# Patient Record
Sex: Female | Born: 1950 | Race: White | Hispanic: No | Smoking: Current every day smoker
Health system: Southern US, Community
[De-identification: ages and names within clinical notes are randomized; demographics above are authoritative.]

## PROBLEM LIST (undated history)

## (undated) DIAGNOSIS — Z923 Personal history of irradiation: Secondary | ICD-10-CM

## (undated) DIAGNOSIS — E063 Autoimmune thyroiditis: Secondary | ICD-10-CM

## (undated) DIAGNOSIS — E119 Type 2 diabetes mellitus without complications: Secondary | ICD-10-CM

## (undated) DIAGNOSIS — E785 Hyperlipidemia, unspecified: Secondary | ICD-10-CM

## (undated) DIAGNOSIS — Z9221 Personal history of antineoplastic chemotherapy: Secondary | ICD-10-CM

## (undated) DIAGNOSIS — Z806 Family history of leukemia: Secondary | ICD-10-CM

## (undated) DIAGNOSIS — I4891 Unspecified atrial fibrillation: Secondary | ICD-10-CM

## (undated) DIAGNOSIS — E038 Other specified hypothyroidism: Secondary | ICD-10-CM

## (undated) DIAGNOSIS — I499 Cardiac arrhythmia, unspecified: Secondary | ICD-10-CM

## (undated) DIAGNOSIS — Z803 Family history of malignant neoplasm of breast: Secondary | ICD-10-CM

## (undated) DIAGNOSIS — Z8042 Family history of malignant neoplasm of prostate: Secondary | ICD-10-CM

## (undated) DIAGNOSIS — I10 Essential (primary) hypertension: Secondary | ICD-10-CM

## (undated) HISTORY — PX: EYE SURGERY: SHX253

## (undated) HISTORY — DX: Unspecified atrial fibrillation: I48.91

## (undated) HISTORY — DX: Other specified hypothyroidism: E03.8

## (undated) HISTORY — DX: Family history of malignant neoplasm of breast: Z80.3

## (undated) HISTORY — PX: APPENDECTOMY: SHX54

## (undated) HISTORY — PX: KNEE SURGERY: SHX244

## (undated) HISTORY — DX: Family history of malignant neoplasm of prostate: Z80.42

## (undated) HISTORY — DX: Hyperlipidemia, unspecified: E78.5

## (undated) HISTORY — DX: Family history of leukemia: Z80.6

## (undated) HISTORY — DX: Essential (primary) hypertension: I10

## (undated) HISTORY — PX: BREAST SURGERY: SHX581

## (undated) HISTORY — DX: Autoimmune thyroiditis: E06.3

## (undated) HISTORY — PX: ABDOMINAL HYSTERECTOMY: SHX81

## (undated) HISTORY — PX: CHOLECYSTECTOMY: SHX55

## (undated) HISTORY — DX: Type 2 diabetes mellitus without complications: E11.9

---

## 1997-08-29 ENCOUNTER — Other Ambulatory Visit: Admission: RE | Admit: 1997-08-29 | Discharge: 1997-08-29 | Payer: Self-pay | Admitting: Obstetrics & Gynecology

## 1998-09-29 ENCOUNTER — Other Ambulatory Visit: Admission: RE | Admit: 1998-09-29 | Discharge: 1998-09-29 | Payer: Self-pay | Admitting: Obstetrics & Gynecology

## 2003-11-11 ENCOUNTER — Ambulatory Visit (HOSPITAL_BASED_OUTPATIENT_CLINIC_OR_DEPARTMENT_OTHER): Admission: RE | Admit: 2003-11-11 | Discharge: 2003-11-11 | Payer: Self-pay | Admitting: Orthopedic Surgery

## 2004-06-09 ENCOUNTER — Other Ambulatory Visit: Admission: RE | Admit: 2004-06-09 | Discharge: 2004-06-09 | Payer: Self-pay | Admitting: Obstetrics & Gynecology

## 2015-02-13 DIAGNOSIS — I480 Paroxysmal atrial fibrillation: Secondary | ICD-10-CM | POA: Insufficient documentation

## 2015-02-13 DIAGNOSIS — E785 Hyperlipidemia, unspecified: Secondary | ICD-10-CM | POA: Insufficient documentation

## 2015-02-13 HISTORY — DX: Hyperlipidemia, unspecified: E78.5

## 2015-02-13 HISTORY — DX: Paroxysmal atrial fibrillation: I48.0

## 2015-05-23 DIAGNOSIS — E119 Type 2 diabetes mellitus without complications: Secondary | ICD-10-CM

## 2015-05-23 DIAGNOSIS — E038 Other specified hypothyroidism: Secondary | ICD-10-CM

## 2015-05-23 DIAGNOSIS — E063 Autoimmune thyroiditis: Secondary | ICD-10-CM

## 2015-05-23 HISTORY — DX: Other specified hypothyroidism: E03.8

## 2015-05-23 HISTORY — DX: Autoimmune thyroiditis: E06.3

## 2015-05-23 HISTORY — DX: Type 2 diabetes mellitus without complications: E11.9

## 2015-08-25 DIAGNOSIS — I10 Essential (primary) hypertension: Secondary | ICD-10-CM

## 2015-08-25 HISTORY — DX: Essential (primary) hypertension: I10

## 2015-12-01 DIAGNOSIS — F331 Major depressive disorder, recurrent, moderate: Secondary | ICD-10-CM | POA: Insufficient documentation

## 2015-12-01 HISTORY — DX: Major depressive disorder, recurrent, moderate: F33.1

## 2016-10-14 ENCOUNTER — Telehealth: Payer: Self-pay | Admitting: Cardiology

## 2016-10-14 DIAGNOSIS — I480 Paroxysmal atrial fibrillation: Secondary | ICD-10-CM

## 2016-10-14 NOTE — Telephone Encounter (Signed)
Order placed. Result will be routed to coumadin clinic once received.

## 2016-10-14 NOTE — Telephone Encounter (Signed)
Patient needs her PT drawn and in [place] and would like it sent to her home to have her husband bring it with him when he goes. She will be going to a [facility] there in [place] .

## 2016-11-02 ENCOUNTER — Telehealth: Payer: Self-pay

## 2016-11-02 LAB — POCT INR: INR: 2.7

## 2016-11-02 NOTE — Telephone Encounter (Signed)
P/c with patient needs standing order for pt/inr to be faxed to [facility] # [phone]. thanks

## 2016-11-02 NOTE — Telephone Encounter (Signed)
Message will be routed to coumadin clinic for management of warfarin.

## 2016-11-02 NOTE — Telephone Encounter (Signed)
Order has been faxed

## 2016-11-08 ENCOUNTER — Ambulatory Visit (INDEPENDENT_AMBULATORY_CARE_PROVIDER_SITE_OTHER): Payer: Self-pay | Admitting: Pharmacist

## 2016-11-08 DIAGNOSIS — Z5181 Encounter for therapeutic drug level monitoring: Secondary | ICD-10-CM

## 2016-11-08 DIAGNOSIS — I4891 Unspecified atrial fibrillation: Secondary | ICD-10-CM | POA: Insufficient documentation

## 2016-11-08 DIAGNOSIS — Z7901 Long term (current) use of anticoagulants: Secondary | ICD-10-CM | POA: Insufficient documentation

## 2016-11-08 HISTORY — DX: Long term (current) use of anticoagulants: Z79.01

## 2016-11-08 HISTORY — DX: Unspecified atrial fibrillation: I48.91

## 2016-11-08 HISTORY — DX: Encounter for therapeutic drug level monitoring: Z51.81

## 2016-11-10 NOTE — Progress Notes (Signed)
Sent to [place], Therapist, sports to address. [place] patient.  Thanks!

## 2016-12-08 DIAGNOSIS — Z789 Other specified health status: Secondary | ICD-10-CM | POA: Insufficient documentation

## 2016-12-08 HISTORY — DX: Other specified health status: Z78.9

## 2016-12-10 ENCOUNTER — Other Ambulatory Visit: Payer: Self-pay | Admitting: Cardiology

## 2016-12-11 LAB — PROTIME-INR
INR: 2.4 — AB (ref 0.8–1.2)
PROTHROMBIN TIME: 24 s — AB (ref 9.1–12.0)

## 2016-12-14 ENCOUNTER — Ambulatory Visit (INDEPENDENT_AMBULATORY_CARE_PROVIDER_SITE_OTHER): Payer: BLUE CROSS/BLUE SHIELD | Admitting: Cardiology

## 2016-12-14 DIAGNOSIS — Z7901 Long term (current) use of anticoagulants: Secondary | ICD-10-CM | POA: Diagnosis not present

## 2016-12-14 DIAGNOSIS — Z5181 Encounter for therapeutic drug level monitoring: Secondary | ICD-10-CM | POA: Diagnosis not present

## 2016-12-14 DIAGNOSIS — I4891 Unspecified atrial fibrillation: Secondary | ICD-10-CM

## 2017-01-25 ENCOUNTER — Other Ambulatory Visit: Payer: Self-pay | Admitting: Cardiology

## 2017-01-25 LAB — PROTIME-INR: INR: 4.1 — AB (ref <=1.1)

## 2017-01-26 ENCOUNTER — Ambulatory Visit (INDEPENDENT_AMBULATORY_CARE_PROVIDER_SITE_OTHER): Payer: BLUE CROSS/BLUE SHIELD

## 2017-01-26 ENCOUNTER — Telehealth: Payer: Self-pay | Admitting: Cardiology

## 2017-01-26 DIAGNOSIS — I4891 Unspecified atrial fibrillation: Secondary | ICD-10-CM | POA: Diagnosis not present

## 2017-01-26 DIAGNOSIS — Z5181 Encounter for therapeutic drug level monitoring: Secondary | ICD-10-CM

## 2017-01-26 DIAGNOSIS — Z7901 Long term (current) use of anticoagulants: Secondary | ICD-10-CM

## 2017-01-26 LAB — PROTIME-INR
INR: 4.1 — ABNORMAL HIGH (ref 0.8–1.2)
Prothrombin Time: 39.4 s — ABNORMAL HIGH (ref 9.1–12.0)

## 2017-01-26 NOTE — Telephone Encounter (Signed)
Patient is wanting to know if she can have a standing order for her PT to be drawn at [facility] in [place] so she doesn't ave to go to [facility].Marland Kitchen

## 2017-01-27 NOTE — Telephone Encounter (Signed)
Message has been sent to the coumadin clinic for further advise.

## 2017-02-10 ENCOUNTER — Other Ambulatory Visit: Payer: Self-pay

## 2017-02-10 DIAGNOSIS — I480 Paroxysmal atrial fibrillation: Secondary | ICD-10-CM

## 2017-02-18 LAB — PROTIME-INR
INR: 2.1 — ABNORMAL HIGH (ref 0.8–1.2)
Prothrombin Time: 20.8 s — ABNORMAL HIGH (ref 9.1–12.0)

## 2017-02-21 ENCOUNTER — Ambulatory Visit (INDEPENDENT_AMBULATORY_CARE_PROVIDER_SITE_OTHER): Payer: BLUE CROSS/BLUE SHIELD | Admitting: Pharmacist

## 2017-02-21 DIAGNOSIS — I4891 Unspecified atrial fibrillation: Secondary | ICD-10-CM

## 2017-02-21 DIAGNOSIS — Z7901 Long term (current) use of anticoagulants: Secondary | ICD-10-CM | POA: Diagnosis not present

## 2017-02-21 DIAGNOSIS — Z5181 Encounter for therapeutic drug level monitoring: Secondary | ICD-10-CM

## 2017-03-09 ENCOUNTER — Other Ambulatory Visit: Payer: Self-pay | Admitting: Cardiology

## 2017-03-09 MED ORDER — WARFARIN SODIUM 10 MG PO TABS: ORAL_TABLET | ORAL | 0 refills | Status: DC

## 2017-03-09 MED ORDER — WARFARIN SODIUM 10 MG PO TABS: ORAL_TABLET | ORAL | 1 refills | Status: DC

## 2017-03-09 NOTE — Telephone Encounter (Signed)
Sent in rx to mail order pharmacy and local pharmacy as requested.

## 2017-03-09 NOTE — Telephone Encounter (Signed)
Routed to coumadin clinic

## 2017-03-09 NOTE — Telephone Encounter (Signed)
°*  STAT* If patient is at the pharmacy, call can be transferred to refill team.   1. Which medications need to be refilled? (please list name of each medication and dose if known) Warfarin 10mg   2. Which pharmacy/location (including street and city if local pharmacy) is medication to be sent to? [facility]   3. Do they need a 30 day or 90 day supply? 90  Patient not due to see RJK til the Spring of 2019. [facility] was supposed to send refill but must have sent to the old office.    PLEASE CALL in SOME Warfarin to last 15 days to [facility]!! She is completely out!

## 2017-03-28 ENCOUNTER — Other Ambulatory Visit: Payer: Self-pay | Admitting: Cardiology

## 2017-03-29 ENCOUNTER — Other Ambulatory Visit: Payer: Self-pay

## 2017-03-29 ENCOUNTER — Telehealth: Payer: Self-pay | Admitting: *Deleted

## 2017-03-29 DIAGNOSIS — I4891 Unspecified atrial fibrillation: Secondary | ICD-10-CM

## 2017-03-29 DIAGNOSIS — Z7901 Long term (current) use of anticoagulants: Secondary | ICD-10-CM

## 2017-03-29 NOTE — Telephone Encounter (Signed)
Spoke with pt and asked if she had been able to have INR done that it was scheduled to be done on 03/14/2017  She states that she went to lab and they said did not have an order.This nurse called [place] office and spoke with [employer] and she states she will get Dr Agustin Cree to send [facility] so that she can get INR done tomorrow. This nurse called pt and she states she will go tomorrow to get INR checked

## 2017-03-31 LAB — PROTIME-INR
INR: 3.5 — AB (ref <=1.1)
INR: 3.5 — ABNORMAL HIGH (ref 0.8–1.2)
PROTHROMBIN TIME: 35.1 s — AB (ref 9.1–12.0)

## 2017-04-06 ENCOUNTER — Ambulatory Visit (INDEPENDENT_AMBULATORY_CARE_PROVIDER_SITE_OTHER): Payer: BLUE CROSS/BLUE SHIELD

## 2017-04-06 ENCOUNTER — Telehealth: Payer: Self-pay | Admitting: Pharmacist

## 2017-04-06 DIAGNOSIS — I4891 Unspecified atrial fibrillation: Secondary | ICD-10-CM

## 2017-04-06 DIAGNOSIS — Z7901 Long term (current) use of anticoagulants: Secondary | ICD-10-CM

## 2017-04-06 DIAGNOSIS — Z5181 Encounter for therapeutic drug level monitoring: Secondary | ICD-10-CM | POA: Diagnosis not present

## 2017-04-06 NOTE — Telephone Encounter (Signed)
Form signed and faxed back. Sent to medical records for scanning into system.

## 2017-04-06 NOTE — Patient Instructions (Signed)
Called spoke with pt, advised to skip today's dosage of Coumadin, then resume warfarin 10mg  daily. Recheck INR in 2 weeks.  ([facility])

## 2017-04-06 NOTE — Telephone Encounter (Signed)
Spoke with Sabrina [name], [facility] representative and verified active standing order on file until June 2019. She states that once order is active they will need signature on their form faxed back. She will send form to my attention to be signed and faxed back.

## 2017-04-20 ENCOUNTER — Other Ambulatory Visit: Payer: Self-pay | Admitting: Cardiology

## 2017-04-20 ENCOUNTER — Ambulatory Visit (INDEPENDENT_AMBULATORY_CARE_PROVIDER_SITE_OTHER): Payer: BLUE CROSS/BLUE SHIELD | Admitting: Internal Medicine

## 2017-04-20 DIAGNOSIS — I4891 Unspecified atrial fibrillation: Secondary | ICD-10-CM | POA: Diagnosis not present

## 2017-04-20 DIAGNOSIS — Z5181 Encounter for therapeutic drug level monitoring: Secondary | ICD-10-CM

## 2017-04-20 DIAGNOSIS — Z7901 Long term (current) use of anticoagulants: Secondary | ICD-10-CM

## 2017-04-20 LAB — PROTIME-INR
INR: 3 — ABNORMAL HIGH (ref 0.8–1.2)
PROTHROMBIN TIME: 31 s — AB (ref 9.1–12.0)

## 2017-05-13 ENCOUNTER — Ambulatory Visit (INDEPENDENT_AMBULATORY_CARE_PROVIDER_SITE_OTHER): Payer: BLUE CROSS/BLUE SHIELD | Admitting: Pharmacist

## 2017-05-13 ENCOUNTER — Other Ambulatory Visit: Payer: Self-pay | Admitting: Cardiology

## 2017-05-13 DIAGNOSIS — Z7901 Long term (current) use of anticoagulants: Secondary | ICD-10-CM | POA: Diagnosis not present

## 2017-05-13 DIAGNOSIS — I4891 Unspecified atrial fibrillation: Secondary | ICD-10-CM | POA: Diagnosis not present

## 2017-05-13 DIAGNOSIS — Z5181 Encounter for therapeutic drug level monitoring: Secondary | ICD-10-CM

## 2017-05-13 LAB — PROTIME-INR
INR: 2.7 — ABNORMAL HIGH (ref 0.8–1.2)
Prothrombin Time: 28 s — ABNORMAL HIGH (ref 9.1–12.0)

## 2017-06-21 ENCOUNTER — Other Ambulatory Visit: Payer: Self-pay | Admitting: Cardiology

## 2017-06-21 ENCOUNTER — Ambulatory Visit (INDEPENDENT_AMBULATORY_CARE_PROVIDER_SITE_OTHER): Payer: BLUE CROSS/BLUE SHIELD | Admitting: Interventional Cardiology

## 2017-06-21 DIAGNOSIS — Z5181 Encounter for therapeutic drug level monitoring: Secondary | ICD-10-CM | POA: Diagnosis not present

## 2017-06-21 DIAGNOSIS — I4891 Unspecified atrial fibrillation: Secondary | ICD-10-CM

## 2017-06-21 DIAGNOSIS — Z7901 Long term (current) use of anticoagulants: Secondary | ICD-10-CM

## 2017-06-21 LAB — PROTIME-INR
INR: 3.2 — ABNORMAL HIGH (ref 0.8–1.2)
PROTHROMBIN TIME: 33.2 s — AB (ref 9.1–12.0)

## 2017-06-21 NOTE — Patient Instructions (Signed)
Description   Spoke with pt do not take coumadin today March 12th then  continue same dose of  warfarin 10mg  daily. Recheck INR in 2 weeks.  ([facility])

## 2017-07-04 ENCOUNTER — Other Ambulatory Visit: Payer: Self-pay | Admitting: Cardiology

## 2017-07-04 LAB — PROTIME-INR: INR: 3.8 — AB (ref 0.9–1.1)

## 2017-07-05 ENCOUNTER — Ambulatory Visit (INDEPENDENT_AMBULATORY_CARE_PROVIDER_SITE_OTHER): Payer: BLUE CROSS/BLUE SHIELD | Admitting: Interventional Cardiology

## 2017-07-05 DIAGNOSIS — I4891 Unspecified atrial fibrillation: Secondary | ICD-10-CM | POA: Diagnosis not present

## 2017-07-05 DIAGNOSIS — Z7901 Long term (current) use of anticoagulants: Secondary | ICD-10-CM

## 2017-07-05 DIAGNOSIS — Z5181 Encounter for therapeutic drug level monitoring: Secondary | ICD-10-CM | POA: Diagnosis not present

## 2017-07-05 LAB — PROTIME-INR
INR: 3.8 — ABNORMAL HIGH (ref 0.8–1.2)
Prothrombin Time: 36.8 s — ABNORMAL HIGH (ref 9.1–12.0)

## 2017-07-12 ENCOUNTER — Encounter: Payer: Self-pay | Admitting: Cardiology

## 2017-07-12 ENCOUNTER — Ambulatory Visit: Payer: BLUE CROSS/BLUE SHIELD | Admitting: Cardiology

## 2017-07-12 ENCOUNTER — Encounter: Payer: Self-pay | Admitting: *Deleted

## 2017-07-12 VITALS — BP 134/70 | HR 68 | Ht 70.0 in | Wt 182.0 lb

## 2017-07-12 DIAGNOSIS — Z789 Other specified health status: Secondary | ICD-10-CM

## 2017-07-12 DIAGNOSIS — IMO0001 Reserved for inherently not codable concepts without codable children: Secondary | ICD-10-CM | POA: Insufficient documentation

## 2017-07-12 DIAGNOSIS — Z7901 Long term (current) use of anticoagulants: Secondary | ICD-10-CM | POA: Diagnosis not present

## 2017-07-12 DIAGNOSIS — I10 Essential (primary) hypertension: Secondary | ICD-10-CM | POA: Diagnosis not present

## 2017-07-12 DIAGNOSIS — I480 Paroxysmal atrial fibrillation: Secondary | ICD-10-CM | POA: Diagnosis not present

## 2017-07-12 DIAGNOSIS — E119 Type 2 diabetes mellitus without complications: Secondary | ICD-10-CM

## 2017-07-12 DIAGNOSIS — E785 Hyperlipidemia, unspecified: Secondary | ICD-10-CM

## 2017-07-12 DIAGNOSIS — F17200 Nicotine dependence, unspecified, uncomplicated: Secondary | ICD-10-CM | POA: Insufficient documentation

## 2017-07-12 HISTORY — DX: Reserved for inherently not codable concepts without codable children: IMO0001

## 2017-07-12 HISTORY — DX: Nicotine dependence, unspecified, uncomplicated: F17.200

## 2017-07-12 NOTE — Patient Instructions (Signed)
Medication Instructions:  Your physician recommends that you continue on your current medications as directed. Please refer to the Current Medication list given to you today.   Labwork: None  Testing/Procedures: You had an EKG today.   Your physician has requested that you have an echocardiogram. Echocardiography is a painless test that uses sound waves to create images of your heart. It provides your doctor with information about the size and shape of your heart and how well your heart's chambers and valves are working. This procedure takes approximately one hour. There are no restrictions for this procedure.  Your physician has requested that you have a carotid ultrasound. This test is an ultrasound of the carotid arteries in your neck. It looks at blood flow through these arteries that supply the brain with blood. Allow one hour for this exam. There are no restrictions or special instructions.    Follow-Up: Your physician wants you to follow-up in: 1 year. You will receive a reminder letter in the mail two months in advance. If you don't receive a letter, please call our office to schedule the follow-up appointment.   Any Other Special Instructions Will Be Listed Below (If Applicable).     If you need a refill on your cardiac medications before your next appointment, please call your pharmacy.

## 2017-07-12 NOTE — Progress Notes (Signed)
Cardiology Office Note:    Date:  07/12/2017   ID:  Sabrina [name], DOB Apr 09, 1951, MRN [id number]  PCP:  Patient, No Pcp Per  Cardiologist:  Jenne Campus, MD    Referring MD: No ref. provider found   Chief Complaint  Patient presents with  . Follow-up  Doing well  History of Present Illness:    Sabrina [name] is a 67 y.o. female who is a chronic smoker with proximal atrial fibrillation.  She is anticoagulated with her chads 2 Vascor equals 3.  She takes Coumadin.  We talked again about switching her to a newer agent however she wants to continue with Coumadin.  Sadly she still continues to smoke about 2 packs/day I will have multiple discussion about this she actually was able to quit one time for 3 months but then went back to it and she told me straight that she will not be able to quit.  I told her how important would be to quit but again I do not think she will be able to do it.  Described to have some shortness of breath and fatigue while walking.  It is a springtime and weather is getting better she became a little more active.  Does have any chest pain tightness squeezing pressure burning chest.  Switch primary care physicians she see Dr. Mitzi [name]  now apparently recently he check her cholesterol which was good.  Will call his office to get a copy of it  Past Medical History:  Diagnosis Date  . Atrial fibrillation ([place]) [I48.91] 11/08/2016  . Benign essential hypertension 08/25/2015  . Hyperlipidemia   . Hypothyroidism due to Hashimoto's thyroiditis 05/23/2015      Current Medications: Current Meds  Medication Sig  . digoxin (DIGOX) 0.125 MG tablet Take 125 mcg by mouth daily.  Marland Kitchen escitalopram (LEXAPRO) 10 MG tablet Take 1 tablet by mouth daily.  . flecainide (TAMBOCOR) 100 MG tablet Take 100 mg by mouth 2 (two) times daily.  Marland Kitchen levothyroxine (SYNTHROID, LEVOTHROID) 100 MCG tablet Take 1 tablet by mouth daily.  Marland Kitchen lisinopril (PRINIVIL,ZESTRIL) 10 MG tablet Take 1 tablet by  mouth daily.  . metFORMIN (GLUCOPHAGE) 500 MG tablet Take 1 tablet by mouth daily.  . metoprolol succinate (TOPROL-XL) 50 MG 24 hr tablet Take 1 tablet by mouth daily.  Marland Kitchen warfarin (COUMADIN) 10 MG tablet Take as directed by Coumadin Clinic     Allergies:   Atorvastatin and Meperidine   Social History   Socioeconomic History  . Marital status: Married    Spouse name: Not on file  . Number of children: Not on file  . Years of education: Not on file  . Highest education level: Not on file  Occupational History  . Not on file  Social Needs  . Financial resource strain: Not on file  . Food insecurity:    Worry: Not on file    Inability: Not on file  . Transportation needs:    Medical: Not on file    Non-medical: Not on file  Tobacco Use  . Smoking status: Current Every Day Smoker  . Smokeless tobacco: Never Used  Substance and Sexual Activity  . Alcohol use: Never    Frequency: Never  . Drug use: Never  . Sexual activity: Not on file  Lifestyle  . Physical activity:    Days per week: Not on file    Minutes per session: Not on file  . Stress: Not on file  Relationships  . Social connections:  Talks on phone: Not on file    Gets together: Not on file    Attends religious service: Not on file    Active member of club or organization: Not on file    Attends meetings of clubs or organizations: Not on file    Relationship status: Not on file  Other Topics Concern  . Not on file  Social History Narrative  . Not on file     Family History: The patient's family history includes Alcohol abuse in her maternal aunt, maternal uncle, and paternal uncle; Cancer in her brother, father, maternal uncle, and mother; Diabetes in her maternal uncle, paternal uncle, and son; Hypertension in her maternal aunt and maternal grandmother. ROS:   Please see the history of present illness.    All 14 point review of systems negative except as described per history of present  illness  EKGs/Labs/Other Studies Reviewed:      Recent Labs: No results found for requested labs within last 8760 hours.  Recent Lipid Panel No results found for: CHOL, TRIG, HDL, CHOLHDL, VLDL, LDLCALC, LDLDIRECT  Physical Exam:    VS:  BP 134/70   Pulse 68   Ht 5\' 10"  (1.778 m)   Wt 182 lb (82.6 kg)   SpO2 95%   BMI 26.11 kg/m     Wt Readings from Last 3 Encounters:  07/12/17 182 lb (82.6 kg)     GEN:  Well nourished, well developed in no acute distress HEENT: Normal NECK: No JVD; No carotid bruits LYMPHATICS: No lymphadenopathy CARDIAC: RRR, no murmurs, no rubs, no gallops RESPIRATORY:  Clear to auscultation without rales, wheezing or rhonchi  ABDOMEN: Soft, non-tender, non-distended MUSCULOSKELETAL:  No edema; No deformity  SKIN: Warm and dry LOWER EXTREMITIES: no swelling NEUROLOGIC:  Alert and oriented x 3 PSYCHIATRIC:  Normal affect   ASSESSMENT:    1. Paroxysmal atrial fibrillation (HCC)   2. Benign essential hypertension   3. Type 2 diabetes mellitus without complication, without long-term current use of insulin ([place])   4. Dyslipidemia   5. Long term (current) use of anticoagulants [Z79.01]   6. Statin intolerance   7. Smoking    PLAN:    In order of problems listed above:  1. Paroxysmal atrial fibrillation appears to be maintaining sinus rhythm.  I will ask her to have echocardiogram however she wants to wait until our office in [place] will be open therefore we will schedule her to have it done in about 4 months. 2. Benign essential hypertension doing well from that point of view we will continue present management. 3. Diabetes type 2.  Stable from that point review 4. Dyslipidemia: Intolerant to statin.  Will call primary care physician to get her fasting lipid profile. 5. Long-term anticoagulation: We will continue. 6. Smoking: We spent a great of time talking to her about the importance of taking steps to quit smoking however she demonstrated  she will not be able to do that because of multiple risk factors for coronary artery disease and atherosclerosis I will schedule her to have carotid ultrasounds but again she wants to wait until you back in [place].   Medication Adjustments/Labs and Tests Ordered: Current medicines are reviewed at length with the patient today.  Concerns regarding medicines are outlined above.  No orders of the defined types were placed in this encounter.  Medication changes: No orders of the defined types were placed in this encounter.   Signed, Park Liter, MD, [facility] 07/12/2017 10:14 AM  [facility]

## 2017-07-19 ENCOUNTER — Other Ambulatory Visit: Payer: Self-pay | Admitting: Cardiology

## 2017-07-19 ENCOUNTER — Ambulatory Visit (INDEPENDENT_AMBULATORY_CARE_PROVIDER_SITE_OTHER): Payer: BLUE CROSS/BLUE SHIELD | Admitting: Cardiology

## 2017-07-19 DIAGNOSIS — I480 Paroxysmal atrial fibrillation: Secondary | ICD-10-CM

## 2017-07-19 DIAGNOSIS — Z7901 Long term (current) use of anticoagulants: Secondary | ICD-10-CM

## 2017-07-19 DIAGNOSIS — Z5181 Encounter for therapeutic drug level monitoring: Secondary | ICD-10-CM

## 2017-07-19 LAB — PROTIME-INR
INR: 3 — ABNORMAL HIGH (ref 0.8–1.2)
Prothrombin Time: 30.9 s — ABNORMAL HIGH (ref 9.1–12.0)

## 2017-07-19 NOTE — Patient Instructions (Signed)
Description   Spoke with pt. Instructed to continue  taking 10mg  daily except 5mg  on Wednesdays. Continue   eating 2 servings of leafy green vegetables each week.  Recheck INR in 2 weeks.  ([facility]) Pt is out of town until April 29th

## 2017-08-08 ENCOUNTER — Other Ambulatory Visit: Payer: Self-pay | Admitting: Cardiology

## 2017-08-08 ENCOUNTER — Ambulatory Visit (INDEPENDENT_AMBULATORY_CARE_PROVIDER_SITE_OTHER): Payer: BLUE CROSS/BLUE SHIELD | Admitting: Pharmacist

## 2017-08-08 DIAGNOSIS — Z5181 Encounter for therapeutic drug level monitoring: Secondary | ICD-10-CM | POA: Diagnosis not present

## 2017-08-08 DIAGNOSIS — I480 Paroxysmal atrial fibrillation: Secondary | ICD-10-CM

## 2017-08-08 DIAGNOSIS — Z7901 Long term (current) use of anticoagulants: Secondary | ICD-10-CM | POA: Diagnosis not present

## 2017-08-08 LAB — PROTIME-INR
INR: 3.9 — AB (ref 0.8–1.2)
Prothrombin Time: 40.2 s — ABNORMAL HIGH (ref 9.1–12.0)

## 2017-08-26 ENCOUNTER — Telehealth: Payer: Self-pay | Admitting: Cardiology

## 2017-08-26 ENCOUNTER — Other Ambulatory Visit: Payer: Self-pay

## 2017-08-26 MED ORDER — FLECAINIDE ACETATE 100 MG PO TABS: 100.0000 mg | ORAL_TABLET | Freq: Two times a day (BID) | ORAL | 3 refills | Status: DC

## 2017-08-26 MED ORDER — DIGOXIN 125 MCG PO TABS: 125.0000 ug | ORAL_TABLET | Freq: Every day | ORAL | 3 refills | Status: DC

## 2017-08-26 MED ORDER — LISINOPRIL 10 MG PO TABS: 10.0000 mg | ORAL_TABLET | Freq: Every day | ORAL | 3 refills | Status: DC

## 2017-08-26 MED ORDER — METOPROLOL SUCCINATE ER 50 MG PO TB24: 50.0000 mg | ORAL_TABLET | Freq: Every day | ORAL | 3 refills | Status: DC

## 2017-08-26 NOTE — Telephone Encounter (Signed)
Please call about switching insurance and changing all meds to this

## 2017-08-26 NOTE — Telephone Encounter (Signed)
[facility] request of patient.

## 2017-08-26 NOTE — Telephone Encounter (Signed)
Patient will call her insurance to find out which mail service needs to be added to her chart.

## 2017-08-29 ENCOUNTER — Ambulatory Visit (INDEPENDENT_AMBULATORY_CARE_PROVIDER_SITE_OTHER): Payer: Medicare Other | Admitting: Cardiology

## 2017-08-29 ENCOUNTER — Other Ambulatory Visit: Payer: Self-pay | Admitting: Cardiology

## 2017-08-29 DIAGNOSIS — I480 Paroxysmal atrial fibrillation: Secondary | ICD-10-CM | POA: Diagnosis not present

## 2017-08-29 DIAGNOSIS — Z5181 Encounter for therapeutic drug level monitoring: Secondary | ICD-10-CM | POA: Diagnosis not present

## 2017-08-29 DIAGNOSIS — Z7901 Long term (current) use of anticoagulants: Secondary | ICD-10-CM

## 2017-08-29 LAB — PROTIME-INR
INR: 3.6 — ABNORMAL HIGH (ref 0.8–1.2)
PROTHROMBIN TIME: 37.1 s — AB (ref 9.1–12.0)

## 2017-08-29 NOTE — Patient Instructions (Signed)
Description   Spoke with pt. Instructed to skip dose today May 20th  then change dose to 10mg  daily except 5mg  on Mondays, Wednesdays and Saturdays. Continue   eating 2 servings of leafy green vegetables each week.  Recheck INR in 2 weeks.  ([facility])

## 2017-08-31 ENCOUNTER — Telehealth: Payer: Self-pay | Admitting: Cardiology

## 2017-08-31 NOTE — Telephone Encounter (Signed)
Please call about changing prescription plans

## 2017-09-01 ENCOUNTER — Other Ambulatory Visit: Payer: Self-pay

## 2017-09-01 MED ORDER — WARFARIN SODIUM 10 MG PO TABS: ORAL_TABLET | ORAL | 1 refills | Status: DC

## 2017-09-01 NOTE — Telephone Encounter (Signed)
Patient called back stating since Dr. Agustin Cree is her primary cardiologist and is not at the [facility] office the Coumadin clinic would not refill her warfarin. Caryl Pina, RN in charge of our coumadin patients here at the [facility] office refilled this medication and informed the patient of this. No further questions.

## 2017-09-01 NOTE — Telephone Encounter (Signed)
Patient states that she did not receive her [facility]farin [facility]. Patient has PT/INR checked at [facility] in [place] and it is managed by the Coumadin Clinic at the [facility] office. Patient advised to call the coumadin clinic for further instructions regarding refills. Phone number was provided to patient. Patient verbalized understanding. No further questions.

## 2017-09-06 ENCOUNTER — Other Ambulatory Visit: Payer: Self-pay

## 2017-09-06 MED ORDER — WARFARIN SODIUM 10 MG PO TABS: ORAL_TABLET | ORAL | 1 refills | Status: DC

## 2017-09-13 ENCOUNTER — Other Ambulatory Visit: Payer: Self-pay | Admitting: Cardiology

## 2017-09-14 ENCOUNTER — Ambulatory Visit (INDEPENDENT_AMBULATORY_CARE_PROVIDER_SITE_OTHER): Payer: Medicare Other | Admitting: Interventional Cardiology

## 2017-09-14 DIAGNOSIS — Z5181 Encounter for therapeutic drug level monitoring: Secondary | ICD-10-CM

## 2017-09-14 DIAGNOSIS — I480 Paroxysmal atrial fibrillation: Secondary | ICD-10-CM | POA: Diagnosis not present

## 2017-09-14 DIAGNOSIS — Z7901 Long term (current) use of anticoagulants: Secondary | ICD-10-CM

## 2017-09-14 LAB — PROTIME-INR
INR: 2.1 — AB (ref 0.8–1.2)
Prothrombin Time: 20.7 s — ABNORMAL HIGH (ref 9.1–12.0)

## 2017-09-14 NOTE — Patient Instructions (Signed)
Description   Spoke with pt. Instructed to continue same of coumadin   10mg  daily except 5mg  on Mondays, Wednesdays and Saturdays. Continue   eating 2 servings of leafy green vegetables each week.  Recheck INR in 3 weeks.  ([facility])

## 2017-09-29 ENCOUNTER — Encounter (HOSPITAL_BASED_OUTPATIENT_CLINIC_OR_DEPARTMENT_OTHER): Payer: BLUE CROSS/BLUE SHIELD

## 2017-10-06 ENCOUNTER — Telehealth: Payer: Self-pay | Admitting: *Deleted

## 2017-10-06 NOTE — Telephone Encounter (Signed)
Telephoned pt to ask her if she was able to get her INR done on 10/04/17. She states she forgot [place] into [place] and want be able to go until Monday. Explained to her that will be a week past due and the risk but she states she want be able to go until Monday.

## 2017-10-10 ENCOUNTER — Other Ambulatory Visit: Payer: Self-pay | Admitting: Cardiology

## 2017-10-10 ENCOUNTER — Other Ambulatory Visit: Payer: Self-pay

## 2017-10-10 ENCOUNTER — Telehealth: Payer: Self-pay | Admitting: Cardiology

## 2017-10-10 DIAGNOSIS — I480 Paroxysmal atrial fibrillation: Secondary | ICD-10-CM

## 2017-10-10 NOTE — Telephone Encounter (Signed)
Order as been extended.

## 2017-10-10 NOTE — Telephone Encounter (Signed)
Patient needs a lab order in for her PT for the [facility]. Please fax to [phone].

## 2017-10-11 LAB — PROTIME-INR
INR: 2 — ABNORMAL HIGH (ref 0.8–1.2)
Prothrombin Time: 19.6 s — ABNORMAL HIGH (ref 9.1–12.0)

## 2017-10-18 ENCOUNTER — Ambulatory Visit (INDEPENDENT_AMBULATORY_CARE_PROVIDER_SITE_OTHER): Payer: Medicare Other | Admitting: Internal Medicine

## 2017-10-18 DIAGNOSIS — I480 Paroxysmal atrial fibrillation: Secondary | ICD-10-CM

## 2017-10-18 DIAGNOSIS — Z5181 Encounter for therapeutic drug level monitoring: Secondary | ICD-10-CM

## 2017-10-18 DIAGNOSIS — Z7901 Long term (current) use of anticoagulants: Secondary | ICD-10-CM

## 2017-11-04 ENCOUNTER — Other Ambulatory Visit: Payer: Medicare Other

## 2017-11-10 ENCOUNTER — Other Ambulatory Visit: Payer: Self-pay

## 2017-11-10 DIAGNOSIS — I480 Paroxysmal atrial fibrillation: Secondary | ICD-10-CM

## 2017-11-10 LAB — PROTIME-INR
INR: 2.1 — AB (ref 0.8–1.2)
Prothrombin Time: 21 s — ABNORMAL HIGH (ref 9.1–12.0)

## 2017-11-14 ENCOUNTER — Telehealth: Payer: Self-pay

## 2017-11-14 ENCOUNTER — Ambulatory Visit (INDEPENDENT_AMBULATORY_CARE_PROVIDER_SITE_OTHER): Payer: Medicare Other

## 2017-11-14 DIAGNOSIS — I480 Paroxysmal atrial fibrillation: Secondary | ICD-10-CM

## 2017-11-14 DIAGNOSIS — Z7901 Long term (current) use of anticoagulants: Secondary | ICD-10-CM

## 2017-11-14 DIAGNOSIS — Z5181 Encounter for therapeutic drug level monitoring: Secondary | ICD-10-CM | POA: Diagnosis not present

## 2017-11-14 NOTE — Progress Notes (Signed)
[place] so she will have labs done at [place].

## 2017-11-14 NOTE — Telephone Encounter (Signed)
Left voicemail for the patient to call the office for anti-coag.

## 2017-11-14 NOTE — Patient Instructions (Signed)
Description   Spoke with pt. Instructed to continue same of coumadin  10mg  daily except 5mg  on Mondays, Wednesdays and Saturdays. Continue eating 2 servings of leafy green vegetables each week.  Recheck INR in 4 weeks pt states [place] she will go to Dr. Wendy Poet office or have drawn at the lab.

## 2017-11-17 ENCOUNTER — Other Ambulatory Visit: Payer: Self-pay

## 2017-11-17 ENCOUNTER — Telehealth: Payer: Self-pay | Admitting: *Deleted

## 2017-11-17 MED ORDER — WARFARIN SODIUM 10 MG PO TABS: ORAL_TABLET | ORAL | 0 refills | Status: DC

## 2017-11-17 NOTE — Telephone Encounter (Signed)
Need enough coumadin to hold pt over until mail order gets there. #14 would be good, [facility] [place].

## 2017-11-17 NOTE — Telephone Encounter (Signed)
Med refill has been sent in. 

## 2017-12-27 ENCOUNTER — Ambulatory Visit (INDEPENDENT_AMBULATORY_CARE_PROVIDER_SITE_OTHER): Payer: Medicare Other

## 2017-12-27 DIAGNOSIS — I480 Paroxysmal atrial fibrillation: Secondary | ICD-10-CM | POA: Diagnosis not present

## 2017-12-27 DIAGNOSIS — Z7901 Long term (current) use of anticoagulants: Secondary | ICD-10-CM | POA: Diagnosis not present

## 2017-12-27 DIAGNOSIS — Z5181 Encounter for therapeutic drug level monitoring: Secondary | ICD-10-CM

## 2017-12-27 LAB — POCT INR: INR: 2.2 (ref 2.0–3.0)

## 2017-12-27 NOTE — Patient Instructions (Signed)
Description   Informed patient to continue 10mg  daily except 5mg  on Mondays, Wednesdays and Saturdays. Continue eating 2 servings of leafy green vegetables each week. Recheck INR in 4 weeks.

## 2018-01-03 ENCOUNTER — Telehealth: Payer: Self-pay | Admitting: Cardiology

## 2018-01-03 ENCOUNTER — Other Ambulatory Visit: Payer: Self-pay | Admitting: Emergency Medicine

## 2018-01-03 MED ORDER — WARFARIN SODIUM 10 MG PO TABS: ORAL_TABLET | ORAL | 3 refills | Status: DC

## 2018-01-03 NOTE — Telephone Encounter (Signed)
Refilled warfarin 10 mg tablet

## 2018-01-03 NOTE — Telephone Encounter (Signed)
° ° ° °  1. Which medications need to be refilled? (please list name of each medication and dose if known) Warfarin 10mg   2. Which pharmacy/location (including street and city if local pharmacy) is [facility]  3. Do they need a 30 day or 90 day supply? 90 day supply

## 2018-01-03 NOTE — Telephone Encounter (Signed)
Medication refilled

## 2018-01-13 ENCOUNTER — Telehealth: Payer: Self-pay | Admitting: Cardiology

## 2018-01-13 ENCOUNTER — Other Ambulatory Visit: Payer: Self-pay | Admitting: Emergency Medicine

## 2018-01-13 MED ORDER — WARFARIN SODIUM 10 MG PO TABS: ORAL_TABLET | ORAL | 3 refills | Status: DC

## 2018-01-13 NOTE — Telephone Encounter (Signed)
°*  STAT* If patient is at the pharmacy, call can be transferred to refill team.   1. Which medications need to be refilled? (please list name of each medication and dose if known) WARFARIN 10mg   2. Which pharmacy/location (including street and city if local pharmacy) is medication to be sent to? [facility]  3. Do they need a 30 day or 90 day supply? 90   Please make sure you do not send any meds to [facility] she doe snot [place] anymore.

## 2018-01-13 NOTE — Telephone Encounter (Signed)
Coumadin 10 mg refilled.

## 2018-01-17 ENCOUNTER — Telehealth: Payer: Self-pay | Admitting: Cardiology

## 2018-01-17 ENCOUNTER — Telehealth: Payer: Self-pay | Admitting: *Deleted

## 2018-01-17 MED ORDER — WARFARIN SODIUM 10 MG PO TABS: ORAL_TABLET | ORAL | 0 refills | Status: DC

## 2018-01-17 NOTE — Telephone Encounter (Signed)
Pt stated has always gotten 90 day supply of her coumadin and would like this called in to [facility] please.

## 2018-01-17 NOTE — Telephone Encounter (Signed)
Rx refilled as requested.  

## 2018-01-17 NOTE — Telephone Encounter (Signed)
[facility].

## 2018-01-17 NOTE — Telephone Encounter (Signed)
[facility] needs [facility] phone call regarding her Warfarin. [phone] and reference number is [id number]

## 2018-01-24 ENCOUNTER — Ambulatory Visit (INDEPENDENT_AMBULATORY_CARE_PROVIDER_SITE_OTHER): Payer: Medicare Other | Admitting: Pharmacist

## 2018-01-24 DIAGNOSIS — Z5181 Encounter for therapeutic drug level monitoring: Secondary | ICD-10-CM | POA: Diagnosis not present

## 2018-01-24 DIAGNOSIS — I480 Paroxysmal atrial fibrillation: Secondary | ICD-10-CM

## 2018-01-24 DIAGNOSIS — Z7901 Long term (current) use of anticoagulants: Secondary | ICD-10-CM | POA: Diagnosis not present

## 2018-01-24 LAB — POCT INR: INR: 1.5 — AB (ref 2.0–3.0)

## 2018-01-24 NOTE — Patient Instructions (Signed)
Description   Take 1.5 tablets today then continue 10mg  daily except 5mg  on Mondays, Wednesdays and Saturdays. Continue eating 2 servings of leafy green vegetables each week. Recheck INR in 3 weeks.

## 2018-02-13 ENCOUNTER — Ambulatory Visit (INDEPENDENT_AMBULATORY_CARE_PROVIDER_SITE_OTHER): Payer: Medicare Other

## 2018-02-13 ENCOUNTER — Other Ambulatory Visit: Payer: Self-pay

## 2018-02-13 DIAGNOSIS — E785 Hyperlipidemia, unspecified: Secondary | ICD-10-CM | POA: Diagnosis not present

## 2018-02-13 DIAGNOSIS — Z7901 Long term (current) use of anticoagulants: Secondary | ICD-10-CM

## 2018-02-13 DIAGNOSIS — Z789 Other specified health status: Secondary | ICD-10-CM

## 2018-02-13 DIAGNOSIS — I480 Paroxysmal atrial fibrillation: Secondary | ICD-10-CM

## 2018-02-13 DIAGNOSIS — I10 Essential (primary) hypertension: Secondary | ICD-10-CM

## 2018-02-13 DIAGNOSIS — F17200 Nicotine dependence, unspecified, uncomplicated: Secondary | ICD-10-CM

## 2018-02-13 DIAGNOSIS — E119 Type 2 diabetes mellitus without complications: Secondary | ICD-10-CM

## 2018-02-13 DIAGNOSIS — Z5181 Encounter for therapeutic drug level monitoring: Secondary | ICD-10-CM

## 2018-02-13 LAB — POCT INR: INR: 2.6 (ref 2.0–3.0)

## 2018-02-13 NOTE — Patient Instructions (Signed)
Description   Continue 10mg  daily except 5mg  on Mondays, Wednesdays and Saturdays. Continue eating 2 servings of leafy green vegetables each week. Recheck INR in 3 weeks.

## 2018-02-13 NOTE — Progress Notes (Signed)
Carotid duplex exam has been performed. 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 No ICA stenosis was identified.  Jimmy Roye Gustafson RDCS, RVT

## 2018-02-13 NOTE — Progress Notes (Signed)
Complete echocardiogram has been performed.  Jimmy Ludwig Tugwell RDCS, RVT 

## 2018-03-07 ENCOUNTER — Ambulatory Visit (INDEPENDENT_AMBULATORY_CARE_PROVIDER_SITE_OTHER): Payer: Medicare Other | Admitting: Pharmacist

## 2018-03-07 DIAGNOSIS — Z5181 Encounter for therapeutic drug level monitoring: Secondary | ICD-10-CM

## 2018-03-07 DIAGNOSIS — Z7901 Long term (current) use of anticoagulants: Secondary | ICD-10-CM

## 2018-03-07 DIAGNOSIS — I480 Paroxysmal atrial fibrillation: Secondary | ICD-10-CM

## 2018-03-07 LAB — POCT INR: INR: 2.2 (ref 2.0–3.0)

## 2018-03-07 NOTE — Patient Instructions (Signed)
Description   Continue 10mg  daily except 5mg  on Mondays, Wednesdays and Saturdays. Continue eating 2 servings of leafy green vegetables each week. Recheck INR in 4 weeks.

## 2018-04-18 ENCOUNTER — Ambulatory Visit (INDEPENDENT_AMBULATORY_CARE_PROVIDER_SITE_OTHER): Payer: Medicare Other | Admitting: Pharmacist

## 2018-04-18 DIAGNOSIS — Z5181 Encounter for therapeutic drug level monitoring: Secondary | ICD-10-CM | POA: Diagnosis not present

## 2018-04-18 DIAGNOSIS — I480 Paroxysmal atrial fibrillation: Secondary | ICD-10-CM | POA: Diagnosis not present

## 2018-04-18 DIAGNOSIS — Z7901 Long term (current) use of anticoagulants: Secondary | ICD-10-CM | POA: Diagnosis not present

## 2018-04-18 LAB — POCT INR: INR: 2.2 (ref 2.0–3.0)

## 2018-04-18 NOTE — Patient Instructions (Signed)
Description   Continue 10mg  daily except 5mg  on Mondays, Wednesdays and Saturdays. Continue eating 2 servings of leafy green vegetables each week. Recheck INR in 4 weeks.

## 2018-05-30 ENCOUNTER — Ambulatory Visit (INDEPENDENT_AMBULATORY_CARE_PROVIDER_SITE_OTHER): Payer: Medicare Other | Admitting: *Deleted

## 2018-05-30 DIAGNOSIS — I480 Paroxysmal atrial fibrillation: Secondary | ICD-10-CM | POA: Diagnosis not present

## 2018-05-30 DIAGNOSIS — Z5181 Encounter for therapeutic drug level monitoring: Secondary | ICD-10-CM | POA: Diagnosis not present

## 2018-05-30 DIAGNOSIS — Z7901 Long term (current) use of anticoagulants: Secondary | ICD-10-CM | POA: Diagnosis not present

## 2018-05-30 LAB — POCT INR: INR: 2.3 (ref 2.0–3.0)

## 2018-05-30 NOTE — Patient Instructions (Signed)
Description   Continue 10mg  daily except 5mg  on Mondays, Wednesdays and Saturdays. Continue eating 2 servings of leafy green vegetables each week. Recheck INR in 5 weeks.

## 2018-06-12 DIAGNOSIS — E119 Type 2 diabetes mellitus without complications: Secondary | ICD-10-CM | POA: Insufficient documentation

## 2018-06-12 HISTORY — DX: Type 2 diabetes mellitus without complications: E11.9

## 2018-06-13 ENCOUNTER — Other Ambulatory Visit: Payer: Self-pay | Admitting: Obstetrics & Gynecology

## 2018-06-13 DIAGNOSIS — R928 Other abnormal and inconclusive findings on diagnostic imaging of breast: Secondary | ICD-10-CM

## 2018-06-14 ENCOUNTER — Other Ambulatory Visit: Payer: Self-pay | Admitting: Obstetrics & Gynecology

## 2018-06-14 ENCOUNTER — Ambulatory Visit
Admission: RE | Admit: 2018-06-14 | Discharge: 2018-06-14 | Disposition: A | Discharge status: Home / Self Care | Payer: Medicare Other | Source: Ambulatory Visit | Attending: Obstetrics & Gynecology | Admitting: Obstetrics & Gynecology

## 2018-06-14 DIAGNOSIS — R928 Other abnormal and inconclusive findings on diagnostic imaging of breast: Secondary | ICD-10-CM

## 2018-06-14 DIAGNOSIS — N6310 Unspecified lump in the right breast, unspecified quadrant: Secondary | ICD-10-CM

## 2018-06-19 ENCOUNTER — Ambulatory Visit
Admission: RE | Admit: 2018-06-19 | Discharge: 2018-06-19 | Disposition: A | Discharge status: Home / Self Care | Payer: Medicare Other | Source: Ambulatory Visit | Attending: Obstetrics & Gynecology | Admitting: Obstetrics & Gynecology

## 2018-06-19 DIAGNOSIS — N6310 Unspecified lump in the right breast, unspecified quadrant: Secondary | ICD-10-CM

## 2018-06-20 ENCOUNTER — Telehealth: Payer: Self-pay | Admitting: Hematology and Oncology

## 2018-06-20 NOTE — Telephone Encounter (Signed)
Spoke with patient to confirm afternoon [facility] appointment for 3/18, packet mailed to patient

## 2018-06-26 ENCOUNTER — Encounter: Payer: Self-pay | Admitting: *Deleted

## 2018-06-26 DIAGNOSIS — Z170 Estrogen receptor positive status [ER+]: Principal | ICD-10-CM

## 2018-06-26 DIAGNOSIS — C50411 Malignant neoplasm of upper-outer quadrant of right female breast: Secondary | ICD-10-CM | POA: Insufficient documentation

## 2018-06-26 HISTORY — DX: Estrogen receptor positive status (ER+): C50.411

## 2018-06-26 HISTORY — DX: Estrogen receptor positive status (ER+): Z17.0

## 2018-06-27 NOTE — Progress Notes (Signed)
Radiation Oncology         [id number]) [phone] ________________________________  Initial outpatient Consultation  Name: Sabrina [name] MRN: [id number]  Date: 06/28/2018  DOB: 31-Jan-1951  XB:[id number], Marshall Cork., MD  Erroll Luna, MD   REFERRING PHYSICIAN: Erroll Luna, MD  DIAGNOSIS:    ICD-10-CM   1. 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 female who presented with breast abnormality on the following imaging: screening mammogram on the date of 06/13/2018.  Symptoms, if any, at that time, were: none.   Ultrasound of breast on 06/14/2018 revealed suspicious right breast 49m mass at 9:30, no evidence of right axillary lymphadenopathy.   Biopsy on date of 06/19/2018 showed invasive ductal carcinoma.  ER status: 100%; PR status 10%, Her2 status positive; Grade 1.  On review of systems she reports muscle aches, glasses, dentures, irregular heartbeat, lump in breast, bruising easily, diabetes, thyroid problem  PREVIOUS RADIATION THERAPY: No  PAST MEDICAL HISTORY:  has a past medical history of Atrial fibrillation ([place] [I48.91] (11/08/2016), Benign essential hypertension (08/25/2015), Diabetes ([place], Hyperlipidemia, and Hypothyroidism due to Hashimoto's thyroiditis (05/23/2015).    PAST SURGICAL HISTORY: Past Surgical History:  Procedure Laterality Date   ABDOMINAL HYSTERECTOMY     APPENDECTOMY     BREAST SURGERY      CHOLECYSTECTOMY     EYE SURGERY     KNEE SURGERY      FAMILY HISTORY: family history includes Acute myelogenous leukemia in her brother; Alcohol abuse in her maternal aunt, maternal uncle, and paternal uncle; Breast cancer in her cousin, cousin, and maternal aunt; Breast cancer (age of onset: 441 in her mother; Cancer in her brother and maternal uncle; Diabetes in her maternal uncle, paternal uncle, and son; Hypertension in her maternal aunt and maternal grandmother; Prostate cancer in her father.  SOCIAL HISTORY:  reports that she has been smoking. She has been smoking about 2.00 packs per day. She has never used smokeless tobacco. She reports that she does not drink alcohol or use drugs.  ALLERGIES: Atorvastatin and Meperidine  MEDICATIONS:  Current Outpatient Medications  Medication Sig Dispense Refill   digoxin (DIGOX) 0.125 MG tablet Take 1 tablet (125 mcg total) by mouth daily. 90 tablet 3   escitalopram (LEXAPRO) 10 MG tablet Take 1 tablet by mouth daily.     flecainide (TAMBOCOR) 100 MG tablet Take 1 tablet (100 mg total) by mouth 2 (two) times daily. 180 tablet 3   levothyroxine (SYNTHROID, LEVOTHROID) 100 MCG tablet Take 1 tablet by mouth daily.     lisinopril (PRINIVIL,ZESTRIL) 10 MG tablet Take 1 tablet (10 mg total) by mouth daily. 90 tablet 3   metFORMIN (GLUCOPHAGE) 500 MG tablet Take 1 tablet by mouth daily.     metoprolol succinate (TOPROL-XL) 50 MG 24 hr tablet Take 1 tablet (50 mg total) by mouth daily. 90 tablet 3   warfarin (COUMADIN) 10 MG tablet Take as directed by [facility] 85 tablet 0  No current facility-administered medications for this encounter.     REVIEW OF SYSTEMS: A 10+ POINT REVIEW OF SYSTEMS WAS OBTAINED including neurology, dermatology, psychiatry, cardiac, respiratory, lymph, extremities, GI, GU, Musculoskeletal, constitutional, breasts, reproductive, HEENT.  All pertinent positives are noted in the HPI.  All others are negative.     PHYSICAL EXAM:  Vitals - 1 value per visit 10/18/6281  SYSTOLIC 662  DIASTOLIC 69  Pulse 59  Temperature 98.7  Respirations 17  Weight (lb) 183.5  Height 5' 9.5"  BMI 26.71  VISIT REPORT   General: Alert and oriented, in no acute distress HEENT: Head is normocephalic. Extraocular movements are intact.  Dentures present but not removed  Neck: Neck is supple, no palpable cervical or supraclavicular lymphadenopathy. Heart: Regular in rate and rhythm with no murmurs, rubs, or gallops. Chest: Clear to auscultation bilaterally, with no rhonchi, wheezes, or rales. Abdomen: Soft, nontender, nondistended, with no rigidity or guarding. Extremities: No cyanosis or edema. Lymphatics: see Neck Exam Skin: No concerning lesions. Musculoskeletal: symmetric strength and muscle tone throughout. Neurologic: Cranial nerves II through XII are grossly intact. No obvious focalities. Speech is fluent. Coordination is intact. Psychiatric: Judgment and insight are intact. Affect is appropriate. Breasts: She has bruising in the site of her biopsy but no obvious mass in the right breast. No other palpable masses appreciated in the breasts or axillae   ECOG = 1  0 - Asymptomatic (Fully active, able to carry on all predisease activities without restriction)  1 - Symptomatic but completely ambulatory (Restricted in physically strenuous activity but ambulatory and able to carry out work of a light or sedentary nature. For example, light housework, office work)  2 - Symptomatic, <50% in bed during the day (Ambulatory and capable of all self care but unable to carry out any work activities. Up and about more than 50% of waking hours)  3 - Symptomatic, >50% in bed, but not bedbound (Capable of only limited self-care, confined to bed or chair 50% or more of waking hours)  4 - Bedbound (Completely disabled. Cannot carry on any self-care. Totally confined to bed or chair)  5 - Death   Eustace Pen MM, Creech RH, Tormey  DC, et al. [phone]). "Toxicity and response criteria of the [facility]". [place] Oncol. 5 (6): 649-55   LABORATORY DATA:  Lab Results  Component Value Date   WBC 6.7 06/28/2018   HGB 12.6 06/28/2018   HCT 40.7 06/28/2018   MCV 93.8 06/28/2018   PLT 159 06/28/2018   CMP     Component Value Date/Time   NA 142 06/28/2018 1227   K 4.3 06/28/2018 1227   CL 105 06/28/2018 1227   CO2 29 06/28/2018 1227   GLUCOSE 105 (H) 06/28/2018 1227   BUN 11 06/28/2018 1227   CREATININE 0.65 06/28/2018 1227   CALCIUM 8.7 (L) 06/28/2018 1227   PROT 6.6 06/28/2018 1227   ALBUMIN 3.7 06/28/2018 1227   AST 13 (L) 06/28/2018 1227   ALT 11 06/28/2018 1227   ALKPHOS 76 06/28/2018 1227   BILITOT 0.4 06/28/2018 1227   GFRNONAA >60 06/28/2018 1227   GFRAA >60 06/28/2018 1227         RADIOGRAPHY: US Breast Ltd Uni Right Inc Axilla  Result Date: 06/14/2018 CLINICAL DATA:  Screening recall for a possible right breast mass. EXAM: DIGITAL DIAGNOSTIC RIGHT MAMMOGRAM WITH CAD AND TOMO ULTRASOUND RIGHT BREAST COMPARISON:  Previous exam(s). ACR Breast Density Category b: There are scattered areas of  fibroglandular density. FINDINGS: Spot compression tomosynthesis images of the upper outer quadrant of the right breast demonstrates a persistent irregular mass with indistinct margins. The mass measures approximately 7 mm. Mammographic images were processed with CAD. Ultrasound of right breast at 9 o'clock, 3 cm nipple demonstrates a hypoechoic mass, which is taller measuring 7 x 5 x 5 mm. Ultrasound of the right axilla demonstrates multiple normal-appearing lymph nodes. IMPRESSION: 1.  There is a suspicious right breast mass at 9:30. 2.  No evidence of right axillary lymphadenopathy. RECOMMENDATION: Ultrasound guided biopsy is recommended for the right breast mass at 9:30. The procedure has been scheduled for 06/19/2018 at 3:45 p.m. I have discussed the findings and recommendations with the  patient. Results were also provided in writing at the conclusion of the visit. If applicable, a reminder letter will be sent to the patient regarding the next appointment. BI-RADS CATEGORY  4: Suspicious. Electronically Signed   By: Ammie Ferrier M.D.   On: 06/14/2018 15:30   Mm Diag Breast Tomo Uni Right  Result Date: 06/14/2018 CLINICAL DATA:  Screening recall for a possible right breast mass. EXAM: DIGITAL DIAGNOSTIC RIGHT MAMMOGRAM WITH CAD AND TOMO ULTRASOUND RIGHT BREAST COMPARISON:  Previous exam(s). ACR Breast Density Category b: There are scattered areas of fibroglandular density. FINDINGS: Spot compression tomosynthesis images of the upper outer quadrant of the right breast demonstrates a persistent irregular mass with indistinct margins. The mass measures approximately 7 mm. Mammographic images were processed with CAD. Ultrasound of right breast at 9 o'clock, 3 cm nipple demonstrates a hypoechoic mass, which is taller measuring 7 x 5 x 5 mm. Ultrasound of the right axilla demonstrates multiple normal-appearing lymph nodes. IMPRESSION: 1.  There is a suspicious right breast mass at 9:30. 2.  No evidence of right axillary lymphadenopathy. RECOMMENDATION: Ultrasound guided biopsy is recommended for the right breast mass at 9:30. The procedure has been scheduled for 06/19/2018 at 3:45 p.m. I have discussed the findings and recommendations with the patient. Results were also provided in writing at the conclusion of the visit. If applicable, a reminder letter will be sent to the patient regarding the next appointment. BI-RADS CATEGORY  4: Suspicious. Electronically Signed   By: Ammie Ferrier M.D.   On: 06/14/2018 15:30   Mm Clip Placement Right  Result Date: 06/19/2018 CLINICAL DATA:  Evaluate RIBBON clip position following ultrasound-guided RIGHT breast biopsy. EXAM: DIAGNOSTIC RIGHT MAMMOGRAM POST ULTRASOUND BIOPSY COMPARISON:  Previous exam(s). FINDINGS: Mammographic images were obtained  following ultrasound guided biopsy of 0.7 cm mass at the 9:30 position of the RIGHT breast. The RIBBON clip is in satisfactory position. IMPRESSION: Satisfactory RIBBON clip position following ultrasound-guided RIGHT breast biopsy. Final Assessment: Post Procedure Mammograms for Marker Placement Electronically Signed   By: Margarette Canada M.D.   On: 06/19/2018 16:18   [place] Rt Breast Bx W Loc Dev 1st Lesion Img Bx Spec US Guide  Addendum Date: 06/20/2018   ADDENDUM REPORT: 06/20/2018 14:39 ADDENDUM: Pathology revealed GRADE I INVASIVE DUCTAL CARCINOMA of the Right breast, upper outer, 9:30 o'clock. This was found to be concordant by Dr. Hassan Rowan. Pathology results were discussed with the patient by telephone. The patient reported doing well after the biopsy with tenderness at the site. Post biopsy instructions and care were reviewed and questions were answered. The patient was encouraged to call [facility] for any additional concerns. The patient was referred to [facility]  on June 28, 2018. Pathology results reported by Terie Purser, RN on 06/20/2018. Electronically Signed   By: Margarette Canada M.D.   On: 06/20/2018 14:39   Result Date: 06/20/2018 CLINICAL DATA:  68 year old female for tissue sampling of 0.7 cm UPPER-OUTER RIGHT breast mass. EXAM: ULTRASOUND GUIDED RIGHT BREAST CORE NEEDLE BIOPSY COMPARISON:  Previous exam(s). FINDINGS: I met with the patient and we discussed the procedure of ultrasound-guided biopsy, including benefits and alternatives. We discussed the high likelihood of a successful procedure. We discussed the risks of the procedure, including infection, bleeding, tissue injury, clip migration, and inadequate sampling. Informed written consent was given. The usual time-out protocol was performed immediately prior to the procedure. Lesion quadrant: UPPER-OUTER RIGHT breast Using sterile technique  and 1% Lidocaine as local anesthetic, under direct ultrasound visualization, a 14 gauge spring-loaded device was used to perform biopsy of the 0.7 cm mass at the 9:30 position of the RIGHT breast 3 cm from the nipple using a LATERAL approach. At the conclusion of the procedure a RIBBON tissue marker clip was deployed into the biopsy cavity. Follow up 2 view mammogram was performed and dictated separately. IMPRESSION: Ultrasound guided biopsy of 0.7 cm UPPER-OUTER RIGHT breast mass. No apparent complications. Electronically Signed: By: Margarette Canada M.D. 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  The consensus is that she would be a good candidate for breast conservation. I talked to her about the option of a mastectomy and informed her that her expected overall survival would be equivalent between mastectomy and breast conservation, based upon randomized controlled data. She is enthusiastic about breast conservation.  It was a pleasure meeting the patient today. We discussed the risks, benefits, and side effects of radiotherapy. I recommend radiotherapy to the right breast to reduce her risk of locoregional recurrence by 2/3.  We discussed that radiation would take approximately 4 weeks to complete and that I would give the patient a few weeks to heal following surgery before starting treatment planning.  If chemotherapy were to be given, this would precede radiotherapy. We spoke about acute effects including skin irritation and fatigue as well as much less common late effects including internal organ injury or irritation. We spoke about the latest technology that is used to minimize the risk of late effects for patients undergoing radiotherapy to the breast or chest wall. No guarantees of treatment were given. The patient is enthusiastic about proceeding with treatment. I look forward to  participating in the patient's care.  I will await her referral back to me for postoperative follow-up and eventual CT simulation/treatment planning  Of note the patient smokes 2 packs/day.  I explained that smoking could complicate her ability to heal from treatments and tolerate treatments.  I asked about her motivation to quit smoking.  Right now she is not demonstrating any motivation to quit   __________________________________________   Eppie Gibson, MD   This document serves as a record of services personally performed by Eppie Gibson, MD. It was created on her behalf by Wilburn Mylar, a trained medical scribe. The creation of this record is based on the scribe's personal observations and the provider's statements to them. This document has been checked and approved by the attending provider.

## 2018-06-27 NOTE — Progress Notes (Signed)
[facility] CONSULT NOTE  Patient Care Team: Enid Skeens., MD as PCP - General (Family Medicine) Mauro Kaufmann, RN as Oncology Nurse Navigator Rockwell Germany, RN as Oncology Nurse Navigator Erroll Luna, MD as Consulting Physician (General Surgery) Nicholas Lose, MD as Consulting Physician (Hematology and Oncology) Eppie Gibson, MD as Attending Physician (Radiation Oncology)  CHIEF COMPLAINTS/PURPOSE OF CONSULTATION: Newly diagnosed breast cancer  HISTORY OF PRESENTING ILLNESS:  Sabrina [name] 68 y.o. female is here because of recent diagnosis of invasive ductal carcinoma of the right breast. The cancer was detected on a screening mammogram on 06/13/18. A diagnostic mammogram from 06/14/18 showed a 46m mass in the right breast and no right axillary lymphadenopathy. A biopsy from 06/19/18 showed the cancer to be grade 1 IDC, HER2 positive, ER 100%, PR 10%, Ki67 10%.   She presents to the clinic today accompanied by her daughter to discuss her treatment plan.  She is a long-term smoker 2 packs/day.  Her husband passed away 9 months ago and that she has only increased her smoking.  She has no intention to quit.  She reports a family history of breast cancer in her mother at age 68   I reviewed her records extensively and collaborated the history with the patient.  SUMMARY OF ONCOLOGIC HISTORY:   Malignant neoplasm of upper-outer quadrant of right breast in female, estrogen receptor positive ([place]   06/19/2018 Initial Diagnosis    Screening mammogram detected right breast mass at 9 o'clock position 3 cm from the nipple, ultrasound revealed 7 x 5 x 5 mm mass, axillary ultrasound normal-appearing lymph nodes, ultrasound biopsy: Grade 1 IDC ER 100%, PR 10%, Ki-67 10%, HER-2 3+ positive, T1b N0 stage Ia    06/28/2018 Cancer Staging    Staging form: Breast, AJCC 8th Edition - Clinical stage from 06/28/2018: Stage IA (cT1b, cN0, cM0, G1, ER+, PR+, HER2+) - Signed by GNicholas Lose MD on 06/28/2018     MEDICAL HISTORY:  Past Medical History:  Diagnosis Date  . Atrial fibrillation ([place] [I48.91] 11/08/2016  . Benign essential hypertension 08/25/2015  . Diabetes ([place]   . Hyperlipidemia   . Hypothyroidism due to Hashimoto's thyroiditis 05/23/2015    SURGICAL HISTORY: Past Surgical History:  Procedure Laterality Date  . ABDOMINAL HYSTERECTOMY    . APPENDECTOMY    . BREAST SURGERY    . CHOLECYSTECTOMY    . EYE SURGERY    . KNEE SURGERY      SOCIAL HISTORY: Social History   Socioeconomic History  . Marital status: Widowed    Spouse name: Not on file  . Number of children: Not on file  . Years of education: Not on file  . Highest education level: Not on file  Occupational History  . Not on file  Social Needs  . Financial resource strain: Not on file  . Food insecurity:    Worry: Not on file    Inability: Not on file  . Transportation needs:    Medical: Not on file    Non-medical: Not on file  Tobacco Use  . Smoking status: Current Every Day Smoker    Packs/day: 2.00  . Smokeless tobacco: Never Used  Substance and Sexual Activity  . Alcohol use: Never    Frequency: Never  . Drug use: Never  . Sexual activity: Not on file  Lifestyle  . Physical activity:    Days per week: Not on file    Minutes per session: Not on file  .  Stress: Not on file  Relationships  . Social connections:    Talks on phone: Not on file    Gets together: Not on file    Attends religious service: Not on file    Active member of club or organization: Not on file    Attends meetings of clubs or organizations: Not on file    Relationship status: Not on file  . Intimate partner violence:    Fear of current or ex partner: Not on file    Emotionally abused: Not on file    Physically abused: Not on file    Forced sexual activity: Not on file  Other Topics Concern  . Not on file  Social History Narrative  . Not on file    FAMILY HISTORY: Family History   Problem Relation Age of Onset  . Prostate cancer Father   . Cancer Brother   . Acute myelogenous leukemia Brother   . Alcohol abuse Maternal Aunt   . Hypertension Maternal Aunt   . Breast cancer Maternal Aunt   . Alcohol abuse Paternal Uncle   . Diabetes Paternal Uncle   . Hypertension Maternal Grandmother   . Breast cancer Mother 23       Deceased at 68  . Cancer Maternal Uncle   . Diabetes Maternal Uncle   . Alcohol abuse Maternal Uncle   . Diabetes Son   . Breast cancer Cousin   . Breast cancer Cousin     ALLERGIES:  is allergic to atorvastatin and meperidine.  MEDICATIONS:  Current Outpatient Medications  Medication Sig Dispense Refill  . digoxin (DIGOX) 0.125 MG tablet Take 1 tablet (125 mcg total) by mouth daily. 90 tablet 3  . escitalopram (LEXAPRO) 10 MG tablet Take 1 tablet by mouth daily.    . flecainide (TAMBOCOR) 100 MG tablet Take 1 tablet (100 mg total) by mouth 2 (two) times daily. 180 tablet 3  . levothyroxine (SYNTHROID, LEVOTHROID) 100 MCG tablet Take 1 tablet by mouth daily.    Marland Kitchen lisinopril (PRINIVIL,ZESTRIL) 10 MG tablet Take 1 tablet (10 mg total) by mouth daily. 90 tablet 3  . metFORMIN (GLUCOPHAGE) 500 MG tablet Take 1 tablet by mouth daily.    . metoprolol succinate (TOPROL-XL) 50 MG 24 hr tablet Take 1 tablet (50 mg total) by mouth daily. 90 tablet 3  . warfarin (COUMADIN) 10 MG tablet Take as directed by [facility] 85 tablet 0   No current facility-administered medications for this visit.     REVIEW OF SYSTEMS:   Constitutional: Denies fevers, chills or abnormal night sweats Eyes: Denies blurriness of vision, double vision or watery eyes Ears, nose, mouth, throat, and face: Denies mucositis or sore throat Respiratory: Denies cough, dyspnea or wheezes Cardiovascular: Denies palpitation, chest discomfort or lower extremity swelling Gastrointestinal:  Denies nausea, heartburn or change in bowel habits Skin: Denies abnormal skin rashes  Lymphatics: Denies new lymphadenopathy or easy bruising Neurological:Denies numbness, tingling or new weaknesses Behavioral/Psych: Mood is stable, no new changes  Breast: Denies any palpable lumps or discharge All other systems were reviewed with the patient and are negative.  PHYSICAL EXAMINATION: ECOG PERFORMANCE STATUS: 1 - Symptomatic but completely ambulatory  Vitals:   06/28/18 1257  BP: (!) 150/69  Pulse: (!) 59  Resp: 17  Temp: 98.7 F (37.1 C)  SpO2: 95%   Filed Weights   06/28/18 1257  Weight: 183 lb 8 oz (83.2 kg)    GENERAL:alert, no distress and comfortable SKIN: skin color, texture, turgor  are normal, no rashes or significant lesions EYES: normal, conjunctiva are pink and non-injected, sclera clear OROPHARYNX:no exudate, no erythema and lips, buccal mucosa, and tongue normal  NECK: supple, thyroid normal size, non-tender, without nodularity LYMPH:  no palpable lymphadenopathy in the cervical, axillary or inguinal LUNGS: clear to auscultation and percussion with normal breathing effort HEART: regular rate & rhythm and no murmurs and no lower extremity edema ABDOMEN:abdomen soft, non-tender and normal bowel sounds Musculoskeletal:no cyanosis of digits and no clubbing  PSYCH: alert & oriented x 3 with fluent speech NEURO: no focal motor/sensory deficits BREAST: No palpable nodules in breast. No palpable axillary or supraclavicular lymphadenopathy (exam performed in the presence of a chaperone)   LABORATORY DATA:  I have reviewed the data as listed Lab Results  Component Value Date   WBC 6.7 06/28/2018   HGB 12.6 06/28/2018   HCT 40.7 06/28/2018   MCV 93.8 06/28/2018   PLT 159 06/28/2018   Lab Results  Component Value Date   NA 142 06/28/2018   K 4.3 06/28/2018   CL 105 06/28/2018   CO2 29 06/28/2018    RADIOGRAPHIC STUDIES: I have personally reviewed the radiological reports and agreed with the findings in the report.  ASSESSMENT AND PLAN:   Malignant neoplasm of upper-outer quadrant of right breast in female, estrogen receptor positive ([place]) 06/19/2018:Screening mammogram detected right breast mass at 9 o'clock position 3 cm from the nipple, ultrasound revealed 7 x 5 x 5 mm mass, axillary ultrasound normal-appearing lymph nodes, ultrasound biopsy: Grade 1 IDC ER 100%, PR 10%, Ki-67 10%, HER-2 3+ positive, T1b N0 stage Ia  Pathology and radiology counseling: Discussed with the patient, the details of pathology including the type of breast cancer,the clinical staging, the significance of ER, PR and HER-2/neu receptors and the implications for treatment. After reviewing the pathology in detail, we proceeded to discuss the different treatment options between surgery, radiation, chemotherapy, antiestrogen therapies.  Treatment plan: 1.  Breast conserving surgery with sentinel lymph node biopsy 2.  Based on tumor size patient may need adjuvant chemotherapy with Taxol and Herceptin followed by Herceptin maintenance for 1 year if the final tumor size is greater than 5 mm 3.  Adjuvant radiation therapy 4.  Followed by adjuvant antiestrogen therapy  Return to clinic after surgery to discuss final pathology report.    All questions were answered. The patient knows to call the clinic with any problems, questions or concerns.   Nicholas Lose, MD 06/28/2018   I, Cloyde Reams Dorshimer, am acting as scribe for Nicholas Lose, MD.  I have reviewed the above documentation for accuracy and completeness, and I agree with the above.

## 2018-06-28 ENCOUNTER — Inpatient Hospital Stay: Payer: Medicare Other | Attending: Hematology and Oncology | Admitting: Hematology and Oncology

## 2018-06-28 ENCOUNTER — Inpatient Hospital Stay: Payer: Medicare Other

## 2018-06-28 ENCOUNTER — Ambulatory Visit: Payer: Self-pay | Admitting: Surgery

## 2018-06-28 ENCOUNTER — Ambulatory Visit
Admission: RE | Admit: 2018-06-28 | Discharge: 2018-06-28 | Disposition: A | Discharge status: Home / Self Care | Payer: Medicare Other | Source: Ambulatory Visit | Attending: Radiation Oncology | Admitting: Radiation Oncology

## 2018-06-28 ENCOUNTER — Other Ambulatory Visit: Payer: Self-pay

## 2018-06-28 ENCOUNTER — Encounter: Payer: Self-pay | Admitting: Radiation Oncology

## 2018-06-28 ENCOUNTER — Ambulatory Visit: Payer: Medicare Other | Attending: Surgery | Admitting: Physical Therapy

## 2018-06-28 ENCOUNTER — Encounter: Payer: Self-pay | Admitting: Physical Therapy

## 2018-06-28 ENCOUNTER — Encounter: Payer: Self-pay | Admitting: Hematology and Oncology

## 2018-06-28 DIAGNOSIS — Z8042 Family history of malignant neoplasm of prostate: Secondary | ICD-10-CM | POA: Insufficient documentation

## 2018-06-28 DIAGNOSIS — Z809 Family history of malignant neoplasm, unspecified: Secondary | ICD-10-CM | POA: Diagnosis not present

## 2018-06-28 DIAGNOSIS — C50411 Malignant neoplasm of upper-outer quadrant of right female breast: Secondary | ICD-10-CM

## 2018-06-28 DIAGNOSIS — Z806 Family history of leukemia: Secondary | ICD-10-CM | POA: Diagnosis not present

## 2018-06-28 DIAGNOSIS — Z170 Estrogen receptor positive status [ER+]: Principal | ICD-10-CM

## 2018-06-28 DIAGNOSIS — R293 Abnormal posture: Secondary | ICD-10-CM | POA: Diagnosis present

## 2018-06-28 DIAGNOSIS — Z720 Tobacco use: Secondary | ICD-10-CM | POA: Diagnosis not present

## 2018-06-28 DIAGNOSIS — Z803 Family history of malignant neoplasm of breast: Secondary | ICD-10-CM | POA: Insufficient documentation

## 2018-06-28 DIAGNOSIS — C50911 Malignant neoplasm of unspecified site of right female breast: Secondary | ICD-10-CM

## 2018-06-28 LAB — CBC WITH DIFFERENTIAL (CANCER CENTER ONLY)
Abs Immature Granulocytes: 0.02 10*3/uL (ref 0.00–0.07)
Basophils Absolute: 0.1 10*3/uL (ref 0.0–0.1)
Basophils Relative: 1 %
Eosinophils Absolute: 0.1 10*3/uL (ref 0.0–0.5)
Eosinophils Relative: 2 %
HCT: 40.7 % (ref 36.0–46.0)
Hemoglobin: 12.6 g/dL (ref 12.0–15.0)
Immature Granulocytes: 0 %
Lymphocytes Relative: 28 %
Lymphs Abs: 1.9 10*3/uL (ref 0.7–4.0)
MCH: 29 pg (ref 26.0–34.0)
MCHC: 31 g/dL (ref 30.0–36.0)
MCV: 93.8 fL (ref 80.0–100.0)
Monocytes Absolute: 0.6 10*3/uL (ref 0.1–1.0)
Monocytes Relative: 9 %
Neutro Abs: 4 10*3/uL (ref 1.7–7.7)
Neutrophils Relative %: 60 %
Platelet Count: 159 10*3/uL (ref 150–400)
RBC: 4.34 MIL/uL (ref 3.87–5.11)
RDW: 13.2 % (ref 11.5–15.5)
WBC Count: 6.7 10*3/uL (ref 4.0–10.5)
nRBC: 0 % (ref 0.0–0.2)

## 2018-06-28 LAB — CMP (CANCER CENTER ONLY)
ALK PHOS: 76 U/L (ref 38–126)
ALT: 11 U/L (ref 0–44)
AST: 13 U/L — AB (ref 15–41)
Albumin: 3.7 g/dL (ref 3.5–5.0)
Anion gap: 8 (ref 5–15)
BUN: 11 mg/dL (ref 8–23)
CALCIUM: 8.7 mg/dL — AB (ref 8.9–10.3)
CO2: 29 mmol/L (ref 22–32)
Chloride: 105 mmol/L (ref 98–111)
Creatinine: 0.65 mg/dL (ref 0.44–1.00)
GFR, Est AFR Am: >60 mL/min (ref >60)
Glucose, Bld: 105 mg/dL — ABNORMAL HIGH (ref 70–99)
Potassium: 4.3 mmol/L (ref 3.5–5.1)
Sodium: 142 mmol/L (ref 135–145)
Total Bilirubin: 0.4 mg/dL (ref 0.3–1.2)
Total Protein: 6.6 g/dL (ref 6.5–8.1)

## 2018-06-28 NOTE — Therapy (Signed)
[place], [place], [address] Phone: [phone]   Fax:  [phone]  Physical Therapy Evaluation  Patient Details  Name: Sabrina [name] MRN: [id number] Date of Birth: 09-06-1950 Referring Provider (PT): Dr. Erroll Luna   Encounter Date: 06/28/2018  PT End of Session - 06/28/18 1615    Visit Number  1    Number of Visits  2    Date for PT Re-Evaluation  08/23/18    PT Start Time  1412    PT Stop Time  1435    PT Time Calculation (min)  23 min    Activity Tolerance  Patient tolerated treatment well    Behavior During Therapy  [facility] for tasks assessed/performed       Past Medical History:  Diagnosis Date  . Atrial fibrillation ([place]) [I48.91] 11/08/2016  . Benign essential hypertension 08/25/2015  . Diabetes ([place])   . Hyperlipidemia   . Hypothyroidism due to Hashimoto's thyroiditis 05/23/2015    Past Surgical History:  Procedure Laterality Date  . ABDOMINAL HYSTERECTOMY    . APPENDECTOMY    . BREAST SURGERY    . CHOLECYSTECTOMY    . EYE SURGERY    . KNEE SURGERY      There were no vitals filed for this visit.   Subjective Assessment - 06/28/18 1544    Subjective  Patient reports she is here today to be seen by her medical team for her newly diagnosed right breast cancer.    Pertinent History  Patient was diagnosed on 06/20/2018 with right grade I invasive ductal carcinoma breast cancer. It measures 7 mm and is located in the upper outer quadrant. It is triple positive with a Ki67 of 10%. She also smokes 2 packs per day.    Patient Stated Goals  Reduce lymphedema risk and learn post op shoulder ROM HEP    Currently in Pain?  No/denies         [facility] PT Assessment - 06/28/18 0001      Assessment   Medical Diagnosis  Right breast cancer    Referring Provider (PT)  Dr. Marcello Moores Cornett    Onset Date/Surgical Date  06/20/18    Hand Dominance  Right    Prior Therapy  none      Precautions    Precautions  Other (comment)    Precaution Comments  active cancer      Restrictions   Weight Bearing Restrictions  No      Balance Screen   Has the patient fallen in the past 6 months  No    Has the patient had a decrease in activity level because of a fear of falling?   No    Is the patient reluctant to leave their home because of a fear of falling?   No      Home Environment   Living Environment  Private residence    Living Arrangements  Alone    Available Help at Discharge  Family      Prior Function   Level of [place]  Retired    Leisure  She walks some in good weather      Cognition   Overall Cognitive Status  Within Functional Limits for tasks assessed      Posture/Postural Control   Posture/Postural Control  Postural limitations    Postural Limitations  Rounded Shoulders;Forward head;Increased thoracic kyphosis      ROM / Strength   AROM /  PROM / Strength  AROM;Strength      AROM   AROM Assessment Site  Shoulder;Cervical    Right/Left Shoulder  Right;Left    Right Shoulder Extension  56 Degrees    Right Shoulder Flexion  158 Degrees    Right Shoulder ABduction  156 Degrees    Right Shoulder Internal Rotation  52 Degrees    Right Shoulder External Rotation  90 Degrees    Left Shoulder Extension  50 Degrees    Left Shoulder Flexion  142 Degrees    Left Shoulder ABduction  152 Degrees    Left Shoulder Internal Rotation  72 Degrees    Left Shoulder External Rotation  88 Degrees    Cervical Flexion  WNL    Cervical Extension  WNL    Cervical - Right Side Bend  WNL    Cervical - Left Side Bend  WNL    Cervical - Right Rotation  WNL    Cervical - Left Rotation  WNL      Strength   Overall Strength  Within functional limits for tasks performed        LYMPHEDEMA/ONCOLOGY QUESTIONNAIRE - 06/28/18 1611      Type   Cancer Type  Right breast cancer      Lymphedema Assessments   Lymphedema Assessments  Upper extremities       Right Upper Extremity Lymphedema   10 cm Proximal to Olecranon Process  28.6 cm    Olecranon Process  25.4 cm    10 cm Proximal to Ulnar Styloid Process  22.7 cm    Just Proximal to Ulnar Styloid Process  17.6 cm    Across Hand at [employer]  19.4 cm    At [place] of 2nd Digit  6.8 cm      Left Upper Extremity Lymphedema   10 cm Proximal to Olecranon Process  29.8 cm    Olecranon Process  27.2 cm    10 cm Proximal to Ulnar Styloid Process  22.5 cm    Just Proximal to Ulnar Styloid Process  16.9 cm    Across Hand at [employer]  19.5 cm    At [place] of 2nd Digit  6.8 cm          Quick Dash - 06/28/18 0001    Open a tight or new jar  No difficulty    Do heavy household chores (wash walls, wash floors)  No difficulty    Carry a shopping bag or briefcase  No difficulty    Wash your back  No difficulty    Use a knife to cut food  No difficulty    Recreational activities in which you take some force or impact through your arm, shoulder, or hand (golf, hammering, tennis)  No difficulty    During the past week, to what extent has your arm, shoulder or hand problem interfered with your normal social activities with family, friends, neighbors, or groups?  Not at all    During the past week, to what extent has your arm, shoulder or hand problem limited your work or other regular daily activities  Not at all    Arm, shoulder, or hand pain.  Mild    Tingling (pins and needles) in your arm, shoulder, or hand  None    Difficulty Sleeping  No difficulty    DASH Score  2.27 %        Objective measurements completed on examination: See above findings.  Patient was instructed today in a home exercise program today for post op shoulder range of motion. These included active assist shoulder flexion in sitting, scapular retraction, wall walking with shoulder abduction, and hands behind head external rotation.  She was encouraged to do these twice a day, holding 3 seconds and repeating 5  times when permitted by her physician.      PT Education - 06/28/18 1613    Education Details  Lymphedema risk reduction and post op shoulder ROM HEP    Person(s) Educated  Patient;Child(ren)    Methods  Explanation;Demonstration;Handout    Comprehension  Returned demonstration;Verbalized understanding          PT Long Term Goals - 06/28/18 1616      PT LONG TERM GOAL #1   Title  Patient will demonstrate she has regained full shoulder ROM and function post operatively compared to baseline assessments.    Time  8    Period  Weeks    Status  New      Breast Clinic Goals - 06/28/18 1616      Patient will be able to verbalize understanding of pertinent lymphedema risk reduction practices relevant to her diagnosis specifically related to skin care.   Time  1    Period  Days    Status  Achieved      Patient will be able to return demonstrate and/or verbalize understanding of the post-op home exercise program related to regaining shoulder range of motion.   Time  1    Period  Days    Status  Achieved      Patient will be able to verbalize understanding of the importance of attending the postoperative After Breast Cancer Class for further lymphedema risk reduction education and therapeutic exercise.   Time  1    Period  Days    Status  Achieved            Plan - 06/28/18 1543    Clinical Impression Statement  Patient was diagnosed on 06/20/2018 with right grade I invasive ductal carcinoma breast cancer. It measures 7 mm and is located in the upper outer quadrant. It is triple positive with a Ki67 of 10%. She also smokes 2 packs per day. Her multidisciplinary medical team met prior to her assessments to determine a recommended treatment plan She is planning to have a right lumpectomy and sentinel node biopsy followed by possible chemotherapy, radiation and anti-estrogen therapy. She will benefit from a post op PT reassessment to determine needs.    Stability/Clinical  Decision Making  Stable/Uncomplicated    Clinical Decision Making  Low    Rehab Potential  Excellent    PT Frequency  --   Eval and 1 f/u visit   PT Treatment/Interventions  ADLs/Self Care Home Management;Patient/family education;Therapeutic exercise    PT Next Visit Plan  Will reassess 3-4 weeks post op to determine needs    PT Home Exercise Plan  Post op shoulder ROM HEP    Consulted and Agree with Plan of Care  Patient;Family member/caregiver    Family Member Consulted  Daughter       Patient will benefit from skilled therapeutic intervention in order to improve the following deficits and impairments:  Impaired UE functional use, Decreased range of motion, Decreased knowledge of precautions, Postural dysfunction, Pain  Visit Diagnosis: Malignant neoplasm of upper-outer quadrant of right breast in female, estrogen receptor positive ([place]) - Plan: PT plan of care cert/re-cert  Abnormal posture - Plan: PT  plan of care cert/re-cert   Patient will follow up at outpatient cancer rehab 3-4 weeks following surgery.  If the patient requires physical therapy at that time, a specific plan will be dictated and sent to the referring physician for approval. The patient was educated today on appropriate basic range of motion exercises to begin post operatively and the importance of attending the After Breast Cancer class following surgery.  Patient was educated today on lymphedema risk reduction practices as it pertains to recommendations that will benefit the patient immediately following surgery.  She verbalized good understanding.      Problem List Patient Active Problem List   Diagnosis Date Noted  . Malignant neoplasm of upper-outer quadrant of right breast in female, estrogen receptor positive ([place]) 06/26/2018  . Smoking 07/12/2017  . Statin intolerance 12/08/2016  . Atrial fibrillation ([place]) [I48.91] 11/08/2016  . Long term (current) use of anticoagulants [Z79.01] 11/08/2016  . Encounter  for therapeutic drug monitoring 11/08/2016  . Moderate episode of recurrent major depressive disorder ([place]) 12/01/2015  . Benign essential hypertension 08/25/2015  . Hypothyroidism due to Hashimoto's thyroiditis 05/23/2015  . Type 2 diabetes mellitus without complication, without long-term current use of insulin ([place]) 05/23/2015  . Dyslipidemia 02/13/2015   Annia Friendly, PT 06/28/18 4:30 PM  [place], [place], [address] Phone: [phone]   Fax:  [phone]  Name: Nadja Lina MRN: [id number] Date of Birth: Jun 20, 1950

## 2018-06-28 NOTE — Progress Notes (Unsigned)
Nutrition Assessment  Reason for Assessment:  Pt seen in Breast Clinic  ASSESSMENT:  68 year old female with new diagnosis of breast cancer.  Past medical history reviewed.    Patient reports normal appetite  Medications:  reviewed  Labs: reviewed  Anthropometrics:   Height: 69.5 inches Weight: 183 lb 8 oz BMI: 26   NUTRITION DIAGNOSIS: Food and nutrition related knowledge deficit related to new diagnosis of breast cancer as evidenced by no prior need for nutrition related information.  INTERVENTION:   Discussed and provided packet of information regarding nutritional tips for breast cancer patients.  Questions answered.  Teachback method used.  Contact information provided and patient knows to contact me with questions/concerns.    MONITORING, EVALUATION, and GOAL: Pt will consume a healthy plant based diet to maintain lean body mass throughout treatment.   Sabrina [name], [place], [place] Registered Dietitian [phone] (pager)

## 2018-06-28 NOTE — Assessment & Plan Note (Signed)
06/19/2018:Screening mammogram detected right breast mass at 9 o'clock position 3 cm from the nipple, ultrasound revealed 7 x 5 x 5 mm mass, axillary ultrasound normal-appearing lymph nodes, ultrasound biopsy: Grade 1 IDC ER 100%, PR 10%, Ki-67 10%, HER-2 3+ positive, T1b N0 stage Ia  Pathology and radiology counseling: Discussed with the patient, the details of pathology including the type of breast cancer,the clinical staging, the significance of ER, PR and HER-2/neu receptors and the implications for treatment. After reviewing the pathology in detail, we proceeded to discuss the different treatment options between surgery, radiation, chemotherapy, antiestrogen therapies.  Treatment plan: 1.  Breast conserving surgery with sentinel lymph node biopsy 2.  Based on tumor size patient may need adjuvant chemotherapy with Taxol and Herceptin followed by Herceptin maintenance for 1 year if the final tumor size is greater than 5 mm 3.  Adjuvant radiation therapy 4.  Followed by adjuvant antiestrogen therapy  Return to clinic after surgery to discuss final pathology report.

## 2018-06-28 NOTE — H&P (View-Only) (Signed)
[name] Documented: 06/28/2018 11:25 AM Location: [facility] Patient #: [id number] DOB: [birth year] Undefined / Language: Sabrina [name] / Race: White Female  History of Present Illness Sabrina [name] A. Sabrina Byrer MD; 06/28/2018 3:18 PM) Patient words: pt sent at the request of Dr Stann Mainland for [name] abnormality of her right breast  7 mm mass right upper outer quadrant IDC 1 ER PR POS HER 2 NEU POS No hx of mass discharge or other complaints.  The patient is a 68 year old female.   Medication History [name], RN; 06/28/2018 11:26 AM) Medications Reconciled     Review of Systems ([name] MD; 06/28/2018 3:16 PM) All other systems negative   Physical Exam ([name] MD; 06/28/2018 3:20 PM)  General Mental Status-Alert. General Appearance-Consistent with stated age. Hydration-Well hydrated. Voice-Normal.  Head and Neck Head-normocephalic, atraumatic with no lesions or palpable masses. Trachea-midline. Thyroid Gland Characteristics - normal size and consistency.  Eye Eyeball - Bilateral-Extraocular movements intact. Sclera/Conjunctiva - Bilateral-No scleral icterus.  Chest and Lung Exam Chest and lung exam reveals -quiet, even and easy respiratory effort with no use of accessory muscles and on auscultation, normal breath sounds, no adventitious sounds and normal vocal resonance. Inspection Chest Wall - Normal. Back - normal.  Breast Breast - Left-Symmetric, Non Tender, No Biopsy scars, no Dimpling, No Inflammation, No Lumpectomy scars, No Mastectomy scars, No Peau d' Orange. Breast - Right-Symmetric, Non Tender, No Biopsy scars, no Dimpling, No Inflammation, No Lumpectomy scars, No Mastectomy scars, No Peau d' Orange. Breast Lump-No Palpable Breast Mass.  Cardiovascular Cardiovascular examination reveals -normal heart sounds, regular rate and rhythm with no murmurs and normal pedal pulses bilaterally. Note: a fib RATE  CONTROLLED  Neurologic Neurologic evaluation reveals -alert and oriented x 3 with no impairment of recent or remote memory. Mental Status-Normal.  Lymphatic Head & Neck  General Head & Neck Lymphatics: Bilateral - Description - Normal. Axillary  General Axillary Region: Bilateral - Description - Normal. Tenderness - Non Tender.    Assessment & Plan ([name] MD; 06/28/2018 3:21 PM)  BREAST CANCER, RIGHT (C50.911) Impression: pt opted for right breast lumpectomy and SLN mapping hold coumadin 5 days prior to surgery date Risk of lumpectomy include bleeding, infection, seroma, more surgery, use of seed/wire, wound care, cosmetic deformity and the need for other treatments, death , blood clots, death. Pt agrees to proceed. Risk of sentinel lymph node mapping include bleeding, infection, lymphedema, shoulder pain. stiffness, dye allergy. cosmetic deformity , blood clots, death, need for more surgery. Pt agres to proceed.  Current Plans You are being scheduled for surgery- Our schedulers will call you.  You should hear from our office's scheduling department within 5 working days about the location, date, and time of surgery. We try to make accommodations for patient's preferences in scheduling surgery, but sometimes the OR schedule or the surgeon's schedule prevents [place] from making those accommodations.  If you have not heard from our office [phone]) in 5 working days, call the office and ask for your surgeon's nurse.  If you have other questions about your diagnosis, plan, or surgery, call the office and ask for your surgeon's nurse.  Pt Education - [facility] Breast Cancer Information Given - [facility] "Breast Journey" Package Pt Education - Pamphlet Given - Breast Biopsy: discussed with patient and provided information. We discussed the staging and pathophysiology of breast cancer. We discussed all of the different options for treatment for breast cancer including surgery,  chemotherapy, radiation therapy, Herceptin, and  antiestrogen therapy. We discussed a sentinel lymph node biopsy as she does not appear to having lymph node involvement right now. We discussed the performance of that with injection of radioactive tracer and blue dye. We discussed that she would have an incision underneath her axillary hairline. We discussed that there is a bout a 10-20% chance of having a positive node with a sentinel lymph node biopsy and we will await the permanent pathology to make any other first further decisions in terms of her treatment. One of these options might be to return to the operating room to perform an axillary lymph node dissection. We discussed about a 1-2% risk lifetime of chronic shoulder pain as well as lymphedema associated with a sentinel lymph node biopsy. We discussed the options for treatment of the breast cancer which included lumpectomy versus a mastectomy. We discussed the performance of the lumpectomy with a wire placement. We discussed a 10-20% chance of a positive margin requiring reexcision in the operating room. We also discussed that she may need radiation therapy or antiestrogen therapy or both if she undergoes lumpectomy. We discussed the mastectomy and the postoperative care for that as well. We discussed that there is no difference in her survival whether she undergoes lumpectomy with radiation therapy or antiestrogen therapy versus a mastectomy. There is a slight difference in the local recurrence rate being 3-5% with lumpectomy and about 1% with a mastectomy. We discussed the risks of operation including bleeding, infection, possible reoperation. She understands her further therapy will be based on what her stages at the time of her operation.  Pt Education - flb breast cancer surgery: discussed with patient and provided information.

## 2018-06-28 NOTE — Patient Instructions (Signed)

## 2018-06-28 NOTE — H&P (Signed)
[name] Documented: 06/28/2018 11:25 AM Location: [facility] Patient #: [id number] DOB: 03-16-1951 Undefined / Language: Sabrina [name] / Race: White Female  History of Present Illness Marcello Moores A. Elyssa Pendelton MD; 06/28/2018 3:18 PM) Patient words: pt sent at the request of Dr Stann Mainland for [facility] abnormality of her right breast  7 mm mass right upper outer quadrant IDC 1 ER PR POS HER 2 NEU POS No hx of mass discharge or other complaints.  The patient is a 68 year old female.   Medication History [name], RN; 06/28/2018 11:26 AM) Medications Reconciled     Review of Systems (Verina Galeno A. Heavenly Christine MD; 06/28/2018 3:16 PM) All other systems negative   Physical Exam (Jaydenn Boccio A. Morgyn Marut MD; 06/28/2018 3:20 PM)  General Mental Status-Alert. General Appearance-Consistent with stated age. Hydration-Well hydrated. Voice-Normal.  Head and Neck Head-normocephalic, atraumatic with no lesions or palpable masses. Trachea-midline. Thyroid Gland Characteristics - normal size and consistency.  Eye Eyeball - Bilateral-Extraocular movements intact. Sclera/Conjunctiva - Bilateral-No scleral icterus.  Chest and Lung Exam Chest and lung exam reveals -quiet, even and easy respiratory effort with no use of accessory muscles and on auscultation, normal breath sounds, no adventitious sounds and normal vocal resonance. Inspection Chest Wall - Normal. Back - normal.  Breast Breast - Left-Symmetric, Non Tender, No Biopsy scars, no Dimpling, No Inflammation, No Lumpectomy scars, No Mastectomy scars, No Peau d' Orange. Breast - Right-Symmetric, Non Tender, No Biopsy scars, no Dimpling, No Inflammation, No Lumpectomy scars, No Mastectomy scars, No Peau d' Orange. Breast Lump-No Palpable Breast Mass.  Cardiovascular Cardiovascular examination reveals -normal heart sounds, regular rate and rhythm with no murmurs and normal pedal pulses bilaterally. Note: a fib RATE  CONTROLLED  Neurologic Neurologic evaluation reveals -alert and oriented x 3 with no impairment of recent or remote memory. Mental Status-Normal.  Lymphatic Head & Neck  General Head & Neck Lymphatics: Bilateral - Description - Normal. Axillary  General Axillary Region: Bilateral - Description - Normal. Tenderness - Non Tender.    Assessment & Plan (Capers Hagmann A. Itzael Liptak MD; 06/28/2018 3:21 PM)  BREAST CANCER, RIGHT (C50.911) Impression: pt opted for right breast lumpectomy and SLN mapping hold coumadin 5 days prior to surgery date Risk of lumpectomy include bleeding, infection, seroma, more surgery, use of seed/wire, wound care, cosmetic deformity and the need for other treatments, death , blood clots, death. Pt agrees to proceed. Risk of sentinel lymph node mapping include bleeding, infection, lymphedema, shoulder pain. stiffness, dye allergy. cosmetic deformity , blood clots, death, need for more surgery. Pt agres to proceed.  Current Plans You are being scheduled for surgery- Our schedulers will call you.  You should hear from our office's scheduling department within 5 working days about the location, date, and time of surgery. We try to make accommodations for patient's preferences in scheduling surgery, but sometimes the OR schedule or the surgeon's schedule prevents [place] from making those accommodations.  If you have not heard from our office [phone]) in 5 working days, call the office and ask for your surgeon's nurse.  If you have other questions about your diagnosis, plan, or surgery, call the office and ask for your surgeon's nurse.  Pt Education - [facility] Breast Cancer Information Given - [facility] "Breast Journey" Package Pt Education - Pamphlet Given - Breast Biopsy: discussed with patient and provided information. We discussed the staging and pathophysiology of breast cancer. We discussed all of the different options for treatment for breast cancer including surgery,  chemotherapy, radiation therapy, Herceptin, and  antiestrogen therapy. We discussed a sentinel lymph node biopsy as she does not appear to having lymph node involvement right now. We discussed the performance of that with injection of radioactive tracer and blue dye. We discussed that she would have an incision underneath her axillary hairline. We discussed that there is a bout a 10-20% chance of having a positive node with a sentinel lymph node biopsy and we will await the permanent pathology to make any other first further decisions in terms of her treatment. One of these options might be to return to the operating room to perform an axillary lymph node dissection. We discussed about a 1-2% risk lifetime of chronic shoulder pain as well as lymphedema associated with a sentinel lymph node biopsy. We discussed the options for treatment of the breast cancer which included lumpectomy versus a mastectomy. We discussed the performance of the lumpectomy with a wire placement. We discussed a 10-20% chance of a positive margin requiring reexcision in the operating room. We also discussed that she may need radiation therapy or antiestrogen therapy or both if she undergoes lumpectomy. We discussed the mastectomy and the postoperative care for that as well. We discussed that there is no difference in her survival whether she undergoes lumpectomy with radiation therapy or antiestrogen therapy versus a mastectomy. There is a slight difference in the local recurrence rate being 3-5% with lumpectomy and about 1% with a mastectomy. We discussed the risks of operation including bleeding, infection, possible reoperation. She understands her further therapy will be based on what her stages at the time of her operation.  Pt Education - flb breast cancer surgery: discussed with patient and provided information.

## 2018-06-29 ENCOUNTER — Encounter: Payer: Self-pay | Admitting: Genetic Counselor

## 2018-06-29 ENCOUNTER — Inpatient Hospital Stay (HOSPITAL_BASED_OUTPATIENT_CLINIC_OR_DEPARTMENT_OTHER): Payer: Medicare Other | Admitting: Genetic Counselor

## 2018-06-29 DIAGNOSIS — Z315 Encounter for genetic counseling: Secondary | ICD-10-CM | POA: Diagnosis not present

## 2018-06-29 DIAGNOSIS — Z809 Family history of malignant neoplasm, unspecified: Secondary | ICD-10-CM

## 2018-06-29 DIAGNOSIS — C50411 Malignant neoplasm of upper-outer quadrant of right female breast: Secondary | ICD-10-CM | POA: Diagnosis not present

## 2018-06-29 DIAGNOSIS — Z170 Estrogen receptor positive status [ER+]: Secondary | ICD-10-CM

## 2018-06-29 DIAGNOSIS — Z803 Family history of malignant neoplasm of breast: Secondary | ICD-10-CM

## 2018-06-29 DIAGNOSIS — Z8042 Family history of malignant neoplasm of prostate: Secondary | ICD-10-CM | POA: Insufficient documentation

## 2018-06-29 DIAGNOSIS — Z806 Family history of leukemia: Secondary | ICD-10-CM

## 2018-06-29 NOTE — Progress Notes (Signed)
REFERRING PROVIDER: Nicholas Lose, MD [place], [place] [id number]  PRIMARY PROVIDER:  Enid Skeens., MD  PRIMARY REASON FOR VISIT:  1. Malignant neoplasm of upper-outer quadrant of right breast in female, estrogen receptor positive ([place])   2. Family history of breast cancer   3. Family history of prostate cancer   4. Family history of leukemia      HISTORY OF PRESENT ILLNESS:   Sabrina [name], a 68 y.o. female, was seen for a [facility] cancer genetics consultation at the request of Dr. Lindi Adie due to a personal and family history of breast cancer.  Sabrina [name] presents to clinic today to discuss the possibility of a hereditary predisposition to cancer, genetic testing, and to further clarify her future cancer risks, as well as potential cancer risks for family members.   In March 2020, at the age of 72, Sabrina [name] was diagnosed with invasive ductal carcinoma of the right breast. The tumor is ER+/PR+/Her2-. The treatment plan includes lumpectomy, possible chemotherapy and radiation.    CANCER HISTORY:    Malignant neoplasm of upper-outer quadrant of right breast in female, estrogen receptor positive ([place])   06/19/2018 Initial Diagnosis    Screening mammogram detected right breast mass at 9 o'clock position 3 cm from the nipple, ultrasound revealed 7 x 5 x 5 mm mass, axillary ultrasound normal-appearing lymph nodes, ultrasound biopsy: Grade 1 IDC ER 100%, PR 10%, Ki-67 10%, HER-2 3+ positive, T1b N0 stage Ia    06/28/2018 Cancer Staging    Staging form: Breast, AJCC 8th Edition - Clinical stage from 06/28/2018: Stage IA (cT1b, cN0, cM0, G1, ER+, PR+, HER2+) - Signed by Sabrina Lose, MD on 06/28/2018      RISK FACTORS:  Menarche was at age 15.  First live birth at age 42.  OCP use for approximately 2 years.  Ovaries intact: one intact.  Hysterectomy: yes.  Menopausal status: postmenopausal.  HRT use: 0 years. Colonoscopy: yes; normal. Mammogram within the  last year: yes. Number of breast biopsies: 2. Up to date with pelvic exams: yes. Any excessive radiation exposure in the past: no  Past Medical History:  Diagnosis Date  . Atrial fibrillation ([place]) [I48.91] 11/08/2016  . Benign essential hypertension 08/25/2015  . Diabetes ([place])   . Family history of breast cancer   . Family history of leukemia   . Family history of prostate cancer   . Hyperlipidemia   . Hypothyroidism due to Hashimoto's thyroiditis 05/23/2015    Past Surgical History:  Procedure Laterality Date  . ABDOMINAL HYSTERECTOMY    . APPENDECTOMY    . BREAST SURGERY    . CHOLECYSTECTOMY    . EYE SURGERY    . KNEE SURGERY      Social History   Socioeconomic History  . Marital status: Widowed    Spouse name: Not on file  . Number of children: Not on file  . Years of education: Not on file  . Highest education level: Not on file  Occupational History  . Not on file  Social Needs  . Financial resource strain: Not on file  . Food insecurity:    Worry: Not on file    Inability: Not on file  . Transportation needs:    Medical: Not on file    Non-medical: Not on file  Tobacco Use  . Smoking status: Current Every Day Smoker    Packs/day: 2.00  . Smokeless tobacco: Never Used  Substance and Sexual Activity  . Alcohol  use: Never    Frequency: Never  . Drug use: Never  . Sexual activity: Not on file  Lifestyle  . Physical activity:    Days per week: Not on file    Minutes per session: Not on file  . Stress: Not on file  Relationships  . Social connections:    Talks on phone: Not on file    Gets together: Not on file    Attends religious service: Not on file    Active member of club or organization: Not on file    Attends meetings of clubs or organizations: Not on file    Relationship status: Not on file  Other Topics Concern  . Not on file  Social History Narrative  . Not on file     FAMILY HISTORY:  We obtained a detailed, 4-generation family  history.  Significant diagnoses are listed below: Family History  Problem Relation Age of Onset  . Prostate cancer Father   . Cancer Brother   . Acute myelogenous leukemia Brother   . Alcohol abuse Maternal Aunt   . Hypertension Maternal Aunt   . Breast cancer Maternal Aunt        dx over 76  . Alcohol abuse Paternal Uncle   . Diabetes Paternal Uncle   . Hypertension Maternal Grandmother   . Breast cancer Mother 39       Deceased at 12  . Cancer Maternal Uncle   . Diabetes Maternal Uncle   . Alcohol abuse Maternal Uncle   . Diabetes Son   . Breast cancer Cousin        dx over 108  . Breast cancer Cousin 66    The patient has two children who are cancer free.  She had one brother who died of AML at 66.  Both parents are deceased.  The patient's mother was diagnosed with breast cancer at 74 and died at 32.  She had 13 siblings, one sister had breast cancer over 59 and her daughter was diagnosed with breast cancer.  Another cousin had a daughter with breast cancer at 89.  The maternal grandparents are deceased from non cancer related issues.  The patients father had prostate cancer at 27.  He had two brothers who are cancer free.  His parents are deceased.  Sabrina [name] is unaware of previous family history of genetic testing for hereditary cancer risks. Patient's maternal ancestors are of [place] descent, and paternal ancestors are of [place] descent. There is no reported Ashkenazi Jewish ancestry. There is no known consanguinity.  GENETIC COUNSELING ASSESSMENT: Sabrina [name] is a 68 y.o. female with a personal and family history of breast cancer which is somewhat suggestive of a hereditary cancer syndrome and predisposition to cancer. We, therefore, discussed and recommended the following at today's visit.   DISCUSSION: We discussed that 5 - 10% of breast cancer is hereditary, with most cases associated with BRCA mutations.  There are other genes that can be associated with hereditary breast  cancer syndromes.  These include ATM, CHEK2 and PALB2.  We discussed that about 7% of the leukemia population will have ATM mutations.  Therefore this is an important gene to look at.  We reviewed the characteristics, features and inheritance patterns of hereditary cancer syndromes. We also discussed genetic testing, including the appropriate family members to test, the process of testing, insurance coverage and turn-around-time for results. We discussed the implications of a negative, positive and/or variant of uncertain significant result. In order to get genetic  test results in a timely manner so that Sabrina [name] can use these genetic test results for surgical decisions, we recommended Sabrina [name] pursue genetic testing for the 9-gene STAT panel. Once complete, we recommend Sabrina [name] pursue reflex genetic testing to the common hereditary cancer gene panel. The Hereditary Gene Panel offered by [facility] includes sequencing and/or deletion duplication testing of the following 48 genes: APC, ATM, AXIN2, BARD1, BMPR1A, BRCA1, BRCA2, BRIP1, CDH1, CDK4, CDKN2A (p14ARF), CDKN2A (p16INK4a), CHEK2, CTNNA1, DICER1, EPCAM (Deletion/duplication testing only), GREM1 (promoter region deletion/duplication testing only), KIT, MEN1, MLH1, MSH2, MSH3, MSH6, MUTYH, NBN, NF1, NHTL1, PALB2, PDGFRA, PMS2, POLD1, POLE, PTEN, RAD50, RAD51C, RAD51D, RNF43, SDHB, SDHC, SDHD, SMAD4, SMARCA4. STK11, TP53, TSC1, TSC2, and VHL.  The following genes were evaluated for sequence changes only: SDHA and HOXB13 c.251G>A variant only.   Based on Ms. Baccam's personal and family history of cancer, she meets medical criteria for genetic testing. Despite that she meets criteria, she may still have an out of pocket cost.   PLAN: After considering the risks, benefits, and limitations, Sabrina [name] provided informed consent to pursue genetic testing and the blood sample was sent to [facility] for analysis of the STAT panel and common hereditary  cancer panel. Results should be available within approximately 5-10 days for the STAT panel and an additional 1-2 weeks' time, at which point they will be disclosed by telephone to Sabrina [name], as will any additional recommendations warranted by these results. Sabrina [name] will receive a summary of her genetic counseling visit and a copy of her results once available. This information will also be available in Epic.   Lastly, we encouraged Sabrina [name] to remain in contact with cancer genetics annually so that we can continuously update the family history and inform her of any changes in cancer genetics and testing that may be of benefit for this family.   Sabrina [name] questions were answered to her satisfaction today. Our contact information was provided should additional questions or concerns arise. Thank you for the referral and allowing [place] to share in the care of your patient.   Karen P. Florene Glen, [place], [facility] Certified Genetic Counselor [email or web address] phone: [phone]  The patient was seen for a total of 45 minutes in face-to-face genetic counseling.  This patient was discussed with Drs. Magrinat, Lindi Adie and/or Burr Medico who agrees with the above.    _______________________________________________________________________ For Office Staff:  Number of people involved in session: 1 Was an Intern/ student involved with case: no

## 2018-07-04 NOTE — Progress Notes (Signed)
Sabrina [name] pt for follow-up, due to not being able to connect during Breast Clinic visit on 06/28/2018. Pt reported that she is "doing as well as could be expected under the circumstances." She stated that her primary concern is stress and frustration related to her surgery not being scheduled yet. She expressed that she feels well supported by her daughter, son, and friends. She identified that she has access to pastoral care through her family [employer]. The counselor provided brief information about the [facility] and invited the pt to reach out if any needs arise. The pt reported that she will contact the support center if needed. The pt was also sent resource packet detailing support programs.  Doris Cheadle, Counseling Intern [phone]

## 2018-07-05 ENCOUNTER — Encounter: Payer: Self-pay | Admitting: Genetic Counselor

## 2018-07-05 ENCOUNTER — Telehealth: Payer: Self-pay | Admitting: Genetic Counselor

## 2018-07-05 ENCOUNTER — Ambulatory Visit: Payer: Self-pay | Admitting: Genetic Counselor

## 2018-07-05 DIAGNOSIS — Z1379 Encounter for other screening for genetic and chromosomal anomalies: Secondary | ICD-10-CM

## 2018-07-05 DIAGNOSIS — Z170 Estrogen receptor positive status [ER+]: Principal | ICD-10-CM

## 2018-07-05 DIAGNOSIS — Z01810 Encounter for preprocedural cardiovascular examination: Secondary | ICD-10-CM

## 2018-07-05 DIAGNOSIS — C50411 Malignant neoplasm of upper-outer quadrant of right female breast: Secondary | ICD-10-CM

## 2018-07-05 HISTORY — DX: Encounter for preprocedural cardiovascular examination: Z01.810

## 2018-07-05 HISTORY — DX: Encounter for other screening for genetic and chromosomal anomalies: Z13.79

## 2018-07-05 NOTE — Telephone Encounter (Signed)
Revealed negative genetic testing.  Discussed that we do not know why she has breast cancer or why there is cancer in the family. It could be due to a different gene that we are not testing, or maybe our current technology may not be able to pick something up.  It will be important for her to keep in contact with genetics to keep up with whether additional testing may be needed. 

## 2018-07-05 NOTE — Progress Notes (Signed)
HPI:  Sabrina [name] was previously seen in the [facility] clinic due to a personal and family history of breast cancer and concerns regarding a hereditary predisposition to cancer. Please refer to our prior cancer genetics clinic note for more information regarding our discussion, assessment and recommendations, at the time. Sabrina [name] recent genetic test results were disclosed to her, as were recommendations warranted by these results. These results and recommendations are discussed in more detail below.  CANCER HISTORY:    Malignant neoplasm of upper-outer quadrant of right breast in female, estrogen receptor positive ([place])   06/19/2018 Initial Diagnosis    Screening mammogram detected right breast mass at 9 o'clock position 3 cm from the nipple, ultrasound revealed 7 x 5 x 5 mm mass, axillary ultrasound normal-appearing lymph nodes, ultrasound biopsy: Grade 1 IDC ER 100%, PR 10%, Ki-67 10%, HER-2 3+ positive, T1b N0 stage Ia    06/28/2018 Cancer Staging    Staging form: Breast, AJCC 8th Edition - Clinical stage from 06/28/2018: Stage IA (cT1b, cN0, cM0, G1, ER+, PR+, HER2+) - Signed by Nicholas Lose, MD on 06/28/2018    07/05/2018 Genetic Testing    Negative genetic testing on the common hereditary cancer panel.  The Hereditary Gene Panel offered by [facility] includes sequencing and/or deletion duplication testing of the following 48 genes: APC, ATM, AXIN2, BARD1, BMPR1A, BRCA1, BRCA2, BRIP1, CDH1, CDK4, CDKN2A (p14ARF), CDKN2A (p16INK4a), CHEK2, CTNNA1, DICER1, EPCAM (Deletion/duplication testing only), GREM1 (promoter region deletion/duplication testing only), KIT, MEN1, MLH1, MSH2, MSH3, MSH6, MUTYH, NBN, NF1, NHTL1, PALB2, PDGFRA, PMS2, POLD1, POLE, PTEN, RAD50, RAD51C, RAD51D, RNF43, SDHB, SDHC, SDHD, SMAD4, SMARCA4. STK11, TP53, TSC1, TSC2, and VHL.  The following genes were evaluated for sequence changes only: SDHA and HOXB13 c.251G>A variant only. The report date is July 05, 2018.    FAMILY HISTORY:  We obtained a detailed, 4-generation family history.  Significant diagnoses are listed below: Family History  Problem Relation Age of Onset  . Prostate cancer Father   . Cancer Brother   . Acute myelogenous leukemia Brother   . Alcohol abuse Maternal Aunt   . Hypertension Maternal Aunt   . Breast cancer Maternal Aunt        dx over 75  . Alcohol abuse Paternal Uncle   . Diabetes Paternal Uncle   . Hypertension Maternal Grandmother   . Breast cancer Mother 83       Deceased at 8  . Cancer Maternal Uncle   . Diabetes Maternal Uncle   . Alcohol abuse Maternal Uncle   . Diabetes Son   . Breast cancer Cousin        dx over 47  . Breast cancer Cousin 19    The patient has two children who are cancer free.  She had one brother who died of AML at 11.  Both parents are deceased.  The patient's mother was diagnosed with breast cancer at 76 and died at 55.  She had 13 siblings, one sister had breast cancer over 76 and her daughter was diagnosed with breast cancer.  Another cousin had a daughter with breast cancer at 84.  The maternal grandparents are deceased from non cancer related issues.  The patients father had prostate cancer at 65.  He had two brothers who are cancer free.  His parents are deceased.  Sabrina [name] is unaware of previous family history of genetic testing for hereditary cancer risks. Patient's maternal ancestors are of [place] descent, and paternal ancestors are of [place]  descent. There is no reported Ashkenazi Jewish ancestry. There is no known consanguinity.  GENETIC TEST RESULTS: Genetic testing reported out on July 05, 2018 through the common hereditary cancer panel found no pathogenic mutations. The Hereditary Gene Panel offered by [facility] includes sequencing and/or deletion duplication testing of the following 48 genes: APC, ATM, AXIN2, BARD1, BMPR1A, BRCA1, BRCA2, BRIP1, CDH1, CDK4, CDKN2A (p14ARF), CDKN2A (p16INK4a), CHEK2, CTNNA1, DICER1,  EPCAM (Deletion/duplication testing only), GREM1 (promoter region deletion/duplication testing only), KIT, MEN1, MLH1, MSH2, MSH3, MSH6, MUTYH, NBN, NF1, NHTL1, PALB2, PDGFRA, PMS2, POLD1, POLE, PTEN, RAD50, RAD51C, RAD51D, RNF43, SDHB, SDHC, SDHD, SMAD4, SMARCA4. STK11, TP53, TSC1, TSC2, and VHL.  The following genes were evaluated for sequence changes only: SDHA and HOXB13 c.251G>A variant only. The test report has been scanned into EPIC and is located under the Molecular Pathology section of the Results Review tab.  A portion of the result report is included below for reference.     We discussed with Sabrina [name] that because current genetic testing is not perfect, it is possible there may be a gene mutation in one of these genes that current testing cannot detect, but that chance is small.  We also discussed, that there could be another gene that has not yet been discovered, or that we have not yet tested, that is responsible for the cancer diagnoses in the family. It is also possible there is a hereditary cause for the cancer in the family that Sabrina [name] did not inherit and therefore was not identified in her testing.  Therefore, it is important to remain in touch with cancer genetics in the future so that we can continue to offer Sabrina [name] the most up to date genetic testing.   ADDITIONAL GENETIC TESTING: We discussed with Sabrina [name] that her genetic testing was fairly extensive.  If there are genes identified to increase cancer risk that can be analyzed in the future, we would be happy to discuss and coordinate this testing at that time.    CANCER SCREENING RECOMMENDATIONS: Sabrina [name] test result is considered negative (normal).  This means that we have not identified a hereditary cause for her personal and family history of cancer at this time. Most cancers happen by chance and this negative test suggests that her cancer may fall into this category.    While reassuring, this does not definitively  rule out a hereditary predisposition to cancer. It is still possible that there could be genetic mutations that are undetectable by current technology. There could be genetic mutations in genes that have not been tested or identified to increase cancer risk.  Therefore, it is recommended she continue to follow the cancer management and screening guidelines provided by her oncology and primary healthcare provider.   An individual's cancer risk and medical management are not determined by genetic test results alone. Overall cancer risk assessment incorporates additional factors, including personal medical history, family history, and any available genetic information that may result in a personalized plan for cancer prevention and surveillance  RECOMMENDATIONS FOR FAMILY MEMBERS:  Individuals in this family might be at some increased risk of developing cancer, over the general population risk, simply due to the family history of cancer.  We recommended women in this family have a yearly mammogram beginning at age 53, or 40 years younger than the earliest onset of cancer, an annual clinical breast exam, and perform monthly breast self-exams. Women in this family should also have a gynecological exam as recommended by their primary provider.  All family members should have a colonoscopy by age 18.  FOLLOW-UP: Lastly, we discussed with Sabrina [name] that cancer genetics is a rapidly advancing field and it is possible that new genetic tests will be appropriate for her and/or her family members in the future. We encouraged her to remain in contact with cancer genetics on an annual basis so we can update her personal and family histories and let her know of advances in cancer genetics that may benefit this family.   Our contact number was provided. Sabrina [name] questions were answered to her satisfaction, and she knows she is welcome to call us at anytime with additional questions or concerns.   Roma Kayser, MS, [facility]  Certified Genetic Counselor [email or web address]

## 2018-07-07 ENCOUNTER — Telehealth: Payer: Self-pay | Admitting: Cardiology

## 2018-07-07 ENCOUNTER — Encounter: Payer: Self-pay | Admitting: Cardiology

## 2018-07-07 ENCOUNTER — Other Ambulatory Visit: Payer: Self-pay

## 2018-07-07 ENCOUNTER — Ambulatory Visit (INDEPENDENT_AMBULATORY_CARE_PROVIDER_SITE_OTHER): Payer: Medicare Other | Admitting: Cardiology

## 2018-07-07 ENCOUNTER — Telehealth: Payer: Self-pay | Admitting: *Deleted

## 2018-07-07 ENCOUNTER — Telehealth: Payer: Self-pay | Admitting: Emergency Medicine

## 2018-07-07 ENCOUNTER — Encounter (HOSPITAL_BASED_OUTPATIENT_CLINIC_OR_DEPARTMENT_OTHER): Payer: Self-pay | Admitting: *Deleted

## 2018-07-07 ENCOUNTER — Other Ambulatory Visit: Payer: Self-pay | Admitting: Surgery

## 2018-07-07 VITALS — Wt 181.0 lb

## 2018-07-07 DIAGNOSIS — I480 Paroxysmal atrial fibrillation: Secondary | ICD-10-CM | POA: Diagnosis not present

## 2018-07-07 DIAGNOSIS — C50911 Malignant neoplasm of unspecified site of right female breast: Secondary | ICD-10-CM

## 2018-07-07 DIAGNOSIS — E785 Hyperlipidemia, unspecified: Secondary | ICD-10-CM

## 2018-07-07 DIAGNOSIS — Z170 Estrogen receptor positive status [ER+]: Principal | ICD-10-CM

## 2018-07-07 DIAGNOSIS — F17200 Nicotine dependence, unspecified, uncomplicated: Secondary | ICD-10-CM

## 2018-07-07 DIAGNOSIS — I10 Essential (primary) hypertension: Secondary | ICD-10-CM | POA: Diagnosis not present

## 2018-07-07 NOTE — Telephone Encounter (Signed)
Virtual Visit Pre-Appointment Phone Call  Steps For Call:  1. Confirm consent - "In the setting of the current Covid19 crisis, you are scheduled for a (phone or video) visit with your provider on (date) at (time).  Just as we do with many in-office visits, in order for you to participate in this visit, we must obtain consent.  If you'd like, I can send this to your mychart (if signed up) or email for you to review.  Otherwise, I can obtain your verbal consent now.  All virtual visits are billed to your insurance company just like a normal visit would be.  By agreeing to a virtual visit, we'd like you to understand that the technology does not allow for your provider to perform an examination, and thus may limit your provider's ability to fully assess your condition.  Finally, though the technology is pretty good, we cannot assure that it will always work on either your or our end, and in the setting of a video visit, we may have to convert it to a phone-only visit.  In either situation, we cannot ensure that we have a secure connection.  Are you willing to proceed?"  2. Give patient instructions for WebEx download to smartphone as below if video visit  3. Advise patient to be prepared with any vital sign or heart rhythm information, their current medicines, and a piece of paper and pen handy for any instructions they may receive the day of their visit  4. Inform patient they will receive a phone call 15 minutes prior to their appointment time (may be from unknown caller ID) so they should be prepared to answer  5. Confirm that appointment type is correct in Epic appointment notes (video vs telephone)    TELEPHONE CALL NOTE  Saydi Kobel has been deemed a candidate for a follow-up tele-health visit to limit community exposure during the Covid-19 pandemic. I spoke with the patient via phone to ensure availability of phone/video source, confirm preferred email & phone number, and discuss  instructions and expectations.  I reminded Leona Alen to be prepared with any vital sign and/or heart rhythm information that could potentially be obtained via home monitoring, at the time of her visit. I reminded Alliana Mcauliff to expect a phone call at the time of her visit if her visit.  Did the patient verbally acknowledge consent to treatment?yes Frederic Jericho 07/07/2018 2:40 PM   DOWNLOADING THE [place], go to [employer] and type in WebEx in the search bar. [place] [employer], the blue/green circle. The app is free but as with any other app downloads, their phone may require them to verify saved payment information or Apple password. The patient does NOT have to create an account.  - If Android, ask patient to go to [employer] and type in WebEx in the search bar. [place] [employer], the blue/green circle. The app is free but as with any other app downloads, their phone may require them to verify saved payment information or Android password. The patient does NOT have to create an account.   CONSENT FOR TELE-HEALTH VISIT - PLEASE REVIEW  I hereby voluntarily request, consent and authorize [facility] and its employed or contracted physicians, physician assistants, nurse practitioners or other licensed health care professionals (the Practitioner), to provide me with telemedicine health care services (the Services") as deemed necessary by the treating Practitioner. I acknowledge and consent to receive  the Services by the Practitioner via telemedicine. I understand that the telemedicine visit will involve communicating with the Practitioner through live audiovisual communication technology and the disclosure of certain medical information by electronic transmission. I acknowledge that I have been given the opportunity to request an in-person assessment or other available alternative prior to the telemedicine visit and am  voluntarily participating in the telemedicine visit.  I understand that I have the right to withhold or withdraw my consent to the use of telemedicine in the course of my care at any time, without affecting my right to future care or treatment, and that the Practitioner or I may terminate the telemedicine visit at any time. I understand that I have the right to inspect all information obtained and/or recorded in the course of the telemedicine visit and may receive copies of available information for a reasonable fee.  I understand that some of the potential risks of receiving the Services via telemedicine include:   Delay or interruption in medical evaluation due to technological equipment failure or disruption;  Information transmitted may not be sufficient (e.g. poor resolution of images) to allow for appropriate medical decision making by the Practitioner; and/or   In rare instances, security protocols could fail, causing a breach of personal health information.  Furthermore, I acknowledge that it is my responsibility to provide information about my medical history, conditions and care that is complete and accurate to the best of my ability. I acknowledge that Practitioner's advice, recommendations, and/or decision may be based on factors not within their control, such as incomplete or inaccurate data provided by me or distortions of diagnostic images or specimens that may result from electronic transmissions. I understand that the practice of medicine is not an exact science and that Practitioner makes no warranties or guarantees regarding treatment outcomes. I acknowledge that I will receive a copy of this consent concurrently upon execution via email to the email address I last provided but may also request a printed copy by calling the office of [facility].    I understand that my insurance will be billed for this visit.   I have read or had this consent read to me.  I understand the  contents of this consent, which adequately explains the benefits and risks of the Services being provided via telemedicine.   I have been provided ample opportunity to ask questions regarding this consent and the Services and have had my questions answered to my satisfaction.  I give my informed consent for the services to be provided through the use of telemedicine in my medical care  By participating in this telemedicine visit I agree to the above.

## 2018-07-07 NOTE — Patient Instructions (Signed)
Medication Instructions:  Your physician recommends that you continue on your current medications as directed. Please refer to the Current Medication list given to you today.  If you need a refill on your cardiac medications before your next appointment, please call your pharmacy.   Lab work: None.  If you have labs (blood work) drawn today and your tests are completely normal, you will receive your results only by: . MyChart Message (if you have MyChart) OR . A paper copy in the mail If you have any lab test that is abnormal or we need to change your treatment, we will call you to review the results.  Testing/Procedures: None   Follow-Up: At [facility], you and your health needs are our priority.  As part of our continuing mission to provide you with exceptional heart care, we have created designated Provider Care Teams.  These Care Teams include your primary Cardiologist (physician) and Advanced Practice Providers (APPs -  Physician Assistants and Nurse Practitioners) who all work together to provide you with the care you need, when you need it. You will need a follow up appointment in 2 months.  Please call our office 2 months in advance to schedule this appointment.  You may see No primary care provider on file. or another member of our [facility] Provider Team in [place]: Brian Munley, MD . Rajan Revankar, MD  Any Other Special Instructions Will Be Listed Below (If Applicable).    

## 2018-07-07 NOTE — Telephone Encounter (Signed)
Patient needs televisit per Dr. Agustin Cree

## 2018-07-07 NOTE — Telephone Encounter (Signed)
Pt need surgical clearance signed and sent back asap. Put form on Dr Marthann Schiller desk

## 2018-07-07 NOTE — Progress Notes (Signed)
Virtual Visit via Video Note    Evaluation Performed:  Follow-up visit  This visit type was conducted due to national recommendations for restrictions regarding the COVID-19 Pandemic (e.g. social distancing).  This format is felt to be most appropriate for this patient at this time.  All issues noted in this document were discussed and addressed.  No physical exam was performed (except for noted visual exam findings with Video Visits).  Please refer to the patient's chart (MyChart message for video visits and phone note for telephone visits) for the patient's consent to telehealth for [facility].  Date:  07/07/2018  ID: Sabrina [name], DOB 1950/09/24, MRN [id number]   Patient Location:  [place] [address]   Provider location:   [place] Office  PCP:  Enid Skeens., MD  Cardiologist:  Jenne Campus, MD     Chief Complaint: I need breast surgery  History of Present Illness:    Sabrina [name] is a 68 y.o. female  who presents via audio/video conferencing for a telehealth visit today.  She does have history of paroxysmal atrial fibrillation, hypertension, diabetes, smoking.  She requested tele-visit today because she required breast surgery for breast cancer.  Apparently mammogram that she had done last year was good have her mammogram done this time showed some changes and a lumpectomy with possible lymph nodes resection is being planned.  Overall she is doing well of course she is devastated with diagnosis she said she stressed out a lot because of coronavirus situation as well as the fact she was just diagnosed with breast cancer.  She denies having any chest pain tightness squeezing pressure burning chest.  She can walk with no difficulties she can walk from front to the back of [employer] with no problem when asked about potentially climbing stairs she simply answered that she does not have a study so she does not know how well she can do it but  whenever she does it she is fairly energetic and have no cardiac symptoms.  Shortness of breath is there is a chronic problem.  She continues to smoke and now she admits that she smoked much more than usually because of all the stress of the situation.  I reviewed her laboratory test her echocardiogram has been done at the end of last year which showed preserved left ventricular ejection fraction.  At the same time she got carotic ultrasound done there is no recent evaluation of coronary artery status however she is asymptomatic and quite decent exercise tolerance.   The patient does not symptoms concerning for COVID-19 infection (fever, chills, cough, or new SHORTNESS OF BREATH).    Prior CV studies:   The following studies were reviewed today:  Echocardiogram done in November 2019 showed:  Study Conclusions  - Left ventricle: The cavity size was normal. There was moderate   concentric hypertrophy. Systolic function was normal. The   estimated ejection fraction was in the range of 55% to 60%. Wall   motion was normal; there were no regional wall motion   abnormalities. Features are consistent with a pseudonormal left   ventricular filling pattern, with concomitant abnormal relaxation   and increased filling pressure (grade 2 diastolic dysfunction). 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 - Left atrium: The atrium was mildly dilated.  Carotid ultrasound done in November 2019 showed:  Summary: Right Carotid: There is no evidence of stenosis in the right ICA. The  extracranial vessels were near-normal with only minimal wall                thickening or plaque.  Left Carotid: Velocities in the left ICA are consistent with a 1-39% stenosis.               The ECA appears >50% stenosed.  Vertebrals:  Bilateral vertebral arteries demonstrate antegrade flow. Subclavians: Normal flow hemodynamics were seen in bilateral subclavian               arteries.    Past Medical History:  Diagnosis Date  . Atrial fibrillation ([place]) [I48.91] 11/08/2016  . Benign essential hypertension 08/25/2015  . Diabetes ([place])   . Family history of breast cancer   . Family history of leukemia   . Family history of prostate cancer   . Hyperlipidemia   . Hypothyroidism due to Hashimoto's thyroiditis 05/23/2015    Past Surgical History:  Procedure Laterality Date  . ABDOMINAL HYSTERECTOMY    . APPENDECTOMY    . BREAST SURGERY    . CHOLECYSTECTOMY    . EYE SURGERY    . KNEE SURGERY       Current Meds  Medication Sig  . digoxin (DIGOX) 0.125 MG tablet Take 1 tablet (125 mcg total) by mouth daily.  Marland Kitchen escitalopram (LEXAPRO) 10 MG tablet Take 1 tablet by mouth daily.  . flecainide (TAMBOCOR) 100 MG tablet Take 1 tablet (100 mg total) by mouth 2 (two) times daily.  Marland Kitchen levothyroxine (SYNTHROID, LEVOTHROID) 100 MCG tablet Take 1 tablet by mouth daily.  Marland Kitchen lisinopril (PRINIVIL,ZESTRIL) 10 MG tablet Take 1 tablet (10 mg total) by mouth daily.  . metFORMIN (GLUCOPHAGE) 500 MG tablet Take 1 tablet by mouth 2 (two) times daily with a meal.   . metoprolol succinate (TOPROL-XL) 50 MG 24 hr tablet Take 1 tablet (50 mg total) by mouth daily.  Marland Kitchen warfarin (COUMADIN) 10 MG tablet Take as directed by Coumadin Clinic      Family History: The patient's family history includes Acute myelogenous leukemia in her brother; Alcohol abuse in her maternal aunt, maternal uncle, and paternal uncle; Breast cancer in her cousin and maternal aunt; Breast cancer (age of onset: 74) in her cousin; Breast cancer (age of onset: 71) in her mother; Cancer in her brother and maternal uncle; Diabetes in her maternal uncle, paternal uncle, and son; Hypertension in her maternal aunt and maternal grandmother; Prostate cancer in her father.   ROS:   Please see the history of present illness.     All other systems reviewed and are negative.   Labs/Other Tests and Data Reviewed:      Recent Labs: 06/28/2018: ALT 11; BUN 11; Creatinine 0.65; Hemoglobin 12.6; Platelet Count 159; Potassium 4.3; Sodium 142  Recent Lipid Panel No results found for: CHOL, TRIG, HDL, CHOLHDL, VLDL, LDLCALC, LDLDIRECT    Exam:    Vital Signs:  Wt 141 lb (64 kg)   BMI 24.20 kg/m    Well nourished, well developed female in no acute distress. Does not have any swelling.  She is crying during the visit.  Obviously she is very concerned about her breast cancer that she was diagnosed with.  Diagnosis for this visit:   1. Benign essential hypertension   2. Paroxysmal atrial fibrillation (HCC)   3. Smoking   4. Dyslipidemia      ASSESSMENT & PLAN:    1.  Benign essential hypertension blood pressure well controlled we will continue present management. 2.  Paroxysmal atrial fibrillation she is anticoagulated with Coumadin as per her wishes we discussed multiple times about switching her to newer agents she did not want to do it.  Because of surgery we need to withdraw Coumadin for 5 days before procedure.  Her chads 2 Vascor equals 4.  She is on flecainide she takes 100 mg twice daily which I will continue.  If she will develop episode of atrial fibrillation during the surgery around surgical time either calcium channel blocker or beta-blocker can be used to control her ventricular rate. 3.  Smoking obviously the problem she admits that she smokes much more now than before because of told her stressful situation 4.  Dyslipidemia he is allergic to statin.  She did not want to consider PCSK9 agent or Zetia.   COVID-19 Education: The signs and symptoms of COVID-19 were discussed with the patient and how to seek care for testing (follow up with PCP or arrange E-visit).  The importance of social distancing was discussed today.  Patient Risk:   After full review of this patients clinical status, I feel that they are at least moderate risk at this time.  Time:   Today, I have spent 15 minutes in  direct face-to-face contact with the patient with telehealth technology discussing pt health issues.  I spent 15 minutes reviewing her chart before the visit.    Medication Adjustments/Labs and Tests Ordered: Current medicines are reviewed at length with the patient today.  Concerns regarding medicines are outlined above.  No orders of the defined types were placed in this encounter.  Medication changes: No orders of the defined types were placed in this encounter.    Disposition: We will follow-up with her with video link in 2 months  Signed, Park Liter, MD, [facility] 07/07/2018 2:57 PM    [place]

## 2018-07-07 NOTE — Telephone Encounter (Signed)
Left message for patient to return call to be scheduled for a 2 month televisit follow up appointment.

## 2018-07-07 NOTE — Telephone Encounter (Signed)
Left vm for pt to return call regarding [place] from 3.18.20. Contact information provided for questions.

## 2018-07-07 NOTE — Telephone Encounter (Signed)
YOUR CARDIOLOGY TEAM HAS ARRANGED FOR AN E-VISIT FOR YOUR APPOINTMENT - PLEASE REVIEW IMPORTANT INFORMATION BELOW SEVERAL DAYS PRIOR TO YOUR APPOINTMENT  Due to the recent COVID-19 pandemic, we are transitioning in-person office visits to tele-medicine visits in an effort to decrease unnecessary exposure to our patients and staff. Medicare and most insurances are covering these visits without a copay needed. You will need a working email and a smartphone or computer with a camera and microphone. For patients that do not have these items, we can still complete the visit using a telephone but do prefer video when possible. If possible, we also ask that you have a blood pressure cuff and scale at home to measure your blood pressure, heart rate and weight prior to your scheduled appointment. Patients with clinical needs that need an in-person evaluation and testing will still be able to come to the office if absolutely necessary. If you have any questions, feel free to call our office.     DOWNLOADING THE SOFTWARE  Download the [employer] WebEx app to enable video and telephone visits with your [facility] Provider.   Instructions for downloading [employer] WebEx: - Go to https://www.webex.com/downloads.html and follow the instructions, or download the app on your smartphone ([employer] Webex Meetings). - If you have technical difficulties with downloading WebEx, please call WebEx at [phone]. - Once the app is downloaded (can be done on either mobile or desktop computer), go to Settings in the upper left hand corner.  Be sure that camera and audio are enabled.  - You will receive an email message with a link to the meeting with a time to join for your tele-health visit.  - Please download the app and have settings configured prior to the appointment time.      2-3 DAYS BEFORE YOUR APPOINTMENT  One of our staff will call you to confirm that you have been able to set up your WebEx account. We will remind  you check your blood pressure, heart rate and weight prior to your scheduled appointment. If you have an Apple Watch or K[place]ia, please upload any pertinent ECG strips the day before or morning of your appointment to MyChart. Our staff will also make sure you have reviewed the consent and agree to move forward with your scheduled tele-health visit.    THE DAY OF YOUR APPOINTMENT  Approximately 15-20 minutes prior to your scheduled appointment, you will receive an e-mail directly from one of our staff member's [email or web address] e-mail accounts inviting you to join a WebEx meeting.  Please do not reply to that email - simply join the WebEx meeting.  Upon joining, a member of the office staff will speak with you initially through the WebEx platform to confirm medications, vital signs for the day and any symptoms you may be experiencing.  Please have this information available prior to the time of visit start.      CONSENT FOR TELE-HEALTH VISIT - PLEASE RVIEW  I hereby voluntarily request, consent and authorize [facility] and its employed or contracted physicians, physician assistants, nurse practitioners or other licensed health care professionals (the Practitioner), to provide me with telemedicine health care services (the "Services") as deemed necessary by the treating Practitioner. I acknowledge and consent to receive the Services by the Practitioner via telemedicine. I understand that the telemedicine visit will involve communicating with the Practitioner through live audiovisual communication technology and the disclosure of certain medical information by electronic transmission. I acknowledge that I have been given the opportunity to   request an in-person assessment or other available alternative prior to the telemedicine visit and am voluntarily participating in the telemedicine visit.  I understand that I have the right to withhold or withdraw my consent to the use of telemedicine in the course of  my care at any time, without affecting my right to future care or treatment, and that the Practitioner or I may terminate the telemedicine visit at any time. I understand that I have the right to inspect all information obtained and/or recorded in the course of the telemedicine visit and may receive copies of available information for a reasonable fee.  I understand that some of the potential risks of receiving the Services via telemedicine include:  . Delay or interruption in medical evaluation due to technological equipment failure or disruption; . Information transmitted may not be sufficient (e.g. poor resolution of images) to allow for appropriate medical decision making by the Practitioner; and/or  . In rare instances, security protocols could fail, causing a breach of personal health information.  Furthermore, I acknowledge that it is my responsibility to provide information about my medical history, conditions and care that is complete and accurate to the best of my ability. I acknowledge that Practitioner's advice, recommendations, and/or decision may be based on factors not within their control, such as incomplete or inaccurate data provided by me or distortions of diagnostic images or specimens that may result from electronic transmissions. I understand that the practice of medicine is not an exact science and that Practitioner makes no warranties or guarantees regarding treatment outcomes. I acknowledge that I will receive a copy of this consent concurrently upon execution via email to the email address I last provided but may also request a printed copy by calling the office of [facility].    I understand that my insurance will be billed for this visit.   I have read or had this consent read to me. . I understand the contents of this consent, which adequately explains the benefits and risks of the Services being provided via telemedicine.  . I have been provided ample opportunity to ask  questions regarding this consent and the Services and have had my questions answered to my satisfaction. . I give my informed consent for the services to be provided through the use of telemedicine in my medical care  By participating in this telemedicine visit I agree to the above.  

## 2018-07-10 ENCOUNTER — Ambulatory Visit: Payer: Medicare Other | Admitting: Cardiology

## 2018-07-11 ENCOUNTER — Telehealth: Payer: Self-pay | Admitting: Emergency Medicine

## 2018-07-11 NOTE — Telephone Encounter (Signed)
Left message for patient to return call about canceling Monday 4/6 appointment since we saw her sooner.

## 2018-07-12 ENCOUNTER — Other Ambulatory Visit: Payer: Self-pay

## 2018-07-12 ENCOUNTER — Ambulatory Visit
Admission: RE | Admit: 2018-07-12 | Discharge: 2018-07-12 | Disposition: A | Discharge status: Home / Self Care | Payer: Medicare Other | Source: Ambulatory Visit | Attending: Surgery | Admitting: Surgery

## 2018-07-12 ENCOUNTER — Encounter (HOSPITAL_BASED_OUTPATIENT_CLINIC_OR_DEPARTMENT_OTHER)
Admission: RE | Admit: 2018-07-12 | Discharge: 2018-07-12 | Disposition: A | Discharge status: Home / Self Care | Payer: Medicare Other | Source: Ambulatory Visit | Attending: Surgery | Admitting: Surgery

## 2018-07-12 DIAGNOSIS — Z9049 Acquired absence of other specified parts of digestive tract: Secondary | ICD-10-CM | POA: Diagnosis not present

## 2018-07-12 DIAGNOSIS — E119 Type 2 diabetes mellitus without complications: Secondary | ICD-10-CM | POA: Diagnosis not present

## 2018-07-12 DIAGNOSIS — Z8042 Family history of malignant neoplasm of prostate: Secondary | ICD-10-CM | POA: Diagnosis not present

## 2018-07-12 DIAGNOSIS — I480 Paroxysmal atrial fibrillation: Secondary | ICD-10-CM | POA: Diagnosis not present

## 2018-07-12 DIAGNOSIS — Z170 Estrogen receptor positive status [ER+]: Principal | ICD-10-CM

## 2018-07-12 DIAGNOSIS — Z01812 Encounter for preprocedural laboratory examination: Secondary | ICD-10-CM | POA: Insufficient documentation

## 2018-07-12 DIAGNOSIS — C50411 Malignant neoplasm of upper-outer quadrant of right female breast: Secondary | ICD-10-CM | POA: Diagnosis not present

## 2018-07-12 DIAGNOSIS — E785 Hyperlipidemia, unspecified: Secondary | ICD-10-CM | POA: Diagnosis not present

## 2018-07-12 DIAGNOSIS — E063 Autoimmune thyroiditis: Secondary | ICD-10-CM | POA: Diagnosis not present

## 2018-07-12 DIAGNOSIS — Z806 Family history of leukemia: Secondary | ICD-10-CM | POA: Diagnosis not present

## 2018-07-12 DIAGNOSIS — F17200 Nicotine dependence, unspecified, uncomplicated: Secondary | ICD-10-CM | POA: Diagnosis not present

## 2018-07-12 DIAGNOSIS — C50911 Malignant neoplasm of unspecified site of right female breast: Secondary | ICD-10-CM

## 2018-07-12 DIAGNOSIS — Z811 Family history of alcohol abuse and dependence: Secondary | ICD-10-CM | POA: Diagnosis not present

## 2018-07-12 DIAGNOSIS — Z803 Family history of malignant neoplasm of breast: Secondary | ICD-10-CM | POA: Diagnosis not present

## 2018-07-12 DIAGNOSIS — Z833 Family history of diabetes mellitus: Secondary | ICD-10-CM | POA: Diagnosis not present

## 2018-07-12 DIAGNOSIS — I10 Essential (primary) hypertension: Secondary | ICD-10-CM | POA: Diagnosis not present

## 2018-07-12 DIAGNOSIS — Z90710 Acquired absence of both cervix and uterus: Secondary | ICD-10-CM | POA: Diagnosis not present

## 2018-07-12 LAB — PROTIME-INR
INR: 1.2 (ref 0.8–1.2)
Prothrombin Time: 14.6 seconds (ref 11.4–15.2)

## 2018-07-12 NOTE — Progress Notes (Signed)
Gatorade G2 given to patient with instructions to complete by 1000 DOS.  Surgical scrub also given to patient with instructions for use.  Patient verbalized understanding of instructions.

## 2018-07-13 ENCOUNTER — Encounter (HOSPITAL_BASED_OUTPATIENT_CLINIC_OR_DEPARTMENT_OTHER)
Admission: RE | Disposition: A | Discharge status: Home / Self Care | Payer: Self-pay | Source: Home / Self Care | Attending: Surgery

## 2018-07-13 ENCOUNTER — Ambulatory Visit (HOSPITAL_BASED_OUTPATIENT_CLINIC_OR_DEPARTMENT_OTHER): Payer: Medicare Other | Admitting: Anesthesiology

## 2018-07-13 ENCOUNTER — Ambulatory Visit (HOSPITAL_COMMUNITY)
Admission: RE | Admit: 2018-07-13 | Discharge: 2018-07-13 | Disposition: A | Discharge status: Home / Self Care | Payer: Medicare Other | Source: Ambulatory Visit | Attending: Surgery | Admitting: Surgery

## 2018-07-13 ENCOUNTER — Ambulatory Visit (HOSPITAL_BASED_OUTPATIENT_CLINIC_OR_DEPARTMENT_OTHER)
Admission: RE | Admit: 2018-07-13 | Discharge: 2018-07-13 | Disposition: A | Discharge status: Home / Self Care | Payer: Medicare Other | Attending: Surgery | Admitting: Surgery

## 2018-07-13 ENCOUNTER — Ambulatory Visit
Admission: RE | Admit: 2018-07-13 | Discharge: 2018-07-13 | Disposition: A | Discharge status: Home / Self Care | Payer: Medicare Other | Source: Ambulatory Visit | Attending: Surgery | Admitting: Surgery

## 2018-07-13 ENCOUNTER — Encounter (HOSPITAL_BASED_OUTPATIENT_CLINIC_OR_DEPARTMENT_OTHER): Payer: Self-pay | Admitting: Anesthesiology

## 2018-07-13 DIAGNOSIS — Z170 Estrogen receptor positive status [ER+]: Principal | ICD-10-CM

## 2018-07-13 DIAGNOSIS — Z90710 Acquired absence of both cervix and uterus: Secondary | ICD-10-CM | POA: Insufficient documentation

## 2018-07-13 DIAGNOSIS — C50911 Malignant neoplasm of unspecified site of right female breast: Secondary | ICD-10-CM

## 2018-07-13 DIAGNOSIS — C50411 Malignant neoplasm of upper-outer quadrant of right female breast: Secondary | ICD-10-CM | POA: Insufficient documentation

## 2018-07-13 DIAGNOSIS — Z8042 Family history of malignant neoplasm of prostate: Secondary | ICD-10-CM | POA: Insufficient documentation

## 2018-07-13 DIAGNOSIS — Z803 Family history of malignant neoplasm of breast: Secondary | ICD-10-CM | POA: Insufficient documentation

## 2018-07-13 DIAGNOSIS — Z9049 Acquired absence of other specified parts of digestive tract: Secondary | ICD-10-CM | POA: Insufficient documentation

## 2018-07-13 DIAGNOSIS — I10 Essential (primary) hypertension: Secondary | ICD-10-CM | POA: Diagnosis not present

## 2018-07-13 DIAGNOSIS — Z811 Family history of alcohol abuse and dependence: Secondary | ICD-10-CM | POA: Insufficient documentation

## 2018-07-13 DIAGNOSIS — E063 Autoimmune thyroiditis: Secondary | ICD-10-CM | POA: Insufficient documentation

## 2018-07-13 DIAGNOSIS — I480 Paroxysmal atrial fibrillation: Secondary | ICD-10-CM | POA: Diagnosis not present

## 2018-07-13 DIAGNOSIS — E119 Type 2 diabetes mellitus without complications: Secondary | ICD-10-CM | POA: Insufficient documentation

## 2018-07-13 DIAGNOSIS — F17200 Nicotine dependence, unspecified, uncomplicated: Secondary | ICD-10-CM | POA: Insufficient documentation

## 2018-07-13 DIAGNOSIS — Z806 Family history of leukemia: Secondary | ICD-10-CM | POA: Insufficient documentation

## 2018-07-13 DIAGNOSIS — E785 Hyperlipidemia, unspecified: Secondary | ICD-10-CM | POA: Insufficient documentation

## 2018-07-13 DIAGNOSIS — Z833 Family history of diabetes mellitus: Secondary | ICD-10-CM | POA: Insufficient documentation

## 2018-07-13 HISTORY — PX: BREAST LUMPECTOMY: SHX2

## 2018-07-13 HISTORY — PX: BREAST LUMPECTOMY WITH RADIOACTIVE SEED AND SENTINEL LYMPH NODE BIOPSY: SHX6550

## 2018-07-13 LAB — GLUCOSE, CAPILLARY
Glucose-Capillary: 124 mg/dL — ABNORMAL HIGH (ref 70–99)
Glucose-Capillary: 138 mg/dL — ABNORMAL HIGH (ref 70–99)

## 2018-07-13 SURGERY — BREAST LUMPECTOMY WITH RADIOACTIVE SEED AND SENTINEL LYMPH NODE BIOPSY
Anesthesia: Regional | Site: Breast | Laterality: Right

## 2018-07-13 MED ORDER — PROPOFOL 10 MG/ML IV BOLUS
INTRAVENOUS | Status: DC | PRN
  Administered 2018-07-13: 150 mg via INTRAVENOUS
  Administered 2018-07-13: 50 mg via INTRAVENOUS

## 2018-07-13 MED ORDER — PROPOFOL 10 MG/ML IV BOLUS
INTRAVENOUS | Status: AC
  Filled 2018-07-13: qty 20

## 2018-07-13 MED ORDER — TECHNETIUM TC 99M SULFUR COLLOID FILTERED
1.0000 | Freq: Once | INTRAVENOUS | Status: AC | PRN
  Administered 2018-07-13: 1 via INTRADERMAL

## 2018-07-13 MED ORDER — CHLORHEXIDINE GLUCONATE CLOTH 2 % EX PADS: 6.0000 | MEDICATED_PAD | Freq: Once | CUTANEOUS | Status: DC

## 2018-07-13 MED ORDER — DEXTROSE 5 % IV SOLN: 3.0000 g | INTRAVENOUS | Status: DC

## 2018-07-13 MED ORDER — ONDANSETRON HCL 4 MG/2ML IJ SOLN
INTRAMUSCULAR | Status: DC | PRN
  Administered 2018-07-13: 4 mg via INTRAVENOUS

## 2018-07-13 MED ORDER — MIDAZOLAM HCL 2 MG/2ML IJ SOLN
1.0000 mg | INTRAMUSCULAR | Status: DC | PRN
  Administered 2018-07-13: 13:00:00 2 mg via INTRAVENOUS

## 2018-07-13 MED ORDER — CEFAZOLIN SODIUM-DEXTROSE 2-4 GM/100ML-% IV SOLN
INTRAVENOUS | Status: AC
  Filled 2018-07-13: qty 100

## 2018-07-13 MED ORDER — FENTANYL CITRATE (PF) 100 MCG/2ML IJ SOLN
INTRAMUSCULAR | Status: AC
  Filled 2018-07-13: qty 2

## 2018-07-13 MED ORDER — ACETAMINOPHEN 500 MG PO TABS
1000.0000 mg | ORAL_TABLET | ORAL | Status: AC
  Administered 2018-07-13: 13:00:00 1000 mg via ORAL

## 2018-07-13 MED ORDER — EPHEDRINE 5 MG/ML INJ
INTRAVENOUS | Status: AC
  Filled 2018-07-13: qty 10

## 2018-07-13 MED ORDER — GABAPENTIN 300 MG PO CAPS
300.0000 mg | ORAL_CAPSULE | ORAL | Status: AC
  Administered 2018-07-13: 300 mg via ORAL

## 2018-07-13 MED ORDER — LIDOCAINE 2% (20 MG/ML) 5 ML SYRINGE
INTRAMUSCULAR | Status: DC | PRN
  Administered 2018-07-13: 60 mg via INTRAVENOUS

## 2018-07-13 MED ORDER — GABAPENTIN 300 MG PO CAPS
ORAL_CAPSULE | ORAL | Status: AC
  Filled 2018-07-13: qty 1

## 2018-07-13 MED ORDER — DEXAMETHASONE SODIUM PHOSPHATE 10 MG/ML IJ SOLN
INTRAMUSCULAR | Status: AC
  Filled 2018-07-13: qty 1

## 2018-07-13 MED ORDER — DEXAMETHASONE SODIUM PHOSPHATE 4 MG/ML IJ SOLN
INTRAMUSCULAR | Status: DC | PRN
  Administered 2018-07-13: 10 mg via INTRAVENOUS

## 2018-07-13 MED ORDER — ROPIVACAINE HCL 5 MG/ML IJ SOLN
INTRAMUSCULAR | Status: DC | PRN
  Administered 2018-07-13: 30 mL via PERINEURAL

## 2018-07-13 MED ORDER — FENTANYL CITRATE (PF) 100 MCG/2ML IJ SOLN
INTRAMUSCULAR | Status: DC | PRN
  Administered 2018-07-13: 25 ug via INTRAVENOUS
  Administered 2018-07-13: 50 ug via INTRAVENOUS
  Administered 2018-07-13: 25 ug via INTRAVENOUS

## 2018-07-13 MED ORDER — HYDROCODONE-ACETAMINOPHEN 5-325 MG PO TABS: 1.0000 | ORAL_TABLET | Freq: Four times a day (QID) | ORAL | 0 refills | Status: DC | PRN

## 2018-07-13 MED ORDER — ONDANSETRON HCL 4 MG/2ML IJ SOLN
INTRAMUSCULAR | Status: AC
  Filled 2018-07-13: qty 2

## 2018-07-13 MED ORDER — CEFAZOLIN SODIUM-DEXTROSE 2-4 GM/100ML-% IV SOLN
2.0000 g | INTRAVENOUS | Status: AC
  Administered 2018-07-13: 2 g via INTRAVENOUS

## 2018-07-13 MED ORDER — SCOPOLAMINE 1 MG/3DAYS TD PT72: 1.0000 | MEDICATED_PATCH | Freq: Once | TRANSDERMAL | Status: DC | PRN

## 2018-07-13 MED ORDER — MIDAZOLAM HCL 2 MG/2ML IJ SOLN
INTRAMUSCULAR | Status: AC
  Filled 2018-07-13: qty 2

## 2018-07-13 MED ORDER — CELECOXIB 200 MG PO CAPS
ORAL_CAPSULE | ORAL | Status: AC
  Filled 2018-07-13: qty 2

## 2018-07-13 MED ORDER — FENTANYL CITRATE (PF) 100 MCG/2ML IJ SOLN: 25.0000 ug | INTRAMUSCULAR | Status: DC | PRN

## 2018-07-13 MED ORDER — CELECOXIB 400 MG PO CAPS
400.0000 mg | ORAL_CAPSULE | ORAL | Status: AC
  Administered 2018-07-13: 400 mg via ORAL

## 2018-07-13 MED ORDER — ACETAMINOPHEN 500 MG PO TABS
ORAL_TABLET | ORAL | Status: AC
  Filled 2018-07-13: qty 2

## 2018-07-13 MED ORDER — LIDOCAINE 2% (20 MG/ML) 5 ML SYRINGE
INTRAMUSCULAR | Status: AC
  Filled 2018-07-13: qty 5

## 2018-07-13 MED ORDER — FENTANYL CITRATE (PF) 100 MCG/2ML IJ SOLN
50.0000 ug | INTRAMUSCULAR | Status: DC | PRN
  Administered 2018-07-13 (×2): 50 ug via INTRAVENOUS

## 2018-07-13 MED ORDER — BUPIVACAINE-EPINEPHRINE (PF) 0.25% -1:200000 IJ SOLN
INTRAMUSCULAR | Status: DC | PRN
  Administered 2018-07-13: 24 mL

## 2018-07-13 MED ORDER — EPHEDRINE SULFATE 50 MG/ML IJ SOLN
INTRAMUSCULAR | Status: DC | PRN
  Administered 2018-07-13 (×2): 10 mg via INTRAVENOUS

## 2018-07-13 MED ORDER — CLONIDINE HCL (ANALGESIA) 100 MCG/ML EP SOLN
EPIDURAL | Status: DC | PRN
  Administered 2018-07-13: 100 ug

## 2018-07-13 MED ORDER — LACTATED RINGERS IV SOLN
INTRAVENOUS | Status: DC
  Administered 2018-07-13 (×2): via INTRAVENOUS

## 2018-07-13 MED ORDER — IBUPROFEN 800 MG PO TABS: 800.0000 mg | ORAL_TABLET | Freq: Three times a day (TID) | ORAL | 0 refills | Status: DC | PRN

## 2018-07-13 SURGICAL SUPPLY — 51 items
ADH SKN CLS APL DERMABOND .7 (GAUZE/BANDAGES/DRESSINGS) ×1
APL PRP STRL LF DISP 70% ISPRP (MISCELLANEOUS) ×3
APPLIER CLIP 9.375 MED OPEN (MISCELLANEOUS) ×3
APR CLP MED 9.3 20 MLT OPN (MISCELLANEOUS) ×1
BINDER BREAST LRG (GAUZE/BANDAGES/DRESSINGS) IMPLANT
BINDER BREAST MEDIUM (GAUZE/BANDAGES/DRESSINGS) IMPLANT
BINDER BREAST XLRG (GAUZE/BANDAGES/DRESSINGS) ×2 IMPLANT
BINDER BREAST XXLRG (GAUZE/BANDAGES/DRESSINGS) IMPLANT
BLADE SURG 15 STRL LF DISP TIS (BLADE) ×1 IMPLANT
BLADE SURG 15 STRL SS (BLADE) ×3
CANISTER SUC SOCK COL 7IN (MISCELLANEOUS) IMPLANT
CANISTER SUCT 1200ML W/VALVE (MISCELLANEOUS) ×3 IMPLANT
CHLORAPREP W/TINT 26 (MISCELLANEOUS) ×7 IMPLANT
CLIP APPLIE 9.375 MED OPEN (MISCELLANEOUS) ×1 IMPLANT
COVER BACK TABLE REUSABLE LG (DRAPES) ×3 IMPLANT
COVER MAYO STAND REUSABLE (DRAPES) ×3 IMPLANT
COVER PROBE W GEL 5X96 (DRAPES) ×3 IMPLANT
COVER WAND RF STERILE (DRAPES) IMPLANT
DECANTER SPIKE VIAL GLASS SM (MISCELLANEOUS) IMPLANT
DERMABOND ADVANCED (GAUZE/BANDAGES/DRESSINGS) ×2
DERMABOND ADVANCED .7 DNX12 (GAUZE/BANDAGES/DRESSINGS) ×1 IMPLANT
DRAPE LAPAROSCOPIC ABDOMINAL (DRAPES) ×3 IMPLANT
DRAPE UTILITY XL STRL (DRAPES) ×3 IMPLANT
ELECT COATED BLADE 2.86 ST (ELECTRODE) ×3 IMPLANT
ELECT REM PT RETURN 9FT ADLT (ELECTROSURGICAL) ×3
ELECTRODE REM PT RTRN 9FT ADLT (ELECTROSURGICAL) ×1 IMPLANT
GLOVE BIOGEL PI IND STRL 8 (GLOVE) ×1 IMPLANT
GLOVE BIOGEL PI INDICATOR 8 (GLOVE) ×2
GLOVE ECLIPSE 8.0 STRL XLNG CF (GLOVE) ×3 IMPLANT
GOWN STRL REUS W/ TWL LRG LVL3 (GOWN DISPOSABLE) ×2 IMPLANT
GOWN STRL REUS W/TWL LRG LVL3 (GOWN DISPOSABLE) ×6
HEMOSTAT ARISTA ABSORB 3G PWDR (HEMOSTASIS) IMPLANT
HEMOSTAT SNOW SURGICEL 2X4 (HEMOSTASIS) IMPLANT
KIT MARKER MARGIN INK (KITS) ×3 IMPLANT
NDL HYPO 25X1 1.5 SAFETY (NEEDLE) ×1 IMPLANT
NDL SAFETY ECLIPSE 18X1.5 (NEEDLE) IMPLANT
NEEDLE HYPO 18GX1.5 SHARP (NEEDLE)
NEEDLE HYPO 25X1 1.5 SAFETY (NEEDLE) ×3 IMPLANT
NS IRRIG 1000ML POUR BTL (IV SOLUTION) ×3 IMPLANT
PACK BASIN DAY SURGERY FS (CUSTOM PROCEDURE TRAY) ×3 IMPLANT
PENCIL BUTTON HOLSTER BLD 10FT (ELECTRODE) ×3 IMPLANT
SLEEVE SCD COMPRESS KNEE MED (MISCELLANEOUS) ×3 IMPLANT
SPONGE LAP 4X18 RFD (DISPOSABLE) ×3 IMPLANT
SUT MNCRL AB 4-0 PS2 18 (SUTURE) ×3 IMPLANT
SUT VICRYL 3-0 CR8 SH (SUTURE) ×3 IMPLANT
SYR CONTROL 10ML LL (SYRINGE) ×3 IMPLANT
TOWEL GREEN STERILE FF (TOWEL DISPOSABLE) ×3 IMPLANT
TRAY FAXITRON CT DISP (TRAY / TRAY PROCEDURE) ×3 IMPLANT
TUBE CONNECTING 20'X1/4 (TUBING) ×1
TUBE CONNECTING 20X1/4 (TUBING) ×2 IMPLANT
YANKAUER SUCT BULB TIP NO VENT (SUCTIONS) ×3 IMPLANT

## 2018-07-13 NOTE — Anesthesia Preprocedure Evaluation (Addendum)
Anesthesia Evaluation  Patient identified by MRN, date of birth, ID band Patient awake    Reviewed: Allergy & Precautions, NPO status , Patient's Chart, lab work & pertinent test results, reviewed documented beta blocker date and time   Airway Mallampati: I  TM Distance: >3 FB Neck ROM: Full    Dental no notable dental hx. (+) Edentulous Upper, Edentulous Lower   Pulmonary neg pulmonary ROS, Current Smoker,    Pulmonary exam normal breath sounds clear to auscultation       Cardiovascular hypertension, Pt. on home beta blockers and Pt. on medications Normal cardiovascular exam+ dysrhythmias (on coumadin, digoxin, flecainide) Atrial Fibrillation  Rhythm:Regular Rate:Normal  TTE 02/2018 EF 55-60%, no valvular abnormalities   Neuro/Psych PSYCHIATRIC DISORDERS Depression negative neurological ROS     GI/Hepatic negative GI ROS, Neg liver ROS,   Endo/Other  diabetes, Type 2, Oral Hypoglycemic AgentsHypothyroidism   Renal/GU negative Renal ROS  negative genitourinary   Musculoskeletal negative musculoskeletal ROS (+)   Abdominal   Peds  Hematology negative hematology ROS (+)   Anesthesia Other Findings Right breast cancer  Reproductive/Obstetrics negative OB ROS                            Anesthesia Physical Anesthesia Plan  ASA: III  Anesthesia Plan: General and Regional   Post-op Pain Management:  Regional for Post-op pain   Induction: Intravenous  PONV Risk Score and Plan: 2 and Ondansetron, Dexamethasone and Midazolam  Airway Management Planned: LMA  Additional Equipment:   Intra-op Plan:   Post-operative Plan: Extubation in OR  Informed Consent: I have reviewed the patients History and Physical, chart, labs and discussed the procedure including the risks, benefits and alternatives for the proposed anesthesia with the patient or authorized representative who has indicated his/her  understanding and acceptance.     Dental advisory given  Plan Discussed with: CRNA  Anesthesia Plan Comments:         Anesthesia Quick Evaluation

## 2018-07-13 NOTE — Transfer of Care (Addendum)
Immediate Anesthesia Transfer of Care Note  Patient: Sabrina [name]  Procedure(s) Performed: Procedure(s) (LRB): RIGHT BREAST LUMPECTOMY WITH RADIOACTIVE SEED AND RIGHT SENTINEL LYMPH NODE MAPPING (Right)  Patient Location: PACU  Anesthesia Type: General  Level of Consciousness: awake, sedated, patient cooperative and responds to stimulation  Airway & Oxygen Therapy: Patient Spontanous Breathing and Patient connected to face mask oxygen  Post-op Assessment: Report given to PACU RN, Post -op Vital signs reviewed and stable and Patient moving all extremities  Post vital signs: Reviewed and stable  Complications: No apparent anesthesia complications

## 2018-07-13 NOTE — Progress Notes (Signed)
Assisted Dr. Woodrum with right, ultrasound guided, pectoralis block. Side rails up, monitors on throughout procedure. See vital signs in flow sheet. Tolerated Procedure well. 

## 2018-07-13 NOTE — Discharge Instructions (Addendum)
°Post Anesthesia Home Care Instructions ° °Activity: °Get plenty of rest for the remainder of the day. A responsible individual must stay with you for 24 hours following the procedure.  °For the next 24 hours, DO NOT: °-Drive a car °-Operate machinery °-Drink alcoholic beverages °-Take any medication unless instructed by your physician °-Make any legal decisions or sign important papers. ° °Meals: °Start with liquid foods such as gelatin or soup. Progress to regular foods as tolerated. Avoid greasy, spicy, heavy foods. If nausea and/or vomiting occur, drink only clear liquids until the nausea and/or vomiting subsides. Call your physician if vomiting continues. ° °Special Instructions/Symptoms: °Your throat may feel dry or sore from the anesthesia or the breathing tube placed in your throat during surgery. If this causes discomfort, gargle with warm salt water. The discomfort should disappear within 24 hours. ° °If you had a scopolamine patch placed behind your ear for the management of post- operative nausea and/or vomiting: ° °1. The medication in the patch is effective for 72 hours, after which it should be removed.  Wrap patch in a tissue and discard in the trash. Wash hands thoroughly with soap and water. °2. You may remove the patch earlier than 72 hours if you experience unpleasant side effects which may include dry mouth, dizziness or visual disturbances. °3. Avoid touching the patch. Wash your hands with soap and water after contact with the patch. °   °[facility] °Office Phone Number [phone] ° °BREAST BIOPSY/ PARTIAL MASTECTOMY: POST OP INSTRUCTIONS ° °Always review your discharge instruction sheet given to you by the facility where your surgery was performed. ° °IF YOU HAVE DISABILITY OR FAMILY LEAVE FORMS, YOU MUST BRING THEM TO THE OFFICE FOR PROCESSING.  DO NOT GIVE THEM TO YOUR DOCTOR. ° °1. A prescription for pain medication may be given to you upon discharge.  Take your pain  medication as prescribed, if needed.  If narcotic pain medicine is not needed, then you may take acetaminophen (Tylenol) or ibuprofen (Advil) as needed. °2. Take your usually prescribed medications unless otherwise directed °3. If you need a refill on your pain medication, please contact your pharmacy.  They will contact our office to request authorization.  Prescriptions will not be filled after 5pm or on week-ends. °4. You should eat very light the first 24 hours after surgery, such as soup, crackers, pudding, etc.  Resume your normal diet the day after surgery. °5. Most patients will experience some swelling and bruising in the breast.  Ice packs and a good support bra will help.  Swelling and bruising can take several days to resolve.  °6. It is common to experience some constipation if taking pain medication after surgery.  Increasing fluid intake and taking a stool softener will usually help or prevent this problem from occurring.  A mild laxative (Milk of Magnesia or Miralax) should be taken according to package directions if there are no bowel movements after 48 hours. °7. Unless discharge instructions indicate otherwise, you may remove your bandages 24-48 hours after surgery, and you may shower at that time.  You may have steri-strips (small skin tapes) in place directly over the incision.  These strips should be left on the skin for 7-10 days.  If your surgeon used skin glue on the incision, you may shower in 24 hours.  The glue will flake off over the next 2-3 weeks.  Any sutures or staples will be removed at the office during your follow-up visit. °8. ACTIVITIES:    You may resume regular daily activities (gradually increasing) beginning the next day.  Wearing a good support bra or sports bra minimizes pain and swelling.  You may have sexual intercourse when it is comfortable. a. You may drive when you no longer are taking prescription pain medication, you can comfortably wear a seatbelt, and you can  safely maneuver your car and apply brakes. b. RETURN TO WORK:  ______________________________________________________________________________________ 9. You should see your doctor in the office for a follow-up appointment approximately two weeks after your surgery.  Your doctors nurse will typically make your follow-up appointment when she calls you with your pathology report.  Expect your pathology report 2-3 business days after your surgery.  You may call to check if you do not hear from [place] after three days. 10. OTHER INSTRUCTIONS: _______________________________________________________________________________________________ _____________________________________________________________________________________________________________________________________ _____________________________________________________________________________________________________________________________________ _____________________________________________________________________________________________________________________________________  WHEN TO CALL YOUR DOCTOR: 1. Fever over 101.0 2. Nausea and/or vomiting. 3. Extreme swelling or bruising. 4. Continued bleeding from incision. 5. Increased pain, redness, or drainage from the incision.  The clinic staff is available to answer your questions during regular business hours.  Please dont hesitate to call and ask to speak to one of the nurses for clinical concerns.  If you have a medical emergency, go to the nearest emergency room or call 911.  A surgeon from [facility] is always on call at the hospital.  For further questions, please visit [email or web address]  Restart coumadin in 24 hours

## 2018-07-13 NOTE — Op Note (Signed)
Preoperative diagnosis: Stage I right breast cancer upper outer quadrant  Postop diagnosis: Same  Procedure: Right breast seed localized lumpectomy with right axillary sentinel lymph node mapping of deep right axillary sentinel node  Surgeon: Erroll Luna, MD  Anesthesia: LMA with local plus pectoral block per anesthesia  EBL: 10 cc  Specimen: Right breast tissue with seed and clip verified by Faxitron plus additional anterior margin +3 sentinel lymph nodes hot  Drains: None  IV fluids: Per anesthesia record  Indications for procedure: Patient presents for right breast seed lumpectomy for stage I right breast cancer.  Risk, benefits and other options of treatment and other surgical options discussed with the patient.The procedure has been discussed with the patient. Alternatives to surgery have been discussed with the patient.  Risks of surgery include bleeding,  Infection,  Seroma formation, death,  and the need for further surgery.   The patient understands and wishes to proceed.Sentinel lymph node mapping and dissection has been discussed with the patient.  Risk of bleeding,  Infection,  Seroma formation,  Additional procedures,,  Shoulder weakness ,  Shoulder stiffness,  Nerve and blood vessel injury and reaction to the mapping dyes have been discussed.  Alternatives to surgery have been discussed with the patient.  The patient agrees to proceed.  Description of procedure: The was seen in the holding area.  Pectoral block placed per anesthesia.  Right breast was marked as correct side and neoprobe used to verify seed location.  She underwent injection of technetium sulfur colloid by the radiology technician.  She was then taken back the operative room.  She is placed supine upon the OR table.  After induction of general anesthesia, the right breast was prepped and draped in sterile fashion timeout was done.  Proper patient, side and procedure were verified.  Neoprobe was used to identify  the seed in the right breast upper outer quadrant.  Curvilinear incision was made along the lateral border of the nipple areolar complex.  Dissection was carried down and all tissue around the seed and clip were excised with grossly negative margins.  Faxitron image revealed both seed and clip in specimen.  The anterior margin appeared close therefore took an additional anterior margin sent separately.  Hemostasis was achieved with cautery.  Clips were placed to mark the cavity and the cavity closed with 3-0 Vicryl and 4-0 Monocryl.  The neoprobe settings were changed to technetium.  Incision made in the right axilla measuring 3 cm.  Dissection was carried through the subcutaneous fat into the level 1 axillary nodes.  3 nodes were identified and removed was hot.  Background counts approached 0.  The long thoracic nerve, thoracodorsal trunk and axillary vein were all preserved.  Irrigation used and hemostasis achieved with cautery.  Wound closed with 3-0 Vicryl and 4-0 Monocryl.  Dermabond applied.  All final counts of sponge, needle and instruments found to be correct at this portion of the case.  The patient was awoke extubated taken to recovery in satisfactory condition.

## 2018-07-13 NOTE — Progress Notes (Signed)
Emotional support during breast injections °

## 2018-07-13 NOTE — Anesthesia Procedure Notes (Signed)
Procedure Name: LMA Insertion Date/Time: 07/13/2018 1:52 PM Performed by: Justice Rocher, CRNA Pre-anesthesia Checklist: Patient identified, Emergency Drugs available, Suction available and Patient being monitored Patient Re-evaluated:Patient Re-evaluated prior to induction Oxygen Delivery Method: Circle system utilized Preoxygenation: Pre-oxygenation with 100% oxygen Induction Type: IV induction Ventilation: Mask ventilation without difficulty LMA: LMA inserted LMA Size: 4.0 Number of attempts: 1 Airway Equipment and Method: Bite block Placement Confirmation: positive ETCO2 and breath sounds checked- equal and bilateral Tube secured with: Tape Dental Injury: Teeth and Oropharynx as per pre-operative assessment

## 2018-07-13 NOTE — Anesthesia Postprocedure Evaluation (Signed)
Anesthesia Post Note  Patient: Sabrina [name]  Procedure(s) Performed: RIGHT BREAST LUMPECTOMY WITH RADIOACTIVE SEED AND RIGHT SENTINEL LYMPH NODE MAPPING (Right Breast)     Patient location during evaluation: PACU Anesthesia Type: Regional and General Level of consciousness: awake and alert Pain management: pain level controlled Vital Signs Assessment: post-procedure vital signs reviewed and stable Respiratory status: spontaneous breathing, nonlabored ventilation, respiratory function stable and patient connected to nasal cannula oxygen Cardiovascular status: blood pressure returned to baseline and stable Postop Assessment: no apparent nausea or vomiting Anesthetic complications: no    Last Vitals:  Vitals:   07/13/18 1530 07/13/18 1547  BP: (!) 164/76 (!) 166/68  Pulse: (!) 59 76  Resp: 14 16  Temp:  36.6 C  SpO2: 90% 98%    Last Pain:  Vitals:   07/13/18 1547  TempSrc:   PainSc: 0-No pain                 Lorelei Heikkila L Ananiah Maciolek

## 2018-07-13 NOTE — Anesthesia Procedure Notes (Signed)
Anesthesia Regional Block: Pectoralis block   Pre-Anesthetic Checklist: ,, timeout performed, Correct Patient, Correct Site, Correct Laterality, Correct Procedure, Correct Position, site marked, Risks and benefits discussed,  Surgical consent,  Pre-op evaluation,  At surgeon's request and post-op pain management  Laterality: Right  Prep: Maximum Sterile Barrier Precautions used, chloraprep       Needles:  Injection technique: Single-shot  Needle Type: Echogenic Stimulator Needle     Needle Length: 9cm  Needle Gauge: 22     Additional Needles:   Procedures:,,,, ultrasound used (permanent image in chart),,,,  Narrative:  Start time: 07/13/2018 1:16 PM End time: 07/13/2018 1:26 PM Injection made incrementally with aspirations every 5 mL.  Performed by: Personally  Anesthesiologist: Freddrick March, MD  Additional Notes: Monitors applied. No increased pain on injection. No increased resistance to injection. Injection made in 5cc increments. Good needle visualization. Patient tolerated procedure well.

## 2018-07-13 NOTE — Interval H&P Note (Signed)
History and Physical Interval Note:  07/13/2018 1:28 PM  Sabrina [name]  has presented today for surgery, with the diagnosis of RIGHT BREAST CANCER.  The various methods of treatment have been discussed with the patient and family. After consideration of risks, benefits and other options for treatment, the patient has consented to  Procedure(s): RIGHT BREAST LUMPECTOMY WITH RADIOACTIVE SEED AND RIGHT SENTINEL LYMPH NODE MAPPING (Right) as a surgical intervention.  The patient's history has been reviewed, patient examined, no change in status, stable for surgery.  I have reviewed the patient's chart and labs.  Questions were answered to the patient's satisfaction.     [place]

## 2018-07-14 ENCOUNTER — Encounter (HOSPITAL_BASED_OUTPATIENT_CLINIC_OR_DEPARTMENT_OTHER): Payer: Self-pay | Admitting: Surgery

## 2018-07-17 ENCOUNTER — Ambulatory Visit: Payer: Medicare Other | Admitting: Cardiology

## 2018-07-17 ENCOUNTER — Encounter: Payer: Self-pay | Admitting: General Practice

## 2018-07-17 NOTE — Addendum Note (Signed)
Addendum  created 07/17/18 1355 by Freddrick March, MD   Intraprocedure Staff edited

## 2018-07-17 NOTE — Progress Notes (Signed)
[facility] Psychosocial Distress Screening Spiritual Care   Late entry for [facility] distress screen due to COVID-19 precautionary procedures. Ms Ancona has already received f/u from Support Team.  [place] 07/17/2018  Screening Type Initial Screening  Distress experienced in past week (1-10) 3  Emotional problem type Nervousness/Anxiety  Referral to support programs Yes    [place], [place], [facility] Pager [phone] Voicemail [phone]

## 2018-07-19 ENCOUNTER — Telehealth: Payer: Self-pay | Admitting: Hematology and Oncology

## 2018-07-19 NOTE — Progress Notes (Signed)
HEMATOLOGY-ONCOLOGY [place] VISIT PROGRESS NOTE  I connected with Sabrina [name] on 07/20/2018 at 11:45 AM EDT by Webex video conference and verified that I am speaking with the correct person using two identifiers.  I discussed the limitations, risks, security and privacy concerns of performing an evaluation and management service by Webex and the availability of in person appointments.  I also discussed with the patient that there may be a patient responsible charge related to this service. The patient expressed understanding and agreed to proceed.  Patient's Location: Home Physician Location: Clinic  CHIEF COMPLIANT: Follow-up s/p lumpectomy to review pathology  INTERVAL HISTORY: Sabrina [name] is a 68 y.o. female with above-mentioned history of right breast cancer. Recent genetic testing was negative. 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  She is healing very well from recent surgery.    Malignant neoplasm of upper-outer quadrant of right breast in female, estrogen receptor positive ([place])   06/19/2018 Initial Diagnosis    Screening mammogram detected right breast mass at 9 o'clock position 3 cm from the nipple, ultrasound revealed 7 x 5 x 5 mm mass, axillary ultrasound normal-appearing lymph nodes, ultrasound biopsy: Grade 1 IDC ER 100%, PR 10%, Ki-67 10%, HER-2 3+ positive, T1b N0 stage Ia    06/28/2018 Cancer Staging    Staging form: Breast, AJCC 8th Edition - Clinical stage from 06/28/2018: Stage IA (cT1b, cN0, cM0, G1, ER+, PR+, HER2+) - Signed by Nicholas Lose, MD on 06/28/2018    07/05/2018 Genetic Testing    Negative genetic testing on the common hereditary cancer panel.  The Hereditary Gene Panel offered by [facility] includes sequencing and/or deletion duplication testing of the following 48 genes: APC, ATM, AXIN2, BARD1, BMPR1A, BRCA1, BRCA2, BRIP1, CDH1, CDK4, CDKN2A (p14ARF), CDKN2A  (p16INK4a), CHEK2, CTNNA1, DICER1, EPCAM (Deletion/duplication testing only), GREM1 (promoter region deletion/duplication testing only), KIT, MEN1, MLH1, MSH2, MSH3, MSH6, MUTYH, NBN, NF1, NHTL1, PALB2, PDGFRA, PMS2, POLD1, POLE, PTEN, RAD50, RAD51C, RAD51D, RNF43, SDHB, SDHC, SDHD, SMAD4, SMARCA4. STK11, TP53, TSC1, TSC2, and VHL.  The following genes were evaluated for sequence changes only: SDHA and HOXB13 c.251G>A variant only. The report date is July 05, 2018.    07/13/2018 Surgery    Right lumpectomy ([facility]): IDC with DCIS, 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      REVIEW OF SYSTEMS:   Constitutional: Denies fevers, chills or abnormal weight loss Eyes: Denies blurriness of vision Ears, nose, mouth, throat, and face: Denies mucositis or sore throat Respiratory: Denies cough, dyspnea or wheezes Cardiovascular: Denies palpitation, chest discomfort Gastrointestinal:  Denies nausea, heartburn or change in bowel habits Skin: Denies abnormal skin rashes Lymphatics: Denies new lymphadenopathy or easy bruising Neurological:Denies numbness, tingling or new weaknesses Behavioral/Psych: Mood is stable, no new changes  Extremities: No lower extremity edema Breast: Recent breast surgery with lumpectomy healing very well. All other systems were reviewed with the patient and are negative.  Observations/Objective:  There were no vitals filed for this visit. There is no height or weight on file to calculate BMI.  I have reviewed the data as listed CMP Latest Ref Rng & Units 06/28/2018  Glucose 70 - 99 mg/dL 105(H)  BUN 8 - 23 mg/dL 11  Creatinine 0.44 - 1.00 mg/dL 0.65  Sodium 135 - 145 mmol/L 142  Potassium 3.5 - 5.1 mmol/L 4.3  Chloride 98 - 111 mmol/L  105  CO2 22 - 32 mmol/L 29  Calcium 8.9 - 10.3 mg/dL 8.7(L)  Total Protein 6.5 - 8.1 g/dL 6.6  Total Bilirubin 0.3 - 1.2 mg/dL 0.4  Alkaline Phos 38 - 126 U/L 76  AST 15 - 41 U/L 13(L)    ALT 0 - 44 U/L 11    Lab Results  Component Value Date   WBC 6.7 06/28/2018   HGB 12.6 06/28/2018   HCT 40.7 06/28/2018   MCV 93.8 06/28/2018   PLT 159 06/28/2018   NEUTROABS 4.0 06/28/2018    Assessment Plan:  Malignant neoplasm of upper-outer quadrant of right breast in female, estrogen receptor positive ([place]) 07/13/2018:Right lumpectomy ([facility]): IDC with DCIS, 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   Pathology counseling: I discussed the final pathology report of the patient provided  a copy of this report. I discussed the margins as well as lymph node surgeries. We also discussed the final staging along with previously performed ER/PR and HER-2/neu testing.  Treatment plan: 1.  Adj chemo with Taxol-Herceptin weekly X 12 and then q 3 weeks Herceptin. 2.  Adjuvant radiation therapy 3.  Followed by adjuvant antiestrogen therapy  She had an echocardiogram in November 2019.  EF was normal.  We will plan to do echocardiograms every 3 months from now.  I do not think we need to repeat another echocardiogram at this point.  We will use her previous echo for starting her treatment. I will request port placement  Return to clinic 08/17/2018 to start first cycle of chemo   I discussed the assessment and treatment plan with the patient. The patient was provided an opportunity to ask questions and all were answered. The patient agreed with the plan and demonstrated an understanding of the instructions. The patient was advised to call back or seek an in-person evaluation if the symptoms worsen or if the condition fails to improve as anticipated.   I provided 20 minutes of face-to-face Web Ex time during this encounter.    Rulon Eisenmenger, MD 07/20/2018    I, Molly Dorshimer, am acting as scribe for Nicholas Lose, MD.  I have reviewed the above documentation for accuracy and completeness, and I agree with the above.

## 2018-07-19 NOTE — Telephone Encounter (Signed)
Spoke with patient and instructed her to download the [employer] webex app. Emailed her the join link and verified her email address.

## 2018-07-19 NOTE — Telephone Encounter (Signed)
Scheduled appt per 4/8 sch message - unable to reach patient - left message with appt date and time

## 2018-07-20 ENCOUNTER — Inpatient Hospital Stay: Payer: Medicare Other | Attending: Hematology and Oncology | Admitting: Hematology and Oncology

## 2018-07-20 DIAGNOSIS — Z170 Estrogen receptor positive status [ER+]: Secondary | ICD-10-CM | POA: Diagnosis not present

## 2018-07-20 DIAGNOSIS — C50411 Malignant neoplasm of upper-outer quadrant of right female breast: Secondary | ICD-10-CM | POA: Diagnosis not present

## 2018-07-20 MED ORDER — PROCHLORPERAZINE MALEATE 10 MG PO TABS: 10.0000 mg | ORAL_TABLET | Freq: Four times a day (QID) | ORAL | 1 refills | Status: DC | PRN

## 2018-07-20 MED ORDER — LIDOCAINE-PRILOCAINE 2.5-2.5 % EX CREA: TOPICAL_CREAM | CUTANEOUS | 3 refills | Status: DC

## 2018-07-20 MED ORDER — ONDANSETRON HCL 8 MG PO TABS: 8.0000 mg | ORAL_TABLET | Freq: Two times a day (BID) | ORAL | 1 refills | Status: DC | PRN

## 2018-07-20 NOTE — Progress Notes (Signed)
START ON PATHWAY REGIMEN - Breast   Paclitaxel Weekly + Trastuzumab Weekly:   Administer weekly:     Paclitaxel      Trastuzumab-xxxx      Trastuzumab-xxxx   **Always confirm dose/schedule in your pharmacy ordering system**  Trastuzumab (Maintenance - NO Loading Dose):   A cycle is every 21 days:     Trastuzumab-xxxx   **Always confirm dose/schedule in your pharmacy ordering system**  Patient Characteristics: Postoperative without Neoadjuvant Therapy (Pathologic Staging), Invasive Disease, Adjuvant Therapy, HER2 Positive, ER Positive, Node Negative, pT1c, pN0/N1mi Therapeutic Status: Postoperative without Neoadjuvant Therapy (Pathologic Staging) AJCC Grade: G2 AJCC N Category: pN0 AJCC M Category: cM0 ER Status: Positive (+) AJCC 8 Stage Grouping: IA HER2 Status: Positive (+) Oncotype Dx Recurrence Score: Not Appropriate AJCC T Category: pT1c PR Status: Positive (+) Intent of Therapy: Curative Intent, Discussed with Patient 

## 2018-07-20 NOTE — Assessment & Plan Note (Signed)
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   Pathology counseling: I discussed the final pathology report of the patient provided  a copy of this report. I discussed the margins as well as lymph node surgeries. We also discussed the final staging along with previously performed ER/PR and HER-2/neu testing.  Treatment plan: 1.  Oncotype DX testing to determine if she would benefit from chemotherapy. 2.  Adjuvant radiation therapy 3.  Followed by adjuvant antiestrogen therapy  Return to clinic based upon Oncotype DX test result.

## 2018-07-21 ENCOUNTER — Encounter: Payer: Self-pay | Admitting: *Deleted

## 2018-07-21 ENCOUNTER — Telehealth: Payer: Self-pay | Admitting: Hematology and Oncology

## 2018-07-21 NOTE — Telephone Encounter (Signed)
Called regarding 4/30

## 2018-07-24 ENCOUNTER — Ambulatory Visit: Payer: Self-pay | Admitting: Surgery

## 2018-07-24 ENCOUNTER — Telehealth: Payer: Self-pay | Admitting: Cardiology

## 2018-07-24 NOTE — Telephone Encounter (Signed)
Up to surgeon, some will do port without stopping. If they needs to stop - for 5 days

## 2018-07-24 NOTE — Telephone Encounter (Signed)
Patient informed that it is essentially up to the surgeon. Patient informed that if the surgeon prefers her hold coumadin it would be for 5 days before the procedure. Patient verbally understands. No further questions.

## 2018-07-24 NOTE — Telephone Encounter (Signed)
Patient has breast cancer surgery one week ago and now is having a port put in, how long should she be off her coumadin? Please advise.

## 2018-07-27 ENCOUNTER — Encounter (HOSPITAL_BASED_OUTPATIENT_CLINIC_OR_DEPARTMENT_OTHER): Payer: Self-pay

## 2018-07-27 ENCOUNTER — Other Ambulatory Visit: Payer: Self-pay

## 2018-08-01 ENCOUNTER — Other Ambulatory Visit: Payer: Self-pay

## 2018-08-01 ENCOUNTER — Encounter (HOSPITAL_BASED_OUTPATIENT_CLINIC_OR_DEPARTMENT_OTHER)
Admission: RE | Admit: 2018-08-01 | Discharge: 2018-08-01 | Disposition: A | Discharge status: Home / Self Care | Payer: Medicare Other | Source: Ambulatory Visit | Attending: Surgery | Admitting: Surgery

## 2018-08-01 DIAGNOSIS — E063 Autoimmune thyroiditis: Secondary | ICD-10-CM | POA: Diagnosis not present

## 2018-08-01 DIAGNOSIS — I4891 Unspecified atrial fibrillation: Secondary | ICD-10-CM | POA: Diagnosis not present

## 2018-08-01 DIAGNOSIS — Z803 Family history of malignant neoplasm of breast: Secondary | ICD-10-CM | POA: Diagnosis not present

## 2018-08-01 DIAGNOSIS — E119 Type 2 diabetes mellitus without complications: Secondary | ICD-10-CM | POA: Diagnosis not present

## 2018-08-01 DIAGNOSIS — Z7984 Long term (current) use of oral hypoglycemic drugs: Secondary | ICD-10-CM | POA: Diagnosis not present

## 2018-08-01 DIAGNOSIS — Z79899 Other long term (current) drug therapy: Secondary | ICD-10-CM | POA: Diagnosis not present

## 2018-08-01 DIAGNOSIS — F17210 Nicotine dependence, cigarettes, uncomplicated: Secondary | ICD-10-CM | POA: Diagnosis not present

## 2018-08-01 DIAGNOSIS — Z79890 Hormone replacement therapy (postmenopausal): Secondary | ICD-10-CM | POA: Diagnosis not present

## 2018-08-01 DIAGNOSIS — I10 Essential (primary) hypertension: Secondary | ICD-10-CM | POA: Diagnosis not present

## 2018-08-01 DIAGNOSIS — Z7901 Long term (current) use of anticoagulants: Secondary | ICD-10-CM | POA: Diagnosis not present

## 2018-08-01 DIAGNOSIS — C50911 Malignant neoplasm of unspecified site of right female breast: Secondary | ICD-10-CM | POA: Diagnosis present

## 2018-08-01 DIAGNOSIS — F329 Major depressive disorder, single episode, unspecified: Secondary | ICD-10-CM | POA: Diagnosis not present

## 2018-08-01 DIAGNOSIS — Z888 Allergy status to other drugs, medicaments and biological substances status: Secondary | ICD-10-CM | POA: Diagnosis not present

## 2018-08-01 LAB — CBC WITH DIFFERENTIAL/PLATELET
Abs Immature Granulocytes: 0.02 10*3/uL (ref 0.00–0.07)
Basophils Absolute: 0.1 10*3/uL (ref 0.0–0.1)
Basophils Relative: 1 %
Eosinophils Absolute: 0.2 10*3/uL (ref 0.0–0.5)
Eosinophils Relative: 2 %
HCT: 45.1 % (ref 36.0–46.0)
Hemoglobin: 14.1 g/dL (ref 12.0–15.0)
Immature Granulocytes: 0 %
Lymphocytes Relative: 19 %
Lymphs Abs: 1.7 10*3/uL (ref 0.7–4.0)
MCH: 28.4 pg (ref 26.0–34.0)
MCHC: 31.3 g/dL (ref 30.0–36.0)
MCV: 90.9 fL (ref 80.0–100.0)
Monocytes Absolute: 0.8 10*3/uL (ref 0.1–1.0)
Monocytes Relative: 9 %
Neutro Abs: 6.1 10*3/uL (ref 1.7–7.7)
Neutrophils Relative %: 69 %
Platelets: 173 10*3/uL (ref 150–400)
RBC: 4.96 MIL/uL (ref 3.87–5.11)
RDW: 12.4 % (ref 11.5–15.5)
WBC: 8.8 10*3/uL (ref 4.0–10.5)
nRBC: 0 % (ref 0.0–0.2)

## 2018-08-01 LAB — COMPREHENSIVE METABOLIC PANEL
ALT: 13 U/L (ref 0–44)
AST: 17 U/L (ref 15–41)
Albumin: 3.9 g/dL (ref 3.5–5.0)
Alkaline Phosphatase: 79 U/L (ref 38–126)
Anion gap: 10 (ref 5–15)
BUN: 8 mg/dL (ref 8–23)
CO2: 28 mmol/L (ref 22–32)
Calcium: 9.4 mg/dL (ref 8.9–10.3)
Chloride: 103 mmol/L (ref 98–111)
Creatinine, Ser: 0.52 mg/dL (ref 0.44–1.00)
GFR calc Af Amer: >60 mL/min (ref >60)
GFR calc non Af Amer: >60 mL/min (ref >60)
Glucose, Bld: 105 mg/dL — ABNORMAL HIGH (ref 70–99)
Potassium: 4.2 mmol/L (ref 3.5–5.1)
Sodium: 141 mmol/L (ref 135–145)
Total Bilirubin: 0.4 mg/dL (ref 0.3–1.2)
Total Protein: 7 g/dL (ref 6.5–8.1)

## 2018-08-01 LAB — PROTIME-INR
INR: 1.1 (ref 0.8–1.2)
Prothrombin Time: 14.2 seconds (ref 11.4–15.2)

## 2018-08-01 NOTE — Progress Notes (Signed)
Ensure and surgical soap given with instructions, pt verbalized understanding.

## 2018-08-02 ENCOUNTER — Ambulatory Visit (HOSPITAL_BASED_OUTPATIENT_CLINIC_OR_DEPARTMENT_OTHER): Payer: Medicare Other | Admitting: Anesthesiology

## 2018-08-02 ENCOUNTER — Encounter (HOSPITAL_BASED_OUTPATIENT_CLINIC_OR_DEPARTMENT_OTHER): Payer: Self-pay | Admitting: Emergency Medicine

## 2018-08-02 ENCOUNTER — Ambulatory Visit (HOSPITAL_COMMUNITY): Payer: Medicare Other

## 2018-08-02 ENCOUNTER — Encounter (HOSPITAL_BASED_OUTPATIENT_CLINIC_OR_DEPARTMENT_OTHER)
Admission: RE | Disposition: A | Discharge status: Home / Self Care | Payer: Self-pay | Source: Home / Self Care | Attending: Surgery

## 2018-08-02 ENCOUNTER — Other Ambulatory Visit: Payer: Self-pay

## 2018-08-02 ENCOUNTER — Ambulatory Visit (HOSPITAL_BASED_OUTPATIENT_CLINIC_OR_DEPARTMENT_OTHER)
Admission: RE | Admit: 2018-08-02 | Discharge: 2018-08-02 | Disposition: A | Discharge status: Home / Self Care | Payer: Medicare Other | Attending: Surgery | Admitting: Surgery

## 2018-08-02 DIAGNOSIS — Z79890 Hormone replacement therapy (postmenopausal): Secondary | ICD-10-CM | POA: Insufficient documentation

## 2018-08-02 DIAGNOSIS — E063 Autoimmune thyroiditis: Secondary | ICD-10-CM | POA: Insufficient documentation

## 2018-08-02 DIAGNOSIS — I10 Essential (primary) hypertension: Secondary | ICD-10-CM | POA: Diagnosis not present

## 2018-08-02 DIAGNOSIS — F17210 Nicotine dependence, cigarettes, uncomplicated: Secondary | ICD-10-CM | POA: Insufficient documentation

## 2018-08-02 DIAGNOSIS — Z7984 Long term (current) use of oral hypoglycemic drugs: Secondary | ICD-10-CM | POA: Insufficient documentation

## 2018-08-02 DIAGNOSIS — I4891 Unspecified atrial fibrillation: Secondary | ICD-10-CM | POA: Insufficient documentation

## 2018-08-02 DIAGNOSIS — Z95828 Presence of other vascular implants and grafts: Secondary | ICD-10-CM

## 2018-08-02 DIAGNOSIS — Z79899 Other long term (current) drug therapy: Secondary | ICD-10-CM | POA: Insufficient documentation

## 2018-08-02 DIAGNOSIS — Z803 Family history of malignant neoplasm of breast: Secondary | ICD-10-CM | POA: Insufficient documentation

## 2018-08-02 DIAGNOSIS — E119 Type 2 diabetes mellitus without complications: Secondary | ICD-10-CM | POA: Insufficient documentation

## 2018-08-02 DIAGNOSIS — C50911 Malignant neoplasm of unspecified site of right female breast: Secondary | ICD-10-CM | POA: Insufficient documentation

## 2018-08-02 DIAGNOSIS — Z888 Allergy status to other drugs, medicaments and biological substances status: Secondary | ICD-10-CM | POA: Insufficient documentation

## 2018-08-02 DIAGNOSIS — Z452 Encounter for adjustment and management of vascular access device: Secondary | ICD-10-CM

## 2018-08-02 DIAGNOSIS — Z419 Encounter for procedure for purposes other than remedying health state, unspecified: Secondary | ICD-10-CM

## 2018-08-02 DIAGNOSIS — F329 Major depressive disorder, single episode, unspecified: Secondary | ICD-10-CM | POA: Insufficient documentation

## 2018-08-02 DIAGNOSIS — Z7901 Long term (current) use of anticoagulants: Secondary | ICD-10-CM | POA: Insufficient documentation

## 2018-08-02 HISTORY — PX: PORTACATH PLACEMENT: SHX2246

## 2018-08-02 LAB — GLUCOSE, CAPILLARY
Glucose-Capillary: 152 mg/dL — ABNORMAL HIGH (ref 70–99)
Glucose-Capillary: 124 mg/dL — ABNORMAL HIGH (ref 70–99)

## 2018-08-02 SURGERY — INSERTION, TUNNELED CENTRAL VENOUS DEVICE, WITH PORT
Anesthesia: General | Site: Neck | Laterality: Right

## 2018-08-02 MED ORDER — GABAPENTIN 300 MG PO CAPS
ORAL_CAPSULE | ORAL | Status: AC
  Filled 2018-08-02: qty 1

## 2018-08-02 MED ORDER — ONDANSETRON HCL 4 MG/2ML IJ SOLN
INTRAMUSCULAR | Status: AC
  Filled 2018-08-02: qty 2

## 2018-08-02 MED ORDER — FENTANYL CITRATE (PF) 100 MCG/2ML IJ SOLN
50.0000 ug | INTRAMUSCULAR | Status: DC | PRN
  Administered 2018-08-02: 50 ug via INTRAVENOUS

## 2018-08-02 MED ORDER — MIDAZOLAM HCL 2 MG/2ML IJ SOLN
INTRAMUSCULAR | Status: AC
  Filled 2018-08-02: qty 2

## 2018-08-02 MED ORDER — OXYCODONE HCL 5 MG/5ML PO SOLN: 5.0000 mg | Freq: Once | ORAL | Status: DC | PRN

## 2018-08-02 MED ORDER — PROPOFOL 10 MG/ML IV BOLUS
INTRAVENOUS | Status: DC | PRN
  Administered 2018-08-02: 150 mg via INTRAVENOUS

## 2018-08-02 MED ORDER — HEPARIN (PORCINE) IN NACL 1000-0.9 UT/500ML-% IV SOLN
INTRAVENOUS | Status: AC
  Filled 2018-08-02: qty 500

## 2018-08-02 MED ORDER — EPHEDRINE 5 MG/ML INJ
INTRAVENOUS | Status: AC
  Filled 2018-08-02: qty 10

## 2018-08-02 MED ORDER — HEPARIN SOD (PORK) LOCK FLUSH 100 UNIT/ML IV SOLN
INTRAVENOUS | Status: AC
  Filled 2018-08-02: qty 5

## 2018-08-02 MED ORDER — ACETAMINOPHEN 500 MG PO TABS
1000.0000 mg | ORAL_TABLET | ORAL | Status: AC
  Administered 2018-08-02: 1000 mg via ORAL

## 2018-08-02 MED ORDER — SCOPOLAMINE 1 MG/3DAYS TD PT72: 1.0000 | MEDICATED_PATCH | Freq: Once | TRANSDERMAL | Status: DC | PRN

## 2018-08-02 MED ORDER — GABAPENTIN 300 MG PO CAPS
300.0000 mg | ORAL_CAPSULE | ORAL | Status: AC
  Administered 2018-08-02: 300 mg via ORAL

## 2018-08-02 MED ORDER — ONDANSETRON HCL 4 MG/2ML IJ SOLN
INTRAMUSCULAR | Status: DC | PRN
  Administered 2018-08-02: 4 mg via INTRAVENOUS

## 2018-08-02 MED ORDER — EPHEDRINE SULFATE 50 MG/ML IJ SOLN
INTRAMUSCULAR | Status: DC | PRN
  Administered 2018-08-02 (×2): 10 mg via INTRAVENOUS

## 2018-08-02 MED ORDER — HEPARIN SOD (PORK) LOCK FLUSH 100 UNIT/ML IV SOLN
INTRAVENOUS | Status: DC | PRN
  Administered 2018-08-02: 500 [IU] via INTRAVENOUS

## 2018-08-02 MED ORDER — MIDAZOLAM HCL 2 MG/2ML IJ SOLN
1.0000 mg | INTRAMUSCULAR | Status: DC | PRN
  Administered 2018-08-02: 2 mg via INTRAVENOUS

## 2018-08-02 MED ORDER — ACETAMINOPHEN 500 MG PO TABS
ORAL_TABLET | ORAL | Status: AC
  Filled 2018-08-02: qty 2

## 2018-08-02 MED ORDER — LIDOCAINE HCL (CARDIAC) PF 100 MG/5ML IV SOSY
PREFILLED_SYRINGE | INTRAVENOUS | Status: DC | PRN
  Administered 2018-08-02: 60 mg via INTRAVENOUS

## 2018-08-02 MED ORDER — DEXAMETHASONE SODIUM PHOSPHATE 10 MG/ML IJ SOLN
INTRAMUSCULAR | Status: AC
  Filled 2018-08-02: qty 1

## 2018-08-02 MED ORDER — PROPOFOL 500 MG/50ML IV EMUL
INTRAVENOUS | Status: AC
  Filled 2018-08-02: qty 50

## 2018-08-02 MED ORDER — BUPIVACAINE-EPINEPHRINE (PF) 0.25% -1:200000 IJ SOLN
INTRAMUSCULAR | Status: AC
  Filled 2018-08-02: qty 30

## 2018-08-02 MED ORDER — CHLORHEXIDINE GLUCONATE CLOTH 2 % EX PADS: 6.0000 | MEDICATED_PAD | Freq: Once | CUTANEOUS | Status: DC

## 2018-08-02 MED ORDER — LIDOCAINE 2% (20 MG/ML) 5 ML SYRINGE
INTRAMUSCULAR | Status: AC
  Filled 2018-08-02: qty 5

## 2018-08-02 MED ORDER — HYDROCODONE-ACETAMINOPHEN 5-325 MG PO TABS: 1.0000 | ORAL_TABLET | Freq: Four times a day (QID) | ORAL | 0 refills | Status: DC | PRN

## 2018-08-02 MED ORDER — FENTANYL CITRATE (PF) 100 MCG/2ML IJ SOLN: 25.0000 ug | INTRAMUSCULAR | Status: DC | PRN

## 2018-08-02 MED ORDER — ONDANSETRON HCL 4 MG/2ML IJ SOLN: 4.0000 mg | Freq: Once | INTRAMUSCULAR | Status: DC | PRN

## 2018-08-02 MED ORDER — LACTATED RINGERS IV SOLN
INTRAVENOUS | Status: DC
  Administered 2018-08-02: 07:00:00 via INTRAVENOUS

## 2018-08-02 MED ORDER — HEPARIN (PORCINE) IN NACL 2-0.9 UNITS/ML
INTRAMUSCULAR | Status: AC | PRN
  Administered 2018-08-02: 1 via INTRAVENOUS

## 2018-08-02 MED ORDER — BUPIVACAINE-EPINEPHRINE 0.25% -1:200000 IJ SOLN
INTRAMUSCULAR | Status: DC | PRN
  Administered 2018-08-02: 15 mL

## 2018-08-02 MED ORDER — FENTANYL CITRATE (PF) 100 MCG/2ML IJ SOLN
INTRAMUSCULAR | Status: AC
  Filled 2018-08-02: qty 2

## 2018-08-02 MED ORDER — CEFAZOLIN SODIUM-DEXTROSE 2-4 GM/100ML-% IV SOLN
INTRAVENOUS | Status: AC
  Filled 2018-08-02: qty 100

## 2018-08-02 MED ORDER — OXYCODONE HCL 5 MG PO TABS: 5.0000 mg | ORAL_TABLET | Freq: Once | ORAL | Status: DC | PRN

## 2018-08-02 MED ORDER — DEXAMETHASONE SODIUM PHOSPHATE 4 MG/ML IJ SOLN
INTRAMUSCULAR | Status: DC | PRN
  Administered 2018-08-02: 10 mg via INTRAVENOUS

## 2018-08-02 MED ORDER — DEXTROSE 5 % IV SOLN
3.0000 g | INTRAVENOUS | Status: AC
  Administered 2018-08-02: 2 g via INTRAVENOUS

## 2018-08-02 SURGICAL SUPPLY — 61 items
ADH SKN CLS APL DERMABOND .7 (GAUZE/BANDAGES/DRESSINGS) ×1
APL PRP STRL LF DISP 70% ISPRP (MISCELLANEOUS) ×1
APL SKNCLS STERI-STRIP NONHPOA (GAUZE/BANDAGES/DRESSINGS)
BAG DECANTER FOR FLEXI CONT (MISCELLANEOUS) ×3 IMPLANT
BENZOIN TINCTURE PRP APPL 2/3 (GAUZE/BANDAGES/DRESSINGS) IMPLANT
BLADE HEX COATED 2.75 (ELECTRODE) ×3 IMPLANT
BLADE SURG 11 STRL SS (BLADE) ×3 IMPLANT
BLADE SURG 15 STRL LF DISP TIS (BLADE) ×1 IMPLANT
BLADE SURG 15 STRL SS (BLADE) ×3
CANISTER SUCT 1200ML W/VALVE (MISCELLANEOUS) IMPLANT
CHLORAPREP W/TINT 26 (MISCELLANEOUS) ×3 IMPLANT
CLOSURE WOUND 1/2 X4 (GAUZE/BANDAGES/DRESSINGS)
COVER BACK TABLE REUSABLE LG (DRAPES) ×3 IMPLANT
COVER MAYO STAND REUSABLE (DRAPES) ×3 IMPLANT
COVER PROBE 5X48 (MISCELLANEOUS) ×3
COVER WAND RF STERILE (DRAPES) IMPLANT
DECANTER SPIKE VIAL GLASS SM (MISCELLANEOUS) IMPLANT
DERMABOND ADVANCED (GAUZE/BANDAGES/DRESSINGS) ×2
DERMABOND ADVANCED .7 DNX12 (GAUZE/BANDAGES/DRESSINGS) ×1 IMPLANT
DRAPE C-ARM 42X72 X-RAY (DRAPES) ×3 IMPLANT
DRAPE LAPAROSCOPIC ABDOMINAL (DRAPES) ×3 IMPLANT
DRAPE UTILITY XL STRL (DRAPES) ×3 IMPLANT
DRSG TEGADERM 2-3/8X2-3/4 SM (GAUZE/BANDAGES/DRESSINGS) IMPLANT
DRSG TEGADERM 4X4.75 (GAUZE/BANDAGES/DRESSINGS) IMPLANT
ELECT REM PT RETURN 9FT ADLT (ELECTROSURGICAL) ×3
ELECTRODE REM PT RTRN 9FT ADLT (ELECTROSURGICAL) ×1 IMPLANT
GAUZE SPONGE 4X4 12PLY STRL LF (GAUZE/BANDAGES/DRESSINGS) IMPLANT
GLOVE BIOGEL PI IND STRL 8 (GLOVE) ×1 IMPLANT
GLOVE BIOGEL PI INDICATOR 8 (GLOVE) ×2
GLOVE ECLIPSE 8.0 STRL XLNG CF (GLOVE) ×3 IMPLANT
GOWN STRL REUS W/ TWL LRG LVL3 (GOWN DISPOSABLE) ×2 IMPLANT
GOWN STRL REUS W/TWL LRG LVL3 (GOWN DISPOSABLE) ×6
IV KIT MINILOC 20X1 SAFETY (NEEDLE) IMPLANT
KIT CVR 48X5XPRB PLUP LF (MISCELLANEOUS) ×1 IMPLANT
KIT PORT POWER 8FR ISP CVUE (Port) ×2 IMPLANT
NDL HYPO 25X1 1.5 SAFETY (NEEDLE) ×1 IMPLANT
NDL SAFETY ECLIPSE 18X1.5 (NEEDLE) IMPLANT
NDL SPNL 22GX3.5 QUINCKE BK (NEEDLE) IMPLANT
NEEDLE HYPO 18GX1.5 SHARP (NEEDLE)
NEEDLE HYPO 22GX1.5 SAFETY (NEEDLE) IMPLANT
NEEDLE HYPO 25X1 1.5 SAFETY (NEEDLE) ×3 IMPLANT
NEEDLE SPNL 22GX3.5 QUINCKE BK (NEEDLE) IMPLANT
PACK BASIN DAY SURGERY FS (CUSTOM PROCEDURE TRAY) ×3 IMPLANT
PENCIL BUTTON HOLSTER BLD 10FT (ELECTRODE) ×3 IMPLANT
SET SHEATH INTRODUCER 10FR (MISCELLANEOUS) IMPLANT
SHEATH COOK PEEL AWAY SET 9F (SHEATH) IMPLANT
SLEEVE SCD COMPRESS KNEE MED (MISCELLANEOUS) ×3 IMPLANT
SPONGE LAP 4X18 RFD (DISPOSABLE) IMPLANT
STRIP CLOSURE SKIN 1/2X4 (GAUZE/BANDAGES/DRESSINGS) IMPLANT
SUT MON AB 4-0 PC3 18 (SUTURE) ×3 IMPLANT
SUT PROLENE 2 0 CT2 30 (SUTURE) IMPLANT
SUT PROLENE 2 0 SH DA (SUTURE) ×3 IMPLANT
SUT SILK 2 0 TIES 17X18 (SUTURE)
SUT SILK 2-0 18XBRD TIE BLK (SUTURE) IMPLANT
SUT VICRYL 3-0 CR8 SH (SUTURE) ×3 IMPLANT
SYR 5ML LUER SLIP (SYRINGE) ×3 IMPLANT
SYR CONTROL 10ML LL (SYRINGE) ×3 IMPLANT
TOWEL GREEN STERILE FF (TOWEL DISPOSABLE) ×6 IMPLANT
TUBE CONNECTING 20'X1/4 (TUBING)
TUBE CONNECTING 20X1/4 (TUBING) IMPLANT
YANKAUER SUCT BULB TIP NO VENT (SUCTIONS) IMPLANT

## 2018-08-02 NOTE — Op Note (Signed)
Preoperative diagnosis: PAC needed  Postoperative diagnosis: Same  Procedure: Portacath Placement with C arm and U/S guidance   Surgeon: Turner Daniels, MD, FACS  Anesthesia: General and 0.25 % marcaine with epinephrine  Clinical History and Indications: The patient is getting ready to begin chemotherapy for her cancer. She  needs a Port-A-Cath for venous access. Risk of bleeding, infection,  Collapse lung,  Death,  DVT,  Organ injury,  Mediastinal injury,  Injury to heart,  Injury to blood vessels,  Nerves,  Migration of catheter,  Embolization of catheter and the need for more surgery.  Description of Procedure: I have seen the patient in the holding area and confirmed the plans for the procedure as noted above. I reviewed the risks and complications again and the patient has no further questions. She wishes to proceed.   The patient was then taken to the operating room. After satisfactory general  anesthesia had been obtained the upper chest and lower neck were prepped and draped as a sterile field. The timeout was done.  The right internal jugular vein  was entered under U/S guidance  and the guidewire threaded into the superior vena cava right atrial area under fluoroscopic guidance. An incision was then made on the anterior chest wall and a subcutaneous pocket fashioned for the port reservoir.  The port tubing was then brought through a subcutaneous tunnel from the port site to the guidewire site.  The port and catheter were attached, locked  and flushed. The catheter was measured and cut to appropriate length.The dilator and peel-away sheath were then advanced over the guidewire while monitoring this with fluoroscopy. The guidewire and dilator were removed and the tubing threaded to approximately 21 cm. The peel-away sheath was then removed. The catheter aspirated and flushed easily. Using fluoroscopy the tip was in the superior vena cava right atrial junction area. It aspirated and  flushed easily. That aspirated and flushed easily.  The reservoir was secured to the fascia with 1 sutures of 2-0 Prolene. A final check with fluoroscopy was done to make sure we had no kinks and good positioning of the tip of the catheter. Everything appeared to be okay. The catheter was aspirated, flushed with dilute heparin and then concentrated aqueous heparin.  The incision was then closed with interrupted 3-0 Vicryl, and 4-0 Monocryl subcuticular with Dermabond on the skin.  There were no operative complications. Estimated blood loss was minimal. All counts were correct. The patient tolerated the procedure well.  Turner Daniels, MD, FACS

## 2018-08-02 NOTE — Anesthesia Procedure Notes (Signed)
Procedure Name: LMA Insertion Date/Time: 08/02/2018 7:31 AM Performed by: Lyndee Leo, CRNA Pre-anesthesia Checklist: Patient identified, Emergency Drugs available, Suction available and Patient being monitored Patient Re-evaluated:Patient Re-evaluated prior to induction Oxygen Delivery Method: Circle system utilized Preoxygenation: Pre-oxygenation with 100% oxygen Induction Type: IV induction Ventilation: Mask ventilation without difficulty LMA: LMA inserted LMA Size: 4.0 Number of attempts: 1 Airway Equipment and Method: Bite block Placement Confirmation: positive ETCO2 Tube secured with: Tape Dental Injury: Teeth and Oropharynx as per pre-operative assessment

## 2018-08-02 NOTE — Interval H&P Note (Signed)
History and Physical Interval Note:  08/02/2018 7:19 AM  Sabrina [name]  has presented today for surgery, with the diagnosis of POOR VENOUS ACCESS.  The various methods of treatment have been discussed with the patient and family. After consideration of risks, benefits and other options for treatment, the patient has consented to  Procedure(s): INSERTION PORT-A-CATH WITH ULTRASOUND (N/A) as a surgical intervention.  The patient's history has been reviewed, patient examined, no change in status, stable for surgery.  I have reviewed the patient's chart and labs.  Questions were answered to the patient's satisfaction.     [place]

## 2018-08-02 NOTE — Transfer of Care (Signed)
Immediate Anesthesia Transfer of Care Note  Patient: Sabrina [name]  Procedure(s) Performed: INSERTION PORT-A-CATH WITH ULTRASOUND (Right Neck)  Patient Location: PACU  Anesthesia Type:General  Level of Consciousness: awake, sedated and patient cooperative  Airway & Oxygen Therapy: Patient Spontanous Breathing and Patient connected to face mask oxygen  Post-op Assessment: Report given to RN and Post -op Vital signs reviewed and stable  Post vital signs: Reviewed and stable  Last Vitals:  Vitals Value Taken Time  BP    Temp    Pulse 79 08/02/2018  8:18 AM  Resp 22 08/02/2018  8:18 AM  SpO2 98 % 08/02/2018  8:18 AM  Vitals shown include unvalidated device data.  Last Pain:  Vitals:   08/02/18 0651  TempSrc: Oral  PainSc: 0-No pain         Complications: No apparent anesthesia complications

## 2018-08-02 NOTE — H&P (Signed)
Sabrina [name] is an 68 y.o. female.   Chief Complaint: need port for chemotherapy HPI: Pt presents for port placement for chemotherapy for breast cancer.  Past Medical History:  Diagnosis Date  . Atrial fibrillation ([place]) [I48.91] 11/08/2016  . Benign essential hypertension 08/25/2015  . Diabetes ([place])   . Family history of breast cancer   . Family history of leukemia   . Family history of prostate cancer   . Hyperlipidemia   . Hypothyroidism due to Hashimoto's thyroiditis 05/23/2015    Past Surgical History:  Procedure Laterality Date  . ABDOMINAL HYSTERECTOMY    . APPENDECTOMY    . BREAST LUMPECTOMY WITH RADIOACTIVE SEED AND SENTINEL LYMPH NODE BIOPSY Right 07/13/2018   Procedure: RIGHT BREAST LUMPECTOMY WITH RADIOACTIVE SEED AND RIGHT SENTINEL LYMPH NODE MAPPING;  Surgeon: Erroll Luna, MD;  Location: [place];  Service: General;  Laterality: Right;  . BREAST SURGERY    . CHOLECYSTECTOMY    . EYE SURGERY    . KNEE SURGERY      Family History  Problem Relation Age of Onset  . Prostate cancer Father   . Cancer Brother   . Acute myelogenous leukemia Brother   . Alcohol abuse Maternal Aunt   . Hypertension Maternal Aunt   . Breast cancer Maternal Aunt        dx over 68  . Alcohol abuse Paternal Uncle   . Diabetes Paternal Uncle   . Hypertension Maternal Grandmother   . Breast cancer Mother 29       Deceased at 75  . Cancer Maternal Uncle   . Diabetes Maternal Uncle   . Alcohol abuse Maternal Uncle   . Diabetes Son   . Breast cancer Cousin        dx over 32  . Breast cancer Cousin 47   Social History:  reports that she has been smoking. She has been smoking about 2.00 packs per day. She has never used smokeless tobacco. She reports that she does not drink alcohol or use drugs.  Allergies:  Allergies  Allergen Reactions  . Atorvastatin Other (See Comments)    Muscle aches  . Meperidine Nausea And Vomiting    Medications Prior to Admission   Medication Sig Dispense Refill  . digoxin (DIGOX) 0.125 MG tablet Take 1 tablet (125 mcg total) by mouth daily. 90 tablet 3  . escitalopram (LEXAPRO) 10 MG tablet Take 1 tablet by mouth daily.    . flecainide (TAMBOCOR) 100 MG tablet Take 1 tablet (100 mg total) by mouth 2 (two) times daily. 180 tablet 3  . levothyroxine (SYNTHROID, LEVOTHROID) 100 MCG tablet Take 1 tablet by mouth daily.    Marland Kitchen lisinopril (PRINIVIL,ZESTRIL) 10 MG tablet Take 1 tablet (10 mg total) by mouth daily. 90 tablet 3  . metFORMIN (GLUCOPHAGE) 500 MG tablet Take 1 tablet by mouth 2 (two) times daily with a meal.     . metoprolol succinate (TOPROL-XL) 50 MG 24 hr tablet Take 1 tablet (50 mg total) by mouth daily. 90 tablet 3  . warfarin (COUMADIN) 10 MG tablet Take as directed by [facility] 85 tablet 0  . ibuprofen (ADVIL,MOTRIN) 800 MG tablet Take 1 tablet (800 mg total) by mouth every 8 (eight) hours as needed. 30 tablet 0  . lidocaine-prilocaine (EMLA) cream Apply to affected area once 30 g 3  . ondansetron (ZOFRAN) 8 MG tablet Take 1 tablet (8 mg total) by mouth 2 (two) times daily as needed (Nausea or vomiting). [place]  tablet 1  . prochlorperazine (COMPAZINE) 10 MG tablet Take 1 tablet (10 mg total) by mouth every 6 (six) hours as needed (Nausea or vomiting). 30 tablet 1    Results for orders placed or performed during the hospital encounter of 08/02/18 (from the past 48 hour(s))  CBC WITH DIFFERENTIAL     Status: None   Collection Time: 08/01/18  2:40 PM  Result Value Ref Range   WBC 8.8 4.0 - 10.5 K/uL   RBC 4.96 3.87 - 5.11 MIL/uL   Hemoglobin 14.1 12.0 - 15.0 g/dL   HCT 45.1 36.0 - 46.0 %   MCV 90.9 80.0 - 100.0 fL   MCH 28.4 26.0 - 34.0 pg   MCHC 31.3 30.0 - 36.0 g/dL   RDW 12.4 11.5 - 15.5 %   Platelets 173 150 - 400 K/uL   nRBC 0.0 0.0 - 0.2 %   Neutrophils Relative % 69 %   Neutro Abs 6.1 1.7 - 7.7 K/uL   Lymphocytes Relative 19 %   Lymphs Abs 1.7 0.7 - 4.0 K/uL   Monocytes Relative 9 %    Monocytes Absolute 0.8 0.1 - 1.0 K/uL   Eosinophils Relative 2 %   Eosinophils Absolute 0.2 0.0 - 0.5 K/uL   Basophils Relative 1 %   Basophils Absolute 0.1 0.0 - 0.1 K/uL   Immature Granulocytes 0 %   Abs Immature Granulocytes 0.02 0.00 - 0.07 K/uL    Comment: Performed at [facility], [address] [place], [place] [address]  Comprehensive metabolic panel     Status: Abnormal   Collection Time: 08/01/18  2:40 PM  Result Value Ref Range   Sodium 141 135 - 145 mmol/L   Potassium 4.2 3.5 - 5.1 mmol/L   Chloride 103 98 - 111 mmol/L   CO2 28 22 - 32 mmol/L   Glucose, Bld 105 (H) 70 - 99 mg/dL   BUN 8 8 - 23 mg/dL   Creatinine, Ser 0.52 0.44 - 1.00 mg/dL   Calcium 9.4 8.9 - 10.3 mg/dL   Total Protein 7.0 6.5 - 8.1 g/dL   Albumin 3.9 3.5 - 5.0 g/dL   AST 17 15 - 41 U/L   ALT 13 0 - 44 U/L   Alkaline Phosphatase 79 38 - 126 U/L   Total Bilirubin 0.4 0.3 - 1.2 mg/dL   GFR calc non Af Amer >60 >60 mL/min   GFR calc Af Amer >60 >60 mL/min   Anion gap 10 5 - 15    Comment: Performed at [facility], [address], [place] [address]  PT- INR at PAT visit (Pre-admission Testing)     Status: None   Collection Time: 08/01/18  2:40 PM  Result Value Ref Range   Prothrombin Time 14.2 11.4 - 15.2 seconds   INR 1.1 0.8 - 1.2    Comment: (NOTE) INR goal varies based on device and disease states. Performed at [facility], [place] [address] [place], [place] [address]   Glucose, capillary     Status: Abnormal   Collection Time: 08/02/18  6:54 AM  Result Value Ref Range   Glucose-Capillary 152 (H) 70 - 99 mg/dL   No results found.  Review of Systems  All other systems reviewed and are negative.   Blood pressure (!) 152/82, pulse 72, temperature 98.6 F (37 C), temperature source Oral, resp. rate 16, height 5\' 9"  (1.753 m), weight 82 kg, SpO2 97 %. Physical Exam  Constitutional:  She appears well-developed.  HENT:  Head: Normocephalic.  Eyes: Pupils are  equal, round, and reactive to light.  Neck: Normal range of motion.  Cardiovascular:  A fib  Respiratory: Effort normal.  Right breast incision intact   GI: Soft.  Neurological: She is alert.  Skin: Skin is warm.     Assessment/Plan Poor venous access/ breast cancer Port placement  Risks discussed as well as other options  Risk of bleeding infection collapse lung bleeding around lung  Cardiac injury death blood clots port maLfunction/migration embolization    Turner Daniels, MD 08/02/2018, 7:16 AM

## 2018-08-02 NOTE — Anesthesia Preprocedure Evaluation (Addendum)
Anesthesia Evaluation  Patient identified by MRN, date of birth, ID band Patient awake    Reviewed: Allergy & Precautions, NPO status , Patient's Chart, lab work & pertinent test results  History of Anesthesia Complications Negative for: history of anesthetic complications  Airway Mallampati: II  TM Distance: >3 FB Neck ROM: Full    Dental  (+) Edentulous Upper, Edentulous Lower   Pulmonary neg pulmonary ROS, Current Smoker,    Pulmonary exam normal        Cardiovascular hypertension, Pt. on medications and Pt. on home beta blockers Normal cardiovascular exam+ dysrhythmias Atrial Fibrillation      Neuro/Psych PSYCHIATRIC DISORDERS Depression negative neurological ROS     GI/Hepatic negative GI ROS, Neg liver ROS,   Endo/Other  diabetes, Oral Hypoglycemic AgentsHypothyroidism   Renal/GU negative Renal ROS  negative genitourinary   Musculoskeletal negative musculoskeletal ROS (+)   Abdominal   Peds  Hematology negative hematology ROS (+)   Anesthesia Other Findings TTE 02/2018: EF 55-60%, grade 2 dd, moderately dilated ascending aorta, valves OK  Reproductive/Obstetrics                            Anesthesia Physical Anesthesia Plan  ASA: III  Anesthesia Plan: General   Post-op Pain Management:    Induction: Intravenous  PONV Risk Score and Plan: 2 and Ondansetron, Dexamethasone, Midazolam and Treatment may vary due to age or medical condition  Airway Management Planned: LMA  Additional Equipment: None  Intra-op Plan:   Post-operative Plan: Extubation in OR  Informed Consent: I have reviewed the patients History and Physical, chart, labs and discussed the procedure including the risks, benefits and alternatives for the proposed anesthesia with the patient or authorized representative who has indicated his/her understanding and acceptance.     Dental advisory given  Plan  Discussed with:   Anesthesia Plan Comments:        Anesthesia Quick Evaluation

## 2018-08-02 NOTE — Anesthesia Postprocedure Evaluation (Signed)
Anesthesia Post Note  Patient: Sabrina [name]  Procedure(s) Performed: INSERTION PORT-A-CATH WITH ULTRASOUND (Right Neck)     Patient location during evaluation: PACU Anesthesia Type: General Level of consciousness: awake and alert Pain management: pain level controlled Vital Signs Assessment: post-procedure vital signs reviewed and stable Respiratory status: spontaneous breathing, nonlabored ventilation and respiratory function stable Cardiovascular status: blood pressure returned to baseline and stable Postop Assessment: no apparent nausea or vomiting Anesthetic complications: no    Last Vitals:  Vitals:   08/02/18 0845 08/02/18 0858  BP: (!) 121/48 (!) 172/80  Pulse: 74 83  Resp: (!) 23 10  Temp:  36.6 C  SpO2: 100% 98%    Last Pain:  Vitals:   08/02/18 0915  TempSrc:   PainSc: 0-No pain                 [name]

## 2018-08-02 NOTE — Discharge Instructions (Signed)
PORT-A-CATH: POST OP INSTRUCTIONS  Always review your discharge instruction sheet given to you by the facility where your surgery was performed.   1. A prescription for pain medication may be given to you upon discharge. Take your pain medication as prescribed, if needed. If narcotic pain medicine is not needed, then you make take acetaminophen (Tylenol) or ibuprofen (Advil) as needed.  2. Take your usually prescribed medications unless otherwise directed. 3. If you need a refill on your pain medication, please contact our office. All narcotic pain medicine now requires a paper prescription.  Phoned in and fax refills are no longer allowed by law.  Prescriptions will not be filled after 5 pm or on weekends.  4. You should follow a light diet for the remainder of the day after your procedure. 5. Most patients will experience some mild swelling and/or bruising in the area of the incision. It may take several days to resolve. 6. It is common to experience some constipation if taking pain medication after surgery. Increasing fluid intake and taking a stool softener (such as Colace) will usually help or prevent this problem from occurring. A mild laxative (Milk of Magnesia or Miralax) should be taken according to package directions if there are no bowel movements after 48 hours.  7. Unless discharge instructions indicate otherwise, you may remove your bandages 48 hours after surgery, and you may shower at that time. You may have steri-strips (small white skin tapes) in place directly over the incision.  These strips should be left on the skin for 7-10 days.  If your surgeon used Dermabond (skin glue) on the incision, you may shower in 24 hours.  The glue will flake off over the next 2-3 weeks.  8. If your port is left accessed at the end of surgery (needle left in port), the dressing cannot get wet and should only by changed by a healthcare professional. When the port is no longer accessed (when the  needle has been removed), follow step 7.   9. ACTIVITIES:  Limit activity involving your arms for the next 72 hours. Do no strenuous exercise or activity for 1 week. You may drive when you are no longer taking prescription pain medication, you can comfortably wear a seatbelt, and you can maneuver your car. 10.You may need to see your doctor in the office for a follow-up appointment.  Please       check with your doctor.  11.When you receive a new Port-a-Cath, you will get a product guide and        ID card.  Please keep them in case you need them.  WHEN TO CALL YOUR DOCTOR [phone]): 1. Fever over 101.0 2. Chills 3. Continued bleeding from incision 4. Increased redness and tenderness at the site 5. Shortness of breath, difficulty breathing   The clinic staff is available to answer your questions during regular business hours. Please dont hesitate to call and ask to speak to one of the nurses or medical assistants for clinical concerns. If you have a medical emergency, go to the nearest emergency room or call 911.  A surgeon from [facility] is always on call at the hospital.     For further information, please visit [email or web address]   Post Anesthesia Home Care Instructions  Activity: Get plenty of rest for the remainder of the day. A responsible individual must stay with you for 24 hours following the procedure.  For the next 24 hours, DO NOT: -Drive a car -  alcoholic beverages -Take any medication unless instructed by your physician -Make any legal decisions or sign important papers.  Meals: Start with liquid foods such as gelatin or soup. Progress to regular foods as tolerated. Avoid greasy, spicy, heavy foods. If nausea and/or vomiting occur, drink only clear liquids until the nausea and/or vomiting subsides. Call your physician if vomiting continues.  Special Instructions/Symptoms: Your throat may feel dry or sore from the  anesthesia or the breathing tube placed in your throat during surgery. If this causes discomfort, gargle with warm salt water. The discomfort should disappear within 24 hours.        

## 2018-08-03 ENCOUNTER — Encounter (HOSPITAL_BASED_OUTPATIENT_CLINIC_OR_DEPARTMENT_OTHER): Payer: Self-pay | Admitting: Surgery

## 2018-08-06 ENCOUNTER — Other Ambulatory Visit: Payer: Self-pay | Admitting: Cardiology

## 2018-08-07 ENCOUNTER — Other Ambulatory Visit: Payer: Self-pay | Admitting: *Deleted

## 2018-08-07 ENCOUNTER — Telehealth: Payer: Self-pay | Admitting: Pharmacist

## 2018-08-07 DIAGNOSIS — C50411 Malignant neoplasm of upper-outer quadrant of right female breast: Secondary | ICD-10-CM

## 2018-08-07 DIAGNOSIS — I480 Paroxysmal atrial fibrillation: Secondary | ICD-10-CM

## 2018-08-07 DIAGNOSIS — Z170 Estrogen receptor positive status [ER+]: Principal | ICD-10-CM

## 2018-08-07 NOTE — Telephone Encounter (Signed)
-----   Message from Nicholas Lose, MD sent at 08/07/2018  2:56 PM EDT ----- Regarding: RE: INR check with Cancer center visit Sure we can do that. Merleen Nicely, please add PT INR to chemo labs Thanks VG ----- Message ----- From: Erskine Emery, [facility] Sent: 08/07/2018   2:34 PM EDT To: Nicholas Lose, MD Subject: INR check with Cancer center visit             Dr. Lindi Adie,  Pt requested me to ask if you are able to draw INR with her upcoming Cancer center visit. We will be happy to dose INR if you send it to our office. She is wishing to decrease visits to decrease potential exposure to COVID. Please let me know if you are able to accommodate this request so that we can ensure appropriate follow up. Thank you!  Georgina Peer

## 2018-08-07 NOTE — Telephone Encounter (Signed)
Spoke with patient who states that had INR checked last week at hospital - INR 1.1 (she was off couamdin for 5 days prior to procedure - ok per Dr. Raliegh Ip.). She is requesting to have INR checked at [facility] with Dr. Georgette Shell. I have sent a message to them to see if this can be coordinated. She states she is out of Coumadin and needs a refill. Will send 1 month now due to being out.

## 2018-08-07 NOTE — Telephone Encounter (Signed)
Rx refill sent to pharmacy. 

## 2018-08-07 NOTE — Telephone Encounter (Signed)
Dr. Geralyn Flash office with check INR and send to Coumadin clinic to dose.

## 2018-08-10 ENCOUNTER — Telehealth: Payer: Self-pay | Admitting: *Deleted

## 2018-08-10 ENCOUNTER — Inpatient Hospital Stay: Payer: Medicare Other

## 2018-08-14 NOTE — Assessment & Plan Note (Signed)
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   Pathology counseling: I discussed the final pathology report of the patient provided  a copy of this report. I discussed the margins as well as lymph node surgeries. We also discussed the final staging along with previously performed ER/PR and HER-2/neu testing.  Treatment plan: 1.  Adj chemo with Taxol-Herceptin weekly X 12 and then q 3 weeks Herceptin. 2.  Adjuvant radiation therapy 3.  Followed by adjuvant antiestrogen therapy  She had an echocardiogram in November 2019.  EF was normal.  We will plan to do echocardiograms every 3 months from now. ------------------------------------------------------------------------------------------------------------------------------ Current treatment: Cycle 1 day 1 Taxol Herceptin Labs reviewed Chemo consent obtained Chemo education completed Return to clinic in 1 week for toxicity check 

## 2018-08-16 NOTE — Progress Notes (Signed)
Patient Care Team: Enid Skeens., MD as PCP - General (Family Medicine) Mauro Kaufmann, RN as Oncology Nurse Navigator Rockwell Germany, RN as Oncology Nurse Navigator Erroll Luna, MD as Consulting Physician (General Surgery) Nicholas Lose, MD as Consulting Physician (Hematology and Oncology) Eppie Gibson, MD as Attending Physician (Radiation Oncology)  DIAGNOSIS:    ICD-10-CM   1. Malignant neoplasm of upper-outer quadrant of right breast in female, estrogen receptor positive ([place]) C50.411    Z17.0     SUMMARY OF ONCOLOGIC HISTORY:   Malignant neoplasm of upper-outer quadrant of right breast in female, estrogen receptor positive ([place])   06/19/2018 Initial Diagnosis    Screening mammogram detected right breast mass at 9 o'clock position 3 cm from the nipple, ultrasound revealed 7 x 5 x 5 mm mass, axillary ultrasound normal-appearing lymph nodes, ultrasound biopsy: Grade 1 IDC ER 100%, PR 10%, Ki-67 10%, HER-2 3+ positive, T1b N0 stage Ia    06/28/2018 Cancer Staging    Staging form: Breast, AJCC 8th Edition - Clinical stage from 06/28/2018: Stage IA (cT1b, cN0, cM0, G1, ER+, PR+, HER2+) - Signed by Nicholas Lose, MD on 06/28/2018    07/05/2018 Genetic Testing    Negative genetic testing on the common hereditary cancer panel.  The Hereditary Gene Panel offered by [facility] includes sequencing and/or deletion duplication testing of the following 48 genes: APC, ATM, AXIN2, BARD1, BMPR1A, BRCA1, BRCA2, BRIP1, CDH1, CDK4, CDKN2A (p14ARF), CDKN2A (p16INK4a), CHEK2, CTNNA1, DICER1, EPCAM (Deletion/duplication testing only), GREM1 (promoter region deletion/duplication testing only), KIT, MEN1, MLH1, MSH2, MSH3, MSH6, MUTYH, NBN, NF1, NHTL1, PALB2, PDGFRA, PMS2, POLD1, POLE, PTEN, RAD50, RAD51C, RAD51D, RNF43, SDHB, SDHC, SDHD, SMAD4, SMARCA4. STK11, TP53, TSC1, TSC2, and VHL.  The following genes were evaluated for sequence changes only: SDHA and HOXB13 c.251G>A variant only. The report date  is July 05, 2018.    07/13/2018 Surgery    Right lumpectomy ([facility]): IDC with DCIS, 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     08/17/2018 -  Chemotherapy    The patient had trastuzumab (HERCEPTIN) 315 mg in sodium chloride 0.9 % 250 mL chemo infusion, 4 mg/kg = 315 mg, Intravenous,  Once, 0 of 16 cycles PACLitaxel (TAXOL) 162 mg in sodium chloride 0.9 % 250 mL chemo infusion (</= 62m/m2), 80 mg/m2 = 162 mg, Intravenous,  Once, 0 of 3 cycles  for chemotherapy treatment.      CHIEF COMPLIANT: Follow-up to begin chemotherapy   INTERVAL HISTORY: [name] a 68y.o. with above-mentioned history of right breast cancer treated with lumpectomy. Her port was placed by Dr. CBrantley Stageon 08/02/18. She presents to the clinic today to begin adjuvant chemotherapy with weekly Taxol and Herceptin.   REVIEW OF SYSTEMS:   Constitutional: Denies fevers, chills or abnormal weight loss Eyes: Denies blurriness of vision Ears, nose, mouth, throat, and face: Denies mucositis or sore throat Respiratory: Denies cough, dyspnea or wheezes Cardiovascular: Denies palpitation, chest discomfort Gastrointestinal: Denies nausea, heartburn or change in bowel habits Skin: Denies abnormal skin rashes Lymphatics: Denies new lymphadenopathy or easy bruising Neurological: Denies numbness, tingling or new weaknesses Behavioral/Psych: Mood is stable, no new changes  Extremities: No lower extremity edema Breast: denies any pain or lumps or nodules in either breasts All other systems were reviewed with the patient and are negative.  I have reviewed the past medical history, past surgical history, social history and family history with the patient and they are  unchanged from previous note.  ALLERGIES:  is allergic to atorvastatin and meperidine.  MEDICATIONS:  Current Outpatient Medications  Medication Sig Dispense Refill  . digoxin (LANOXIN) 0.125 MG tablet TAKE 1  TABLET DAILY 90 tablet 1  . escitalopram (LEXAPRO) 10 MG tablet Take 1 tablet by mouth daily.    . flecainide (TAMBOCOR) 100 MG tablet Take 1 tablet (100 mg total) by mouth 2 (two) times daily. 180 tablet 3  . HYDROcodone-acetaminophen (NORCO/VICODIN) 5-325 MG tablet Take 1 tablet by mouth every 6 (six) hours as needed for moderate pain. 15 tablet 0  . ibuprofen (ADVIL,MOTRIN) 800 MG tablet Take 1 tablet (800 mg total) by mouth every 8 (eight) hours as needed. 30 tablet 0  . levothyroxine (SYNTHROID, LEVOTHROID) 100 MCG tablet Take 1 tablet by mouth daily.    Marland Kitchen lidocaine-prilocaine (EMLA) cream Apply to affected area once 30 g 3  . lisinopril (PRINIVIL,ZESTRIL) 10 MG tablet Take 1 tablet (10 mg total) by mouth daily. 90 tablet 3  . metFORMIN (GLUCOPHAGE) 500 MG tablet Take 1 tablet by mouth 2 (two) times daily with a meal.     . metoprolol succinate (TOPROL-XL) 50 MG 24 hr tablet Take 1 tablet (50 mg total) by mouth daily. 90 tablet 3  . ondansetron (ZOFRAN) 8 MG tablet Take 1 tablet (8 mg total) by mouth 2 (two) times daily as needed (Nausea or vomiting). 30 tablet 1  . prochlorperazine (COMPAZINE) 10 MG tablet Take 1 tablet (10 mg total) by mouth every 6 (six) hours as needed (Nausea or vomiting). 30 tablet 1  . warfarin (COUMADIN) 10 MG tablet TAKE AS DIRECTED BY COUMADIN CLINIC 30 tablet 0   No current facility-administered medications for this visit.     PHYSICAL EXAMINATION: ECOG PERFORMANCE STATUS: 0 - Asymptomatic  Vitals:   08/17/18 0958  BP: (!) 165/82  Pulse: 80  Resp: 19  Temp: 98.1 F (36.7 C)  SpO2: 96%   Filed Weights   08/17/18 0958  Weight: 180 lb 9.6 oz (81.9 kg)    GENERAL: alert, no distress and comfortable SKIN: skin color, texture, turgor are normal, no rashes or significant lesions EYES: normal, Conjunctiva are pink and non-injected, sclera clear OROPHARYNX: no exudate, no erythema and lips, buccal mucosa, and tongue normal  NECK: supple, thyroid normal  size, non-tender, without nodularity LYMPH: no palpable lymphadenopathy in the cervical, axillary or inguinal LUNGS: clear to auscultation and percussion with normal breathing effort HEART: regular rate & rhythm and no murmurs and no lower extremity edema ABDOMEN: abdomen soft, non-tender and normal bowel sounds MUSCULOSKELETAL: no cyanosis of digits and no clubbing  NEURO: alert & oriented x 3 with fluent speech, no focal motor/sensory deficits EXTREMITIES: No lower extremity edema  LABORATORY DATA:  I have reviewed the data as listed CMP Latest Ref Rng & Units 08/01/2018 06/28/2018  Glucose 70 - 99 mg/dL 105(H) 105(H)  BUN 8 - 23 mg/dL 8 11  Creatinine 0.44 - 1.00 mg/dL 0.52 0.65  Sodium 135 - 145 mmol/L 141 142  Potassium 3.5 - 5.1 mmol/L 4.2 4.3  Chloride 98 - 111 mmol/L 103 105  CO2 22 - 32 mmol/L 28 29  Calcium 8.9 - 10.3 mg/dL 9.4 8.7(L)  Total Protein 6.5 - 8.1 g/dL 7.0 6.6  Total Bilirubin 0.3 - 1.2 mg/dL 0.4 0.4  Alkaline Phos 38 - 126 U/L 79 76  AST 15 - 41 U/L 17 13(L)  ALT 0 - 44 U/L 13 11    Lab Results  Component Value Date   WBC 6.9 08/17/2018   HGB 14.0 08/17/2018   HCT 45.6 08/17/2018   MCV 93.4 08/17/2018   PLT 173 08/17/2018   NEUTROABS 4.7 08/17/2018    ASSESSMENT & PLAN:  Malignant neoplasm of upper-outer quadrant of right breast in female, estrogen receptor positive ([place]) 07/13/2018:Right lumpectomy ([facility]): IDC with DCIS, 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   Pathology counseling: I discussed the final pathology report of the patient provided  a copy of this report. I discussed the margins as well as lymph node surgeries. We also discussed the final staging along with previously performed ER/PR and HER-2/neu testing.  Treatment plan: 1.  Adj chemo with Taxol-Herceptin weekly X 12 and then q 3 weeks Herceptin. 2.  Adjuvant radiation therapy 3.  Followed by adjuvant antiestrogen therapy  She had  an echocardiogram in November 2019.  EF was normal.  We will plan to do echocardiograms every 3 months from now. ------------------------------------------------------------------------------------------------------------------------------ Current treatment: Cycle 1 day 1 Taxol Herceptin Labs reviewed Chemo consent obtained Chemo education completed Return to clinic in 1 week for toxicity check    No orders of the defined types were placed in this encounter.  The patient has a good understanding of the overall plan. she agrees with it. she will call with any problems that may develop before the next visit here.  Nicholas Lose, MD 08/17/2018  Julious Oka Dorshimer am acting as scribe for Dr. Nicholas Lose.  I have reviewed the above documentation for accuracy and completeness, and I agree with the above.

## 2018-08-17 ENCOUNTER — Inpatient Hospital Stay: Payer: Medicare Other | Attending: Hematology and Oncology

## 2018-08-17 ENCOUNTER — Inpatient Hospital Stay: Payer: Medicare Other

## 2018-08-17 ENCOUNTER — Other Ambulatory Visit: Payer: Self-pay

## 2018-08-17 ENCOUNTER — Inpatient Hospital Stay (HOSPITAL_BASED_OUTPATIENT_CLINIC_OR_DEPARTMENT_OTHER): Payer: Medicare Other | Admitting: Hematology and Oncology

## 2018-08-17 ENCOUNTER — Telehealth: Payer: Self-pay | Admitting: *Deleted

## 2018-08-17 VITALS — BP 172/83 | HR 64 | Temp 97.6°F | Resp 20

## 2018-08-17 DIAGNOSIS — C50411 Malignant neoplasm of upper-outer quadrant of right female breast: Secondary | ICD-10-CM

## 2018-08-17 DIAGNOSIS — Z5112 Encounter for antineoplastic immunotherapy: Secondary | ICD-10-CM | POA: Insufficient documentation

## 2018-08-17 DIAGNOSIS — Z5111 Encounter for antineoplastic chemotherapy: Secondary | ICD-10-CM | POA: Diagnosis present

## 2018-08-17 DIAGNOSIS — Z95828 Presence of other vascular implants and grafts: Secondary | ICD-10-CM

## 2018-08-17 DIAGNOSIS — Z7901 Long term (current) use of anticoagulants: Secondary | ICD-10-CM | POA: Diagnosis not present

## 2018-08-17 DIAGNOSIS — Z170 Estrogen receptor positive status [ER+]: Secondary | ICD-10-CM | POA: Diagnosis not present

## 2018-08-17 DIAGNOSIS — I480 Paroxysmal atrial fibrillation: Secondary | ICD-10-CM

## 2018-08-17 LAB — CMP (CANCER CENTER ONLY)
ALT: 12 U/L (ref 0–44)
AST: 15 U/L (ref 15–41)
Albumin: 3.8 g/dL (ref 3.5–5.0)
Alkaline Phosphatase: 87 U/L (ref 38–126)
Anion gap: 10 (ref 5–15)
BUN: 12 mg/dL (ref 8–23)
CO2: 30 mmol/L (ref 22–32)
Calcium: 8.9 mg/dL (ref 8.9–10.3)
Chloride: 101 mmol/L (ref 98–111)
Creatinine: 0.66 mg/dL (ref 0.44–1.00)
GFR, Est AFR Am: >60 mL/min (ref >60)
GFR, Estimated: >60 mL/min (ref >60)
Glucose, Bld: 153 mg/dL — ABNORMAL HIGH (ref 70–99)
Potassium: 4.1 mmol/L (ref 3.5–5.1)
Sodium: 141 mmol/L (ref 135–145)
Total Bilirubin: 0.2 mg/dL — ABNORMAL LOW (ref 0.3–1.2)
Total Protein: 7.2 g/dL (ref 6.5–8.1)

## 2018-08-17 LAB — CBC WITH DIFFERENTIAL (CANCER CENTER ONLY)
Abs Immature Granulocytes: 0.02 10*3/uL (ref 0.00–0.07)
Basophils Absolute: 0.1 10*3/uL (ref 0.0–0.1)
Basophils Relative: 1 %
Eosinophils Absolute: 0.1 10*3/uL (ref 0.0–0.5)
Eosinophils Relative: 2 %
HCT: 45.6 % (ref 36.0–46.0)
Hemoglobin: 14 g/dL (ref 12.0–15.0)
Immature Granulocytes: 0 %
Lymphocytes Relative: 20 %
Lymphs Abs: 1.4 10*3/uL (ref 0.7–4.0)
MCH: 28.7 pg (ref 26.0–34.0)
MCHC: 30.7 g/dL (ref 30.0–36.0)
MCV: 93.4 fL (ref 80.0–100.0)
Monocytes Absolute: 0.6 10*3/uL (ref 0.1–1.0)
Monocytes Relative: 8 %
Neutro Abs: 4.7 10*3/uL (ref 1.7–7.7)
Neutrophils Relative %: 69 %
Platelet Count: 173 10*3/uL (ref 150–400)
RBC: 4.88 MIL/uL (ref 3.87–5.11)
RDW: 13 % (ref 11.5–15.5)
WBC Count: 6.9 10*3/uL (ref 4.0–10.5)
nRBC: 0 % (ref 0.0–0.2)

## 2018-08-17 LAB — PROTIME-INR
INR: 2.5 — ABNORMAL HIGH (ref 0.8–1.2)
Prothrombin Time: 26.2 seconds — ABNORMAL HIGH (ref 11.4–15.2)

## 2018-08-17 MED ORDER — SODIUM CHLORIDE 0.9% FLUSH
10.0000 mL | INTRAVENOUS | Status: DC | PRN
  Administered 2018-08-17: 10 mL via INTRAVENOUS
  Filled 2018-08-17: qty 10

## 2018-08-17 MED ORDER — SODIUM CHLORIDE 0.9 % IV SOLN: 20.0000 mg | Freq: Once | INTRAVENOUS | Status: DC

## 2018-08-17 MED ORDER — SODIUM CHLORIDE 0.9 % IV SOLN
Freq: Once | INTRAVENOUS | Status: AC
  Administered 2018-08-17: 11:00:00 via INTRAVENOUS
  Filled 2018-08-17: qty 250

## 2018-08-17 MED ORDER — SODIUM CHLORIDE 0.9% FLUSH
10.0000 mL | INTRAVENOUS | Status: DC | PRN
  Administered 2018-08-17: 16:00:00 10 mL
  Filled 2018-08-17: qty 10

## 2018-08-17 MED ORDER — LORAZEPAM 2 MG/ML IJ SOLN: 0.2500 mg | Freq: Once | INTRAMUSCULAR | Status: DC

## 2018-08-17 MED ORDER — FAMOTIDINE IN NACL 20-0.9 MG/50ML-% IV SOLN
INTRAVENOUS | Status: AC
  Filled 2018-08-17: qty 50

## 2018-08-17 MED ORDER — ACETAMINOPHEN 325 MG PO TABS
ORAL_TABLET | ORAL | Status: AC
  Filled 2018-08-17: qty 2

## 2018-08-17 MED ORDER — DIPHENHYDRAMINE HCL 50 MG/ML IJ SOLN
25.0000 mg | Freq: Once | INTRAMUSCULAR | Status: AC
  Administered 2018-08-17: 11:00:00 25 mg via INTRAVENOUS

## 2018-08-17 MED ORDER — DEXAMETHASONE SODIUM PHOSPHATE 10 MG/ML IJ SOLN
10.0000 mg | Freq: Once | INTRAMUSCULAR | Status: AC
  Administered 2018-08-17: 13:00:00 10 mg via INTRAVENOUS

## 2018-08-17 MED ORDER — ACETAMINOPHEN 325 MG PO TABS
650.0000 mg | ORAL_TABLET | Freq: Once | ORAL | Status: AC
  Administered 2018-08-17: 11:00:00 650 mg via ORAL

## 2018-08-17 MED ORDER — TRASTUZUMAB CHEMO 150 MG IV SOLR
300.0000 mg | Freq: Once | INTRAVENOUS | Status: AC
  Administered 2018-08-17: 12:00:00 300 mg via INTRAVENOUS
  Filled 2018-08-17: qty 14.29

## 2018-08-17 MED ORDER — SODIUM CHLORIDE 0.9 % IV SOLN
80.0000 mg/m2 | Freq: Once | INTRAVENOUS | Status: AC
  Administered 2018-08-17: 15:00:00 162 mg via INTRAVENOUS
  Filled 2018-08-17: qty 27

## 2018-08-17 MED ORDER — DIPHENHYDRAMINE HCL 50 MG/ML IJ SOLN
INTRAMUSCULAR | Status: AC
  Filled 2018-08-17: qty 1

## 2018-08-17 MED ORDER — HEPARIN SOD (PORK) LOCK FLUSH 100 UNIT/ML IV SOLN
500.0000 [IU] | Freq: Once | INTRAVENOUS | Status: AC | PRN
  Administered 2018-08-17: 16:00:00 500 [IU]
  Filled 2018-08-17: qty 5

## 2018-08-17 MED ORDER — FAMOTIDINE IN NACL 20-0.9 MG/50ML-% IV SOLN
20.0000 mg | Freq: Once | INTRAVENOUS | Status: AC
  Administered 2018-08-17: 14:00:00 20 mg via INTRAVENOUS

## 2018-08-17 NOTE — Patient Instructions (Signed)

## 2018-08-17 NOTE — Progress Notes (Signed)
Education packet given to pt earlier after MD appt & reviewed & questions answered.

## 2018-08-17 NOTE — Patient Instructions (Addendum)
[facility] Discharge Instructions for Patients Receiving Chemotherapy  Today you received the following chemotherapy agents Herceptin, Taxol  To help prevent nausea and vomiting after your treatment, we encourage you to take your nausea medication: As directed by MD   If you develop nausea and vomiting that is not controlled by your nausea medication, call the clinic.   BELOW ARE SYMPTOMS THAT SHOULD BE REPORTED IMMEDIATELY:  *FEVER GREATER THAN 100.5 F  *CHILLS WITH OR WITHOUT FEVER  NAUSEA AND VOMITING THAT IS NOT CONTROLLED WITH YOUR NAUSEA MEDICATION  *UNUSUAL SHORTNESS OF BREATH  *UNUSUAL BRUISING OR BLEEDING  TENDERNESS IN MOUTH AND THROAT WITH OR WITHOUT PRESENCE OF ULCERS  *URINARY PROBLEMS  *BOWEL PROBLEMS  UNUSUAL RASH Items with * indicate a potential emergency and should be followed up as soon as possible.  Feel free to call the clinic should you have any questions or concerns. The clinic phone number is [phone].  Please show the [facility] at check-in to the Emergency Department and triage nurse.  Trastuzumab injection for infusion What is this medicine? TRASTUZUMAB (tras TOO zoo mab) is a monoclonal antibody. It is used to treat breast cancer and stomach cancer. This medicine may be used for other purposes; ask your health care provider or pharmacist if you have questions. COMMON BRAND NAME(S): Herceptin, Calla Kicks, OGIVRI What should I tell my health care provider before I take this medicine? They need to know if you have any of these conditions: -heart disease -heart failure -lung or breathing disease, like asthma -an unusual or allergic reaction to trastuzumab, benzyl alcohol, or other medications, foods, dyes, or preservatives -pregnant or trying to get pregnant -breast-feeding How should I use this medicine? This drug is given as an infusion into a vein. It is administered in a hospital or clinic by a specially trained  health care professional. Talk to your pediatrician regarding the use of this medicine in children. This medicine is not approved for use in children. Overdosage: If you think you have taken too much of this medicine contact a poison control center or emergency room at once. NOTE: This medicine is only for you. Do not share this medicine with others. What if I miss a dose? It is important not to miss a dose. Call your doctor or health care professional if you are unable to keep an appointment. What may interact with this medicine? This medicine may interact with the following medications: -certain types of chemotherapy, such as daunorubicin, doxorubicin, epirubicin, and idarubicin This list may not describe all possible interactions. Give your health care provider a list of all the medicines, herbs, non-prescription drugs, or dietary supplements you use. Also tell them if you smoke, drink alcohol, or use illegal drugs. Some items may interact with your medicine. What should I watch for while using this medicine? Visit your doctor for checks on your progress. Report any side effects. Continue your course of treatment even though you feel ill unless your doctor tells you to stop. Call your doctor or health care professional for advice if you get a fever, chills or sore throat, or other symptoms of a cold or flu. Do not treat yourself. Try to avoid being around people who are sick. You may experience fever, chills and shaking during your first infusion. These effects are usually mild and can be treated with other medicines. Report any side effects during the infusion to your health care professional. Fever and chills usually do not happen with later infusions.  Do not become pregnant while taking this medicine or for 7 months after stopping it. Women should inform their doctor if they wish to become pregnant or think they might be pregnant. Women of child-bearing potential will need to have a negative  pregnancy test before starting this medicine. There is a potential for serious side effects to an unborn child. Talk to your health care professional or pharmacist for more information. Do not breast-feed an infant while taking this medicine or for 7 months after stopping it. Women must use effective birth control with this medicine. What side effects may I notice from receiving this medicine? Side effects that you should report to your doctor or health care professional as soon as possible: -allergic reactions like skin rash, itching or hives, swelling of the face, lips, or tongue -chest pain or palpitations -cough -dizziness -feeling faint or lightheaded, falls -fever -general ill feeling or flu-like symptoms -signs of worsening heart failure like breathing problems; swelling in your legs and feet -unusually weak or tired Side effects that usually do not require medical attention (report to your doctor or health care professional if they continue or are bothersome): -bone pain -changes in taste -diarrhea -joint pain -nausea/vomiting -weight loss This list may not describe all possible side effects. Call your doctor for medical advice about side effects. You may report side effects to FDA at 1-800-FDA-1088. Where should I keep my medicine? This drug is given in a hospital or clinic and will not be stored at home. NOTE: This sheet is a summary. It may not cover all possible information. If you have questions about this medicine, talk to your doctor, pharmacist, or health care provider.  2019 Elsevier/Gold Standard (2016-03-23 14:37:52)  Paclitaxel injection What is this medicine? PACLITAXEL (PAK li TAX el) is a chemotherapy drug. It targets fast dividing cells, like cancer cells, and causes these cells to die. This medicine is used to treat ovarian cancer, breast cancer, lung cancer, Kaposi's sarcoma, and other cancers. This medicine may be used for other purposes; ask your health care  provider or pharmacist if you have questions. COMMON BRAND NAME(S): Onxol, Taxol What should I tell my health care provider before I take this medicine? They need to know if you have any of these conditions: -history of irregular heartbeat -liver disease -low blood counts, like low white cell, platelet, or red cell counts -lung or breathing disease, like asthma -tingling of the fingers or toes, or other nerve disorder -an unusual or allergic reaction to paclitaxel, alcohol, polyoxyethylated castor oil, other chemotherapy, other medicines, foods, dyes, or preservatives -pregnant or trying to get pregnant -breast-feeding How should I use this medicine? This drug is given as an infusion into a vein. It is administered in a hospital or clinic by a specially trained health care professional. Talk to your pediatrician regarding the use of this medicine in children. Special care may be needed. Overdosage: If you think you have taken too much of this medicine contact a poison control center or emergency room at once. NOTE: This medicine is only for you. Do not share this medicine with others. What if I miss a dose? It is important not to miss your dose. Call your doctor or health care professional if you are unable to keep an appointment. What may interact with this medicine? Do not take this medicine with any of the following medications: -disulfiram -metronidazole This medicine may also interact with the following medications: -antiviral medicines for hepatitis, HIV or AIDS -certain antibiotics like  erythromycin and clarithromycin -certain medicines for fungal infections like ketoconazole and itraconazole -certain medicines for seizures like carbamazepine, phenobarbital, phenytoin -gemfibrozil -nefazodone -rifampin -St. John's wort This list may not describe all possible interactions. Give your health care provider a list of all the medicines, herbs, non-prescription drugs, or dietary  supplements you use. Also tell them if you smoke, drink alcohol, or use illegal drugs. Some items may interact with your medicine. What should I watch for while using this medicine? Your condition will be monitored carefully while you are receiving this medicine. You will need important blood work done while you are taking this medicine. This medicine can cause serious allergic reactions. To reduce your risk you will need to take other medicine(s) before treatment with this medicine. If you experience allergic reactions like skin rash, itching or hives, swelling of the face, lips, or tongue, tell your doctor or health care professional right away. In some cases, you may be given additional medicines to help with side effects. Follow all directions for their use. This drug may make you feel generally unwell. This is not uncommon, as chemotherapy can affect healthy cells as well as cancer cells. Report any side effects. Continue your course of treatment even though you feel ill unless your doctor tells you to stop. Call your doctor or health care professional for advice if you get a fever, chills or sore throat, or other symptoms of a cold or flu. Do not treat yourself. This drug decreases your body's ability to fight infections. Try to avoid being around people who are sick. This medicine may increase your risk to bruise or bleed. Call your doctor or health care professional if you notice any unusual bleeding. Be careful brushing and flossing your teeth or using a toothpick because you may get an infection or bleed more easily. If you have any dental work done, tell your dentist you are receiving this medicine. Avoid taking products that contain aspirin, acetaminophen, ibuprofen, naproxen, or ketoprofen unless instructed by your doctor. These medicines may hide a fever. Do not become pregnant while taking this medicine. Women should inform their doctor if they wish to become pregnant or think they might be  pregnant. There is a potential for serious side effects to an unborn child. Talk to your health care professional or pharmacist for more information. Do not breast-feed an infant while taking this medicine. Men are advised not to father a child while receiving this medicine. This product may contain alcohol. Ask your pharmacist or healthcare provider if this medicine contains alcohol. Be sure to tell all healthcare providers you are taking this medicine. Certain medicines, like metronidazole and disulfiram, can cause an unpleasant reaction when taken with alcohol. The reaction includes flushing, headache, nausea, vomiting, sweating, and increased thirst. The reaction can last from 30 minutes to several hours. What side effects may I notice from receiving this medicine? Side effects that you should report to your doctor or health care professional as soon as possible: -allergic reactions like skin rash, itching or hives, swelling of the face, lips, or tongue -breathing problems -changes in vision -fast, irregular heartbeat -high or low blood pressure -mouth sores -pain, tingling, numbness in the hands or feet -signs of decreased platelets or bleeding - bruising, pinpoint red spots on the skin, black, tarry stools, blood in the urine -signs of decreased red blood cells - unusually weak or tired, feeling faint or lightheaded, falls -signs of infection - fever or chills, cough, sore throat, pain or difficulty passing  urine -signs and symptoms of liver injury like dark yellow or brown urine; general ill feeling or flu-like symptoms; light-colored stools; loss of appetite; nausea; right upper belly pain; unusually weak or tired; yellowing of the eyes or skin -swelling of the ankles, feet, hands -unusually slow heartbeat Side effects that usually do not require medical attention (report to your doctor or health care professional if they continue or are bothersome): -diarrhea -hair loss -loss of  appetite -muscle or joint pain -nausea, vomiting -pain, redness, or irritation at site where injected -tiredness This list may not describe all possible side effects. Call your doctor for medical advice about side effects. You may report side effects to FDA at 1-800-FDA-1088. Where should I keep my medicine? This drug is given in a hospital or clinic and will not be stored at home. NOTE: This sheet is a summary. It may not cover all possible information. If you have questions about this medicine, talk to your doctor, pharmacist, or health care provider.  2019 Elsevier/Gold Standard (2016-11-30 13:14:55)  Coronavirus (COVID-19) Are you at risk?  Are you at risk for the Coronavirus (COVID-19)?  To be considered HIGH RISK for Coronavirus (COVID-19), you have to meet the following criteria:  . Traveled to [place], [place], [place], [place] or [place]; or in the [place] to [place], [place], [place], or [place]; and have fever, cough, and shortness of breath within the last 2 weeks of travel OR . Been in close contact with a person diagnosed with COVID-19 within the last 2 weeks and have fever, cough, and shortness of breath . IF YOU DO NOT MEET THESE CRITERIA, YOU ARE CONSIDERED LOW RISK FOR COVID-19.  What to do if you are HIGH RISK for COVID-19?  Marland Kitchen If you are having a medical emergency, call 911. . Seek medical care right away. Before you go to a doctor's office, urgent care or emergency department, call ahead and tell them about your recent travel, contact with someone diagnosed with COVID-19, and your symptoms. You should receive instructions from your physician's office regarding next steps of care.  . When you arrive at healthcare provider, tell the healthcare staff immediately you have returned from visiting [place], [place], [place], [place] or [place]; or traveled in the [place] to [place], [place], [place], or [place]; in the last two weeks or you have been in  close contact with a person diagnosed with COVID-19 in the last 2 weeks.   . Tell the health care staff about your symptoms: fever, cough and shortness of breath. . After you have been seen by a medical provider, you will be either: o Tested for (COVID-19) and discharged home on quarantine except to seek medical care if symptoms worsen, and asked to  - Stay home and avoid contact with others until you get your results (4-5 days)  - Avoid travel on public transportation if possible (such as bus, train, or airplane) or o Sent to the Emergency Department by EMS for evaluation, COVID-19 testing, and possible admission depending on your condition and test results.  What to do if you are LOW RISK for COVID-19?  Reduce your risk of any infection by using the same precautions used for avoiding the common cold or flu:  Marland Kitchen Wash your hands often with soap and warm water for at least 20 seconds.  If soap and water are not readily available, use an alcohol-based hand sanitizer with at least 60% alcohol.  . If coughing or  sneezing, cover your mouth and nose by coughing or sneezing into the elbow areas of your shirt or coat, into a tissue or into your sleeve (not your hands). . Avoid shaking hands with others and consider head nods or verbal greetings only. . Avoid touching your eyes, nose, or mouth with unwashed hands.  . Avoid close contact with people who are sick. . Avoid places or events with large numbers of people in one location, like concerts or sporting events. . Carefully consider travel plans you have or are making. . If you are planning any travel outside or inside the [place], visit the CDC's Travelers' Health webpage for the latest health notices. . If you have some symptoms but not all symptoms, continue to monitor at home and seek medical attention if your symptoms worsen. . If you are having a medical emergency, call 911.   [place] /  e-Visit: [email or web address]         [facility]: [facility]: [phone]                   [facility]: [phone]

## 2018-08-17 NOTE — Telephone Encounter (Signed)
Left vm to assess needs/answer any questions regarding tx plan. Contact information provided

## 2018-08-17 NOTE — Progress Notes (Signed)
Pt. first time Herceptin/Taxol treatment BP at Taxol start 157/68 now 177/96. Pt. denies complaints of chest pain, dizziness, no shortness of breath, asymptomatic. Pt. denies being anxious, but states it has been "hard" not to have a cigarette during this time. Pt. Smoked prior to getting here and hasn't had one since.  MD notified, new order received.

## 2018-08-18 ENCOUNTER — Telehealth: Payer: Self-pay | Admitting: *Deleted

## 2018-08-18 NOTE — Telephone Encounter (Signed)
Chemo follow up call- pt having no issues-, and no additional questions- understands to call if needed.

## 2018-08-18 NOTE — Telephone Encounter (Signed)
-----   Message from Lester [place], RN sent at 08/17/2018  4:21 PM EDT ----- Regarding: Dr. Fredric Dine; First time follow up Pt. received first time Herceptin,Taxol, tolerated well. Slight increase in blood pressure, but asymptomatic. Pt. Current smoker had somewhat difficult time without having a cigarette.

## 2018-08-19 LAB — PT FACTOR INHIBITOR (MIXING STUDY)
1 HR INCUB PT 1:1NP: 12.2 s — ABNORMAL HIGH (ref 9.6–11.5)
PT 1:1NP: 12.3 s — ABNORMAL HIGH (ref 9.6–11.5)
PT: 27.8 s — ABNORMAL HIGH (ref 9.6–11.5)

## 2018-08-21 ENCOUNTER — Ambulatory Visit (INDEPENDENT_AMBULATORY_CARE_PROVIDER_SITE_OTHER): Payer: Medicare Other | Admitting: Pharmacist

## 2018-08-21 DIAGNOSIS — Z5181 Encounter for therapeutic drug level monitoring: Secondary | ICD-10-CM | POA: Diagnosis not present

## 2018-08-21 DIAGNOSIS — I480 Paroxysmal atrial fibrillation: Secondary | ICD-10-CM

## 2018-08-21 DIAGNOSIS — Z7901 Long term (current) use of anticoagulants: Secondary | ICD-10-CM

## 2018-08-21 NOTE — Assessment & Plan Note (Signed)
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 Treatment plan: 1.Adj chemo with Taxol-Herceptin weekly X 12 and then q 3 weeks Herceptin. 2.Adjuvant radiation therapy 3.Followed by adjuvant antiestrogen therapy ----------------------------------------------------------------------------------------------------------------------------------------------- Current treatment: Cycle 2 Taxol Herceptin Chemo toxicities:  Labs reviewed Closely monitoring for chemo toxicities Return to clinic weekly for Taxol Herceptin's and every other week for follow-up with me.

## 2018-08-23 NOTE — Progress Notes (Signed)
Patient Care Team: Enid Skeens., MD as PCP - General (Family Medicine) Mauro Kaufmann, RN as Oncology Nurse Navigator Rockwell Germany, RN as Oncology Nurse Navigator Erroll Luna, MD as Consulting Physician (General Surgery) Nicholas Lose, MD as Consulting Physician (Hematology and Oncology) Eppie Gibson, MD as Attending Physician (Radiation Oncology)  DIAGNOSIS:    ICD-10-CM   1. Malignant neoplasm of upper-outer quadrant of right breast in female, estrogen receptor positive ([place]) C50.411    Z17.0     SUMMARY OF ONCOLOGIC HISTORY:   Malignant neoplasm of upper-outer quadrant of right breast in female, estrogen receptor positive ([place])   06/19/2018 Initial Diagnosis    Screening mammogram detected right breast mass at 9 o'clock position 3 cm from the nipple, ultrasound revealed 7 x 5 x 5 mm mass, axillary ultrasound normal-appearing lymph nodes, ultrasound biopsy: Grade 1 IDC ER 100%, PR 10%, Ki-67 10%, HER-2 3+ positive, T1b N0 stage Ia    06/28/2018 Cancer Staging    Staging form: Breast, AJCC 8th Edition - Clinical stage from 06/28/2018: Stage IA (cT1b, cN0, cM0, G1, ER+, PR+, HER2+) - Signed by Nicholas Lose, MD on 06/28/2018    07/05/2018 Genetic Testing    Negative genetic testing on the common hereditary cancer panel.  The Hereditary Gene Panel offered by [facility] includes sequencing and/or deletion duplication testing of the following 48 genes: APC, ATM, AXIN2, BARD1, BMPR1A, BRCA1, BRCA2, BRIP1, CDH1, CDK4, CDKN2A (p14ARF), CDKN2A (p16INK4a), CHEK2, CTNNA1, DICER1, EPCAM (Deletion/duplication testing only), GREM1 (promoter region deletion/duplication testing only), KIT, MEN1, MLH1, MSH2, MSH3, MSH6, MUTYH, NBN, NF1, NHTL1, PALB2, PDGFRA, PMS2, POLD1, POLE, PTEN, RAD50, RAD51C, RAD51D, RNF43, SDHB, SDHC, SDHD, SMAD4, SMARCA4. STK11, TP53, TSC1, TSC2, and VHL.  The following genes were evaluated for sequence changes only: SDHA and HOXB13 c.251G>A variant only. The report date  is July 05, 2018.    07/13/2018 Surgery    Right lumpectomy ([facility]): IDC with DCIS, 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     08/17/2018 -  Chemotherapy    The patient had trastuzumab (HERCEPTIN) 300 mg in sodium chloride 0.9 % 250 mL chemo infusion, 315 mg, Intravenous,  Once, 1 of 16 cycles Administration: 300 mg (08/17/2018) PACLitaxel (TAXOL) 162 mg in sodium chloride 0.9 % 250 mL chemo infusion (</= 68m/m2), 80 mg/m2 = 162 mg, Intravenous,  Once, 1 of 3 cycles Administration: 162 mg (08/17/2018)  for chemotherapy treatment.      CHIEF COMPLIANT: Cycle 2 Taxol and Herceptin  INTERVAL HISTORY: DMicki [name] a 68y.o. with above-mentioned history of right breast cancer who underwent a lumpectomy and is currently on adjuvant chemotherapy with weekly Taxol and Herceptin. She presents to the clinic today for treatment and a toxicity check.   REVIEW OF SYSTEMS:   Constitutional: Denies fevers, chills or abnormal weight loss Eyes: Denies blurriness of vision Ears, nose, mouth, throat, and face: Denies mucositis or sore throat Respiratory: Denies cough, dyspnea or wheezes Cardiovascular: Denies palpitation, chest discomfort Gastrointestinal: Denies nausea, heartburn or change in bowel habits Skin: Denies abnormal skin rashes Lymphatics: Denies new lymphadenopathy or easy bruising Neurological: Denies numbness, tingling or new weaknesses Behavioral/Psych: Mood is stable, no new changes  Extremities: No lower extremity edema Breast: denies any pain or lumps or nodules in either breasts All other systems were reviewed with the patient and are negative.  I have reviewed the past medical history, past surgical history, social history and family history with  the patient and they are unchanged from previous note.  ALLERGIES:  is allergic to atorvastatin and meperidine.  MEDICATIONS:  Current Outpatient Medications  Medication Sig Dispense  Refill  . digoxin (LANOXIN) 0.125 MG tablet TAKE 1 TABLET DAILY 90 tablet 1  . escitalopram (LEXAPRO) 10 MG tablet Take 1 tablet by mouth daily.    . flecainide (TAMBOCOR) 100 MG tablet Take 1 tablet (100 mg total) by mouth 2 (two) times daily. 180 tablet 3  . ibuprofen (ADVIL,MOTRIN) 800 MG tablet Take 1 tablet (800 mg total) by mouth every 8 (eight) hours as needed. 30 tablet 0  . levothyroxine (SYNTHROID, LEVOTHROID) 100 MCG tablet Take 1 tablet by mouth daily.    Marland Kitchen lidocaine-prilocaine (EMLA) cream Apply to affected area once 30 g 3  . lisinopril (PRINIVIL,ZESTRIL) 10 MG tablet Take 1 tablet (10 mg total) by mouth daily. 90 tablet 3  . metFORMIN (GLUCOPHAGE) 500 MG tablet Take 1 tablet by mouth 2 (two) times daily with a meal.     . metoprolol succinate (TOPROL-XL) 50 MG 24 hr tablet Take 1 tablet (50 mg total) by mouth daily. 90 tablet 3  . ondansetron (ZOFRAN) 8 MG tablet Take 1 tablet (8 mg total) by mouth 2 (two) times daily as needed (Nausea or vomiting). 30 tablet 1  . prochlorperazine (COMPAZINE) 10 MG tablet Take 1 tablet (10 mg total) by mouth every 6 (six) hours as needed (Nausea or vomiting). 30 tablet 1  . warfarin (COUMADIN) 10 MG tablet TAKE AS DIRECTED BY COUMADIN CLINIC 30 tablet 0   No current facility-administered medications for this visit.     PHYSICAL EXAMINATION: ECOG PERFORMANCE STATUS: 1 - Symptomatic but completely ambulatory  Vitals:   08/24/18 1306  BP: (!) 161/69  Pulse: 66  Resp: 17  Temp: 99.2 F (37.3 C)  SpO2: 94%   Filed Weights   08/24/18 1306  Weight: 181 lb 12.8 oz (82.5 kg)    GENERAL: alert, no distress and comfortable SKIN: skin color, texture, turgor are normal, no rashes or significant lesions EYES: normal, Conjunctiva are pink and non-injected, sclera clear OROPHARYNX: no exudate, no erythema and lips, buccal mucosa, and tongue normal  NECK: supple, thyroid normal size, non-tender, without nodularity LYMPH: no palpable  lymphadenopathy in the cervical, axillary or inguinal LUNGS: clear to auscultation and percussion with normal breathing effort HEART: regular rate & rhythm and no murmurs and no lower extremity edema ABDOMEN: abdomen soft, non-tender and normal bowel sounds MUSCULOSKELETAL: no cyanosis of digits and no clubbing  NEURO: alert & oriented x 3 with fluent speech, no focal motor/sensory deficits EXTREMITIES: No lower extremity edema  LABORATORY DATA:  I have reviewed the data as listed CMP Latest Ref Rng & Units 08/17/2018 08/01/2018 06/28/2018  Glucose 70 - 99 mg/dL 153(H) 105(H) 105(H)  BUN 8 - 23 mg/dL _0 Creatinine 0.44 - 1.00 mg/dL 0.66 0.52 0.65  Sodium 135 - 145 mmol/L 141 141 142  Potassium 3.5 - 5.1 mmol/L 4.1 4.2 4.3  Chloride 98 - 111 mmol/L 101 103 105  CO2 22 - 32 mmol/L _1 Calcium 8.9 - 10.3 mg/dL 8.9 9.4 8.7(L)  Total Protein 6.5 - 8.1 g/dL 7.2 7.0 6.6  Total Bilirubin 0.3 - 1.2 mg/dL 0.2(L) 0.4 0.4  Alkaline Phos 38 - 126 U/L 87 79 76  AST 15 - 41 U/L 15 17 13(L)  ALT 0 - 44 U/L _2 Lab Results  Component Value  Date   WBC 6.9 08/17/2018   HGB 14.0 08/17/2018   HCT 45.6 08/17/2018   MCV 93.4 08/17/2018   PLT 173 08/17/2018   NEUTROABS 4.7 08/17/2018    ASSESSMENT & PLAN:  Malignant neoplasm of upper-outer quadrant of right breast in female, estrogen receptor positive ([place]) 07/13/2018:Right lumpectomy ([facility]): IDC with DCIS, 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 Treatment plan: 1.Adj chemo with Taxol-Herceptin weekly X 12 and then q 3 weeks Herceptin. 2.Adjuvant radiation therapy 3.Followed by adjuvant antiestrogen therapy ----------------------------------------------------------------------------------------------------------------------------------------------- Current treatment: Cycle 2 Taxol Herceptin Chemo toxicities: Denies any side effects from chemotherapy.  Labs reviewed  Closely monitoring for chemo toxicities Return to clinic weekly for Taxol Herceptin's and every other week for follow-up with me.   No orders of the defined types were placed in this encounter.  The patient has a good understanding of the overall plan. she agrees with it. she will call with any problems that may develop before the next visit here.  Nicholas Lose, MD 08/24/2018  Julious Oka Dorshimer am acting as scribe for Dr. Nicholas Lose.  I have reviewed the above documentation for accuracy and completeness, and I agree with the above.

## 2018-08-24 ENCOUNTER — Other Ambulatory Visit: Payer: Self-pay

## 2018-08-24 ENCOUNTER — Telehealth: Payer: Self-pay | Admitting: *Deleted

## 2018-08-24 ENCOUNTER — Other Ambulatory Visit: Payer: Medicare Other

## 2018-08-24 ENCOUNTER — Inpatient Hospital Stay (HOSPITAL_BASED_OUTPATIENT_CLINIC_OR_DEPARTMENT_OTHER): Payer: Medicare Other | Admitting: Hematology and Oncology

## 2018-08-24 ENCOUNTER — Inpatient Hospital Stay: Payer: Medicare Other

## 2018-08-24 VITALS — BP 153/77 | HR 57

## 2018-08-24 DIAGNOSIS — C50411 Malignant neoplasm of upper-outer quadrant of right female breast: Secondary | ICD-10-CM

## 2018-08-24 DIAGNOSIS — Z170 Estrogen receptor positive status [ER+]: Secondary | ICD-10-CM

## 2018-08-24 DIAGNOSIS — Z5112 Encounter for antineoplastic immunotherapy: Secondary | ICD-10-CM | POA: Diagnosis not present

## 2018-08-24 DIAGNOSIS — Z95828 Presence of other vascular implants and grafts: Secondary | ICD-10-CM

## 2018-08-24 HISTORY — DX: Presence of other vascular implants and grafts: Z95.828

## 2018-08-24 LAB — CBC WITH DIFFERENTIAL (CANCER CENTER ONLY)
Abs Immature Granulocytes: 0.02 10*3/uL (ref 0.00–0.07)
Basophils Absolute: 0 10*3/uL (ref 0.0–0.1)
Basophils Relative: 1 %
Eosinophils Absolute: 0.1 10*3/uL (ref 0.0–0.5)
Eosinophils Relative: 2 %
HCT: 41 % (ref 36.0–46.0)
Hemoglobin: 12.6 g/dL (ref 12.0–15.0)
Immature Granulocytes: 0 %
Lymphocytes Relative: 29 %
Lymphs Abs: 1.8 10*3/uL (ref 0.7–4.0)
MCH: 28.4 pg (ref 26.0–34.0)
MCHC: 30.7 g/dL (ref 30.0–36.0)
MCV: 92.3 fL (ref 80.0–100.0)
Monocytes Absolute: 0.4 10*3/uL (ref 0.1–1.0)
Monocytes Relative: 6 %
Neutro Abs: 3.9 10*3/uL (ref 1.7–7.7)
Neutrophils Relative %: 62 %
Platelet Count: 183 10*3/uL (ref 150–400)
RBC: 4.44 MIL/uL (ref 3.87–5.11)
RDW: 12.9 % (ref 11.5–15.5)
WBC Count: 6.3 10*3/uL (ref 4.0–10.5)
nRBC: 0 % (ref 0.0–0.2)

## 2018-08-24 LAB — CMP (CANCER CENTER ONLY)
ALT: 13 U/L (ref 0–44)
AST: 14 U/L — ABNORMAL LOW (ref 15–41)
Albumin: 3.8 g/dL (ref 3.5–5.0)
Alkaline Phosphatase: 81 U/L (ref 38–126)
Anion gap: 7 (ref 5–15)
BUN: 13 mg/dL (ref 8–23)
CO2: 29 mmol/L (ref 22–32)
Calcium: 8.9 mg/dL (ref 8.9–10.3)
Chloride: 103 mmol/L (ref 98–111)
Creatinine: 0.65 mg/dL (ref 0.44–1.00)
GFR, Est AFR Am: >60 mL/min (ref >60)
GFR, Estimated: >60 mL/min (ref >60)
Glucose, Bld: 113 mg/dL — ABNORMAL HIGH (ref 70–99)
Potassium: 4.3 mmol/L (ref 3.5–5.1)
Sodium: 139 mmol/L (ref 135–145)
Total Bilirubin: 0.3 mg/dL (ref 0.3–1.2)
Total Protein: 6.7 g/dL (ref 6.5–8.1)

## 2018-08-24 MED ORDER — TRASTUZUMAB CHEMO 150 MG IV SOLR
150.0000 mg | Freq: Once | INTRAVENOUS | Status: AC
  Administered 2018-08-24: 150 mg via INTRAVENOUS
  Filled 2018-08-24: qty 7.14

## 2018-08-24 MED ORDER — HEPARIN SOD (PORK) LOCK FLUSH 100 UNIT/ML IV SOLN
500.0000 [IU] | Freq: Once | INTRAVENOUS | Status: DC | PRN
  Filled 2018-08-24: qty 5

## 2018-08-24 MED ORDER — SODIUM CHLORIDE 0.9% FLUSH
10.0000 mL | INTRAVENOUS | Status: DC | PRN
  Filled 2018-08-24: qty 10

## 2018-08-24 MED ORDER — FAMOTIDINE IN NACL 20-0.9 MG/50ML-% IV SOLN
20.0000 mg | Freq: Once | INTRAVENOUS | Status: AC
  Administered 2018-08-24: 15:00:00 20 mg via INTRAVENOUS

## 2018-08-24 MED ORDER — DIPHENHYDRAMINE HCL 50 MG/ML IJ SOLN
INTRAMUSCULAR | Status: AC
  Filled 2018-08-24: qty 1

## 2018-08-24 MED ORDER — SODIUM CHLORIDE 0.9 % IV SOLN
Freq: Once | INTRAVENOUS | Status: AC
  Administered 2018-08-24: 15:00:00 via INTRAVENOUS
  Filled 2018-08-24: qty 250

## 2018-08-24 MED ORDER — DEXAMETHASONE SODIUM PHOSPHATE 10 MG/ML IJ SOLN
10.0000 mg | Freq: Once | INTRAMUSCULAR | Status: AC
  Administered 2018-08-24: 10 mg via INTRAVENOUS

## 2018-08-24 MED ORDER — ACETAMINOPHEN 325 MG PO TABS
650.0000 mg | ORAL_TABLET | Freq: Once | ORAL | Status: AC
  Administered 2018-08-24: 15:00:00 650 mg via ORAL

## 2018-08-24 MED ORDER — DEXAMETHASONE SODIUM PHOSPHATE 10 MG/ML IJ SOLN
INTRAMUSCULAR | Status: AC
  Filled 2018-08-24: qty 1

## 2018-08-24 MED ORDER — SODIUM CHLORIDE 0.9 % IV SOLN
80.0000 mg/m2 | Freq: Once | INTRAVENOUS | Status: AC
  Administered 2018-08-24: 16:00:00 162 mg via INTRAVENOUS
  Filled 2018-08-24: qty 27

## 2018-08-24 MED ORDER — SODIUM CHLORIDE 0.9% FLUSH
10.0000 mL | Freq: Once | INTRAVENOUS | Status: AC
  Administered 2018-08-24: 10 mL
  Filled 2018-08-24: qty 10

## 2018-08-24 MED ORDER — FAMOTIDINE IN NACL 20-0.9 MG/50ML-% IV SOLN
INTRAVENOUS | Status: AC
  Filled 2018-08-24: qty 50

## 2018-08-24 MED ORDER — ACETAMINOPHEN 325 MG PO TABS
ORAL_TABLET | ORAL | Status: AC
  Filled 2018-08-24: qty 2

## 2018-08-24 MED ORDER — DIPHENHYDRAMINE HCL 50 MG/ML IJ SOLN
25.0000 mg | Freq: Once | INTRAMUSCULAR | Status: AC
  Administered 2018-08-24: 25 mg via INTRAVENOUS

## 2018-08-24 MED ORDER — TRASTUZUMAB CHEMO 150 MG IV SOLR: 2.0000 mg/kg | Freq: Once | INTRAVENOUS | Status: DC

## 2018-08-24 NOTE — Progress Notes (Signed)
Sabrina [name] is being scheduled for Friday 09/01/2018 with her cardiologist.

## 2018-08-24 NOTE — Telephone Encounter (Signed)
Left vm to assess needs during chemo. Contact information for needs or questions.

## 2018-08-24 NOTE — Patient Instructions (Signed)
[facility] Discharge Instructions for Patients Receiving Chemotherapy  Today you received the following chemotherapy agents Herceptin, Taxol  To help prevent nausea and vomiting after your treatment, we encourage you to take your nausea medication: As directed by MD   If you develop nausea and vomiting that is not controlled by your nausea medication, call the clinic.   BELOW ARE SYMPTOMS THAT SHOULD BE REPORTED IMMEDIATELY:  *FEVER GREATER THAN 100.5 F  *CHILLS WITH OR WITHOUT FEVER  NAUSEA AND VOMITING THAT IS NOT CONTROLLED WITH YOUR NAUSEA MEDICATION  *UNUSUAL SHORTNESS OF BREATH  *UNUSUAL BRUISING OR BLEEDING  TENDERNESS IN MOUTH AND THROAT WITH OR WITHOUT PRESENCE OF ULCERS  *URINARY PROBLEMS  *BOWEL PROBLEMS  UNUSUAL RASH Items with * indicate a potential emergency and should be followed up as soon as possible.  Feel free to call the clinic should you have any questions or concerns. The clinic phone number is [phone].  Please show the [facility] at check-in to the Emergency Department and triage nurse.   Coronavirus (COVID-19) Are you at risk?  Are you at risk for the Coronavirus (COVID-19)?  To be considered HIGH RISK for Coronavirus (COVID-19), you have to meet the following criteria:  . Traveled to [place], [place], [place], [place] or [place]; or in the [place] to [place], [place], [place], or [place]; and have fever, cough, and shortness of breath within the last 2 weeks of travel OR . Been in close contact with a person diagnosed with COVID-19 within the last 2 weeks and have fever, cough, and shortness of breath . IF YOU DO NOT MEET THESE CRITERIA, YOU ARE CONSIDERED LOW RISK FOR COVID-19.  What to do if you are HIGH RISK for COVID-19?  Marland Kitchen If you are having a medical emergency, call 911. . Seek medical care right away. Before you go to a doctor's office, urgent care or emergency department, call ahead and tell them  about your recent travel, contact with someone diagnosed with COVID-19, and your symptoms. You should receive instructions from your physician's office regarding next steps of care.  . When you arrive at healthcare provider, tell the healthcare staff immediately you have returned from visiting [place], [place], [place], [place] or [place]; or traveled in the [place] to [place], [place], [place], or [place]; in the last two weeks or you have been in close contact with a person diagnosed with COVID-19 in the last 2 weeks.   . Tell the health care staff about your symptoms: fever, cough and shortness of breath. . After you have been seen by a medical provider, you will be either: o Tested for (COVID-19) and discharged home on quarantine except to seek medical care if symptoms worsen, and asked to  - Stay home and avoid contact with others until you get your results (4-5 days)  - Avoid travel on public transportation if possible (such as bus, train, or airplane) or o Sent to the Emergency Department by EMS for evaluation, COVID-19 testing, and possible admission depending on your condition and test results.  What to do if you are LOW RISK for COVID-19?  Reduce your risk of any infection by using the same precautions used for avoiding the common cold or flu:  Marland Kitchen Wash your hands often with soap and warm water for at least 20 seconds.  If soap and water are not readily available, use an alcohol-based hand sanitizer with at least 60% alcohol.  Marland Kitchen  If coughing or sneezing, cover your mouth and nose by coughing or sneezing into the elbow areas of your shirt or coat, into a tissue or into your sleeve (not your hands). . Avoid shaking hands with others and consider head nods or verbal greetings only. . Avoid touching your eyes, nose, or mouth with unwashed hands.  . Avoid close contact with people who are sick. . Avoid places or events with large numbers of people in one location, like concerts or  sporting events. . Carefully consider travel plans you have or are making. . If you are planning any travel outside or inside the [place], visit the CDC's Travelers' Health webpage for the latest health notices. . If you have some symptoms but not all symptoms, continue to monitor at home and seek medical attention if your symptoms worsen. . If you are having a medical emergency, call 911.   [place] / e-Visit: [email or web address]         [facility]: [facility]: [phone]                   [facility]: [phone]

## 2018-08-28 ENCOUNTER — Encounter (HOSPITAL_BASED_OUTPATIENT_CLINIC_OR_DEPARTMENT_OTHER): Payer: Self-pay | Admitting: Surgery

## 2018-08-31 ENCOUNTER — Inpatient Hospital Stay: Payer: Medicare Other

## 2018-08-31 ENCOUNTER — Encounter (HOSPITAL_BASED_OUTPATIENT_CLINIC_OR_DEPARTMENT_OTHER): Payer: Self-pay | Admitting: Surgery

## 2018-08-31 ENCOUNTER — Encounter: Payer: Self-pay | Admitting: Hematology and Oncology

## 2018-08-31 ENCOUNTER — Other Ambulatory Visit: Payer: Self-pay | Admitting: Hematology and Oncology

## 2018-08-31 ENCOUNTER — Other Ambulatory Visit: Payer: Self-pay

## 2018-08-31 VITALS — BP 168/69 | HR 64 | Temp 97.6°F | Resp 18 | Wt 182.5 lb

## 2018-08-31 DIAGNOSIS — C50411 Malignant neoplasm of upper-outer quadrant of right female breast: Secondary | ICD-10-CM

## 2018-08-31 DIAGNOSIS — Z5112 Encounter for antineoplastic immunotherapy: Secondary | ICD-10-CM | POA: Diagnosis not present

## 2018-08-31 DIAGNOSIS — Z170 Estrogen receptor positive status [ER+]: Secondary | ICD-10-CM

## 2018-08-31 DIAGNOSIS — Z95828 Presence of other vascular implants and grafts: Secondary | ICD-10-CM

## 2018-08-31 LAB — CMP (CANCER CENTER ONLY)
ALT: 13 U/L (ref 0–44)
AST: 13 U/L — ABNORMAL LOW (ref 15–41)
Albumin: 3.6 g/dL (ref 3.5–5.0)
Alkaline Phosphatase: 74 U/L (ref 38–126)
Anion gap: 7 (ref 5–15)
BUN: 10 mg/dL (ref 8–23)
CO2: 29 mmol/L (ref 22–32)
Calcium: 8.7 mg/dL — ABNORMAL LOW (ref 8.9–10.3)
Chloride: 104 mmol/L (ref 98–111)
Creatinine: 0.65 mg/dL (ref 0.44–1.00)
GFR, Est AFR Am: >60 mL/min (ref >60)
GFR, Estimated: >60 mL/min (ref >60)
Glucose, Bld: 127 mg/dL — ABNORMAL HIGH (ref 70–99)
Potassium: 4 mmol/L (ref 3.5–5.1)
Sodium: 140 mmol/L (ref 135–145)
Total Bilirubin: 0.2 mg/dL — ABNORMAL LOW (ref 0.3–1.2)
Total Protein: 6.5 g/dL (ref 6.5–8.1)

## 2018-08-31 LAB — CBC WITH DIFFERENTIAL (CANCER CENTER ONLY)
Abs Immature Granulocytes: 0.01 10*3/uL (ref 0.00–0.07)
Basophils Absolute: 0 10*3/uL (ref 0.0–0.1)
Basophils Relative: 1 %
Eosinophils Absolute: 0.1 10*3/uL (ref 0.0–0.5)
Eosinophils Relative: 1 %
HCT: 39.9 % (ref 36.0–46.0)
Hemoglobin: 12.3 g/dL (ref 12.0–15.0)
Immature Granulocytes: 0 %
Lymphocytes Relative: 28 %
Lymphs Abs: 1.3 10*3/uL (ref 0.7–4.0)
MCH: 28.6 pg (ref 26.0–34.0)
MCHC: 30.8 g/dL (ref 30.0–36.0)
MCV: 92.8 fL (ref 80.0–100.0)
Monocytes Absolute: 0.3 10*3/uL (ref 0.1–1.0)
Monocytes Relative: 7 %
Neutro Abs: 2.9 10*3/uL (ref 1.7–7.7)
Neutrophils Relative %: 63 %
Platelet Count: 169 10*3/uL (ref 150–400)
RBC: 4.3 MIL/uL (ref 3.87–5.11)
RDW: 13.1 % (ref 11.5–15.5)
WBC Count: 4.6 10*3/uL (ref 4.0–10.5)
nRBC: 0 % (ref 0.0–0.2)

## 2018-08-31 MED ORDER — DEXAMETHASONE SODIUM PHOSPHATE 10 MG/ML IJ SOLN
INTRAMUSCULAR | Status: AC
  Filled 2018-08-31: qty 1

## 2018-08-31 MED ORDER — ACETAMINOPHEN 325 MG PO TABS
ORAL_TABLET | ORAL | Status: AC
  Filled 2018-08-31: qty 2

## 2018-08-31 MED ORDER — DEXAMETHASONE SODIUM PHOSPHATE 10 MG/ML IJ SOLN
10.0000 mg | Freq: Once | INTRAMUSCULAR | Status: AC
  Administered 2018-08-31: 10 mg via INTRAVENOUS

## 2018-08-31 MED ORDER — SODIUM CHLORIDE 0.9% FLUSH
10.0000 mL | Freq: Once | INTRAVENOUS | Status: AC
  Administered 2018-08-31: 14:00:00 10 mL
  Filled 2018-08-31: qty 10

## 2018-08-31 MED ORDER — ACETAMINOPHEN 325 MG PO TABS
650.0000 mg | ORAL_TABLET | Freq: Once | ORAL | Status: AC
  Administered 2018-08-31: 650 mg via ORAL

## 2018-08-31 MED ORDER — DIPHENHYDRAMINE HCL 50 MG/ML IJ SOLN
INTRAMUSCULAR | Status: AC
  Filled 2018-08-31: qty 1

## 2018-08-31 MED ORDER — SODIUM CHLORIDE 0.9 % IV SOLN
80.0000 mg/m2 | Freq: Once | INTRAVENOUS | Status: AC
  Administered 2018-08-31: 162 mg via INTRAVENOUS
  Filled 2018-08-31: qty 27

## 2018-08-31 MED ORDER — TRASTUZUMAB CHEMO 150 MG IV SOLR
150.0000 mg | Freq: Once | INTRAVENOUS | Status: AC
  Administered 2018-08-31: 150 mg via INTRAVENOUS
  Filled 2018-08-31: qty 7.14

## 2018-08-31 MED ORDER — HEPARIN SOD (PORK) LOCK FLUSH 100 UNIT/ML IV SOLN
500.0000 [IU] | Freq: Once | INTRAVENOUS | Status: AC | PRN
  Administered 2018-08-31: 500 [IU]
  Filled 2018-08-31: qty 5

## 2018-08-31 MED ORDER — SODIUM CHLORIDE 0.9 % IV SOLN
Freq: Once | INTRAVENOUS | Status: AC
  Administered 2018-08-31: 14:00:00 via INTRAVENOUS
  Filled 2018-08-31: qty 250

## 2018-08-31 MED ORDER — SODIUM CHLORIDE 0.9% FLUSH
10.0000 mL | INTRAVENOUS | Status: DC | PRN
  Administered 2018-08-31: 10 mL
  Filled 2018-08-31: qty 10

## 2018-08-31 MED ORDER — FAMOTIDINE IN NACL 20-0.9 MG/50ML-% IV SOLN
20.0000 mg | Freq: Once | INTRAVENOUS | Status: AC
  Administered 2018-08-31: 15:00:00 20 mg via INTRAVENOUS

## 2018-08-31 MED ORDER — DIPHENHYDRAMINE HCL 50 MG/ML IJ SOLN
25.0000 mg | Freq: Once | INTRAMUSCULAR | Status: AC
  Administered 2018-08-31: 25 mg via INTRAVENOUS

## 2018-08-31 MED ORDER — FAMOTIDINE IN NACL 20-0.9 MG/50ML-% IV SOLN
INTRAVENOUS | Status: AC
  Filled 2018-08-31: qty 50

## 2018-08-31 NOTE — Progress Notes (Signed)
Called pt to introduce myself as her Arboriculturist.  Pt has 2 insurances so copay assistance shouldn't be needed.  I offered the [employer] and went over what it covers.  Pt declined at this time.  Pt will get my card from registration staff in case she changes her mind and for any questions or concerns she may have in the future.

## 2018-08-31 NOTE — Patient Instructions (Signed)
[facility] Discharge Instructions for Patients Receiving Chemotherapy  Today you received the following chemotherapy agents Herceptin, Taxol  To help prevent nausea and vomiting after your treatment, we encourage you to take your nausea medication: As directed by MD   If you develop nausea and vomiting that is not controlled by your nausea medication, call the clinic.   BELOW ARE SYMPTOMS THAT SHOULD BE REPORTED IMMEDIATELY:  *FEVER GREATER THAN 100.5 F  *CHILLS WITH OR WITHOUT FEVER  NAUSEA AND VOMITING THAT IS NOT CONTROLLED WITH YOUR NAUSEA MEDICATION  *UNUSUAL SHORTNESS OF BREATH  *UNUSUAL BRUISING OR BLEEDING  TENDERNESS IN MOUTH AND THROAT WITH OR WITHOUT PRESENCE OF ULCERS  *URINARY PROBLEMS  *BOWEL PROBLEMS  UNUSUAL RASH Items with * indicate a potential emergency and should be followed up as soon as possible.  Feel free to call the clinic should you have any questions or concerns. The clinic phone number is [phone].  Please show the [facility] at check-in to the Emergency Department and triage nurse.   Coronavirus (COVID-19) Are you at risk?  Are you at risk for the Coronavirus (COVID-19)?  To be considered HIGH RISK for Coronavirus (COVID-19), you have to meet the following criteria:  . Traveled to [place], [place], [place], [place] or [place]; or in the [place] to [place], [place], [place], or [place]; and have fever, cough, and shortness of breath within the last 2 weeks of travel OR . Been in close contact with a person diagnosed with COVID-19 within the last 2 weeks and have fever, cough, and shortness of breath . IF YOU DO NOT MEET THESE CRITERIA, YOU ARE CONSIDERED LOW RISK FOR COVID-19.  What to do if you are HIGH RISK for COVID-19?  Marland Kitchen If you are having a medical emergency, call 911. . Seek medical care right away. Before you go to a doctor's office, urgent care or emergency department, call ahead and tell them  about your recent travel, contact with someone diagnosed with COVID-19, and your symptoms. You should receive instructions from your physician's office regarding next steps of care.  . When you arrive at healthcare provider, tell the healthcare staff immediately you have returned from visiting [place], [place], [place], [place] or [place]; or traveled in the [place] to [place], [place], [place], or [place]; in the last two weeks or you have been in close contact with a person diagnosed with COVID-19 in the last 2 weeks.   . Tell the health care staff about your symptoms: fever, cough and shortness of breath. . After you have been seen by a medical provider, you will be either: o Tested for (COVID-19) and discharged home on quarantine except to seek medical care if symptoms worsen, and asked to  - Stay home and avoid contact with others until you get your results (4-5 days)  - Avoid travel on public transportation if possible (such as bus, train, or airplane) or o Sent to the Emergency Department by EMS for evaluation, COVID-19 testing, and possible admission depending on your condition and test results.  What to do if you are LOW RISK for COVID-19?  Reduce your risk of any infection by using the same precautions used for avoiding the common cold or flu:  Marland Kitchen Wash your hands often with soap and warm water for at least 20 seconds.  If soap and water are not readily available, use an alcohol-based hand sanitizer with at least 60% alcohol.  Marland Kitchen  If coughing or sneezing, cover your mouth and nose by coughing or sneezing into the elbow areas of your shirt or coat, into a tissue or into your sleeve (not your hands). . Avoid shaking hands with others and consider head nods or verbal greetings only. . Avoid touching your eyes, nose, or mouth with unwashed hands.  . Avoid close contact with people who are sick. . Avoid places or events with large numbers of people in one location, like concerts or  sporting events. . Carefully consider travel plans you have or are making. . If you are planning any travel outside or inside the [place], visit the CDC's Travelers' Health webpage for the latest health notices. . If you have some symptoms but not all symptoms, continue to monitor at home and seek medical attention if your symptoms worsen. . If you are having a medical emergency, call 911.   [employer] / e-Visit: [email or web address]         [facility]: [facility]: [phone]                   [facility]: [phone]

## 2018-08-31 NOTE — Assessment & Plan Note (Signed)
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 Treatment plan: 1.Adj chemo with Taxol-Herceptin weekly X 12 and then q 3 weeks Herceptin. 2.Adjuvant radiation therapy 3.Followed by adjuvant antiestrogen therapy ----------------------------------------------------------------------------------------------------------------------------------------------- Current treatment: Cycle 4 Taxol Herceptin Chemo toxicities: Denies any side effects from chemotherapy.  Labs reviewed Closely monitoring for chemo toxicities Return to clinic weekly for Taxol Herceptin's and every other week for follow-up with me.

## 2018-09-01 ENCOUNTER — Ambulatory Visit (INDEPENDENT_AMBULATORY_CARE_PROVIDER_SITE_OTHER): Payer: Medicare Other

## 2018-09-01 DIAGNOSIS — C50411 Malignant neoplasm of upper-outer quadrant of right female breast: Secondary | ICD-10-CM | POA: Diagnosis not present

## 2018-09-01 DIAGNOSIS — Z170 Estrogen receptor positive status [ER+]: Secondary | ICD-10-CM

## 2018-09-01 DIAGNOSIS — I10 Essential (primary) hypertension: Secondary | ICD-10-CM

## 2018-09-01 DIAGNOSIS — I517 Cardiomegaly: Secondary | ICD-10-CM

## 2018-09-01 DIAGNOSIS — I4891 Unspecified atrial fibrillation: Secondary | ICD-10-CM | POA: Diagnosis not present

## 2018-09-01 NOTE — Progress Notes (Signed)
Complete echocardiogram has been performed.  Jimmy Akeylah Hendel, RDCS, RVT 

## 2018-09-05 ENCOUNTER — Telehealth: Payer: Self-pay | Admitting: *Deleted

## 2018-09-05 NOTE — Telephone Encounter (Signed)
Received call from pt stating she has been experiencing diarrhea since Sunday. Pt states that on Sunday 09/03/18 she experienced watery diarrhea after everything she ate and drank.  On Monday 5/25 pt states she had 6 episodes of watery diarrhea and was able to get imodium to take.  Pt states that today she has not experienced any episodes of diarrhea but was calling for suggestions on what to do.  Pt educated on importance of staying hydrated with water and Gatorade and to eat bland foods. Pt also educated to take imodium if she does experience any diarrhea today and to take as instructed on the bottle.  RN offered pt to come into [facility] today but pt refused stating she had an apt on Thursday.  RN instructed pt to call clinic if symptoms worsen, pt verbalized understanding.

## 2018-09-06 NOTE — Progress Notes (Signed)
Patient Care Team: Enid Skeens., MD as PCP - General (Family Medicine) Mauro Kaufmann, RN as Oncology Nurse Navigator Rockwell Germany, RN as Oncology Nurse Navigator Erroll Luna, MD as Consulting Physician (General Surgery) Nicholas Lose, MD as Consulting Physician (Hematology and Oncology) Eppie Gibson, MD as Attending Physician (Radiation Oncology)  DIAGNOSIS:    ICD-10-CM   1. Malignant neoplasm of upper-outer quadrant of right breast in female, estrogen receptor positive ([place]) C50.411    Z17.0     SUMMARY OF ONCOLOGIC HISTORY:   Malignant neoplasm of upper-outer quadrant of right breast in female, estrogen receptor positive ([place])   06/19/2018 Initial Diagnosis    Screening mammogram detected right breast mass at 9 o'clock position 3 cm from the nipple, ultrasound revealed 7 x 5 x 5 mm mass, axillary ultrasound normal-appearing lymph nodes, ultrasound biopsy: Grade 1 IDC ER 100%, PR 10%, Ki-67 10%, HER-2 3+ positive, T1b N0 stage Ia    06/28/2018 Cancer Staging    Staging form: Breast, AJCC 8th Edition - Clinical stage from 06/28/2018: Stage IA (cT1b, cN0, cM0, G1, ER+, PR+, HER2+) - Signed by Nicholas Lose, MD on 06/28/2018    07/05/2018 Genetic Testing    Negative genetic testing on the common hereditary cancer panel.  The Hereditary Gene Panel offered by [facility] includes sequencing and/or deletion duplication testing of the following 48 genes: APC, ATM, AXIN2, BARD1, BMPR1A, BRCA1, BRCA2, BRIP1, CDH1, CDK4, CDKN2A (p14ARF), CDKN2A (p16INK4a), CHEK2, CTNNA1, DICER1, EPCAM (Deletion/duplication testing only), GREM1 (promoter region deletion/duplication testing only), KIT, MEN1, MLH1, MSH2, MSH3, MSH6, MUTYH, NBN, NF1, NHTL1, PALB2, PDGFRA, PMS2, POLD1, POLE, PTEN, RAD50, RAD51C, RAD51D, RNF43, SDHB, SDHC, SDHD, SMAD4, SMARCA4. STK11, TP53, TSC1, TSC2, and VHL.  The following genes were evaluated for sequence changes only: SDHA and HOXB13 c.251G>A variant only. The report date  is July 05, 2018.    07/13/2018 Surgery    Right lumpectomy ([facility]): IDC with DCIS, 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     08/17/2018 -  Chemotherapy    The patient had trastuzumab (HERCEPTIN) 300 mg in sodium chloride 0.9 % 250 mL chemo infusion, 315 mg, Intravenous,  Once, 1 of 16 cycles Administration: 300 mg (08/17/2018), 150 mg (08/31/2018), 150 mg (08/24/2018) PACLitaxel (TAXOL) 162 mg in sodium chloride 0.9 % 250 mL chemo infusion (</= 19m/m2), 80 mg/m2 = 162 mg, Intravenous,  Once, 1 of 3 cycles Administration: 162 mg (08/17/2018), 162 mg (08/24/2018), 162 mg (08/31/2018)  for chemotherapy treatment.      CHIEF COMPLIANT: Cycle 4 Taxol and Herceptin  INTERVAL HISTORY: Sabrina Occhipintiis a 68y.o. with above-mentioned history of right breast cancer who underwent a lumpectomy and is currently on adjuvant chemotherapy with weekly Taxol and Herceptin. An ECHO on 09/01/18 showed an ejection fraction in the range of 60-65%. She presents to the clinic today for cycle 4 and a toxicity check.  She complains of profound diarrhea.  In spite of Imodium she has had intermittent diarrhea.  She has watery diarrhea and cannot count the number of times she is going.  She felt dizzy and lightheaded couple of days and had to call her daughter to help her with shower.  REVIEW OF SYSTEMS:   Constitutional: Denies fevers, chills or abnormal weight loss Eyes: Denies blurriness of vision Ears, nose, mouth, throat, and face: Denies mucositis or sore throat Respiratory: Denies cough, dyspnea or wheezes Cardiovascular: Denies palpitation, chest discomfort Gastrointestinal: Diarrhea several times  in the day Skin: Denies abnormal skin rashes Lymphatics: Denies new lymphadenopathy or easy bruising Neurological: Denies numbness, tingling or new weaknesses Behavioral/Psych: Mood is stable, no new changes  Extremities: No lower extremity edema Breast: denies any pain  or lumps or nodules in either breasts All other systems were reviewed with the patient and are negative.  I have reviewed the past medical history, past surgical history, social history and family history with the patient and they are unchanged from previous note.  ALLERGIES:  is allergic to atorvastatin and meperidine.  MEDICATIONS:  Current Outpatient Medications  Medication Sig Dispense Refill  . digoxin (LANOXIN) 0.125 MG tablet TAKE 1 TABLET DAILY 90 tablet 1  . escitalopram (LEXAPRO) 10 MG tablet Take 1 tablet by mouth daily.    . flecainide (TAMBOCOR) 100 MG tablet Take 1 tablet (100 mg total) by mouth 2 (two) times daily. 180 tablet 3  . ibuprofen (ADVIL,MOTRIN) 800 MG tablet Take 1 tablet (800 mg total) by mouth every 8 (eight) hours as needed. 30 tablet 0  . levothyroxine (SYNTHROID, LEVOTHROID) 100 MCG tablet Take 1 tablet by mouth daily.    Marland Kitchen lidocaine-prilocaine (EMLA) cream Apply to affected area once 30 g 3  . lisinopril (PRINIVIL,ZESTRIL) 10 MG tablet Take 1 tablet (10 mg total) by mouth daily. 90 tablet 3  . metFORMIN (GLUCOPHAGE) 500 MG tablet Take 1 tablet by mouth 2 (two) times daily with a meal.     . metoprolol succinate (TOPROL-XL) 50 MG 24 hr tablet Take 1 tablet (50 mg total) by mouth daily. 90 tablet 3  . ondansetron (ZOFRAN) 8 MG tablet Take 1 tablet (8 mg total) by mouth 2 (two) times daily as needed (Nausea or vomiting). 30 tablet 1  . prochlorperazine (COMPAZINE) 10 MG tablet Take 1 tablet (10 mg total) by mouth every 6 (six) hours as needed (Nausea or vomiting). 30 tablet 1  . warfarin (COUMADIN) 10 MG tablet TAKE AS DIRECTED BY COUMADIN CLINIC 30 tablet 0   No current facility-administered medications for this visit.     PHYSICAL EXAMINATION: ECOG PERFORMANCE STATUS: 1 - Symptomatic but completely ambulatory  Vitals:   09/07/18 0840  BP: 128/68  Pulse: 94  Resp: 18  Temp: 98 F (36.7 C)  SpO2: 98%   Filed Weights   09/07/18 0840  Weight: 178 lb  8 oz (81 kg)    Physical exam not performed due to COVID-19 precautions  LABORATORY DATA:  I have reviewed the data as listed CMP Latest Ref Rng & Units 08/31/2018 08/24/2018 08/17/2018  Glucose 70 - 99 mg/dL 127(H) 113(H) 153(H)  BUN 8 - 23 mg/dL _0 Creatinine 0.44 - 1.00 mg/dL 0.65 0.65 0.66  Sodium 135 - 145 mmol/L 140 139 141  Potassium 3.5 - 5.1 mmol/L 4.0 4.3 4.1  Chloride 98 - 111 mmol/L 104 103 101  CO2 22 - 32 mmol/L _1 Calcium 8.9 - 10.3 mg/dL 8.7(L) 8.9 8.9  Total Protein 6.5 - 8.1 g/dL 6.5 6.7 7.2  Total Bilirubin 0.3 - 1.2 mg/dL 0.2(L) 0.3 0.2(L)  Alkaline Phos 38 - 126 U/L 74 81 87  AST 15 - 41 U/L 13(L) 14(L) 15  ALT 0 - 44 U/L _2 Lab Results  Component Value Date   WBC 5.7 09/07/2018   HGB 12.2 09/07/2018   HCT 39.8 09/07/2018   MCV 93.0 09/07/2018   PLT 225 09/07/2018   NEUTROABS 3.7 09/07/2018    ASSESSMENT &  PLAN:  Malignant neoplasm of upper-outer quadrant of right breast in female, estrogen receptor positive ([place]) 07/13/2018:Right lumpectomy ([facility]): IDC with DCIS, 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 Treatment plan: 1.Adj chemo with Taxol-Herceptin weekly X 12 and then q 3 weeks Herceptin. 2.Adjuvant radiation therapy 3.Followed by adjuvant antiestrogen therapy ----------------------------------------------------------------------------------------------------------------------------------------------- Current treatment: Cycle 4 Taxol Herceptin Chemo toxicities:  1.  Profound diarrhea: Currently taking Imodium up to 4 tablets a day.  I sent a prescription for Lomotil. 2.  Fatigue as a result of diarrhea 3.  Hair thinning We are waiting for potassium and creatinine clearance from today.  I instructed her to take potassium containing foods. Labs reviewed Closely monitoring for chemo toxicities Return to clinic weekly for Taxol Herceptin's and every other week for follow-up  with me.  No orders of the defined types were placed in this encounter.  The patient has a good understanding of the overall plan. she agrees with it. she will call with any problems that may develop before the next visit here.  Nicholas Lose, MD 09/07/2018  Julious Oka Dorshimer am acting as scribe for Dr. Nicholas Lose.  I have reviewed the above documentation for accuracy and completeness, and I agree with the above.

## 2018-09-07 ENCOUNTER — Inpatient Hospital Stay: Payer: Medicare Other

## 2018-09-07 ENCOUNTER — Other Ambulatory Visit: Payer: Self-pay

## 2018-09-07 ENCOUNTER — Inpatient Hospital Stay (HOSPITAL_BASED_OUTPATIENT_CLINIC_OR_DEPARTMENT_OTHER): Payer: Medicare Other | Admitting: Hematology and Oncology

## 2018-09-07 ENCOUNTER — Ambulatory Visit: Payer: Medicare Other | Admitting: Cardiology

## 2018-09-07 ENCOUNTER — Telehealth: Payer: Self-pay | Admitting: *Deleted

## 2018-09-07 DIAGNOSIS — C50411 Malignant neoplasm of upper-outer quadrant of right female breast: Secondary | ICD-10-CM | POA: Diagnosis not present

## 2018-09-07 DIAGNOSIS — Z170 Estrogen receptor positive status [ER+]: Secondary | ICD-10-CM

## 2018-09-07 DIAGNOSIS — Z95828 Presence of other vascular implants and grafts: Secondary | ICD-10-CM

## 2018-09-07 DIAGNOSIS — R197 Diarrhea, unspecified: Secondary | ICD-10-CM | POA: Diagnosis not present

## 2018-09-07 DIAGNOSIS — Z5112 Encounter for antineoplastic immunotherapy: Secondary | ICD-10-CM | POA: Diagnosis not present

## 2018-09-07 LAB — CBC WITH DIFFERENTIAL (CANCER CENTER ONLY)
Abs Immature Granulocytes: 0.03 10*3/uL (ref 0.00–0.07)
Basophils Absolute: 0 10*3/uL (ref 0.0–0.1)
Basophils Relative: 1 %
Eosinophils Absolute: 0 10*3/uL (ref 0.0–0.5)
Eosinophils Relative: 1 %
HCT: 39.8 % (ref 36.0–46.0)
Hemoglobin: 12.2 g/dL (ref 12.0–15.0)
Immature Granulocytes: 1 %
Lymphocytes Relative: 26 %
Lymphs Abs: 1.5 10*3/uL (ref 0.7–4.0)
MCH: 28.5 pg (ref 26.0–34.0)
MCHC: 30.7 g/dL (ref 30.0–36.0)
MCV: 93 fL (ref 80.0–100.0)
Monocytes Absolute: 0.4 10*3/uL (ref 0.1–1.0)
Monocytes Relative: 6 %
Neutro Abs: 3.7 10*3/uL (ref 1.7–7.7)
Neutrophils Relative %: 65 %
Platelet Count: 225 10*3/uL (ref 150–400)
RBC: 4.28 MIL/uL (ref 3.87–5.11)
RDW: 13.4 % (ref 11.5–15.5)
WBC Count: 5.7 10*3/uL (ref 4.0–10.5)
nRBC: 0 % (ref 0.0–0.2)

## 2018-09-07 LAB — CMP (CANCER CENTER ONLY)
ALT: 13 U/L (ref 0–44)
AST: 13 U/L — ABNORMAL LOW (ref 15–41)
Albumin: 3.5 g/dL (ref 3.5–5.0)
Alkaline Phosphatase: 65 U/L (ref 38–126)
Anion gap: 7 (ref 5–15)
BUN: 11 mg/dL (ref 8–23)
CO2: 27 mmol/L (ref 22–32)
Calcium: 8.9 mg/dL (ref 8.9–10.3)
Chloride: 105 mmol/L (ref 98–111)
Creatinine: 0.66 mg/dL (ref 0.44–1.00)
GFR, Est AFR Am: >60 mL/min (ref >60)
GFR, Estimated: >60 mL/min (ref >60)
Glucose, Bld: 155 mg/dL — ABNORMAL HIGH (ref 70–99)
Potassium: 4.1 mmol/L (ref 3.5–5.1)
Sodium: 139 mmol/L (ref 135–145)
Total Bilirubin: 0.2 mg/dL — ABNORMAL LOW (ref 0.3–1.2)
Total Protein: 6.3 g/dL — ABNORMAL LOW (ref 6.5–8.1)

## 2018-09-07 MED ORDER — SODIUM CHLORIDE 0.9 % IV SOLN
Freq: Once | INTRAVENOUS | Status: AC
  Administered 2018-09-07: 09:00:00 via INTRAVENOUS
  Filled 2018-09-07: qty 250

## 2018-09-07 MED ORDER — DEXAMETHASONE SODIUM PHOSPHATE 10 MG/ML IJ SOLN
10.0000 mg | Freq: Once | INTRAMUSCULAR | Status: AC
  Administered 2018-09-07: 10 mg via INTRAVENOUS

## 2018-09-07 MED ORDER — FAMOTIDINE IN NACL 20-0.9 MG/50ML-% IV SOLN
INTRAVENOUS | Status: AC
  Filled 2018-09-07: qty 50

## 2018-09-07 MED ORDER — SODIUM CHLORIDE 0.9 % IV SOLN
80.0000 mg/m2 | Freq: Once | INTRAVENOUS | Status: AC
  Administered 2018-09-07: 11:00:00 162 mg via INTRAVENOUS
  Filled 2018-09-07: qty 27

## 2018-09-07 MED ORDER — DIPHENOXYLATE-ATROPINE 2.5-0.025 MG PO TABS: 1.0000 | ORAL_TABLET | Freq: Two times a day (BID) | ORAL | 1 refills | Status: DC | PRN

## 2018-09-07 MED ORDER — DEXAMETHASONE SODIUM PHOSPHATE 10 MG/ML IJ SOLN
INTRAMUSCULAR | Status: AC
  Filled 2018-09-07: qty 1

## 2018-09-07 MED ORDER — FAMOTIDINE IN NACL 20-0.9 MG/50ML-% IV SOLN
20.0000 mg | Freq: Once | INTRAVENOUS | Status: AC
  Administered 2018-09-07: 20 mg via INTRAVENOUS

## 2018-09-07 MED ORDER — DIPHENHYDRAMINE HCL 50 MG/ML IJ SOLN
25.0000 mg | Freq: Once | INTRAMUSCULAR | Status: AC
  Administered 2018-09-07: 25 mg via INTRAVENOUS

## 2018-09-07 MED ORDER — DIPHENHYDRAMINE HCL 50 MG/ML IJ SOLN
INTRAMUSCULAR | Status: AC
  Filled 2018-09-07: qty 1

## 2018-09-07 MED ORDER — TRASTUZUMAB CHEMO 150 MG IV SOLR
150.0000 mg | Freq: Once | INTRAVENOUS | Status: AC
  Administered 2018-09-07: 10:00:00 150 mg via INTRAVENOUS
  Filled 2018-09-07: qty 7.14

## 2018-09-07 MED ORDER — SODIUM CHLORIDE 0.9% FLUSH
10.0000 mL | INTRAVENOUS | Status: DC | PRN
  Administered 2018-09-07: 12:00:00 10 mL
  Filled 2018-09-07: qty 10

## 2018-09-07 MED ORDER — SODIUM CHLORIDE 0.9% FLUSH
10.0000 mL | Freq: Once | INTRAVENOUS | Status: AC
  Administered 2018-09-07: 10 mL
  Filled 2018-09-07: qty 10

## 2018-09-07 MED ORDER — ACETAMINOPHEN 325 MG PO TABS
650.0000 mg | ORAL_TABLET | Freq: Once | ORAL | Status: AC
  Administered 2018-09-07: 650 mg via ORAL

## 2018-09-07 MED ORDER — HEPARIN SOD (PORK) LOCK FLUSH 100 UNIT/ML IV SOLN
500.0000 [IU] | Freq: Once | INTRAVENOUS | Status: AC | PRN
  Administered 2018-09-07: 500 [IU]
  Filled 2018-09-07: qty 5

## 2018-09-07 MED ORDER — ACETAMINOPHEN 325 MG PO TABS
ORAL_TABLET | ORAL | Status: AC
  Filled 2018-09-07: qty 2

## 2018-09-07 NOTE — Patient Instructions (Signed)
[facility] Discharge Instructions for Patients Receiving Chemotherapy  Today you received the following chemotherapy agents Herceptin, Taxol  To help prevent nausea and vomiting after your treatment, we encourage you to take your nausea medication: As directed by MD   If you develop nausea and vomiting that is not controlled by your nausea medication, call the clinic.   BELOW ARE SYMPTOMS THAT SHOULD BE REPORTED IMMEDIATELY:  *FEVER GREATER THAN 100.5 F  *CHILLS WITH OR WITHOUT FEVER  NAUSEA AND VOMITING THAT IS NOT CONTROLLED WITH YOUR NAUSEA MEDICATION  *UNUSUAL SHORTNESS OF BREATH  *UNUSUAL BRUISING OR BLEEDING  TENDERNESS IN MOUTH AND THROAT WITH OR WITHOUT PRESENCE OF ULCERS  *URINARY PROBLEMS  *BOWEL PROBLEMS  UNUSUAL RASH Items with * indicate a potential emergency and should be followed up as soon as possible.  Feel free to call the clinic should you have any questions or concerns. The clinic phone number is [phone].  Please show the [facility] at check-in to the Emergency Department and triage nurse.   Coronavirus (COVID-19) Are you at risk?  Are you at risk for the Coronavirus (COVID-19)?  To be considered HIGH RISK for Coronavirus (COVID-19), you have to meet the following criteria:  . Traveled to [place], [place], [place], [place] or [place]; or in the [place] to [place], [place], [place], or [place]; and have fever, cough, and shortness of breath within the last 2 weeks of travel OR . Been in close contact with a person diagnosed with COVID-19 within the last 2 weeks and have fever, cough, and shortness of breath . IF YOU DO NOT MEET THESE CRITERIA, YOU ARE CONSIDERED LOW RISK FOR COVID-19.  What to do if you are HIGH RISK for COVID-19?  Marland Kitchen If you are having a medical emergency, call 911. . Seek medical care right away. Before you go to a doctor's office, urgent care or emergency department, call ahead and tell them  about your recent travel, contact with someone diagnosed with COVID-19, and your symptoms. You should receive instructions from your physician's office regarding next steps of care.  . When you arrive at healthcare provider, tell the healthcare staff immediately you have returned from visiting [place], [place], [place], [place] or [place]; or traveled in the [place] to [place], [place], [place], or [place]; in the last two weeks or you have been in close contact with a person diagnosed with COVID-19 in the last 2 weeks.   . Tell the health care staff about your symptoms: fever, cough and shortness of breath. . After you have been seen by a medical provider, you will be either: o Tested for (COVID-19) and discharged home on quarantine except to seek medical care if symptoms worsen, and asked to  - Stay home and avoid contact with others until you get your results (4-5 days)  - Avoid travel on public transportation if possible (such as bus, train, or airplane) or o Sent to the Emergency Department by EMS for evaluation, COVID-19 testing, and possible admission depending on your condition and test results.  What to do if you are LOW RISK for COVID-19?  Reduce your risk of any infection by using the same precautions used for avoiding the common cold or flu:  Marland Kitchen Wash your hands often with soap and warm water for at least 20 seconds.  If soap and water are not readily available, use an alcohol-based hand sanitizer with at least 60% alcohol.  Marland Kitchen  If coughing or sneezing, cover your mouth and nose by coughing or sneezing into the elbow areas of your shirt or coat, into a tissue or into your sleeve (not your hands). . Avoid shaking hands with others and consider head nods or verbal greetings only. . Avoid touching your eyes, nose, or mouth with unwashed hands.  . Avoid close contact with people who are sick. . Avoid places or events with large numbers of people in one location, like concerts or  sporting events. . Carefully consider travel plans you have or are making. . If you are planning any travel outside or inside the [place], visit the CDC's Travelers' Health webpage for the latest health notices. . If you have some symptoms but not all symptoms, continue to monitor at home and seek medical attention if your symptoms worsen. . If you are having a medical emergency, call 911.   [place] / e-Visit: [email or web address]         [facility]: [facility]: [phone]                   [facility]: [phone]

## 2018-09-07 NOTE — Telephone Encounter (Signed)
Spoke to pt after Taxol/herceptin cycle 4. Relate doing much better now that diarrhea has subsided. Denies questions or concerns at this time. Encourage pt to call with needs. Received verbal understanding.

## 2018-09-08 ENCOUNTER — Telehealth: Payer: Self-pay | Admitting: *Deleted

## 2018-09-08 NOTE — Telephone Encounter (Signed)
Received VM from pt stating that her insurance will not cover Lomotil.  When RN called pt back, pt stated that her daughter was able to find a [place] coupon to apply to the prescription.  Pt appreciative of call back.

## 2018-09-11 ENCOUNTER — Other Ambulatory Visit: Payer: Self-pay | Admitting: Pharmacist

## 2018-09-11 ENCOUNTER — Other Ambulatory Visit: Payer: Self-pay

## 2018-09-11 ENCOUNTER — Encounter: Payer: Self-pay | Admitting: Cardiology

## 2018-09-11 ENCOUNTER — Encounter: Payer: Self-pay | Admitting: *Deleted

## 2018-09-11 ENCOUNTER — Telehealth (INDEPENDENT_AMBULATORY_CARE_PROVIDER_SITE_OTHER): Payer: Medicare Other | Admitting: Cardiology

## 2018-09-11 VITALS — Wt 178.0 lb

## 2018-09-11 DIAGNOSIS — I480 Paroxysmal atrial fibrillation: Secondary | ICD-10-CM

## 2018-09-11 DIAGNOSIS — E785 Hyperlipidemia, unspecified: Secondary | ICD-10-CM

## 2018-09-11 DIAGNOSIS — I10 Essential (primary) hypertension: Secondary | ICD-10-CM

## 2018-09-11 DIAGNOSIS — C50411 Malignant neoplasm of upper-outer quadrant of right female breast: Secondary | ICD-10-CM

## 2018-09-11 DIAGNOSIS — Z170 Estrogen receptor positive status [ER+]: Secondary | ICD-10-CM

## 2018-09-11 DIAGNOSIS — E119 Type 2 diabetes mellitus without complications: Secondary | ICD-10-CM

## 2018-09-11 DIAGNOSIS — Z7901 Long term (current) use of anticoagulants: Secondary | ICD-10-CM

## 2018-09-11 MED ORDER — DIGOXIN 125 MCG PO TABS: 125.0000 ug | ORAL_TABLET | Freq: Every day | ORAL | 1 refills | Status: DC

## 2018-09-11 MED ORDER — FLECAINIDE ACETATE 100 MG PO TABS: 100.0000 mg | ORAL_TABLET | Freq: Two times a day (BID) | ORAL | 1 refills | Status: DC

## 2018-09-11 MED ORDER — WARFARIN SODIUM 10 MG PO TABS: ORAL_TABLET | ORAL | 0 refills | Status: DC

## 2018-09-11 NOTE — Progress Notes (Signed)
Virtual Visit via Video Note   This visit type was conducted due to national recommendations for restrictions regarding the COVID-19 Pandemic (e.g. social distancing) in an effort to limit this patient's exposure and mitigate transmission in our community.  Due to her co-morbid illnesses, this patient is at least at moderate risk for complications without adequate follow up.  This format is felt to be most appropriate for this patient at this time.  All issues noted in this document were discussed and addressed.  A limited physical exam was performed with this format.  Please refer to the patient's chart for her consent to telehealth for [facility].  Evaluation Performed:  Follow-up visit  This visit type was conducted due to national recommendations for restrictions regarding the COVID-19 Pandemic (e.g. social distancing).  This format is felt to be most appropriate for this patient at this time.  All issues noted in this document were discussed and addressed.  No physical exam was performed (except for noted visual exam findings with Video Visits).  Please refer to the patient's chart (MyChart message for video visits and phone note for telephone visits) for the patient's consent to telehealth for Sabrina [name].  Date:  09/11/2018  ID: [name], DOB Jul 17, 1950, MRN [id number]   Patient Location: [place] [address]   Provider location:   [place] Office  PCP:  Enid Skeens., MD  Cardiologist:  Jenne Campus, MD     Chief Complaint: Doing well  History of Present Illness:    Sabrina [name] is a 68 y.o. female  who presents via audio/video conferencing for a telehealth visit today.  With paroxysmal atrial fibrillation, essential hypertension, long-term anticoagulation with Coumadin as per her choice, dyslipidemia.  Recently diagnosed with breast cancer surgery was done she is getting chemotherapy so for 4 courses there is third courses left.   Baseline echocardiogram done before chemotherapy showed preserved left ventricular ejection fraction she required every 3 months echocardiogram and I will make arrangements for it.  Overall she is doing much better than last time I spoke to her obviously last time she was absolutely devastated with diagnosis of breast cancer however now she is coping with the situation quite well.  She promised her grandson who is 43 years old not to die because of this cancer.  And she said she is ready to keep that promise.  [place] get there this year.  Denies having any chest pain no palpitations cardiac wise doing well.   The patient does not have symptoms concerning for COVID-19 infection (fever, chills, cough, or Sabrina SHORTNESS OF BREATH).    Prior CV studies:   The following studies were reviewed today:  Echocardiogram done on 01 Sep 2018 showed:   1. The left ventricle has normal systolic function with an ejection fraction of 60-65%. The cavity size was normal. Left ventricular diastolic parameters were normal.  2. The right ventricle has normal systolic function. The cavity was normal. There is no increase in right ventricular wall thickness.      Past Medical History:  Diagnosis Date  . Atrial fibrillation (Sabrina [name]) [I48.91] 11/08/2016  . Benign essential hypertension 08/25/2015  . Diabetes (Sabrina [name])   . Family history of breast cancer   . Family history of leukemia   . Family history of prostate cancer   . Hyperlipidemia   . Hypothyroidism due to Hashimoto's thyroiditis 05/23/2015  Past Surgical History:  Procedure Laterality Date  . ABDOMINAL HYSTERECTOMY    . APPENDECTOMY    . BREAST LUMPECTOMY WITH RADIOACTIVE SEED AND SENTINEL LYMPH NODE BIOPSY Right 07/13/2018   Procedure: RIGHT BREAST LUMPECTOMY WITH RADIOACTIVE SEED AND RIGHT SENTINEL LYMPH NODE MAPPING;  Surgeon: Erroll Luna, MD;  Location: [facility];   Service: General;  Laterality: Right;  . BREAST SURGERY    . CHOLECYSTECTOMY    . EYE SURGERY    . KNEE SURGERY    . PORTACATH PLACEMENT Right 08/02/2018   Procedure: INSERTION PORT-A-CATH WITH ULTRASOUND;  Surgeon: Erroll Luna, MD;  Location: [facility];  Service: General;  Laterality: Right;     Current Meds  Medication Sig  . digoxin (LANOXIN) 0.125 MG tablet TAKE 1 TABLET DAILY  . diphenoxylate-atropine (LOMOTIL) 2.5-0.025 MG tablet Take 1 tablet by mouth 2 (two) times daily as needed for diarrhea or loose stools.  Marland Kitchen escitalopram (LEXAPRO) 10 MG tablet Take 1 tablet by mouth daily.  . flecainide (TAMBOCOR) 100 MG tablet Take 1 tablet (100 mg total) by mouth 2 (two) times daily.  Marland Kitchen ibuprofen (ADVIL,MOTRIN) 800 MG tablet Take 1 tablet (800 mg total) by mouth every 8 (eight) hours as needed.  Marland Kitchen levothyroxine (SYNTHROID, LEVOTHROID) 100 MCG tablet Take 1 tablet by mouth daily.  Marland Kitchen lidocaine-prilocaine (EMLA) cream Apply to affected area once  . lisinopril (PRINIVIL,ZESTRIL) 10 MG tablet Take 1 tablet (10 mg total) by mouth daily.  . metFORMIN (GLUCOPHAGE) 500 MG tablet Take 1 tablet by mouth 2 (two) times daily with a meal.   . metoprolol succinate (TOPROL-XL) 50 MG 24 hr tablet Take 1 tablet (50 mg total) by mouth daily.  . ondansetron (ZOFRAN) 8 MG tablet Take 1 tablet (8 mg total) by mouth 2 (two) times daily as needed (Nausea or vomiting).  . prochlorperazine (COMPAZINE) 10 MG tablet Take 1 tablet (10 mg total) by mouth every 6 (six) hours as needed (Nausea or vomiting).  . warfarin (COUMADIN) 10 MG tablet TAKE AS DIRECTED BY COUMADIN CLINIC      Family History: The patient's family history includes Acute myelogenous leukemia in her brother; Alcohol abuse in her maternal aunt, maternal uncle, and paternal uncle; Breast cancer in her cousin and maternal aunt; Breast cancer (age of onset: 36) in her cousin; Breast cancer (age of onset: 21) in her mother; Cancer in her  brother and maternal uncle; Diabetes in her maternal uncle, paternal uncle, and son; Hypertension in her maternal aunt and maternal grandmother; Prostate cancer in her father.   ROS:   Please see the history of present illness.     All other systems reviewed and are negative.   Labs/Other Tests and Data Reviewed:     Recent Labs: 09/07/2018: ALT 13; BUN 11; Creatinine 0.66; Hemoglobin 12.2; Platelet Count 225; Potassium 4.1; Sodium 139  Recent Lipid Panel No results found for: CHOL, TRIG, HDL, CHOLHDL, VLDL, LDLCALC, LDLDIRECT    Exam:    Vital Signs:  Wt 178 lb (80.7 kg)   BMI 26.29 kg/m     Wt Readings from Last 3 Encounters:  09/11/18 178 lb (80.7 kg)  09/07/18 178 lb 8 oz (81 kg)  08/31/18 182 lb 8 oz (82.8 kg)     Well nourished, well developed in no acute distress. Alert oriented x3 talking to me through the video link looking good denies having any chest pain tightness squeezing pressure burning chest at the time of my interview no swelling  Diagnosis for this visit:   1. Paroxysmal atrial fibrillation (HCC)   2. Benign essential hypertension   3. Type 2 diabetes mellitus without complication, without long-term current use of insulin ([facility])   4. Long term (current) use of anticoagulants [Z79.01]   5. Dyslipidemia   6. Malignant neoplasm of upper-outer quadrant of right breast in female, estrogen receptor positive ([facility])      ASSESSMENT & PLAN:    1.  Paroxysmal atrial fibrillation denies having any we will continue anticoagulation. 2.  Benign essential hypertension controlled with medications which I will continue. 3.  Type 2 diabetes stable. 4.  Long-term anticoagulation on Coumadin INR within therapeutic range we will continue 5.  Dyslipidemia noted. 6.  Malignant neoplasm of the breast, she is on chemotherapy so far 4 courses 8 courses left and after that I understand she will have radiation therapy.  We talked in length about this issue she is coping with  the situation quite well right now last time when I spoke her she was not in a good psychological state however now she is much better.  She will require it every 3 months echocardiogram I will make arrangements for it.  Obviously we can look at left ventricular ejection fraction as well as strength.  COVID-19 Education: The signs and symptoms of COVID-19 were discussed with the patient and how to seek care for testing (follow up with PCP or arrange E-visit).  The importance of social distancing was discussed today.  Patient Risk:   After full review of this patients clinical status, I feel that they are at least moderate risk at this time.  Time:   Today, I have spent 21 minutes with the patient with telehealth technology discussing pt health issues.  I spent 10 minutes reviewing her chart before the visit.  Visit was finished at 9:08 AM.    Medication Adjustments/Labs and Tests Ordered: Current medicines are reviewed at length with the patient today.  Concerns regarding medicines are outlined above.  No orders of the defined types were placed in this encounter.  Medication changes: No orders of the defined types were placed in this encounter.    Disposition: Follow-up 5 months.  Echocardiogram will be done in 3 months  Signed, Park Liter, MD, [name] 09/11/2018 9:09 AM    [place]

## 2018-09-11 NOTE — Patient Instructions (Signed)
Medication Instructions:  .isntcur  If you need a refill on your cardiac medications before your next appointment, please call your pharmacy.   Lab work: None.  If you have labs (blood work) drawn today and your tests are completely normal, you will receive your results only by: Marland Kitchen MyChart Message (if you have MyChart) OR . A paper copy in the mail If you have any lab test that is abnormal or we need to change your treatment, we will call you to review the results.  Testing/Procedures: Your physician has requested that you have an echocardiogram. Echocardiography is a painless test that uses sound waves to create images of your heart. It provides your doctor with information about the size and shape of your heart and how well your heart's chambers and valves are working. This procedure takes approximately one hour. There are no restrictions for this procedure.    Follow-Up: At [facility], you and your health needs are our priority.  As part of our continuing mission to provide you with exceptional heart care, we have created designated Provider Care Teams.  These Care Teams include your primary Cardiologist (physician) and Advanced Practice Providers (APPs -  Physician Assistants and Nurse Practitioners) who all work together to provide you with the care you need, when you need it. You will need a follow up appointment in 5 months.  Please call our office 2 months in advance to schedule this appointment.  You may see No primary care provider on file. or another member of our [employer] Provider Team in [place]: Shirlee More, MD . Jyl Heinz, MD  Any Other Special Instructions Will Be Listed Below (If Applicable).   Echocardiogram An echocardiogram is a procedure that uses painless sound waves (ultrasound) to produce an image of the heart. Images from an echocardiogram can provide important information about:  Signs of coronary artery disease (CAD).  Aneurysm detection. An  aneurysm is a weak or damaged part of an artery wall that bulges out from the normal force of blood pumping through the body.  Heart size and shape. Changes in the size or shape of the heart can be associated with certain conditions, including heart failure, aneurysm, and CAD.  Heart muscle function.  Heart valve function.  Signs of a past heart attack.  Fluid buildup around the heart.  Thickening of the heart muscle.  A tumor or infectious growth around the heart valves. Tell a health care provider about:  Any allergies you have.  All medicines you are taking, including vitamins, herbs, eye drops, creams, and over-the-counter medicines.  Any blood disorders you have.  Any surgeries you have had.  Any medical conditions you have.  Whether you are pregnant or may be pregnant. What are the risks? Generally, this is a safe procedure. However, problems may occur, including:  Allergic reaction to dye (contrast) that may be used during the procedure. What happens before the procedure? No specific preparation is needed. You may eat and drink normally. What happens during the procedure?   An IV tube may be inserted into one of your veins.  You may receive contrast through this tube. A contrast is an injection that improves the quality of the pictures from your heart.  A gel will be applied to your chest.  A wand-like tool (transducer) will be moved over your chest. The gel will help to transmit the sound waves from the transducer.  The sound waves will harmlessly bounce off of your heart to allow the heart  images to be captured in real-time motion. The images will be recorded on a computer. The procedure may vary among health care providers and hospitals. What happens after the procedure?  You may return to your normal, everyday life, including diet, activities, and medicines, unless your health care provider tells you not to do that. Summary  An echocardiogram is a  procedure that uses painless sound waves (ultrasound) to produce an image of the heart.  Images from an echocardiogram can provide important information about the size and shape of your heart, heart muscle function, heart valve function, and fluid buildup around your heart.  You do not need to do anything to prepare before this procedure. You may eat and drink normally.  After the echocardiogram is completed, you may return to your normal, everyday life, unless your health care provider tells you not to do that. This information is not intended to replace advice given to you by your health care provider. Make sure you discuss any questions you have with your health care provider. Document Released: 03/26/2000 Document Revised: 05/01/2016 Document Reviewed: 05/01/2016 Elsevier Interactive Patient Education  2019 [employer].

## 2018-09-11 NOTE — Addendum Note (Signed)
Addended by: Ashok Norris on: 09/11/2018 09:19 AM   Modules accepted: Orders

## 2018-09-12 ENCOUNTER — Telehealth: Payer: Self-pay | Admitting: Hematology and Oncology

## 2018-09-12 NOTE — Telephone Encounter (Signed)
Scheduled appts per sch msg.

## 2018-09-13 NOTE — Assessment & Plan Note (Signed)
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 Treatment plan: 1.Adj chemo with Taxol-Herceptin weekly X 12 and then q 3 weeks Herceptin. 2.Adjuvant radiation therapy 3.Followed by adjuvant antiestrogen therapy ----------------------------------------------------------------------------------------------------------------------------------------------- Current treatment: Cycle6Taxol Herceptin Chemo toxicities: 1.  Profound diarrhea: On Imodium and Lomotil. 2.  Fatigue as a result of diarrhea 3.  Hair thinning  Labs reviewed Closely monitoring for chemo toxicities Return to clinic weekly for Taxol Herceptin's and every other week for follow-up with me.

## 2018-09-14 ENCOUNTER — Other Ambulatory Visit: Payer: Self-pay

## 2018-09-14 ENCOUNTER — Other Ambulatory Visit: Payer: Medicare Other

## 2018-09-14 ENCOUNTER — Inpatient Hospital Stay: Payer: Medicare Other | Attending: Hematology and Oncology

## 2018-09-14 ENCOUNTER — Inpatient Hospital Stay: Payer: Medicare Other

## 2018-09-14 ENCOUNTER — Ambulatory Visit: Payer: Medicare Other | Admitting: Hematology and Oncology

## 2018-09-14 VITALS — BP 134/83 | HR 58 | Temp 97.1°F | Resp 18 | Wt 178.0 lb

## 2018-09-14 DIAGNOSIS — Z5112 Encounter for antineoplastic immunotherapy: Secondary | ICD-10-CM | POA: Diagnosis not present

## 2018-09-14 DIAGNOSIS — R197 Diarrhea, unspecified: Secondary | ICD-10-CM | POA: Insufficient documentation

## 2018-09-14 DIAGNOSIS — Z170 Estrogen receptor positive status [ER+]: Secondary | ICD-10-CM | POA: Diagnosis not present

## 2018-09-14 DIAGNOSIS — C50411 Malignant neoplasm of upper-outer quadrant of right female breast: Secondary | ICD-10-CM | POA: Insufficient documentation

## 2018-09-14 DIAGNOSIS — T451X5A Adverse effect of antineoplastic and immunosuppressive drugs, initial encounter: Secondary | ICD-10-CM | POA: Diagnosis not present

## 2018-09-14 DIAGNOSIS — D6481 Anemia due to antineoplastic chemotherapy: Secondary | ICD-10-CM | POA: Insufficient documentation

## 2018-09-14 DIAGNOSIS — R5383 Other fatigue: Secondary | ICD-10-CM | POA: Insufficient documentation

## 2018-09-14 DIAGNOSIS — Z5111 Encounter for antineoplastic chemotherapy: Secondary | ICD-10-CM | POA: Diagnosis not present

## 2018-09-14 DIAGNOSIS — Z923 Personal history of irradiation: Secondary | ICD-10-CM | POA: Diagnosis not present

## 2018-09-14 DIAGNOSIS — Z7984 Long term (current) use of oral hypoglycemic drugs: Secondary | ICD-10-CM | POA: Insufficient documentation

## 2018-09-14 DIAGNOSIS — Z79899 Other long term (current) drug therapy: Secondary | ICD-10-CM | POA: Diagnosis not present

## 2018-09-14 DIAGNOSIS — Z95828 Presence of other vascular implants and grafts: Secondary | ICD-10-CM

## 2018-09-14 LAB — CBC WITH DIFFERENTIAL (CANCER CENTER ONLY)
Abs Immature Granulocytes: 0.02 10*3/uL (ref 0.00–0.07)
Basophils Absolute: 0.1 10*3/uL (ref 0.0–0.1)
Basophils Relative: 1 %
Eosinophils Absolute: 0 10*3/uL (ref 0.0–0.5)
Eosinophils Relative: 1 %
HCT: 37.6 % (ref 36.0–46.0)
Hemoglobin: 11.7 g/dL — ABNORMAL LOW (ref 12.0–15.0)
Immature Granulocytes: 0 %
Lymphocytes Relative: 24 %
Lymphs Abs: 1.4 10*3/uL (ref 0.7–4.0)
MCH: 29.1 pg (ref 26.0–34.0)
MCHC: 31.1 g/dL (ref 30.0–36.0)
MCV: 93.5 fL (ref 80.0–100.0)
Monocytes Absolute: 0.4 10*3/uL (ref 0.1–1.0)
Monocytes Relative: 7 %
Neutro Abs: 4 10*3/uL (ref 1.7–7.7)
Neutrophils Relative %: 67 %
Platelet Count: 236 10*3/uL (ref 150–400)
RBC: 4.02 MIL/uL (ref 3.87–5.11)
RDW: 13.9 % (ref 11.5–15.5)
WBC Count: 5.9 10*3/uL (ref 4.0–10.5)
nRBC: 0 % (ref 0.0–0.2)

## 2018-09-14 LAB — CMP (CANCER CENTER ONLY)
ALT: 8 U/L (ref 0–44)
AST: 15 U/L (ref 15–41)
Albumin: 3.6 g/dL (ref 3.5–5.0)
Alkaline Phosphatase: 67 U/L (ref 38–126)
Anion gap: 10 (ref 5–15)
BUN: 10 mg/dL (ref 8–23)
CO2: 25 mmol/L (ref 22–32)
Calcium: 8.6 mg/dL — ABNORMAL LOW (ref 8.9–10.3)
Chloride: 104 mmol/L (ref 98–111)
Creatinine: 0.63 mg/dL (ref 0.44–1.00)
GFR, Est AFR Am: >60 mL/min (ref >60)
GFR, Estimated: >60 mL/min (ref >60)
Glucose, Bld: 113 mg/dL — ABNORMAL HIGH (ref 70–99)
Potassium: 3.7 mmol/L (ref 3.5–5.1)
Sodium: 139 mmol/L (ref 135–145)
Total Bilirubin: 0.2 mg/dL — ABNORMAL LOW (ref 0.3–1.2)
Total Protein: 6.6 g/dL (ref 6.5–8.1)

## 2018-09-14 MED ORDER — SODIUM CHLORIDE 0.9 % IV SOLN
Freq: Once | INTRAVENOUS | Status: AC
  Administered 2018-09-14: 13:00:00 via INTRAVENOUS
  Filled 2018-09-14: qty 250

## 2018-09-14 MED ORDER — SODIUM CHLORIDE 0.9% FLUSH
10.0000 mL | INTRAVENOUS | Status: DC | PRN
  Administered 2018-09-14: 10 mL
  Filled 2018-09-14: qty 10

## 2018-09-14 MED ORDER — TRASTUZUMAB CHEMO 150 MG IV SOLR
150.0000 mg | Freq: Once | INTRAVENOUS | Status: AC
  Administered 2018-09-14: 150 mg via INTRAVENOUS
  Filled 2018-09-14: qty 7.14

## 2018-09-14 MED ORDER — HEPARIN SOD (PORK) LOCK FLUSH 100 UNIT/ML IV SOLN
500.0000 [IU] | Freq: Once | INTRAVENOUS | Status: AC | PRN
  Administered 2018-09-14: 500 [IU]
  Filled 2018-09-14: qty 5

## 2018-09-14 MED ORDER — DEXAMETHASONE SODIUM PHOSPHATE 10 MG/ML IJ SOLN
INTRAMUSCULAR | Status: AC
  Filled 2018-09-14: qty 1

## 2018-09-14 MED ORDER — ACETAMINOPHEN 325 MG PO TABS
650.0000 mg | ORAL_TABLET | Freq: Once | ORAL | Status: AC
  Administered 2018-09-14: 650 mg via ORAL

## 2018-09-14 MED ORDER — FAMOTIDINE IN NACL 20-0.9 MG/50ML-% IV SOLN
20.0000 mg | Freq: Once | INTRAVENOUS | Status: AC
  Administered 2018-09-14: 20 mg via INTRAVENOUS

## 2018-09-14 MED ORDER — DIPHENHYDRAMINE HCL 50 MG/ML IJ SOLN
INTRAMUSCULAR | Status: AC
  Filled 2018-09-14: qty 1

## 2018-09-14 MED ORDER — FAMOTIDINE IN NACL 20-0.9 MG/50ML-% IV SOLN
INTRAVENOUS | Status: AC
  Filled 2018-09-14: qty 50

## 2018-09-14 MED ORDER — SODIUM CHLORIDE 0.9% FLUSH
10.0000 mL | Freq: Once | INTRAVENOUS | Status: AC
  Administered 2018-09-14: 10 mL
  Filled 2018-09-14: qty 10

## 2018-09-14 MED ORDER — DIPHENHYDRAMINE HCL 50 MG/ML IJ SOLN
25.0000 mg | Freq: Once | INTRAMUSCULAR | Status: AC
  Administered 2018-09-14: 25 mg via INTRAVENOUS

## 2018-09-14 MED ORDER — SODIUM CHLORIDE 0.9 % IV SOLN
80.0000 mg/m2 | Freq: Once | INTRAVENOUS | Status: AC
  Administered 2018-09-14: 162 mg via INTRAVENOUS
  Filled 2018-09-14: qty 27

## 2018-09-14 MED ORDER — DEXAMETHASONE SODIUM PHOSPHATE 10 MG/ML IJ SOLN
10.0000 mg | Freq: Once | INTRAMUSCULAR | Status: AC
  Administered 2018-09-14: 10 mg via INTRAVENOUS

## 2018-09-14 MED ORDER — ACETAMINOPHEN 325 MG PO TABS
ORAL_TABLET | ORAL | Status: AC
  Filled 2018-09-14: qty 2

## 2018-09-14 NOTE — Patient Instructions (Signed)
[place] Discharge Instructions for Patients Receiving Chemotherapy  Today you received the following chemotherapy agents Herceptin, Taxol  To help prevent nausea and vomiting after your treatment, we encourage you to take your nausea medication: As directed by MD   If you develop nausea and vomiting that is not controlled by your nausea medication, call the clinic.   BELOW ARE SYMPTOMS THAT SHOULD BE REPORTED IMMEDIATELY:  *FEVER GREATER THAN 100.5 F  *CHILLS WITH OR WITHOUT FEVER  NAUSEA AND VOMITING THAT IS NOT CONTROLLED WITH YOUR NAUSEA MEDICATION  *UNUSUAL SHORTNESS OF BREATH  *UNUSUAL BRUISING OR BLEEDING  TENDERNESS IN MOUTH AND THROAT WITH OR WITHOUT PRESENCE OF ULCERS  *URINARY PROBLEMS  *BOWEL PROBLEMS  UNUSUAL RASH Items with * indicate a potential emergency and should be followed up as soon as possible.  Feel free to call the clinic should you have any questions or concerns. The clinic phone number is [phone].  Please show the [facility] at check-in to the Emergency Department and triage nurse.   Coronavirus (COVID-19) Are you at risk?  Are you at risk for the Coronavirus (COVID-19)?  To be considered HIGH RISK for Coronavirus (COVID-19), you have to meet the following criteria:  . Traveled to [place], [place], [place], [place] or [place]; or in the [place] to [place], [place], [place], or [place]; and have fever, cough, and shortness of breath within the last 2 weeks of travel OR . Been in close contact with a person diagnosed with COVID-19 within the last 2 weeks and have fever, cough, and shortness of breath . IF YOU DO NOT MEET THESE CRITERIA, YOU ARE CONSIDERED LOW RISK FOR COVID-19.  What to do if you are HIGH RISK for COVID-19?  Marland Kitchen If you are having a medical emergency, call 911. . Seek medical care right away. Before you go to a doctor's office, urgent care or emergency department, call ahead and tell them  about your recent travel, contact with someone diagnosed with COVID-19, and your symptoms. You should receive instructions from your physician's office regarding next steps of care.  . When you arrive at healthcare provider, tell the healthcare staff immediately you have returned from visiting [place], [place], [place], [place] or [place]; or traveled in the [place] to [place], [place], [place], or [place]; in the last two weeks or you have been in close contact with a person diagnosed with COVID-19 in the last 2 weeks.   . Tell the health care staff about your symptoms: fever, cough and shortness of breath. . After you have been seen by a medical provider, you will be either: o Tested for (COVID-19) and discharged home on quarantine except to seek medical care if symptoms worsen, and asked to  - Stay home and avoid contact with others until you get your results (4-5 days)  - Avoid travel on public transportation if possible (such as bus, train, or airplane) or o Sent to the Emergency Department by EMS for evaluation, COVID-19 testing, and possible admission depending on your condition and test results.  What to do if you are LOW RISK for COVID-19?  Reduce your risk of any infection by using the same precautions used for avoiding the common cold or flu:  Marland Kitchen Wash your hands often with soap and warm water for at least 20 seconds.  If soap and water are not readily available, use an alcohol-based hand sanitizer with at least 60% alcohol.  Marland Kitchen  If coughing or sneezing, cover your mouth and nose by coughing or sneezing into the elbow areas of your shirt or coat, into a tissue or into your sleeve (not your hands). . Avoid shaking hands with others and consider head nods or verbal greetings only. . Avoid touching your eyes, nose, or mouth with unwashed hands.  . Avoid close contact with people who are sick. . Avoid places or events with large numbers of people in one location, like concerts or  sporting events. . Carefully consider travel plans you have or are making. . If you are planning any travel outside or inside the [place], visit the CDC's Travelers' Health webpage for the latest health notices. . If you have some symptoms but not all symptoms, continue to monitor at home and seek medical attention if your symptoms worsen. . If you are having a medical emergency, call 911.   [facility] / e-Visit: [email or web address]         [facility]: [facility]: [phone]                   [facility]: [phone]

## 2018-09-20 NOTE — Progress Notes (Signed)
 Patient Care Team: Slatosky, John J., MD as PCP - General (Family Medicine) Stuart, Dawn C, RN as Oncology Nurse Navigator Martini, Keisha N, RN as Oncology Nurse Navigator Cornett, Thomas, MD as Consulting Physician (General Surgery) Gudena, Vinay, MD as Consulting Physician (Hematology and Oncology) Squire, Sarah, MD as Attending Physician (Radiation Oncology)  DIAGNOSIS:    ICD-10-CM   1. Malignant neoplasm of upper-outer quadrant of right breast in female, estrogen receptor positive (HCC)  C50.411    Z17.0     SUMMARY OF ONCOLOGIC HISTORY: Oncology History  Malignant neoplasm of upper-outer quadrant of right breast in female, estrogen receptor positive (HCC)  06/19/2018 Initial Diagnosis   Screening mammogram detected right breast mass at 9 o'clock position 3 cm from the nipple, ultrasound revealed 7 x 5 x 5 mm mass, axillary ultrasound normal-appearing lymph nodes, ultrasound biopsy: Grade 1 IDC ER 100%, PR 10%, Ki-67 10%, HER-2 3+ positive, T1b N0 stage Ia   06/28/2018 Cancer Staging   Staging form: Breast, AJCC 8th Edition - Clinical stage from 06/28/2018: Stage IA (cT1b, cN0, cM0, G1, ER+, PR+, HER2+) - Signed by Gudena, Vinay, MD on 06/28/2018   07/05/2018 Genetic Testing   Negative genetic testing on the common hereditary cancer panel.  The Hereditary Gene Panel offered by [facility] includes sequencing and/or deletion duplication testing of the following 48 genes: APC, ATM, AXIN2, BARD1, BMPR1A, BRCA1, BRCA2, BRIP1, CDH1, CDK4, CDKN2A (p14ARF), CDKN2A (p16INK4a), CHEK2, CTNNA1, DICER1, EPCAM (Deletion/duplication testing only), GREM1 (promoter region deletion/duplication testing only), KIT, MEN1, MLH1, MSH2, MSH3, MSH6, MUTYH, NBN, NF1, NHTL1, PALB2, PDGFRA, PMS2, POLD1, POLE, PTEN, RAD50, RAD51C, RAD51D, RNF43, SDHB, SDHC, SDHD, SMAD4, SMARCA4. STK11, TP53, TSC1, TSC2, and VHL.  The following genes were evaluated for sequence changes only: SDHA and HOXB13 c.251G>A variant only. The  report date is July 05, 2018.   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    08/17/2018 -  Chemotherapy   The patient had trastuzumab (HERCEPTIN) 300 mg in sodium chloride 0.9 % 250 mL chemo infusion, 315 mg, Intravenous,  Once, 2 of 16 cycles Administration: 300 mg (08/17/2018), 150 mg (09/14/2018), 150 mg (08/31/2018), 150 mg (09/07/2018), 150 mg (08/24/2018) PACLitaxel (TAXOL) 162 mg in sodium chloride 0.9 % 250 mL chemo infusion (</= 80mg/m2), 80 mg/m2 = 162 mg, Intravenous,  Once, 2 of 3 cycles Administration: 162 mg (08/17/2018), 162 mg (08/24/2018), 162 mg (09/14/2018), 162 mg (08/31/2018), 162 mg (09/07/2018)  for chemotherapy treatment.      CHIEF COMPLIANT: Cycle 6 Taxol and Herceptin  INTERVAL HISTORY: Sabrina [name] is a 67 y.o. with above-mentioned history of right breast cancer who underwent a lumpectomy and is currently on adjuvant chemotherapy with weekly Taxol and Herceptin. She presents to the clinic todayfor cycle 6. Her biggest issue has been diarrhea for which she has been on Imodium initially which was not controlling it.  I put her on Lomotil twice a day scheduled every day.  Because of this the diarrhea is much better.  She occasionally once every 3 days gets 3-4 loose stools.  She has not been taking Imodium when she gets loose stools.  She has felt occasional palpitations but no irregular heart rate issues.  REVIEW OF SYSTEMS:   Constitutional: Denies fevers, chills or abnormal weight loss Eyes: Denies blurriness of vision Ears, nose, mouth, throat, and face: Denies mucositis or sore throat Respiratory: Denies cough, dyspnea or   wheezes Cardiovascular: Palpitations intermittently Gastrointestinal: Diarrhea as above Skin: Denies abnormal skin rashes Lymphatics: Denies new lymphadenopathy or easy bruising Neurological: Denies numbness, tingling or new weaknesses  Behavioral/Psych: Mood is stable, no new changes  Extremities: No lower extremity edema Breast: denies any pain or lumps or nodules in either breasts All other systems were reviewed with the patient and are negative.  I have reviewed the past medical history, past surgical history, social history and family history with the patient and they are unchanged from previous note.  ALLERGIES:  is allergic to atorvastatin and meperidine.  MEDICATIONS:  Current Outpatient Medications  Medication Sig Dispense Refill  . digoxin (LANOXIN) 0.125 MG tablet Take 1 tablet (125 mcg total) by mouth daily. 90 tablet 1  . diphenoxylate-atropine (LOMOTIL) 2.5-0.025 MG tablet Take 1 tablet by mouth 2 (two) times daily as needed for diarrhea or loose stools. 60 tablet 1  . escitalopram (LEXAPRO) 10 MG tablet Take 1 tablet by mouth daily.    . flecainide (TAMBOCOR) 100 MG tablet Take 1 tablet (100 mg total) by mouth 2 (two) times daily. 180 tablet 1  . ibuprofen (ADVIL,MOTRIN) 800 MG tablet Take 1 tablet (800 mg total) by mouth every 8 (eight) hours as needed. 30 tablet 0  . levothyroxine (SYNTHROID, LEVOTHROID) 100 MCG tablet Take 1 tablet by mouth daily.    . lidocaine-prilocaine (EMLA) cream Apply to affected area once 30 g 3  . lisinopril (PRINIVIL,ZESTRIL) 10 MG tablet Take 1 tablet (10 mg total) by mouth daily. 90 tablet 3  . metFORMIN (GLUCOPHAGE) 500 MG tablet Take 1 tablet by mouth 2 (two) times daily with a meal.     . metoprolol succinate (TOPROL-XL) 50 MG 24 hr tablet Take 1 tablet (50 mg total) by mouth daily. 90 tablet 3  . ondansetron (ZOFRAN) 8 MG tablet Take 1 tablet (8 mg total) by mouth 2 (two) times daily as needed (Nausea or vomiting). 30 tablet 1  . prochlorperazine (COMPAZINE) 10 MG tablet Take 1 tablet (10 mg total) by mouth every 6 (six) hours as needed (Nausea or vomiting). 30 tablet 1  . warfarin (COUMADIN) 10 MG tablet Take as directed by [facility] 90 tablet 0   No current  facility-administered medications for this visit.     PHYSICAL EXAMINATION: ECOG PERFORMANCE STATUS: 1 - Symptomatic but completely ambulatory  Vitals:   09/21/18 1040  BP: 129/70  Pulse: 89  Resp: 18  Temp: 98.6 F (37 C)  SpO2: 99%   Filed Weights   09/21/18 1040  Weight: 180 lb 9.6 oz (81.9 kg)    Physical exam not performed due to COVID-19 precautions. LABORATORY DATA:  I have reviewed the data as listed CMP Latest Ref Rng & Units 09/14/2018 09/07/2018 08/31/2018  Glucose 70 - 99 mg/dL 113(H) 155(H) 127(H)  BUN 8 - 23 mg/dL 10 11 10  Creatinine 0.44 - 1.00 mg/dL 0.63 0.66 0.65  Sodium 135 - 145 mmol/L 139 139 140  Potassium 3.5 - 5.1 mmol/L 3.7 4.1 4.0  Chloride 98 - 111 mmol/L 104 105 104  CO2 22 - 32 mmol/L 25 27 29  Calcium 8.9 - 10.3 mg/dL 8.6(L) 8.9 8.7(L)  Total Protein 6.5 - 8.1 g/dL 6.6 6.3(L) 6.5  Total Bilirubin 0.3 - 1.2 mg/dL 0.2(L) 0.2(L) 0.2(L)  Alkaline Phos 38 - 126 U/L 67 65 74  AST 15 - 41 U/L 15 13(L) 13(L)  ALT 0 - 44 U/L 8 13 13    Lab Results  Component Value   Date   WBC 4.9 09/21/2018   HGB 11.0 (L) 09/21/2018   HCT 35.3 (L) 09/21/2018   MCV 92.2 09/21/2018   PLT 211 09/21/2018   NEUTROABS 3.0 09/21/2018    ASSESSMENT & PLAN:  Malignant neoplasm of upper-outer quadrant of right breast in female, estrogen receptor positive ([place]) 07/13/2018:Right lumpectomy ([facility]): IDC with DCIS, 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 Treatment plan: 1.Adj chemo with Taxol-Herceptin weekly X 12 and then q 3 weeks Herceptin. 2.Adjuvant radiation therapy 3.Followed by adjuvant antiestrogen therapy ----------------------------------------------------------------------------------------------------------------------------------------------- Current treatment: Cycle6Taxol Herceptin Chemo toxicities: 1.  Profound diarrhea: On Imodium.  Patient is scheduling Lomotil twice a day.  I instructed her to  take Imodium when she has a loose stool on top of Lomotil.  She was not doing that and was having occasional 2-3 loose stools per day. 2.  Fatigue as a result of diarrhea 3.  Hair thinning 4.  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  Labs reviewed Closely monitoring for chemo toxicities Return to clinic weekly for Taxol Herceptin's and every other week for follow-up with me.    No orders of the defined types were placed in this encounter.  The patient has a good understanding of the overall plan. she agrees with it. she will call with any problems that may develop before the next visit here.  Nicholas Lose, MD 09/21/2018  Julious Oka Dorshimer am acting as scribe for Dr. Nicholas Lose.  I have reviewed the above documentation for accuracy and completeness, and I agree with the above.

## 2018-09-21 ENCOUNTER — Inpatient Hospital Stay: Payer: Medicare Other

## 2018-09-21 ENCOUNTER — Encounter: Payer: Self-pay | Admitting: *Deleted

## 2018-09-21 ENCOUNTER — Inpatient Hospital Stay (HOSPITAL_BASED_OUTPATIENT_CLINIC_OR_DEPARTMENT_OTHER): Payer: Medicare Other | Admitting: Hematology and Oncology

## 2018-09-21 ENCOUNTER — Other Ambulatory Visit: Payer: Self-pay

## 2018-09-21 DIAGNOSIS — D6481 Anemia due to antineoplastic chemotherapy: Secondary | ICD-10-CM

## 2018-09-21 DIAGNOSIS — Z79899 Other long term (current) drug therapy: Secondary | ICD-10-CM

## 2018-09-21 DIAGNOSIS — C50411 Malignant neoplasm of upper-outer quadrant of right female breast: Secondary | ICD-10-CM

## 2018-09-21 DIAGNOSIS — R197 Diarrhea, unspecified: Secondary | ICD-10-CM | POA: Diagnosis not present

## 2018-09-21 DIAGNOSIS — Z170 Estrogen receptor positive status [ER+]: Secondary | ICD-10-CM | POA: Diagnosis not present

## 2018-09-21 DIAGNOSIS — Z5112 Encounter for antineoplastic immunotherapy: Secondary | ICD-10-CM | POA: Diagnosis not present

## 2018-09-21 DIAGNOSIS — Z95828 Presence of other vascular implants and grafts: Secondary | ICD-10-CM

## 2018-09-21 LAB — CMP (CANCER CENTER ONLY)
ALT: 12 U/L (ref 0–44)
AST: 14 U/L — ABNORMAL LOW (ref 15–41)
Albumin: 3.5 g/dL (ref 3.5–5.0)
Alkaline Phosphatase: 70 U/L (ref 38–126)
Anion gap: 9 (ref 5–15)
BUN: 7 mg/dL — ABNORMAL LOW (ref 8–23)
CO2: 28 mmol/L (ref 22–32)
Calcium: 8.9 mg/dL (ref 8.9–10.3)
Chloride: 104 mmol/L (ref 98–111)
Creatinine: 0.66 mg/dL (ref 0.44–1.00)
GFR, Est AFR Am: >60 mL/min (ref >60)
GFR, Estimated: >60 mL/min (ref >60)
Glucose, Bld: 145 mg/dL — ABNORMAL HIGH (ref 70–99)
Potassium: 3.1 mmol/L — ABNORMAL LOW (ref 3.5–5.1)
Sodium: 141 mmol/L (ref 135–145)
Total Bilirubin: <0.2 mg/dL — ABNORMAL LOW (ref 0.3–1.2)
Total Protein: 6.4 g/dL — ABNORMAL LOW (ref 6.5–8.1)

## 2018-09-21 LAB — CBC WITH DIFFERENTIAL (CANCER CENTER ONLY)
Abs Immature Granulocytes: 0.02 10*3/uL (ref 0.00–0.07)
Basophils Absolute: 0.1 10*3/uL (ref 0.0–0.1)
Basophils Relative: 1 %
Eosinophils Absolute: 0.1 10*3/uL (ref 0.0–0.5)
Eosinophils Relative: 2 %
HCT: 35.3 % — ABNORMAL LOW (ref 36.0–46.0)
Hemoglobin: 11 g/dL — ABNORMAL LOW (ref 12.0–15.0)
Immature Granulocytes: 0 %
Lymphocytes Relative: 30 %
Lymphs Abs: 1.5 10*3/uL (ref 0.7–4.0)
MCH: 28.7 pg (ref 26.0–34.0)
MCHC: 31.2 g/dL (ref 30.0–36.0)
MCV: 92.2 fL (ref 80.0–100.0)
Monocytes Absolute: 0.3 10*3/uL (ref 0.1–1.0)
Monocytes Relative: 7 %
Neutro Abs: 3 10*3/uL (ref 1.7–7.7)
Neutrophils Relative %: 60 %
Platelet Count: 211 10*3/uL (ref 150–400)
RBC: 3.83 MIL/uL — ABNORMAL LOW (ref 3.87–5.11)
RDW: 14.5 % (ref 11.5–15.5)
WBC Count: 4.9 10*3/uL (ref 4.0–10.5)
nRBC: 0 % (ref 0.0–0.2)

## 2018-09-21 MED ORDER — SODIUM CHLORIDE 0.9 % IV SOLN
80.0000 mg/m2 | Freq: Once | INTRAVENOUS | Status: AC
  Administered 2018-09-21: 13:00:00 162 mg via INTRAVENOUS
  Filled 2018-09-21: qty 27

## 2018-09-21 MED ORDER — HEPARIN SOD (PORK) LOCK FLUSH 100 UNIT/ML IV SOLN
500.0000 [IU] | Freq: Once | INTRAVENOUS | Status: AC | PRN
  Administered 2018-09-21: 500 [IU]
  Filled 2018-09-21: qty 5

## 2018-09-21 MED ORDER — ACETAMINOPHEN 325 MG PO TABS
ORAL_TABLET | ORAL | Status: AC
  Filled 2018-09-21: qty 2

## 2018-09-21 MED ORDER — SODIUM CHLORIDE 0.9% FLUSH
10.0000 mL | INTRAVENOUS | Status: DC | PRN
  Administered 2018-09-21: 14:00:00 10 mL
  Filled 2018-09-21: qty 10

## 2018-09-21 MED ORDER — FAMOTIDINE IN NACL 20-0.9 MG/50ML-% IV SOLN
INTRAVENOUS | Status: AC
  Filled 2018-09-21: qty 50

## 2018-09-21 MED ORDER — FAMOTIDINE IN NACL 20-0.9 MG/50ML-% IV SOLN
20.0000 mg | Freq: Once | INTRAVENOUS | Status: AC
  Administered 2018-09-21: 20 mg via INTRAVENOUS

## 2018-09-21 MED ORDER — SODIUM CHLORIDE 0.9% FLUSH
10.0000 mL | Freq: Once | INTRAVENOUS | Status: AC
  Administered 2018-09-21: 10:00:00 10 mL
  Filled 2018-09-21: qty 10

## 2018-09-21 MED ORDER — DEXAMETHASONE SODIUM PHOSPHATE 10 MG/ML IJ SOLN
INTRAMUSCULAR | Status: AC
  Filled 2018-09-21: qty 1

## 2018-09-21 MED ORDER — DIPHENHYDRAMINE HCL 50 MG/ML IJ SOLN
INTRAMUSCULAR | Status: AC
  Filled 2018-09-21: qty 1

## 2018-09-21 MED ORDER — TRASTUZUMAB CHEMO 150 MG IV SOLR
150.0000 mg | Freq: Once | INTRAVENOUS | Status: AC
  Administered 2018-09-21: 13:00:00 150 mg via INTRAVENOUS
  Filled 2018-09-21: qty 7.14

## 2018-09-21 MED ORDER — DEXAMETHASONE SODIUM PHOSPHATE 10 MG/ML IJ SOLN
10.0000 mg | Freq: Once | INTRAMUSCULAR | Status: AC
  Administered 2018-09-21: 10 mg via INTRAVENOUS

## 2018-09-21 MED ORDER — SODIUM CHLORIDE 0.9 % IV SOLN
Freq: Once | INTRAVENOUS | Status: AC
  Administered 2018-09-21: 12:00:00 via INTRAVENOUS
  Filled 2018-09-21: qty 250

## 2018-09-21 MED ORDER — ACETAMINOPHEN 325 MG PO TABS
650.0000 mg | ORAL_TABLET | Freq: Once | ORAL | Status: AC
  Administered 2018-09-21: 12:00:00 650 mg via ORAL

## 2018-09-21 MED ORDER — DIPHENHYDRAMINE HCL 50 MG/ML IJ SOLN
25.0000 mg | Freq: Once | INTRAMUSCULAR | Status: AC
  Administered 2018-09-21: 12:00:00 25 mg via INTRAVENOUS

## 2018-09-21 NOTE — Patient Instructions (Addendum)
[facility] Discharge Instructions for Patients Receiving Chemotherapy  Today you received the following chemotherapy agents Herceptin, Taxol  To help prevent nausea and vomiting after your treatment, we encourage you to take your nausea medication: As directed by MD   If you develop nausea and vomiting that is not controlled by your nausea medication, call the clinic.   BELOW ARE SYMPTOMS THAT SHOULD BE REPORTED IMMEDIATELY:  *FEVER GREATER THAN 100.5 F  *CHILLS WITH OR WITHOUT FEVER  NAUSEA AND VOMITING THAT IS NOT CONTROLLED WITH YOUR NAUSEA MEDICATION  *UNUSUAL SHORTNESS OF BREATH  *UNUSUAL BRUISING OR BLEEDING  TENDERNESS IN MOUTH AND THROAT WITH OR WITHOUT PRESENCE OF ULCERS  *URINARY PROBLEMS  *BOWEL PROBLEMS  UNUSUAL RASH Items with * indicate a potential emergency and should be followed up as soon as possible.  Feel free to call the clinic should you have any questions or concerns. The clinic phone number is [phone].  Please show the [facility] at check-in to the Emergency Department and triage nurse.   Coronavirus (COVID-19) Are you at risk?  Are you at risk for the Coronavirus (COVID-19)?  To be considered HIGH RISK for Coronavirus (COVID-19), you have to meet the following criteria:  . Traveled to [place], [place], [place], [place] or [place]; or in the [place] to [place], [place], [place], or [place]; and have fever, cough, and shortness of breath within the last 2 weeks of travel OR . Been in close contact with a person diagnosed with COVID-19 within the last 2 weeks and have fever, cough, and shortness of breath . IF YOU DO NOT MEET THESE CRITERIA, YOU ARE CONSIDERED LOW RISK FOR COVID-19.  What to do if you are HIGH RISK for COVID-19?  Marland Kitchen If you are having a medical emergency, call 911. . Seek medical care right away. Before you go to a doctor's office, urgent care or emergency department, call ahead and tell them  about your recent travel, contact with someone diagnosed with COVID-19, and your symptoms. You should receive instructions from your physician's office regarding next steps of care.  . When you arrive at healthcare provider, tell the healthcare staff immediately you have returned from visiting [place], [place], [place], [place] or [place]; or traveled in the [place] to [place], [place], [place], or [place]; in the last two weeks or you have been in close contact with a person diagnosed with COVID-19 in the last 2 weeks.   . Tell the health care staff about your symptoms: fever, cough and shortness of breath. . After you have been seen by a medical provider, you will be either: o Tested for (COVID-19) and discharged home on quarantine except to seek medical care if symptoms worsen, and asked to  - Stay home and avoid contact with others until you get your results (4-5 days)  - Avoid travel on public transportation if possible (such as bus, train, or airplane) or o Sent to the Emergency Department by EMS for evaluation, COVID-19 testing, and possible admission depending on your condition and test results.  What to do if you are LOW RISK for COVID-19?  Reduce your risk of any infection by using the same precautions used for avoiding the common cold or flu:  Marland Kitchen Wash your hands often with soap and warm water for at least 20 seconds.  If soap and water are not readily available, use an alcohol-based hand sanitizer with at least 60% alcohol.  Marland Kitchen  If coughing or sneezing, cover your mouth and nose by coughing or sneezing into the elbow areas of your shirt or coat, into a tissue or into your sleeve (not your hands). . Avoid shaking hands with others and consider head nods or verbal greetings only. . Avoid touching your eyes, nose, or mouth with unwashed hands.  . Avoid close contact with people who are sick. . Avoid places or events with large numbers of people in one location, like concerts or  sporting events. . Carefully consider travel plans you have or are making. . If you are planning any travel outside or inside the [place], visit the CDC's Travelers' Health webpage for the latest health notices. . If you have some symptoms but not all symptoms, continue to monitor at home and seek medical attention if your symptoms worsen. . If you are having a medical emergency, call 911.   [facility] / e-Visit: [email or web address]         [facility]: [phone]  [facility]: [phone]                   [facility]: [phone]       Hypokalemia Hypokalemia means that the amount of potassium in the blood is lower than normal.Potassium is a chemical that helps regulate the amount of fluid in the body (electrolyte). It also stimulates muscle tightening (contraction) and helps nerves work properly.Normally, most of the body's potassium is inside of cells, and only a very small amount is in the blood. Because the amount in the blood is so small, minor changes to potassium levels in the blood can be life-threatening. What are the causes? This condition may be caused by:  Antibiotic medicine.  Diarrhea or vomiting. Taking too much of a medicine that helps you have a bowel movement (laxative) can cause diarrhea and lead to hypokalemia.  Chronic kidney disease (CKD).  Medicines that help the body get rid of excess fluid (diuretics).  Eating disorders, such as bulimia.  Low magnesium levels in the body.  Sweating a lot. What are the signs or symptoms? Symptoms of this condition include:  Weakness.  Constipation.  Fatigue.  Muscle cramps.  Mental confusion.  Skipped heartbeats or irregular heartbeat (palpitations).  Tingling or numbness. How is this diagnosed? This condition is diagnosed with a blood test. How is this  treated? Hypokalemia can be treated by taking potassium supplements by mouth or adjusting the medicines that you take. Treatment may also include eating more foods that contain a lot of potassium. If your potassium level is very low, you may need to get potassium through an IV tube in one of your veins and be monitored in the hospital. Follow these instructions at home:   Take over-the-counter and prescription medicines only as told by your health care provider. This includes vitamins and supplements.  Eat a healthy diet. A healthy diet includes fresh fruits and vegetables, whole grains, healthy fats, and lean proteins.  If instructed, eat more foods that contain a lot of potassium, such as: ? Nuts, such as peanuts and pistachios. ? Seeds, such as sunflower seeds and pumpkin seeds. ? Peas, lentils, and lima beans. ? Whole grain and bran cereals and breads. ? Fresh fruits and vegetables, such as apricots, avocado, bananas, cantaloupe, kiwi, oranges, tomatoes, asparagus, and potatoes. ? Orange juice. ? Tomato juice. ? Red meats. ? Yogurt.  Keep all follow-up visits as told by your  health care provider. This is important. Contact a health care provider if:  You have weakness that gets worse.  You feel your heart pounding or racing.  You vomit.  You have diarrhea.  You have diabetes (diabetes mellitus) and you have trouble keeping your blood sugar (glucose) in your target range. Get help right away if:  You have chest pain.  You have shortness of breath.  You have vomiting or diarrhea that lasts for more than 2 days.  You faint. This information is not intended to replace advice given to you by your health care provider. Make sure you discuss any questions you have with your health care provider. Document Released: 03/29/2005 Document Revised: 11/15/2015 Document Reviewed: 11/15/2015 Elsevier Interactive Patient Education  2019 [employer].

## 2018-09-28 ENCOUNTER — Other Ambulatory Visit: Payer: Self-pay

## 2018-09-28 ENCOUNTER — Inpatient Hospital Stay: Payer: Medicare Other

## 2018-09-28 ENCOUNTER — Encounter: Payer: Self-pay | Admitting: *Deleted

## 2018-09-28 VITALS — BP 134/79 | HR 64 | Temp 98.5°F | Resp 20

## 2018-09-28 DIAGNOSIS — C50411 Malignant neoplasm of upper-outer quadrant of right female breast: Secondary | ICD-10-CM

## 2018-09-28 DIAGNOSIS — Z170 Estrogen receptor positive status [ER+]: Secondary | ICD-10-CM

## 2018-09-28 DIAGNOSIS — Z5112 Encounter for antineoplastic immunotherapy: Secondary | ICD-10-CM | POA: Diagnosis not present

## 2018-09-28 DIAGNOSIS — Z95828 Presence of other vascular implants and grafts: Secondary | ICD-10-CM

## 2018-09-28 LAB — CBC WITH DIFFERENTIAL (CANCER CENTER ONLY)
Abs Immature Granulocytes: 0.04 10*3/uL (ref 0.00–0.07)
Basophils Absolute: 0 10*3/uL (ref 0.0–0.1)
Basophils Relative: 1 %
Eosinophils Absolute: 0.1 10*3/uL (ref 0.0–0.5)
Eosinophils Relative: 1 %
HCT: 33.7 % — ABNORMAL LOW (ref 36.0–46.0)
Hemoglobin: 10.1 g/dL — ABNORMAL LOW (ref 12.0–15.0)
Immature Granulocytes: 1 %
Lymphocytes Relative: 24 %
Lymphs Abs: 1.2 10*3/uL (ref 0.7–4.0)
MCH: 28.5 pg (ref 26.0–34.0)
MCHC: 30 g/dL (ref 30.0–36.0)
MCV: 94.9 fL (ref 80.0–100.0)
Monocytes Absolute: 0.4 10*3/uL (ref 0.1–1.0)
Monocytes Relative: 8 %
Neutro Abs: 3.4 10*3/uL (ref 1.7–7.7)
Neutrophils Relative %: 65 %
Platelet Count: 196 10*3/uL (ref 150–400)
RBC: 3.55 MIL/uL — ABNORMAL LOW (ref 3.87–5.11)
RDW: 15 % (ref 11.5–15.5)
WBC Count: 5.2 10*3/uL (ref 4.0–10.5)
nRBC: 0 % (ref 0.0–0.2)

## 2018-09-28 LAB — CMP (CANCER CENTER ONLY)
ALT: 10 U/L (ref 0–44)
AST: 12 U/L — ABNORMAL LOW (ref 15–41)
Albumin: 3.4 g/dL — ABNORMAL LOW (ref 3.5–5.0)
Alkaline Phosphatase: 76 U/L (ref 38–126)
Anion gap: 7 (ref 5–15)
BUN: 6 mg/dL — ABNORMAL LOW (ref 8–23)
CO2: 31 mmol/L (ref 22–32)
Calcium: 8.5 mg/dL — ABNORMAL LOW (ref 8.9–10.3)
Chloride: 104 mmol/L (ref 98–111)
Creatinine: 0.58 mg/dL (ref 0.44–1.00)
GFR, Est AFR Am: >60 mL/min (ref >60)
GFR, Estimated: >60 mL/min (ref >60)
Glucose, Bld: 125 mg/dL — ABNORMAL HIGH (ref 70–99)
Potassium: 3.6 mmol/L (ref 3.5–5.1)
Sodium: 142 mmol/L (ref 135–145)
Total Bilirubin: 0.3 mg/dL (ref 0.3–1.2)
Total Protein: 6.2 g/dL — ABNORMAL LOW (ref 6.5–8.1)

## 2018-09-28 MED ORDER — ACETAMINOPHEN 325 MG PO TABS
ORAL_TABLET | ORAL | Status: AC
  Filled 2018-09-28: qty 2

## 2018-09-28 MED ORDER — DEXAMETHASONE SODIUM PHOSPHATE 10 MG/ML IJ SOLN
INTRAMUSCULAR | Status: AC
  Filled 2018-09-28: qty 1

## 2018-09-28 MED ORDER — SODIUM CHLORIDE 0.9 % IV SOLN
Freq: Once | INTRAVENOUS | Status: AC
  Administered 2018-09-28: 12:00:00 via INTRAVENOUS
  Filled 2018-09-28: qty 250

## 2018-09-28 MED ORDER — DEXAMETHASONE SODIUM PHOSPHATE 10 MG/ML IJ SOLN
10.0000 mg | Freq: Once | INTRAMUSCULAR | Status: AC
  Administered 2018-09-28: 13:00:00 10 mg via INTRAVENOUS

## 2018-09-28 MED ORDER — ACETAMINOPHEN 325 MG PO TABS
650.0000 mg | ORAL_TABLET | Freq: Once | ORAL | Status: AC
  Administered 2018-09-28: 650 mg via ORAL

## 2018-09-28 MED ORDER — FAMOTIDINE IN NACL 20-0.9 MG/50ML-% IV SOLN
20.0000 mg | Freq: Once | INTRAVENOUS | Status: AC
  Administered 2018-09-28: 20 mg via INTRAVENOUS

## 2018-09-28 MED ORDER — HEPARIN SOD (PORK) LOCK FLUSH 100 UNIT/ML IV SOLN
500.0000 [IU] | Freq: Once | INTRAVENOUS | Status: AC | PRN
  Administered 2018-09-28: 500 [IU]
  Filled 2018-09-28: qty 5

## 2018-09-28 MED ORDER — DIPHENHYDRAMINE HCL 50 MG/ML IJ SOLN
25.0000 mg | Freq: Once | INTRAMUSCULAR | Status: AC
  Administered 2018-09-28: 25 mg via INTRAVENOUS

## 2018-09-28 MED ORDER — SODIUM CHLORIDE 0.9% FLUSH
10.0000 mL | Freq: Once | INTRAVENOUS | Status: AC
  Administered 2018-09-28: 12:00:00 10 mL
  Filled 2018-09-28: qty 10

## 2018-09-28 MED ORDER — FAMOTIDINE IN NACL 20-0.9 MG/50ML-% IV SOLN
INTRAVENOUS | Status: AC
  Filled 2018-09-28: qty 50

## 2018-09-28 MED ORDER — SODIUM CHLORIDE 0.9 % IV SOLN
80.0000 mg/m2 | Freq: Once | INTRAVENOUS | Status: AC
  Administered 2018-09-28: 162 mg via INTRAVENOUS
  Filled 2018-09-28: qty 27

## 2018-09-28 MED ORDER — SODIUM CHLORIDE 0.9% FLUSH
10.0000 mL | INTRAVENOUS | Status: DC | PRN
  Administered 2018-09-28: 10 mL
  Filled 2018-09-28: qty 10

## 2018-09-28 MED ORDER — TRASTUZUMAB CHEMO 150 MG IV SOLR
2.0000 mg/kg | Freq: Once | INTRAVENOUS | Status: AC
  Administered 2018-09-28: 14:00:00 168 mg via INTRAVENOUS
  Filled 2018-09-28: qty 8

## 2018-09-28 MED ORDER — DIPHENHYDRAMINE HCL 50 MG/ML IJ SOLN
INTRAMUSCULAR | Status: AC
  Filled 2018-09-28: qty 1

## 2018-09-28 NOTE — Patient Instructions (Signed)
[facility] Discharge Instructions for Patients Receiving Chemotherapy  Today you received the following chemotherapy agents Trastuzumab (HERCEPTIN) & Paclitaxel (TAXOL).  To help prevent nausea and vomiting after your treatment, we encourage you to take your nausea medication as prescribed.   If you develop nausea and vomiting that is not controlled by your nausea medication, call the clinic.   BELOW ARE SYMPTOMS THAT SHOULD BE REPORTED IMMEDIATELY:  *FEVER GREATER THAN 100.5 F  *CHILLS WITH OR WITHOUT FEVER  NAUSEA AND VOMITING THAT IS NOT CONTROLLED WITH YOUR NAUSEA MEDICATION  *UNUSUAL SHORTNESS OF BREATH  *UNUSUAL BRUISING OR BLEEDING  TENDERNESS IN MOUTH AND THROAT WITH OR WITHOUT PRESENCE OF ULCERS  *URINARY PROBLEMS  *BOWEL PROBLEMS  UNUSUAL RASH Items with * indicate a potential emergency and should be followed up as soon as possible.  Feel free to call the clinic should you have any questions or concerns. The clinic phone number is [phone].  Please show the [facility] at check-in to the Emergency Department and triage nurse.  Coronavirus (COVID-19) Are you at risk?  Are you at risk for the Coronavirus (COVID-19)?  To be considered HIGH RISK for Coronavirus (COVID-19), you have to meet the following criteria:  . Traveled to [place], [place], [place], [place] or [place]; or in the [place] to [place], [place], [place], or [place]; and have fever, cough, and shortness of breath within the last 2 weeks of travel OR . Been in close contact with a person diagnosed with COVID-19 within the last 2 weeks and have fever, cough, and shortness of breath . IF YOU DO NOT MEET THESE CRITERIA, YOU ARE CONSIDERED LOW RISK FOR COVID-19.  What to do if you are HIGH RISK for COVID-19?  Marland Kitchen If you are having a medical emergency, call 911. . Seek medical care right away. Before you go to a doctor's office, urgent care or emergency department,  call ahead and tell them about your recent travel, contact with someone diagnosed with COVID-19, and your symptoms. You should receive instructions from your physician's office regarding next steps of care.  . When you arrive at healthcare provider, tell the healthcare staff immediately you have returned from visiting [place], [place], [place], [place] or [place]; or traveled in the [place] to [place], [place], [place], or [place]; in the last two weeks or you have been in close contact with a person diagnosed with COVID-19 in the last 2 weeks.   . Tell the health care staff about your symptoms: fever, cough and shortness of breath. . After you have been seen by a medical provider, you will be either: o Tested for (COVID-19) and discharged home on quarantine except to seek medical care if symptoms worsen, and asked to  - Stay home and avoid contact with others until you get your results (4-5 days)  - Avoid travel on public transportation if possible (such as bus, train, or airplane) or o Sent to the Emergency Department by EMS for evaluation, COVID-19 testing, and possible admission depending on your condition and test results.  What to do if you are LOW RISK for COVID-19?  Reduce your risk of any infection by using the same precautions used for avoiding the common cold or flu:  Marland Kitchen Wash your hands often with soap and warm water for at least 20 seconds.  If soap and water are not readily available, use an alcohol-based hand sanitizer with at least 60% alcohol.  Marland Kitchen  If coughing or sneezing, cover your mouth and nose by coughing or sneezing into the elbow areas of your shirt or coat, into a tissue or into your sleeve (not your hands). . Avoid shaking hands with others and consider head nods or verbal greetings only. . Avoid touching your eyes, nose, or mouth with unwashed hands.  . Avoid close contact with people who are sick. . Avoid places or events with large numbers of people in one  location, like concerts or sporting events. . Carefully consider travel plans you have or are making. . If you are planning any travel outside or inside the [place], visit the CDC's Travelers' Health webpage for the latest health notices. . If you have some symptoms but not all symptoms, continue to monitor at home and seek medical attention if your symptoms worsen. . If you are having a medical emergency, call 911.   [facility] / e-Visit: [email or web address]         [facility]: [facility]: [phone]                   [facility]: [phone]

## 2018-09-28 NOTE — Patient Instructions (Signed)

## 2018-09-29 ENCOUNTER — Encounter: Payer: Self-pay | Admitting: *Deleted

## 2018-09-29 ENCOUNTER — Telehealth: Payer: Self-pay | Admitting: *Deleted

## 2018-09-29 NOTE — Telephone Encounter (Addendum)
Pt was due for an INR check 09/28/2018. Called pt and made her aware she was due. Rescheduled her for  INR check 10/03/2018 at the coumadin clinic in [place].

## 2018-09-29 NOTE — Telephone Encounter (Signed)
This encounter was created in error - please disregard.

## 2018-10-03 ENCOUNTER — Other Ambulatory Visit: Payer: Self-pay

## 2018-10-03 ENCOUNTER — Ambulatory Visit (INDEPENDENT_AMBULATORY_CARE_PROVIDER_SITE_OTHER): Payer: Medicare Other | Admitting: *Deleted

## 2018-10-03 DIAGNOSIS — I480 Paroxysmal atrial fibrillation: Secondary | ICD-10-CM

## 2018-10-03 DIAGNOSIS — Z5181 Encounter for therapeutic drug level monitoring: Secondary | ICD-10-CM | POA: Diagnosis not present

## 2018-10-03 DIAGNOSIS — Z7901 Long term (current) use of anticoagulants: Secondary | ICD-10-CM

## 2018-10-03 LAB — POCT INR: INR: 3.3 — AB (ref 2.0–3.0)

## 2018-10-03 NOTE — Patient Instructions (Signed)
Description   Skip today's dose, then  Continue 10mg  daily except 5mg  on Mondays, Wednesdays and Saturdays. Continue eating 2 servings of leafy green vegetables each week. Recheck INR in 2 weeks with cancer treatment

## 2018-10-04 NOTE — Progress Notes (Signed)
Patient Care Team: Enid Skeens., MD as PCP - General (Family Medicine) Mauro Kaufmann, RN as Oncology Nurse Navigator Rockwell Germany, RN as Oncology Nurse Navigator Erroll Luna, MD as Consulting Physician (General Surgery) Nicholas Lose, MD as Consulting Physician (Hematology and Oncology) Eppie Gibson, MD as Attending Physician (Radiation Oncology)  DIAGNOSIS:    ICD-10-CM   1. Malignant neoplasm of upper-outer quadrant of right breast in female, estrogen receptor positive ([place] [facility])  C50.411    Z17.0     SUMMARY OF ONCOLOGIC HISTORY: Oncology History  Malignant neoplasm of upper-outer quadrant of right breast in female, estrogen receptor positive ([facility])  06/19/2018 Initial Diagnosis   Screening mammogram detected right breast mass at 9 o'clock position 3 cm from the nipple, ultrasound revealed 7 x 5 x 5 mm mass, axillary ultrasound normal-appearing lymph nodes, ultrasound biopsy: Grade 1 IDC ER 100%, PR 10%, Ki-67 10%, HER-2 3+ positive, T1b N0 stage Ia   06/28/2018 Cancer Staging   Staging form: Breast, AJCC 8th Edition - Clinical stage from 06/28/2018: Stage IA (cT1b, cN0, cM0, G1, ER+, PR+, HER2+) - Signed by Nicholas Lose, MD on 06/28/2018   07/05/2018 Genetic Testing   Negative genetic testing on the common hereditary cancer panel.  The Hereditary Gene Panel offered by [facility] includes sequencing and/or deletion duplication testing of the following 48 genes: APC, ATM, AXIN2, BARD1, BMPR1A, BRCA1, BRCA2, BRIP1, CDH1, CDK4, CDKN2A (p14ARF), CDKN2A (p16INK4a), CHEK2, CTNNA1, DICER1, EPCAM (Deletion/duplication testing only), GREM1 (promoter region deletion/duplication testing only), KIT, MEN1, MLH1, MSH2, MSH3, MSH6, MUTYH, NBN, NF1, NHTL1, PALB2, PDGFRA, PMS2, POLD1, POLE, PTEN, RAD50, RAD51C, RAD51D, RNF43, SDHB, SDHC, SDHD, SMAD4, SMARCA4. STK11, TP53, TSC1, TSC2, and VHL.  The following genes were evaluated for sequence changes only: SDHA and HOXB13 c.251G>A variant only. The  report date is July 05, 2018.   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    08/17/2018 -  Chemotherapy   The patient had trastuzumab (HERCEPTIN) 300 mg in sodium chloride 0.9 % 250 mL chemo infusion, 315 mg, Intravenous,  Once, 2 of 16 cycles Administration: 300 mg (08/17/2018), 150 mg (09/14/2018), 150 mg (08/31/2018), 150 mg (09/07/2018), 150 mg (09/21/2018), 168 mg (09/28/2018), 150 mg (08/24/2018) PACLitaxel (TAXOL) 162 mg in sodium chloride 0.9 % 250 mL chemo infusion (</= 38m/m2), 80 mg/m2 = 162 mg, Intravenous,  Once, 2 of 3 cycles Administration: 162 mg (08/17/2018), 162 mg (08/24/2018), 162 mg (09/14/2018), 162 mg (08/31/2018), 162 mg (09/07/2018), 162 mg (09/21/2018), 162 mg (09/28/2018)  for chemotherapy treatment.      CHIEF COMPLIANT: Cycle 8 Taxol Herceptin  INTERVAL HISTORY: Sabrina [name] a 68y.o. with above-mentioned history of right breast cancer who underwent a lumpectomy and is currently on adjuvant chemotherapy with weekly Taxol and Herceptin.She presents to the clinic todayforcycle 8.  REVIEW OF SYSTEMS:   Constitutional: Denies fevers, chills or abnormal weight loss Eyes: Denies blurriness of vision Ears, nose, mouth, throat, and face: Denies mucositis or sore throat Respiratory: Denies cough, dyspnea or wheezes Cardiovascular: Denies palpitation, chest discomfort Gastrointestinal: Denies nausea, heartburn or change in bowel habits Skin: Denies abnormal skin rashes Lymphatics: Denies new lymphadenopathy or easy bruising Neurological: Denies numbness, tingling or new weaknesses Behavioral/Psych: Mood is stable, no new changes  Extremities: No lower extremity edema Breast: denies any pain or lumps or nodules in either breasts All other systems were reviewed with the patient  and are negative.  I have reviewed the past medical history, past surgical history, social  history and family history with the patient and they are unchanged from previous note.  ALLERGIES:  is allergic to atorvastatin and meperidine.  MEDICATIONS:  Current Outpatient Medications  Medication Sig Dispense Refill   digoxin (LANOXIN) 0.125 MG tablet Take 1 tablet (125 mcg total) by mouth daily. 90 tablet 1   diphenoxylate-atropine (LOMOTIL) 2.5-0.025 MG tablet Take 1 tablet by mouth 2 (two) times daily as needed for diarrhea or loose stools. 60 tablet 1   escitalopram (LEXAPRO) 10 MG tablet Take 1 tablet by mouth daily.     flecainide (TAMBOCOR) 100 MG tablet Take 1 tablet (100 mg total) by mouth 2 (two) times daily. 180 tablet 1   ibuprofen (ADVIL,MOTRIN) 800 MG tablet Take 1 tablet (800 mg total) by mouth every 8 (eight) hours as needed. 30 tablet 0   levothyroxine (SYNTHROID, LEVOTHROID) 100 MCG tablet Take 1 tablet by mouth daily.     lidocaine-prilocaine (EMLA) cream Apply to affected area once 30 g 3   lisinopril (PRINIVIL,ZESTRIL) 10 MG tablet Take 1 tablet (10 mg total) by mouth daily. 90 tablet 3   metFORMIN (GLUCOPHAGE) 500 MG tablet Take 1 tablet by mouth 2 (two) times daily with a meal.      metoprolol succinate (TOPROL-XL) 50 MG 24 hr tablet Take 1 tablet (50 mg total) by mouth daily. 90 tablet 3   ondansetron (ZOFRAN) 8 MG tablet Take 1 tablet (8 mg total) by mouth 2 (two) times daily as needed (Nausea or vomiting). 30 tablet 1   prochlorperazine (COMPAZINE) 10 MG tablet Take 1 tablet (10 mg total) by mouth every 6 (six) hours as needed (Nausea or vomiting). 30 tablet 1   warfarin (COUMADIN) 10 MG tablet Take as directed by Coumadin Clinic 90 tablet 0   No current facility-administered medications for this visit.     PHYSICAL EXAMINATION: ECOG PERFORMANCE STATUS: 1 - Symptomatic but completely ambulatory  Vitals:   10/05/18 1011  BP: (!) 134/59  Pulse: 68  Resp: 17  Temp: (!) 97.3 F (36.3 C)  SpO2: 96%   Filed Weights   10/05/18 1011  Weight:  178 lb 4.8 oz (80.9 kg)    GENERAL: alert, no distress and comfortable SKIN: skin color, texture, turgor are normal, no rashes or significant lesions EYES: normal, Conjunctiva are pink and non-injected, sclera clear OROPHARYNX: no exudate, no erythema and lips, buccal mucosa, and tongue normal  NECK: supple, thyroid normal size, non-tender, without nodularity LYMPH: no palpable lymphadenopathy in the cervical, axillary or inguinal LUNGS: clear to auscultation and percussion with normal breathing effort HEART: regular rate & rhythm and no murmurs and no lower extremity edema ABDOMEN: abdomen soft, non-tender and normal bowel sounds MUSCULOSKELETAL: no cyanosis of digits and no clubbing  NEURO: alert & oriented x 3 with fluent speech, no focal motor/sensory deficits EXTREMITIES: No lower extremity edema  LABORATORY DATA:  I have reviewed the data as listed CMP Latest Ref Rng & Units 09/28/2018 09/21/2018 09/14/2018  Glucose 70 - 99 mg/dL 125(H) 145(H) 113(H)  BUN 8 - 23 mg/dL 6(L) 7(L) 10  Creatinine 0.44 - 1.00 mg/dL 0.58 0.66 0.63  Sodium 135 - 145 mmol/L 142 141 139  Potassium 3.5 - 5.1 mmol/L 3.6 3.1(L) 3.7  Chloride 98 - 111 mmol/L 104 104 104  CO2 22 - 32 mmol/L _0 Calcium 8.9 - 10.3 mg/dL 8.5(L) 8.9 8.6(L)  Total Protein 6.5 -  8.1 g/dL 6.2(L) 6.4(L) 6.6  Total Bilirubin 0.3 - 1.2 mg/dL 0.3 <0.2(L) 0.2(L)  Alkaline Phos 38 - 126 U/L 76 70 67  AST 15 - 41 U/L 12(L) 14(L) 15  ALT 0 - 44 U/L _0 Lab Results  Component Value Date   WBC 6.5 10/05/2018   HGB 11.4 (L) 10/05/2018   HCT 37.7 10/05/2018   MCV 94.5 10/05/2018   PLT 253 10/05/2018   NEUTROABS 4.2 10/05/2018    ASSESSMENT & PLAN:  Malignant neoplasm of upper-outer quadrant of right breast in female, estrogen receptor positive ([facility]) 07/13/2018:Right lumpectomy ([facility]): IDC with DCIS, 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 Treatment plan: 1.Adj chemo  with Taxol-Herceptin weekly X 12 and then q 3 weeks Herceptin. 2.Adjuvant radiation therapy 3.Followed by adjuvant antiestrogen therapy ----------------------------------------------------------------------------------------------------------------------------------------------- Current treatment: Cycle8Taxol Herceptin Chemo toxicities: 1.Profound diarrhea: On Imodium.  Patient is scheduling Lomotil twice a day.    Doing much better 2.Fatigue as a result of diarrhea 3.Hair thinning 4.  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 5.  Leg swelling: For few days and resolved 6.  Bruises on the arm: Probably due to Coumadin.  Her INR was elevated recently.  She will be more careful.  Labs reviewed Closely monitoring for chemo toxicities Return to clinic weekly for Taxol Herceptin's and every other week for follow-up with me.    No orders of the defined types were placed in this encounter.  The patient has a good understanding of the overall plan. she agrees with it. she will call with any problems that may develop before the next visit here.  Nicholas Lose, MD 10/05/2018  Sabrina [name] am acting as scribe for Dr. Nicholas Lose.  I have reviewed the above documentation for accuracy and completeness, and I agree with the above.

## 2018-10-05 ENCOUNTER — Inpatient Hospital Stay: Payer: Medicare Other

## 2018-10-05 ENCOUNTER — Inpatient Hospital Stay (HOSPITAL_BASED_OUTPATIENT_CLINIC_OR_DEPARTMENT_OTHER): Payer: Medicare Other | Admitting: Hematology and Oncology

## 2018-10-05 ENCOUNTER — Other Ambulatory Visit: Payer: Self-pay

## 2018-10-05 ENCOUNTER — Encounter: Payer: Self-pay | Admitting: *Deleted

## 2018-10-05 DIAGNOSIS — Z95828 Presence of other vascular implants and grafts: Secondary | ICD-10-CM

## 2018-10-05 DIAGNOSIS — C50411 Malignant neoplasm of upper-outer quadrant of right female breast: Secondary | ICD-10-CM

## 2018-10-05 DIAGNOSIS — Z5111 Encounter for antineoplastic chemotherapy: Secondary | ICD-10-CM | POA: Diagnosis not present

## 2018-10-05 DIAGNOSIS — D6481 Anemia due to antineoplastic chemotherapy: Secondary | ICD-10-CM

## 2018-10-05 DIAGNOSIS — Z79899 Other long term (current) drug therapy: Secondary | ICD-10-CM

## 2018-10-05 DIAGNOSIS — T451X5A Adverse effect of antineoplastic and immunosuppressive drugs, initial encounter: Secondary | ICD-10-CM

## 2018-10-05 DIAGNOSIS — R197 Diarrhea, unspecified: Secondary | ICD-10-CM

## 2018-10-05 DIAGNOSIS — Z923 Personal history of irradiation: Secondary | ICD-10-CM

## 2018-10-05 DIAGNOSIS — Z170 Estrogen receptor positive status [ER+]: Secondary | ICD-10-CM

## 2018-10-05 DIAGNOSIS — Z5112 Encounter for antineoplastic immunotherapy: Secondary | ICD-10-CM

## 2018-10-05 DIAGNOSIS — R5383 Other fatigue: Secondary | ICD-10-CM

## 2018-10-05 DIAGNOSIS — Z7984 Long term (current) use of oral hypoglycemic drugs: Secondary | ICD-10-CM

## 2018-10-05 LAB — CBC WITH DIFFERENTIAL (CANCER CENTER ONLY)
Abs Immature Granulocytes: 0.08 10*3/uL — ABNORMAL HIGH (ref 0.00–0.07)
Basophils Absolute: 0.1 10*3/uL (ref 0.0–0.1)
Basophils Relative: 1 %
Eosinophils Absolute: 0.1 10*3/uL (ref 0.0–0.5)
Eosinophils Relative: 1 %
HCT: 37.7 % (ref 36.0–46.0)
Hemoglobin: 11.4 g/dL — ABNORMAL LOW (ref 12.0–15.0)
Immature Granulocytes: 1 %
Lymphocytes Relative: 24 %
Lymphs Abs: 1.5 10*3/uL (ref 0.7–4.0)
MCH: 28.6 pg (ref 26.0–34.0)
MCHC: 30.2 g/dL (ref 30.0–36.0)
MCV: 94.5 fL (ref 80.0–100.0)
Monocytes Absolute: 0.5 10*3/uL (ref 0.1–1.0)
Monocytes Relative: 8 %
Neutro Abs: 4.2 10*3/uL (ref 1.7–7.7)
Neutrophils Relative %: 65 %
Platelet Count: 253 10*3/uL (ref 150–400)
RBC: 3.99 MIL/uL (ref 3.87–5.11)
RDW: 15.6 % — ABNORMAL HIGH (ref 11.5–15.5)
WBC Count: 6.5 10*3/uL (ref 4.0–10.5)
nRBC: 0 % (ref 0.0–0.2)

## 2018-10-05 LAB — CMP (CANCER CENTER ONLY)
ALT: 10 U/L (ref 0–44)
AST: 15 U/L (ref 15–41)
Albumin: 3.7 g/dL (ref 3.5–5.0)
Alkaline Phosphatase: 77 U/L (ref 38–126)
Anion gap: 8 (ref 5–15)
BUN: 15 mg/dL (ref 8–23)
CO2: 26 mmol/L (ref 22–32)
Calcium: 8.9 mg/dL (ref 8.9–10.3)
Chloride: 105 mmol/L (ref 98–111)
Creatinine: 0.65 mg/dL (ref 0.44–1.00)
GFR, Est AFR Am: >60 mL/min (ref >60)
GFR, Estimated: >60 mL/min (ref >60)
Glucose, Bld: 149 mg/dL — ABNORMAL HIGH (ref 70–99)
Potassium: 4.2 mmol/L (ref 3.5–5.1)
Sodium: 139 mmol/L (ref 135–145)
Total Bilirubin: 0.2 mg/dL — ABNORMAL LOW (ref 0.3–1.2)
Total Protein: 7 g/dL (ref 6.5–8.1)

## 2018-10-05 MED ORDER — FAMOTIDINE IN NACL 20-0.9 MG/50ML-% IV SOLN
20.0000 mg | Freq: Once | INTRAVENOUS | Status: AC
  Administered 2018-10-05: 11:00:00 20 mg via INTRAVENOUS

## 2018-10-05 MED ORDER — DIPHENHYDRAMINE HCL 50 MG/ML IJ SOLN
25.0000 mg | Freq: Once | INTRAMUSCULAR | Status: AC
  Administered 2018-10-05: 11:00:00 25 mg via INTRAVENOUS

## 2018-10-05 MED ORDER — DEXAMETHASONE SODIUM PHOSPHATE 10 MG/ML IJ SOLN
10.0000 mg | Freq: Once | INTRAMUSCULAR | Status: AC
  Administered 2018-10-05: 10 mg via INTRAVENOUS

## 2018-10-05 MED ORDER — DEXAMETHASONE SODIUM PHOSPHATE 10 MG/ML IJ SOLN
INTRAMUSCULAR | Status: AC
  Filled 2018-10-05: qty 1

## 2018-10-05 MED ORDER — SODIUM CHLORIDE 0.9% FLUSH
10.0000 mL | Freq: Once | INTRAVENOUS | Status: AC
  Administered 2018-10-05: 10 mL
  Filled 2018-10-05: qty 10

## 2018-10-05 MED ORDER — DIPHENHYDRAMINE HCL 50 MG/ML IJ SOLN
INTRAMUSCULAR | Status: AC
  Filled 2018-10-05: qty 1

## 2018-10-05 MED ORDER — HEPARIN SOD (PORK) LOCK FLUSH 100 UNIT/ML IV SOLN
500.0000 [IU] | Freq: Once | INTRAVENOUS | Status: AC | PRN
  Administered 2018-10-05: 14:00:00 500 [IU]
  Filled 2018-10-05: qty 5

## 2018-10-05 MED ORDER — ACETAMINOPHEN 325 MG PO TABS
650.0000 mg | ORAL_TABLET | Freq: Once | ORAL | Status: AC
  Administered 2018-10-05: 650 mg via ORAL

## 2018-10-05 MED ORDER — SODIUM CHLORIDE 0.9% FLUSH
10.0000 mL | INTRAVENOUS | Status: DC | PRN
  Administered 2018-10-05: 10 mL
  Filled 2018-10-05: qty 10

## 2018-10-05 MED ORDER — ACETAMINOPHEN 325 MG PO TABS
ORAL_TABLET | ORAL | Status: AC
  Filled 2018-10-05: qty 2

## 2018-10-05 MED ORDER — SODIUM CHLORIDE 0.9 % IV SOLN
80.0000 mg/m2 | Freq: Once | INTRAVENOUS | Status: AC
  Administered 2018-10-05: 162 mg via INTRAVENOUS
  Filled 2018-10-05: qty 27

## 2018-10-05 MED ORDER — TRASTUZUMAB CHEMO 150 MG IV SOLR
2.0000 mg/kg | Freq: Once | INTRAVENOUS | Status: AC
  Administered 2018-10-05: 168 mg via INTRAVENOUS
  Filled 2018-10-05: qty 8

## 2018-10-05 MED ORDER — FAMOTIDINE IN NACL 20-0.9 MG/50ML-% IV SOLN
INTRAVENOUS | Status: AC
  Filled 2018-10-05: qty 50

## 2018-10-05 MED ORDER — SODIUM CHLORIDE 0.9 % IV SOLN
Freq: Once | INTRAVENOUS | Status: AC
  Administered 2018-10-05: 11:00:00 via INTRAVENOUS
  Filled 2018-10-05: qty 250

## 2018-10-05 NOTE — Patient Instructions (Signed)
[facility] Discharge Instructions for Patients Receiving Chemotherapy  Today you received the following chemotherapy agents Trastuzumab (HERCEPTIN) & Paclitaxel (TAXOL).  To help prevent nausea and vomiting after your treatment, we encourage you to take your nausea medication as prescribed.   If you develop nausea and vomiting that is not controlled by your nausea medication, call the clinic.   BELOW ARE SYMPTOMS THAT SHOULD BE REPORTED IMMEDIATELY:  *FEVER GREATER THAN 100.5 F  *CHILLS WITH OR WITHOUT FEVER  NAUSEA AND VOMITING THAT IS NOT CONTROLLED WITH YOUR NAUSEA MEDICATION  *UNUSUAL SHORTNESS OF BREATH  *UNUSUAL BRUISING OR BLEEDING  TENDERNESS IN MOUTH AND THROAT WITH OR WITHOUT PRESENCE OF ULCERS  *URINARY PROBLEMS  *BOWEL PROBLEMS  UNUSUAL RASH Items with * indicate a potential emergency and should be followed up as soon as possible.  Feel free to call the clinic should you have any questions or concerns. The clinic phone number is [phone].  Please show the [place] at check-in to the Emergency Department and triage nurse.  Coronavirus (COVID-19) Are you at risk?  Are you at risk for the Coronavirus (COVID-19)?  To be considered HIGH RISK for Coronavirus (COVID-19), you have to meet the following criteria:  . Traveled to [place], [place], [place], [place] or [place]; or in the [place] to [place], [place], [place], or [place]; and have fever, cough, and shortness of breath within the last 2 weeks of travel OR . Been in close contact with a person diagnosed with COVID-19 within the last 2 weeks and have fever, cough, and shortness of breath . IF YOU DO NOT MEET THESE CRITERIA, YOU ARE CONSIDERED LOW RISK FOR COVID-19.  What to do if you are HIGH RISK for COVID-19?  Marland Kitchen If you are having a medical emergency, call 911. . Seek medical care right away. Before you go to a doctor's office, urgent care or emergency department,  call ahead and tell them about your recent travel, contact with someone diagnosed with COVID-19, and your symptoms. You should receive instructions from your physician's office regarding next steps of care.  . When you arrive at healthcare provider, tell the healthcare staff immediately you have returned from visiting [place], [place], [place], [place] or [place]; or traveled in the [place] to [place], [place], [place], or [place]; in the last two weeks or you have been in close contact with a person diagnosed with COVID-19 in the last 2 weeks.   . Tell the health care staff about your symptoms: fever, cough and shortness of breath. . After you have been seen by a medical provider, you will be either: o Tested for (COVID-19) and discharged home on quarantine except to seek medical care if symptoms worsen, and asked to  - Stay home and avoid contact with others until you get your results (4-5 days)  - Avoid travel on public transportation if possible (such as bus, train, or airplane) or o Sent to the Emergency Department by EMS for evaluation, COVID-19 testing, and possible admission depending on your condition and test results.  What to do if you are LOW RISK for COVID-19?  Reduce your risk of any infection by using the same precautions used for avoiding the common cold or flu:  Marland Kitchen Wash your hands often with soap and warm water for at least 20 seconds.  If soap and water are not readily available, use an alcohol-based hand sanitizer with at least 60% alcohol.  Marland Kitchen  If coughing or sneezing, cover your mouth and nose by coughing or sneezing into the elbow areas of your shirt or coat, into a tissue or into your sleeve (not your hands). . Avoid shaking hands with others and consider head nods or verbal greetings only. . Avoid touching your eyes, nose, or mouth with unwashed hands.  . Avoid close contact with people who are sick. . Avoid places or events with large numbers of people in one  location, like concerts or sporting events. . Carefully consider travel plans you have or are making. . If you are planning any travel outside or inside the [place], visit the CDC's Travelers' Health webpage for the latest health notices. . If you have some symptoms but not all symptoms, continue to monitor at home and seek medical attention if your symptoms worsen. . If you are having a medical emergency, call 911.   [place] / e-Visit: [email or web address]         [facility]: [facility]: [phone]                   [facility]: [phone]

## 2018-10-05 NOTE — Assessment & Plan Note (Signed)
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 Treatment plan: 1.Adj chemo with Taxol-Herceptin weekly X 12 and then q 3 weeks Herceptin. 2.Adjuvant radiation therapy 3.Followed by adjuvant antiestrogen therapy ----------------------------------------------------------------------------------------------------------------------------------------------- Current treatment: Cycle8Taxol Herceptin Chemo toxicities: 1.Profound diarrhea: On Imodium.  Patient is scheduling Lomotil twice a day.  I instructed her to take Imodium when she has a loose stool on top of Lomotil.  She was not doing that and was having occasional 2-3 loose stools per day. 2.Fatigue as a result of diarrhea 3.Hair thinning 4.  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  Labs reviewed Closely monitoring for chemo toxicities Return to clinic weekly for Taxol Herceptin's and every other week for follow-up with me.

## 2018-10-10 ENCOUNTER — Telehealth: Payer: Self-pay

## 2018-10-10 NOTE — Telephone Encounter (Signed)

## 2018-10-10 NOTE — Telephone Encounter (Signed)
lmom for prescreen  

## 2018-10-12 ENCOUNTER — Other Ambulatory Visit: Payer: Self-pay

## 2018-10-12 ENCOUNTER — Inpatient Hospital Stay: Payer: Medicare Other

## 2018-10-12 ENCOUNTER — Inpatient Hospital Stay: Payer: Medicare Other | Attending: Hematology and Oncology

## 2018-10-12 ENCOUNTER — Encounter: Payer: Self-pay | Admitting: *Deleted

## 2018-10-12 VITALS — BP 136/74 | HR 63 | Temp 98.2°F | Resp 18 | Wt 174.5 lb

## 2018-10-12 DIAGNOSIS — C50411 Malignant neoplasm of upper-outer quadrant of right female breast: Secondary | ICD-10-CM

## 2018-10-12 DIAGNOSIS — Z5111 Encounter for antineoplastic chemotherapy: Secondary | ICD-10-CM | POA: Diagnosis present

## 2018-10-12 DIAGNOSIS — Z170 Estrogen receptor positive status [ER+]: Secondary | ICD-10-CM | POA: Insufficient documentation

## 2018-10-12 DIAGNOSIS — Z5112 Encounter for antineoplastic immunotherapy: Secondary | ICD-10-CM | POA: Diagnosis present

## 2018-10-12 DIAGNOSIS — Z95828 Presence of other vascular implants and grafts: Secondary | ICD-10-CM

## 2018-10-12 LAB — CBC WITH DIFFERENTIAL (CANCER CENTER ONLY)
Abs Immature Granulocytes: 0.03 10*3/uL (ref 0.00–0.07)
Basophils Absolute: 0.1 10*3/uL (ref 0.0–0.1)
Basophils Relative: 1 %
Eosinophils Absolute: 0 10*3/uL (ref 0.0–0.5)
Eosinophils Relative: 0 %
HCT: 38.7 % (ref 36.0–46.0)
Hemoglobin: 11.9 g/dL — ABNORMAL LOW (ref 12.0–15.0)
Immature Granulocytes: 1 %
Lymphocytes Relative: 24 %
Lymphs Abs: 1.1 10*3/uL (ref 0.7–4.0)
MCH: 28.8 pg (ref 26.0–34.0)
MCHC: 30.7 g/dL (ref 30.0–36.0)
MCV: 93.7 fL (ref 80.0–100.0)
Monocytes Absolute: 0.3 10*3/uL (ref 0.1–1.0)
Monocytes Relative: 7 %
Neutro Abs: 3.1 10*3/uL (ref 1.7–7.7)
Neutrophils Relative %: 67 %
Platelet Count: 207 10*3/uL (ref 150–400)
RBC: 4.13 MIL/uL (ref 3.87–5.11)
RDW: 15.9 % — ABNORMAL HIGH (ref 11.5–15.5)
WBC Count: 4.6 10*3/uL (ref 4.0–10.5)
nRBC: 0 % (ref 0.0–0.2)

## 2018-10-12 LAB — CMP (CANCER CENTER ONLY)
ALT: 11 U/L (ref 0–44)
AST: 13 U/L — ABNORMAL LOW (ref 15–41)
Albumin: 3.8 g/dL (ref 3.5–5.0)
Alkaline Phosphatase: 77 U/L (ref 38–126)
Anion gap: 9 (ref 5–15)
BUN: 14 mg/dL (ref 8–23)
CO2: 27 mmol/L (ref 22–32)
Calcium: 8.8 mg/dL — ABNORMAL LOW (ref 8.9–10.3)
Chloride: 104 mmol/L (ref 98–111)
Creatinine: 0.64 mg/dL (ref 0.44–1.00)
GFR, Est AFR Am: >60 mL/min (ref >60)
GFR, Estimated: >60 mL/min (ref >60)
Glucose, Bld: 157 mg/dL — ABNORMAL HIGH (ref 70–99)
Potassium: 4.2 mmol/L (ref 3.5–5.1)
Sodium: 140 mmol/L (ref 135–145)
Total Bilirubin: 0.3 mg/dL (ref 0.3–1.2)
Total Protein: 6.7 g/dL (ref 6.5–8.1)

## 2018-10-12 MED ORDER — ACETAMINOPHEN 325 MG PO TABS
ORAL_TABLET | ORAL | Status: AC
  Filled 2018-10-12: qty 2

## 2018-10-12 MED ORDER — FAMOTIDINE IN NACL 20-0.9 MG/50ML-% IV SOLN
INTRAVENOUS | Status: AC
  Filled 2018-10-12: qty 50

## 2018-10-12 MED ORDER — SODIUM CHLORIDE 0.9 % IV SOLN
80.0000 mg/m2 | Freq: Once | INTRAVENOUS | Status: AC
  Administered 2018-10-12: 14:00:00 162 mg via INTRAVENOUS
  Filled 2018-10-12: qty 27

## 2018-10-12 MED ORDER — SODIUM CHLORIDE 0.9% FLUSH
10.0000 mL | INTRAVENOUS | Status: DC | PRN
  Administered 2018-10-12: 15:00:00 10 mL
  Filled 2018-10-12: qty 10

## 2018-10-12 MED ORDER — TRASTUZUMAB CHEMO 150 MG IV SOLR
2.0000 mg/kg | Freq: Once | INTRAVENOUS | Status: AC
  Administered 2018-10-12: 168 mg via INTRAVENOUS
  Filled 2018-10-12: qty 8

## 2018-10-12 MED ORDER — ACETAMINOPHEN 325 MG PO TABS
650.0000 mg | ORAL_TABLET | Freq: Once | ORAL | Status: AC
  Administered 2018-10-12: 13:00:00 650 mg via ORAL

## 2018-10-12 MED ORDER — DEXAMETHASONE SODIUM PHOSPHATE 10 MG/ML IJ SOLN
10.0000 mg | Freq: Once | INTRAMUSCULAR | Status: AC
  Administered 2018-10-12: 13:00:00 10 mg via INTRAVENOUS

## 2018-10-12 MED ORDER — FAMOTIDINE IN NACL 20-0.9 MG/50ML-% IV SOLN
20.0000 mg | Freq: Once | INTRAVENOUS | Status: AC
  Administered 2018-10-12: 13:00:00 20 mg via INTRAVENOUS

## 2018-10-12 MED ORDER — DIPHENHYDRAMINE HCL 50 MG/ML IJ SOLN
INTRAMUSCULAR | Status: AC
  Filled 2018-10-12: qty 1

## 2018-10-12 MED ORDER — SODIUM CHLORIDE 0.9 % IV SOLN
Freq: Once | INTRAVENOUS | Status: AC
  Administered 2018-10-12: 13:00:00 via INTRAVENOUS
  Filled 2018-10-12: qty 250

## 2018-10-12 MED ORDER — HEPARIN SOD (PORK) LOCK FLUSH 100 UNIT/ML IV SOLN
500.0000 [IU] | Freq: Once | INTRAVENOUS | Status: AC | PRN
  Administered 2018-10-12: 500 [IU]
  Filled 2018-10-12: qty 5

## 2018-10-12 MED ORDER — SODIUM CHLORIDE 0.9% FLUSH
10.0000 mL | Freq: Once | INTRAVENOUS | Status: AC
  Administered 2018-10-12: 11:00:00 10 mL
  Filled 2018-10-12: qty 10

## 2018-10-12 MED ORDER — DIPHENHYDRAMINE HCL 50 MG/ML IJ SOLN
25.0000 mg | Freq: Once | INTRAMUSCULAR | Status: AC
  Administered 2018-10-12: 25 mg via INTRAVENOUS

## 2018-10-12 MED ORDER — DEXAMETHASONE SODIUM PHOSPHATE 10 MG/ML IJ SOLN
INTRAMUSCULAR | Status: AC
  Filled 2018-10-12: qty 1

## 2018-10-12 NOTE — Assessment & Plan Note (Signed)
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 Treatment plan: 1.Adj chemo with Taxol-Herceptin weekly X 12 and then q 3 weeks Herceptin. 2.Adjuvant radiation therapy 3.Followed by adjuvant antiestrogen therapy ----------------------------------------------------------------------------------------------------------------------------------------------- Current treatment: Cycle10Taxol Herceptin Chemo toxicities: 1.Profound diarrhea:On Imodium. Patient is scheduling Lomotil twice a day.   Doing much better 2.Fatigue as a result of diarrhea 3.Hair thinning 4.Chemo induced anemia: Monitoring closely. 5.  Leg swelling: For few days and resolved 6.  Bruises on the arm: Probably due to Coumadin.  Her INR was elevated recently.  She will be more careful.  Labs reviewed Closely monitoring for chemo toxicities Return to clinic weekly for Taxol Herceptin's and every other week for follow-up with me.

## 2018-10-12 NOTE — Patient Instructions (Signed)
[facility] Discharge Instructions for Patients Receiving Chemotherapy  Today you received the following chemotherapy agents Trastuzumab (HERCEPTIN) & Paclitaxel (TAXOL).  To help prevent nausea and vomiting after your treatment, we encourage you to take your nausea medication as prescribed.   If you develop nausea and vomiting that is not controlled by your nausea medication, call the clinic.   BELOW ARE SYMPTOMS THAT SHOULD BE REPORTED IMMEDIATELY:  *FEVER GREATER THAN 100.5 F  *CHILLS WITH OR WITHOUT FEVER  NAUSEA AND VOMITING THAT IS NOT CONTROLLED WITH YOUR NAUSEA MEDICATION  *UNUSUAL SHORTNESS OF BREATH  *UNUSUAL BRUISING OR BLEEDING  TENDERNESS IN MOUTH AND THROAT WITH OR WITHOUT PRESENCE OF ULCERS  *URINARY PROBLEMS  *BOWEL PROBLEMS  UNUSUAL RASH Items with * indicate a potential emergency and should be followed up as soon as possible.  Feel free to call the clinic should you have any questions or concerns. The clinic phone number is [phone].  Please show the [place] at check-in to the Emergency Department and triage nurse.  Coronavirus (COVID-19) Are you at risk?  Are you at risk for the Coronavirus (COVID-19)?  To be considered HIGH RISK for Coronavirus (COVID-19), you have to meet the following criteria:  . Traveled to [place], [place], [place], [place] or [place]; or in the [place] to [place], [place], [place], or [place]; and have fever, cough, and shortness of breath within the last 2 weeks of travel OR . Been in close contact with a person diagnosed with COVID-19 within the last 2 weeks and have fever, cough, and shortness of breath . IF YOU DO NOT MEET THESE CRITERIA, YOU ARE CONSIDERED LOW RISK FOR COVID-19.  What to do if you are HIGH RISK for COVID-19?  Marland Kitchen If you are having a medical emergency, call 911. . Seek medical care right away. Before you go to a doctor's office, urgent care or emergency department,  call ahead and tell them about your recent travel, contact with someone diagnosed with COVID-19, and your symptoms. You should receive instructions from your physician's office regarding next steps of care.  . When you arrive at healthcare provider, tell the healthcare staff immediately you have returned from visiting [place], [place], [place], [place] or [place]; or traveled in the [place] to [place], [place], [place], or [place]; in the last two weeks or you have been in close contact with a person diagnosed with COVID-19 in the last 2 weeks.   . Tell the health care staff about your symptoms: fever, cough and shortness of breath. . After you have been seen by a medical provider, you will be either: o Tested for (COVID-19) and discharged home on quarantine except to seek medical care if symptoms worsen, and asked to  - Stay home and avoid contact with others until you get your results (4-5 days)  - Avoid travel on public transportation if possible (such as bus, train, or airplane) or o Sent to the Emergency Department by EMS for evaluation, COVID-19 testing, and possible admission depending on your condition and test results.  What to do if you are LOW RISK for COVID-19?  Reduce your risk of any infection by using the same precautions used for avoiding the common cold or flu:  Marland Kitchen Wash your hands often with soap and warm water for at least 20 seconds.  If soap and water are not readily available, use an alcohol-based hand sanitizer with at least 60% alcohol.  Marland Kitchen  If coughing or sneezing, cover your mouth and nose by coughing or sneezing into the elbow areas of your shirt or coat, into a tissue or into your sleeve (not your hands). . Avoid shaking hands with others and consider head nods or verbal greetings only. . Avoid touching your eyes, nose, or mouth with unwashed hands.  . Avoid close contact with people who are sick. . Avoid places or events with large numbers of people in one  location, like concerts or sporting events. . Carefully consider travel plans you have or are making. . If you are planning any travel outside or inside the [place], visit the CDC's Travelers' Health webpage for the latest health notices. . If you have some symptoms but not all symptoms, continue to monitor at home and seek medical attention if your symptoms worsen. . If you are having a medical emergency, call 911.   [place] / e-Visit: [email or web address]         [facility]: [facility]: [phone]                   [facility]: [phone]

## 2018-10-12 NOTE — Patient Instructions (Signed)

## 2018-10-17 ENCOUNTER — Other Ambulatory Visit: Payer: Self-pay

## 2018-10-17 ENCOUNTER — Ambulatory Visit (INDEPENDENT_AMBULATORY_CARE_PROVIDER_SITE_OTHER): Payer: Medicare Other | Admitting: *Deleted

## 2018-10-17 ENCOUNTER — Other Ambulatory Visit: Payer: Self-pay | Admitting: Cardiology

## 2018-10-17 DIAGNOSIS — Z5181 Encounter for therapeutic drug level monitoring: Secondary | ICD-10-CM

## 2018-10-17 DIAGNOSIS — Z7901 Long term (current) use of anticoagulants: Secondary | ICD-10-CM | POA: Diagnosis not present

## 2018-10-17 DIAGNOSIS — I480 Paroxysmal atrial fibrillation: Secondary | ICD-10-CM | POA: Diagnosis not present

## 2018-10-17 LAB — POCT INR: INR: 2.5 (ref 2.0–3.0)

## 2018-10-17 NOTE — Patient Instructions (Signed)
Description   Continue 10mg  daily except 5mg  on Mondays, Wednesdays and Saturdays. Continue eating 2 servings of leafy green vegetables each week. Recheck INR in 3 weeks with cancer treatment

## 2018-10-18 NOTE — Progress Notes (Signed)
Patient Care Team: Enid Skeens., MD as PCP - General (Family Medicine) Mauro Kaufmann, RN as Oncology Nurse Navigator Rockwell Germany, RN as Oncology Nurse Navigator Erroll Luna, MD as Consulting Physician (General Surgery) Nicholas Lose, MD as Consulting Physician (Hematology and Oncology) Eppie Gibson, MD as Attending Physician (Radiation Oncology)  DIAGNOSIS:    ICD-10-CM   1. Malignant neoplasm of upper-outer quadrant of right breast in female, estrogen receptor positive ([place])  C50.411    Z17.0     SUMMARY OF ONCOLOGIC HISTORY: Oncology History  Malignant neoplasm of upper-outer quadrant of right breast in female, estrogen receptor positive ([place])  06/19/2018 Initial Diagnosis   Screening mammogram detected right breast mass at 9 o'clock position 3 cm from the nipple, ultrasound revealed 7 x 5 x 5 mm mass, axillary ultrasound normal-appearing lymph nodes, ultrasound biopsy: Grade 1 IDC ER 100%, PR 10%, Ki-67 10%, HER-2 3+ positive, T1b N0 stage Ia   06/28/2018 Cancer Staging   Staging form: Breast, AJCC 8th Edition - Clinical stage from 06/28/2018: Stage IA (cT1b, cN0, cM0, G1, ER+, PR+, HER2+) - Signed by Nicholas Lose, MD on 06/28/2018   07/05/2018 Genetic Testing   Negative genetic testing on the common hereditary cancer panel.  The Hereditary Gene Panel offered by [facility] includes sequencing and/or deletion duplication testing of the following 48 genes: APC, ATM, AXIN2, BARD1, BMPR1A, BRCA1, BRCA2, BRIP1, CDH1, CDK4, CDKN2A (p14ARF), CDKN2A (p16INK4a), CHEK2, CTNNA1, DICER1, EPCAM (Deletion/duplication testing only), GREM1 (promoter region deletion/duplication testing only), KIT, MEN1, MLH1, MSH2, MSH3, MSH6, MUTYH, NBN, NF1, NHTL1, PALB2, PDGFRA, PMS2, POLD1, POLE, PTEN, RAD50, RAD51C, RAD51D, RNF43, SDHB, SDHC, SDHD, SMAD4, SMARCA4. STK11, TP53, TSC1, TSC2, and VHL.  The following genes were evaluated for sequence changes only: SDHA and HOXB13 c.251G>A variant only. The  report date is July 05, 2018.   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    08/17/2018 -  Chemotherapy   The patient had trastuzumab (HERCEPTIN) 300 mg in sodium chloride 0.9 % 250 mL chemo infusion, 315 mg, Intravenous,  Once, 3 of 16 cycles Administration: 300 mg (08/17/2018), 150 mg (09/14/2018), 150 mg (08/31/2018), 150 mg (09/07/2018), 150 mg (09/21/2018), 168 mg (09/28/2018), 168 mg (10/05/2018), 168 mg (10/12/2018), 150 mg (08/24/2018) PACLitaxel (TAXOL) 162 mg in sodium chloride 0.9 % 250 mL chemo infusion (</= 54m/m2), 80 mg/m2 = 162 mg, Intravenous,  Once, 3 of 3 cycles Administration: 162 mg (08/17/2018), 162 mg (08/24/2018), 162 mg (09/14/2018), 162 mg (08/31/2018), 162 mg (09/07/2018), 162 mg (09/21/2018), 162 mg (09/28/2018), 162 mg (10/05/2018), 162 mg (10/12/2018)  for chemotherapy treatment.      CHIEF COMPLIANT: Cycle 10 Taxol Herceptin  INTERVAL HISTORY: DLucyle Alumbaughis a 68y.o. with above-mentioned history of right breast cancer who underwent a lumpectomy and is currently on adjuvant chemotherapy with weekly Taxol and Herceptin.She presents to the clinic todayforcycle10.  REVIEW OF SYSTEMS:   Constitutional: Denies fevers, chills or abnormal weight loss Eyes: Denies blurriness of vision Ears, nose, mouth, throat, and face: Denies mucositis or sore throat Respiratory: Denies cough, dyspnea or wheezes Cardiovascular: Denies palpitation, chest discomfort Gastrointestinal: Denies nausea, heartburn or change in bowel habits Skin: Denies abnormal skin rashes Lymphatics: Denies new lymphadenopathy or easy bruising Neurological: Denies numbness, tingling or new weaknesses Behavioral/Psych: Mood is stable, no new changes  Extremities: No lower extremity edema Breast: denies any pain or lumps or nodules  in either breasts All other systems were reviewed with the patient and are  negative.  I have reviewed the past medical history, past surgical history, social history and family history with the patient and they are unchanged from previous note.  ALLERGIES:  is allergic to atorvastatin and meperidine.  MEDICATIONS:  Current Outpatient Medications  Medication Sig Dispense Refill  . digoxin (LANOXIN) 0.125 MG tablet Take 1 tablet (125 mcg total) by mouth daily. 90 tablet 1  . diphenoxylate-atropine (LOMOTIL) 2.5-0.025 MG tablet Take 1 tablet by mouth 2 (two) times daily as needed for diarrhea or loose stools. 60 tablet 1  . escitalopram (LEXAPRO) 10 MG tablet Take 1 tablet by mouth daily.    . flecainide (TAMBOCOR) 100 MG tablet Take 1 tablet (100 mg total) by mouth 2 (two) times daily. 180 tablet 1  . ibuprofen (ADVIL,MOTRIN) 800 MG tablet Take 1 tablet (800 mg total) by mouth every 8 (eight) hours as needed. 30 tablet 0  . levothyroxine (SYNTHROID, LEVOTHROID) 100 MCG tablet Take 1 tablet by mouth daily.    Marland Kitchen lidocaine-prilocaine (EMLA) cream Apply to affected area once 30 g 3  . lisinopril (PRINIVIL,ZESTRIL) 10 MG tablet Take 1 tablet (10 mg total) by mouth daily. 90 tablet 3  . metFORMIN (GLUCOPHAGE) 500 MG tablet Take 1 tablet by mouth 2 (two) times daily with a meal.     . metoprolol succinate (TOPROL-XL) 50 MG 24 hr tablet TAKE 1 TABLET DAILY 90 tablet 1  . ondansetron (ZOFRAN) 8 MG tablet Take 1 tablet (8 mg total) by mouth 2 (two) times daily as needed (Nausea or vomiting). 30 tablet 1  . prochlorperazine (COMPAZINE) 10 MG tablet Take 1 tablet (10 mg total) by mouth every 6 (six) hours as needed (Nausea or vomiting). 30 tablet 1  . warfarin (COUMADIN) 10 MG tablet Take as directed by Coumadin Clinic 90 tablet 0   No current facility-administered medications for this visit.     PHYSICAL EXAMINATION: ECOG PERFORMANCE STATUS: 1 - Symptomatic but completely ambulatory  Vitals:   10/19/18 0944  BP: (!) 173/64  Pulse: 62  Resp: 17  Temp: 98.5 F (36.9 C)   SpO2: 96%   Filed Weights   10/19/18 0944  Weight: 176 lb 2 oz (79.9 kg)    Macular skin rash which is not itching. No evidence of peripheral neuropathy Rest of the physical exam not done due to COVID-19 precautions  LABORATORY DATA:  I have reviewed the data as listed CMP Latest Ref Rng & Units 10/12/2018 10/05/2018 09/28/2018  Glucose 70 - 99 mg/dL 157(H) 149(H) 125(H)  BUN 8 - 23 mg/dL 14 15 6(L)  Creatinine 0.44 - 1.00 mg/dL 0.64 0.65 0.58  Sodium 135 - 145 mmol/L 140 139 142  Potassium 3.5 - 5.1 mmol/L 4.2 4.2 3.6  Chloride 98 - 111 mmol/L 104 105 104  CO2 22 - 32 mmol/L 27 26 31   Calcium 8.9 - 10.3 mg/dL 8.8(L) 8.9 8.5(L)  Total Protein 6.5 - 8.1 g/dL 6.7 7.0 6.2(L)  Total Bilirubin 0.3 - 1.2 mg/dL 0.3 0.2(L) 0.3  Alkaline Phos 38 - 126 U/L 77 77 76  AST 15 - 41 U/L 13(L) 15 12(L)  ALT 0 - 44 U/L 11 10 10     Lab Results  Component Value Date   WBC 5.3 10/19/2018   HGB 12.1 10/19/2018   HCT 38.9 10/19/2018   MCV 92.4 10/19/2018   PLT 207 10/19/2018   NEUTROABS 3.1 10/19/2018    ASSESSMENT &  PLAN:  Malignant neoplasm of upper-outer quadrant of right breast in female, estrogen receptor positive ([place]) 07/13/2018:Right lumpectomy ([facility]): IDC with DCIS, 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 Treatment plan: 1.Adj chemo with Taxol-Herceptin weekly X 12 and then q 3 weeks Herceptin. 2.Adjuvant radiation therapy 3.Followed by adjuvant antiestrogen therapy ----------------------------------------------------------------------------------------------------------------------------------------------- Current treatment: Cycle10Taxol Herceptin Chemo toxicities: 1.Profound diarrhea:On Imodium. Patient is scheduling Lomotil twice a day.   Doing much better 2.Fatigue as a result of diarrhea 3.Hair thinning 4.Chemo induced anemia: Monitoring closely. 5.  Leg swelling: For few days and resolved 6.  Bruises on  the arm: Probably due to Coumadin.  Her INR was elevated recently.  She will be more careful.  Labs reviewed Closely monitoring for chemo toxicities Return to clinic weekly for Taxol Herceptin's and every other week for follow-up with me.     No orders of the defined types were placed in this encounter.  The patient has a good understanding of the overall plan. she agrees with it. she will call with any problems that may develop before the next visit here.  Nicholas Lose, MD 10/19/2018  Julious Oka Dorshimer am acting as scribe for Dr. Nicholas Lose.  I have reviewed the above documentation for accuracy and completeness, and I agree with the above.

## 2018-10-19 ENCOUNTER — Inpatient Hospital Stay (HOSPITAL_BASED_OUTPATIENT_CLINIC_OR_DEPARTMENT_OTHER): Payer: Medicare Other | Admitting: Hematology and Oncology

## 2018-10-19 ENCOUNTER — Inpatient Hospital Stay: Payer: Medicare Other

## 2018-10-19 ENCOUNTER — Other Ambulatory Visit: Payer: Self-pay | Admitting: *Deleted

## 2018-10-19 ENCOUNTER — Other Ambulatory Visit: Payer: Self-pay

## 2018-10-19 DIAGNOSIS — Z170 Estrogen receptor positive status [ER+]: Secondary | ICD-10-CM

## 2018-10-19 DIAGNOSIS — C50411 Malignant neoplasm of upper-outer quadrant of right female breast: Secondary | ICD-10-CM

## 2018-10-19 DIAGNOSIS — R197 Diarrhea, unspecified: Secondary | ICD-10-CM | POA: Diagnosis not present

## 2018-10-19 DIAGNOSIS — Z95828 Presence of other vascular implants and grafts: Secondary | ICD-10-CM

## 2018-10-19 DIAGNOSIS — R233 Spontaneous ecchymoses: Secondary | ICD-10-CM

## 2018-10-19 DIAGNOSIS — R5383 Other fatigue: Secondary | ICD-10-CM

## 2018-10-19 DIAGNOSIS — D6481 Anemia due to antineoplastic chemotherapy: Secondary | ICD-10-CM

## 2018-10-19 DIAGNOSIS — T451X5A Adverse effect of antineoplastic and immunosuppressive drugs, initial encounter: Secondary | ICD-10-CM

## 2018-10-19 DIAGNOSIS — Z5112 Encounter for antineoplastic immunotherapy: Secondary | ICD-10-CM | POA: Diagnosis not present

## 2018-10-19 LAB — CBC WITH DIFFERENTIAL (CANCER CENTER ONLY)
Abs Immature Granulocytes: 0.06 10*3/uL (ref 0.00–0.07)
Basophils Absolute: 0.1 10*3/uL (ref 0.0–0.1)
Basophils Relative: 1 %
Eosinophils Absolute: 0 10*3/uL (ref 0.0–0.5)
Eosinophils Relative: 1 %
HCT: 38.9 % (ref 36.0–46.0)
Hemoglobin: 12.1 g/dL (ref 12.0–15.0)
Immature Granulocytes: 1 %
Lymphocytes Relative: 31 %
Lymphs Abs: 1.6 10*3/uL (ref 0.7–4.0)
MCH: 28.7 pg (ref 26.0–34.0)
MCHC: 31.1 g/dL (ref 30.0–36.0)
MCV: 92.4 fL (ref 80.0–100.0)
Monocytes Absolute: 0.4 10*3/uL (ref 0.1–1.0)
Monocytes Relative: 8 %
Neutro Abs: 3.1 10*3/uL (ref 1.7–7.7)
Neutrophils Relative %: 58 %
Platelet Count: 207 10*3/uL (ref 150–400)
RBC: 4.21 MIL/uL (ref 3.87–5.11)
RDW: 15.9 % — ABNORMAL HIGH (ref 11.5–15.5)
WBC Count: 5.3 10*3/uL (ref 4.0–10.5)
nRBC: 0 % (ref 0.0–0.2)

## 2018-10-19 LAB — CMP (CANCER CENTER ONLY)
ALT: 13 U/L (ref 0–44)
AST: 16 U/L (ref 15–41)
Albumin: 3.9 g/dL (ref 3.5–5.0)
Alkaline Phosphatase: 74 U/L (ref 38–126)
Anion gap: 7 (ref 5–15)
BUN: 14 mg/dL (ref 8–23)
CO2: 28 mmol/L (ref 22–32)
Calcium: 9 mg/dL (ref 8.9–10.3)
Chloride: 106 mmol/L (ref 98–111)
Creatinine: 0.63 mg/dL (ref 0.44–1.00)
GFR, Est AFR Am: >60 mL/min (ref >60)
GFR, Estimated: >60 mL/min (ref >60)
Glucose, Bld: 117 mg/dL — ABNORMAL HIGH (ref 70–99)
Potassium: 4.5 mmol/L (ref 3.5–5.1)
Sodium: 141 mmol/L (ref 135–145)
Total Bilirubin: 0.3 mg/dL (ref 0.3–1.2)
Total Protein: 7 g/dL (ref 6.5–8.1)

## 2018-10-19 MED ORDER — DEXAMETHASONE SODIUM PHOSPHATE 10 MG/ML IJ SOLN
10.0000 mg | Freq: Once | INTRAMUSCULAR | Status: AC
  Administered 2018-10-19: 10 mg via INTRAVENOUS

## 2018-10-19 MED ORDER — DIPHENHYDRAMINE HCL 50 MG/ML IJ SOLN
25.0000 mg | Freq: Once | INTRAMUSCULAR | Status: AC
  Administered 2018-10-19: 25 mg via INTRAVENOUS

## 2018-10-19 MED ORDER — DEXAMETHASONE SODIUM PHOSPHATE 10 MG/ML IJ SOLN
INTRAMUSCULAR | Status: AC
  Filled 2018-10-19: qty 1

## 2018-10-19 MED ORDER — ACETAMINOPHEN 325 MG PO TABS
ORAL_TABLET | ORAL | Status: AC
  Filled 2018-10-19: qty 2

## 2018-10-19 MED ORDER — SODIUM CHLORIDE 0.9 % IV SOLN
80.0000 mg/m2 | Freq: Once | INTRAVENOUS | Status: AC
  Administered 2018-10-19: 13:00:00 162 mg via INTRAVENOUS
  Filled 2018-10-19: qty 27

## 2018-10-19 MED ORDER — FAMOTIDINE IN NACL 20-0.9 MG/50ML-% IV SOLN
INTRAVENOUS | Status: AC
  Filled 2018-10-19: qty 50

## 2018-10-19 MED ORDER — HEPARIN SOD (PORK) LOCK FLUSH 100 UNIT/ML IV SOLN
500.0000 [IU] | Freq: Once | INTRAVENOUS | Status: AC | PRN
  Administered 2018-10-19: 500 [IU]
  Filled 2018-10-19: qty 5

## 2018-10-19 MED ORDER — SODIUM CHLORIDE 0.9 % IV SOLN
Freq: Once | INTRAVENOUS | Status: AC
  Administered 2018-10-19: 10:00:00 via INTRAVENOUS
  Filled 2018-10-19: qty 250

## 2018-10-19 MED ORDER — FAMOTIDINE IN NACL 20-0.9 MG/50ML-% IV SOLN
20.0000 mg | Freq: Once | INTRAVENOUS | Status: AC
  Administered 2018-10-19: 11:00:00 20 mg via INTRAVENOUS

## 2018-10-19 MED ORDER — SODIUM CHLORIDE 0.9% FLUSH
10.0000 mL | INTRAVENOUS | Status: DC | PRN
  Administered 2018-10-19: 14:00:00 10 mL
  Filled 2018-10-19: qty 10

## 2018-10-19 MED ORDER — SODIUM CHLORIDE 0.9% FLUSH
10.0000 mL | Freq: Once | INTRAVENOUS | Status: AC
  Administered 2018-10-19: 10 mL
  Filled 2018-10-19: qty 10

## 2018-10-19 MED ORDER — DIPHENHYDRAMINE HCL 50 MG/ML IJ SOLN
INTRAMUSCULAR | Status: AC
  Filled 2018-10-19: qty 1

## 2018-10-19 MED ORDER — ACETAMINOPHEN 325 MG PO TABS
650.0000 mg | ORAL_TABLET | Freq: Once | ORAL | Status: AC
  Administered 2018-10-19: 650 mg via ORAL

## 2018-10-19 MED ORDER — TRASTUZUMAB CHEMO 150 MG IV SOLR
2.0000 mg/kg | Freq: Once | INTRAVENOUS | Status: AC
  Administered 2018-10-19: 168 mg via INTRAVENOUS
  Filled 2018-10-19: qty 8

## 2018-10-19 NOTE — Patient Instructions (Signed)
[facility] Discharge Instructions for Patients Receiving Chemotherapy  Today you received the following chemotherapy agents Trastuzumab (HERCEPTIN) & Paclitaxel (TAXOL).  To help prevent nausea and vomiting after your treatment, we encourage you to take your nausea medication as prescribed.   If you develop nausea and vomiting that is not controlled by your nausea medication, call the clinic.   BELOW ARE SYMPTOMS THAT SHOULD BE REPORTED IMMEDIATELY:  *FEVER GREATER THAN 100.5 F  *CHILLS WITH OR WITHOUT FEVER  NAUSEA AND VOMITING THAT IS NOT CONTROLLED WITH YOUR NAUSEA MEDICATION  *UNUSUAL SHORTNESS OF BREATH  *UNUSUAL BRUISING OR BLEEDING  TENDERNESS IN MOUTH AND THROAT WITH OR WITHOUT PRESENCE OF ULCERS  *URINARY PROBLEMS  *BOWEL PROBLEMS  UNUSUAL RASH Items with * indicate a potential emergency and should be followed up as soon as possible.  Feel free to call the clinic should you have any questions or concerns. The clinic phone number is [phone].  Please show the [facility] at check-in to the Emergency Department and triage nurse.  Coronavirus (COVID-19) Are you at risk?  Are you at risk for the Coronavirus (COVID-19)?  To be considered HIGH RISK for Coronavirus (COVID-19), you have to meet the following criteria:  . Traveled to [place], [place], [place], [place] or [place]; or in the [place] to [place], [place], [place], or [place]; and have fever, cough, and shortness of breath within the last 2 weeks of travel OR . Been in close contact with a person diagnosed with COVID-19 within the last 2 weeks and have fever, cough, and shortness of breath . IF YOU DO NOT MEET THESE CRITERIA, YOU ARE CONSIDERED LOW RISK FOR COVID-19.  What to do if you are HIGH RISK for COVID-19?  Marland Kitchen If you are having a medical emergency, call 911. . Seek medical care right away. Before you go to a doctor's office, urgent care or emergency department,  call ahead and tell them about your recent travel, contact with someone diagnosed with COVID-19, and your symptoms. You should receive instructions from your physician's office regarding next steps of care.  . When you arrive at healthcare provider, tell the healthcare staff immediately you have returned from visiting [place], [place], [place], [place] or [place]; or traveled in the [place] to [place], [place], [place], or [place]; in the last two weeks or you have been in close contact with a person diagnosed with COVID-19 in the last 2 weeks.   . Tell the health care staff about your symptoms: fever, cough and shortness of breath. . After you have been seen by a medical provider, you will be either: o Tested for (COVID-19) and discharged home on quarantine except to seek medical care if symptoms worsen, and asked to  - Stay home and avoid contact with others until you get your results (4-5 days)  - Avoid travel on public transportation if possible (such as bus, train, or airplane) or o Sent to the Emergency Department by EMS for evaluation, COVID-19 testing, and possible admission depending on your condition and test results.  What to do if you are LOW RISK for COVID-19?  Reduce your risk of any infection by using the same precautions used for avoiding the common cold or flu:  Marland Kitchen Wash your hands often with soap and warm water for at least 20 seconds.  If soap and water are not readily available, use an alcohol-based hand sanitizer with at least 60% alcohol.  Marland Kitchen  If coughing or sneezing, cover your mouth and nose by coughing or sneezing into the elbow areas of your shirt or coat, into a tissue or into your sleeve (not your hands). . Avoid shaking hands with others and consider head nods or verbal greetings only. . Avoid touching your eyes, nose, or mouth with unwashed hands.  . Avoid close contact with people who are sick. . Avoid places or events with large numbers of people in one  location, like concerts or sporting events. . Carefully consider travel plans you have or are making. . If you are planning any travel outside or inside the [place], visit the CDC's Travelers' Health webpage for the latest health notices. . If you have some symptoms but not all symptoms, continue to monitor at home and seek medical attention if your symptoms worsen. . If you are having a medical emergency, call 911.   [facility] / e-Visit: [email or web address]         [facility]: [facility]: [phone]                   [facility]: [phone]

## 2018-10-19 NOTE — Patient Instructions (Signed)

## 2018-10-24 ENCOUNTER — Encounter: Payer: Self-pay | Admitting: *Deleted

## 2018-10-26 ENCOUNTER — Ambulatory Visit: Payer: Medicare Other

## 2018-10-26 ENCOUNTER — Other Ambulatory Visit: Payer: Medicare Other

## 2018-10-27 ENCOUNTER — Inpatient Hospital Stay: Payer: Medicare Other

## 2018-10-27 ENCOUNTER — Other Ambulatory Visit: Payer: Self-pay

## 2018-10-27 VITALS — BP 152/84 | HR 69 | Temp 98.7°F | Resp 20 | Wt 177.2 lb

## 2018-10-27 DIAGNOSIS — Z5112 Encounter for antineoplastic immunotherapy: Secondary | ICD-10-CM | POA: Diagnosis not present

## 2018-10-27 DIAGNOSIS — Z170 Estrogen receptor positive status [ER+]: Secondary | ICD-10-CM

## 2018-10-27 DIAGNOSIS — Z95828 Presence of other vascular implants and grafts: Secondary | ICD-10-CM

## 2018-10-27 DIAGNOSIS — C50411 Malignant neoplasm of upper-outer quadrant of right female breast: Secondary | ICD-10-CM

## 2018-10-27 LAB — CMP (CANCER CENTER ONLY)
ALT: 10 U/L (ref 0–44)
AST: 13 U/L — ABNORMAL LOW (ref 15–41)
Albumin: 3.6 g/dL (ref 3.5–5.0)
Alkaline Phosphatase: 69 U/L (ref 38–126)
Anion gap: 10 (ref 5–15)
BUN: 10 mg/dL (ref 8–23)
CO2: 26 mmol/L (ref 22–32)
Calcium: 8.6 mg/dL — ABNORMAL LOW (ref 8.9–10.3)
Chloride: 105 mmol/L (ref 98–111)
Creatinine: 0.6 mg/dL (ref 0.44–1.00)
GFR, Est AFR Am: >60 mL/min (ref >60)
GFR, Estimated: >60 mL/min (ref >60)
Glucose, Bld: 145 mg/dL — ABNORMAL HIGH (ref 70–99)
Potassium: 4.2 mmol/L (ref 3.5–5.1)
Sodium: 141 mmol/L (ref 135–145)
Total Bilirubin: <0.2 mg/dL — ABNORMAL LOW (ref 0.3–1.2)
Total Protein: 6.5 g/dL (ref 6.5–8.1)

## 2018-10-27 LAB — CBC WITH DIFFERENTIAL (CANCER CENTER ONLY)
Abs Immature Granulocytes: 0.07 10*3/uL (ref 0.00–0.07)
Basophils Absolute: 0.1 10*3/uL (ref 0.0–0.1)
Basophils Relative: 1 %
Eosinophils Absolute: 0.1 10*3/uL (ref 0.0–0.5)
Eosinophils Relative: 1 %
HCT: 36.8 % (ref 36.0–46.0)
Hemoglobin: 11.4 g/dL — ABNORMAL LOW (ref 12.0–15.0)
Immature Granulocytes: 1 %
Lymphocytes Relative: 21 %
Lymphs Abs: 1.3 10*3/uL (ref 0.7–4.0)
MCH: 29 pg (ref 26.0–34.0)
MCHC: 31 g/dL (ref 30.0–36.0)
MCV: 93.6 fL (ref 80.0–100.0)
Monocytes Absolute: 0.5 10*3/uL (ref 0.1–1.0)
Monocytes Relative: 9 %
Neutro Abs: 4 10*3/uL (ref 1.7–7.7)
Neutrophils Relative %: 67 %
Platelet Count: 180 10*3/uL (ref 150–400)
RBC: 3.93 MIL/uL (ref 3.87–5.11)
RDW: 16.2 % — ABNORMAL HIGH (ref 11.5–15.5)
WBC Count: 6 10*3/uL (ref 4.0–10.5)
nRBC: 0 % (ref 0.0–0.2)

## 2018-10-27 MED ORDER — ACETAMINOPHEN 325 MG PO TABS
650.0000 mg | ORAL_TABLET | Freq: Once | ORAL | Status: AC
  Administered 2018-10-27: 650 mg via ORAL

## 2018-10-27 MED ORDER — DIPHENHYDRAMINE HCL 50 MG/ML IJ SOLN
25.0000 mg | Freq: Once | INTRAMUSCULAR | Status: AC
  Administered 2018-10-27: 25 mg via INTRAVENOUS

## 2018-10-27 MED ORDER — ACETAMINOPHEN 325 MG PO TABS
ORAL_TABLET | ORAL | Status: AC
  Filled 2018-10-27: qty 2

## 2018-10-27 MED ORDER — DEXAMETHASONE SODIUM PHOSPHATE 10 MG/ML IJ SOLN
10.0000 mg | Freq: Once | INTRAMUSCULAR | Status: AC
  Administered 2018-10-27: 10 mg via INTRAVENOUS

## 2018-10-27 MED ORDER — SODIUM CHLORIDE 0.9% FLUSH
10.0000 mL | INTRAVENOUS | Status: DC | PRN
  Administered 2018-10-27: 12:00:00 10 mL
  Filled 2018-10-27: qty 10

## 2018-10-27 MED ORDER — DIPHENHYDRAMINE HCL 50 MG/ML IJ SOLN
INTRAMUSCULAR | Status: AC
  Filled 2018-10-27: qty 1

## 2018-10-27 MED ORDER — SODIUM CHLORIDE 0.9 % IV SOLN
Freq: Once | INTRAVENOUS | Status: AC
  Administered 2018-10-27: 10:00:00 via INTRAVENOUS
  Filled 2018-10-27: qty 250

## 2018-10-27 MED ORDER — SODIUM CHLORIDE 0.9% FLUSH
10.0000 mL | Freq: Once | INTRAVENOUS | Status: AC
  Administered 2018-10-27: 10 mL
  Filled 2018-10-27: qty 10

## 2018-10-27 MED ORDER — HEPARIN SOD (PORK) LOCK FLUSH 100 UNIT/ML IV SOLN
500.0000 [IU] | Freq: Once | INTRAVENOUS | Status: AC | PRN
  Administered 2018-10-27: 500 [IU]
  Filled 2018-10-27: qty 5

## 2018-10-27 MED ORDER — FAMOTIDINE IN NACL 20-0.9 MG/50ML-% IV SOLN
20.0000 mg | Freq: Once | INTRAVENOUS | Status: AC
  Administered 2018-10-27: 20 mg via INTRAVENOUS

## 2018-10-27 MED ORDER — FAMOTIDINE IN NACL 20-0.9 MG/50ML-% IV SOLN
INTRAVENOUS | Status: AC
  Filled 2018-10-27: qty 50

## 2018-10-27 MED ORDER — DEXAMETHASONE SODIUM PHOSPHATE 10 MG/ML IJ SOLN
INTRAMUSCULAR | Status: AC
  Filled 2018-10-27: qty 1

## 2018-10-27 MED ORDER — TRASTUZUMAB CHEMO 150 MG IV SOLR
2.0000 mg/kg | Freq: Once | INTRAVENOUS | Status: AC
  Administered 2018-10-27: 168 mg via INTRAVENOUS
  Filled 2018-10-27: qty 8

## 2018-10-27 MED ORDER — SODIUM CHLORIDE 0.9 % IV SOLN
80.0000 mg/m2 | Freq: Once | INTRAVENOUS | Status: AC
  Administered 2018-10-27: 162 mg via INTRAVENOUS
  Filled 2018-10-27: qty 27

## 2018-10-27 NOTE — Patient Instructions (Signed)
[facility] Discharge Instructions for Patients Receiving Chemotherapy  Today you received the following chemotherapy agents:  Herceptin and Taxol.  To help prevent nausea and vomiting after your treatment, we encourage you to take your nausea medication as directed.   If you develop nausea and vomiting that is not controlled by your nausea medication, call the clinic.   BELOW ARE SYMPTOMS THAT SHOULD BE REPORTED IMMEDIATELY:  *FEVER GREATER THAN 100.5 F  *CHILLS WITH OR WITHOUT FEVER  NAUSEA AND VOMITING THAT IS NOT CONTROLLED WITH YOUR NAUSEA MEDICATION  *UNUSUAL SHORTNESS OF BREATH  *UNUSUAL BRUISING OR BLEEDING  TENDERNESS IN MOUTH AND THROAT WITH OR WITHOUT PRESENCE OF ULCERS  *URINARY PROBLEMS  *BOWEL PROBLEMS  UNUSUAL RASH Items with * indicate a potential emergency and should be followed up as soon as possible.  Feel free to call the clinic should you have any questions or concerns. The clinic phone number is [phone].  Please show the CHEMO ALERT CARD at check-in to the Emergency Department and triage nurse.   

## 2018-10-27 NOTE — Assessment & Plan Note (Signed)
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 Treatment plan: 1.Adj chemo with Taxol-Herceptin weekly X 12 and then q 3 weeks Herceptin. 2.Adjuvant radiation therapy 3.Followed by adjuvant antiestrogen therapy ----------------------------------------------------------------------------------------------------------------------------------------------- Current treatment: Cycle12Taxol Herceptin Chemo toxicities: 1.Profound diarrhea:On Imodium. Patient is scheduling Lomotil twice a day.Doing much better 2.Fatigue as a result of diarrhea 3.Hair thinning 4.Chemo induced anemia: Monitoring closely. 5.Leg swelling: For few days and resolved 6.Bruises on the arm: Probably due to Coumadin. Her INR was elevated recently. She will be more careful.  Labs reviewed Closely monitoring for chemo toxicities This concludes Taxol chemotherapy. We will set her up with adjuvant radiation. She will continue Herceptin maintenance every 3 weeks.

## 2018-11-01 NOTE — Progress Notes (Signed)
Patient Care Team: Enid Skeens., MD as PCP - General (Family Medicine) Mauro Kaufmann, RN as Oncology Nurse Navigator Rockwell Germany, RN as Oncology Nurse Navigator Erroll Luna, MD as Consulting Physician (General Surgery) Nicholas Lose, MD as Consulting Physician (Hematology and Oncology) Eppie Gibson, MD as Attending Physician (Radiation Oncology)  DIAGNOSIS:    ICD-10-CM   1. Malignant neoplasm of upper-outer quadrant of right breast in female, estrogen receptor positive ([place])  C50.411    Z17.0     SUMMARY OF ONCOLOGIC HISTORY: Oncology History  Malignant neoplasm of upper-outer quadrant of right breast in female, estrogen receptor positive ([place])  06/19/2018 Initial Diagnosis   Screening mammogram detected right breast mass at 9 o'clock position 3 cm from the nipple, ultrasound revealed 7 x 5 x 5 mm mass, axillary ultrasound normal-appearing lymph nodes, ultrasound biopsy: Grade 1 IDC ER 100%, PR 10%, Ki-67 10%, HER-2 3+ positive, T1b N0 stage Ia   06/28/2018 Cancer Staging   Staging form: Breast, AJCC 8th Edition - Clinical stage from 06/28/2018: Stage IA (cT1b, cN0, cM0, G1, ER+, PR+, HER2+) - Signed by Nicholas Lose, MD on 06/28/2018   07/05/2018 Genetic Testing   Negative genetic testing on the common hereditary cancer panel.  The Hereditary Gene Panel offered by [facility] includes sequencing and/or deletion duplication testing of the following 48 genes: APC, ATM, AXIN2, BARD1, BMPR1A, BRCA1, BRCA2, BRIP1, CDH1, CDK4, CDKN2A (p14ARF), CDKN2A (p16INK4a), CHEK2, CTNNA1, DICER1, EPCAM (Deletion/duplication testing only), GREM1 (promoter region deletion/duplication testing only), KIT, MEN1, MLH1, MSH2, MSH3, MSH6, MUTYH, NBN, NF1, NHTL1, PALB2, PDGFRA, PMS2, POLD1, POLE, PTEN, RAD50, RAD51C, RAD51D, RNF43, SDHB, SDHC, SDHD, SMAD4, SMARCA4. STK11, TP53, TSC1, TSC2, and VHL.  The following genes were evaluated for sequence changes only: SDHA and HOXB13 c.251G>A variant only. The  report date is July 05, 2018.   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    08/17/2018 -  Chemotherapy   The patient had trastuzumab (HERCEPTIN) 300 mg in sodium chloride 0.9 % 250 mL chemo infusion, 315 mg, Intravenous,  Once, 3 of 16 cycles Administration: 300 mg (08/17/2018), 150 mg (09/14/2018), 150 mg (08/31/2018), 150 mg (09/07/2018), 150 mg (09/21/2018), 168 mg (09/28/2018), 168 mg (10/05/2018), 168 mg (10/12/2018), 168 mg (10/19/2018), 168 mg (10/27/2018), 150 mg (08/24/2018) PACLitaxel (TAXOL) 162 mg in sodium chloride 0.9 % 250 mL chemo infusion (</= 89m/m2), 80 mg/m2 = 162 mg, Intravenous,  Once, 3 of 3 cycles Administration: 162 mg (08/17/2018), 162 mg (08/24/2018), 162 mg (09/14/2018), 162 mg (08/31/2018), 162 mg (09/07/2018), 162 mg (09/21/2018), 162 mg (09/28/2018), 162 mg (10/05/2018), 162 mg (10/12/2018), 162 mg (10/19/2018), 162 mg (10/27/2018)  for chemotherapy treatment.      CHIEF COMPLIANT: Cycle 12 Taxol Herceptin  INTERVAL HISTORY: Sabrina Qadiris a 68y.o. with above-mentioned history of right breast cancer who underwent a lumpectomy and is currently on adjuvant chemotherapy with weekly Taxol and Herceptin.She presents to the clinic todayforcycle12.  REVIEW OF SYSTEMS:   Constitutional: Denies fevers, chills or abnormal weight loss Eyes: Denies blurriness of vision Ears, nose, mouth, throat, and face: Denies mucositis or sore throat Respiratory: Denies cough, dyspnea or wheezes Cardiovascular: Denies palpitation, chest discomfort Gastrointestinal: Denies nausea, heartburn or change in bowel habits Skin: Denies abnormal skin rashes Lymphatics: Denies new lymphadenopathy or easy bruising Neurological: Denies numbness, tingling or new weaknesses Behavioral/Psych: Mood is stable, no new changes  Extremities:  No lower extremity edema Breast: denies any pain or lumps or nodules in  either breasts All other systems were reviewed with the patient and are negative.  I have reviewed the past medical history, past surgical history, social history and family history with the patient and they are unchanged from previous note.  ALLERGIES:  is allergic to atorvastatin and meperidine.  MEDICATIONS:  Current Outpatient Medications  Medication Sig Dispense Refill   digoxin (LANOXIN) 0.125 MG tablet Take 1 tablet (125 mcg total) by mouth daily. 90 tablet 1   diphenoxylate-atropine (LOMOTIL) 2.5-0.025 MG tablet Take 1 tablet by mouth 2 (two) times daily as needed for diarrhea or loose stools. 60 tablet 1   escitalopram (LEXAPRO) 10 MG tablet Take 1 tablet by mouth daily.     flecainide (TAMBOCOR) 100 MG tablet Take 1 tablet (100 mg total) by mouth 2 (two) times daily. 180 tablet 1   ibuprofen (ADVIL,MOTRIN) 800 MG tablet Take 1 tablet (800 mg total) by mouth every 8 (eight) hours as needed. 30 tablet 0   levothyroxine (SYNTHROID, LEVOTHROID) 100 MCG tablet Take 1 tablet by mouth daily.     lidocaine-prilocaine (EMLA) cream Apply to affected area once 30 g 3   lisinopril (PRINIVIL,ZESTRIL) 10 MG tablet Take 1 tablet (10 mg total) by mouth daily. 90 tablet 3   metFORMIN (GLUCOPHAGE) 500 MG tablet Take 1 tablet by mouth 2 (two) times daily with a meal.      metoprolol succinate (TOPROL-XL) 50 MG 24 hr tablet TAKE 1 TABLET DAILY 90 tablet 1   ondansetron (ZOFRAN) 8 MG tablet Take 1 tablet (8 mg total) by mouth 2 (two) times daily as needed (Nausea or vomiting). 30 tablet 1   prochlorperazine (COMPAZINE) 10 MG tablet Take 1 tablet (10 mg total) by mouth every 6 (six) hours as needed (Nausea or vomiting). 30 tablet 1   warfarin (COUMADIN) 10 MG tablet Take as directed by Coumadin Clinic 90 tablet 0   No current facility-administered medications for this visit.     PHYSICAL EXAMINATION: ECOG PERFORMANCE STATUS: 1 - Symptomatic but completely ambulatory  Vitals:    11/02/18 1010  BP: (!) 166/81  Pulse: 71  Resp: 17  Temp: 98 F (36.7 C)  SpO2: 98%   Filed Weights   11/02/18 1010  Weight: 173 lb 6.4 oz (78.7 kg)    GENERAL: alert, no distress and comfortable SKIN: skin color, texture, turgor are normal, no rashes or significant lesions EYES: normal, Conjunctiva are pink and non-injected, sclera clear OROPHARYNX: no exudate, no erythema and lips, buccal mucosa, and tongue normal  NECK: supple, thyroid normal size, non-tender, without nodularity LYMPH: no palpable lymphadenopathy in the cervical, axillary or inguinal LUNGS: clear to auscultation and percussion with normal breathing effort HEART: regular rate & rhythm and no murmurs and no lower extremity edema ABDOMEN: abdomen soft, non-tender and normal bowel sounds MUSCULOSKELETAL: no cyanosis of digits and no clubbing  NEURO: alert & oriented x 3 with fluent speech, no focal motor/sensory deficits EXTREMITIES: No lower extremity edema  LABORATORY DATA:  I have reviewed the data as listed CMP Latest Ref Rng & Units 10/27/2018 10/19/2018 10/12/2018  Glucose 70 - 99 mg/dL 145(H) 117(H) 157(H)  BUN 8 - 23 mg/dL 10 14 14   Creatinine 0.44 - 1.00 mg/dL 0.60 0.63 0.64  Sodium 135 - 145 mmol/L 141 141 140  Potassium 3.5 - 5.1 mmol/L 4.2 4.5 4.2  Chloride 98 - 111 mmol/L 105 106 104  CO2 22 - 32  mmol/L 26 28 27   Calcium 8.9 - 10.3 mg/dL 8.6(L) 9.0 8.8(L)  Total Protein 6.5 - 8.1 g/dL 6.5 7.0 6.7  Total Bilirubin 0.3 - 1.2 mg/dL <0.2(L) 0.3 0.3  Alkaline Phos 38 - 126 U/L 69 74 77  AST 15 - 41 U/L 13(L) 16 13(L)  ALT 0 - 44 U/L 10 13 11     Lab Results  Component Value Date   WBC 4.9 11/02/2018   HGB 11.5 (L) 11/02/2018   HCT 36.7 11/02/2018   MCV 93.1 11/02/2018   PLT 191 11/02/2018   NEUTROABS 3.1 11/02/2018    ASSESSMENT & PLAN:  Malignant neoplasm of upper-outer quadrant of right breast in female, estrogen receptor positive ([place]) 07/13/2018:Right lumpectomy ([facility]): IDC with DCIS,  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 Treatment plan: 1.Adj chemo with Taxol-Herceptin weekly X 12 and then q 3 weeks Herceptin. 2.Adjuvant radiation therapy 3.Followed by adjuvant antiestrogen therapy ----------------------------------------------------------------------------------------------------------------------------------------------- Current treatment: Cycle12Taxol Herceptin Chemo toxicities: 1.Profound diarrhea:On Imodium. Patient is scheduling Lomotil twice a day.Doing much better 2.Fatigue as a result of diarrhea 3.Hair thinning 4.Chemo induced anemia: Monitoring closely. 5.Leg swelling: For few days and resolved 6.Bruises on the arm: Probably due to Coumadin. Her INR was elevated recently. She will be more careful.  Labs reviewed Closely monitoring for chemo toxicities This concludes Taxol chemotherapy. We will set her up with adjuvant radiation. She will continue Herceptin maintenance every 3 weeks.    No orders of the defined types were placed in this encounter.  The patient has a good understanding of the overall plan. she agrees with it. she will call with any problems that may develop before the next visit here.  Nicholas Lose, MD 11/02/2018  Julious Oka Dorshimer am acting as scribe for Dr. Nicholas Lose.  I have reviewed the above documentation for accuracy and completeness, and I agree with the above.

## 2018-11-02 ENCOUNTER — Other Ambulatory Visit: Payer: Self-pay

## 2018-11-02 ENCOUNTER — Inpatient Hospital Stay: Payer: Medicare Other

## 2018-11-02 ENCOUNTER — Inpatient Hospital Stay (HOSPITAL_BASED_OUTPATIENT_CLINIC_OR_DEPARTMENT_OTHER): Payer: Medicare Other | Admitting: Hematology and Oncology

## 2018-11-02 VITALS — BP 148/78

## 2018-11-02 DIAGNOSIS — Z95828 Presence of other vascular implants and grafts: Secondary | ICD-10-CM

## 2018-11-02 DIAGNOSIS — D6481 Anemia due to antineoplastic chemotherapy: Secondary | ICD-10-CM

## 2018-11-02 DIAGNOSIS — C50411 Malignant neoplasm of upper-outer quadrant of right female breast: Secondary | ICD-10-CM

## 2018-11-02 DIAGNOSIS — Z170 Estrogen receptor positive status [ER+]: Secondary | ICD-10-CM

## 2018-11-02 DIAGNOSIS — Z5112 Encounter for antineoplastic immunotherapy: Secondary | ICD-10-CM | POA: Diagnosis not present

## 2018-11-02 DIAGNOSIS — R197 Diarrhea, unspecified: Secondary | ICD-10-CM

## 2018-11-02 DIAGNOSIS — R5383 Other fatigue: Secondary | ICD-10-CM

## 2018-11-02 LAB — CBC WITH DIFFERENTIAL (CANCER CENTER ONLY)
Abs Immature Granulocytes: 0.03 10*3/uL (ref 0.00–0.07)
Basophils Absolute: 0 10*3/uL (ref 0.0–0.1)
Basophils Relative: 1 %
Eosinophils Absolute: 0 10*3/uL (ref 0.0–0.5)
Eosinophils Relative: 0 %
HCT: 36.7 % (ref 36.0–46.0)
Hemoglobin: 11.5 g/dL — ABNORMAL LOW (ref 12.0–15.0)
Immature Granulocytes: 1 %
Lymphocytes Relative: 29 %
Lymphs Abs: 1.4 10*3/uL (ref 0.7–4.0)
MCH: 29.2 pg (ref 26.0–34.0)
MCHC: 31.3 g/dL (ref 30.0–36.0)
MCV: 93.1 fL (ref 80.0–100.0)
Monocytes Absolute: 0.3 10*3/uL (ref 0.1–1.0)
Monocytes Relative: 6 %
Neutro Abs: 3.1 10*3/uL (ref 1.7–7.7)
Neutrophils Relative %: 63 %
Platelet Count: 191 10*3/uL (ref 150–400)
RBC: 3.94 MIL/uL (ref 3.87–5.11)
RDW: 16.2 % — ABNORMAL HIGH (ref 11.5–15.5)
WBC Count: 4.9 10*3/uL (ref 4.0–10.5)
nRBC: 0 % (ref 0.0–0.2)

## 2018-11-02 LAB — CMP (CANCER CENTER ONLY)
ALT: 11 U/L (ref 0–44)
AST: 12 U/L — ABNORMAL LOW (ref 15–41)
Albumin: 3.6 g/dL (ref 3.5–5.0)
Alkaline Phosphatase: 68 U/L (ref 38–126)
Anion gap: 7 (ref 5–15)
BUN: 14 mg/dL (ref 8–23)
CO2: 28 mmol/L (ref 22–32)
Calcium: 9.3 mg/dL (ref 8.9–10.3)
Chloride: 105 mmol/L (ref 98–111)
Creatinine: 0.6 mg/dL (ref 0.44–1.00)
GFR, Est AFR Am: >60 mL/min (ref >60)
GFR, Estimated: >60 mL/min (ref >60)
Glucose, Bld: 139 mg/dL — ABNORMAL HIGH (ref 70–99)
Potassium: 4.1 mmol/L (ref 3.5–5.1)
Sodium: 140 mmol/L (ref 135–145)
Total Bilirubin: 0.3 mg/dL (ref 0.3–1.2)
Total Protein: 6.5 g/dL (ref 6.5–8.1)

## 2018-11-02 MED ORDER — SODIUM CHLORIDE 0.9% FLUSH
10.0000 mL | Freq: Once | INTRAVENOUS | Status: AC
  Administered 2018-11-02: 10 mL
  Filled 2018-11-02: qty 10

## 2018-11-02 MED ORDER — DEXAMETHASONE SODIUM PHOSPHATE 10 MG/ML IJ SOLN
10.0000 mg | Freq: Once | INTRAMUSCULAR | Status: AC
  Administered 2018-11-02: 11:00:00 10 mg via INTRAVENOUS

## 2018-11-02 MED ORDER — DIPHENHYDRAMINE HCL 50 MG/ML IJ SOLN
25.0000 mg | Freq: Once | INTRAMUSCULAR | Status: AC
  Administered 2018-11-02: 11:00:00 25 mg via INTRAVENOUS

## 2018-11-02 MED ORDER — SODIUM CHLORIDE 0.9 % IV SOLN
Freq: Once | INTRAVENOUS | Status: AC
  Administered 2018-11-02: 11:00:00 via INTRAVENOUS
  Filled 2018-11-02: qty 250

## 2018-11-02 MED ORDER — FUROSEMIDE 10 MG/ML IJ SOLN: 40.0000 mg | Freq: Once | INTRAMUSCULAR | Status: DC

## 2018-11-02 MED ORDER — SODIUM CHLORIDE 0.9 % IV SOLN
80.0000 mg/m2 | Freq: Once | INTRAVENOUS | Status: AC
  Administered 2018-11-02: 162 mg via INTRAVENOUS
  Filled 2018-11-02: qty 27

## 2018-11-02 MED ORDER — DEXAMETHASONE SODIUM PHOSPHATE 10 MG/ML IJ SOLN
INTRAMUSCULAR | Status: AC
  Filled 2018-11-02: qty 1

## 2018-11-02 MED ORDER — HEPARIN SOD (PORK) LOCK FLUSH 100 UNIT/ML IV SOLN
500.0000 [IU] | Freq: Once | INTRAVENOUS | Status: AC | PRN
  Administered 2018-11-02: 500 [IU]
  Filled 2018-11-02: qty 5

## 2018-11-02 MED ORDER — SODIUM CHLORIDE 0.9% FLUSH
10.0000 mL | INTRAVENOUS | Status: DC | PRN
  Administered 2018-11-02: 13:00:00 10 mL
  Filled 2018-11-02: qty 10

## 2018-11-02 MED ORDER — DIPHENHYDRAMINE HCL 50 MG/ML IJ SOLN
INTRAMUSCULAR | Status: AC
  Filled 2018-11-02: qty 1

## 2018-11-02 MED ORDER — HEPARIN SOD (PORK) LOCK FLUSH 100 UNIT/ML IV SOLN
500.0000 [IU] | Freq: Once | INTRAVENOUS | Status: DC
  Filled 2018-11-02: qty 5

## 2018-11-02 MED ORDER — ACETAMINOPHEN 325 MG PO TABS
ORAL_TABLET | ORAL | Status: AC
  Filled 2018-11-02: qty 2

## 2018-11-02 MED ORDER — FAMOTIDINE IN NACL 20-0.9 MG/50ML-% IV SOLN
20.0000 mg | Freq: Once | INTRAVENOUS | Status: AC
  Administered 2018-11-02: 11:00:00 20 mg via INTRAVENOUS

## 2018-11-02 MED ORDER — TRASTUZUMAB CHEMO 150 MG IV SOLR: 2.0000 mg/kg | Freq: Once | INTRAVENOUS | Status: DC

## 2018-11-02 MED ORDER — FAMOTIDINE IN NACL 20-0.9 MG/50ML-% IV SOLN
INTRAVENOUS | Status: AC
  Filled 2018-11-02: qty 50

## 2018-11-02 MED ORDER — ACETAMINOPHEN 325 MG PO TABS
650.0000 mg | ORAL_TABLET | Freq: Once | ORAL | Status: AC
  Administered 2018-11-02: 650 mg via ORAL

## 2018-11-02 MED ORDER — TRASTUZUMAB CHEMO 150 MG IV SOLR
450.0000 mg | Freq: Once | INTRAVENOUS | Status: AC
  Administered 2018-11-02: 12:00:00 450 mg via INTRAVENOUS
  Filled 2018-11-02: qty 21.43

## 2018-11-02 NOTE — Patient Instructions (Signed)
[facility] Discharge Instructions for Patients Receiving Chemotherapy  Today you received the following chemotherapy agents :  Herceptin,  Taxol.  To help prevent nausea and vomiting after your treatment, we encourage you to take your nausea medication as prescribed.   If you develop nausea and vomiting that is not controlled by your nausea medication, call the clinic.   BELOW ARE SYMPTOMS THAT SHOULD BE REPORTED IMMEDIATELY:  *FEVER GREATER THAN 100.5 F  *CHILLS WITH OR WITHOUT FEVER  NAUSEA AND VOMITING THAT IS NOT CONTROLLED WITH YOUR NAUSEA MEDICATION  *UNUSUAL SHORTNESS OF BREATH  *UNUSUAL BRUISING OR BLEEDING  TENDERNESS IN MOUTH AND THROAT WITH OR WITHOUT PRESENCE OF ULCERS  *URINARY PROBLEMS  *BOWEL PROBLEMS  UNUSUAL RASH Items with * indicate a potential emergency and should be followed up as soon as possible.  Feel free to call the clinic should you have any questions or concerns. The clinic phone number is [phone].  Please show the CHEMO ALERT CARD at check-in to the Emergency Department and triage nurse.   

## 2018-11-03 ENCOUNTER — Telehealth: Payer: Self-pay | Admitting: *Deleted

## 2018-11-03 NOTE — Progress Notes (Signed)
Location of Breast Cancer: Right Breast  Histology per Pathology Report:  06/19/18 Diagnosis Breast, right, needle core biopsy, upper outer, 9:30 o'clock - INVASIVE DUCTAL CARCINOMA, SEE COMMENT.  Receptor Status: ER(100%), PR (10%), Her2-neu (POS), Ki-(10%)  07/13/18 Diagnosis 1. Breast, lumpectomy, Right w/seed - INVASIVE DUCTAL CARCINOMA, GRADE II/III, SPANNING 1.0 CM. - LYMPHOVASCULAR INVASION IS IDENTIFIED. - DUCTAL CARCINOMA IN SITU, HIGH GRADE. - THE SURGICAL RESECTION MARGINS ARE NEGATIVE FOR CARCINOMA. - SEE ONCOLOGY TABLE BELOW. 2. Lymph node, sentinel, biopsy, right - THERE IS NO EVIDENCE OF CARCINOMA IN 1 OF 1 LYMPH NODE (0/1). 3. Lymph node, sentinel, biopsy, Right - THERE IS NO EVIDENCE OF CARCINOMA IN 1 OF 1 LYMPH NODE (0/1). 4. Breast, excision, Right additional anterior margin - BENIGN BREAST PARENCHYMA. - THERE IS NO EVIDENCE OF MALIGNANCY.  Did patient present with symptoms or was this found on screening mammography?: It was discovered on a screening mammogram.   Past/Anticipated interventions by surgeon, if any: 07/13/18 Procedure: Right breast seed localized lumpectomy with right axillary sentinel lymph node mapping of deep right axillary sentinel node Surgeon: Erroll Luna, MD  Past/Anticipated interventions by medical oncology, if any:  11/02/18 Dr. Lindi Adie  Current treatment: Cycle12Taxol Herceptin Chemo toxicities: 1.Profound diarrhea:On Imodium. Patient is scheduling Lomotil twice a day.Doing much better 2.Fatigue as a result of diarrhea 3.Hair thinning 4.Chemo induced anemia: Monitoring closely. 5.Leg swelling: For few days and resolved 6.Bruises on the arm: Probably due to Coumadin. Her INR was elevated recently. She will be more careful.  Labs reviewed Closely monitoring for chemo toxicities This concludes Taxol chemotherapy. We will set her up with adjuvant radiation. She will continue Herceptin maintenance every 3  weeks.  Lymphedema issues, if any:  She denies. She reports good mobility.   Pain issues, if any:  She denies.   SAFETY ISSUES:  Prior radiation? No  Pacemaker/ICD? No  Possible current pregnancy? No  Is the patient on methotrexate? No  Current Complaints / other details:      Sabrina [name], Stephani Police, RN 11/03/2018,10:24 AM

## 2018-11-03 NOTE — Telephone Encounter (Signed)
Called pt to congratulate on completion of taxol. Confirmed future appts. Denies questions or needs at this time.

## 2018-11-07 ENCOUNTER — Ambulatory Visit (INDEPENDENT_AMBULATORY_CARE_PROVIDER_SITE_OTHER): Payer: Medicare Other | Admitting: *Deleted

## 2018-11-07 ENCOUNTER — Other Ambulatory Visit: Payer: Self-pay

## 2018-11-07 DIAGNOSIS — Z5181 Encounter for therapeutic drug level monitoring: Secondary | ICD-10-CM

## 2018-11-07 DIAGNOSIS — Z7901 Long term (current) use of anticoagulants: Secondary | ICD-10-CM | POA: Diagnosis not present

## 2018-11-07 DIAGNOSIS — I480 Paroxysmal atrial fibrillation: Secondary | ICD-10-CM | POA: Diagnosis not present

## 2018-11-07 LAB — POCT INR: INR: 2.2 (ref 2.0–3.0)

## 2018-11-07 NOTE — Patient Instructions (Signed)
Description   Continue 10mg  daily except 5mg  on Mondays, Wednesdays and Saturdays. Continue eating 2 servings of leafy green vegetables each week. Recheck INR in 6 weeks.

## 2018-11-08 NOTE — Progress Notes (Signed)
The following biosimilar Ogivri (trastuzumab-dkst) has been selected for use in this patient.  Kennith Center, Pharm.D., CPP 11/08/2018@8 :22 PM

## 2018-11-09 NOTE — Progress Notes (Signed)
Radiation Oncology         [phone]) [phone] ________________________________  Name: Sabrina [name] MRN: [id number]  Date: 11/10/2018  DOB: 25-Jun-1950  Follow-Up Visit Note -by WebEx due to pandemic precautions  Outpatient  CC: [place], Marshall Cork., MD  Nicholas Lose, MD  Diagnosis:      ICD-10-CM   1. Malignant neoplasm of upper-outer quadrant of right breast in female, estrogen receptor positive ([place])  C50.411    Z17.0      Cancer Staging Malignant neoplasm of upper-outer quadrant of right breast in female, estrogen receptor positive ([place]) Staging form: Breast, AJCC 8th Edition - Clinical stage from 06/28/2018: Stage IA (cT1b, cN0, cM0, G1, ER+, PR+, HER2+) - Signed by Nicholas Lose, MD on 06/28/2018  CHIEF COMPLAINT: Here to discuss management of right breast cancer  Narrative:  The patient returns today for follow-up to discuss radiation treatment options. She was seen in the multidisciplinary breast clinic on 06/28/2018.  Since her last visit, her genetic testing results returned and were negative.  She opted to proceed with right breast lumpectomy on date of 07/13/2018 with pathology report revealing: tumor size of 1.0 cm; histology of ductal carcinoma; margin status to invasive disease of greater than 0.2 cm and margin status to in situ disease of greater than 0.2 cm; nodal status of negative (0/2); ER status: 100%; PR status 10%, Her2 status positive; Grade 2.   She was treated with systemic therapy by Dr. Lindi Adie. she finished Taxol on 11/02/2018. She will now move to herceptin maintenance every 3 weeks.  Symptomatically, the patient reports: She is healing from chemotherapy.  She denies lymphedema.  She has good mobility in her arm.  She denies pain.  She continues to smoke.  She is currently in the [place] staying in her camper as a mini vacation after completing chemotherapy       ALLERGIES:  is allergic to atorvastatin and meperidine.  Meds: Current Outpatient Medications   Medication Sig Dispense Refill  . digoxin (LANOXIN) 0.125 MG tablet Take 1 tablet (125 mcg total) by mouth daily. 90 tablet 1  . diphenoxylate-atropine (LOMOTIL) 2.5-0.025 MG tablet Take 1 tablet by mouth 2 (two) times daily as needed for diarrhea or loose stools. 60 tablet 1  . escitalopram (LEXAPRO) 10 MG tablet Take 1 tablet by mouth daily.    . flecainide (TAMBOCOR) 100 MG tablet Take 1 tablet (100 mg total) by mouth 2 (two) times daily. 180 tablet 1  . levothyroxine (SYNTHROID, LEVOTHROID) 100 MCG tablet Take 1 tablet by mouth daily.    Marland Kitchen lidocaine-prilocaine (EMLA) cream Apply to affected area once 30 g 3  . lisinopril (PRINIVIL,ZESTRIL) 10 MG tablet Take 1 tablet (10 mg total) by mouth daily. 90 tablet 3  . metFORMIN (GLUCOPHAGE) 500 MG tablet Take 1 tablet by mouth 2 (two) times daily with a meal.     . metoprolol succinate (TOPROL-XL) 50 MG 24 hr tablet TAKE 1 TABLET DAILY 90 tablet 1  . warfarin (COUMADIN) 10 MG tablet Take as directed by [facility] 90 tablet 0  . ibuprofen (ADVIL,MOTRIN) 800 MG tablet Take 1 tablet (800 mg total) by mouth every 8 (eight) hours as needed. (Patient not taking: Reported on 11/10/2018) 30 tablet 0  . ondansetron (ZOFRAN) 8 MG tablet Take 1 tablet (8 mg total) by mouth 2 (two) times daily as needed (Nausea or vomiting). (Patient not taking: Reported on 11/10/2018) 30 tablet 1  . prochlorperazine (COMPAZINE) 10 MG tablet Take 1 tablet (10 mg total)  by mouth every 6 (six) hours as needed (Nausea or vomiting). (Patient not taking: Reported on 11/10/2018) 30 tablet 1   No current facility-administered medications for this encounter.     Physical Findings:  vitals were not taken for this visit. .     General: Alert and oriented, in no acute distress Neurologic:  Speech is fluent.  Psychiatric: Judgment and insight are intact. Affect is appropriate.   Lab Findings: Lab Results  Component Value Date   WBC 4.9 11/02/2018   HGB 11.5 (L) 11/02/2018    HCT 36.7 11/02/2018   MCV 93.1 11/02/2018   PLT 191 11/02/2018    Radiographic Findings: No results found.  Impression/Plan: Right Breast IDC, triple positive  We discussed adjuvant radiotherapy today.  I recommend 4 weeks directed at the right breast in order to reduce risk of local recurrence by 2/3.  The risks, benefits and side effects of this treatment were discussed in detail.  She understands that radiotherapy is associated with skin irritation and fatigue in the acute setting. Late effects can include cosmetic changes and rare injury to internal organs.  She is enthusiastic about proceeding with treatment.     A total of 3 medically necessary complex treatment devices will be fabricated and supervised by me: 2 fields with MLCs for custom blocks to protect heart, and lungs;  and, a Vac-lok. MORE COMPLEX DEVICES MAY BE MADE IN DOSIMETRY FOR FIELD IN FIELD BEAMS FOR DOSE HOMOGENEITY.  I have requested : 3D Simulation which is medically necessary to give adequate dose to at risk tissues while sparing lungs and heart.  I have requested a DVH of the following structures: lungs, heart, right lumpectomy cavity.    The patient will receive 40.05 Gy in 15 fractions to the right breast with 2 fields.  This will be followed by a boost.   For personal reasons, she'd like to hold RT until Aug 24 and I think that is fine. We will simulate about a week before that. She prefers mornings.  I asked the patient today about tobacco use. The patient uses tobacco.  I advised the patient to quit. She does not feel ready to do this.  This encounter was provided by telemedicine platform Webex.  The patient has given verbal consent for this type of encounter and has been advised to only accept a meeting of this type in a secure network environment. The time spent during this encounter was over 20 minutes. The attendants for this meeting include Eppie Gibson  and [name].  During the encounter, Eppie Gibson was located at [facility] Radiation Oncology Department.  Marnee Sherrard was located at home.  _____________________________________   Eppie Gibson, MD   This document serves as a record of services personally performed by Eppie Gibson, MD. It was created on her behalf by Wilburn Mylar, a trained medical scribe. The creation of this record is based on the scribe's personal observations and the provider's statements to them. This document has been checked and approved by the attending provider.

## 2018-11-10 ENCOUNTER — Other Ambulatory Visit: Payer: Self-pay

## 2018-11-10 ENCOUNTER — Ambulatory Visit
Admission: RE | Admit: 2018-11-10 | Discharge: 2018-11-10 | Disposition: A | Discharge status: Home / Self Care | Payer: Medicare Other | Source: Ambulatory Visit | Attending: Radiation Oncology | Admitting: Radiation Oncology

## 2018-11-10 ENCOUNTER — Encounter: Payer: Self-pay | Admitting: Radiation Oncology

## 2018-11-10 DIAGNOSIS — Z170 Estrogen receptor positive status [ER+]: Secondary | ICD-10-CM

## 2018-11-10 DIAGNOSIS — C50411 Malignant neoplasm of upper-outer quadrant of right female breast: Secondary | ICD-10-CM

## 2018-11-13 ENCOUNTER — Other Ambulatory Visit: Payer: Self-pay

## 2018-11-13 ENCOUNTER — Ambulatory Visit (INDEPENDENT_AMBULATORY_CARE_PROVIDER_SITE_OTHER): Payer: Medicare Other

## 2018-11-13 DIAGNOSIS — I480 Paroxysmal atrial fibrillation: Secondary | ICD-10-CM

## 2018-11-13 NOTE — Progress Notes (Signed)
Complete echocardiogram has been performed.  Jimmy Jakira Mcfadden RDCS, RVT 

## 2018-11-23 ENCOUNTER — Other Ambulatory Visit: Payer: Self-pay

## 2018-11-23 ENCOUNTER — Inpatient Hospital Stay: Payer: Medicare Other | Attending: Hematology and Oncology

## 2018-11-23 VITALS — BP 166/90 | HR 79 | Temp 98.5°F | Resp 19 | Ht 69.0 in | Wt 174.4 lb

## 2018-11-23 DIAGNOSIS — C50411 Malignant neoplasm of upper-outer quadrant of right female breast: Secondary | ICD-10-CM | POA: Diagnosis present

## 2018-11-23 DIAGNOSIS — Z170 Estrogen receptor positive status [ER+]: Secondary | ICD-10-CM

## 2018-11-23 DIAGNOSIS — Z5112 Encounter for antineoplastic immunotherapy: Secondary | ICD-10-CM | POA: Insufficient documentation

## 2018-11-23 MED ORDER — TRASTUZUMAB-DKST CHEMO 150 MG IV SOLR
450.0000 mg | Freq: Once | INTRAVENOUS | Status: AC
  Administered 2018-11-23: 10:00:00 450 mg via INTRAVENOUS
  Filled 2018-11-23: qty 21.43

## 2018-11-23 MED ORDER — ACETAMINOPHEN 325 MG PO TABS
ORAL_TABLET | ORAL | Status: AC
  Filled 2018-11-23: qty 2

## 2018-11-23 MED ORDER — DIPHENHYDRAMINE HCL 25 MG PO CAPS
ORAL_CAPSULE | ORAL | Status: AC
  Filled 2018-11-23: qty 2

## 2018-11-23 MED ORDER — SODIUM CHLORIDE 0.9 % IV SOLN
Freq: Once | INTRAVENOUS | Status: AC
  Administered 2018-11-23: 09:00:00 via INTRAVENOUS
  Filled 2018-11-23: qty 250

## 2018-11-23 MED ORDER — DIPHENHYDRAMINE HCL 25 MG PO CAPS
50.0000 mg | ORAL_CAPSULE | Freq: Once | ORAL | Status: AC
  Administered 2018-11-23: 50 mg via ORAL

## 2018-11-23 MED ORDER — HEPARIN SOD (PORK) LOCK FLUSH 100 UNIT/ML IV SOLN
500.0000 [IU] | Freq: Once | INTRAVENOUS | Status: AC | PRN
  Administered 2018-11-23: 500 [IU]
  Filled 2018-11-23: qty 5

## 2018-11-23 MED ORDER — SODIUM CHLORIDE 0.9% FLUSH
10.0000 mL | INTRAVENOUS | Status: DC | PRN
  Administered 2018-11-23: 10 mL
  Filled 2018-11-23: qty 10

## 2018-11-23 MED ORDER — ACETAMINOPHEN 325 MG PO TABS
650.0000 mg | ORAL_TABLET | Freq: Once | ORAL | Status: AC
  Administered 2018-11-23: 650 mg via ORAL

## 2018-11-23 NOTE — Patient Instructions (Addendum)
[place] Discharge Instructions for Patients Receiving Chemotherapy  Today you received the following chemotherapy agents: Trastuzumab-dkst Madalyn Rob)  To help prevent nausea and vomiting after your treatment, we encourage you to take your nausea medication as directed.   If you develop nausea and vomiting that is not controlled by your nausea medication, call the clinic.   BELOW ARE SYMPTOMS THAT SHOULD BE REPORTED IMMEDIATELY:  *FEVER GREATER THAN 100.5 F  *CHILLS WITH OR WITHOUT FEVER  NAUSEA AND VOMITING THAT IS NOT CONTROLLED WITH YOUR NAUSEA MEDICATION  *UNUSUAL SHORTNESS OF BREATH  *UNUSUAL BRUISING OR BLEEDING  TENDERNESS IN MOUTH AND THROAT WITH OR WITHOUT PRESENCE OF ULCERS  *URINARY PROBLEMS  *BOWEL PROBLEMS  UNUSUAL RASH Items with * indicate a potential emergency and should be followed up as soon as possible.  Feel free to call the clinic should you have any questions or concerns. The clinic phone number is [phone].  Please show the [place] at check-in to the Emergency Department and triage nurse.  Coronavirus (COVID-19) Are you at risk?  Are you at risk for the Coronavirus (COVID-19)?  To be considered HIGH RISK for Coronavirus (COVID-19), you have to meet the following criteria:  . Traveled to [place], [place], [place], [place] or [place]; or in the [place] to [place], [place], [place], or [place]; and have fever, cough, and shortness of breath within the last 2 weeks of travel OR . Been in close contact with a person diagnosed with COVID-19 within the last 2 weeks and have fever, cough, and shortness of breath . IF YOU DO NOT MEET THESE CRITERIA, YOU ARE CONSIDERED LOW RISK FOR COVID-19.  What to do if you are HIGH RISK for COVID-19?  Marland Kitchen If you are having a medical emergency, call 911. . Seek medical care right away. Before you go to a doctor's office, urgent care or emergency department, call ahead and tell them  about your recent travel, contact with someone diagnosed with COVID-19, and your symptoms. You should receive instructions from your physician's office regarding next steps of care.  . When you arrive at healthcare provider, tell the healthcare staff immediately you have returned from visiting [place], [place], [place], [place] or [place]; or traveled in the [place] to [place], [place], [place], or [place]; in the last two weeks or you have been in close contact with a person diagnosed with COVID-19 in the last 2 weeks.   . Tell the health care staff about your symptoms: fever, cough and shortness of breath. . After you have been seen by a medical provider, you will be either: o Tested for (COVID-19) and discharged home on quarantine except to seek medical care if symptoms worsen, and asked to  - Stay home and avoid contact with others until you get your results (4-5 days)  - Avoid travel on public transportation if possible (such as bus, train, or airplane) or o Sent to the Emergency Department by EMS for evaluation, COVID-19 testing, and possible admission depending on your condition and test results.  What to do if you are LOW RISK for COVID-19?  Reduce your risk of any infection by using the same precautions used for avoiding the common cold or flu:  Marland Kitchen Wash your hands often with soap and warm water for at least 20 seconds.  If soap and water are not readily available, use an alcohol-based hand sanitizer with at least 60% alcohol.  . If coughing or  sneezing, cover your mouth and nose by coughing or sneezing into the elbow areas of your shirt or coat, into a tissue or into your sleeve (not your hands). . Avoid shaking hands with others and consider head nods or verbal greetings only. . Avoid touching your eyes, nose, or mouth with unwashed hands.  . Avoid close contact with people who are sick. . Avoid places or events with large numbers of people in one location, like concerts or  sporting events. . Carefully consider travel plans you have or are making. . If you are planning any travel outside or inside the [place], visit the CDC's Travelers' Health webpage for the latest health notices. . If you have some symptoms but not all symptoms, continue to monitor at home and seek medical attention if your symptoms worsen. . If you are having a medical emergency, call 911.   [place] / e-Visit: [email or web address]         [facility]: [facility]: [phone]                   [facility]: [phone]

## 2018-11-24 ENCOUNTER — Other Ambulatory Visit: Payer: Self-pay

## 2018-11-24 ENCOUNTER — Ambulatory Visit
Admission: RE | Admit: 2018-11-24 | Discharge: 2018-11-24 | Disposition: A | Discharge status: Home / Self Care | Payer: Medicare Other | Source: Ambulatory Visit | Attending: Radiation Oncology | Admitting: Radiation Oncology

## 2018-11-24 DIAGNOSIS — Z170 Estrogen receptor positive status [ER+]: Secondary | ICD-10-CM | POA: Diagnosis not present

## 2018-11-24 DIAGNOSIS — Z510 Encounter for antineoplastic radiation therapy: Secondary | ICD-10-CM | POA: Insufficient documentation

## 2018-11-24 DIAGNOSIS — C50411 Malignant neoplasm of upper-outer quadrant of right female breast: Secondary | ICD-10-CM | POA: Diagnosis not present

## 2018-11-24 NOTE — Progress Notes (Signed)
  Radiation Oncology         [phone]) [id number] ________________________________  Name: Sabrina [name] MRN: [id number]  Date: 11/24/2018  DOB: Apr 28, 1950  SIMULATION AND TREATMENT PLANNING NOTE    Outpatient  DIAGNOSIS:     ICD-10-CM   1. Malignant neoplasm of upper-outer quadrant of right breast in female, estrogen receptor positive ([place])  C50.411    Z17.0     NARRATIVE:  The patient was brought to the [facility].  Identity was confirmed.  All relevant records and images related to the planned course of therapy were reviewed.  The patient freely provided informed written consent to proceed with treatment after reviewing the details related to the planned course of therapy. The consent form was witnessed and verified by the simulation staff.    Then, the patient was set-up in a stable reproducible supine position for radiation therapy with her ipsilateral arm over her head, and her upper body secured in a custom-made Vac-lok device.  CT images were obtained.  Surface markings were placed.  The CT images were loaded into the planning software.    TREATMENT PLANNING NOTE: Treatment planning then occurred.  The radiation prescription was entered and confirmed.     A total of 3 medically necessary complex treatment devices were fabricated and supervised by me: 2 fields with MLCs for custom blocks to protect heart, and lungs;  and, a Vac-lok. MORE COMPLEX DEVICES MAY BE MADE IN DOSIMETRY FOR FIELD IN FIELD BEAMS FOR DOSE HOMOGENEITY.  I have requested : 3D Simulation which is medically necessary to give adequate dose to at risk tissues while sparing lungs and heart.  I have requested a DVH of the following structures: lungs, heart, right lumpectomy cavity.    The patient will receive 40.05 Gy in 15 fractions to the right breast with 2 tangential fields.  This will be followed by a boost.  Optical Surface Tracking Plan:  Since intensity modulated radiotherapy (IMRT) and 3D conformal  radiation treatment methods are predicated on accurate and precise positioning for treatment, intrafraction motion monitoring is medically necessary to ensure accurate and safe treatment delivery. The ability to quantify intrafraction motion without excessive ionizing radiation dose can only be performed with optical surface tracking. Accordingly, surface imaging offers the opportunity to obtain 3D measurements of patient position throughout IMRT and 3D treatments without excessive radiation exposure. I am ordering optical surface tracking for this patient's upcoming course of radiotherapy.  ________________________________   Reference:  Ursula Alert, J, et al. Surface imaging-based analysis of intrafraction motion for breast radiotherapy patients.Journal of [place], n. 6, nov. 2014. ISSN [id number].  Available at: <http://www.jacmp.org/index.php/jacmp/article/view/4957>.    -----------------------------------  Eppie Gibson, MD

## 2018-11-27 DIAGNOSIS — Z510 Encounter for antineoplastic radiation therapy: Secondary | ICD-10-CM | POA: Diagnosis not present

## 2018-12-04 ENCOUNTER — Other Ambulatory Visit: Payer: Self-pay

## 2018-12-04 ENCOUNTER — Ambulatory Visit
Admission: RE | Admit: 2018-12-04 | Discharge: 2018-12-04 | Disposition: A | Discharge status: Home / Self Care | Payer: Medicare Other | Source: Ambulatory Visit | Attending: Radiation Oncology | Admitting: Radiation Oncology

## 2018-12-04 DIAGNOSIS — C50411 Malignant neoplasm of upper-outer quadrant of right female breast: Secondary | ICD-10-CM

## 2018-12-04 DIAGNOSIS — Z510 Encounter for antineoplastic radiation therapy: Secondary | ICD-10-CM | POA: Diagnosis not present

## 2018-12-04 MED ORDER — ALRA NON-METALLIC DEODORANT (RAD-ONC)
1.0000 "application " | Freq: Once | TOPICAL | Status: AC
  Administered 2018-12-04: 1 via TOPICAL

## 2018-12-04 MED ORDER — RADIAPLEXRX EX GEL
Freq: Once | CUTANEOUS | Status: AC
  Administered 2018-12-04: 14:00:00 via TOPICAL

## 2018-12-04 NOTE — Progress Notes (Signed)

## 2018-12-05 ENCOUNTER — Other Ambulatory Visit: Payer: Self-pay

## 2018-12-05 ENCOUNTER — Ambulatory Visit
Admission: RE | Admit: 2018-12-05 | Discharge: 2018-12-05 | Disposition: A | Discharge status: Home / Self Care | Payer: Medicare Other | Source: Ambulatory Visit | Attending: Radiation Oncology | Admitting: Radiation Oncology

## 2018-12-05 DIAGNOSIS — Z510 Encounter for antineoplastic radiation therapy: Secondary | ICD-10-CM | POA: Diagnosis not present

## 2018-12-06 ENCOUNTER — Other Ambulatory Visit: Payer: Self-pay

## 2018-12-06 ENCOUNTER — Ambulatory Visit
Admission: RE | Admit: 2018-12-06 | Discharge: 2018-12-06 | Disposition: A | Discharge status: Home / Self Care | Payer: Medicare Other | Source: Ambulatory Visit | Attending: Radiation Oncology | Admitting: Radiation Oncology

## 2018-12-06 DIAGNOSIS — Z510 Encounter for antineoplastic radiation therapy: Secondary | ICD-10-CM | POA: Diagnosis not present

## 2018-12-07 ENCOUNTER — Ambulatory Visit
Admission: RE | Admit: 2018-12-07 | Discharge: 2018-12-07 | Disposition: A | Discharge status: Home / Self Care | Payer: Medicare Other | Source: Ambulatory Visit | Attending: Radiation Oncology | Admitting: Radiation Oncology

## 2018-12-07 ENCOUNTER — Other Ambulatory Visit: Payer: Self-pay

## 2018-12-07 DIAGNOSIS — Z510 Encounter for antineoplastic radiation therapy: Secondary | ICD-10-CM | POA: Diagnosis not present

## 2018-12-07 NOTE — Assessment & Plan Note (Signed)
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 Treatment plan: 1.Adj chemo with Taxol-Herceptin weekly X 12 completed 11/02/2018 and then q 3 weeks Herceptin. 2.Adjuvant radiation therapy started 12/05/2018 3.Followed by adjuvant antiestrogen therapy ----------------------------------------------------------------------------------------------------------------------------------------------- Current treatment: Herceptin maintenance every 3 weeks No major toxicities to Herceptin therapy.  Return to clinic every 3 weeks for Herceptin every 6 weeks of follow-up with me. Once radiation is complete and she will start antiestrogen therapy.

## 2018-12-08 ENCOUNTER — Other Ambulatory Visit: Payer: Self-pay

## 2018-12-08 ENCOUNTER — Ambulatory Visit
Admission: RE | Admit: 2018-12-08 | Discharge: 2018-12-08 | Disposition: A | Discharge status: Home / Self Care | Payer: Medicare Other | Source: Ambulatory Visit | Attending: Radiation Oncology | Admitting: Radiation Oncology

## 2018-12-08 DIAGNOSIS — Z510 Encounter for antineoplastic radiation therapy: Secondary | ICD-10-CM | POA: Diagnosis not present

## 2018-12-11 ENCOUNTER — Other Ambulatory Visit: Payer: Self-pay

## 2018-12-11 ENCOUNTER — Ambulatory Visit
Admission: RE | Admit: 2018-12-11 | Discharge: 2018-12-11 | Disposition: A | Discharge status: Home / Self Care | Payer: Medicare Other | Source: Ambulatory Visit | Attending: Radiation Oncology | Admitting: Radiation Oncology

## 2018-12-11 DIAGNOSIS — Z510 Encounter for antineoplastic radiation therapy: Secondary | ICD-10-CM | POA: Diagnosis not present

## 2018-12-12 ENCOUNTER — Ambulatory Visit: Payer: Medicare Other

## 2018-12-13 ENCOUNTER — Other Ambulatory Visit: Payer: Self-pay

## 2018-12-13 ENCOUNTER — Ambulatory Visit
Admission: RE | Admit: 2018-12-13 | Discharge: 2018-12-13 | Disposition: A | Discharge status: Home / Self Care | Payer: Medicare Other | Source: Ambulatory Visit | Attending: Radiation Oncology | Admitting: Radiation Oncology

## 2018-12-13 DIAGNOSIS — C50411 Malignant neoplasm of upper-outer quadrant of right female breast: Secondary | ICD-10-CM | POA: Insufficient documentation

## 2018-12-13 DIAGNOSIS — Z170 Estrogen receptor positive status [ER+]: Secondary | ICD-10-CM | POA: Insufficient documentation

## 2018-12-13 DIAGNOSIS — Z510 Encounter for antineoplastic radiation therapy: Secondary | ICD-10-CM | POA: Insufficient documentation

## 2018-12-13 NOTE — Progress Notes (Signed)
Patient Care Team: Sabrina [name]., MD as PCP - General (Family Medicine) Sabrina Kaufmann, RN as Oncology Nurse Navigator Sabrina Germany, RN as Oncology Nurse Navigator Sabrina Luna, MD as Consulting Physician (General Surgery) Sabrina Lose, MD as Consulting Physician (Hematology and Oncology) Sabrina Gibson, MD as Attending Physician (Radiation Oncology)  DIAGNOSIS:    ICD-10-CM   1. Malignant neoplasm of upper-outer quadrant of right breast in female, estrogen receptor positive ([place])  C50.411    Z17.0     SUMMARY OF ONCOLOGIC HISTORY: Oncology History  Malignant neoplasm of upper-outer quadrant of right breast in female, estrogen receptor positive ([place])  06/19/2018 Initial Diagnosis   Screening mammogram detected right breast mass at 9 o'clock position 3 cm from the nipple, ultrasound revealed 7 x 5 x 5 mm mass, axillary ultrasound normal-appearing lymph nodes, ultrasound biopsy: Grade 1 IDC ER 100%, PR 10%, Ki-67 10%, HER-2 3+ positive, T1b N0 stage Ia   06/28/2018 Cancer Staging   Staging form: Breast, AJCC 8th Edition - Clinical stage from 06/28/2018: Stage IA (cT1b, cN0, cM0, G1, ER+, PR+, HER2+) - Signed by Sabrina Lose, MD on 06/28/2018   07/05/2018 Genetic Testing   Negative genetic testing on the common hereditary cancer panel.  The Hereditary Gene Panel offered by [facility] includes sequencing and/or deletion duplication testing of the following 48 genes: APC, ATM, AXIN2, BARD1, BMPR1A, BRCA1, BRCA2, BRIP1, CDH1, CDK4, CDKN2A (p14ARF), CDKN2A (p16INK4a), CHEK2, CTNNA1, DICER1, EPCAM (Deletion/duplication testing only), GREM1 (promoter region deletion/duplication testing only), KIT, MEN1, MLH1, MSH2, MSH3, MSH6, MUTYH, NBN, NF1, NHTL1, PALB2, PDGFRA, PMS2, POLD1, POLE, PTEN, RAD50, RAD51C, RAD51D, RNF43, SDHB, SDHC, SDHD, SMAD4, SMARCA4. STK11, TP53, TSC1, TSC2, and VHL.  The following genes were evaluated for sequence changes only: SDHA and HOXB13 c.251G>A variant only. The  report date is July 05, 2018.   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    08/17/2018 -  Chemotherapy   The patient had trastuzumab (HERCEPTIN) 300 mg in sodium chloride 0.9 % 250 mL chemo infusion, 315 mg, Intravenous,  Once, 3 of 3 cycles Dose modification: 6 mg/kg (original dose 6 mg/kg, Cycle 3, Reason: Other (see comments), Comment: changing to q3wk dosing) Administration: 300 mg (08/17/2018), 150 mg (09/14/2018), 150 mg (08/31/2018), 150 mg (09/07/2018), 150 mg (09/21/2018), 168 mg (09/28/2018), 168 mg (10/05/2018), 168 mg (10/12/2018), 168 mg (10/19/2018), 168 mg (10/27/2018), 150 mg (08/24/2018), 450 mg (11/02/2018) PACLitaxel (TAXOL) 162 mg in sodium chloride 0.9 % 250 mL chemo infusion (</= 54m/m2), 80 mg/m2 = 162 mg, Intravenous,  Once, 3 of 3 cycles Administration: 162 mg (08/17/2018), 162 mg (08/24/2018), 162 mg (09/14/2018), 162 mg (08/31/2018), 162 mg (09/07/2018), 162 mg (09/21/2018), 162 mg (09/28/2018), 162 mg (10/05/2018), 162 mg (10/12/2018), 162 mg (10/19/2018), 162 mg (10/27/2018), 162 mg (11/02/2018) trastuzumab-dkst (OGIVRI) 450 mg in sodium chloride 0.9 % 250 mL chemo infusion, 450 mg (100 % of original dose 450 mg), Intravenous,  Once, 1 of 13 cycles Dose modification: 450 mg (original dose 450 mg, Cycle 4, Reason: Other (see comments), Comment: Biosimilar Conversion) Administration: 450 mg (11/23/2018)  for chemotherapy treatment.    12/05/2018 -  Radiation Therapy   Adjuvant XRT     CHIEF COMPLIANT: Follow-up on Herceptin  INTERVAL HISTORY: Sabrina Bigleris a 68y.o. with above-mentioned history of right breast cancer who underwent a lumpectomy, adjuvant chemotherapy, and is currently on radiation. Echo on  11/13/18 showed an ejection fraction of 55-60%. She presents to the clinic today to receive the Herceptin maintenance therapy.  She has been having difficulty with sleeping.  She also has  some tingling of her extremities.  She is also complaining of weakness in the proximal muscles of the legs.  REVIEW OF SYSTEMS:   Constitutional: Denies fevers, chills or abnormal weight loss Eyes: Denies blurriness of vision Ears, nose, mouth, throat, and face: Denies mucositis or sore throat Respiratory: Denies cough, dyspnea or wheezes Cardiovascular: Denies palpitation, chest discomfort Gastrointestinal: Denies nausea, heartburn or change in bowel habits Skin: Denies abnormal skin rashes Lymphatics: Denies new lymphadenopathy or easy bruising Neurological: Mild tingling of her feet, difficulty with sleep Behavioral/Psych: Mood is stable, no new changes  Extremities: Lower extremity weakness Breast: denies any pain or lumps or nodules in either breasts All other systems were reviewed with the patient and are negative.  I have reviewed the past medical history, past surgical history, social history and family history with the patient and they are unchanged from previous note.  ALLERGIES:  is allergic to atorvastatin and meperidine.  MEDICATIONS:  Current Outpatient Medications  Medication Sig Dispense Refill  . digoxin (LANOXIN) 0.125 MG tablet Take 1 tablet (125 mcg total) by mouth daily. 90 tablet 1  . diphenoxylate-atropine (LOMOTIL) 2.5-0.025 MG tablet Take 1 tablet by mouth 2 (two) times daily as needed for diarrhea or loose stools. 60 tablet 1  . escitalopram (LEXAPRO) 10 MG tablet Take 1 tablet by mouth daily.    . flecainide (TAMBOCOR) 100 MG tablet Take 1 tablet (100 mg total) by mouth 2 (two) times daily. 180 tablet 1  . ibuprofen (ADVIL,MOTRIN) 800 MG tablet Take 1 tablet (800 mg total) by mouth every 8 (eight) hours as needed. (Patient not taking: Reported on 11/10/2018) 30 tablet 0  . levothyroxine (SYNTHROID, LEVOTHROID) 100 MCG tablet Take 1 tablet by mouth daily.    Marland Kitchen lidocaine-prilocaine (EMLA) cream Apply to affected area once 30 g 3  . lisinopril (PRINIVIL,ZESTRIL)  10 MG tablet Take 1 tablet (10 mg total) by mouth daily. 90 tablet 3  . metFORMIN (GLUCOPHAGE) 500 MG tablet Take 1 tablet by mouth 2 (two) times daily with a meal.     . metoprolol succinate (TOPROL-XL) 50 MG 24 hr tablet TAKE 1 TABLET DAILY 90 tablet 1  . ondansetron (ZOFRAN) 8 MG tablet Take 1 tablet (8 mg total) by mouth 2 (two) times daily as needed (Nausea or vomiting). (Patient not taking: Reported on 11/10/2018) 30 tablet 1  . prochlorperazine (COMPAZINE) 10 MG tablet Take 1 tablet (10 mg total) by mouth every 6 (six) hours as needed (Nausea or vomiting). (Patient not taking: Reported on 11/10/2018) 30 tablet 1  . warfarin (COUMADIN) 10 MG tablet Take as directed by [facility] 90 tablet 0   No current facility-administered medications for this visit.     PHYSICAL EXAMINATION: ECOG PERFORMANCE STATUS: 1 - Symptomatic but completely ambulatory  There were no vitals filed for this visit. There were no vitals filed for this visit.  GENERAL: alert, no distress and comfortable SKIN: skin color, texture, turgor are normal, no rashes or significant lesions EYES: normal, Conjunctiva are pink and non-injected, sclera clear OROPHARYNX: no exudate, no erythema and lips, buccal mucosa, and tongue normal  NECK: supple, thyroid normal size, non-tender, without nodularity LYMPH: no palpable lymphadenopathy in the cervical, axillary or inguinal LUNGS: clear to auscultation and percussion with normal breathing effort HEART: regular rate & rhythm and  no murmurs and no lower extremity edema ABDOMEN: abdomen soft, non-tender and normal bowel sounds MUSCULOSKELETAL: no cyanosis of digits and no clubbing  NEURO: alert & oriented x 3 with fluent speech, mild peripheral neuropathy EXTREMITIES: No lower extremity edema  LABORATORY DATA:  I have reviewed the data as listed CMP Latest Ref Rng & Units 11/02/2018 10/27/2018 10/19/2018  Glucose 70 - 99 mg/dL 139(H) 145(H) 117(H)  BUN 8 - 23 mg/dL _0 Creatinine 0.44 - 1.00 mg/dL 0.60 0.60 0.63  Sodium 135 - 145 mmol/L 140 141 141  Potassium 3.5 - 5.1 mmol/L 4.1 4.2 4.5  Chloride 98 - 111 mmol/L 105 105 106  CO2 22 - 32 mmol/L _1 Calcium 8.9 - 10.3 mg/dL 9.3 8.6(L) 9.0  Total Protein 6.5 - 8.1 g/dL 6.5 6.5 7.0  Total Bilirubin 0.3 - 1.2 mg/dL 0.3 <0.2(L) 0.3  Alkaline Phos 38 - 126 U/L 68 69 74  AST 15 - 41 U/L 12(L) 13(L) 16  ALT 0 - 44 U/L _2 Lab Results  Component Value Date   WBC 4.9 11/02/2018   HGB 11.5 (L) 11/02/2018   HCT 36.7 11/02/2018   MCV 93.1 11/02/2018   PLT 191 11/02/2018   NEUTROABS 3.1 11/02/2018    ASSESSMENT & PLAN:  Malignant neoplasm of upper-outer quadrant of right breast in female, estrogen receptor positive ([place]) 07/13/2018:Right lumpectomy ([facility]): IDC with DCIS, 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 Treatment plan: 1.Adj chemo with Taxol-Herceptin weekly X 12 completed 11/02/2018 and then q 3 weeks Herceptin. 2.Adjuvant radiation therapy started 12/05/2018 3.Followed by adjuvant antiestrogen therapy ----------------------------------------------------------------------------------------------------------------------------------------------- Current treatment: Herceptin maintenance every 3 weeks No major toxicities to Herceptin therapy.  Return to clinic every 3 weeks for Herceptin every 6 weeks of follow-up with me. Once radiation is complete and she will start antiestrogen therapy. I discussed with her about exercises to strengthen her lower extremities. Insomnia: I discussed with her the importance of sleep hygiene and all the activities that she needs to do to fall asleep.  We will try to avoid sleep aids if possible.    No orders of the defined types were placed in this encounter.  The patient has a good understanding of the overall plan. she agrees with it. she will call with any problems that may develop before the  next visit here.  Sabrina Lose, MD 12/14/2018  Julious Oka Sabrina [name] am acting as scribe for Dr. Nicholas [name].  I have reviewed the above documentation for accuracy and completeness, and I agree with the above.

## 2018-12-14 ENCOUNTER — Inpatient Hospital Stay: Payer: Medicare Other

## 2018-12-14 ENCOUNTER — Ambulatory Visit
Admission: RE | Admit: 2018-12-14 | Discharge: 2018-12-14 | Disposition: A | Discharge status: Home / Self Care | Payer: Medicare Other | Source: Ambulatory Visit | Attending: Radiation Oncology | Admitting: Radiation Oncology

## 2018-12-14 ENCOUNTER — Other Ambulatory Visit: Payer: Self-pay

## 2018-12-14 ENCOUNTER — Encounter: Payer: Self-pay | Admitting: *Deleted

## 2018-12-14 ENCOUNTER — Inpatient Hospital Stay: Payer: Medicare Other | Attending: Hematology and Oncology

## 2018-12-14 ENCOUNTER — Inpatient Hospital Stay (HOSPITAL_BASED_OUTPATIENT_CLINIC_OR_DEPARTMENT_OTHER): Payer: Medicare Other | Admitting: Hematology and Oncology

## 2018-12-14 DIAGNOSIS — Z79899 Other long term (current) drug therapy: Secondary | ICD-10-CM | POA: Insufficient documentation

## 2018-12-14 DIAGNOSIS — C50411 Malignant neoplasm of upper-outer quadrant of right female breast: Secondary | ICD-10-CM

## 2018-12-14 DIAGNOSIS — Z9221 Personal history of antineoplastic chemotherapy: Secondary | ICD-10-CM | POA: Diagnosis not present

## 2018-12-14 DIAGNOSIS — Z170 Estrogen receptor positive status [ER+]: Secondary | ICD-10-CM

## 2018-12-14 DIAGNOSIS — Z791 Long term (current) use of non-steroidal anti-inflammatories (NSAID): Secondary | ICD-10-CM | POA: Insufficient documentation

## 2018-12-14 DIAGNOSIS — Z7901 Long term (current) use of anticoagulants: Secondary | ICD-10-CM | POA: Insufficient documentation

## 2018-12-14 DIAGNOSIS — Z5112 Encounter for antineoplastic immunotherapy: Secondary | ICD-10-CM | POA: Insufficient documentation

## 2018-12-14 DIAGNOSIS — Z7984 Long term (current) use of oral hypoglycemic drugs: Secondary | ICD-10-CM | POA: Diagnosis not present

## 2018-12-14 DIAGNOSIS — Z95828 Presence of other vascular implants and grafts: Secondary | ICD-10-CM

## 2018-12-14 DIAGNOSIS — Z923 Personal history of irradiation: Secondary | ICD-10-CM | POA: Insufficient documentation

## 2018-12-14 DIAGNOSIS — Z510 Encounter for antineoplastic radiation therapy: Secondary | ICD-10-CM | POA: Diagnosis not present

## 2018-12-14 LAB — CBC WITH DIFFERENTIAL (CANCER CENTER ONLY)
Abs Immature Granulocytes: 0.02 10*3/uL (ref 0.00–0.07)
Basophils Absolute: 0 10*3/uL (ref 0.0–0.1)
Basophils Relative: 1 %
Eosinophils Absolute: 0.1 10*3/uL (ref 0.0–0.5)
Eosinophils Relative: 1 %
HCT: 34.8 % — ABNORMAL LOW (ref 36.0–46.0)
Hemoglobin: 10.6 g/dL — ABNORMAL LOW (ref 12.0–15.0)
Immature Granulocytes: 0 %
Lymphocytes Relative: 19 %
Lymphs Abs: 1.1 10*3/uL (ref 0.7–4.0)
MCH: 29 pg (ref 26.0–34.0)
MCHC: 30.5 g/dL (ref 30.0–36.0)
MCV: 95.1 fL (ref 80.0–100.0)
Monocytes Absolute: 0.5 10*3/uL (ref 0.1–1.0)
Monocytes Relative: 9 %
Neutro Abs: 4.2 10*3/uL (ref 1.7–7.7)
Neutrophils Relative %: 70 %
Platelet Count: 158 10*3/uL (ref 150–400)
RBC: 3.66 MIL/uL — ABNORMAL LOW (ref 3.87–5.11)
RDW: 14.2 % (ref 11.5–15.5)
WBC Count: 6 10*3/uL (ref 4.0–10.5)
nRBC: 0 % (ref 0.0–0.2)

## 2018-12-14 LAB — CMP (CANCER CENTER ONLY)
ALT: 11 U/L (ref 0–44)
AST: 15 U/L (ref 15–41)
Albumin: 3.7 g/dL (ref 3.5–5.0)
Alkaline Phosphatase: 79 U/L (ref 38–126)
Anion gap: 6 (ref 5–15)
BUN: 13 mg/dL (ref 8–23)
CO2: 29 mmol/L (ref 22–32)
Calcium: 8.5 mg/dL — ABNORMAL LOW (ref 8.9–10.3)
Chloride: 105 mmol/L (ref 98–111)
Creatinine: 0.59 mg/dL (ref 0.44–1.00)
GFR, Est AFR Am: >60 mL/min (ref >60)
GFR, Estimated: >60 mL/min (ref >60)
Glucose, Bld: 160 mg/dL — ABNORMAL HIGH (ref 70–99)
Potassium: 4 mmol/L (ref 3.5–5.1)
Sodium: 140 mmol/L (ref 135–145)
Total Bilirubin: <0.2 mg/dL — ABNORMAL LOW (ref 0.3–1.2)
Total Protein: 6.3 g/dL — ABNORMAL LOW (ref 6.5–8.1)

## 2018-12-14 MED ORDER — TRASTUZUMAB-DKST CHEMO 150 MG IV SOLR
450.0000 mg | Freq: Once | INTRAVENOUS | Status: AC
  Administered 2018-12-14: 450 mg via INTRAVENOUS
  Filled 2018-12-14: qty 21.43

## 2018-12-14 MED ORDER — ACETAMINOPHEN 325 MG PO TABS
ORAL_TABLET | ORAL | Status: AC
  Filled 2018-12-14: qty 2

## 2018-12-14 MED ORDER — SODIUM CHLORIDE 0.9 % IV SOLN
Freq: Once | INTRAVENOUS | Status: AC
  Administered 2018-12-14: 11:00:00 via INTRAVENOUS
  Filled 2018-12-14: qty 250

## 2018-12-14 MED ORDER — SODIUM CHLORIDE 0.9% FLUSH
10.0000 mL | Freq: Once | INTRAVENOUS | Status: AC
  Administered 2018-12-14: 10:00:00 10 mL
  Filled 2018-12-14: qty 10

## 2018-12-14 MED ORDER — SODIUM CHLORIDE 0.9% FLUSH
10.0000 mL | INTRAVENOUS | Status: DC | PRN
  Administered 2018-12-14: 10 mL
  Filled 2018-12-14: qty 10

## 2018-12-14 MED ORDER — DIPHENHYDRAMINE HCL 25 MG PO CAPS
50.0000 mg | ORAL_CAPSULE | Freq: Once | ORAL | Status: AC
  Administered 2018-12-14: 50 mg via ORAL

## 2018-12-14 MED ORDER — ACETAMINOPHEN 325 MG PO TABS
650.0000 mg | ORAL_TABLET | Freq: Once | ORAL | Status: AC
  Administered 2018-12-14: 650 mg via ORAL

## 2018-12-14 MED ORDER — HEPARIN SOD (PORK) LOCK FLUSH 100 UNIT/ML IV SOLN
500.0000 [IU] | Freq: Once | INTRAVENOUS | Status: AC | PRN
  Administered 2018-12-14: 500 [IU]
  Filled 2018-12-14: qty 5

## 2018-12-14 MED ORDER — DIPHENHYDRAMINE HCL 25 MG PO CAPS
ORAL_CAPSULE | ORAL | Status: AC
  Filled 2018-12-14: qty 2

## 2018-12-14 NOTE — Patient Instructions (Signed)
[facility] °Discharge Instructions for Patients Receiving Chemotherapy ° °Today you received the following chemotherapy agents Trastuzumab ° °To help prevent nausea and vomiting after your treatment, we encourage you to take your nausea medication as directed. °  °If you develop nausea and vomiting that is not controlled by your nausea medication, call the clinic.  ° °BELOW ARE SYMPTOMS THAT SHOULD BE REPORTED IMMEDIATELY: °· *FEVER GREATER THAN 100.5 F °· *CHILLS WITH OR WITHOUT FEVER °· NAUSEA AND VOMITING THAT IS NOT CONTROLLED WITH YOUR NAUSEA MEDICATION °· *UNUSUAL SHORTNESS OF BREATH °· *UNUSUAL BRUISING OR BLEEDING °· TENDERNESS IN MOUTH AND THROAT WITH OR WITHOUT PRESENCE OF ULCERS °· *URINARY PROBLEMS °· *BOWEL PROBLEMS °· UNUSUAL RASH °Items with * indicate a potential emergency and should be followed up as soon as possible. ° °Feel free to call the clinic should you have any questions or concerns. The clinic phone number is [phone]. ° °Please show the CHEMO ALERT CARD at check-in to the Emergency Department and triage nurse. ° ° °

## 2018-12-15 ENCOUNTER — Other Ambulatory Visit: Payer: Self-pay

## 2018-12-15 ENCOUNTER — Ambulatory Visit
Admission: RE | Admit: 2018-12-15 | Discharge: 2018-12-15 | Disposition: A | Discharge status: Home / Self Care | Payer: Medicare Other | Source: Ambulatory Visit | Attending: Radiation Oncology | Admitting: Radiation Oncology

## 2018-12-15 DIAGNOSIS — Z510 Encounter for antineoplastic radiation therapy: Secondary | ICD-10-CM | POA: Diagnosis not present

## 2018-12-19 ENCOUNTER — Ambulatory Visit
Admission: RE | Admit: 2018-12-19 | Discharge: 2018-12-19 | Disposition: A | Discharge status: Home / Self Care | Payer: Medicare Other | Source: Ambulatory Visit | Attending: Radiation Oncology | Admitting: Radiation Oncology

## 2018-12-19 ENCOUNTER — Ambulatory Visit (INDEPENDENT_AMBULATORY_CARE_PROVIDER_SITE_OTHER): Payer: Medicare Other | Admitting: *Deleted

## 2018-12-19 ENCOUNTER — Other Ambulatory Visit: Payer: Self-pay

## 2018-12-19 DIAGNOSIS — Z5181 Encounter for therapeutic drug level monitoring: Secondary | ICD-10-CM

## 2018-12-19 DIAGNOSIS — Z7901 Long term (current) use of anticoagulants: Secondary | ICD-10-CM

## 2018-12-19 DIAGNOSIS — I480 Paroxysmal atrial fibrillation: Secondary | ICD-10-CM | POA: Diagnosis not present

## 2018-12-19 DIAGNOSIS — Z510 Encounter for antineoplastic radiation therapy: Secondary | ICD-10-CM | POA: Diagnosis not present

## 2018-12-19 LAB — POCT INR: INR: 2 (ref 2.0–3.0)

## 2018-12-19 NOTE — Patient Instructions (Addendum)
Description   Continue 10mg  daily except 5mg  on Mondays, Wednesdays and Saturdays. Continue eating 2 servings of leafy green vegetables each week. Recheck INR in 7 weeks.

## 2018-12-20 ENCOUNTER — Other Ambulatory Visit: Payer: Self-pay

## 2018-12-20 ENCOUNTER — Ambulatory Visit
Admission: RE | Admit: 2018-12-20 | Discharge: 2018-12-20 | Disposition: A | Discharge status: Home / Self Care | Payer: Medicare Other | Source: Ambulatory Visit | Attending: Radiation Oncology | Admitting: Radiation Oncology

## 2018-12-20 DIAGNOSIS — Z510 Encounter for antineoplastic radiation therapy: Secondary | ICD-10-CM | POA: Diagnosis not present

## 2018-12-21 ENCOUNTER — Ambulatory Visit
Admission: RE | Admit: 2018-12-21 | Discharge: 2018-12-21 | Disposition: A | Discharge status: Home / Self Care | Payer: Medicare Other | Source: Ambulatory Visit | Attending: Radiation Oncology | Admitting: Radiation Oncology

## 2018-12-21 ENCOUNTER — Other Ambulatory Visit: Payer: Self-pay

## 2018-12-21 DIAGNOSIS — Z510 Encounter for antineoplastic radiation therapy: Secondary | ICD-10-CM | POA: Diagnosis not present

## 2018-12-22 ENCOUNTER — Ambulatory Visit
Admission: RE | Admit: 2018-12-22 | Discharge: 2018-12-22 | Disposition: A | Discharge status: Home / Self Care | Payer: Medicare Other | Source: Ambulatory Visit | Attending: Radiation Oncology | Admitting: Radiation Oncology

## 2018-12-22 ENCOUNTER — Other Ambulatory Visit: Payer: Self-pay

## 2018-12-22 DIAGNOSIS — C50411 Malignant neoplasm of upper-outer quadrant of right female breast: Secondary | ICD-10-CM

## 2018-12-22 DIAGNOSIS — Z510 Encounter for antineoplastic radiation therapy: Secondary | ICD-10-CM | POA: Diagnosis not present

## 2018-12-22 MED ORDER — RADIAPLEXRX EX GEL
Freq: Once | CUTANEOUS | Status: AC
  Administered 2018-12-22: 14:00:00 via TOPICAL

## 2018-12-25 ENCOUNTER — Ambulatory Visit
Admission: RE | Admit: 2018-12-25 | Discharge: 2018-12-25 | Disposition: A | Discharge status: Home / Self Care | Payer: Medicare Other | Source: Ambulatory Visit | Attending: Radiation Oncology | Admitting: Radiation Oncology

## 2018-12-25 ENCOUNTER — Other Ambulatory Visit: Payer: Self-pay

## 2018-12-25 DIAGNOSIS — Z510 Encounter for antineoplastic radiation therapy: Secondary | ICD-10-CM | POA: Diagnosis not present

## 2018-12-26 ENCOUNTER — Ambulatory Visit
Admission: RE | Admit: 2018-12-26 | Discharge: 2018-12-26 | Disposition: A | Discharge status: Home / Self Care | Payer: Medicare Other | Source: Ambulatory Visit | Attending: Radiation Oncology | Admitting: Radiation Oncology

## 2018-12-26 ENCOUNTER — Ambulatory Visit: Payer: Medicare Other

## 2018-12-26 ENCOUNTER — Other Ambulatory Visit: Payer: Self-pay

## 2018-12-26 DIAGNOSIS — Z510 Encounter for antineoplastic radiation therapy: Secondary | ICD-10-CM | POA: Diagnosis not present

## 2018-12-27 ENCOUNTER — Other Ambulatory Visit: Payer: Self-pay

## 2018-12-27 ENCOUNTER — Ambulatory Visit: Payer: Medicare Other

## 2018-12-27 ENCOUNTER — Ambulatory Visit
Admission: RE | Admit: 2018-12-27 | Discharge: 2018-12-27 | Disposition: A | Discharge status: Home / Self Care | Payer: Medicare Other | Source: Ambulatory Visit | Attending: Radiation Oncology | Admitting: Radiation Oncology

## 2018-12-27 DIAGNOSIS — Z510 Encounter for antineoplastic radiation therapy: Secondary | ICD-10-CM | POA: Diagnosis not present

## 2018-12-28 ENCOUNTER — Other Ambulatory Visit: Payer: Self-pay

## 2018-12-28 ENCOUNTER — Ambulatory Visit
Admission: RE | Admit: 2018-12-28 | Discharge: 2018-12-28 | Disposition: A | Discharge status: Home / Self Care | Payer: Medicare Other | Source: Ambulatory Visit | Attending: Radiation Oncology | Admitting: Radiation Oncology

## 2018-12-28 DIAGNOSIS — Z510 Encounter for antineoplastic radiation therapy: Secondary | ICD-10-CM | POA: Diagnosis not present

## 2018-12-29 ENCOUNTER — Other Ambulatory Visit: Payer: Self-pay

## 2018-12-29 ENCOUNTER — Ambulatory Visit
Admission: RE | Admit: 2018-12-29 | Discharge: 2018-12-29 | Disposition: A | Discharge status: Home / Self Care | Payer: Medicare Other | Source: Ambulatory Visit | Attending: Radiation Oncology | Admitting: Radiation Oncology

## 2018-12-29 DIAGNOSIS — Z510 Encounter for antineoplastic radiation therapy: Secondary | ICD-10-CM | POA: Diagnosis not present

## 2019-01-01 ENCOUNTER — Ambulatory Visit
Admission: RE | Admit: 2019-01-01 | Discharge: 2019-01-01 | Disposition: A | Discharge status: Home / Self Care | Payer: Medicare Other | Source: Ambulatory Visit | Attending: Radiation Oncology | Admitting: Radiation Oncology

## 2019-01-01 ENCOUNTER — Other Ambulatory Visit: Payer: Self-pay

## 2019-01-01 ENCOUNTER — Ambulatory Visit: Payer: Medicare Other

## 2019-01-01 DIAGNOSIS — Z170 Estrogen receptor positive status [ER+]: Secondary | ICD-10-CM

## 2019-01-01 DIAGNOSIS — Z510 Encounter for antineoplastic radiation therapy: Secondary | ICD-10-CM | POA: Diagnosis not present

## 2019-01-01 DIAGNOSIS — C50411 Malignant neoplasm of upper-outer quadrant of right female breast: Secondary | ICD-10-CM

## 2019-01-01 MED ORDER — RADIAPLEXRX EX GEL
Freq: Once | CUTANEOUS | Status: AC
  Administered 2019-01-01: 14:00:00 via TOPICAL

## 2019-01-02 ENCOUNTER — Encounter: Payer: Self-pay | Admitting: *Deleted

## 2019-01-02 ENCOUNTER — Ambulatory Visit
Admission: RE | Admit: 2019-01-02 | Discharge: 2019-01-02 | Disposition: A | Discharge status: Home / Self Care | Payer: Medicare Other | Source: Ambulatory Visit | Attending: Radiation Oncology | Admitting: Radiation Oncology

## 2019-01-02 ENCOUNTER — Encounter: Payer: Self-pay | Admitting: Radiation Oncology

## 2019-01-02 ENCOUNTER — Other Ambulatory Visit: Payer: Self-pay

## 2019-01-02 DIAGNOSIS — Z510 Encounter for antineoplastic radiation therapy: Secondary | ICD-10-CM | POA: Diagnosis not present

## 2019-01-03 ENCOUNTER — Telehealth: Payer: Self-pay | Admitting: *Deleted

## 2019-01-03 ENCOUNTER — Other Ambulatory Visit: Payer: Self-pay | Admitting: Cardiology

## 2019-01-03 ENCOUNTER — Other Ambulatory Visit: Payer: Self-pay | Admitting: Hematology and Oncology

## 2019-01-03 MED ORDER — ALPRAZOLAM 0.25 MG PO TABS: 0.2500 mg | ORAL_TABLET | Freq: Every evening | ORAL | 0 refills | Status: DC | PRN

## 2019-01-03 NOTE — Telephone Encounter (Signed)
Pt states that she is continuing to not sleep well at night.  Pt denies caffeine intake and decreases screen time.   Pt states she has tried several OTC medications incuding Unisom, Melatonin, and Benadryl with no relief.   Requesting sleep aid medication be sent to her pharmacy.  RN will review with Dr. Lindi Adie for recommendations.

## 2019-01-04 ENCOUNTER — Other Ambulatory Visit: Payer: Self-pay

## 2019-01-04 ENCOUNTER — Inpatient Hospital Stay: Payer: Medicare Other

## 2019-01-04 VITALS — BP 166/78 | HR 86 | Temp 98.6°F | Resp 16 | Ht 69.0 in | Wt 177.5 lb

## 2019-01-04 DIAGNOSIS — Z5112 Encounter for antineoplastic immunotherapy: Secondary | ICD-10-CM | POA: Diagnosis not present

## 2019-01-04 DIAGNOSIS — Z170 Estrogen receptor positive status [ER+]: Secondary | ICD-10-CM

## 2019-01-04 DIAGNOSIS — C50411 Malignant neoplasm of upper-outer quadrant of right female breast: Secondary | ICD-10-CM

## 2019-01-04 MED ORDER — SODIUM CHLORIDE 0.9% FLUSH
10.0000 mL | INTRAVENOUS | Status: DC | PRN
  Administered 2019-01-04: 10 mL
  Filled 2019-01-04: qty 10

## 2019-01-04 MED ORDER — ACETAMINOPHEN 325 MG PO TABS
ORAL_TABLET | ORAL | Status: AC
  Filled 2019-01-04: qty 2

## 2019-01-04 MED ORDER — DIPHENHYDRAMINE HCL 25 MG PO CAPS
50.0000 mg | ORAL_CAPSULE | Freq: Once | ORAL | Status: AC
  Administered 2019-01-04: 50 mg via ORAL

## 2019-01-04 MED ORDER — DIPHENHYDRAMINE HCL 25 MG PO CAPS
ORAL_CAPSULE | ORAL | Status: AC
  Filled 2019-01-04: qty 2

## 2019-01-04 MED ORDER — HEPARIN SOD (PORK) LOCK FLUSH 100 UNIT/ML IV SOLN
500.0000 [IU] | Freq: Once | INTRAVENOUS | Status: AC | PRN
  Administered 2019-01-04: 11:00:00 500 [IU]
  Filled 2019-01-04: qty 5

## 2019-01-04 MED ORDER — SODIUM CHLORIDE 0.9 % IV SOLN
Freq: Once | INTRAVENOUS | Status: AC
  Administered 2019-01-04: 09:00:00 via INTRAVENOUS
  Filled 2019-01-04: qty 250

## 2019-01-04 MED ORDER — TRASTUZUMAB-DKST CHEMO 150 MG IV SOLR
450.0000 mg | Freq: Once | INTRAVENOUS | Status: AC
  Administered 2019-01-04: 450 mg via INTRAVENOUS
  Filled 2019-01-04: qty 21.43

## 2019-01-04 MED ORDER — ACETAMINOPHEN 325 MG PO TABS
650.0000 mg | ORAL_TABLET | Freq: Once | ORAL | Status: AC
  Administered 2019-01-04: 650 mg via ORAL

## 2019-01-04 NOTE — Telephone Encounter (Signed)
Pleas refill  Thank you

## 2019-01-04 NOTE — Telephone Encounter (Signed)
Drug-Drug: warfarin and alteplase Co-administration of TPA Thrombolytics to patients already receiving Anticoagulants may result in an increased risk of bleeding. Coadministration may be contraindicated or relatively contraindicated in official package labeling depending on the therapeutic indication. It should be noted that use of tissue plasminogen activator for catheter clearance is unlikely to result in a significant interaction. PHARMD REVIEW NEEDED

## 2019-01-24 NOTE — Progress Notes (Signed)
 Patient Care Team: Slatosky, John J., MD as PCP - General (Family Medicine) Stuart, Dawn C, RN as Oncology Nurse Navigator Martini, Keisha N, RN as Oncology Nurse Navigator Cornett, Thomas, MD as Consulting Physician (General Surgery) Gudena, Vinay, MD as Consulting Physician (Hematology and Oncology) Squire, Sarah, MD as Attending Physician (Radiation Oncology)  DIAGNOSIS:    ICD-10-CM   1. Malignant neoplasm of upper-outer quadrant of right breast in female, estrogen receptor positive ([facility])  C50.411    Z17.0     SUMMARY OF ONCOLOGIC HISTORY: Oncology History  Malignant neoplasm of upper-outer quadrant of right breast in female, estrogen receptor positive ([facility])  06/19/2018 Initial Diagnosis   Screening mammogram detected right breast mass at 9 o'clock position 3 cm from the nipple, ultrasound revealed 7 x 5 x 5 mm mass, axillary ultrasound normal-appearing lymph nodes, ultrasound biopsy: Grade 1 IDC ER 100%, PR 10%, Ki-67 10%, HER-2 3+ positive, T1b N0 stage Ia   06/28/2018 Cancer Staging   Staging form: Breast, AJCC 8th Edition - Clinical stage from 06/28/2018: Stage IA (cT1b, cN0, cM0, G1, ER+, PR+, HER2+) - Signed by Gudena, Vinay, MD on 06/28/2018   07/05/2018 Genetic Testing   Negative genetic testing on the common hereditary cancer panel.  The Hereditary Gene Panel offered by [facility] includes sequencing and/or deletion duplication testing of the following 48 genes: APC, ATM, AXIN2, BARD1, BMPR1A, BRCA1, BRCA2, BRIP1, CDH1, CDK4, CDKN2A (p14ARF), CDKN2A (p16INK4a), CHEK2, CTNNA1, DICER1, EPCAM (Deletion/duplication testing only), GREM1 (promoter region deletion/duplication testing only), KIT, MEN1, MLH1, MSH2, MSH3, MSH6, MUTYH, NBN, NF1, NHTL1, PALB2, PDGFRA, PMS2, POLD1, POLE, PTEN, RAD50, RAD51C, RAD51D, RNF43, SDHB, SDHC, SDHD, SMAD4, SMARCA4. STK11, TP53, TSC1, TSC2, and VHL.  The following genes were evaluated for sequence changes only: SDHA and HOXB13 c.251G>A variant only. The  report date is July 05, 2018.   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    08/17/2018 -  Chemotherapy   The patient had trastuzumab (HERCEPTIN) 300 mg in sodium chloride 0.9 % 250 mL chemo infusion, 315 mg, Intravenous,  Once, 3 of 3 cycles Dose modification: 6 mg/kg (original dose 6 mg/kg, Cycle 3, Reason: Other (see comments), Comment: changing to q3wk dosing) Administration: 300 mg (08/17/2018), 150 mg (09/14/2018), 150 mg (08/31/2018), 150 mg (09/07/2018), 150 mg (09/21/2018), 168 mg (09/28/2018), 168 mg (10/05/2018), 168 mg (10/12/2018), 168 mg (10/19/2018), 168 mg (10/27/2018), 150 mg (08/24/2018), 450 mg (11/02/2018) PACLitaxel (TAXOL) 162 mg in sodium chloride 0.9 % 250 mL chemo infusion (</= 80mg/m2), 80 mg/m2 = 162 mg, Intravenous,  Once, 3 of 3 cycles Administration: 162 mg (08/17/2018), 162 mg (08/24/2018), 162 mg (09/14/2018), 162 mg (08/31/2018), 162 mg (09/07/2018), 162 mg (09/21/2018), 162 mg (09/28/2018), 162 mg (10/05/2018), 162 mg (10/12/2018), 162 mg (10/19/2018), 162 mg (10/27/2018), 162 mg (11/02/2018) trastuzumab-dkst (OGIVRI) 450 mg in sodium chloride 0.9 % 250 mL chemo infusion, 450 mg (100 % of original dose 450 mg), Intravenous,  Once, 3 of 13 cycles Dose modification: 450 mg (original dose 450 mg, Cycle 4, Reason: Other (see comments), Comment: Biosimilar Conversion) Administration: 450 mg (11/23/2018), 450 mg (12/14/2018), 450 mg (01/04/2019)  for chemotherapy treatment.    12/05/2018 - 01/02/2019 Radiation Therapy   Adjuvant XRT     CHIEF COMPLIANT: Follow-up on Herceptin  INTERVAL HISTORY: Sabrina [name] is a 68 y.o. with above-mentioned history of right breast cancer who underwent a lumpectomy, adjuvant chemotherapy, and   completed radiation therapy on 01/02/19. She is currently on treatment with Herceptin maintenance. She presents to the clinic today for treatment and to discuss anti-estrogen  therapy.  She reports recovering from radiation therapy.  Scabs have fallen off.  She is fatigued but her energy levels are coming back.  REVIEW OF SYSTEMS:   Constitutional: Denies fevers, chills or abnormal weight loss Eyes: Denies blurriness of vision Ears, nose, mouth, throat, and face: Denies mucositis or sore throat Respiratory: Denies cough, dyspnea or wheezes Cardiovascular: Denies palpitation, chest discomfort Gastrointestinal: Denies nausea, heartburn or change in bowel habits Skin: Denies abnormal skin rashes Lymphatics: Denies new lymphadenopathy or easy bruising Neurological: Denies numbness, tingling or new weaknesses Behavioral/Psych: Mood is stable, no new changes  Extremities: No lower extremity edema Breast: Radiation dermatitis is improving. All other systems were reviewed with the patient and are negative.  I have reviewed the past medical history, past surgical history, social history and family history with the patient and they are unchanged from previous note.  ALLERGIES:  is allergic to atorvastatin and meperidine.  MEDICATIONS:  Current Outpatient Medications  Medication Sig Dispense Refill  . ALPRAZolam (XANAX) 0.25 MG tablet Take 1 tablet (0.25 mg total) by mouth at bedtime as needed for anxiety. 30 tablet 0  . digoxin (LANOXIN) 0.125 MG tablet Take 1 tablet (125 mcg total) by mouth daily. 90 tablet 1  . diphenoxylate-atropine (LOMOTIL) 2.5-0.025 MG tablet Take 1 tablet by mouth 2 (two) times daily as needed for diarrhea or loose stools. 60 tablet 1  . escitalopram (LEXAPRO) 10 MG tablet Take 1 tablet by mouth daily.    . flecainide (TAMBOCOR) 100 MG tablet Take 1 tablet (100 mg total) by mouth 2 (two) times daily. 180 tablet 1  . ibuprofen (ADVIL,MOTRIN) 800 MG tablet Take 1 tablet (800 mg total) by mouth every 8 (eight) hours as needed. (Patient not taking: Reported on 11/10/2018) 30 tablet 0  . levothyroxine (SYNTHROID, LEVOTHROID) 100 MCG tablet Take 1  tablet by mouth daily.    Marland Kitchen lidocaine-prilocaine (EMLA) cream Apply to affected area once 30 g 3  . lisinopril (PRINIVIL,ZESTRIL) 10 MG tablet Take 1 tablet (10 mg total) by mouth daily. 90 tablet 3  . metFORMIN (GLUCOPHAGE) 500 MG tablet Take 1 tablet by mouth 2 (two) times daily with a meal.     . metoprolol succinate (TOPROL-XL) 50 MG 24 hr tablet TAKE 1 TABLET DAILY 90 tablet 1  . ondansetron (ZOFRAN) 8 MG tablet Take 1 tablet (8 mg total) by mouth 2 (two) times daily as needed (Nausea or vomiting). (Patient not taking: Reported on 11/10/2018) 30 tablet 1  . prochlorperazine (COMPAZINE) 10 MG tablet Take 1 tablet (10 mg total) by mouth every 6 (six) hours as needed (Nausea or vomiting). (Patient not taking: Reported on 11/10/2018) 30 tablet 1  . warfarin (COUMADIN) 10 MG tablet Take 1/2 to 1 tablet as directed by [facility] 90 tablet 3   No current facility-administered medications for this visit.     PHYSICAL EXAMINATION: ECOG PERFORMANCE STATUS: 1 - Symptomatic but completely ambulatory  Vitals:   01/25/19 1038  BP: (!) 166/62  Pulse: 73  Resp: 18  Temp: 98 F (36.7 C)  SpO2: 96%   Filed Weights   01/25/19 1038  Weight: 179 lb 9.6 oz (81.5 kg)    GENERAL: alert, no distress and comfortable SKIN: skin color, texture, turgor are normal, no rashes or significant lesions EYES: normal, Conjunctiva are pink and non-injected, sclera clear OROPHARYNX:  no exudate, no erythema and lips, buccal mucosa, and tongue normal  NECK: supple, thyroid normal size, non-tender, without nodularity LYMPH: no palpable lymphadenopathy in the cervical, axillary or inguinal LUNGS: clear to auscultation and percussion with normal breathing effort HEART: regular rate & rhythm and no murmurs and no lower extremity edema ABDOMEN: abdomen soft, non-tender and normal bowel sounds MUSCULOSKELETAL: no cyanosis of digits and no clubbing  NEURO: alert & oriented x 3 with fluent speech, no focal  motor/sensory deficits EXTREMITIES: No lower extremity edema  LABORATORY DATA:  I have reviewed the data as listed CMP Latest Ref Rng & Units 12/14/2018 11/02/2018 10/27/2018  Glucose 70 - 99 mg/dL 160(H) 139(H) 145(H)  BUN 8 - 23 mg/dL _0 Creatinine 0.44 - 1.00 mg/dL 0.59 0.60 0.60  Sodium 135 - 145 mmol/L 140 140 141  Potassium 3.5 - 5.1 mmol/L 4.0 4.1 4.2  Chloride 98 - 111 mmol/L 105 105 105  CO2 22 - 32 mmol/L _1 Calcium 8.9 - 10.3 mg/dL 8.5(L) 9.3 8.6(L)  Total Protein 6.5 - 8.1 g/dL 6.3(L) 6.5 6.5  Total Bilirubin 0.3 - 1.2 mg/dL <0.2(L) 0.3 <0.2(L)  Alkaline Phos 38 - 126 U/L 79 68 69  AST 15 - 41 U/L 15 12(L) 13(L)  ALT 0 - 44 U/L _2 Lab Results  Component Value Date   WBC 5.9 01/25/2019   HGB 10.2 (L) 01/25/2019   HCT 33.5 (L) 01/25/2019   MCV 87.2 01/25/2019   PLT 166 01/25/2019   NEUTROABS 4.2 01/25/2019    ASSESSMENT & PLAN:  Malignant neoplasm of upper-outer quadrant of right breast in female, estrogen receptor positive ([place]) 07/13/2018:Right lumpectomy ([facility]): IDC with DCIS, 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  Treatment plan: 1.Adj chemo with Taxol-Herceptin weekly X 12 completed 11/02/2018 and then q 3 weeks Herceptin. 2.Adjuvant radiation therapy started 12/05/2018-01/02/19 3.Followed by adjuvant antiestrogen therapy ----------------------------------------------------------------------------------------------------------------------------------------------- Current treatment: Herceptin maintenance every 3 weeks No major toxicities to Herceptin therapy.  Anastrozole counseling: We discussed the risks and benefits of anti-estrogen therapy with aromatase inhibitors. These include but not limited to insomnia, hot flashes, mood changes, vaginal dryness, bone density loss, and weight gain. We strongly believe that the benefits far outweigh the risks. Patient understands these risks and  consented to starting treatment. Planned treatment duration is 5-7 years.  I discussed with her about exercises to strengthen her lower extremities. Insomnia: Slightly better  Return to clinic every 3 weeks for Herceptin every 6 weeks of follow-up with me.   No orders of the defined types were placed in this encounter.  The patient has a good understanding of the overall plan. she agrees with it. she will call with any problems that may develop before the next visit here.  Nicholas Lose, MD 01/25/2019  Julious Oka Dorshimer am acting as scribe for Dr. Nicholas Lose.  I have reviewed the above documentation for accuracy and completeness, and I agree with the above.

## 2019-01-25 ENCOUNTER — Inpatient Hospital Stay: Payer: Medicare Other

## 2019-01-25 ENCOUNTER — Other Ambulatory Visit: Payer: Self-pay

## 2019-01-25 ENCOUNTER — Inpatient Hospital Stay (HOSPITAL_BASED_OUTPATIENT_CLINIC_OR_DEPARTMENT_OTHER): Payer: Medicare Other | Admitting: Hematology and Oncology

## 2019-01-25 ENCOUNTER — Encounter: Payer: Self-pay | Admitting: *Deleted

## 2019-01-25 ENCOUNTER — Inpatient Hospital Stay: Payer: Medicare Other | Attending: Hematology and Oncology

## 2019-01-25 DIAGNOSIS — Z23 Encounter for immunization: Secondary | ICD-10-CM | POA: Insufficient documentation

## 2019-01-25 DIAGNOSIS — Z7984 Long term (current) use of oral hypoglycemic drugs: Secondary | ICD-10-CM | POA: Insufficient documentation

## 2019-01-25 DIAGNOSIS — Z170 Estrogen receptor positive status [ER+]: Secondary | ICD-10-CM

## 2019-01-25 DIAGNOSIS — Z791 Long term (current) use of non-steroidal anti-inflammatories (NSAID): Secondary | ICD-10-CM | POA: Insufficient documentation

## 2019-01-25 DIAGNOSIS — Z95828 Presence of other vascular implants and grafts: Secondary | ICD-10-CM

## 2019-01-25 DIAGNOSIS — Z923 Personal history of irradiation: Secondary | ICD-10-CM | POA: Insufficient documentation

## 2019-01-25 DIAGNOSIS — C50411 Malignant neoplasm of upper-outer quadrant of right female breast: Secondary | ICD-10-CM

## 2019-01-25 DIAGNOSIS — Z7901 Long term (current) use of anticoagulants: Secondary | ICD-10-CM | POA: Insufficient documentation

## 2019-01-25 DIAGNOSIS — Z5112 Encounter for antineoplastic immunotherapy: Secondary | ICD-10-CM | POA: Diagnosis present

## 2019-01-25 DIAGNOSIS — Z79899 Other long term (current) drug therapy: Secondary | ICD-10-CM | POA: Diagnosis not present

## 2019-01-25 LAB — CBC WITH DIFFERENTIAL (CANCER CENTER ONLY)
Abs Immature Granulocytes: 0.01 10*3/uL (ref 0.00–0.07)
Basophils Absolute: 0 10*3/uL (ref 0.0–0.1)
Basophils Relative: 1 %
Eosinophils Absolute: 0.1 10*3/uL (ref 0.0–0.5)
Eosinophils Relative: 1 %
HCT: 33.5 % — ABNORMAL LOW (ref 36.0–46.0)
Hemoglobin: 10.2 g/dL — ABNORMAL LOW (ref 12.0–15.0)
Immature Granulocytes: 0 %
Lymphocytes Relative: 17 %
Lymphs Abs: 1 10*3/uL (ref 0.7–4.0)
MCH: 26.6 pg (ref 26.0–34.0)
MCHC: 30.4 g/dL (ref 30.0–36.0)
MCV: 87.2 fL (ref 80.0–100.0)
Monocytes Absolute: 0.6 10*3/uL (ref 0.1–1.0)
Monocytes Relative: 10 %
Neutro Abs: 4.2 10*3/uL (ref 1.7–7.7)
Neutrophils Relative %: 71 %
Platelet Count: 166 10*3/uL (ref 150–400)
RBC: 3.84 MIL/uL — ABNORMAL LOW (ref 3.87–5.11)
RDW: 14.2 % (ref 11.5–15.5)
WBC Count: 5.9 10*3/uL (ref 4.0–10.5)
nRBC: 0 % (ref 0.0–0.2)

## 2019-01-25 LAB — CMP (CANCER CENTER ONLY)
ALT: 6 U/L (ref 0–44)
AST: 13 U/L — ABNORMAL LOW (ref 15–41)
Albumin: 3.5 g/dL (ref 3.5–5.0)
Alkaline Phosphatase: 87 U/L (ref 38–126)
Anion gap: 7 (ref 5–15)
BUN: 13 mg/dL (ref 8–23)
CO2: 29 mmol/L (ref 22–32)
Calcium: 9 mg/dL (ref 8.9–10.3)
Chloride: 105 mmol/L (ref 98–111)
Creatinine: 0.62 mg/dL (ref 0.44–1.00)
GFR, Est AFR Am: >60 mL/min (ref >60)
GFR, Estimated: >60 mL/min (ref >60)
Glucose, Bld: 126 mg/dL — ABNORMAL HIGH (ref 70–99)
Potassium: 4.1 mmol/L (ref 3.5–5.1)
Sodium: 141 mmol/L (ref 135–145)
Total Bilirubin: <0.2 mg/dL — ABNORMAL LOW (ref 0.3–1.2)
Total Protein: 6.3 g/dL — ABNORMAL LOW (ref 6.5–8.1)

## 2019-01-25 MED ORDER — DIPHENHYDRAMINE HCL 25 MG PO CAPS
ORAL_CAPSULE | ORAL | Status: AC
  Filled 2019-01-25: qty 2

## 2019-01-25 MED ORDER — HEPARIN SOD (PORK) LOCK FLUSH 100 UNIT/ML IV SOLN
500.0000 [IU] | Freq: Once | INTRAVENOUS | Status: AC | PRN
  Administered 2019-01-25: 13:00:00 500 [IU]
  Filled 2019-01-25: qty 5

## 2019-01-25 MED ORDER — INFLUENZA VAC A&B SA ADJ QUAD 0.5 ML IM PRSY
PREFILLED_SYRINGE | INTRAMUSCULAR | Status: AC
  Filled 2019-01-25: qty 0.5

## 2019-01-25 MED ORDER — ACETAMINOPHEN 325 MG PO TABS
650.0000 mg | ORAL_TABLET | Freq: Once | ORAL | Status: AC
  Administered 2019-01-25: 12:00:00 650 mg via ORAL

## 2019-01-25 MED ORDER — SODIUM CHLORIDE 0.9% FLUSH
10.0000 mL | INTRAVENOUS | Status: DC | PRN
  Administered 2019-01-25: 13:00:00 10 mL
  Filled 2019-01-25: qty 10

## 2019-01-25 MED ORDER — ACETAMINOPHEN 325 MG PO TABS
ORAL_TABLET | ORAL | Status: AC
  Filled 2019-01-25: qty 2

## 2019-01-25 MED ORDER — TRASTUZUMAB-DKST CHEMO 150 MG IV SOLR
450.0000 mg | Freq: Once | INTRAVENOUS | Status: AC
  Administered 2019-01-25: 13:00:00 450 mg via INTRAVENOUS
  Filled 2019-01-25: qty 21.43

## 2019-01-25 MED ORDER — SODIUM CHLORIDE 0.9% FLUSH
10.0000 mL | Freq: Once | INTRAVENOUS | Status: AC
  Administered 2019-01-25: 10 mL
  Filled 2019-01-25: qty 10

## 2019-01-25 MED ORDER — ANASTROZOLE 1 MG PO TABS: 1.0000 mg | ORAL_TABLET | Freq: Every day | ORAL | 3 refills | Status: DC

## 2019-01-25 MED ORDER — DIPHENHYDRAMINE HCL 25 MG PO CAPS
50.0000 mg | ORAL_CAPSULE | Freq: Once | ORAL | Status: AC
  Administered 2019-01-25: 12:00:00 50 mg via ORAL

## 2019-01-25 MED ORDER — INFLUENZA VAC A&B SA ADJ QUAD 0.5 ML IM PRSY
0.5000 mL | PREFILLED_SYRINGE | Freq: Once | INTRAMUSCULAR | Status: AC
  Administered 2019-01-25: 13:00:00 0.5 mL via INTRAMUSCULAR

## 2019-01-25 MED ORDER — SODIUM CHLORIDE 0.9 % IV SOLN
Freq: Once | INTRAVENOUS | Status: AC
  Administered 2019-01-25: 11:00:00 via INTRAVENOUS
  Filled 2019-01-25: qty 250

## 2019-01-25 NOTE — Assessment & Plan Note (Signed)
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  Treatment plan: 1.Adj chemo with Taxol-Herceptin weekly X 12 completed 11/02/2018 and then q 3 weeks Herceptin. 2.Adjuvant radiation therapy started 12/05/2018-01/02/19 3.Followed by adjuvant antiestrogen therapy ----------------------------------------------------------------------------------------------------------------------------------------------- Current treatment: Herceptin maintenance every 3 weeks No major toxicities to Herceptin therapy.  Anastrozole counseling: We discussed the risks and benefits of anti-estrogen therapy with aromatase inhibitors. These include but not limited to insomnia, hot flashes, mood changes, vaginal dryness, bone density loss, and weight gain. We strongly believe that the benefits far outweigh the risks. Patient understands these risks and consented to starting treatment. Planned treatment duration is 5-7 years.  Return to clinic every 3 weeks for Herceptin every 6 weeks of follow-up with me.   I discussed with her about exercises to strengthen her lower extremities. Insomnia: .

## 2019-01-25 NOTE — Patient Instructions (Signed)
[facility] °Discharge Instructions for Patients Receiving Chemotherapy ° °Today you received the following chemotherapy agents Trastuzumab ° °To help prevent nausea and vomiting after your treatment, we encourage you to take your nausea medication as directed. °  °If you develop nausea and vomiting that is not controlled by your nausea medication, call the clinic.  ° °BELOW ARE SYMPTOMS THAT SHOULD BE REPORTED IMMEDIATELY: °· *FEVER GREATER THAN 100.5 F °· *CHILLS WITH OR WITHOUT FEVER °· NAUSEA AND VOMITING THAT IS NOT CONTROLLED WITH YOUR NAUSEA MEDICATION °· *UNUSUAL SHORTNESS OF BREATH °· *UNUSUAL BRUISING OR BLEEDING °· TENDERNESS IN MOUTH AND THROAT WITH OR WITHOUT PRESENCE OF ULCERS °· *URINARY PROBLEMS °· *BOWEL PROBLEMS °· UNUSUAL RASH °Items with * indicate a potential emergency and should be followed up as soon as possible. ° °Feel free to call the clinic should you have any questions or concerns. The clinic phone number is [phone]. ° °Please show the CHEMO ALERT CARD at check-in to the Emergency Department and triage nurse. ° ° °

## 2019-01-25 NOTE — Patient Instructions (Signed)

## 2019-02-06 ENCOUNTER — Ambulatory Visit (INDEPENDENT_AMBULATORY_CARE_PROVIDER_SITE_OTHER): Payer: Medicare Other | Admitting: Pharmacist

## 2019-02-06 ENCOUNTER — Other Ambulatory Visit: Payer: Self-pay

## 2019-02-06 DIAGNOSIS — Z7901 Long term (current) use of anticoagulants: Secondary | ICD-10-CM | POA: Diagnosis not present

## 2019-02-06 DIAGNOSIS — Z5181 Encounter for therapeutic drug level monitoring: Secondary | ICD-10-CM

## 2019-02-06 DIAGNOSIS — I480 Paroxysmal atrial fibrillation: Secondary | ICD-10-CM

## 2019-02-06 LAB — POCT INR: INR: 2.2 (ref 2.0–3.0)

## 2019-02-06 NOTE — Patient Instructions (Signed)
Description   Continue 10mg  daily except 5mg  on Mondays, Wednesdays and Saturdays. Continue eating 2 servings of leafy green vegetables each week. Recheck INR in 8 weeks.

## 2019-02-08 NOTE — Progress Notes (Signed)
I called the patient today about her upcoming follow-up appointment in radiation oncology.   Given concerns about the COVID-19 pandemic, I offered a phone assessment with the patient to determine if coming to the clinic was necessary. She accepted.  I let the patient know that I had spoken with Dr. Isidore Moos, and she wanted them to know the importance of washing their hands for at least 20 seconds at a time, especially after going out in public, and before they eat.  Limit going out in public whenever possible. Do not touch your face, unless your hands are clean, such as when bathing. Get plenty of rest, eat well, and stay hydrated.   The patient denies any symptomatic concerns.  Specifically, they report good healing of their skin in the radiation fields.  Skin is intact.    I recommended that she continue skin care by applying oil or lotion with vitamin E to the skin in the radiation fields, BID, for 2 more months.  Continue follow-up with medical oncology - follow-up is scheduled on 03/07/19 with Dr. Lindi Adie.  I explained that yearly mammograms are important for patients with intact breast tissue, and physical exams are important after mastectomy for patients that cannot undergo mammography.  I encouraged her to call if she had further questions or concerns about her healing. Otherwise, she will follow-up PRN in radiation oncology. Patient is pleased with this plan, and we will cancel her upcoming follow-up to reduce the risk of COVID-19 transmission.

## 2019-02-09 ENCOUNTER — Ambulatory Visit
Admission: RE | Admit: 2019-02-09 | Discharge: 2019-02-09 | Disposition: A | Discharge status: Home / Self Care | Payer: Medicare Other | Source: Ambulatory Visit | Attending: Radiation Oncology | Admitting: Radiation Oncology

## 2019-02-15 ENCOUNTER — Other Ambulatory Visit: Payer: Self-pay

## 2019-02-15 ENCOUNTER — Inpatient Hospital Stay: Payer: Medicare Other | Attending: Hematology and Oncology

## 2019-02-15 VITALS — BP 136/72 | HR 87 | Temp 98.8°F | Resp 18

## 2019-02-15 DIAGNOSIS — C50411 Malignant neoplasm of upper-outer quadrant of right female breast: Secondary | ICD-10-CM

## 2019-02-15 DIAGNOSIS — Z5112 Encounter for antineoplastic immunotherapy: Secondary | ICD-10-CM | POA: Insufficient documentation

## 2019-02-15 DIAGNOSIS — Z7901 Long term (current) use of anticoagulants: Secondary | ICD-10-CM | POA: Diagnosis not present

## 2019-02-15 DIAGNOSIS — Z79811 Long term (current) use of aromatase inhibitors: Secondary | ICD-10-CM | POA: Diagnosis not present

## 2019-02-15 DIAGNOSIS — Z170 Estrogen receptor positive status [ER+]: Secondary | ICD-10-CM | POA: Insufficient documentation

## 2019-02-15 DIAGNOSIS — Z923 Personal history of irradiation: Secondary | ICD-10-CM | POA: Diagnosis not present

## 2019-02-15 DIAGNOSIS — Z7984 Long term (current) use of oral hypoglycemic drugs: Secondary | ICD-10-CM | POA: Diagnosis not present

## 2019-02-15 DIAGNOSIS — Z79899 Other long term (current) drug therapy: Secondary | ICD-10-CM | POA: Diagnosis not present

## 2019-02-15 MED ORDER — ACETAMINOPHEN 325 MG PO TABS
ORAL_TABLET | ORAL | Status: AC
  Filled 2019-02-15: qty 2

## 2019-02-15 MED ORDER — ACETAMINOPHEN 325 MG PO TABS
650.0000 mg | ORAL_TABLET | Freq: Once | ORAL | Status: AC
  Administered 2019-02-15: 650 mg via ORAL

## 2019-02-15 MED ORDER — TRASTUZUMAB-DKST CHEMO 150 MG IV SOLR
450.0000 mg | Freq: Once | INTRAVENOUS | Status: AC
  Administered 2019-02-15: 450 mg via INTRAVENOUS
  Filled 2019-02-15: qty 21.43

## 2019-02-15 MED ORDER — HEPARIN SOD (PORK) LOCK FLUSH 100 UNIT/ML IV SOLN
500.0000 [IU] | Freq: Once | INTRAVENOUS | Status: AC | PRN
  Administered 2019-02-15: 500 [IU]
  Filled 2019-02-15: qty 5

## 2019-02-15 MED ORDER — SODIUM CHLORIDE 0.9% FLUSH
10.0000 mL | INTRAVENOUS | Status: DC | PRN
  Administered 2019-02-15: 10 mL
  Filled 2019-02-15: qty 10

## 2019-02-15 MED ORDER — DIPHENHYDRAMINE HCL 25 MG PO CAPS
50.0000 mg | ORAL_CAPSULE | Freq: Once | ORAL | Status: AC
  Administered 2019-02-15: 50 mg via ORAL

## 2019-02-15 MED ORDER — SODIUM CHLORIDE 0.9 % IV SOLN
Freq: Once | INTRAVENOUS | Status: AC
  Administered 2019-02-15: 10:00:00 via INTRAVENOUS
  Filled 2019-02-15: qty 250

## 2019-02-15 MED ORDER — DIPHENHYDRAMINE HCL 25 MG PO CAPS
ORAL_CAPSULE | ORAL | Status: AC
  Filled 2019-02-15: qty 2

## 2019-02-15 NOTE — Patient Instructions (Signed)
[facility] Discharge Instructions for Patients Receiving Chemotherapy  Today you received the following chemotherapy agents: Trastuzumab   To help prevent nausea and vomiting after your treatment, we encourage you to take your nausea medication  as prescribed.    If you develop nausea and vomiting that is not controlled by your nausea medication, call the clinic.   BELOW ARE SYMPTOMS THAT SHOULD BE REPORTED IMMEDIATELY:  *FEVER GREATER THAN 100.5 F  *CHILLS WITH OR WITHOUT FEVER  NAUSEA AND VOMITING THAT IS NOT CONTROLLED WITH YOUR NAUSEA MEDICATION  *UNUSUAL SHORTNESS OF BREATH  *UNUSUAL BRUISING OR BLEEDING  TENDERNESS IN MOUTH AND THROAT WITH OR WITHOUT PRESENCE OF ULCERS  *URINARY PROBLEMS  *BOWEL PROBLEMS  UNUSUAL RASH Items with * indicate a potential emergency and should be followed up as soon as possible.  Feel free to call the clinic should you have any questions or concerns. The clinic phone number is [phone].  Please show the CHEMO ALERT CARD at check-in to the Emergency Department and triage nurse.   

## 2019-02-21 ENCOUNTER — Encounter: Payer: Self-pay | Admitting: Cardiology

## 2019-02-21 ENCOUNTER — Other Ambulatory Visit: Payer: Self-pay

## 2019-02-21 ENCOUNTER — Ambulatory Visit (INDEPENDENT_AMBULATORY_CARE_PROVIDER_SITE_OTHER): Payer: Medicare Other | Admitting: Cardiology

## 2019-02-21 VITALS — BP 140/70 | HR 72 | Ht 69.0 in | Wt 179.4 lb

## 2019-02-21 DIAGNOSIS — Z170 Estrogen receptor positive status [ER+]: Secondary | ICD-10-CM

## 2019-02-21 DIAGNOSIS — I10 Essential (primary) hypertension: Secondary | ICD-10-CM | POA: Diagnosis not present

## 2019-02-21 DIAGNOSIS — Z789 Other specified health status: Secondary | ICD-10-CM

## 2019-02-21 DIAGNOSIS — C50411 Malignant neoplasm of upper-outer quadrant of right female breast: Secondary | ICD-10-CM | POA: Diagnosis not present

## 2019-02-21 DIAGNOSIS — F17200 Nicotine dependence, unspecified, uncomplicated: Secondary | ICD-10-CM

## 2019-02-21 DIAGNOSIS — I480 Paroxysmal atrial fibrillation: Secondary | ICD-10-CM

## 2019-02-21 DIAGNOSIS — E785 Hyperlipidemia, unspecified: Secondary | ICD-10-CM

## 2019-02-21 NOTE — Patient Instructions (Signed)
Medication Instructions:  .isntcur  *If you need a refill on your cardiac medications before your next appointment, please call your pharmacy*  Lab Work: None.  If you have labs (blood work) drawn today and your tests are completely normal, you will receive your results only by: Marland Kitchen MyChart Message (if you have MyChart) OR . A paper copy in the mail If you have any lab test that is abnormal or we need to change your treatment, we will call you to review the results.  Testing/Procedures: Your physician has requested that you have an echocardiogram. Echocardiography is a painless test that uses sound waves to create images of your heart. It provides your doctor with information about the size and shape of your heart and how well your heart's chambers and valves are working. This procedure takes approximately one hour. There are no restrictions for this procedure.    Follow-Up: At [facility], you and your health needs are our priority.  As part of our continuing mission to provide you with exceptional heart care, we have created designated Provider Care Teams.  These Care Teams include your primary Cardiologist (physician) and Advanced Practice Providers (APPs -  Physician Assistants and Nurse Practitioners) who all work together to provide you with the care you need, when you need it.  Your next appointment:   12 months  The format for your next appointment:   In Person  Provider:   Jenne Campus, MD  Other Instructions   Echocardiogram An echocardiogram is a procedure that uses painless sound waves (ultrasound) to produce an image of the heart. Images from an echocardiogram can provide important information about:  Signs of coronary artery disease (CAD).  Aneurysm detection. An aneurysm is a weak or damaged part of an artery wall that bulges out from the normal force of blood pumping through the body.  Heart size and shape. Changes in the size or shape of the heart can be  associated with certain conditions, including heart failure, aneurysm, and CAD.  Heart muscle function.  Heart valve function.  Signs of a past heart attack.  Fluid buildup around the heart.  Thickening of the heart muscle.  A tumor or infectious growth around the heart valves. Tell a health care provider about:  Any allergies you have.  All medicines you are taking, including vitamins, herbs, eye drops, creams, and over-the-counter medicines.  Any blood disorders you have.  Any surgeries you have had.  Any medical conditions you have.  Whether you are pregnant or may be pregnant. What are the risks? Generally, this is a safe procedure. However, problems may occur, including:  Allergic reaction to dye (contrast) that may be used during the procedure. What happens before the procedure? No specific preparation is needed. You may eat and drink normally. What happens during the procedure?   An IV tube may be inserted into one of your veins.  You may receive contrast through this tube. A contrast is an injection that improves the quality of the pictures from your heart.  A gel will be applied to your chest.  A wand-like tool (transducer) will be moved over your chest. The gel will help to transmit the sound waves from the transducer.  The sound waves will harmlessly bounce off of your heart to allow the heart images to be captured in real-time motion. The images will be recorded on a computer. The procedure may vary among health care providers and hospitals. What happens after the procedure?  You may return  to your normal, everyday life, including diet, activities, and medicines, unless your health care provider tells you not to do that. Summary  An echocardiogram is a procedure that uses painless sound waves (ultrasound) to produce an image of the heart.  Images from an echocardiogram can provide important information about the size and shape of your heart, heart  muscle function, heart valve function, and fluid buildup around your heart.  You do not need to do anything to prepare before this procedure. You may eat and drink normally.  After the echocardiogram is completed, you may return to your normal, everyday life, unless your health care provider tells you not to do that. This information is not intended to replace advice given to you by your health care provider. Make sure you discuss any questions you have with your health care provider. Document Released: 03/26/2000 Document Revised: 07/20/2018 Document Reviewed: 05/01/2016 Elsevier Patient Education  2020 [employer].

## 2019-02-21 NOTE — Addendum Note (Signed)
Addended by: Ashok Norris on: 02/21/2019 10:16 AM   Modules accepted: Orders

## 2019-02-21 NOTE — Progress Notes (Signed)
Cardiology Office Note:    Date:  02/21/2019   ID:  Sabrina [name], DOB 12/19/1950, MRN [id number]  PCP:  Enid Skeens., MD  Cardiologist:  Jenne Campus, MD    Referring MD: Enid Skeens., MD   Chief Complaint  Patient presents with  . Follow-up  Doing well  History of Present Illness:    Sabrina [name] is a 68 y.o. female with paroxysmal atrial fibrillation, chronic smoking which is still ongoing 2 packs/day, diabetes, essential hypertension, breast cancer.  She comes today to my office for follow-up.  Overall seems to be doing well she is finishing her chemotherapy and happy that this is over she did have a rough right with it however overall now recovering still weak tired and exhausted short of breath quite easily.  [place] this year she was not able to do it because of treatment for cancer.  Denies have any complaint from cardiac standpoint review.  No palpitations.  No dizziness no passing out  Past Medical History:  Diagnosis Date  . Atrial fibrillation ([place]) [I48.91] 11/08/2016  . Benign essential hypertension 08/25/2015  . Diabetes ([place])   . Family history of breast cancer   . Family history of leukemia   . Family history of prostate cancer   . Hyperlipidemia   . Hypothyroidism due to Hashimoto's thyroiditis 05/23/2015    Past Surgical History:  Procedure Laterality Date  . ABDOMINAL HYSTERECTOMY    . APPENDECTOMY    . BREAST LUMPECTOMY WITH RADIOACTIVE SEED AND SENTINEL LYMPH NODE BIOPSY Right 07/13/2018   Procedure: RIGHT BREAST LUMPECTOMY WITH RADIOACTIVE SEED AND RIGHT SENTINEL LYMPH NODE MAPPING;  Surgeon: Erroll Luna, MD;  Location: [place];  Service: General;  Laterality: Right;  . BREAST SURGERY    . CHOLECYSTECTOMY    . KNEE SURGERY    . PORTACATH PLACEMENT Right 08/02/2018   Procedure: INSERTION PORT-A-CATH WITH ULTRASOUND;  Surgeon: Erroll Luna, MD;  Location: [place];  Service: General;  Laterality: Right;    Current Medications: Current Meds  Medication Sig  . anastrozole (ARIMIDEX) 1 MG tablet Take 1 tablet (1 mg total) by mouth daily.  . digoxin (LANOXIN) 0.125 MG tablet Take 1 tablet (125 mcg total) by mouth daily.  . flecainide (TAMBOCOR) 100 MG tablet Take 1 tablet (100 mg total) by mouth 2 (two) times daily.  Marland Kitchen levothyroxine (SYNTHROID, LEVOTHROID) 100 MCG tablet Take 1 tablet by mouth daily.  Marland Kitchen lidocaine-prilocaine (EMLA) cream Apply to affected area once  . lisinopril (PRINIVIL,ZESTRIL) 10 MG tablet Take 1 tablet (10 mg total) by mouth daily.  . metFORMIN (GLUCOPHAGE) 500 MG tablet Take 1 tablet by mouth 2 (two) times daily with a meal.   . metoprolol succinate (TOPROL-XL) 50 MG 24 hr tablet TAKE 1 TABLET DAILY  . warfarin (COUMADIN) 10 MG tablet Take 1/2 to 1 tablet as directed by [facility] Clinic     Allergies:   Atorvastatin and Meperidine   Social History   Socioeconomic History  . Marital status: Widowed    Spouse name: Not on file  . Number of children: Not on file  . Years of education: Not on file  . Highest education level: Not on file  Occupational History  . Not on file  Social Needs  . Financial resource strain: Not on file  . Food insecurity    Worry: Not on file    Inability: Not on file  .  Transportation needs    Medical: No    Non-medical: No  Tobacco Use  . Smoking status: Current Every Day Smoker    Packs/day: 2.00    Years: 40.00    Pack years: 80.00  . Smokeless tobacco: Never Used  Substance and Sexual Activity  . Alcohol use: Never    Frequency: Never  . Drug use: Never  . Sexual activity: Not on file  Lifestyle  . Physical activity    Days per week: Not on file    Minutes per session: Not on file  . Stress: Not on file  Relationships  . Social Herbalist on phone: Not on file    Gets together: Not on file    Attends religious service: Not on file    Active  member of club or organization: Not on file    Attends meetings of clubs or organizations: Not on file    Relationship status: Not on file  Other Topics Concern  . Not on file  Social History Narrative  . Not on file     Family History: The patient's family history includes Acute myelogenous leukemia in her brother; Alcohol abuse in her maternal aunt, maternal uncle, and paternal uncle; Breast cancer in her cousin and maternal aunt; Breast cancer (age of onset: 4) in her cousin; Breast cancer (age of onset: 70) in her mother; Cancer in her brother and maternal uncle; Diabetes in her maternal uncle, paternal uncle, and son; Hypertension in her maternal aunt and maternal grandmother; Prostate cancer in her father. ROS:   Please see the history of present illness.    All 14 point review of systems negative except as described per history of present illness  EKGs/Labs/Other Studies Reviewed:      Recent Labs: 01/25/2019: ALT 6; BUN 13; Creatinine 0.62; Hemoglobin 10.2; Platelet Count 166; Potassium 4.1; Sodium 141  Recent Lipid Panel No results found for: CHOL, TRIG, HDL, CHOLHDL, VLDL, LDLCALC, LDLDIRECT  Physical Exam:    VS:  BP 140/70   Pulse 72   Ht 5\' 9"  (1.753 m)   Wt 179 lb 6.4 oz (81.4 kg)   SpO2 93%   BMI 26.49 kg/m     Wt Readings from Last 3 Encounters:  02/21/19 179 lb 6.4 oz (81.4 kg)  01/25/19 179 lb 9.6 oz (81.5 kg)  01/04/19 177 lb 8 oz (80.5 kg)     GEN:  Well nourished, well developed in no acute distress HEENT: Normal NECK: No JVD; No carotid bruits LYMPHATICS: No lymphadenopathy CARDIAC: RRR, no murmurs, no rubs, no gallops RESPIRATORY:  Clear to auscultation without rales, wheezing or rhonchi  ABDOMEN: Soft, non-tender, non-distended MUSCULOSKELETAL:  No edema; No deformity  SKIN: Warm and dry LOWER EXTREMITIES: no swelling NEUROLOGIC:  Alert and oriented x 3 PSYCHIATRIC:  Normal affect   ASSESSMENT:    1. Paroxysmal atrial fibrillation (HCC)    2. Benign essential hypertension   3. Dyslipidemia   4. Malignant neoplasm of upper-outer quadrant of right breast in female, estrogen receptor positive (HCC)   5. Statin intolerance   6. Smoking    PLAN:    In order of problems listed above:  1. Paroxysmal atrial fibrillation seems to maintaining sinus rhythm on appropriate medication occluding anticoagulation which is Coumadin as per her wishes.  We will continue 2. Benign essential hypertension blood pressure stable continue present management 3. Dyslipidemia will make arrangements for fasting lipid profile 4. Malignant neoplasm of the breast followed by oncology  team. 5. Statin intolerance again we will check fasting lipid profile and then decide what need to be neck step 6. Smoking ongoing 2 packs/day is a problem she understand that she is working on potential quitting.  However she said for she would like to take care of her cancer. 7. I will ask her to have an echocardiogram to assess left ventricle ejection fraction especially in view of the fact that she was taking chemotherapeutic agent and now she is weak and tired.  We will look for potential cardiomyopathy.     Medication Adjustments/Labs and Tests Ordered: Current medicines are reviewed at length with the patient today.  Concerns regarding medicines are outlined above.  No orders of the defined types were placed in this encounter.  Medication changes: No orders of the defined types were placed in this encounter.   Signed, Park Liter, MD, [facility] 02/21/2019 10:09 AM    [place]

## 2019-03-06 NOTE — Progress Notes (Signed)
Patient Care Team: Enid Skeens., MD as PCP - General (Family Medicine) Mauro Kaufmann, RN as Oncology Nurse Navigator Rockwell Germany, RN as Oncology Nurse Navigator Erroll Luna, MD as Consulting Physician (General Surgery) Nicholas Lose, MD as Consulting Physician (Hematology and Oncology) Eppie Gibson, MD as Attending Physician (Radiation Oncology)  DIAGNOSIS:    ICD-10-CM   1. Malignant neoplasm of upper-outer quadrant of right breast in female, estrogen receptor positive ([place])  C50.411    Z17.0     SUMMARY OF ONCOLOGIC HISTORY: Oncology History  Malignant neoplasm of upper-outer quadrant of right breast in female, estrogen receptor positive ([place])  06/19/2018 Initial Diagnosis   Screening mammogram detected right breast mass at 9 o'clock position 3 cm from the nipple, ultrasound revealed 7 x 5 x 5 mm mass, axillary ultrasound normal-appearing lymph nodes, ultrasound biopsy: Grade 1 IDC ER 100%, PR 10%, Ki-67 10%, HER-2 3+ positive, T1b N0 stage Ia   06/28/2018 Cancer Staging   Staging form: Breast, AJCC 8th Edition - Clinical stage from 06/28/2018: Stage IA (cT1b, cN0, cM0, G1, ER+, PR+, HER2+) - Signed by Nicholas Lose, MD on 06/28/2018   07/05/2018 Genetic Testing   Negative genetic testing on the common hereditary cancer panel.  The Hereditary Gene Panel offered by [facility] includes sequencing and/or deletion duplication testing of the following 48 genes: APC, ATM, AXIN2, BARD1, BMPR1A, BRCA1, BRCA2, BRIP1, CDH1, CDK4, CDKN2A (p14ARF), CDKN2A (p16INK4a), CHEK2, CTNNA1, DICER1, EPCAM (Deletion/duplication testing only), GREM1 (promoter region deletion/duplication testing only), KIT, MEN1, MLH1, MSH2, MSH3, MSH6, MUTYH, NBN, NF1, NHTL1, PALB2, PDGFRA, PMS2, POLD1, POLE, PTEN, RAD50, RAD51C, RAD51D, RNF43, SDHB, SDHC, SDHD, SMAD4, SMARCA4. STK11, TP53, TSC1, TSC2, and VHL.  The following genes were evaluated for sequence changes only: SDHA and HOXB13 c.251G>A variant only. The  report date is July 05, 2018.   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    08/17/2018 -  Chemotherapy   The patient had trastuzumab (HERCEPTIN) 300 mg in sodium chloride 0.9 % 250 mL chemo infusion, 315 mg, Intravenous,  Once, 3 of 3 cycles Dose modification: 6 mg/kg (original dose 6 mg/kg, Cycle 3, Reason: Other (see comments), Comment: changing to q3wk dosing) Administration: 300 mg (08/17/2018), 150 mg (09/14/2018), 150 mg (08/31/2018), 150 mg (09/07/2018), 150 mg (09/21/2018), 168 mg (09/28/2018), 168 mg (10/05/2018), 168 mg (10/12/2018), 168 mg (10/19/2018), 168 mg (10/27/2018), 150 mg (08/24/2018), 450 mg (11/02/2018) PACLitaxel (TAXOL) 162 mg in sodium chloride 0.9 % 250 mL chemo infusion (</= 40m/m2), 80 mg/m2 = 162 mg, Intravenous,  Once, 3 of 3 cycles Administration: 162 mg (08/17/2018), 162 mg (08/24/2018), 162 mg (09/14/2018), 162 mg (08/31/2018), 162 mg (09/07/2018), 162 mg (09/21/2018), 162 mg (09/28/2018), 162 mg (10/05/2018), 162 mg (10/12/2018), 162 mg (10/19/2018), 162 mg (10/27/2018), 162 mg (11/02/2018) trastuzumab-dkst (OGIVRI) 450 mg in sodium chloride 0.9 % 250 mL chemo infusion, 450 mg (100 % of original dose 450 mg), Intravenous,  Once, 5 of 13 cycles Dose modification: 450 mg (original dose 450 mg, Cycle 4, Reason: Other (see comments), Comment: Biosimilar Conversion) Administration: 450 mg (11/23/2018), 450 mg (12/14/2018), 450 mg (01/04/2019), 450 mg (01/25/2019), 450 mg (02/15/2019)  for chemotherapy treatment.    12/05/2018 - 01/02/2019 Radiation Therapy   Adjuvant XRT   01/25/2019 -  Anti-estrogen oral therapy   Anastrozole 132mdaily, plan 5-7 years     CHIEF COMPLIANT: Follow-upon Herceptin maintenance  INTERVAL HISTORY: Sabrina [name] is a 68 y.o. with above-mentioned history of right breast cancer who underwent a lumpectomy, adjuvant chemotherapy, and radiation therapy. She is  currently on treatment with Herceptin maintenance and anti-estrogen therapy with anastrozole. She presents to the clinic today for treatment.   REVIEW OF SYSTEMS:   Constitutional: Denies fevers, chills or abnormal weight loss Eyes: Denies blurriness of vision Ears, nose, mouth, throat, and face: Denies mucositis or sore throat Respiratory: Denies cough, dyspnea or wheezes Cardiovascular: Denies palpitation, chest discomfort Gastrointestinal: Denies nausea, heartburn or change in bowel habits Skin: Denies abnormal skin rashes Lymphatics: Denies new lymphadenopathy or easy bruising Neurological: Denies numbness, tingling or new weaknesses Behavioral/Psych: Mood is stable, no new changes  Extremities: No lower extremity edema Breast: denies any pain or lumps or nodules in either breasts All other systems were reviewed with the patient and are negative.  I have reviewed the past medical history, past surgical history, social history and family history with the patient and they are unchanged from previous note.  ALLERGIES:  is allergic to atorvastatin and meperidine.  MEDICATIONS:  Current Outpatient Medications  Medication Sig Dispense Refill   anastrozole (ARIMIDEX) 1 MG tablet Take 1 tablet (1 mg total) by mouth daily. 90 tablet 3   digoxin (LANOXIN) 0.125 MG tablet Take 1 tablet (125 mcg total) by mouth daily. 90 tablet 1   flecainide (TAMBOCOR) 100 MG tablet Take 1 tablet (100 mg total) by mouth 2 (two) times daily. 180 tablet 1   levothyroxine (SYNTHROID, LEVOTHROID) 100 MCG tablet Take 1 tablet by mouth daily.     lidocaine-prilocaine (EMLA) cream Apply to affected area once 30 g 3   lisinopril (PRINIVIL,ZESTRIL) 10 MG tablet Take 1 tablet (10 mg total) by mouth daily. 90 tablet 3   metFORMIN (GLUCOPHAGE) 500 MG tablet Take 1 tablet by mouth 2 (two) times daily with a meal.      metoprolol succinate (TOPROL-XL) 50 MG 24 hr tablet TAKE 1 TABLET DAILY 90 tablet 1   warfarin  (COUMADIN) 10 MG tablet Take 1/2 to 1 tablet as directed by [facility] 90 tablet 3   No current facility-administered medications for this visit.     PHYSICAL EXAMINATION: ECOG PERFORMANCE STATUS: 1 - Symptomatic but completely ambulatory  Vitals:   03/07/19 1024  BP: (!) 170/77  Pulse: 83  Resp: 17  Temp: 97.8 F (36.6 C)  SpO2: 96%   Filed Weights   03/07/19 1024  Weight: 181 lb 12.8 oz (82.5 kg)    GENERAL: alert, no distress and comfortable SKIN: skin color, texture, turgor are normal, no rashes or significant lesions EYES: normal, Conjunctiva are pink and non-injected, sclera clear OROPHARYNX: no exudate, no erythema and lips, buccal mucosa, and tongue normal  NECK: supple, thyroid normal size, non-tender, without nodularity LYMPH: no palpable lymphadenopathy in the cervical, axillary or inguinal LUNGS: clear to auscultation and percussion with normal breathing effort HEART: regular rate & rhythm and no murmurs and no lower extremity edema ABDOMEN: abdomen soft, non-tender and normal bowel sounds MUSCULOSKELETAL: no cyanosis of digits and no clubbing  NEURO: alert & oriented x 3 with fluent speech, no focal motor/sensory deficits EXTREMITIES: No lower extremity edema  LABORATORY DATA:  I have reviewed the data as listed CMP Latest Ref Rng & Units 01/25/2019 12/14/2018 11/02/2018  Glucose 70 - 99 mg/dL 126(H) 160(H) 139(H)  BUN 8 - 23 mg/dL _0 Creatinine 0.44 - 1.00 mg/dL 0.62 0.59 0.60  Sodium 135 -  145 mmol/L 141 140 140  Potassium 3.5 - 5.1 mmol/L 4.1 4.0 4.1  Chloride 98 - 111 mmol/L 105 105 105  CO2 22 - 32 mmol/L _0 Calcium 8.9 - 10.3 mg/dL 9.0 8.5(L) 9.3  Total Protein 6.5 - 8.1 g/dL 6.3(L) 6.3(L) 6.5  Total Bilirubin 0.3 - 1.2 mg/dL <0.2(L) <0.2(L) 0.3  Alkaline Phos 38 - 126 U/L 87 79 68  AST 15 - 41 U/L 13(L) 15 12(L)  ALT 0 - 44 U/L _1 Lab Results  Component Value Date   WBC 6.0 03/07/2019   HGB 10.6 (L) 03/07/2019   HCT  35.4 (L) 03/07/2019   MCV 82.1 03/07/2019   PLT 153 03/07/2019   NEUTROABS 4.1 03/07/2019    ASSESSMENT & PLAN:  Malignant neoplasm of upper-outer quadrant of right breast in female, estrogen receptor positive ([place]) 07/13/2018:Right lumpectomy ([facility]): IDC with DCIS, 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  Treatment plan: 1.Adj chemo with Taxol-Herceptin weekly X 12completed 7/23/2020and then q 3 weeks Herceptin. 2.Adjuvant radiation therapystarted 12/05/2018-01/02/19 3.Followed by adjuvant antiestrogen therapy ----------------------------------------------------------------------------------------------------------------------------------------------- Current treatment: Herceptin maintenance every 3 weeks until 08/01/2019 No major toxicities to Herceptin therapy.  Anastrozole toxicities: Denies any adverse effects to anastrozole  Insomnia: Better Return to clinic every 3 weeks for Herceptin every 6 weeks of follow-up with me.    No orders of the defined types were placed in this encounter.  The patient has a good understanding of the overall plan. she agrees with it. she will call with any problems that may develop before the next visit here.  Nicholas Lose, MD 03/07/2019  Julious Oka Dorshimer, am acting as scribe for Dr. Nicholas Lose.  I have reviewed the above documentation for accuracy and completeness, and I agree with the above.

## 2019-03-07 ENCOUNTER — Inpatient Hospital Stay: Payer: Medicare Other

## 2019-03-07 ENCOUNTER — Inpatient Hospital Stay (HOSPITAL_BASED_OUTPATIENT_CLINIC_OR_DEPARTMENT_OTHER): Payer: Medicare Other | Admitting: Hematology and Oncology

## 2019-03-07 ENCOUNTER — Other Ambulatory Visit: Payer: Self-pay

## 2019-03-07 DIAGNOSIS — C50411 Malignant neoplasm of upper-outer quadrant of right female breast: Secondary | ICD-10-CM | POA: Diagnosis not present

## 2019-03-07 DIAGNOSIS — Z170 Estrogen receptor positive status [ER+]: Secondary | ICD-10-CM | POA: Diagnosis not present

## 2019-03-07 DIAGNOSIS — Z5112 Encounter for antineoplastic immunotherapy: Secondary | ICD-10-CM | POA: Diagnosis not present

## 2019-03-07 DIAGNOSIS — Z95828 Presence of other vascular implants and grafts: Secondary | ICD-10-CM

## 2019-03-07 LAB — CMP (CANCER CENTER ONLY)
ALT: 12 U/L (ref 0–44)
AST: 27 U/L (ref 15–41)
Albumin: 3.8 g/dL (ref 3.5–5.0)
Alkaline Phosphatase: 84 U/L (ref 38–126)
Anion gap: 10 (ref 5–15)
BUN: 15 mg/dL (ref 8–23)
CO2: 28 mmol/L (ref 22–32)
Calcium: 9.1 mg/dL (ref 8.9–10.3)
Chloride: 104 mmol/L (ref 98–111)
Creatinine: 0.65 mg/dL (ref 0.44–1.00)
GFR, Est AFR Am: >60 mL/min (ref >60)
GFR, Estimated: >60 mL/min (ref >60)
Glucose, Bld: 133 mg/dL — ABNORMAL HIGH (ref 70–99)
Potassium: 4.2 mmol/L (ref 3.5–5.1)
Sodium: 142 mmol/L (ref 135–145)
Total Bilirubin: 0.2 mg/dL — ABNORMAL LOW (ref 0.3–1.2)
Total Protein: 6.8 g/dL (ref 6.5–8.1)

## 2019-03-07 LAB — CBC WITH DIFFERENTIAL (CANCER CENTER ONLY)
Abs Immature Granulocytes: 0.02 10*3/uL (ref 0.00–0.07)
Basophils Absolute: 0 10*3/uL (ref 0.0–0.1)
Basophils Relative: 1 %
Eosinophils Absolute: 0.1 10*3/uL (ref 0.0–0.5)
Eosinophils Relative: 1 %
HCT: 35.4 % — ABNORMAL LOW (ref 36.0–46.0)
Hemoglobin: 10.6 g/dL — ABNORMAL LOW (ref 12.0–15.0)
Immature Granulocytes: 0 %
Lymphocytes Relative: 20 %
Lymphs Abs: 1.2 10*3/uL (ref 0.7–4.0)
MCH: 24.6 pg — ABNORMAL LOW (ref 26.0–34.0)
MCHC: 29.9 g/dL — ABNORMAL LOW (ref 30.0–36.0)
MCV: 82.1 fL (ref 80.0–100.0)
Monocytes Absolute: 0.5 10*3/uL (ref 0.1–1.0)
Monocytes Relative: 8 %
Neutro Abs: 4.1 10*3/uL (ref 1.7–7.7)
Neutrophils Relative %: 70 %
Platelet Count: 153 10*3/uL (ref 150–400)
RBC: 4.31 MIL/uL (ref 3.87–5.11)
RDW: 15.7 % — ABNORMAL HIGH (ref 11.5–15.5)
WBC Count: 6 10*3/uL (ref 4.0–10.5)
nRBC: 0 % (ref 0.0–0.2)

## 2019-03-07 MED ORDER — ACETAMINOPHEN 325 MG PO TABS
650.0000 mg | ORAL_TABLET | Freq: Once | ORAL | Status: AC
  Administered 2019-03-07: 650 mg via ORAL

## 2019-03-07 MED ORDER — DIPHENHYDRAMINE HCL 25 MG PO CAPS
50.0000 mg | ORAL_CAPSULE | Freq: Once | ORAL | Status: AC
  Administered 2019-03-07: 50 mg via ORAL

## 2019-03-07 MED ORDER — SODIUM CHLORIDE 0.9 % IV SOLN
Freq: Once | INTRAVENOUS | Status: AC
  Administered 2019-03-07: 11:00:00 via INTRAVENOUS
  Filled 2019-03-07: qty 250

## 2019-03-07 MED ORDER — TRASTUZUMAB-DKST CHEMO 150 MG IV SOLR
450.0000 mg | Freq: Once | INTRAVENOUS | Status: AC
  Administered 2019-03-07: 450 mg via INTRAVENOUS
  Filled 2019-03-07: qty 21.43

## 2019-03-07 MED ORDER — HEPARIN SOD (PORK) LOCK FLUSH 100 UNIT/ML IV SOLN
500.0000 [IU] | Freq: Once | INTRAVENOUS | Status: AC | PRN
  Administered 2019-03-07: 500 [IU]
  Filled 2019-03-07: qty 5

## 2019-03-07 MED ORDER — SODIUM CHLORIDE 0.9% FLUSH
10.0000 mL | Freq: Once | INTRAVENOUS | Status: AC
  Administered 2019-03-07: 10 mL
  Filled 2019-03-07: qty 10

## 2019-03-07 MED ORDER — DIPHENHYDRAMINE HCL 25 MG PO CAPS
ORAL_CAPSULE | ORAL | Status: AC
  Filled 2019-03-07: qty 2

## 2019-03-07 MED ORDER — SODIUM CHLORIDE 0.9% FLUSH
10.0000 mL | INTRAVENOUS | Status: DC | PRN
  Administered 2019-03-07: 10 mL
  Filled 2019-03-07: qty 10

## 2019-03-07 MED ORDER — ACETAMINOPHEN 325 MG PO TABS
ORAL_TABLET | ORAL | Status: AC
  Filled 2019-03-07: qty 2

## 2019-03-07 NOTE — Assessment & Plan Note (Signed)
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  Treatment plan: 1.Adj chemo with Taxol-Herceptin weekly X 12completed 7/23/2020and then q 3 weeks Herceptin. 2.Adjuvant radiation therapystarted 12/05/2018-01/02/19 3.Followed by adjuvant antiestrogen therapy ----------------------------------------------------------------------------------------------------------------------------------------------- Current treatment: Herceptin maintenance every 3 weeks No major toxicities to Herceptin therapy.  Anastrozole toxicities:  Insomnia: Better Return to clinic every 3 weeks for Herceptin every 6 weeks of follow-up with me.

## 2019-03-07 NOTE — Patient Instructions (Signed)
[facility] Discharge Instructions for Patients Receiving Chemotherapy  Today you received the following chemotherapy agents: Trastuzumab   To help prevent nausea and vomiting after your treatment, we encourage you to take your nausea medication  as prescribed.    If you develop nausea and vomiting that is not controlled by your nausea medication, call the clinic.   BELOW ARE SYMPTOMS THAT SHOULD BE REPORTED IMMEDIATELY:  *FEVER GREATER THAN 100.5 F  *CHILLS WITH OR WITHOUT FEVER  NAUSEA AND VOMITING THAT IS NOT CONTROLLED WITH YOUR NAUSEA MEDICATION  *UNUSUAL SHORTNESS OF BREATH  *UNUSUAL BRUISING OR BLEEDING  TENDERNESS IN MOUTH AND THROAT WITH OR WITHOUT PRESENCE OF ULCERS  *URINARY PROBLEMS  *BOWEL PROBLEMS  UNUSUAL RASH Items with * indicate a potential emergency and should be followed up as soon as possible.  Feel free to call the clinic should you have any questions or concerns. The clinic phone number is [phone].  Please show the CHEMO ALERT CARD at check-in to the Emergency Department and triage nurse.   

## 2019-03-09 ENCOUNTER — Telehealth: Payer: Self-pay | Admitting: Hematology and Oncology

## 2019-03-09 NOTE — Telephone Encounter (Signed)
I left a message regarding schedule  

## 2019-03-16 NOTE — Progress Notes (Signed)
Patient Name: Sabrina [name] MRN: [id number] DOB: 1950/10/05 Referring Physician: Nicholas Lose (Profile Not Attached) Date of Service: 01/02/2019 [facility], [place]                                                        End Of Treatment Note  Diagnoses: C50.411-Malignant neoplasm of upper-outer quadrant of right female breast  Cancer Staging: Cancer Staging Malignant neoplasm of upper-outer quadrant of right breast in female, estrogen receptor positive ([place]) Staging form: Breast, AJCC 8th Edition - Clinical stage from 06/28/2018: Stage IA (cT1b, cN0, cM0, G1, ER+, PR+, HER2+) - Signed by Nicholas Lose, MD on 06/28/2018  pT1b, pN0  Intent: Curative  Radiation Treatment Dates: 12/04/2018 through 01/02/2019 Site Technique Total Dose Dose per Fx Completed Fx Beam Energies  Breast: Breast_Rt 3D 40.05/40.05 2.67 15/15 6X, 10X  Breast: Breast_Rt_Bst specialPort 10/10 2 5/5 12E   Narrative: The patient tolerated radiation therapy relatively well.   Plan: The patient will follow-up with radiation oncology in 1 mo, or as needed.  -----------------------------------  Eppie Gibson, MD .

## 2019-03-20 ENCOUNTER — Telehealth: Payer: Self-pay

## 2019-03-20 NOTE — Telephone Encounter (Signed)
lmomed to move appt as the [place] [facility] will be closed 12/22

## 2019-03-29 ENCOUNTER — Other Ambulatory Visit: Payer: Self-pay

## 2019-03-29 ENCOUNTER — Inpatient Hospital Stay: Payer: Medicare Other | Attending: Hematology and Oncology

## 2019-03-29 VITALS — BP 171/78 | HR 72 | Temp 97.8°F | Resp 17 | Wt 183.5 lb

## 2019-03-29 DIAGNOSIS — C50411 Malignant neoplasm of upper-outer quadrant of right female breast: Secondary | ICD-10-CM | POA: Insufficient documentation

## 2019-03-29 DIAGNOSIS — Z5112 Encounter for antineoplastic immunotherapy: Secondary | ICD-10-CM | POA: Diagnosis not present

## 2019-03-29 DIAGNOSIS — Z9221 Personal history of antineoplastic chemotherapy: Secondary | ICD-10-CM | POA: Insufficient documentation

## 2019-03-29 DIAGNOSIS — Z79811 Long term (current) use of aromatase inhibitors: Secondary | ICD-10-CM | POA: Diagnosis not present

## 2019-03-29 DIAGNOSIS — Z170 Estrogen receptor positive status [ER+]: Secondary | ICD-10-CM | POA: Insufficient documentation

## 2019-03-29 DIAGNOSIS — Z923 Personal history of irradiation: Secondary | ICD-10-CM | POA: Insufficient documentation

## 2019-03-29 MED ORDER — ACETAMINOPHEN 325 MG PO TABS
ORAL_TABLET | ORAL | Status: AC
  Filled 2019-03-29: qty 2

## 2019-03-29 MED ORDER — TRASTUZUMAB-DKST CHEMO 150 MG IV SOLR
450.0000 mg | Freq: Once | INTRAVENOUS | Status: AC
  Administered 2019-03-29: 450 mg via INTRAVENOUS
  Filled 2019-03-29: qty 21.43

## 2019-03-29 MED ORDER — HEPARIN SOD (PORK) LOCK FLUSH 100 UNIT/ML IV SOLN
500.0000 [IU] | Freq: Once | INTRAVENOUS | Status: AC | PRN
  Administered 2019-03-29: 500 [IU]
  Filled 2019-03-29: qty 5

## 2019-03-29 MED ORDER — SODIUM CHLORIDE 0.9 % IV SOLN
Freq: Once | INTRAVENOUS | Status: AC
  Filled 2019-03-29: qty 250

## 2019-03-29 MED ORDER — DIPHENHYDRAMINE HCL 25 MG PO CAPS
50.0000 mg | ORAL_CAPSULE | Freq: Once | ORAL | Status: AC
  Administered 2019-03-29: 50 mg via ORAL

## 2019-03-29 MED ORDER — SODIUM CHLORIDE 0.9% FLUSH
10.0000 mL | INTRAVENOUS | Status: DC | PRN
  Administered 2019-03-29: 10 mL
  Filled 2019-03-29: qty 10

## 2019-03-29 MED ORDER — ACETAMINOPHEN 325 MG PO TABS
650.0000 mg | ORAL_TABLET | Freq: Once | ORAL | Status: AC
  Administered 2019-03-29: 650 mg via ORAL

## 2019-03-29 MED ORDER — DIPHENHYDRAMINE HCL 25 MG PO CAPS
ORAL_CAPSULE | ORAL | Status: AC
  Filled 2019-03-29: qty 2

## 2019-03-29 NOTE — Progress Notes (Signed)
Okay to treat with 8/3 echo per Dr. Lindi Adie echo is every 6 months.

## 2019-04-09 ENCOUNTER — Encounter: Payer: Self-pay | Admitting: *Deleted

## 2019-04-10 ENCOUNTER — Other Ambulatory Visit: Payer: Self-pay

## 2019-04-10 ENCOUNTER — Ambulatory Visit (INDEPENDENT_AMBULATORY_CARE_PROVIDER_SITE_OTHER): Payer: Medicare Other | Admitting: *Deleted

## 2019-04-10 DIAGNOSIS — Z5181 Encounter for therapeutic drug level monitoring: Secondary | ICD-10-CM

## 2019-04-10 DIAGNOSIS — Z7901 Long term (current) use of anticoagulants: Secondary | ICD-10-CM

## 2019-04-10 DIAGNOSIS — I480 Paroxysmal atrial fibrillation: Secondary | ICD-10-CM | POA: Diagnosis not present

## 2019-04-10 LAB — POCT INR: INR: 2.9 (ref 2.0–3.0)

## 2019-04-10 NOTE — Patient Instructions (Signed)
Continue 10mg daily except 5mg on Mondays, Wednesdays and Saturdays. Continue eating 2 servings of leafy green vegetables each week. Recheck INR in 8 weeks.   

## 2019-04-15 ENCOUNTER — Other Ambulatory Visit: Payer: Self-pay | Admitting: Cardiology

## 2019-04-17 ENCOUNTER — Other Ambulatory Visit: Payer: Self-pay

## 2019-04-17 ENCOUNTER — Ambulatory Visit (INDEPENDENT_AMBULATORY_CARE_PROVIDER_SITE_OTHER): Payer: Medicare Other

## 2019-04-17 DIAGNOSIS — I480 Paroxysmal atrial fibrillation: Secondary | ICD-10-CM | POA: Diagnosis not present

## 2019-04-17 DIAGNOSIS — I10 Essential (primary) hypertension: Secondary | ICD-10-CM | POA: Diagnosis not present

## 2019-04-17 NOTE — Progress Notes (Signed)
Complete echocardiogram has been performed.  Jimmy Holland Kotter RDCS, RVT 

## 2019-04-17 NOTE — Progress Notes (Signed)
Patient Care Team: Enid Skeens., MD as PCP - General (Family Medicine) Mauro Kaufmann, RN as Oncology Nurse Navigator Rockwell Germany, RN as Oncology Nurse Navigator Erroll Luna, MD as Consulting Physician (General Surgery) Nicholas Lose, MD as Consulting Physician (Hematology and Oncology) Eppie Gibson, MD as Attending Physician (Radiation Oncology)  DIAGNOSIS:    ICD-10-CM   1. Malignant neoplasm of upper-outer quadrant of right breast in female, estrogen receptor positive ([place])  C50.411    Z17.0     SUMMARY OF ONCOLOGIC HISTORY: Oncology History  Malignant neoplasm of upper-outer quadrant of right breast in female, estrogen receptor positive ([place])  06/19/2018 Initial Diagnosis   Screening mammogram detected right breast mass at 9 o'clock position 3 cm from the nipple, ultrasound revealed 7 x 5 x 5 mm mass, axillary ultrasound normal-appearing lymph nodes, ultrasound biopsy: Grade 1 IDC ER 100%, PR 10%, Ki-67 10%, HER-2 3+ positive, T1b N0 stage Ia   06/28/2018 Cancer Staging   Staging form: Breast, AJCC 8th Edition - Clinical stage from 06/28/2018: Stage IA (cT1b, cN0, cM0, G1, ER+, PR+, HER2+) - Signed by Nicholas Lose, MD on 06/28/2018   07/05/2018 Genetic Testing   Negative genetic testing on the common hereditary cancer panel.  The Hereditary Gene Panel offered by [facility] includes sequencing and/or deletion duplication testing of the following 48 genes: APC, ATM, AXIN2, BARD1, BMPR1A, BRCA1, BRCA2, BRIP1, CDH1, CDK4, CDKN2A (p14ARF), CDKN2A (p16INK4a), CHEK2, CTNNA1, DICER1, EPCAM (Deletion/duplication testing only), GREM1 (promoter region deletion/duplication testing only), KIT, MEN1, MLH1, MSH2, MSH3, MSH6, MUTYH, NBN, NF1, NHTL1, PALB2, PDGFRA, PMS2, POLD1, POLE, PTEN, RAD50, RAD51C, RAD51D, RNF43, SDHB, SDHC, SDHD, SMAD4, SMARCA4. STK11, TP53, TSC1, TSC2, and VHL.  The following genes were evaluated for sequence changes only: SDHA and HOXB13 c.251G>A variant only. The  report date is July 05, 2018.   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    08/17/2018 -  Chemotherapy   The patient had trastuzumab (HERCEPTIN) 300 mg in sodium chloride 0.9 % 250 mL chemo infusion, 315 mg, Intravenous,  Once, 3 of 3 cycles Dose modification: 6 mg/kg (original dose 6 mg/kg, Cycle 3, Reason: Other (see comments), Comment: changing to q3wk dosing) Administration: 300 mg (08/17/2018), 150 mg (09/14/2018), 150 mg (08/31/2018), 150 mg (09/07/2018), 150 mg (09/21/2018), 168 mg (09/28/2018), 168 mg (10/05/2018), 168 mg (10/12/2018), 168 mg (10/19/2018), 168 mg (10/27/2018), 150 mg (08/24/2018), 450 mg (11/02/2018) PACLitaxel (TAXOL) 162 mg in sodium chloride 0.9 % 250 mL chemo infusion (</= 70m/m2), 80 mg/m2 = 162 mg, Intravenous,  Once, 3 of 3 cycles Administration: 162 mg (08/17/2018), 162 mg (08/24/2018), 162 mg (09/14/2018), 162 mg (08/31/2018), 162 mg (09/07/2018), 162 mg (09/21/2018), 162 mg (09/28/2018), 162 mg (10/05/2018), 162 mg (10/12/2018), 162 mg (10/19/2018), 162 mg (10/27/2018), 162 mg (11/02/2018) trastuzumab-dkst (OGIVRI) 450 mg in sodium chloride 0.9 % 250 mL chemo infusion, 450 mg (100 % of original dose 450 mg), Intravenous,  Once, 7 of 13 cycles Dose modification: 450 mg (original dose 450 mg, Cycle 4, Reason: Other (see comments), Comment: Biosimilar Conversion) Administration: 450 mg (11/23/2018), 450 mg (12/14/2018), 450 mg (01/04/2019), 450 mg (01/25/2019), 450 mg (02/15/2019), 450 mg (03/07/2019), 450 mg (03/29/2019)  for chemotherapy treatment.    12/05/2018 - 01/02/2019 Radiation Therapy   Adjuvant XRT   01/25/2019 -  Anti-estrogen oral therapy   Anastrozole 136mdaily, plan 5-7 years  CHIEF COMPLIANT: Follow-upon Herceptin maintenance   INTERVAL HISTORY: Sabrina [name] is a 69 y.o. with above-mentioned history of right breast cancer who underwent a lumpectomy, adjuvant  chemotherapy, andradiationtherapy. She is currently on treatment with Herceptin maintenance and anti-estrogen therapy with anastrozole. Echo on 04/17/19 showed an ejection fraction of 60-65%. She presents to the clinic today for treatment.   She is tolerating Herceptin extremely well.  She is occasional loose stools but not bothering her.  She does not have any problems tolerating anastrozole either.  She denies any hot flashes or myalgias.  She needs to be set up for her mammograms.  ALLERGIES:  is allergic to atorvastatin and meperidine.  MEDICATIONS:  Current Outpatient Medications  Medication Sig Dispense Refill  . anastrozole (ARIMIDEX) 1 MG tablet Take 1 tablet (1 mg total) by mouth daily. 90 tablet 3  . digoxin (LANOXIN) 0.125 MG tablet Take 1 tablet (125 mcg total) by mouth daily. 90 tablet 1  . flecainide (TAMBOCOR) 100 MG tablet Take 1 tablet (100 mg total) by mouth 2 (two) times daily. 180 tablet 1  . levothyroxine (SYNTHROID, LEVOTHROID) 100 MCG tablet Take 1 tablet by mouth daily.    Marland Kitchen lidocaine-prilocaine (EMLA) cream Apply to affected area once 30 g 3  . lisinopril (PRINIVIL,ZESTRIL) 10 MG tablet Take 1 tablet (10 mg total) by mouth daily. 90 tablet 3  . metFORMIN (GLUCOPHAGE) 500 MG tablet Take 1 tablet by mouth 2 (two) times daily with a meal.     . metoprolol succinate (TOPROL-XL) 50 MG 24 hr tablet TAKE 1 TABLET DAILY 90 tablet 3  . warfarin (COUMADIN) 10 MG tablet Take 1/2 to 1 tablet as directed by Coumadin Clinic 90 tablet 3   No current facility-administered medications for this visit.    PHYSICAL EXAMINATION: ECOG PERFORMANCE STATUS: 1 - Symptomatic but completely ambulatory  Vitals:   04/18/19 1441  BP: (!) 167/79  Pulse: 88  Resp: 18  Temp: (!) 97.5 F (36.4 C)  SpO2: 96%   Filed Weights   04/18/19 1441  Weight: 174 lb 14.4 oz (79.3 kg)    LABORATORY DATA:  I have reviewed the data as listed CMP Latest Ref Rng & Units 03/07/2019 01/25/2019 12/14/2018   Glucose 70 - 99 mg/dL 133(H) 126(H) 160(H)  BUN 8 - 23 mg/dL 15 13 13   Creatinine 0.44 - 1.00 mg/dL 0.65 0.62 0.59  Sodium 135 - 145 mmol/L 142 141 140  Potassium 3.5 - 5.1 mmol/L 4.2 4.1 4.0  Chloride 98 - 111 mmol/L 104 105 105  CO2 22 - 32 mmol/L 28 29 29   Calcium 8.9 - 10.3 mg/dL 9.1 9.0 8.5(L)  Total Protein 6.5 - 8.1 g/dL 6.8 6.3(L) 6.3(L)  Total Bilirubin 0.3 - 1.2 mg/dL 0.2(L) <0.2(L) <0.2(L)  Alkaline Phos 38 - 126 U/L 84 87 79  AST 15 - 41 U/L 27 13(L) 15  ALT 0 - 44 U/L 12 6 11     Lab Results  Component Value Date   WBC 8.1 04/18/2019   HGB 10.5 (L) 04/18/2019   HCT 34.6 (L) 04/18/2019   MCV 80.7 04/18/2019   PLT 199 04/18/2019   NEUTROABS 5.4 04/18/2019    ASSESSMENT & PLAN:  Malignant neoplasm of upper-outer quadrant of right breast in female, estrogen receptor positive ([place]) 07/13/2018:Right lumpectomy ([facility]): IDC with DCIS, 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  Treatment plan: 1.Adj chemo with Taxol-Herceptin weekly X 12completed 7/23/2020and then q 3 weeks  Herceptin. 2.Adjuvant radiation therapystarted 12/05/2018-01/02/19 3.Followed by adjuvant antiestrogen therapy ----------------------------------------------------------------------------------------------------------------------------------------------- Current treatment: Herceptin maintenance every 3 weeks until 08/01/2019 Monitoring closely for toxicities  Anastrozole toxicities:  Occasional hot flashes and occasional muscle aches and pains but otherwise tolerating it well. Echocardiogram showed an EF of 60 to 65%: This is a we are monitoring her for cardiac toxicities. Generalized fatigue: Stable Insomnia: Continues to be a challenge. Breast cancer surveillance: Mammogram need to be scheduled first week of April 2021.  Return to clinic every 3 weeks for Herceptin every 6 weeks of follow-up with me    No orders of the defined types were  placed in this encounter.  The patient has a good understanding of the overall plan. she agrees with it. she will call with any problems that may develop before the next visit here.  Total time spent: 30 mins including face to face time and time spent for planning, charting and coordination of care  Nicholas Lose, MD 04/18/2019  I, Cloyde Reams Dorshimer, am acting as scribe for Dr. Nicholas Lose.  I have reviewed the above documentation for accuracy and completeness, and I agree with the above.

## 2019-04-18 ENCOUNTER — Inpatient Hospital Stay (HOSPITAL_BASED_OUTPATIENT_CLINIC_OR_DEPARTMENT_OTHER): Payer: Medicare Other | Admitting: Hematology and Oncology

## 2019-04-18 ENCOUNTER — Inpatient Hospital Stay: Payer: Medicare Other

## 2019-04-18 ENCOUNTER — Inpatient Hospital Stay: Payer: Medicare Other | Attending: Hematology and Oncology

## 2019-04-18 DIAGNOSIS — Z5112 Encounter for antineoplastic immunotherapy: Secondary | ICD-10-CM | POA: Insufficient documentation

## 2019-04-18 DIAGNOSIS — Z79899 Other long term (current) drug therapy: Secondary | ICD-10-CM | POA: Diagnosis not present

## 2019-04-18 DIAGNOSIS — Z79811 Long term (current) use of aromatase inhibitors: Secondary | ICD-10-CM | POA: Diagnosis not present

## 2019-04-18 DIAGNOSIS — Z95828 Presence of other vascular implants and grafts: Secondary | ICD-10-CM

## 2019-04-18 DIAGNOSIS — Z923 Personal history of irradiation: Secondary | ICD-10-CM | POA: Diagnosis not present

## 2019-04-18 DIAGNOSIS — Z9221 Personal history of antineoplastic chemotherapy: Secondary | ICD-10-CM | POA: Diagnosis not present

## 2019-04-18 DIAGNOSIS — C50411 Malignant neoplasm of upper-outer quadrant of right female breast: Secondary | ICD-10-CM | POA: Diagnosis present

## 2019-04-18 DIAGNOSIS — Z170 Estrogen receptor positive status [ER+]: Secondary | ICD-10-CM | POA: Diagnosis not present

## 2019-04-18 DIAGNOSIS — Z7984 Long term (current) use of oral hypoglycemic drugs: Secondary | ICD-10-CM | POA: Insufficient documentation

## 2019-04-18 DIAGNOSIS — Z7901 Long term (current) use of anticoagulants: Secondary | ICD-10-CM | POA: Diagnosis not present

## 2019-04-18 LAB — CBC WITH DIFFERENTIAL (CANCER CENTER ONLY)
Abs Immature Granulocytes: 0.03 10*3/uL (ref 0.00–0.07)
Basophils Absolute: 0.1 10*3/uL (ref 0.0–0.1)
Basophils Relative: 1 %
Eosinophils Absolute: 0.4 10*3/uL (ref 0.0–0.5)
Eosinophils Relative: 5 %
HCT: 34.6 % — ABNORMAL LOW (ref 36.0–46.0)
Hemoglobin: 10.5 g/dL — ABNORMAL LOW (ref 12.0–15.0)
Immature Granulocytes: 0 %
Lymphocytes Relative: 19 %
Lymphs Abs: 1.6 10*3/uL (ref 0.7–4.0)
MCH: 24.5 pg — ABNORMAL LOW (ref 26.0–34.0)
MCHC: 30.3 g/dL (ref 30.0–36.0)
MCV: 80.7 fL (ref 80.0–100.0)
Monocytes Absolute: 0.7 10*3/uL (ref 0.1–1.0)
Monocytes Relative: 8 %
Neutro Abs: 5.4 10*3/uL (ref 1.7–7.7)
Neutrophils Relative %: 67 %
Platelet Count: 199 10*3/uL (ref 150–400)
RBC: 4.29 MIL/uL (ref 3.87–5.11)
RDW: 17.7 % — ABNORMAL HIGH (ref 11.5–15.5)
WBC Count: 8.1 10*3/uL (ref 4.0–10.5)
nRBC: 0 % (ref 0.0–0.2)

## 2019-04-18 LAB — CMP (CANCER CENTER ONLY)
ALT: 10 U/L (ref 0–44)
AST: 16 U/L (ref 15–41)
Albumin: 3.8 g/dL (ref 3.5–5.0)
Alkaline Phosphatase: 96 U/L (ref 38–126)
Anion gap: 8 (ref 5–15)
BUN: 12 mg/dL (ref 8–23)
CO2: 29 mmol/L (ref 22–32)
Calcium: 8.9 mg/dL (ref 8.9–10.3)
Chloride: 99 mmol/L (ref 98–111)
Creatinine: 0.69 mg/dL (ref 0.44–1.00)
GFR, Est AFR Am: >60 mL/min
GFR, Estimated: >60 mL/min
Glucose, Bld: 135 mg/dL — ABNORMAL HIGH (ref 70–99)
Potassium: 4 mmol/L (ref 3.5–5.1)
Sodium: 136 mmol/L (ref 135–145)
Total Bilirubin: 0.3 mg/dL (ref 0.3–1.2)
Total Protein: 7.1 g/dL (ref 6.5–8.1)

## 2019-04-18 MED ORDER — TRASTUZUMAB-DKST CHEMO 150 MG IV SOLR
450.0000 mg | Freq: Once | INTRAVENOUS | Status: AC
  Administered 2019-04-18: 450 mg via INTRAVENOUS
  Filled 2019-04-18: qty 21.43

## 2019-04-18 MED ORDER — SODIUM CHLORIDE 0.9% FLUSH
10.0000 mL | INTRAVENOUS | Status: DC | PRN
  Administered 2019-04-18: 17:00:00 10 mL
  Filled 2019-04-18: qty 10

## 2019-04-18 MED ORDER — DIPHENHYDRAMINE HCL 25 MG PO CAPS
50.0000 mg | ORAL_CAPSULE | Freq: Once | ORAL | Status: AC
  Administered 2019-04-18: 15:00:00 50 mg via ORAL

## 2019-04-18 MED ORDER — ACETAMINOPHEN 325 MG PO TABS
650.0000 mg | ORAL_TABLET | Freq: Once | ORAL | Status: AC
  Administered 2019-04-18: 650 mg via ORAL

## 2019-04-18 MED ORDER — HEPARIN SOD (PORK) LOCK FLUSH 100 UNIT/ML IV SOLN
500.0000 [IU] | Freq: Once | INTRAVENOUS | Status: AC | PRN
  Administered 2019-04-18: 17:00:00 500 [IU]
  Filled 2019-04-18: qty 5

## 2019-04-18 MED ORDER — ACETAMINOPHEN 325 MG PO TABS
ORAL_TABLET | ORAL | Status: AC
  Filled 2019-04-18: qty 2

## 2019-04-18 MED ORDER — SODIUM CHLORIDE 0.9 % IV SOLN
Freq: Once | INTRAVENOUS | Status: AC
  Filled 2019-04-18: qty 250

## 2019-04-18 MED ORDER — DIPHENHYDRAMINE HCL 25 MG PO CAPS
ORAL_CAPSULE | ORAL | Status: AC
  Filled 2019-04-18: qty 2

## 2019-04-18 MED ORDER — SODIUM CHLORIDE 0.9% FLUSH
10.0000 mL | Freq: Once | INTRAVENOUS | Status: AC
  Administered 2019-04-18: 10 mL
  Filled 2019-04-18: qty 10

## 2019-04-18 NOTE — Assessment & Plan Note (Signed)
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  Treatment plan: 1.Adj chemo with Taxol-Herceptin weekly X 12completed 7/23/2020and then q 3 weeks Herceptin. 2.Adjuvant radiation therapystarted 12/05/2018-01/02/19 3.Followed by adjuvant antiestrogen therapy ----------------------------------------------------------------------------------------------------------------------------------------------- Current treatment: Herceptin maintenance every 3 weeks until 08/01/2019 Monitoring closely for toxicities  Anastrozole toxicities:  Occasional hot flashes and occasional muscle aches and pains but otherwise tolerating it well.  Insomnia: Continues to be a challenge. Breast cancer surveillance: Mammogram need to be scheduled first week of April 2021. Return to clinic every 3 weeks for Herceptin every 6 weeks of follow-up with me

## 2019-04-18 NOTE — Patient Instructions (Signed)
[facility] °Discharge Instructions for Patients Receiving Chemotherapy ° °Today you received the following chemotherapy agents Trastuzumab ° °To help prevent nausea and vomiting after your treatment, we encourage you to take your nausea medication as directed. °  °If you develop nausea and vomiting that is not controlled by your nausea medication, call the clinic.  ° °BELOW ARE SYMPTOMS THAT SHOULD BE REPORTED IMMEDIATELY: °· *FEVER GREATER THAN 100.5 F °· *CHILLS WITH OR WITHOUT FEVER °· NAUSEA AND VOMITING THAT IS NOT CONTROLLED WITH YOUR NAUSEA MEDICATION °· *UNUSUAL SHORTNESS OF BREATH °· *UNUSUAL BRUISING OR BLEEDING °· TENDERNESS IN MOUTH AND THROAT WITH OR WITHOUT PRESENCE OF ULCERS °· *URINARY PROBLEMS °· *BOWEL PROBLEMS °· UNUSUAL RASH °Items with * indicate a potential emergency and should be followed up as soon as possible. ° °Feel free to call the clinic should you have any questions or concerns. The clinic phone number is [phone]. ° °Please show the CHEMO ALERT CARD at check-in to the Emergency Department and triage nurse. ° ° °

## 2019-05-09 ENCOUNTER — Inpatient Hospital Stay: Payer: Medicare Other

## 2019-05-09 ENCOUNTER — Other Ambulatory Visit: Payer: Self-pay

## 2019-05-09 VITALS — BP 159/75 | HR 69 | Temp 98.2°F | Resp 18

## 2019-05-09 DIAGNOSIS — Z5112 Encounter for antineoplastic immunotherapy: Secondary | ICD-10-CM | POA: Diagnosis not present

## 2019-05-09 DIAGNOSIS — C50411 Malignant neoplasm of upper-outer quadrant of right female breast: Secondary | ICD-10-CM

## 2019-05-09 DIAGNOSIS — Z170 Estrogen receptor positive status [ER+]: Secondary | ICD-10-CM

## 2019-05-09 MED ORDER — SODIUM CHLORIDE 0.9% FLUSH
10.0000 mL | INTRAVENOUS | Status: DC | PRN
  Administered 2019-05-09: 10 mL
  Filled 2019-05-09: qty 10

## 2019-05-09 MED ORDER — DIPHENHYDRAMINE HCL 25 MG PO CAPS
ORAL_CAPSULE | ORAL | Status: AC
  Filled 2019-05-09: qty 2

## 2019-05-09 MED ORDER — SODIUM CHLORIDE 0.9 % IV SOLN
Freq: Once | INTRAVENOUS | Status: AC
  Filled 2019-05-09: qty 250

## 2019-05-09 MED ORDER — ACETAMINOPHEN 325 MG PO TABS
650.0000 mg | ORAL_TABLET | Freq: Once | ORAL | Status: AC
  Administered 2019-05-09: 650 mg via ORAL

## 2019-05-09 MED ORDER — ACETAMINOPHEN 325 MG PO TABS
ORAL_TABLET | ORAL | Status: AC
  Filled 2019-05-09: qty 2

## 2019-05-09 MED ORDER — HEPARIN SOD (PORK) LOCK FLUSH 100 UNIT/ML IV SOLN
500.0000 [IU] | Freq: Once | INTRAVENOUS | Status: AC | PRN
  Administered 2019-05-09: 500 [IU]
  Filled 2019-05-09: qty 5

## 2019-05-09 MED ORDER — DIPHENHYDRAMINE HCL 25 MG PO CAPS
50.0000 mg | ORAL_CAPSULE | Freq: Once | ORAL | Status: AC
  Administered 2019-05-09: 50 mg via ORAL

## 2019-05-09 MED ORDER — TRASTUZUMAB-DKST CHEMO 150 MG IV SOLR
450.0000 mg | Freq: Once | INTRAVENOUS | Status: AC
  Administered 2019-05-09: 450 mg via INTRAVENOUS
  Filled 2019-05-09: qty 21.43

## 2019-05-09 NOTE — Patient Instructions (Signed)
[facility] °Discharge Instructions for Patients Receiving Chemotherapy ° °Today you received the following chemotherapy agents Trastuzumab ° °To help prevent nausea and vomiting after your treatment, we encourage you to take your nausea medication as directed. °  °If you develop nausea and vomiting that is not controlled by your nausea medication, call the clinic.  ° °BELOW ARE SYMPTOMS THAT SHOULD BE REPORTED IMMEDIATELY: °· *FEVER GREATER THAN 100.5 F °· *CHILLS WITH OR WITHOUT FEVER °· NAUSEA AND VOMITING THAT IS NOT CONTROLLED WITH YOUR NAUSEA MEDICATION °· *UNUSUAL SHORTNESS OF BREATH °· *UNUSUAL BRUISING OR BLEEDING °· TENDERNESS IN MOUTH AND THROAT WITH OR WITHOUT PRESENCE OF ULCERS °· *URINARY PROBLEMS °· *BOWEL PROBLEMS °· UNUSUAL RASH °Items with * indicate a potential emergency and should be followed up as soon as possible. ° °Feel free to call the clinic should you have any questions or concerns. The clinic phone number is [phone]. ° °Please show the CHEMO ALERT CARD at check-in to the Emergency Department and triage nurse. ° ° °

## 2019-05-29 ENCOUNTER — Encounter: Payer: Self-pay | Admitting: *Deleted

## 2019-05-29 ENCOUNTER — Other Ambulatory Visit: Payer: Self-pay

## 2019-05-29 ENCOUNTER — Ambulatory Visit (INDEPENDENT_AMBULATORY_CARE_PROVIDER_SITE_OTHER): Payer: Medicare Other | Admitting: *Deleted

## 2019-05-29 DIAGNOSIS — Z5181 Encounter for therapeutic drug level monitoring: Secondary | ICD-10-CM

## 2019-05-29 DIAGNOSIS — Z7901 Long term (current) use of anticoagulants: Secondary | ICD-10-CM

## 2019-05-29 DIAGNOSIS — I480 Paroxysmal atrial fibrillation: Secondary | ICD-10-CM

## 2019-05-29 LAB — POCT INR: INR: 2.4 (ref 2.0–3.0)

## 2019-05-29 NOTE — Progress Notes (Signed)
Patient Care Team: Enid Skeens., MD as PCP - General (Family Medicine) Mauro Kaufmann, RN as Oncology Nurse Navigator Rockwell Germany, RN as Oncology Nurse Navigator Erroll Luna, MD as Consulting Physician (General Surgery) Nicholas Lose, MD as Consulting Physician (Hematology and Oncology) Eppie Gibson, MD as Attending Physician (Radiation Oncology)  DIAGNOSIS:    ICD-10-CM   1. Malignant neoplasm of upper-outer quadrant of right breast in female, estrogen receptor positive ([place])  C50.411    Z17.0     SUMMARY OF ONCOLOGIC HISTORY: Oncology History  Malignant neoplasm of upper-outer quadrant of right breast in female, estrogen receptor positive ([place])  06/19/2018 Initial Diagnosis   Screening mammogram detected right breast mass at 9 o'clock position 3 cm from the nipple, ultrasound revealed 7 x 5 x 5 mm mass, axillary ultrasound normal-appearing lymph nodes, ultrasound biopsy: Grade 1 IDC ER 100%, PR 10%, Ki-67 10%, HER-2 3+ positive, T1b N0 stage Ia   06/28/2018 Cancer Staging   Staging form: Breast, AJCC 8th Edition - Clinical stage from 06/28/2018: Stage IA (cT1b, cN0, cM0, G1, ER+, PR+, HER2+) - Signed by Nicholas Lose, MD on 06/28/2018   07/05/2018 Genetic Testing   Negative genetic testing on the common hereditary cancer panel.  The Hereditary Gene Panel offered by [facility] includes sequencing and/or deletion duplication testing of the following 48 genes: APC, ATM, AXIN2, BARD1, BMPR1A, BRCA1, BRCA2, BRIP1, CDH1, CDK4, CDKN2A (p14ARF), CDKN2A (p16INK4a), CHEK2, CTNNA1, DICER1, EPCAM (Deletion/duplication testing only), GREM1 (promoter region deletion/duplication testing only), KIT, MEN1, MLH1, MSH2, MSH3, MSH6, MUTYH, NBN, NF1, NHTL1, PALB2, PDGFRA, PMS2, POLD1, POLE, PTEN, RAD50, RAD51C, RAD51D, RNF43, SDHB, SDHC, SDHD, SMAD4, SMARCA4. STK11, TP53, TSC1, TSC2, and VHL.  The following genes were evaluated for sequence changes only: SDHA and HOXB13 c.251G>A variant only. The  report date is July 05, 2018.   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    08/17/2018 -  Chemotherapy   The patient had trastuzumab (HERCEPTIN) 300 mg in sodium chloride 0.9 % 250 mL chemo infusion, 315 mg, Intravenous,  Once, 3 of 3 cycles Dose modification: 6 mg/kg (original dose 6 mg/kg, Cycle 3, Reason: Other (see comments), Comment: changing to q3wk dosing) Administration: 300 mg (08/17/2018), 150 mg (09/14/2018), 150 mg (08/31/2018), 150 mg (09/07/2018), 150 mg (09/21/2018), 168 mg (09/28/2018), 168 mg (10/05/2018), 168 mg (10/12/2018), 168 mg (10/19/2018), 168 mg (10/27/2018), 150 mg (08/24/2018), 450 mg (11/02/2018) PACLitaxel (TAXOL) 162 mg in sodium chloride 0.9 % 250 mL chemo infusion (</= 25m/m2), 80 mg/m2 = 162 mg, Intravenous,  Once, 3 of 3 cycles Administration: 162 mg (08/17/2018), 162 mg (08/24/2018), 162 mg (09/14/2018), 162 mg (08/31/2018), 162 mg (09/07/2018), 162 mg (09/21/2018), 162 mg (09/28/2018), 162 mg (10/05/2018), 162 mg (10/12/2018), 162 mg (10/19/2018), 162 mg (10/27/2018), 162 mg (11/02/2018) trastuzumab-dkst (OGIVRI) 450 mg in sodium chloride 0.9 % 250 mL chemo infusion, 450 mg (100 % of original dose 450 mg), Intravenous,  Once, 9 of 13 cycles Dose modification: 450 mg (original dose 450 mg, Cycle 4, Reason: Other (see comments), Comment: Biosimilar Conversion) Administration: 450 mg (11/23/2018), 450 mg (12/14/2018), 450 mg (01/04/2019), 450 mg (01/25/2019), 450 mg (02/15/2019), 450 mg (03/07/2019), 450 mg (03/29/2019), 450 mg (04/18/2019), 450 mg (05/09/2019)  for chemotherapy treatment.    12/05/2018 - 01/02/2019 Radiation Therapy   Adjuvant XRT   01/25/2019 -  Anti-estrogen oral therapy   Anastrozole 144mdaily, plan  5-7 years     CHIEF COMPLIANT: Follow-upon Herceptin maintenance  INTERVAL HISTORY: Sabrina [name] is a 69 y.o. with above-mentioned history of right breast cancer who  underwent a lumpectomy, adjuvant chemotherapy, andradiationtherapy. She is currently on treatment with Herceptin maintenanceandanti-estrogen therapywith anastrozole.She presents to the clinic todayfor treatment.   ALLERGIES:  is allergic to atorvastatin and meperidine.  MEDICATIONS:  Current Outpatient Medications  Medication Sig Dispense Refill  . anastrozole (ARIMIDEX) 1 MG tablet Take 1 tablet (1 mg total) by mouth daily. 90 tablet 3  . digoxin (LANOXIN) 0.125 MG tablet Take 1 tablet (125 mcg total) by mouth daily. 90 tablet 1  . flecainide (TAMBOCOR) 100 MG tablet Take 1 tablet (100 mg total) by mouth 2 (two) times daily. 180 tablet 1  . levothyroxine (SYNTHROID, LEVOTHROID) 100 MCG tablet Take 1 tablet by mouth daily.    Marland Kitchen lidocaine-prilocaine (EMLA) cream Apply to affected area once 30 g 3  . lisinopril (PRINIVIL,ZESTRIL) 10 MG tablet Take 1 tablet (10 mg total) by mouth daily. 90 tablet 3  . metFORMIN (GLUCOPHAGE) 500 MG tablet Take 1 tablet by mouth 2 (two) times daily with a meal.     . metoprolol succinate (TOPROL-XL) 50 MG 24 hr tablet TAKE 1 TABLET DAILY 90 tablet 3  . warfarin (COUMADIN) 10 MG tablet Take 1/2 to 1 tablet as directed by [facility] 90 tablet 3   No current facility-administered medications for this visit.    PHYSICAL EXAMINATION: ECOG PERFORMANCE STATUS: 1 - Symptomatic but completely ambulatory  Vitals:   05/30/19 1406  BP: (!) 138/58  Pulse: 81  Resp: 17  Temp: 98 F (36.7 C)  SpO2: 97%   Filed Weights   05/30/19 1406  Weight: 181 lb 9.6 oz (82.4 kg)    LABORATORY DATA:  I have reviewed the data as listed CMP Latest Ref Rng & Units 04/18/2019 03/07/2019 01/25/2019  Glucose 70 - 99 mg/dL 135(H) 133(H) 126(H)  BUN 8 - 23 mg/dL 12 15 13   Creatinine 0.44 - 1.00 mg/dL 0.69 0.65 0.62  Sodium 135 - 145 mmol/L 136 142 141  Potassium 3.5 - 5.1 mmol/L 4.0 4.2 4.1  Chloride 98 - 111 mmol/L 99 104 105  CO2 22 - 32 mmol/L 29 28 29   Calcium  8.9 - 10.3 mg/dL 8.9 9.1 9.0  Total Protein 6.5 - 8.1 g/dL 7.1 6.8 6.3(L)  Total Bilirubin 0.3 - 1.2 mg/dL 0.3 0.2(L) <0.2(L)  Alkaline Phos 38 - 126 U/L 96 84 87  AST 15 - 41 U/L 16 27 13(L)  ALT 0 - 44 U/L 10 12 6     Lab Results  Component Value Date   WBC 8.3 05/30/2019   HGB 9.5 (L) 05/30/2019   HCT 32.0 (L) 05/30/2019   MCV 79.2 (L) 05/30/2019   PLT 240 05/30/2019   NEUTROABS 5.6 05/30/2019    ASSESSMENT & PLAN:  Malignant neoplasm of upper-outer quadrant of right breast in female, estrogen receptor positive ([place]) 07/13/2018:Right lumpectomy ([facility]): IDC with DCIS, 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  Treatment plan: 1.Adj chemo with Taxol-Herceptin weekly X 12completed 7/23/2020and then q 3 weeks Herceptin. 2.Adjuvant radiation therapystarted 12/05/2018-01/02/19 3.Followed by adjuvant antiestrogen therapy ----------------------------------------------------------------------------------------------------------------------------------------------- Current treatment: Herceptin maintenance every 3 weeksuntil 08/01/2019 Monitoring closely for toxicities  Anastrozole toxicities: 1. Occasional hot flashes: Stable 2. occasional muscle aches and pains: Being monitored  Echocardiogram showed an EF of 60 to 65%: This is a we are monitoring  her for cardiac toxicities. Generalized fatigue: Improved Insomnia:  Take sleep aids Breast cancer surveillance: Mammogram need to be scheduled first week of April 2021.  Microcytic anemia: Her hemoglobin today is 9.5.  It has declined from before.  She does not have any bleeding symptoms.  I would like to check iron studies with ferritin today.  Return to clinic every 3 weeks for Herceptin every 6 weeks of follow-up with me    No orders of the defined types were placed in this encounter.  The patient has a good understanding of the overall plan. she agrees with it. she will  call with any problems that may develop before the next visit here.  Total time spent: 30 mins including face to face time and time spent for planning, charting and coordination of care  Nicholas Lose, MD 05/30/2019  I, Cloyde Reams Dorshimer, am acting as scribe for Dr. Nicholas Lose.  I have reviewed the above documentation for accuracy and completeness, and I agree with the above.

## 2019-05-29 NOTE — Patient Instructions (Signed)
Continue 10mg daily except 5mg on Mondays, Wednesdays and Saturdays. Continue eating 2 servings of leafy green vegetables each week. Recheck INR in 8 weeks.   

## 2019-05-30 ENCOUNTER — Inpatient Hospital Stay: Payer: Medicare Other | Attending: Hematology and Oncology

## 2019-05-30 ENCOUNTER — Inpatient Hospital Stay: Payer: Medicare Other

## 2019-05-30 ENCOUNTER — Other Ambulatory Visit: Payer: Self-pay | Admitting: Hematology and Oncology

## 2019-05-30 ENCOUNTER — Other Ambulatory Visit: Payer: Self-pay

## 2019-05-30 ENCOUNTER — Inpatient Hospital Stay (HOSPITAL_BASED_OUTPATIENT_CLINIC_OR_DEPARTMENT_OTHER): Payer: Medicare Other | Admitting: Hematology and Oncology

## 2019-05-30 VITALS — BP 138/58 | HR 81 | Temp 98.0°F | Resp 17 | Ht 69.0 in | Wt 181.6 lb

## 2019-05-30 DIAGNOSIS — D509 Iron deficiency anemia, unspecified: Secondary | ICD-10-CM

## 2019-05-30 DIAGNOSIS — Z170 Estrogen receptor positive status [ER+]: Secondary | ICD-10-CM | POA: Diagnosis not present

## 2019-05-30 DIAGNOSIS — Z79811 Long term (current) use of aromatase inhibitors: Secondary | ICD-10-CM | POA: Insufficient documentation

## 2019-05-30 DIAGNOSIS — Z5112 Encounter for antineoplastic immunotherapy: Secondary | ICD-10-CM | POA: Diagnosis not present

## 2019-05-30 DIAGNOSIS — Z95828 Presence of other vascular implants and grafts: Secondary | ICD-10-CM

## 2019-05-30 DIAGNOSIS — Z79899 Other long term (current) drug therapy: Secondary | ICD-10-CM | POA: Diagnosis not present

## 2019-05-30 DIAGNOSIS — C50411 Malignant neoplasm of upper-outer quadrant of right female breast: Secondary | ICD-10-CM

## 2019-05-30 DIAGNOSIS — Z7901 Long term (current) use of anticoagulants: Secondary | ICD-10-CM | POA: Insufficient documentation

## 2019-05-30 DIAGNOSIS — Z7984 Long term (current) use of oral hypoglycemic drugs: Secondary | ICD-10-CM | POA: Diagnosis not present

## 2019-05-30 DIAGNOSIS — Z923 Personal history of irradiation: Secondary | ICD-10-CM | POA: Insufficient documentation

## 2019-05-30 LAB — FERRITIN: Ferritin: 6 ng/mL — ABNORMAL LOW (ref 11–307)

## 2019-05-30 LAB — CBC WITH DIFFERENTIAL (CANCER CENTER ONLY)
Abs Immature Granulocytes: 0.02 10*3/uL (ref 0.00–0.07)
Basophils Absolute: 0.1 10*3/uL (ref 0.0–0.1)
Basophils Relative: 1 %
Eosinophils Absolute: 0.2 10*3/uL (ref 0.0–0.5)
Eosinophils Relative: 2 %
HCT: 32 % — ABNORMAL LOW (ref 36.0–46.0)
Hemoglobin: 9.5 g/dL — ABNORMAL LOW (ref 12.0–15.0)
Immature Granulocytes: 0 %
Lymphocytes Relative: 22 %
Lymphs Abs: 1.8 10*3/uL (ref 0.7–4.0)
MCH: 23.5 pg — ABNORMAL LOW (ref 26.0–34.0)
MCHC: 29.7 g/dL — ABNORMAL LOW (ref 30.0–36.0)
MCV: 79.2 fL — ABNORMAL LOW (ref 80.0–100.0)
Monocytes Absolute: 0.6 10*3/uL (ref 0.1–1.0)
Monocytes Relative: 7 %
Neutro Abs: 5.6 10*3/uL (ref 1.7–7.7)
Neutrophils Relative %: 68 %
Platelet Count: 240 10*3/uL (ref 150–400)
RBC: 4.04 MIL/uL (ref 3.87–5.11)
RDW: 16.1 % — ABNORMAL HIGH (ref 11.5–15.5)
WBC Count: 8.3 10*3/uL (ref 4.0–10.5)
nRBC: 0 % (ref 0.0–0.2)

## 2019-05-30 LAB — CMP (CANCER CENTER ONLY)
ALT: 10 U/L (ref 0–44)
AST: 15 U/L (ref 15–41)
Albumin: 3.7 g/dL (ref 3.5–5.0)
Alkaline Phosphatase: 82 U/L (ref 38–126)
Anion gap: 10 (ref 5–15)
BUN: 11 mg/dL (ref 8–23)
CO2: 29 mmol/L (ref 22–32)
Calcium: 9.2 mg/dL (ref 8.9–10.3)
Chloride: 101 mmol/L (ref 98–111)
Creatinine: 0.66 mg/dL (ref 0.44–1.00)
GFR, Est AFR Am: >60 mL/min (ref >60)
GFR, Estimated: >60 mL/min (ref >60)
Glucose, Bld: 128 mg/dL — ABNORMAL HIGH (ref 70–99)
Potassium: 4.3 mmol/L (ref 3.5–5.1)
Sodium: 140 mmol/L (ref 135–145)
Total Bilirubin: <0.2 mg/dL — ABNORMAL LOW (ref 0.3–1.2)
Total Protein: 7.1 g/dL (ref 6.5–8.1)

## 2019-05-30 LAB — IRON AND TIBC
Iron: 19 ug/dL — ABNORMAL LOW (ref 41–142)
Saturation Ratios: 4 % — ABNORMAL LOW (ref 21–57)
TIBC: 499 ug/dL — ABNORMAL HIGH (ref 236–444)
UIBC: 480 ug/dL — ABNORMAL HIGH (ref 120–384)

## 2019-05-30 MED ORDER — SODIUM CHLORIDE 0.9% FLUSH
10.0000 mL | INTRAVENOUS | Status: DC | PRN
  Administered 2019-05-30: 10 mL
  Filled 2019-05-30: qty 10

## 2019-05-30 MED ORDER — SODIUM CHLORIDE 0.9 % IV SOLN
Freq: Once | INTRAVENOUS | Status: AC
  Filled 2019-05-30: qty 250

## 2019-05-30 MED ORDER — DIPHENHYDRAMINE HCL 25 MG PO CAPS
ORAL_CAPSULE | ORAL | Status: AC
  Filled 2019-05-30: qty 2

## 2019-05-30 MED ORDER — ACETAMINOPHEN 325 MG PO TABS
ORAL_TABLET | ORAL | Status: AC
  Filled 2019-05-30: qty 2

## 2019-05-30 MED ORDER — SODIUM CHLORIDE 0.9% FLUSH
10.0000 mL | Freq: Once | INTRAVENOUS | Status: AC
  Administered 2019-05-30: 10 mL
  Filled 2019-05-30: qty 10

## 2019-05-30 MED ORDER — ACETAMINOPHEN 325 MG PO TABS
650.0000 mg | ORAL_TABLET | Freq: Once | ORAL | Status: AC
  Administered 2019-05-30: 650 mg via ORAL

## 2019-05-30 MED ORDER — HEPARIN SOD (PORK) LOCK FLUSH 100 UNIT/ML IV SOLN
500.0000 [IU] | Freq: Once | INTRAVENOUS | Status: AC | PRN
  Administered 2019-05-30: 500 [IU]
  Filled 2019-05-30: qty 5

## 2019-05-30 MED ORDER — DIPHENHYDRAMINE HCL 25 MG PO CAPS
50.0000 mg | ORAL_CAPSULE | Freq: Once | ORAL | Status: AC
  Administered 2019-05-30: 50 mg via ORAL

## 2019-05-30 MED ORDER — TRASTUZUMAB-DKST CHEMO 150 MG IV SOLR
450.0000 mg | Freq: Once | INTRAVENOUS | Status: AC
  Administered 2019-05-30: 450 mg via INTRAVENOUS
  Filled 2019-05-30: qty 21.43

## 2019-05-30 NOTE — Patient Instructions (Signed)

## 2019-05-30 NOTE — Progress Notes (Signed)
Patient was found to be iron deficient. I recommended giving intravenous iron therapy. She will need 4 doses of Venofer which will be scheduled for the next week or 2.

## 2019-05-30 NOTE — Assessment & Plan Note (Signed)
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  Treatment plan: 1.Adj chemo with Taxol-Herceptin weekly X 12completed 7/23/2020and then q 3 weeks Herceptin. 2.Adjuvant radiation therapystarted 12/05/2018-01/02/19 3.Followed by adjuvant antiestrogen therapy ----------------------------------------------------------------------------------------------------------------------------------------------- Current treatment: Herceptin maintenance every 3 weeksuntil 08/01/2019 Monitoring closely for toxicities  Anastrozole toxicities: 1. Occasional hot flashes: Stable 2. occasional muscle aches and pains: Being monitored  Echocardiogram showed an EF of 60 to 65%: This is a we are monitoring her for cardiac toxicities. Generalized fatigue: Improved Insomnia:  Take sleep aids Breast cancer surveillance: Mammogram need to be scheduled first week of April 2021.  Return to clinic every 3 weeks for Herceptin every 6 weeks of follow-up with me

## 2019-05-30 NOTE — Patient Instructions (Signed)
[facility] °Discharge Instructions for Patients Receiving Chemotherapy ° °Today you received the following chemotherapy agents Trastuzumab ° °To help prevent nausea and vomiting after your treatment, we encourage you to take your nausea medication as directed. °  °If you develop nausea and vomiting that is not controlled by your nausea medication, call the clinic.  ° °BELOW ARE SYMPTOMS THAT SHOULD BE REPORTED IMMEDIATELY: °· *FEVER GREATER THAN 100.5 F °· *CHILLS WITH OR WITHOUT FEVER °· NAUSEA AND VOMITING THAT IS NOT CONTROLLED WITH YOUR NAUSEA MEDICATION °· *UNUSUAL SHORTNESS OF BREATH °· *UNUSUAL BRUISING OR BLEEDING °· TENDERNESS IN MOUTH AND THROAT WITH OR WITHOUT PRESENCE OF ULCERS °· *URINARY PROBLEMS °· *BOWEL PROBLEMS °· UNUSUAL RASH °Items with * indicate a potential emergency and should be followed up as soon as possible. ° °Feel free to call the clinic should you have any questions or concerns. The clinic phone number is [phone]. ° °Please show the CHEMO ALERT CARD at check-in to the Emergency Department and triage nurse. ° ° °

## 2019-05-31 ENCOUNTER — Telehealth: Payer: Self-pay | Admitting: Hematology and Oncology

## 2019-05-31 NOTE — Telephone Encounter (Signed)
I talk with patient regarding schedule  

## 2019-06-06 ENCOUNTER — Other Ambulatory Visit: Payer: Self-pay

## 2019-06-06 ENCOUNTER — Inpatient Hospital Stay: Payer: Medicare Other

## 2019-06-06 VITALS — BP 129/64 | HR 61 | Temp 98.5°F | Resp 17

## 2019-06-06 DIAGNOSIS — C50411 Malignant neoplasm of upper-outer quadrant of right female breast: Secondary | ICD-10-CM

## 2019-06-06 DIAGNOSIS — Z170 Estrogen receptor positive status [ER+]: Secondary | ICD-10-CM

## 2019-06-06 DIAGNOSIS — Z95828 Presence of other vascular implants and grafts: Secondary | ICD-10-CM

## 2019-06-06 DIAGNOSIS — Z5112 Encounter for antineoplastic immunotherapy: Secondary | ICD-10-CM | POA: Diagnosis not present

## 2019-06-06 MED ORDER — SODIUM CHLORIDE 0.9 % IV SOLN
200.0000 mg | Freq: Once | INTRAVENOUS | Status: AC
  Administered 2019-06-06: 14:00:00 200 mg via INTRAVENOUS
  Filled 2019-06-06: qty 200

## 2019-06-06 MED ORDER — DIPHENHYDRAMINE HCL 25 MG PO CAPS
ORAL_CAPSULE | ORAL | Status: AC
  Filled 2019-06-06: qty 2

## 2019-06-06 MED ORDER — ACETAMINOPHEN 325 MG PO TABS
ORAL_TABLET | ORAL | Status: AC
  Filled 2019-06-06: qty 2

## 2019-06-06 MED ORDER — HEPARIN SOD (PORK) LOCK FLUSH 100 UNIT/ML IV SOLN
500.0000 [IU] | Freq: Once | INTRAVENOUS | Status: AC | PRN
  Administered 2019-06-06: 500 [IU]
  Filled 2019-06-06: qty 5

## 2019-06-06 MED ORDER — SODIUM CHLORIDE 0.9% FLUSH
10.0000 mL | Freq: Once | INTRAVENOUS | Status: AC | PRN
  Administered 2019-06-06: 10 mL
  Filled 2019-06-06: qty 10

## 2019-06-06 MED ORDER — SODIUM CHLORIDE 0.9 % IV SOLN
Freq: Once | INTRAVENOUS | Status: AC
  Filled 2019-06-06: qty 250

## 2019-06-06 MED ORDER — DIPHENHYDRAMINE HCL 25 MG PO CAPS
50.0000 mg | ORAL_CAPSULE | Freq: Once | ORAL | Status: AC
  Administered 2019-06-06: 50 mg via ORAL

## 2019-06-06 MED ORDER — ACETAMINOPHEN 325 MG PO TABS
650.0000 mg | ORAL_TABLET | Freq: Once | ORAL | Status: AC
  Administered 2019-06-06: 13:00:00 650 mg via ORAL

## 2019-06-06 NOTE — Patient Instructions (Signed)
COVID-19 Vaccine Information can be found at: [email or web address] For questions related to vaccine distribution or appointments, please email [email or web address] or call [phone].   Iron Sucrose injection What is this medicine? IRON SUCROSE (AHY ern SOO krohs) is an iron complex. Iron is used to make healthy red blood cells, which carry oxygen and nutrients throughout the body. This medicine is used to treat iron deficiency anemia in people with chronic kidney disease. This medicine may be used for other purposes; ask your health care provider or pharmacist if you have questions. COMMON BRAND NAME(S): Venofer What should I tell my health care provider before I take this medicine? They need to know if you have any of these conditions:  anemia not caused by low iron levels  heart disease  high levels of iron in the blood  kidney disease  liver disease  an unusual or allergic reaction to iron, other medicines, foods, dyes, or preservatives  pregnant or trying to get pregnant  breast-feeding How should I use this medicine? This medicine is for infusion into a vein. It is given by a health care professional in a hospital or clinic setting. Talk to your pediatrician regarding the use of this medicine in children. While this drug may be prescribed for children as young as 2 years for selected conditions, precautions do apply. Overdosage: If you think you have taken too much of this medicine contact a poison control center or emergency room at once. NOTE: This medicine is only for you. Do not share this medicine with others. What if I miss a dose? It is important not to miss your dose. Call your doctor or health care professional if you are unable to keep an appointment. What may interact with this medicine? Do not take this medicine with any of the following medications:  deferoxamine  dimercaprol  other iron  products This medicine may also interact with the following medications:  chloramphenicol  deferasirox This list may not describe all possible interactions. Give your health care provider a list of all the medicines, herbs, non-prescription drugs, or dietary supplements you use. Also tell them if you smoke, drink alcohol, or use illegal drugs. Some items may interact with your medicine. What should I watch for while using this medicine? Visit your doctor or healthcare professional regularly. Tell your doctor or healthcare professional if your symptoms do not start to get better or if they get worse. You may need blood work done while you are taking this medicine. You may need to follow a special diet. Talk to your doctor. Foods that contain iron include: whole grains/cereals, dried fruits, beans, or peas, leafy green vegetables, and organ meats (liver, kidney). What side effects may I notice from receiving this medicine? Side effects that you should report to your doctor or health care professional as soon as possible:  allergic reactions like skin rash, itching or hives, swelling of the face, lips, or tongue  breathing problems  changes in blood pressure  cough  fast, irregular heartbeat  feeling faint or lightheaded, falls  fever or chills  flushing, sweating, or hot feelings  joint or muscle aches/pains  seizures  swelling of the ankles or feet  unusually weak or tired Side effects that usually do not require medical attention (report to your doctor or health care professional if they continue or are bothersome):  diarrhea  feeling achy  headache  irritation at site where injected  nausea, vomiting  stomach upset  tiredness This list may not describe all possible   side effects. Call your doctor for medical advice about side effects. You may report side effects to FDA at 1-800-FDA-1088. Where should I keep my medicine? This drug is given in a hospital or clinic  and will not be stored at home. NOTE: This sheet is a summary. It may not cover all possible information. If you have questions about this medicine, talk to your doctor, pharmacist, or health care provider.  2020 Elsevier/Gold Standard (2011-01-07 17:14:35)  Coronavirus (COVID-19) Are you at risk?  Are you at risk for the Coronavirus (COVID-19)?  To be considered HIGH RISK for Coronavirus (COVID-19), you have to meet the following criteria:  . Traveled to [place], [place], [place], [place] or [place]; or in the [place] to [place], [place], [place], or [place]; and have fever, cough, and shortness of breath within the last 2 weeks of travel OR . Been in close contact with a person diagnosed with COVID-19 within the last 2 weeks and have fever, cough, and shortness of breath . IF YOU DO NOT MEET THESE CRITERIA, YOU ARE CONSIDERED LOW RISK FOR COVID-19.  What to do if you are HIGH RISK for COVID-19?  . If you are having a medical emergency, call 911. . Seek medical care right away. Before you go to a doctor's office, urgent care or emergency department, call ahead and tell them about your recent travel, contact with someone diagnosed with COVID-19, and your symptoms. You should receive instructions from your physician's office regarding next steps of care.  . When you arrive at healthcare provider, tell the healthcare staff immediately you have returned from visiting [place], [place], [place], [place] or [place]; or traveled in the [place] to [place], [place], [place], or [place]; in the last two weeks or you have been in close contact with a person diagnosed with COVID-19 in the last 2 weeks.   . Tell the health care staff about your symptoms: fever, cough and shortness of breath. . After you have been seen by a medical provider, you will be either: o Tested for (COVID-19) and discharged home on quarantine except to seek medical care if symptoms worsen, and asked to   - Stay home and avoid contact with others until you get your results (4-5 days)  - Avoid travel on public transportation if possible (such as bus, train, or airplane) or o Sent to the Emergency Department by EMS for evaluation, COVID-19 testing, and possible admission depending on your condition and test results.  What to do if you are LOW RISK for COVID-19?  Reduce your risk of any infection by using the same precautions used for avoiding the common cold or flu:  . Wash your hands often with soap and warm water for at least 20 seconds.  If soap and water are not readily available, use an alcohol-based hand sanitizer with at least 60% alcohol.  . If coughing or sneezing, cover your mouth and nose by coughing or sneezing into the elbow areas of your shirt or coat, into a tissue or into your sleeve (not your hands). . Avoid shaking hands with others and consider head nods or verbal greetings only. . Avoid touching your eyes, nose, or mouth with unwashed hands.  . Avoid close contact with people who are sick. . Avoid places or events with large numbers of people in one location, like concerts or sporting events. . Carefully consider travel plans you have or are making. . If you are planning any travel   outside or inside the [place], visit the CDC's Travelers' Health webpage for the latest health notices. . If you have some symptoms but not all symptoms, continue to monitor at home and seek medical attention if your symptoms worsen. . If you are having a medical emergency, call 911.   ADDITIONAL HEALTHCARE OPTIONS FOR PATIENTS  [facility] / e-Visit: [email or web address]         [facility]: [phone]  [facility]: [phone]                   [facility]: [phone]   

## 2019-06-07 ENCOUNTER — Other Ambulatory Visit: Payer: Self-pay | Admitting: Cardiology

## 2019-06-08 ENCOUNTER — Other Ambulatory Visit: Payer: Self-pay

## 2019-06-08 ENCOUNTER — Inpatient Hospital Stay: Payer: Medicare Other

## 2019-06-08 VITALS — BP 145/62 | HR 62 | Temp 98.1°F | Resp 20

## 2019-06-08 DIAGNOSIS — Z95828 Presence of other vascular implants and grafts: Secondary | ICD-10-CM

## 2019-06-08 DIAGNOSIS — Z5112 Encounter for antineoplastic immunotherapy: Secondary | ICD-10-CM | POA: Diagnosis not present

## 2019-06-08 DIAGNOSIS — C50411 Malignant neoplasm of upper-outer quadrant of right female breast: Secondary | ICD-10-CM

## 2019-06-08 DIAGNOSIS — Z170 Estrogen receptor positive status [ER+]: Secondary | ICD-10-CM

## 2019-06-08 MED ORDER — SODIUM CHLORIDE 0.9% FLUSH
10.0000 mL | Freq: Once | INTRAVENOUS | Status: AC
  Administered 2019-06-08: 15:00:00 10 mL
  Filled 2019-06-08: qty 10

## 2019-06-08 MED ORDER — SODIUM CHLORIDE 0.9 % IV SOLN
Freq: Once | INTRAVENOUS | Status: AC
  Filled 2019-06-08: qty 250

## 2019-06-08 MED ORDER — DIPHENHYDRAMINE HCL 25 MG PO CAPS
50.0000 mg | ORAL_CAPSULE | Freq: Once | ORAL | Status: AC
  Administered 2019-06-08: 14:00:00 50 mg via ORAL

## 2019-06-08 MED ORDER — ACETAMINOPHEN 325 MG PO TABS
ORAL_TABLET | ORAL | Status: AC
  Filled 2019-06-08: qty 2

## 2019-06-08 MED ORDER — DIPHENHYDRAMINE HCL 25 MG PO CAPS
ORAL_CAPSULE | ORAL | Status: AC
  Filled 2019-06-08: qty 2

## 2019-06-08 MED ORDER — HEPARIN SOD (PORK) LOCK FLUSH 100 UNIT/ML IV SOLN
500.0000 [IU] | Freq: Once | INTRAVENOUS | Status: AC
  Administered 2019-06-08: 500 [IU]
  Filled 2019-06-08: qty 5

## 2019-06-08 MED ORDER — SODIUM CHLORIDE 0.9 % IV SOLN
200.0000 mg | Freq: Once | INTRAVENOUS | Status: AC
  Administered 2019-06-08: 14:00:00 200 mg via INTRAVENOUS
  Filled 2019-06-08: qty 200

## 2019-06-08 MED ORDER — ACETAMINOPHEN 325 MG PO TABS
650.0000 mg | ORAL_TABLET | Freq: Once | ORAL | Status: AC
  Administered 2019-06-08: 14:00:00 650 mg via ORAL

## 2019-06-08 NOTE — Patient Instructions (Signed)
Iron Sucrose injection What is this medicine? IRON SUCROSE (AHY ern SOO krohs) is an iron complex. Iron is used to make healthy red blood cells, which carry oxygen and nutrients throughout the body. This medicine is used to treat iron deficiency anemia in people with chronic kidney disease. This medicine may be used for other purposes; ask your health care provider or pharmacist if you have questions. COMMON BRAND NAME(S): Venofer What should I tell my health care provider before I take this medicine? They need to know if you have any of these conditions:  anemia not caused by low iron levels  heart disease  high levels of iron in the blood  kidney disease  liver disease  an unusual or allergic reaction to iron, other medicines, foods, dyes, or preservatives  pregnant or trying to get pregnant  breast-feeding How should I use this medicine? This medicine is for infusion into a vein. It is given by a health care professional in a hospital or clinic setting. Talk to your pediatrician regarding the use of this medicine in children. While this drug may be prescribed for children as young as 2 years for selected conditions, precautions do apply. Overdosage: If you think you have taken too much of this medicine contact a poison control center or emergency room at once. NOTE: This medicine is only for you. Do not share this medicine with others. What if I miss a dose? It is important not to miss your dose. Call your doctor or health care professional if you are unable to keep an appointment. What may interact with this medicine? Do not take this medicine with any of the following medications:  deferoxamine  dimercaprol  other iron products This medicine may also interact with the following medications:  chloramphenicol  deferasirox This list may not describe all possible interactions. Give your health care provider a list of all the medicines, herbs, non-prescription drugs, or  dietary supplements you use. Also tell them if you smoke, drink alcohol, or use illegal drugs. Some items may interact with your medicine. What should I watch for while using this medicine? Visit your doctor or healthcare professional regularly. Tell your doctor or healthcare professional if your symptoms do not start to get better or if they get worse. You may need blood work done while you are taking this medicine. You may need to follow a special diet. Talk to your doctor. Foods that contain iron include: whole grains/cereals, dried fruits, beans, or peas, leafy green vegetables, and organ meats (liver, kidney). What side effects may I notice from receiving this medicine? Side effects that you should report to your doctor or health care professional as soon as possible:  allergic reactions like skin rash, itching or hives, swelling of the face, lips, or tongue  breathing problems  changes in blood pressure  cough  fast, irregular heartbeat  feeling faint or lightheaded, falls  fever or chills  flushing, sweating, or hot feelings  joint or muscle aches/pains  seizures  swelling of the ankles or feet  unusually weak or tired Side effects that usually do not require medical attention (report to your doctor or health care professional if they continue or are bothersome):  diarrhea  feeling achy  headache  irritation at site where injected  nausea, vomiting  stomach upset  tiredness This list may not describe all possible side effects. Call your doctor for medical advice about side effects. You may report side effects to FDA at 1-800-FDA-1088. Where should I keep   my medicine? This drug is given in a hospital or clinic and will not be stored at home. NOTE: This sheet is a summary. It may not cover all possible information. If you have questions about this medicine, talk to your doctor, pharmacist, or health care provider.  2020 Elsevier/Gold Standard (2011-01-07  17:14:35) Coronavirus (COVID-19) Are you at risk?  Are you at risk for the Coronavirus (COVID-19)?  To be considered HIGH RISK for Coronavirus (COVID-19), you have to meet the following criteria:  . Traveled to [place], [place], [place], [place] or [place]; or in the [place] to [place], [place], [place], or [place]; and have fever, cough, and shortness of breath within the last 2 weeks of travel OR . Been in close contact with a person diagnosed with COVID-19 within the last 2 weeks and have fever, cough, and shortness of breath . IF YOU DO NOT MEET THESE CRITERIA, YOU ARE CONSIDERED LOW RISK FOR COVID-19.  What to do if you are HIGH RISK for COVID-19?  . If you are having a medical emergency, call 911. . Seek medical care right away. Before you go to a doctor's office, urgent care or emergency department, call ahead and tell them about your recent travel, contact with someone diagnosed with COVID-19, and your symptoms. You should receive instructions from your physician's office regarding next steps of care.  . When you arrive at healthcare provider, tell the healthcare staff immediately you have returned from visiting [place], [place], [place], [place] or [place]; or traveled in the [place] to [place], [place], [place], or [place]; in the last two weeks or you have been in close contact with a person diagnosed with COVID-19 in the last 2 weeks.   . Tell the health care staff about your symptoms: fever, cough and shortness of breath. . After you have been seen by a medical provider, you will be either: o Tested for (COVID-19) and discharged home on quarantine except to seek medical care if symptoms worsen, and asked to  - Stay home and avoid contact with others until you get your results (4-5 days)  - Avoid travel on public transportation if possible (such as bus, train, or airplane) or o Sent to the Emergency Department by EMS for evaluation, COVID-19 testing, and  possible admission depending on your condition and test results.  What to do if you are LOW RISK for COVID-19?  Reduce your risk of any infection by using the same precautions used for avoiding the common cold or flu:  . Wash your hands often with soap and warm water for at least 20 seconds.  If soap and water are not readily available, use an alcohol-based hand sanitizer with at least 60% alcohol.  . If coughing or sneezing, cover your mouth and nose by coughing or sneezing into the elbow areas of your shirt or coat, into a tissue or into your sleeve (not your hands). . Avoid shaking hands with others and consider head nods or verbal greetings only. . Avoid touching your eyes, nose, or mouth with unwashed hands.  . Avoid close contact with people who are sick. . Avoid places or events with large numbers of people in one location, like concerts or sporting events. . Carefully consider travel plans you have or are making. . If you are planning any travel outside or inside the [place], visit the CDC's Travelers' Health webpage for the latest health notices. . If you have some symptoms but not all   symptoms, continue to monitor at home and seek medical attention if your symptoms worsen. . If you are having a medical emergency, call 911.   ADDITIONAL HEALTHCARE OPTIONS FOR PATIENTS  [facility] / e-Visit: [email or web address]         [facility]: [phone]  [facility]: [phone]                   [facility]: [phone]  

## 2019-06-13 ENCOUNTER — Other Ambulatory Visit: Payer: Self-pay

## 2019-06-13 ENCOUNTER — Inpatient Hospital Stay: Payer: Medicare Other | Attending: Hematology and Oncology

## 2019-06-13 VITALS — BP 141/60 | HR 62 | Temp 98.4°F | Resp 18 | Wt 175.2 lb

## 2019-06-13 DIAGNOSIS — Z79811 Long term (current) use of aromatase inhibitors: Secondary | ICD-10-CM | POA: Insufficient documentation

## 2019-06-13 DIAGNOSIS — Z7984 Long term (current) use of oral hypoglycemic drugs: Secondary | ICD-10-CM | POA: Diagnosis not present

## 2019-06-13 DIAGNOSIS — Z5112 Encounter for antineoplastic immunotherapy: Secondary | ICD-10-CM | POA: Insufficient documentation

## 2019-06-13 DIAGNOSIS — Z79899 Other long term (current) drug therapy: Secondary | ICD-10-CM | POA: Diagnosis not present

## 2019-06-13 DIAGNOSIS — Z7901 Long term (current) use of anticoagulants: Secondary | ICD-10-CM | POA: Diagnosis not present

## 2019-06-13 DIAGNOSIS — C50411 Malignant neoplasm of upper-outer quadrant of right female breast: Secondary | ICD-10-CM | POA: Insufficient documentation

## 2019-06-13 DIAGNOSIS — Z170 Estrogen receptor positive status [ER+]: Secondary | ICD-10-CM | POA: Diagnosis not present

## 2019-06-13 DIAGNOSIS — Z95828 Presence of other vascular implants and grafts: Secondary | ICD-10-CM

## 2019-06-13 DIAGNOSIS — Z923 Personal history of irradiation: Secondary | ICD-10-CM | POA: Insufficient documentation

## 2019-06-13 DIAGNOSIS — E611 Iron deficiency: Secondary | ICD-10-CM | POA: Diagnosis not present

## 2019-06-13 MED ORDER — HEPARIN SOD (PORK) LOCK FLUSH 100 UNIT/ML IV SOLN
500.0000 [IU] | Freq: Once | INTRAVENOUS | Status: AC | PRN
  Administered 2019-06-13: 500 [IU]
  Filled 2019-06-13: qty 5

## 2019-06-13 MED ORDER — DIPHENHYDRAMINE HCL 25 MG PO CAPS
ORAL_CAPSULE | ORAL | Status: AC
  Filled 2019-06-13: qty 2

## 2019-06-13 MED ORDER — SODIUM CHLORIDE 0.9 % IV SOLN
200.0000 mg | Freq: Once | INTRAVENOUS | Status: AC
  Administered 2019-06-13: 200 mg via INTRAVENOUS
  Filled 2019-06-13: qty 200

## 2019-06-13 MED ORDER — ACETAMINOPHEN 325 MG PO TABS
ORAL_TABLET | ORAL | Status: AC
  Filled 2019-06-13: qty 2

## 2019-06-13 MED ORDER — SODIUM CHLORIDE 0.9% FLUSH
10.0000 mL | Freq: Once | INTRAVENOUS | Status: AC | PRN
  Administered 2019-06-13: 10 mL
  Filled 2019-06-13: qty 10

## 2019-06-13 MED ORDER — SODIUM CHLORIDE 0.9 % IV SOLN
Freq: Once | INTRAVENOUS | Status: AC
  Filled 2019-06-13: qty 250

## 2019-06-13 MED ORDER — ACETAMINOPHEN 325 MG PO TABS
650.0000 mg | ORAL_TABLET | Freq: Once | ORAL | Status: AC
  Administered 2019-06-13: 650 mg via ORAL

## 2019-06-13 MED ORDER — DIPHENHYDRAMINE HCL 25 MG PO CAPS
50.0000 mg | ORAL_CAPSULE | Freq: Once | ORAL | Status: AC
  Administered 2019-06-13: 50 mg via ORAL

## 2019-06-13 NOTE — Patient Instructions (Signed)
Iron Sucrose injection What is this medicine? IRON SUCROSE (AHY ern SOO krohs) is an iron complex. Iron is used to make healthy red blood cells, which carry oxygen and nutrients throughout the body. This medicine is used to treat iron deficiency anemia in people with chronic kidney disease. This medicine may be used for other purposes; ask your health care provider or pharmacist if you have questions. COMMON BRAND NAME(S): Venofer What should I tell my health care provider before I take this medicine? They need to know if you have any of these conditions:  anemia not caused by low iron levels  heart disease  high levels of iron in the blood  kidney disease  liver disease  an unusual or allergic reaction to iron, other medicines, foods, dyes, or preservatives  pregnant or trying to get pregnant  breast-feeding How should I use this medicine? This medicine is for infusion into a vein. It is given by a health care professional in a hospital or clinic setting. Talk to your pediatrician regarding the use of this medicine in children. While this drug may be prescribed for children as young as 2 years for selected conditions, precautions do apply. Overdosage: If you think you have taken too much of this medicine contact a poison control center or emergency room at once. NOTE: This medicine is only for you. Do not share this medicine with others. What if I miss a dose? It is important not to miss your dose. Call your doctor or health care professional if you are unable to keep an appointment. What may interact with this medicine? Do not take this medicine with any of the following medications:  deferoxamine  dimercaprol  other iron products This medicine may also interact with the following medications:  chloramphenicol  deferasirox This list may not describe all possible interactions. Give your health care provider a list of all the medicines, herbs, non-prescription drugs, or  dietary supplements you use. Also tell them if you smoke, drink alcohol, or use illegal drugs. Some items may interact with your medicine. What should I watch for while using this medicine? Visit your doctor or healthcare professional regularly. Tell your doctor or healthcare professional if your symptoms do not start to get better or if they get worse. You may need blood work done while you are taking this medicine. You may need to follow a special diet. Talk to your doctor. Foods that contain iron include: whole grains/cereals, dried fruits, beans, or peas, leafy green vegetables, and organ meats (liver, kidney). What side effects may I notice from receiving this medicine? Side effects that you should report to your doctor or health care professional as soon as possible:  allergic reactions like skin rash, itching or hives, swelling of the face, lips, or tongue  breathing problems  changes in blood pressure  cough  fast, irregular heartbeat  feeling faint or lightheaded, falls  fever or chills  flushing, sweating, or hot feelings  joint or muscle aches/pains  seizures  swelling of the ankles or feet  unusually weak or tired Side effects that usually do not require medical attention (report to your doctor or health care professional if they continue or are bothersome):  diarrhea  feeling achy  headache  irritation at site where injected  nausea, vomiting  stomach upset  tiredness This list may not describe all possible side effects. Call your doctor for medical advice about side effects. You may report side effects to FDA at 1-800-FDA-1088. Where should I keep   my medicine? This drug is given in a hospital or clinic and will not be stored at home. NOTE: This sheet is a summary. It may not cover all possible information. If you have questions about this medicine, talk to your doctor, pharmacist, or health care provider.  2020 Elsevier/Gold Standard (2011-01-07  17:14:35) Coronavirus (COVID-19) Are you at risk?  Are you at risk for the Coronavirus (COVID-19)?  To be considered HIGH RISK for Coronavirus (COVID-19), you have to meet the following criteria:  . Traveled to [place], [place], [place], [place] or [place]; or in the [place] to [place], [place], [place], or [place]; and have fever, cough, and shortness of breath within the last 2 weeks of travel OR . Been in close contact with a person diagnosed with COVID-19 within the last 2 weeks and have fever, cough, and shortness of breath . IF YOU DO NOT MEET THESE CRITERIA, YOU ARE CONSIDERED LOW RISK FOR COVID-19.  What to do if you are HIGH RISK for COVID-19?  . If you are having a medical emergency, call 911. . Seek medical care right away. Before you go to a doctor's office, urgent care or emergency department, call ahead and tell them about your recent travel, contact with someone diagnosed with COVID-19, and your symptoms. You should receive instructions from your physician's office regarding next steps of care.  . When you arrive at healthcare provider, tell the healthcare staff immediately you have returned from visiting [place], [place], [place], [place] or [place]; or traveled in the [place] to [place], [place], [place], or [place]; in the last two weeks or you have been in close contact with a person diagnosed with COVID-19 in the last 2 weeks.   . Tell the health care staff about your symptoms: fever, cough and shortness of breath. . After you have been seen by a medical provider, you will be either: o Tested for (COVID-19) and discharged home on quarantine except to seek medical care if symptoms worsen, and asked to  - Stay home and avoid contact with others until you get your results (4-5 days)  - Avoid travel on public transportation if possible (such as bus, train, or airplane) or o Sent to the Emergency Department by EMS for evaluation, COVID-19 testing, and  possible admission depending on your condition and test results.  What to do if you are LOW RISK for COVID-19?  Reduce your risk of any infection by using the same precautions used for avoiding the common cold or flu:  . Wash your hands often with soap and warm water for at least 20 seconds.  If soap and water are not readily available, use an alcohol-based hand sanitizer with at least 60% alcohol.  . If coughing or sneezing, cover your mouth and nose by coughing or sneezing into the elbow areas of your shirt or coat, into a tissue or into your sleeve (not your hands). . Avoid shaking hands with others and consider head nods or verbal greetings only. . Avoid touching your eyes, nose, or mouth with unwashed hands.  . Avoid close contact with people who are sick. . Avoid places or events with large numbers of people in one location, like concerts or sporting events. . Carefully consider travel plans you have or are making. . If you are planning any travel outside or inside the [place], visit the CDC's Travelers' Health webpage for the latest health notices. . If you have some symptoms but not all   symptoms, continue to monitor at home and seek medical attention if your symptoms worsen. . If you are having a medical emergency, call 911.   ADDITIONAL HEALTHCARE OPTIONS FOR PATIENTS  [facility] / e-Visit: [email or web address]         [facility]: [phone]  [facility]: [phone]                   [facility]: [phone]  

## 2019-06-15 ENCOUNTER — Other Ambulatory Visit: Payer: Self-pay

## 2019-06-15 ENCOUNTER — Inpatient Hospital Stay: Payer: Medicare Other

## 2019-06-15 ENCOUNTER — Ambulatory Visit
Admission: RE | Admit: 2019-06-15 | Discharge: 2019-06-15 | Disposition: A | Discharge status: Home / Self Care | Payer: Medicare Other | Source: Ambulatory Visit | Attending: Hematology and Oncology | Admitting: Hematology and Oncology

## 2019-06-15 VITALS — BP 147/92 | HR 77 | Temp 98.0°F | Resp 16

## 2019-06-15 DIAGNOSIS — C50411 Malignant neoplasm of upper-outer quadrant of right female breast: Secondary | ICD-10-CM

## 2019-06-15 DIAGNOSIS — Z95828 Presence of other vascular implants and grafts: Secondary | ICD-10-CM

## 2019-06-15 DIAGNOSIS — Z5112 Encounter for antineoplastic immunotherapy: Secondary | ICD-10-CM | POA: Diagnosis not present

## 2019-06-15 HISTORY — DX: Personal history of irradiation: Z92.3

## 2019-06-15 HISTORY — DX: Personal history of antineoplastic chemotherapy: Z92.21

## 2019-06-15 MED ORDER — ACETAMINOPHEN 325 MG PO TABS
650.0000 mg | ORAL_TABLET | Freq: Once | ORAL | Status: AC
  Administered 2019-06-15: 650 mg via ORAL

## 2019-06-15 MED ORDER — HEPARIN SOD (PORK) LOCK FLUSH 100 UNIT/ML IV SOLN
500.0000 [IU] | Freq: Once | INTRAVENOUS | Status: AC | PRN
  Administered 2019-06-15: 500 [IU]
  Filled 2019-06-15: qty 5

## 2019-06-15 MED ORDER — SODIUM CHLORIDE 0.9 % IV SOLN
Freq: Once | INTRAVENOUS | Status: AC
  Filled 2019-06-15: qty 250

## 2019-06-15 MED ORDER — SODIUM CHLORIDE 0.9 % IV SOLN
200.0000 mg | Freq: Once | INTRAVENOUS | Status: AC
  Administered 2019-06-15: 14:00:00 200 mg via INTRAVENOUS
  Filled 2019-06-15: qty 200

## 2019-06-15 MED ORDER — DIPHENHYDRAMINE HCL 25 MG PO CAPS
50.0000 mg | ORAL_CAPSULE | Freq: Once | ORAL | Status: AC
  Administered 2019-06-15: 13:00:00 50 mg via ORAL

## 2019-06-15 MED ORDER — ACETAMINOPHEN 325 MG PO TABS
ORAL_TABLET | ORAL | Status: AC
  Filled 2019-06-15: qty 2

## 2019-06-15 MED ORDER — SODIUM CHLORIDE 0.9% FLUSH
10.0000 mL | Freq: Once | INTRAVENOUS | Status: AC | PRN
  Administered 2019-06-15: 15:00:00 10 mL
  Filled 2019-06-15: qty 10

## 2019-06-15 MED ORDER — DIPHENHYDRAMINE HCL 25 MG PO CAPS
ORAL_CAPSULE | ORAL | Status: AC
  Filled 2019-06-15: qty 2

## 2019-06-15 NOTE — Patient Instructions (Signed)

## 2019-06-20 ENCOUNTER — Inpatient Hospital Stay: Payer: Medicare Other

## 2019-06-20 ENCOUNTER — Other Ambulatory Visit: Payer: Self-pay | Admitting: Hematology and Oncology

## 2019-06-20 ENCOUNTER — Other Ambulatory Visit: Payer: Self-pay

## 2019-06-20 VITALS — BP 165/67 | HR 70 | Temp 98.0°F | Resp 17

## 2019-06-20 DIAGNOSIS — Z170 Estrogen receptor positive status [ER+]: Secondary | ICD-10-CM

## 2019-06-20 DIAGNOSIS — C50411 Malignant neoplasm of upper-outer quadrant of right female breast: Secondary | ICD-10-CM

## 2019-06-20 DIAGNOSIS — Z5112 Encounter for antineoplastic immunotherapy: Secondary | ICD-10-CM | POA: Diagnosis not present

## 2019-06-20 MED ORDER — DIPHENHYDRAMINE HCL 25 MG PO CAPS
50.0000 mg | ORAL_CAPSULE | Freq: Once | ORAL | Status: AC
  Administered 2019-06-20: 50 mg via ORAL

## 2019-06-20 MED ORDER — ACETAMINOPHEN 325 MG PO TABS
650.0000 mg | ORAL_TABLET | Freq: Once | ORAL | Status: AC
  Administered 2019-06-20: 650 mg via ORAL

## 2019-06-20 MED ORDER — SODIUM CHLORIDE 0.9 % IV SOLN
Freq: Once | INTRAVENOUS | Status: AC
  Filled 2019-06-20: qty 250

## 2019-06-20 MED ORDER — SODIUM CHLORIDE 0.9% FLUSH
10.0000 mL | INTRAVENOUS | Status: DC | PRN
  Administered 2019-06-20: 10 mL
  Filled 2019-06-20: qty 10

## 2019-06-20 MED ORDER — TRASTUZUMAB-DKST CHEMO 150 MG IV SOLR
450.0000 mg | Freq: Once | INTRAVENOUS | Status: AC
  Administered 2019-06-20: 450 mg via INTRAVENOUS
  Filled 2019-06-20: qty 21.43

## 2019-06-20 MED ORDER — HEPARIN SOD (PORK) LOCK FLUSH 100 UNIT/ML IV SOLN
500.0000 [IU] | Freq: Once | INTRAVENOUS | Status: AC | PRN
  Administered 2019-06-20: 500 [IU]
  Filled 2019-06-20: qty 5

## 2019-06-20 MED ORDER — DIPHENHYDRAMINE HCL 25 MG PO CAPS
ORAL_CAPSULE | ORAL | Status: AC
  Filled 2019-06-20: qty 2

## 2019-06-20 MED ORDER — ACETAMINOPHEN 325 MG PO TABS
ORAL_TABLET | ORAL | Status: AC
  Filled 2019-06-20: qty 2

## 2019-06-20 NOTE — Patient Instructions (Signed)
[facility] Center Discharge Instructions for Patients Receiving Chemotherapy  Today you received the following chemotherapy agents trastuzumab.  To help prevent nausea and vomiting after your treatment, we encourage you to take your nausea medication as directed.    If you develop nausea and vomiting that is not controlled by your nausea medication, call the clinic.   BELOW ARE SYMPTOMS THAT SHOULD BE REPORTED IMMEDIATELY:  *FEVER GREATER THAN 100.5 F  *CHILLS WITH OR WITHOUT FEVER  NAUSEA AND VOMITING THAT IS NOT CONTROLLED WITH YOUR NAUSEA MEDICATION  *UNUSUAL SHORTNESS OF BREATH  *UNUSUAL BRUISING OR BLEEDING  TENDERNESS IN MOUTH AND THROAT WITH OR WITHOUT PRESENCE OF ULCERS  *URINARY PROBLEMS  *BOWEL PROBLEMS  UNUSUAL RASH Items with * indicate a potential emergency and should be followed up as soon as possible.  Feel free to call the clinic should you have any questions or concerns. The clinic phone number is [phone].  Please show the CHEMO ALERT CARD at check-in to the Emergency Department and triage nurse.   

## 2019-07-05 NOTE — Progress Notes (Signed)
Pharmacist Chemotherapy Monitoring - Follow Up Assessment    I verify that I have reviewed each item in the below checklist:  . Regimen for the patient is scheduled for the appropriate day and plan matches scheduled date. Marland Kitchen Appropriate non-routine labs are ordered dependent on drug ordered. . If applicable, additional medications reviewed and ordered per protocol based on lifetime cumulative doses and/or treatment regimen.   Plan for follow-up and/or issues identified: No . I-vent associated with next due treatment: No . MD and/or nursing notified: No  Serge Main K 07/05/2019 9:05 AM

## 2019-07-11 ENCOUNTER — Encounter: Payer: Self-pay | Admitting: Adult Health

## 2019-07-11 ENCOUNTER — Other Ambulatory Visit: Payer: Self-pay

## 2019-07-11 ENCOUNTER — Inpatient Hospital Stay (HOSPITAL_BASED_OUTPATIENT_CLINIC_OR_DEPARTMENT_OTHER): Payer: Medicare Other | Admitting: Adult Health

## 2019-07-11 ENCOUNTER — Telehealth: Payer: Self-pay | Admitting: Adult Health

## 2019-07-11 ENCOUNTER — Inpatient Hospital Stay: Payer: Medicare Other

## 2019-07-11 VITALS — BP 152/68

## 2019-07-11 VITALS — BP 187/88 | HR 64 | Temp 98.2°F | Resp 18 | Ht 69.0 in | Wt 180.6 lb

## 2019-07-11 DIAGNOSIS — Z170 Estrogen receptor positive status [ER+]: Secondary | ICD-10-CM

## 2019-07-11 DIAGNOSIS — C50411 Malignant neoplasm of upper-outer quadrant of right female breast: Secondary | ICD-10-CM | POA: Diagnosis not present

## 2019-07-11 DIAGNOSIS — Z95828 Presence of other vascular implants and grafts: Secondary | ICD-10-CM

## 2019-07-11 DIAGNOSIS — E2839 Other primary ovarian failure: Secondary | ICD-10-CM

## 2019-07-11 DIAGNOSIS — Z5112 Encounter for antineoplastic immunotherapy: Secondary | ICD-10-CM | POA: Diagnosis not present

## 2019-07-11 LAB — CMP (CANCER CENTER ONLY)
ALT: 11 U/L (ref 0–44)
AST: 14 U/L — ABNORMAL LOW (ref 15–41)
Albumin: 3.7 g/dL (ref 3.5–5.0)
Alkaline Phosphatase: 68 U/L (ref 38–126)
Anion gap: 9 (ref 5–15)
BUN: 12 mg/dL (ref 8–23)
CO2: 29 mmol/L (ref 22–32)
Calcium: 9.2 mg/dL (ref 8.9–10.3)
Chloride: 103 mmol/L (ref 98–111)
Creatinine: 0.66 mg/dL (ref 0.44–1.00)
GFR, Est AFR Am: >60 mL/min (ref >60)
GFR, Estimated: >60 mL/min (ref >60)
Glucose, Bld: 134 mg/dL — ABNORMAL HIGH (ref 70–99)
Potassium: 4.1 mmol/L (ref 3.5–5.1)
Sodium: 141 mmol/L (ref 135–145)
Total Bilirubin: <0.2 mg/dL — ABNORMAL LOW (ref 0.3–1.2)
Total Protein: 6.6 g/dL (ref 6.5–8.1)

## 2019-07-11 LAB — CBC WITH DIFFERENTIAL (CANCER CENTER ONLY)
Abs Immature Granulocytes: 0.01 10*3/uL (ref 0.00–0.07)
Basophils Absolute: 0 10*3/uL (ref 0.0–0.1)
Basophils Relative: 1 %
Eosinophils Absolute: 0.1 10*3/uL (ref 0.0–0.5)
Eosinophils Relative: 2 %
HCT: 37.7 % (ref 36.0–46.0)
Hemoglobin: 11.2 g/dL — ABNORMAL LOW (ref 12.0–15.0)
Immature Granulocytes: 0 %
Lymphocytes Relative: 20 %
Lymphs Abs: 1 10*3/uL (ref 0.7–4.0)
MCH: 25.6 pg — ABNORMAL LOW (ref 26.0–34.0)
MCHC: 29.7 g/dL — ABNORMAL LOW (ref 30.0–36.0)
MCV: 86.1 fL (ref 80.0–100.0)
Monocytes Absolute: 0.5 10*3/uL (ref 0.1–1.0)
Monocytes Relative: 9 %
Neutro Abs: 3.4 10*3/uL (ref 1.7–7.7)
Neutrophils Relative %: 68 %
Platelet Count: 172 10*3/uL (ref 150–400)
RBC: 4.38 MIL/uL (ref 3.87–5.11)
RDW: 19.3 % — ABNORMAL HIGH (ref 11.5–15.5)
WBC Count: 5 10*3/uL (ref 4.0–10.5)
nRBC: 0 % (ref 0.0–0.2)

## 2019-07-11 MED ORDER — HEPARIN SOD (PORK) LOCK FLUSH 100 UNIT/ML IV SOLN
500.0000 [IU] | Freq: Once | INTRAVENOUS | Status: AC | PRN
  Administered 2019-07-11: 500 [IU]
  Filled 2019-07-11: qty 5

## 2019-07-11 MED ORDER — SODIUM CHLORIDE 0.9% FLUSH
10.0000 mL | Freq: Once | INTRAVENOUS | Status: AC
  Administered 2019-07-11: 10 mL
  Filled 2019-07-11: qty 10

## 2019-07-11 MED ORDER — ACETAMINOPHEN 325 MG PO TABS
ORAL_TABLET | ORAL | Status: AC
  Filled 2019-07-11: qty 2

## 2019-07-11 MED ORDER — DIPHENHYDRAMINE HCL 25 MG PO CAPS
ORAL_CAPSULE | ORAL | Status: AC
  Filled 2019-07-11: qty 2

## 2019-07-11 MED ORDER — DEXAMETHASONE SODIUM PHOSPHATE 10 MG/ML IJ SOLN
INTRAMUSCULAR | Status: AC
  Filled 2019-07-11: qty 1

## 2019-07-11 MED ORDER — TRASTUZUMAB-DKST CHEMO 150 MG IV SOLR
450.0000 mg | Freq: Once | INTRAVENOUS | Status: AC
  Administered 2019-07-11: 450 mg via INTRAVENOUS
  Filled 2019-07-11: qty 21.43

## 2019-07-11 MED ORDER — SODIUM CHLORIDE 0.9% FLUSH
10.0000 mL | INTRAVENOUS | Status: DC | PRN
  Administered 2019-07-11: 10 mL
  Filled 2019-07-11: qty 10

## 2019-07-11 MED ORDER — HEPARIN SOD (PORK) LOCK FLUSH 100 UNIT/ML IV SOLN
500.0000 [IU] | Freq: Once | INTRAVENOUS | Status: DC
  Filled 2019-07-11: qty 5

## 2019-07-11 MED ORDER — DIPHENHYDRAMINE HCL 25 MG PO CAPS
50.0000 mg | ORAL_CAPSULE | Freq: Once | ORAL | Status: AC
  Administered 2019-07-11: 50 mg via ORAL

## 2019-07-11 MED ORDER — SODIUM CHLORIDE 0.9 % IV SOLN
Freq: Once | INTRAVENOUS | Status: AC
  Filled 2019-07-11: qty 250

## 2019-07-11 MED ORDER — ACETAMINOPHEN 325 MG PO TABS
650.0000 mg | ORAL_TABLET | Freq: Once | ORAL | Status: AC
  Administered 2019-07-11: 650 mg via ORAL

## 2019-07-11 NOTE — Telephone Encounter (Signed)
Changed 4/21 appt per 3/31 los. Left voicemail with new appt details.

## 2019-07-11 NOTE — Progress Notes (Signed)
[facility] Follow up:    Sabrina [name]., MD [address] [place] [address]   DIAGNOSIS: Cancer Staging Malignant neoplasm of upper-outer quadrant of right breast in female, estrogen receptor positive ([place]) Staging form: Breast, AJCC 8th Edition - Clinical stage from 06/28/2018: Stage IA (cT1b, cN0, cM0, G1, ER+, PR+, HER2+) - Signed by Sabrina Lose, MD on 06/28/2018   SUMMARY OF ONCOLOGIC HISTORY: Oncology History  Malignant neoplasm of upper-outer quadrant of right breast in female, estrogen receptor positive ([place])  06/19/2018 Initial Diagnosis   Screening mammogram detected right breast mass at 9 o'clock position 3 cm from the nipple, ultrasound revealed 7 x 5 x 5 mm mass, axillary ultrasound normal-appearing lymph nodes, ultrasound biopsy: Grade 1 IDC ER 100%, PR 10%, Ki-67 10%, HER-2 3+ positive, T1b N0 stage Ia   06/28/2018 Cancer Staging   Staging form: Breast, AJCC 8th Edition - Clinical stage from 06/28/2018: Stage IA (cT1b, cN0, cM0, G1, ER+, PR+, HER2+) - Signed by Sabrina Lose, MD on 06/28/2018   07/05/2018 Genetic Testing   Negative genetic testing on the common hereditary cancer panel.  The Hereditary Gene Panel offered by [facility] includes sequencing and/or deletion duplication testing of the following 48 genes: APC, ATM, AXIN2, BARD1, BMPR1A, BRCA1, BRCA2, BRIP1, CDH1, CDK4, CDKN2A (p14ARF), CDKN2A (p16INK4a), CHEK2, CTNNA1, DICER1, EPCAM (Deletion/duplication testing only), GREM1 (promoter region deletion/duplication testing only), KIT, MEN1, MLH1, MSH2, MSH3, MSH6, MUTYH, NBN, NF1, NHTL1, PALB2, PDGFRA, PMS2, POLD1, POLE, PTEN, RAD50, RAD51C, RAD51D, RNF43, SDHB, SDHC, SDHD, SMAD4, SMARCA4. STK11, TP53, TSC1, TSC2, and VHL.  The following genes were evaluated for sequence changes only: SDHA and HOXB13 c.251G>A variant only. The report date is July 05, 2018.   07/13/2018 Surgery   Right lumpectomy ([facility]): IDC with DCIS, 1.0cm, grade 2, ER+ (100%),  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    08/17/2018 -  Chemotherapy   The patient had trastuzumab (HERCEPTIN) 300 mg in sodium chloride 0.9 % 250 mL chemo infusion, 315 mg, Intravenous,  Once, 3 of 3 cycles Dose modification: 6 mg/kg (original dose 6 mg/kg, Cycle 3, Reason: Other (see comments), Comment: changing to q3wk dosing) Administration: 300 mg (08/17/2018), 150 mg (09/14/2018), 150 mg (08/31/2018), 150 mg (09/07/2018), 150 mg (09/21/2018), 168 mg (09/28/2018), 168 mg (10/05/2018), 168 mg (10/12/2018), 168 mg (10/19/2018), 168 mg (10/27/2018), 150 mg (08/24/2018), 450 mg (11/02/2018) PACLitaxel (TAXOL) 162 mg in sodium chloride 0.9 % 250 mL chemo infusion (</= 4m/m2), 80 mg/m2 = 162 mg, Intravenous,  Once, 3 of 3 cycles Administration: 162 mg (08/17/2018), 162 mg (08/24/2018), 162 mg (09/14/2018), 162 mg (08/31/2018), 162 mg (09/07/2018), 162 mg (09/21/2018), 162 mg (09/28/2018), 162 mg (10/05/2018), 162 mg (10/12/2018), 162 mg (10/19/2018), 162 mg (10/27/2018), 162 mg (11/02/2018) trastuzumab-dkst (OGIVRI) 450 mg in sodium chloride 0.9 % 250 mL chemo infusion, 450 mg (100 % of original dose 450 mg), Intravenous,  Once, 11 of 13 cycles Dose modification: 450 mg (original dose 450 mg, Cycle 4, Reason: Other (see comments), Comment: Biosimilar Conversion) Administration: 450 mg (11/23/2018), 450 mg (12/14/2018), 450 mg (01/04/2019), 450 mg (01/25/2019), 450 mg (02/15/2019), 450 mg (03/07/2019), 450 mg (03/29/2019), 450 mg (04/18/2019), 450 mg (05/09/2019), 450 mg (05/30/2019), 450 mg (06/20/2019)  for chemotherapy treatment.    12/05/2018 - 01/02/2019 Radiation Therapy   Adjuvant XRT   01/25/2019 -  Anti-estrogen oral therapy   Anastrozole 179mdaily, plan 5-7 years     CURRENT THERAPY: Herceptin and Anastrozole  INTERVAL HISTORY: Sabrina [name] 69 y.o. female returns for evaluation prior to receiving Herceptin.  She is tolerating this well.  She is taking Anastrozole daily and denies any arthralgias, hot  flashes, vaginal dryness.  She wants to know when she can have her port removed.  Her blood pressure is elevated today and she notes it is frequently elevated when she is here for treatment.  She denies any headaches, chest pain, or other concerns today.   Her most recent echocardiogram was on 04/17/2019 and showed an EF of 60-65%.  She also underwent bilateral diagnostic mammogram on 06/15/2019 that showed no evidence of malignancy and breast density category C.  Patient Active Problem List   Diagnosis Date Noted  . Port-A-Cath in place 08/24/2018  . Genetic testing 07/05/2018  . Family history of breast cancer   . Family history of prostate cancer   . Family history of leukemia   . Malignant neoplasm of upper-outer quadrant of right breast in female, estrogen receptor positive ([place]) 06/26/2018  . Diabetes mellitus ([place]) 06/12/2018  . Smoking 07/12/2017  . Statin intolerance 12/08/2016  . Atrial fibrillation ([place]) [I48.91] 11/08/2016  . Long term (current) use of anticoagulants [Z79.01] 11/08/2016  . Encounter for therapeutic drug monitoring 11/08/2016  . Moderate episode of recurrent major depressive disorder ([place]) 12/01/2015  . Benign essential hypertension 08/25/2015  . Hypothyroidism due to Hashimoto's thyroiditis 05/23/2015  . Type 2 diabetes mellitus without complication, without long-term current use of insulin ([place]) 05/23/2015  . Dyslipidemia 02/13/2015    is allergic to atorvastatin and meperidine.  MEDICAL HISTORY: Past Medical History:  Diagnosis Date  . Atrial fibrillation ([place]) [I48.91] 11/08/2016  . Benign essential hypertension 08/25/2015  . Diabetes ([place])   . Family history of breast cancer   . Family history of leukemia   . Family history of prostate cancer   . Hyperlipidemia   . Hypothyroidism due to Hashimoto's thyroiditis 05/23/2015  . Personal history of chemotherapy   . Personal history of radiation therapy     SURGICAL HISTORY: Past Surgical History:   Procedure Laterality Date  . ABDOMINAL HYSTERECTOMY    . APPENDECTOMY    . BREAST LUMPECTOMY Right 07/13/2018  . BREAST LUMPECTOMY WITH RADIOACTIVE SEED AND SENTINEL LYMPH NODE BIOPSY Right 07/13/2018   Procedure: RIGHT BREAST LUMPECTOMY WITH RADIOACTIVE SEED AND RIGHT SENTINEL LYMPH NODE MAPPING;  Surgeon: Erroll Luna, MD;  Location: [facility];  Service: General;  Laterality: Right;  . BREAST SURGERY    . CHOLECYSTECTOMY    . KNEE SURGERY    . PORTACATH PLACEMENT Right 08/02/2018   Procedure: INSERTION PORT-A-CATH WITH ULTRASOUND;  Surgeon: Erroll Luna, MD;  Location: [facility];  Service: General;  Laterality: Right;    SOCIAL HISTORY: Social History   Socioeconomic History  . Marital status: Widowed    Spouse name: Not on file  . Number of children: Not on file  . Years of education: Not on file  . Highest education level: Not on file  Occupational History  . Not on file  Tobacco Use  . Smoking status: Current Every Day Smoker    Packs/day: 2.00    Years: 40.00    Pack years: 80.00  . Smokeless tobacco: Never Used  Substance and Sexual Activity  . Alcohol use: Never  . Drug use: Never  . Sexual activity: Not on file  Other Topics Concern  . Not on file  Social History Narrative  . Not on file   Social  Determinants of Health   Financial Resource Strain:   . Difficulty of Paying Living Expenses:   Food Insecurity:   . Worried About Charity fundraiser in the Last Year:   . Arboriculturist in the Last Year:   Transportation Needs: No Transportation Needs  . Lack of Transportation (Medical): No  . Lack of Transportation (Non-Medical): No  Physical Activity:   . Days of Exercise per Week:   . Minutes of Exercise per Session:   Stress:   . Feeling of Stress :   Social Connections:   . Frequency of Communication with Friends and Family:   . Frequency of Social Gatherings with Friends and Family:   . Attends Religious Services:    . Active Member of Clubs or Organizations:   . Attends Archivist Meetings:   Marland Kitchen Marital Status:   Intimate Partner Violence: Not At Risk  . Fear of Current or Ex-Partner: No  . Emotionally Abused: No  . Physically Abused: No  . Sexually Abused: No    FAMILY HISTORY: Family History  Problem Relation Age of Onset  . Prostate cancer Father   . Cancer Brother   . Acute myelogenous leukemia Brother   . Alcohol abuse Maternal Aunt   . Hypertension Maternal Aunt   . Breast cancer Maternal Aunt        dx over 24  . Alcohol abuse Paternal Uncle   . Diabetes Paternal Uncle   . Hypertension Maternal Grandmother   . Breast cancer Mother 31       Deceased at 47  . Cancer Maternal Uncle   . Diabetes Maternal Uncle   . Alcohol abuse Maternal Uncle   . Diabetes Son   . Breast cancer Cousin        dx over 65  . Breast cancer Cousin 40    Review of Systems  Constitutional: Negative for appetite change, chills, fatigue, fever and unexpected weight change.  HENT:   Negative for hearing loss and lump/mass.   Eyes: Negative for eye problems and icterus.  Respiratory: Negative for chest tightness, cough and shortness of breath.   Cardiovascular: Negative for chest pain, leg swelling and palpitations.  Gastrointestinal: Negative for abdominal distention, abdominal pain, constipation, diarrhea, nausea and vomiting.  Endocrine: Negative for hot flashes.  Genitourinary: Negative for difficulty urinating.   Musculoskeletal: Negative for arthralgias.  Skin: Negative for itching and rash.  Neurological: Negative for dizziness, extremity weakness, headaches and numbness.  Hematological: Negative for adenopathy. Does not bruise/bleed easily.  Psychiatric/Behavioral: Negative for depression. The patient is not nervous/anxious.       PHYSICAL EXAMINATION  ECOG PERFORMANCE STATUS: 0  Vitals:   07/11/19 0855  BP: (!) 187/88  Pulse: 64  Resp: 18  Temp: 98.2 F (36.8 C)  SpO2:  97%    Physical Exam Constitutional:      Appearance: Normal appearance.  HENT:     Head: Normocephalic and atraumatic.  Eyes:     General: No scleral icterus. Cardiovascular:     Rate and Rhythm: Normal rate and regular rhythm.     Pulses: Normal pulses.     Heart sounds: Normal heart sounds.  Pulmonary:     Effort: Pulmonary effort is normal.     Breath sounds: Normal breath sounds.  Abdominal:     General: Abdomen is flat. Bowel sounds are normal.     Palpations: Abdomen is soft.  Musculoskeletal:        General: No  swelling.     Cervical back: Neck supple.  Skin:    General: Skin is warm and dry.     Capillary Refill: Capillary refill takes less than 2 seconds.     Findings: No rash.  Neurological:     General: No focal deficit present.     Mental Status: She is alert.  Psychiatric:        Mood and Affect: Mood normal.        Behavior: Behavior normal.     LABORATORY DATA:  CBC    Component Value Date/Time   WBC 5.0 07/11/2019 0839   WBC 8.8 08/01/2018 1440   RBC 4.38 07/11/2019 0839   HGB 11.2 (L) 07/11/2019 0839   HCT 37.7 07/11/2019 0839   PLT 172 07/11/2019 0839   MCV 86.1 07/11/2019 0839   MCH 25.6 (L) 07/11/2019 0839   MCHC 29.7 (L) 07/11/2019 0839   RDW 19.3 (H) 07/11/2019 0839   LYMPHSABS 1.0 07/11/2019 0839   MONOABS 0.5 07/11/2019 0839   EOSABS 0.1 07/11/2019 0839   BASOSABS 0.0 07/11/2019 0839    CMP     Component Value Date/Time   NA 141 07/11/2019 0839   K 4.1 07/11/2019 0839   CL 103 07/11/2019 0839   CO2 29 07/11/2019 0839   GLUCOSE 134 (H) 07/11/2019 0839   BUN 12 07/11/2019 0839   CREATININE 0.66 07/11/2019 0839   CALCIUM 9.2 07/11/2019 0839   PROT 6.6 07/11/2019 0839   ALBUMIN 3.7 07/11/2019 0839   AST 14 (L) 07/11/2019 0839   ALT 11 07/11/2019 0839   ALKPHOS 68 07/11/2019 0839   BILITOT <0.2 (L) 07/11/2019 0839   GFRNONAA >60 07/11/2019 0839   GFRAA >60 07/11/2019 0839      ASSESSMENT and THERAPY PLAN:    Malignant neoplasm of upper-outer quadrant of right breast in female, estrogen receptor positive ([place]) 07/13/2018:Right lumpectomy ([facility]): IDC with DCIS, 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  Treatment plan: 1.Adj chemo with Taxol-Herceptin weekly X 12completed 7/23/2020and then q 3 weeks Herceptin. 2.Adjuvant radiation therapystarted 12/05/2018-01/02/19 3.Followed by adjuvant antiestrogen therapy with Anastrozole in 01/2019 ----------------------------------------------------------------------------------------------------------------------------------------------- Current treatment: Herceptin maintenance every 3 weeksuntil 08/01/2019 Monitoring closely for toxicities  Vonna is tolerating her Herceptin and anastrozole well.  She will continue taking anastrozole daily and receive the herceptin every three weeks.  She will finish this on 4/21.    I placed orders for her to undergo bone density testing at the breast center since it has been several years since her last test.  We also discussed bone health including calcium, vitamin D and weight bearing exercises today.    She is due for echo and I placed orders for this to be completed within the next few weeks.  She also needs a SCP visit.  She wants to do this prior to her next treatment.    Shaquille will return in 3 weeks for f/u and her final Herceptin.  She was recommended to continue with the appropriate pandemic precautions. She knows to call for any questions that may arise between now and her next appointment.  We are happy to see her sooner if needed.    Orders Placed This Encounter  Procedures  . DG Bone Density    Standing Status:   Future    Standing Expiration Date:   09/09/2020    Order Specific Question:   Reason for Exam (SYMPTOM  OR DIAGNOSIS REQUIRED)  Answer:   estrogen deficiency    Order Specific Question:   Preferred imaging location?    Answer:    [facility]  . ECHOCARDIOGRAM COMPLETE    Standing Status:   Future    Standing Expiration Date:   10/09/2020    Order Specific Question:   Where should this test be performed    Answer:   [facility]    Order Specific Question:   Perflutren DEFINITY (image enhancing agent) should be administered unless hypersensitivity or allergy exist    Answer:   Administer Perflutren    Order Specific Question:   Reason for exam-Echo    Answer:   Chemotherapy evaluation  v87.41 / v58.11   Total encounter time: 30 minutes*  Wilber Bihari, NP 07/11/19 9:59 AM Medical Oncology and Hematology [facility] [place], [place] [address] Tel. [phone]    Fax. [phone]  *Total Encounter Time as defined by the Centers for Medicare and Medicaid Services includes, in addition to the face-to-face time of a patient visit (documented in the note above) non-face-to-face time: obtaining and reviewing outside history, ordering and reviewing medications, tests or procedures, care coordination (communications with other health care professionals or caregivers) and documentation in the medical record.

## 2019-07-11 NOTE — Patient Instructions (Signed)
[facility] °Discharge Instructions for Patients Receiving Chemotherapy ° °Today you received the following chemotherapy agents Trastuzumab ° °To help prevent nausea and vomiting after your treatment, we encourage you to take your nausea medication as directed. °  °If you develop nausea and vomiting that is not controlled by your nausea medication, call the clinic.  ° °BELOW ARE SYMPTOMS THAT SHOULD BE REPORTED IMMEDIATELY: °· *FEVER GREATER THAN 100.5 F °· *CHILLS WITH OR WITHOUT FEVER °· NAUSEA AND VOMITING THAT IS NOT CONTROLLED WITH YOUR NAUSEA MEDICATION °· *UNUSUAL SHORTNESS OF BREATH °· *UNUSUAL BRUISING OR BLEEDING °· TENDERNESS IN MOUTH AND THROAT WITH OR WITHOUT PRESENCE OF ULCERS °· *URINARY PROBLEMS °· *BOWEL PROBLEMS °· UNUSUAL RASH °Items with * indicate a potential emergency and should be followed up as soon as possible. ° °Feel free to call the clinic should you have any questions or concerns. The clinic phone number is [phone]. ° °Please show the CHEMO ALERT CARD at check-in to the Emergency Department and triage nurse. ° ° °

## 2019-07-11 NOTE — Assessment & Plan Note (Signed)
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  Treatment plan: 1.Adj chemo with Taxol-Herceptin weekly X 12completed 7/23/2020and then q 3 weeks Herceptin. 2.Adjuvant radiation therapystarted 12/05/2018-01/02/19 3.Followed by adjuvant antiestrogen therapy with Anastrozole in 01/2019 ----------------------------------------------------------------------------------------------------------------------------------------------- Current treatment: Herceptin maintenance every 3 weeksuntil 08/01/2019 Monitoring closely for toxicities  Sabrina [name] is tolerating her Herceptin and anastrozole well.  She will continue taking anastrozole daily and receive the herceptin every three weeks.  She will finish this on 4/21.    I placed orders for her to undergo bone density testing at the breast center since it has been several years since her last test.  We also discussed bone health including calcium, vitamin D and weight bearing exercises today.    She is due for echo and I placed orders for this to be completed within the next few weeks.  She also needs a SCP visit.  She wants to do this prior to her next treatment.    [name] will return in 3 weeks for f/u and her final Herceptin.  She was recommended to continue with the appropriate pandemic precautions. She knows to call for any questions that may arise between now and her next appointment.  We are happy to see her sooner if needed.

## 2019-07-13 ENCOUNTER — Other Ambulatory Visit: Payer: Self-pay | Admitting: Adult Health

## 2019-07-13 DIAGNOSIS — E2839 Other primary ovarian failure: Secondary | ICD-10-CM

## 2019-07-18 ENCOUNTER — Ambulatory Visit (HOSPITAL_COMMUNITY)
Admission: RE | Admit: 2019-07-18 | Discharge: 2019-07-18 | Disposition: A | Discharge status: Home / Self Care | Payer: Medicare Other | Source: Ambulatory Visit | Attending: Adult Health | Admitting: Adult Health

## 2019-07-18 ENCOUNTER — Other Ambulatory Visit: Payer: Self-pay

## 2019-07-18 DIAGNOSIS — C50411 Malignant neoplasm of upper-outer quadrant of right female breast: Secondary | ICD-10-CM | POA: Insufficient documentation

## 2019-07-18 DIAGNOSIS — Z79899 Other long term (current) drug therapy: Secondary | ICD-10-CM | POA: Diagnosis not present

## 2019-07-18 DIAGNOSIS — F17200 Nicotine dependence, unspecified, uncomplicated: Secondary | ICD-10-CM | POA: Diagnosis not present

## 2019-07-18 DIAGNOSIS — E785 Hyperlipidemia, unspecified: Secondary | ICD-10-CM | POA: Insufficient documentation

## 2019-07-18 DIAGNOSIS — I4891 Unspecified atrial fibrillation: Secondary | ICD-10-CM | POA: Insufficient documentation

## 2019-07-18 DIAGNOSIS — I119 Hypertensive heart disease without heart failure: Secondary | ICD-10-CM | POA: Diagnosis not present

## 2019-07-18 DIAGNOSIS — E119 Type 2 diabetes mellitus without complications: Secondary | ICD-10-CM | POA: Diagnosis not present

## 2019-07-18 DIAGNOSIS — Z5181 Encounter for therapeutic drug level monitoring: Secondary | ICD-10-CM | POA: Diagnosis not present

## 2019-07-18 DIAGNOSIS — I358 Other nonrheumatic aortic valve disorders: Secondary | ICD-10-CM | POA: Insufficient documentation

## 2019-07-18 DIAGNOSIS — Z170 Estrogen receptor positive status [ER+]: Secondary | ICD-10-CM | POA: Diagnosis not present

## 2019-07-18 NOTE — Progress Notes (Signed)
*  PRELIMINARY RESULTS* Echocardiogram 2D Echocardiogram has been performed.  Sabrina [name] 07/18/2019, 11:31 AM

## 2019-07-24 ENCOUNTER — Ambulatory Visit (INDEPENDENT_AMBULATORY_CARE_PROVIDER_SITE_OTHER): Payer: Medicare Other | Admitting: Pharmacist

## 2019-07-24 ENCOUNTER — Other Ambulatory Visit: Payer: Self-pay

## 2019-07-24 DIAGNOSIS — I480 Paroxysmal atrial fibrillation: Secondary | ICD-10-CM

## 2019-07-24 DIAGNOSIS — Z7901 Long term (current) use of anticoagulants: Secondary | ICD-10-CM | POA: Diagnosis not present

## 2019-07-24 DIAGNOSIS — Z5181 Encounter for therapeutic drug level monitoring: Secondary | ICD-10-CM

## 2019-07-24 LAB — POCT INR: INR: 1.9 — AB (ref 2.0–3.0)

## 2019-07-24 NOTE — Patient Instructions (Signed)
Take 1.5 tablets today then continue 10mg  daily except 5mg  on Mondays, Wednesdays and Saturdays. Continue eating 2 servings of leafy green vegetables each week. Recheck INR in 8 weeks.

## 2019-07-26 NOTE — Progress Notes (Signed)
Pharmacist Chemotherapy Monitoring - Follow Up Assessment    I verify that I have reviewed each item in the below checklist:  . Regimen for the patient is scheduled for the appropriate day and plan matches scheduled date. Marland Kitchen Appropriate non-routine labs are ordered dependent on drug ordered. . If applicable, additional medications reviewed and ordered per protocol based on lifetime cumulative doses and/or treatment regimen.   Plan for follow-up and/or issues identified: No . I-vent associated with next due treatment: No . MD and/or nursing notified: No  Sabrina [name] K 07/26/2019 8:51 AM

## 2019-08-01 ENCOUNTER — Encounter: Payer: Self-pay | Admitting: *Deleted

## 2019-08-01 ENCOUNTER — Inpatient Hospital Stay: Payer: Medicare Other

## 2019-08-01 ENCOUNTER — Other Ambulatory Visit: Payer: Self-pay

## 2019-08-01 ENCOUNTER — Inpatient Hospital Stay: Payer: Medicare Other | Attending: Adult Health | Admitting: Adult Health

## 2019-08-01 ENCOUNTER — Encounter: Payer: Self-pay | Admitting: Adult Health

## 2019-08-01 ENCOUNTER — Ambulatory Visit: Payer: Medicare Other

## 2019-08-01 VITALS — BP 160/77 | HR 70 | Temp 98.2°F | Resp 17 | Ht 69.0 in | Wt 178.8 lb

## 2019-08-01 DIAGNOSIS — Z9221 Personal history of antineoplastic chemotherapy: Secondary | ICD-10-CM | POA: Insufficient documentation

## 2019-08-01 DIAGNOSIS — C50411 Malignant neoplasm of upper-outer quadrant of right female breast: Secondary | ICD-10-CM | POA: Diagnosis present

## 2019-08-01 DIAGNOSIS — Z803 Family history of malignant neoplasm of breast: Secondary | ICD-10-CM | POA: Diagnosis not present

## 2019-08-01 DIAGNOSIS — Z8349 Family history of other endocrine, nutritional and metabolic diseases: Secondary | ICD-10-CM | POA: Insufficient documentation

## 2019-08-01 DIAGNOSIS — Z170 Estrogen receptor positive status [ER+]: Secondary | ICD-10-CM | POA: Insufficient documentation

## 2019-08-01 DIAGNOSIS — I4891 Unspecified atrial fibrillation: Secondary | ICD-10-CM | POA: Diagnosis not present

## 2019-08-01 DIAGNOSIS — Z7984 Long term (current) use of oral hypoglycemic drugs: Secondary | ICD-10-CM | POA: Insufficient documentation

## 2019-08-01 DIAGNOSIS — Z806 Family history of leukemia: Secondary | ICD-10-CM | POA: Insufficient documentation

## 2019-08-01 DIAGNOSIS — Z7901 Long term (current) use of anticoagulants: Secondary | ICD-10-CM | POA: Diagnosis not present

## 2019-08-01 DIAGNOSIS — Z923 Personal history of irradiation: Secondary | ICD-10-CM | POA: Insufficient documentation

## 2019-08-01 DIAGNOSIS — E119 Type 2 diabetes mellitus without complications: Secondary | ICD-10-CM | POA: Diagnosis not present

## 2019-08-01 DIAGNOSIS — Z79811 Long term (current) use of aromatase inhibitors: Secondary | ICD-10-CM | POA: Insufficient documentation

## 2019-08-01 DIAGNOSIS — Z8249 Family history of ischemic heart disease and other diseases of the circulatory system: Secondary | ICD-10-CM | POA: Diagnosis not present

## 2019-08-01 DIAGNOSIS — Z8042 Family history of malignant neoplasm of prostate: Secondary | ICD-10-CM | POA: Insufficient documentation

## 2019-08-01 DIAGNOSIS — Z833 Family history of diabetes mellitus: Secondary | ICD-10-CM | POA: Diagnosis not present

## 2019-08-01 DIAGNOSIS — Z79899 Other long term (current) drug therapy: Secondary | ICD-10-CM | POA: Diagnosis not present

## 2019-08-01 DIAGNOSIS — Z90710 Acquired absence of both cervix and uterus: Secondary | ICD-10-CM | POA: Insufficient documentation

## 2019-08-01 DIAGNOSIS — F17210 Nicotine dependence, cigarettes, uncomplicated: Secondary | ICD-10-CM | POA: Diagnosis not present

## 2019-08-01 DIAGNOSIS — I10 Essential (primary) hypertension: Secondary | ICD-10-CM | POA: Insufficient documentation

## 2019-08-01 NOTE — Progress Notes (Addendum)
SURVIVORSHIP VISIT:    BRIEF ONCOLOGIC HISTORY:  Oncology History  Malignant neoplasm of upper-outer quadrant of right breast in female, estrogen receptor positive ([place])  06/19/2018 Initial Diagnosis   Screening mammogram detected right breast mass at 9 o'clock position 3 cm from the nipple, ultrasound revealed 7 x 5 x 5 mm mass, axillary ultrasound normal-appearing lymph nodes, ultrasound biopsy: Grade 1 IDC ER 100%, PR 10%, Ki-67 10%, HER-2 3+ positive, T1b N0 stage Ia   06/28/2018 Cancer Staging   Staging form: Breast, AJCC 8th Edition - Clinical stage from 06/28/2018: Stage IA (cT1b, cN0, cM0, G1, ER+, PR+, HER2+) - Signed by Nicholas Lose, MD on 06/28/2018   07/05/2018 Genetic Testing   Negative genetic testing on the common hereditary cancer panel.  The Hereditary Gene Panel offered by [facility] includes sequencing and/or deletion duplication testing of the following 48 genes: APC, ATM, AXIN2, BARD1, BMPR1A, BRCA1, BRCA2, BRIP1, CDH1, CDK4, CDKN2A (p14ARF), CDKN2A (p16INK4a), CHEK2, CTNNA1, DICER1, EPCAM (Deletion/duplication testing only), GREM1 (promoter region deletion/duplication testing only), KIT, MEN1, MLH1, MSH2, MSH3, MSH6, MUTYH, NBN, NF1, NHTL1, PALB2, PDGFRA, PMS2, POLD1, POLE, PTEN, RAD50, RAD51C, RAD51D, RNF43, SDHB, SDHC, SDHD, SMAD4, SMARCA4. STK11, TP53, TSC1, TSC2, and VHL.  The following genes were evaluated for sequence changes only: SDHA and HOXB13 c.251G>A variant only. The report date is July 05, 2018.   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    08/17/2018 -  Chemotherapy   The patient had trastuzumab (HERCEPTIN) 300 mg in sodium chloride 0.9 % 250 mL chemo infusion, 315 mg, Intravenous,  Once, 3 of 3 cycles Dose modification: 6 mg/kg (original dose 6 mg/kg, Cycle 3, Reason: Other (see comments), Comment: changing to q3wk dosing) Administration: 300 mg (08/17/2018),  150 mg (09/14/2018), 150 mg (08/31/2018), 150 mg (09/07/2018), 150 mg (09/21/2018), 168 mg (09/28/2018), 168 mg (10/05/2018), 168 mg (10/12/2018), 168 mg (10/19/2018), 168 mg (10/27/2018), 150 mg (08/24/2018), 450 mg (11/02/2018) PACLitaxel (TAXOL) 162 mg in sodium chloride 0.9 % 250 mL chemo infusion (</= 49m/m2), 80 mg/m2 = 162 mg, Intravenous,  Once, 3 of 3 cycles Administration: 162 mg (08/17/2018), 162 mg (08/24/2018), 162 mg (09/14/2018), 162 mg (08/31/2018), 162 mg (09/07/2018), 162 mg (09/21/2018), 162 mg (09/28/2018), 162 mg (10/05/2018), 162 mg (10/12/2018), 162 mg (10/19/2018), 162 mg (10/27/2018), 162 mg (11/02/2018) trastuzumab-dkst (OGIVRI) 450 mg in sodium chloride 0.9 % 250 mL chemo infusion, 450 mg (100 % of original dose 450 mg), Intravenous,  Once, 12 of 13 cycles Dose modification: 450 mg (original dose 450 mg, Cycle 4, Reason: Other (see comments), Comment: Biosimilar Conversion) Administration: 450 mg (11/23/2018), 450 mg (12/14/2018), 450 mg (01/04/2019), 450 mg (01/25/2019), 450 mg (02/15/2019), 450 mg (03/07/2019), 450 mg (03/29/2019), 450 mg (04/18/2019), 450 mg (05/09/2019), 450 mg (05/30/2019), 450 mg (06/20/2019), 450 mg (07/11/2019)  for chemotherapy treatment.    12/05/2018 - 01/02/2019 Radiation Therapy   Adjuvant XRT   01/25/2019 -  Anti-estrogen oral therapy   Anastrozole 175mdaily, plan 5-7 years   07/13/2019 Cancer Staging   Staging form: Breast, AJCC 8th Edition - Pathologic stage from 07/13/2019: Stage IA (pT1b, pN0, cM0, G2, ER+, PR+, HER2+) - Signed by CaGardenia PhlegmNP on 08/01/2019     INTERVAL HISTORY:  Sabrina [name] review her survivorship care plan detailing her treatment course for breast cancer, as well as monitoring long-term side effects of that treatment, education regarding health maintenance,  screening, and overall wellness and health promotion.     Overall, Sabrina [name] reports feeling quite well.  She continues on Anastrozole with good tolerance.  She completed the survivorship  survey.  She notes mild shortness of breath with exercise, however this is improving.  She has difficulty remembering things.  This is improving.  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  This is improving, and she has no motor deficits.    REVIEW OF SYSTEMS:  Review of Systems  Constitutional: Negative for appetite change, chills, fatigue and unexpected weight change.  HENT:   Negative for hearing loss and trouble swallowing.   Eyes: Negative for eye problems and icterus.  Respiratory: Negative for chest tightness, cough and shortness of breath.   Cardiovascular: Negative for chest pain, leg swelling and palpitations.  Gastrointestinal: Negative for abdominal distention, abdominal pain, constipation, diarrhea, nausea and vomiting.  Endocrine: Negative for hot flashes.  Musculoskeletal: Negative for arthralgias.  Skin: Negative for itching and rash.  Neurological: Negative for dizziness, extremity weakness, headaches and numbness.  Hematological: Negative for adenopathy. Does not bruise/bleed easily.  Psychiatric/Behavioral: Negative for depression. The patient is not nervous/anxious.   Breast: Denies any new nodularity, masses, tenderness, nipple changes, or nipple discharge.      ONCOLOGY TREATMENT TEAM:  1. Surgeon:  Dr. Brantley Stage at [facility] 2. Medical Oncologist: Dr. Lindi Adie  3. Radiation Oncologist: Dr. Isidore Moos    PAST MEDICAL/SURGICAL HISTORY:  Past Medical History:  Diagnosis Date  . Atrial fibrillation ([place]) [I48.91] 11/08/2016  . Benign essential hypertension 08/25/2015  . Diabetes ([place])   . Family history of breast cancer   . Family history of leukemia   . Family history of prostate cancer   . Hyperlipidemia   . Hypothyroidism due to Hashimoto's thyroiditis 05/23/2015  . Personal history of chemotherapy   . Personal history of radiation therapy    Past Surgical History:  Procedure Laterality Date  . ABDOMINAL  HYSTERECTOMY    . APPENDECTOMY    . BREAST LUMPECTOMY Right 07/13/2018  . BREAST LUMPECTOMY WITH RADIOACTIVE SEED AND SENTINEL LYMPH NODE BIOPSY Right 07/13/2018   Procedure: RIGHT BREAST LUMPECTOMY WITH RADIOACTIVE SEED AND RIGHT SENTINEL LYMPH NODE MAPPING;  Surgeon: Erroll Luna, MD;  Location: [facility];  Service: General;  Laterality: Right;  . BREAST SURGERY    . CHOLECYSTECTOMY    . KNEE SURGERY    . PORTACATH PLACEMENT Right 08/02/2018   Procedure: INSERTION PORT-A-CATH WITH ULTRASOUND;  Surgeon: Erroll Luna, MD;  Location: [facility];  Service: General;  Laterality: Right;     ALLERGIES:  Allergies  Allergen Reactions  . Atorvastatin Other (See Comments)    Muscle aches  . Meperidine Nausea And Vomiting     CURRENT MEDICATIONS:  Outpatient Encounter Medications as of 08/01/2019  Medication Sig  . anastrozole (ARIMIDEX) 1 MG tablet Take 1 tablet (1 mg total) by mouth daily.  . digoxin (LANOXIN) 0.125 MG tablet Take 1 tablet (125 mcg total) by mouth daily.  . flecainide (TAMBOCOR) 100 MG tablet TAKE 1 TABLET TWICE A DAY  . levothyroxine (SYNTHROID, LEVOTHROID) 100 MCG tablet Take 1 tablet by mouth daily.  Marland Kitchen lidocaine-prilocaine (EMLA) cream Apply to affected area once  . lisinopril (PRINIVIL,ZESTRIL) 10 MG tablet Take 1 tablet (10 mg total) by mouth daily.  . metFORMIN (GLUCOPHAGE) 500 MG tablet Take 1 tablet by mouth 2 (two) times daily with a meal.   .  metoprolol succinate (TOPROL-XL) 50 MG 24 hr tablet TAKE 1 TABLET DAILY  . warfarin (COUMADIN) 10 MG tablet Take 1/2 to 1 tablet as directed by Coumadin Clinic   No facility-administered encounter medications on file as of 08/01/2019.     ONCOLOGIC FAMILY HISTORY:  Family History  Problem Relation Age of Onset  . Prostate cancer Father   . Cancer Brother   . Acute myelogenous leukemia Brother   . Alcohol abuse Maternal Aunt   . Hypertension Maternal Aunt   . Breast cancer Maternal  Aunt        dx over 35  . Alcohol abuse Paternal Uncle   . Diabetes Paternal Uncle   . Hypertension Maternal Grandmother   . Breast cancer Mother 65       Deceased at 39  . Cancer Maternal Uncle   . Diabetes Maternal Uncle   . Alcohol abuse Maternal Uncle   . Diabetes Son   . Breast cancer Cousin        dx over 63  . Breast cancer Cousin 40     GENETIC COUNSELING/TESTING: See above  SOCIAL HISTORY:  Social History   Socioeconomic History  . Marital status: Widowed    Spouse name: Not on file  . Number of children: Not on file  . Years of education: Not on file  . Highest education level: Not on file  Occupational History  . Not on file  Tobacco Use  . Smoking status: Current Every Day Smoker    Packs/day: 2.00    Years: 40.00    Pack years: 80.00  . Smokeless tobacco: Never Used  Substance and Sexual Activity  . Alcohol use: Never  . Drug use: Never  . Sexual activity: Not on file  Other Topics Concern  . Not on file  Social History Narrative  . Not on file   Social Determinants of Health   Financial Resource Strain:   . Difficulty of Paying Living Expenses:   Food Insecurity:   . Worried About Charity fundraiser in the Last Year:   . Arboriculturist in the Last Year:   Transportation Needs: No Transportation Needs  . Lack of Transportation (Medical): No  . Lack of Transportation (Non-Medical): No  Physical Activity:   . Days of Exercise per Week:   . Minutes of Exercise per Session:   Stress:   . Feeling of Stress :   Social Connections:   . Frequency of Communication with Friends and Family:   . Frequency of Social Gatherings with Friends and Family:   . Attends Religious Services:   . Active Member of Clubs or Organizations:   . Attends Archivist Meetings:   Marland Kitchen Marital Status:   Intimate Partner Violence: Not At Risk  . Fear of Current or Ex-Partner: No  . Emotionally Abused: No  . Physically Abused: No  . Sexually Abused: No      OBSERVATIONS/OBJECTIVE:  BP (!) 160/77 (BP Location: Left Arm, Patient Position: Sitting)   Pulse 70   Temp 98.2 F (36.8 C) (Temporal)   Resp 17   Ht 5' 9" (1.753 m)   Wt 178 lb 12.8 oz (81.1 kg)   SpO2 98%   BMI 26.40 kg/m  GENERAL: Patient is a well appearing female in no acute distress HEENT:  Sclerae anicteric.  Oropharynx clear and moist. No ulcerations or evidence of oropharyngeal candidiasis. Neck is supple.  NODES:  No cervical, supraclavicular, or axillary lymphadenopathy palpated.  BREAST EXAM:  Right breast s/p lumpectomy and radiation, no sign of recurrence, left breast benign LUNGS:  Clear to auscultation bilaterally.  No wheezes or rhonchi. HEART:  Regular rate and rhythm. No murmur appreciated. ABDOMEN:  Soft, nontender.  Positive, normoactive bowel sounds. No organomegaly palpated. MSK:  No focal spinal tenderness to palpation. Full range of motion bilaterally in the upper extremities. EXTREMITIES:  No peripheral edema.   SKIN:  Clear with no obvious rashes or skin changes. No nail dyscrasia. NEURO:  Nonfocal. Well oriented.  Appropriate affect.    LABORATORY DATA:  None for this visit.  DIAGNOSTIC IMAGING:  None for this visit.      ASSESSMENT AND PLAN:  Ms.. [name] is a pleasant 69 y.o. female with Stage IA right breast invasive ductal carcinoma, ER+/PR+/HER2+, diagnosed in 06/2018, treated with lumpectomy, adjuvant chemotherapy, maintenance Herceptin, adjuvant radiation therapy, and anti-estrogen therapy with Anastrozole beginning in 01/2019.  She presents to the Survivorship Clinic for our initial meeting and routine follow-up post-completion of treatment for breast cancer.    1. Stage IA right breast cancer:  Sabrina [name] is continuing to recover from definitive treatment for breast cancer.  She is due for her final Herceptin today, however her EF has decreased from 60-65% to 50-55%.  Due to this change, and the fact that she has such a good prognosis  and continues on Anastrozole, the risk of her receiving her final dose outweigh the benefits.  She will forego her final dose, and I have sent her cardiologist a message to please review the echocardiogram.  She will f/u with him.    She will follow-up with her medical oncologist, Dr. Lindi Adie in 01/2020 with history and physical exam per surveillance protocol.  She will continue her anti-estrogen therapy with Anastrozole. Thus far, she is tolerating the Anastrozole well, with minimal side effects. She was instructed to make Dr. Lindi Adie or myself aware if she begins to experience any worsening side effects of the medication and I could see her back in clinic to help manage those side effects, as needed. Her mammogram is due 06/2020. Today, a comprehensive survivorship care plan and treatment summary was reviewed with the patient today detailing her breast cancer diagnosis, treatment course, potential late/long-term effects of treatment, appropriate follow-up care with recommendations for the future, and patient education resources.  A copy of this summary, along with a letter will be sent to the patient's primary care provider via mail/fax/In Basket message after today's visit.    2. Tobacco use:  She has a 104 pack year tobacco history.  I recommended CT lung cancer screening to her.  She declined.  She is not yet ready to quit.  She understands risks and benefits of the lung cancer screening, and that continuing to smoke can increase her lung cancer, cardiac, and decreased bone density risk amongst other health problems.  We are happy to help her quit if she ever gets to a point where she is ready.    3. Bone health:  Given Sabrina [name]'s age/history of breast cancer and her current treatment regimen including anti-estrogen therapy with Anastrozole, she is at risk for bone demineralization. She is scheduled for bone density testing on 08/14/2019.  She was given education on specific activities to promote bone  health.  4. Cancer screening:  Due to Sabrina [name]'s history and her age, she should receive screening for skin cancers, colon cancer, and gynecologic cancers.  The information and recommendations are listed on the patient's comprehensive  care plan/treatment summary and were reviewed in detail with the patient.    5. Health maintenance and wellness promotion: Sabrina [name] was encouraged to consume 5-7 servings of fruits and vegetables per day. We reviewed the "Nutrition Rainbow" handout, as well as the handout "Take Control of Your Health and Reduce Your Cancer Risk" from the [place].  She was also encouraged to engage in moderate to vigorous exercise for 30 minutes per day most days of the week. We discussed the [employer] fitness program, which is designed for cancer survivors to help them become more physically fit after cancer treatments.  She was instructed to limit her alcohol consumption and continue to abstain from tobacco use was encouraged stop smoking.     6. Support services/counseling: It is not uncommon for this period of the patient's cancer care trajectory to be one of many emotions and stressors.  We discussed how this can be increasingly difficult during the times of quarantine and social distancing due to the COVID-19 pandemic.   She was given information regarding our available services and encouraged to contact me with any questions or for help enrolling in any of our support group/programs.    Follow up instructions:    -Return to cancer center in 6 months for f/u with Dr. Lindi Adie  -Mammogram due in 06/2020 -Bone density in 08/2019 -Follow up with surgery -She is welcome to return back to the Survivorship Clinic at any time; no additional follow-up needed at this time.  -Consider referral back to survivorship as a long-term survivor for continued surveillance  Total encounter time: 30 minutes*  Wilber Bihari, NP 08/01/19 10:32 AM Medical Oncology and  Hematology [facility] [place], [place] [address] Tel. [phone]    Fax. [phone]  *Total Encounter Time as defined by the Centers for Medicare and Medicaid Services includes, in addition to the face-to-face time of a patient visit (documented in the note above) non-face-to-face time: obtaining and reviewing outside history, ordering and reviewing medications, tests or procedures, care coordination (communications with other health care professionals or caregivers) and documentation in the medical record.  Attending Note  I personally saw and examined Sabrina [name]. The plan of care was discussed with her. I agree with the assessment and plan as documented above. Based on decreased EF, agreed with stopping herceptin Port can be removed Encouraged her to stop smoking since her longevity depends on stopping smoking. RTC in 6 months  Signed Harriette Ohara, MD

## 2019-08-02 ENCOUNTER — Telehealth: Payer: Self-pay | Admitting: Hematology and Oncology

## 2019-08-02 NOTE — Telephone Encounter (Signed)
Scheduled appts per 4/21 los. Pt confirmed appt date and time.

## 2019-08-03 ENCOUNTER — Ambulatory Visit: Payer: Self-pay | Admitting: Surgery

## 2019-08-03 NOTE — H&P (View-Only) (Signed)
Sabrina [name] Appointment: 08/03/2019 10:40 AM Location: [facility] Patient #: [id number] DOB: 1950/11/30 Married / Language: Sabrina [name] / Race: White Female  History of Present Illness Sabrina [name] A. Sabrina Kindig MD; 08/03/2019 11:09 AM) Patient words: 69 y.o. female with Stage IA right breast invasive ductal carcinoma, ER+/PR+/HER2+, diagnosed in 06/2018, treated with lumpectomy, adjuvant chemotherapy, maintenance Herceptin, adjuvant radiation therapy, and anti-estrogen therapy with Anastrozole beginning in 01/2019.  The patient is a 70 year old female.   Allergies (Sabrina [name], Sabrina [name]; 08/03/2019 10:36 AM) Atorvastatin Calcium *ANTIHYPERLIPIDEMICS* Meperidine HCl *ANALGESICS - OPIOID* Allergies Reconciled  Medication History (Sabrina [name], [place]; 08/03/2019 10:37 AM) Digoxin (125MCG Tablet, Oral) Active. Escitalopram Oxalate (10MG Tablet, Oral) Active. Lisinopril (10MG Tablet, Oral) Active. metFORMIN HCl (500MG Tablet, Oral) Active. Synthroid (100MCG Tablet, Oral) Active. Toprol XL (50MG Tablet ER 24HR, Oral) Active. Warfarin Sodium (10MG Tablet, Oral) Active. Tambocor (100MG Tablet, Oral) Active. Anastrozole (1MG Tablet, Oral) Active. Medications Reconciled    Vitals (Sabrina [name] RMA; 08/03/2019 10:37 AM) 08/03/2019 10:37 AM Weight: 179.4 lb Height: 69in Body Surface Area: 1.97 m Body Mass Index: 26.49 kg/m  Temp.: 98.21F  Pulse: 68 (Regular)  BP: 134/86(Sitting, Left Arm, Standard)        Physical Exam (Sabrina [name] A. Sabrina Seda MD; 08/03/2019 1:10 PM)  General Mental Status-Alert. General Appearance-Consistent with stated age. Hydration-Well hydrated. Voice-Normal.  Breast Note: Right breast incision is well healed. Right-sided Port-A-Cath intact. No suspicious masses.  Neurologic Neurologic evaluation reveals -alert and oriented x 3 with no impairment of recent or remote memory. Mental Status-Normal.  Lymphatic Head  & Neck  General Head & Neck Lymphatics: Bilateral - Description - Normal. Axillary  General Axillary Region: Bilateral - Description - Normal. Tenderness - Non Tender.    Assessment & Plan (Sabrina [name] A. Sabrina Cadden MD; 08/03/2019 1:10 PM)  BREAST CANCER, RIGHT (C50.911) Impression: port removal NAD  Scheduled for port removal. Risks, benefits and other options discussed with the patient. She agrees to proceed.  Current Plans I recommended surgery to remove the catheter. I explained the technique of removal with use of local anesthesia & possible need for more aggressive sedation/anesthesia for patient comfort.  Risks such as bleeding, infection, and other risks were discussed. Post-operative dressing/incision care was discussed. I noted a good likelihood this will help address the problem. We will work to minimize complications. Questions were answered. The patient expresses understanding & wishes to proceed with surgery.  Pt Education - CCS Free Text Education/Instructions: discussed with patient and provided information.

## 2019-08-03 NOTE — H&P (Signed)
[name] Appointment: 08/03/2019 10:40 AM Location: [facility] Patient #: [id number] DOB: 1950-04-16 Married / Language: Sabrina [name] / Race: White Female  History of Present Illness Sabrina [name]; 08/03/2019 11:09 AM) Patient words: 70 y.o. female with Stage IA right breast invasive ductal carcinoma, ER+/PR+/HER2+, diagnosed in 06/2018, treated with lumpectomy, adjuvant chemotherapy, maintenance Herceptin, adjuvant radiation therapy, and anti-estrogen therapy with Anastrozole beginning in 01/2019.  The patient is a 69 year old female.   Allergies (Sabrina [name], [place]; 08/03/2019 10:36 AM) Atorvastatin Calcium *ANTIHYPERLIPIDEMICS* Meperidine HCl *ANALGESICS - OPIOID* Allergies Reconciled  Medication History (Sabrina [name], [place]; 08/03/2019 10:37 AM) Digoxin (125MCG Tablet, Oral) Active. Escitalopram Oxalate (10MG Tablet, Oral) Active. Lisinopril (10MG Tablet, Oral) Active. metFORMIN HCl (500MG Tablet, Oral) Active. Synthroid (100MCG Tablet, Oral) Active. Toprol XL (50MG Tablet ER 24HR, Oral) Active. Warfarin Sodium (10MG Tablet, Oral) Active. Tambocor (100MG Tablet, Oral) Active. Anastrozole (1MG Tablet, Oral) Active. Medications Reconciled    Vitals (Sabrina [name]; 08/03/2019 10:37 AM) 08/03/2019 10:37 AM Weight: 179.4 lb Height: 69in Body Surface Area: 1.97 m Body Mass Index: 26.49 kg/m  Temp.: 98.74F  Pulse: 68 (Regular)  BP: 134/86(Sitting, Left Arm, Standard)        Physical Exam (Sabrina [name]; 08/03/2019 1:10 PM)  General Mental Status-Alert. General Appearance-Consistent with stated age. Hydration-Well hydrated. Voice-Normal.  Breast Note: Right breast incision is well healed. Right-sided Port-A-Cath intact. No suspicious masses.  Neurologic Neurologic evaluation reveals -alert and oriented x 3 with no impairment of recent or remote memory. Mental Status-Normal.  Lymphatic Head  & Neck  General Head & Neck Lymphatics: Bilateral - Description - Normal. Axillary  General Axillary Region: Bilateral - Description - Normal. Tenderness - Non Tender.    Assessment & Plan (Sabrina [name]; 08/03/2019 1:10 PM)  BREAST CANCER, RIGHT (C50.911) Impression: port removal NAD  Scheduled for port removal. Risks, benefits and other options discussed with the patient. She agrees to proceed.  Current Plans I recommended surgery to remove the catheter. I explained the technique of removal with use of local anesthesia & possible need for more aggressive sedation/anesthesia for patient comfort.  Risks such as bleeding, infection, and other risks were discussed. Post-operative dressing/incision care was discussed. I noted a good likelihood this will help address the problem. We will work to minimize complications. Questions were answered. The patient expresses understanding & wishes to proceed with surgery.  Pt Education - CCS Free Text Education/Instructions: discussed with patient and provided information.

## 2019-08-10 ENCOUNTER — Telehealth: Payer: Self-pay | Admitting: Cardiology

## 2019-08-10 NOTE — Telephone Encounter (Signed)
Pt is having surgery and is requesting preop instructions on holding Coumadin.  Call to patient to explain surgeon's office will fax preop clearance request and instructions will be provided to the surgeon on the form.  Encouraged patient to contact surgeon's office to request preop clearance be sent.  Writer did not note preop clearance in the pool.

## 2019-08-10 NOTE — Telephone Encounter (Signed)
Patient would like to speak to nurse. She is trying to get a surgery schedule and needs approval to get off of coumadin.

## 2019-08-14 ENCOUNTER — Ambulatory Visit
Admission: RE | Admit: 2019-08-14 | Discharge: 2019-08-14 | Disposition: A | Discharge status: Home / Self Care | Payer: Medicare Other | Source: Ambulatory Visit | Attending: Adult Health | Admitting: Adult Health

## 2019-08-14 ENCOUNTER — Other Ambulatory Visit: Payer: Self-pay

## 2019-08-14 ENCOUNTER — Telehealth: Payer: Self-pay | Admitting: Emergency Medicine

## 2019-08-14 DIAGNOSIS — E2839 Other primary ovarian failure: Secondary | ICD-10-CM

## 2019-08-14 NOTE — Telephone Encounter (Signed)
   Primary Cardiologist: No primary care provider on file.  Chart reviewed as part of pre-operative protocol coverage. Patient was contacted 08/14/2019 in reference to pre-operative risk assessment for pending surgery as outlined below.  Sabrina [name] was last seen on 11/20 by Dr. Agustin Cree.  Since that day, Sabrina [name] has done well from a cardiac standpoint.  Therefore, based on ACC/AHA guidelines, the patient would be at acceptable risk for the planned procedure without further cardiovascular testing.   In regards to her coumadin per pharmacy-  Procedure: port removal Date of procedure: TBD  CHADS2-VASc score of 4 (age, sex, HTN, DM)  CrCl >186mL/min Platelet count 240  Per office protocol, patient can hold warfarin for 5 days prior to procedure.    I will route this recommendation to the requesting party via Epic fax function and remove from pre-op pool.  Please call with questions.  Reino Bellis, NP 08/14/2019, 3:32 PM

## 2019-08-14 NOTE — Telephone Encounter (Signed)
Sharyn Lull from [facility] [place] [facility] called asking about clearance for patient. I called her back informed her that this was sent to our preop pool and the approval letter should be faxed today, if not I advised her to follow up with me. No further questions.

## 2019-08-14 NOTE — Telephone Encounter (Signed)
Patient with diagnosis of afib on warfarin for anticoagulation.    Procedure: port removal Date of procedure: TBD  CHADS2-VASc score of 4 (age, sex, HTN, DM)  CrCl >150mL/min Platelet count 240  Per office protocol, patient can hold warfarin for 5 days prior to procedure.

## 2019-08-14 NOTE — Telephone Encounter (Signed)
   [facility] Pre-operative Risk Assessment    Request for surgical clearance:  1. What type of surgery is being performed? Port removal    2. When is this surgery scheduled? Not scheduled    3. What type of clearance is required (medical clearance vs. Pharmacy clearance to hold med vs. Both)?  BOTH  4. Are there any medications that need to be held prior to surgery and how long?Coumadin hold 5 days before   5. Practice name and name of physician performing surgery? [facility], Dr. Brantley Stage   6. What is your office phone number [phone]    7.   What is your office fax number [phone]  8.   Anesthesia type (None, local, MAC, general) ? [place] 08/14/2019, 2:00 PM  _________________________________________________________________   (provider comments below)

## 2019-08-15 ENCOUNTER — Telehealth: Payer: Self-pay | Admitting: *Deleted

## 2019-08-15 NOTE — Telephone Encounter (Signed)
Per Wilber Bihari, NP, called to notify pt that her bone density scan showed osteopenia and recommendations are weight bearing exercises, calcium and vitamin D, along with smoking cessation. Advised if worsens, she will need a building therapy with bisphosphonates. Pt stated, "if I could stop smoking I would have been stopped." Pt verbalized understanding

## 2019-08-16 ENCOUNTER — Encounter (HOSPITAL_BASED_OUTPATIENT_CLINIC_OR_DEPARTMENT_OTHER): Payer: Self-pay | Admitting: Surgery

## 2019-08-16 ENCOUNTER — Other Ambulatory Visit: Payer: Self-pay

## 2019-08-17 ENCOUNTER — Encounter (HOSPITAL_BASED_OUTPATIENT_CLINIC_OR_DEPARTMENT_OTHER)
Admission: RE | Admit: 2019-08-17 | Discharge: 2019-08-17 | Disposition: A | Discharge status: Home / Self Care | Payer: Medicare Other | Source: Ambulatory Visit | Attending: Surgery | Admitting: Surgery

## 2019-08-17 ENCOUNTER — Other Ambulatory Visit (HOSPITAL_COMMUNITY)
Admission: RE | Admit: 2019-08-17 | Discharge: 2019-08-17 | Disposition: A | Discharge status: Home / Self Care | Payer: Medicare Other | Source: Ambulatory Visit | Attending: Surgery | Admitting: Surgery

## 2019-08-17 DIAGNOSIS — Z20822 Contact with and (suspected) exposure to covid-19: Secondary | ICD-10-CM | POA: Diagnosis not present

## 2019-08-17 DIAGNOSIS — Z01812 Encounter for preprocedural laboratory examination: Secondary | ICD-10-CM | POA: Diagnosis present

## 2019-08-17 LAB — CBC WITH DIFFERENTIAL/PLATELET
Abs Immature Granulocytes: 0.01 10*3/uL (ref 0.00–0.07)
Basophils Absolute: 0 10*3/uL (ref 0.0–0.1)
Basophils Relative: 1 %
Eosinophils Absolute: 0.1 10*3/uL (ref 0.0–0.5)
Eosinophils Relative: 2 %
HCT: 39.5 % (ref 36.0–46.0)
Hemoglobin: 11.8 g/dL — ABNORMAL LOW (ref 12.0–15.0)
Immature Granulocytes: 0 %
Lymphocytes Relative: 19 %
Lymphs Abs: 1.2 10*3/uL (ref 0.7–4.0)
MCH: 25.7 pg — ABNORMAL LOW (ref 26.0–34.0)
MCHC: 29.9 g/dL — ABNORMAL LOW (ref 30.0–36.0)
MCV: 85.9 fL (ref 80.0–100.0)
Monocytes Absolute: 0.5 10*3/uL (ref 0.1–1.0)
Monocytes Relative: 9 %
Neutro Abs: 4.3 10*3/uL (ref 1.7–7.7)
Neutrophils Relative %: 69 %
Platelets: 173 10*3/uL (ref 150–400)
RBC: 4.6 MIL/uL (ref 3.87–5.11)
RDW: 18.6 % — ABNORMAL HIGH (ref 11.5–15.5)
WBC: 6.2 10*3/uL (ref 4.0–10.5)
nRBC: 0 % (ref 0.0–0.2)

## 2019-08-17 LAB — COMPREHENSIVE METABOLIC PANEL
ALT: 14 U/L (ref 0–44)
AST: 16 U/L (ref 15–41)
Albumin: 3.8 g/dL (ref 3.5–5.0)
Alkaline Phosphatase: 65 U/L (ref 38–126)
Anion gap: 7 (ref 5–15)
BUN: 10 mg/dL (ref 8–23)
CO2: 31 mmol/L (ref 22–32)
Calcium: 9.2 mg/dL (ref 8.9–10.3)
Chloride: 99 mmol/L (ref 98–111)
Creatinine, Ser: 0.54 mg/dL (ref 0.44–1.00)
GFR calc Af Amer: >60 mL/min (ref >60)
GFR calc non Af Amer: >60 mL/min (ref >60)
Glucose, Bld: 153 mg/dL — ABNORMAL HIGH (ref 70–99)
Potassium: 4.3 mmol/L (ref 3.5–5.1)
Sodium: 137 mmol/L (ref 135–145)
Total Bilirubin: 0.4 mg/dL (ref 0.3–1.2)
Total Protein: 6.3 g/dL — ABNORMAL LOW (ref 6.5–8.1)

## 2019-08-17 LAB — SARS CORONAVIRUS 2 (TAT 6-24 HRS): SARS Coronavirus 2: NEGATIVE

## 2019-08-17 NOTE — Progress Notes (Signed)

## 2019-08-21 ENCOUNTER — Ambulatory Visit (HOSPITAL_BASED_OUTPATIENT_CLINIC_OR_DEPARTMENT_OTHER): Payer: Medicare Other | Admitting: Anesthesiology

## 2019-08-21 ENCOUNTER — Encounter (HOSPITAL_BASED_OUTPATIENT_CLINIC_OR_DEPARTMENT_OTHER): Payer: Self-pay | Admitting: Surgery

## 2019-08-21 ENCOUNTER — Encounter (HOSPITAL_BASED_OUTPATIENT_CLINIC_OR_DEPARTMENT_OTHER)
Admission: RE | Disposition: A | Discharge status: Home / Self Care | Payer: Self-pay | Source: Home / Self Care | Attending: Surgery

## 2019-08-21 ENCOUNTER — Ambulatory Visit (HOSPITAL_BASED_OUTPATIENT_CLINIC_OR_DEPARTMENT_OTHER)
Admission: RE | Admit: 2019-08-21 | Discharge: 2019-08-21 | Disposition: A | Discharge status: Home / Self Care | Payer: Medicare Other | Attending: Surgery | Admitting: Surgery

## 2019-08-21 ENCOUNTER — Other Ambulatory Visit: Payer: Self-pay

## 2019-08-21 DIAGNOSIS — F329 Major depressive disorder, single episode, unspecified: Secondary | ICD-10-CM | POA: Insufficient documentation

## 2019-08-21 DIAGNOSIS — Z79899 Other long term (current) drug therapy: Secondary | ICD-10-CM | POA: Insufficient documentation

## 2019-08-21 DIAGNOSIS — Z79890 Hormone replacement therapy (postmenopausal): Secondary | ICD-10-CM | POA: Insufficient documentation

## 2019-08-21 DIAGNOSIS — I4891 Unspecified atrial fibrillation: Secondary | ICD-10-CM | POA: Diagnosis not present

## 2019-08-21 DIAGNOSIS — E119 Type 2 diabetes mellitus without complications: Secondary | ICD-10-CM | POA: Insufficient documentation

## 2019-08-21 DIAGNOSIS — Z452 Encounter for adjustment and management of vascular access device: Secondary | ICD-10-CM | POA: Diagnosis present

## 2019-08-21 DIAGNOSIS — Z7984 Long term (current) use of oral hypoglycemic drugs: Secondary | ICD-10-CM | POA: Diagnosis not present

## 2019-08-21 DIAGNOSIS — Z853 Personal history of malignant neoplasm of breast: Secondary | ICD-10-CM | POA: Insufficient documentation

## 2019-08-21 DIAGNOSIS — Z9221 Personal history of antineoplastic chemotherapy: Secondary | ICD-10-CM | POA: Insufficient documentation

## 2019-08-21 DIAGNOSIS — F17200 Nicotine dependence, unspecified, uncomplicated: Secondary | ICD-10-CM | POA: Diagnosis not present

## 2019-08-21 DIAGNOSIS — I10 Essential (primary) hypertension: Secondary | ICD-10-CM | POA: Insufficient documentation

## 2019-08-21 HISTORY — DX: Cardiac arrhythmia, unspecified: I49.9

## 2019-08-21 HISTORY — PX: PORT-A-CATH REMOVAL: SHX5289

## 2019-08-21 LAB — GLUCOSE, CAPILLARY
Glucose-Capillary: 108 mg/dL — ABNORMAL HIGH (ref 70–99)
Glucose-Capillary: 104 mg/dL — ABNORMAL HIGH (ref 70–99)

## 2019-08-21 SURGERY — REMOVAL PORT-A-CATH
Anesthesia: Monitor Anesthesia Care | Site: Chest

## 2019-08-21 MED ORDER — LIDOCAINE 2% (20 MG/ML) 5 ML SYRINGE
INTRAMUSCULAR | Status: DC | PRN
  Administered 2019-08-21: 2 mg via INTRAVENOUS

## 2019-08-21 MED ORDER — CEFAZOLIN SODIUM-DEXTROSE 2-4 GM/100ML-% IV SOLN
INTRAVENOUS | Status: AC
  Filled 2019-08-21: qty 100

## 2019-08-21 MED ORDER — FENTANYL CITRATE (PF) 100 MCG/2ML IJ SOLN: 25.0000 ug | INTRAMUSCULAR | Status: DC | PRN

## 2019-08-21 MED ORDER — MIDAZOLAM HCL 2 MG/2ML IJ SOLN
INTRAMUSCULAR | Status: AC
  Filled 2019-08-21: qty 2

## 2019-08-21 MED ORDER — OXYCODONE HCL 5 MG/5ML PO SOLN: 5.0000 mg | Freq: Once | ORAL | Status: DC | PRN

## 2019-08-21 MED ORDER — ACETAMINOPHEN 500 MG PO TABS
ORAL_TABLET | ORAL | Status: AC
  Filled 2019-08-21: qty 2

## 2019-08-21 MED ORDER — CHLORHEXIDINE GLUCONATE CLOTH 2 % EX PADS: 6.0000 | MEDICATED_PAD | Freq: Once | CUTANEOUS | Status: DC

## 2019-08-21 MED ORDER — FENTANYL CITRATE (PF) 100 MCG/2ML IJ SOLN
INTRAMUSCULAR | Status: AC
  Filled 2019-08-21: qty 2

## 2019-08-21 MED ORDER — ONDANSETRON HCL 4 MG/2ML IJ SOLN: 4.0000 mg | Freq: Once | INTRAMUSCULAR | Status: DC | PRN

## 2019-08-21 MED ORDER — BUPIVACAINE HCL (PF) 0.25 % IJ SOLN
INTRAMUSCULAR | Status: AC
  Filled 2019-08-21: qty 30

## 2019-08-21 MED ORDER — FENTANYL CITRATE (PF) 100 MCG/2ML IJ SOLN
INTRAMUSCULAR | Status: DC | PRN
  Administered 2019-08-21: 100 ug via INTRAVENOUS

## 2019-08-21 MED ORDER — OXYCODONE HCL 5 MG PO TABS: 5.0000 mg | ORAL_TABLET | Freq: Once | ORAL | Status: DC | PRN

## 2019-08-21 MED ORDER — PROPOFOL 500 MG/50ML IV EMUL
INTRAVENOUS | Status: DC | PRN
  Administered 2019-08-21: 50 ug/kg/min via INTRAVENOUS

## 2019-08-21 MED ORDER — IBUPROFEN 800 MG PO TABS
800.0000 mg | ORAL_TABLET | Freq: Three times a day (TID) | ORAL | 0 refills | Status: DC | PRN
Start: 2019-08-21 — End: 2021-01-01

## 2019-08-21 MED ORDER — ACETAMINOPHEN 500 MG PO TABS
1000.0000 mg | ORAL_TABLET | ORAL | Status: AC
  Administered 2019-08-21: 1000 mg via ORAL

## 2019-08-21 MED ORDER — LACTATED RINGERS IV SOLN: INTRAVENOUS | Status: DC

## 2019-08-21 MED ORDER — CEFAZOLIN SODIUM-DEXTROSE 2-4 GM/100ML-% IV SOLN: 2.0000 g | INTRAVENOUS | Status: DC

## 2019-08-21 MED ORDER — BUPIVACAINE-EPINEPHRINE 0.25% -1:200000 IJ SOLN
INTRAMUSCULAR | Status: DC | PRN
  Administered 2019-08-21: 10 mL

## 2019-08-21 SURGICAL SUPPLY — 38 items
ADH SKN CLS APL DERMABOND .7 (GAUZE/BANDAGES/DRESSINGS) ×1
APL PRP STRL LF DISP 70% ISPRP (MISCELLANEOUS) ×1
APL SKNCLS STERI-STRIP NONHPOA (GAUZE/BANDAGES/DRESSINGS)
BENZOIN TINCTURE PRP APPL 2/3 (GAUZE/BANDAGES/DRESSINGS) IMPLANT
BLADE SURG 15 STRL LF DISP TIS (BLADE) ×1 IMPLANT
BLADE SURG 15 STRL SS (BLADE) ×3
CHLORAPREP W/TINT 26 (MISCELLANEOUS) ×3 IMPLANT
CLOSURE WOUND 1/2 X4 (GAUZE/BANDAGES/DRESSINGS)
COVER BACK TABLE 60X90IN (DRAPES) ×3 IMPLANT
COVER MAYO STAND STRL (DRAPES) ×3 IMPLANT
COVER WAND RF STERILE (DRAPES) IMPLANT
DECANTER SPIKE VIAL GLASS SM (MISCELLANEOUS) ×3 IMPLANT
DERMABOND ADVANCED (GAUZE/BANDAGES/DRESSINGS) ×2
DERMABOND ADVANCED .7 DNX12 (GAUZE/BANDAGES/DRESSINGS) ×1 IMPLANT
DRAPE LAPAROTOMY 100X72 PEDS (DRAPES) ×3 IMPLANT
DRAPE UTILITY XL STRL (DRAPES) ×3 IMPLANT
ELECT REM PT RETURN 9FT ADLT (ELECTROSURGICAL) ×3
ELECTRODE REM PT RTRN 9FT ADLT (ELECTROSURGICAL) ×1 IMPLANT
GLOVE BIOGEL PI IND STRL 7.0 (GLOVE) IMPLANT
GLOVE BIOGEL PI IND STRL 8 (GLOVE) ×1 IMPLANT
GLOVE BIOGEL PI INDICATOR 7.0 (GLOVE) ×4
GLOVE BIOGEL PI INDICATOR 8 (GLOVE) ×2
GLOVE ECLIPSE 6.5 STRL STRAW (GLOVE) ×2 IMPLANT
GLOVE ECLIPSE 8.0 STRL XLNG CF (GLOVE) ×3 IMPLANT
GOWN STRL REUS W/ TWL LRG LVL3 (GOWN DISPOSABLE) ×2 IMPLANT
GOWN STRL REUS W/TWL LRG LVL3 (GOWN DISPOSABLE) ×6
NDL HYPO 25X1 1.5 SAFETY (NEEDLE) ×1 IMPLANT
NEEDLE HYPO 25X1 1.5 SAFETY (NEEDLE) ×3 IMPLANT
NS IRRIG 1000ML POUR BTL (IV SOLUTION) ×3 IMPLANT
PENCIL SMOKE EVACUATOR (MISCELLANEOUS) IMPLANT
SET BASIN DAY SURGERY F.S. (CUSTOM PROCEDURE TRAY) ×3 IMPLANT
SLEEVE SCD COMPRESS KNEE MED (MISCELLANEOUS) IMPLANT
SPONGE LAP 4X18 RFD (DISPOSABLE) ×3 IMPLANT
STRIP CLOSURE SKIN 1/2X4 (GAUZE/BANDAGES/DRESSINGS) IMPLANT
SUT MON AB 4-0 PC3 18 (SUTURE) ×3 IMPLANT
SUT VICRYL 3-0 CR8 SH (SUTURE) ×3 IMPLANT
SYR CONTROL 10ML LL (SYRINGE) ×3 IMPLANT
TOWEL GREEN STERILE FF (TOWEL DISPOSABLE) ×3 IMPLANT

## 2019-08-21 NOTE — Interval H&P Note (Signed)
History and Physical Interval Note:  08/21/2019 2:51 PM  Sabrina [name]  has presented today for surgery, with the diagnosis of PORT IN PLACE.  The various methods of treatment have been discussed with the patient and family. After consideration of risks, benefits and other options for treatment, the patient has consented to  Procedure(s): REMOVAL PORT-A-CATH (N/A) as a surgical intervention.  The patient's history has been reviewed, patient examined, no change in status, stable for surgery.  I have reviewed the patient's chart and labs.  Questions were answered to the patient's satisfaction.     [place]

## 2019-08-21 NOTE — Op Note (Signed)
Preop diagnosis: Indwelling port a catheter for chemotherapy  Postop diagnosis: Same  Procedure: Removal of port a catheter  Surgeon: Josephene Marrone M.D.  Anesthesia: MAC with local  EBL: Minimal  Specimen none  Drains: None  Indications for procedure: The patient presents for removal of port a catheter after completing chemotherapy. The patient no longer requires central venous access. Risks of bleeding, infection, catheter fragmentation, embolization, arrhythmias and damage to arteries, veins and nerves and possibly other mediastinal structures discussed. The patient agrees to proceed.  Description of procedure: The patient was seen in the holding area. Questions were answered. The patient agreed to proceed. The patient was taken to the operating room. The patient was placed supine. Anesthesia was initiated. The skin on the upper chest was prepped and draped in a sterile fashion. Timeout was done. The patient received preoperative antibiotics. Incision was made through the old port site and the hub of the Port-A-Cath was seen. The sutures were cut to release the port from the chest wall. The catheter was removed in its entirety without difficulty. The tract was closed with 3-0 Vicryl. 4 Monocryl was used to close the skin. All final counts were correct. The patient was taken to recovery in satisfactory condition.  

## 2019-08-21 NOTE — Anesthesia Postprocedure Evaluation (Signed)
Anesthesia Post Note  Patient: Sabrina [name]  Procedure(s) Performed: REMOVAL PORT-A-CATH (N/A Chest)     Patient location during evaluation: PACU Anesthesia Type: MAC Level of consciousness: awake and alert Pain management: pain level controlled Vital Signs Assessment: post-procedure vital signs reviewed and stable Respiratory status: spontaneous breathing, nonlabored ventilation and respiratory function stable Cardiovascular status: blood pressure returned to baseline and stable Postop Assessment: no apparent nausea or vomiting Anesthetic complications: no    Last Vitals:  Vitals:   08/21/19 1545 08/21/19 1600  BP: 139/65   Pulse: (!) 54 (!) 53  Resp: 18 18  Temp:    SpO2: 92% 98%    Last Pain:  Vitals:   08/21/19 1600  TempSrc:   PainSc: 0-No pain                 [name]

## 2019-08-21 NOTE — Transfer of Care (Signed)
Immediate Anesthesia Transfer of Care Note  Patient: Sabrina [name]  Procedure(s) Performed: REMOVAL PORT-A-CATH (N/A Chest)  Patient Location: PACU  Anesthesia Type:MAC  Level of Consciousness: awake, alert  and oriented  Airway & Oxygen Therapy: Patient Spontanous Breathing and Patient connected to face mask oxygen  Post-op Assessment: Report given to RN and Post -op Vital signs reviewed and stable  Post vital signs: Reviewed and stable  Last Vitals:  Vitals Value Taken Time  BP 133/77 08/21/19 1532  Temp    Pulse 52 08/21/19 1534  Resp 14 08/21/19 1534  SpO2 99 % 08/21/19 1534  Vitals shown include unvalidated device data.  Last Pain:  Vitals:   08/21/19 1328  TempSrc: Tympanic  PainSc: 0-No pain         Complications: No apparent anesthesia complications

## 2019-08-21 NOTE — Anesthesia Preprocedure Evaluation (Signed)
Anesthesia Evaluation  Patient identified by MRN, date of birth, ID band Patient awake    Reviewed: Allergy & Precautions, NPO status , Patient's Chart, lab work & pertinent test results  History of Anesthesia Complications Negative for: history of anesthetic complications  Airway Mallampati: II  TM Distance: >3 FB Neck ROM: Full    Dental  (+) Edentulous Upper, Edentulous Lower   Pulmonary neg pulmonary ROS, Current Smoker and Patient abstained from smoking.,    Pulmonary exam normal        Cardiovascular hypertension, Pt. on medications and Pt. on home beta blockers Normal cardiovascular exam+ dysrhythmias Atrial Fibrillation      Neuro/Psych PSYCHIATRIC DISORDERS Depression negative neurological ROS     GI/Hepatic negative GI ROS, Neg liver ROS,   Endo/Other  diabetes, Oral Hypoglycemic AgentsHypothyroidism   Renal/GU negative Renal ROS  negative genitourinary   Musculoskeletal negative musculoskeletal ROS (+)   Abdominal   Peds  Hematology negative hematology ROS (+)   Anesthesia Other Findings  TTE 02/2018: EF 55-60%, grade 2 dd, moderately dilated ascending aorta, valves OK  Breast cancer s/p lumpectomy/chemo/XRT  Reproductive/Obstetrics                             Anesthesia Physical  Anesthesia Plan  ASA: III  Anesthesia Plan: MAC   Post-op Pain Management:    Induction: Intravenous  PONV Risk Score and Plan: 1 and Treatment may vary due to age or medical condition, Propofol infusion and TIVA  Airway Management Planned: Nasal Cannula, Simple Face Mask and Natural Airway  Additional Equipment: None  Intra-op Plan:   Post-operative Plan: Extubation in OR  Informed Consent: I have reviewed the patients History and Physical, chart, labs and discussed the procedure including the risks, benefits and alternatives for the proposed anesthesia with the patient or authorized  representative who has indicated his/her understanding and acceptance.       Plan Discussed with:   Anesthesia Plan Comments:         Anesthesia Quick Evaluation

## 2019-08-21 NOTE — Discharge Instructions (Signed)
GENERAL SURGERY: POST OP INSTRUCTIONS  ######################################################################  EAT Gradually transition to a high fiber diet with a fiber supplement over the next few weeks after discharge.  Start with a pureed / full liquid diet (see below)  WALK Walk an hour a day.  Control your pain to do that.    CONTROL PAIN Control pain so that you can walk, sleep, tolerate sneezing/coughing, go up/down stairs.  HAVE A BOWEL MOVEMENT DAILY Keep your bowels regular to avoid problems.  OK to try a laxative to override constipation.  OK to use an antidairrheal to slow down diarrhea.  Call if not better after 2 tries  CALL IF YOU HAVE PROBLEMS/CONCERNS Call if you are still struggling despite following these instructions. Call if you have concerns not answered by these instructions  ######################################################################    1. DIET: Follow a light bland diet & liquids the first 24 hours after arrival home, such as soup, liquids, starches, etc.  Be sure to drink plenty of fluids.  Quickly advance to a usual solid diet within a few days.  Avoid fast food or heavy meals as your are more likely to get nauseated or have irregular bowels.  A low-fat, high-fiber diet for the rest of your life is ideal.   2. Take your usually prescribed home medications unless otherwise directed. 3. PAIN CONTROL: a. Pain is best controlled by a usual combination of three different methods TOGETHER: i. Ice/Heat ii. Over the counter pain medication iii. Prescription pain medication b. Most patients will experience some swelling and bruising around the incisions.  Ice packs or heating pads (30-60 minutes up to 6 times a day) will help. Use ice for the first few days to help decrease swelling and bruising, then switch to heat to help relax tight/sore spots and speed recovery.  Some people prefer to use ice alone, heat alone, alternating between ice & heat.   Experiment to what works for you.  Swelling and bruising can take several weeks to resolve.   c. It is helpful to take an over-the-counter pain medication regularly for the first few weeks.  Choose one of the following that works best for you: i. Naproxen (Aleve, etc)  Two 220mg tabs twice a day ii. Ibuprofen (Advil, etc) Three 200mg tabs four times a day (every meal & bedtime) iii. Acetaminophen (Tylenol, etc) 500-650mg four times a day (every meal & bedtime) d. A  prescription for pain medication (such as oxycodone, hydrocodone, etc) should be given to you upon discharge.  Take your pain medication as prescribed.  i. If you are having problems/concerns with the prescription medicine (does not control pain, nausea, vomiting, rash, itching, etc), please call us [phone] to see if we need to switch you to a different pain medicine that will work better for you and/or control your side effect better. ii. If you need a refill on your pain medication, please contact your pharmacy.  They will contact our office to request authorization. Prescriptions will not be filled after 5 pm or on week-ends. 4. Avoid getting constipated.  Between the surgery and the pain medications, it is common to experience some constipation.  Increasing fluid intake and taking a fiber supplement (such as Metamucil, Citrucel, FiberCon, MiraLax, etc) 1-2 times a day regularly will usually help prevent this problem from occurring.  A mild laxative (prune juice, Milk of Magnesia, MiraLax, etc) should be taken according to package directions if there are no bowel movements after 48 hours.   5. Wash /   shower every day.  You may shower over the dressings as they are waterproof.  Continue to shower over incision(s) after the dressing is off. 6. Remove your waterproof bandages 5 days after surgery.  You may leave the incision open to air.  You may have skin tapes (Steri Strips) covering the incision(s).  Leave them on until one week, then  remove.  You may replace a dressing/Band-Aid to cover the incision for comfort if you wish.      7. ACTIVITIES as tolerated:   a. You may resume regular (light) daily activities beginning the next day--such as daily self-care, walking, climbing stairs--gradually increasing activities as tolerated.  If you can walk 30 minutes without difficulty, it is safe to try more intense activity such as jogging, treadmill, bicycling, low-impact aerobics, swimming, etc. b. Save the most intensive and strenuous activity for last such as sit-ups, heavy lifting, contact sports, etc  Refrain from any heavy lifting or straining until you are off narcotics for pain control.   c. DO NOT PUSH THROUGH PAIN.  Let pain be your guide: If it hurts to do something, don't do it.  Pain is your body warning you to avoid that activity for another week until the pain goes down. d. You may drive when you are no longer taking prescription pain medication, you can comfortably wear a seatbelt, and you can safely maneuver your car and apply brakes. e. You may have sexual intercourse when it is comfortable.  8. FOLLOW UP in our office a. Please call [facility] at [phone] to set up an appointment to see your surgeon in the office for a follow-up appointment approximately 2-3 weeks after your surgery. b. Make sure that you call for this appointment the day you arrive home to insure a convenient appointment time. 9. IF YOU HAVE DISABILITY OR FAMILY LEAVE FORMS, BRING THEM TO THE OFFICE FOR PROCESSING.  DO NOT GIVE THEM TO YOUR DOCTOR.   WHEN TO CALL US [phone]: 1. Poor pain control 2. Reactions / problems with new medications (rash/itching, nausea, etc)  3. Fever over 101.5 F (38.5 C) 4. Worsening swelling or bruising 5. Continued bleeding from incision. 6. Increased pain, redness, or drainage from the incision 7. Difficulty breathing / swallowing   The clinic staff is available to answer your questions during regular  business hours (8:30am-5pm).  Please don't hesitate to call and ask to speak to one of our nurses for clinical concerns.   If you have a medical emergency, go to the nearest emergency room or call 911.  A surgeon from [facility] is always on call at the hospitals   [facility] [address], [address], [place], [place]  [address] ? MAIN: [phone] ? TOLL FREE: [phone] ?  FAX [phone] [email or web address]    Post Anesthesia Home Care Instructions  Activity: Get plenty of rest for the remainder of the day. A responsible individual must stay with you for 24 hours following the procedure.  For the next 24 hours, DO NOT: -Drive a car -Operate machinery -Drink alcoholic beverages -Take any medication unless instructed by your physician -Make any legal decisions or sign important papers.  Meals: Start with liquid foods such as gelatin or soup. Progress to regular foods as tolerated. Avoid greasy, spicy, heavy foods. If nausea and/or vomiting occur, drink only clear liquids until the nausea and/or vomiting subsides. Call your physician if vomiting continues.  Special Instructions/Symptoms: Your throat may feel dry or   sore from the anesthesia or the breathing tube placed in your throat during surgery. If this causes discomfort, gargle with warm salt water. The discomfort should disappear within 24 hours.  If you had a scopolamine patch placed behind your ear for the management of post- operative nausea and/or vomiting:  1. The medication in the patch is effective for 72 hours, after which it should be removed.  Wrap patch in a tissue and discard in the trash. Wash hands thoroughly with soap and water. 2. You may remove the patch earlier than 72 hours if you experience unpleasant side effects which may include dry mouth, dizziness or visual disturbances. 3. Avoid touching the patch. Wash your hands with soap and water after  contact with the patch.     

## 2019-08-22 ENCOUNTER — Encounter: Payer: Self-pay | Admitting: *Deleted

## 2019-09-01 ENCOUNTER — Other Ambulatory Visit: Payer: Self-pay | Admitting: Cardiology

## 2019-09-11 ENCOUNTER — Ambulatory Visit (INDEPENDENT_AMBULATORY_CARE_PROVIDER_SITE_OTHER): Payer: Medicare Other | Admitting: Pharmacist

## 2019-09-11 ENCOUNTER — Other Ambulatory Visit: Payer: Self-pay

## 2019-09-11 DIAGNOSIS — Z5181 Encounter for therapeutic drug level monitoring: Secondary | ICD-10-CM | POA: Diagnosis not present

## 2019-09-11 DIAGNOSIS — I480 Paroxysmal atrial fibrillation: Secondary | ICD-10-CM

## 2019-09-11 DIAGNOSIS — Z7901 Long term (current) use of anticoagulants: Secondary | ICD-10-CM

## 2019-09-11 LAB — POCT INR: INR: 2.6 (ref 2.0–3.0)

## 2019-09-11 NOTE — Patient Instructions (Signed)
Continue 10mg  daily except 5mg  on Mondays, Wednesdays and Saturdays. Continue eating 2 servings of leafy green vegetables each week. Recheck INR in 6 weeks.

## 2019-10-16 ENCOUNTER — Other Ambulatory Visit: Payer: Self-pay

## 2019-10-16 ENCOUNTER — Ambulatory Visit (INDEPENDENT_AMBULATORY_CARE_PROVIDER_SITE_OTHER): Payer: Medicare Other | Admitting: Pharmacist

## 2019-10-16 DIAGNOSIS — I480 Paroxysmal atrial fibrillation: Secondary | ICD-10-CM

## 2019-10-16 DIAGNOSIS — Z7901 Long term (current) use of anticoagulants: Secondary | ICD-10-CM | POA: Diagnosis not present

## 2019-10-16 DIAGNOSIS — Z5181 Encounter for therapeutic drug level monitoring: Secondary | ICD-10-CM | POA: Diagnosis not present

## 2019-10-16 LAB — POCT INR: INR: 2.5 (ref 2.0–3.0)

## 2019-10-16 NOTE — Patient Instructions (Addendum)
Continue 10mg daily except 5mg on Mondays, Wednesdays and Saturdays. Continue eating 2 servings of leafy green vegetables each week. Recheck INR in 8 weeks.   

## 2019-10-31 ENCOUNTER — Telehealth: Payer: Self-pay | Admitting: Hematology and Oncology

## 2019-10-31 NOTE — Telephone Encounter (Signed)
Rescheduled appointment per 7/1 message. Left message on patient voicemail with updated appointment time and date.

## 2019-12-07 ENCOUNTER — Other Ambulatory Visit: Payer: Self-pay | Admitting: Cardiology

## 2019-12-18 ENCOUNTER — Ambulatory Visit (INDEPENDENT_AMBULATORY_CARE_PROVIDER_SITE_OTHER): Payer: Medicare Other

## 2019-12-18 ENCOUNTER — Other Ambulatory Visit: Payer: Self-pay

## 2019-12-18 DIAGNOSIS — I480 Paroxysmal atrial fibrillation: Secondary | ICD-10-CM

## 2019-12-18 DIAGNOSIS — Z5181 Encounter for therapeutic drug level monitoring: Secondary | ICD-10-CM

## 2019-12-18 DIAGNOSIS — Z7901 Long term (current) use of anticoagulants: Secondary | ICD-10-CM | POA: Diagnosis not present

## 2019-12-18 LAB — POCT INR: INR: 2.2 (ref 2.0–3.0)

## 2019-12-18 NOTE — Patient Instructions (Signed)
Continue 10mg  daily except 5mg  on Mondays, Wednesdays and Saturdays. Continue eating 2 servings of leafy green vegetables each week. Recheck INR in 8 weeks.

## 2019-12-20 ENCOUNTER — Telehealth: Payer: Self-pay | Admitting: *Deleted

## 2019-12-20 DIAGNOSIS — Z170 Estrogen receptor positive status [ER+]: Secondary | ICD-10-CM

## 2019-12-20 NOTE — Telephone Encounter (Signed)
Notified that we will schedule a lab appt for iron studies, ferritin and CBC, then see Dr Lindi Adie a few days later . Message to scheduler

## 2019-12-20 NOTE — Telephone Encounter (Signed)
Sabrina [name] called to say she saw Dr Wendie Agreste. They drew labs and told her that she is anemic. They wanted her to follow up with Dr Lindi Adie.   Dr Rayford Halsted office faxed labs results- For review by Dr Lindi Adie.

## 2019-12-21 ENCOUNTER — Telehealth: Payer: Self-pay | Admitting: Hematology and Oncology

## 2019-12-21 NOTE — Telephone Encounter (Signed)
Scheduled appt per 9/09 sch msg - pt is aware of appts.

## 2019-12-24 ENCOUNTER — Inpatient Hospital Stay: Payer: Medicare Other | Attending: Hematology and Oncology

## 2019-12-24 ENCOUNTER — Other Ambulatory Visit: Payer: Self-pay

## 2019-12-24 DIAGNOSIS — C50411 Malignant neoplasm of upper-outer quadrant of right female breast: Secondary | ICD-10-CM | POA: Insufficient documentation

## 2019-12-24 DIAGNOSIS — Z923 Personal history of irradiation: Secondary | ICD-10-CM | POA: Insufficient documentation

## 2019-12-24 DIAGNOSIS — Z79811 Long term (current) use of aromatase inhibitors: Secondary | ICD-10-CM | POA: Insufficient documentation

## 2019-12-24 DIAGNOSIS — D500 Iron deficiency anemia secondary to blood loss (chronic): Secondary | ICD-10-CM | POA: Insufficient documentation

## 2019-12-24 DIAGNOSIS — M8580 Other specified disorders of bone density and structure, unspecified site: Secondary | ICD-10-CM | POA: Diagnosis not present

## 2019-12-24 DIAGNOSIS — Z170 Estrogen receptor positive status [ER+]: Secondary | ICD-10-CM | POA: Diagnosis present

## 2019-12-24 DIAGNOSIS — Z79899 Other long term (current) drug therapy: Secondary | ICD-10-CM | POA: Insufficient documentation

## 2019-12-24 LAB — CBC WITH DIFFERENTIAL (CANCER CENTER ONLY)
Abs Immature Granulocytes: 0.02 10*3/uL (ref 0.00–0.07)
Basophils Absolute: 0.1 10*3/uL (ref 0.0–0.1)
Basophils Relative: 1 %
Eosinophils Absolute: 0.2 10*3/uL (ref 0.0–0.5)
Eosinophils Relative: 3 %
HCT: 28.6 % — ABNORMAL LOW (ref 36.0–46.0)
Hemoglobin: 8.4 g/dL — ABNORMAL LOW (ref 12.0–15.0)
Immature Granulocytes: 0 %
Lymphocytes Relative: 18 %
Lymphs Abs: 1.4 10*3/uL (ref 0.7–4.0)
MCH: 23.7 pg — ABNORMAL LOW (ref 26.0–34.0)
MCHC: 29.4 g/dL — ABNORMAL LOW (ref 30.0–36.0)
MCV: 80.6 fL (ref 80.0–100.0)
Monocytes Absolute: 0.5 10*3/uL (ref 0.1–1.0)
Monocytes Relative: 6 %
Neutro Abs: 5.9 10*3/uL (ref 1.7–7.7)
Neutrophils Relative %: 72 %
Platelet Count: 267 10*3/uL (ref 150–400)
RBC: 3.55 MIL/uL — ABNORMAL LOW (ref 3.87–5.11)
RDW: 15.6 % — ABNORMAL HIGH (ref 11.5–15.5)
WBC Count: 8.1 10*3/uL (ref 4.0–10.5)
nRBC: 0 % (ref 0.0–0.2)

## 2019-12-24 LAB — IRON AND TIBC
Iron: 13 ug/dL — ABNORMAL LOW (ref 41–142)
Saturation Ratios: 3 % — ABNORMAL LOW (ref 21–57)
TIBC: 520 ug/dL — ABNORMAL HIGH (ref 236–444)
UIBC: 507 ug/dL — ABNORMAL HIGH (ref 120–384)

## 2019-12-24 LAB — FERRITIN: Ferritin: <4 ng/mL — ABNORMAL LOW (ref 11–307)

## 2019-12-25 NOTE — Progress Notes (Signed)
Patient Care Team: Enid [name]., MD as PCP - General (Family Medicine) Erroll Luna, MD as Consulting Physician (General Surgery) Nicholas Lose, MD as Consulting Physician (Hematology and Oncology) Eppie Gibson, MD as Attending Physician (Radiation Oncology) Park Liter, MD as Consulting Physician (Cardiology)  DIAGNOSIS:    ICD-10-CM   1. Malignant neoplasm of upper-outer quadrant of right breast in female, estrogen receptor positive ([place])  C50.411    Z17.0   2. Iron deficiency anemia due to chronic blood loss  D50.0     SUMMARY OF ONCOLOGIC HISTORY: Oncology History  Malignant neoplasm of upper-outer quadrant of right breast in female, estrogen receptor positive ([place])  06/19/2018 Initial Diagnosis   Screening mammogram detected right breast mass at 9 o'clock position 3 cm from the nipple, ultrasound revealed 7 x 5 x 5 mm mass, axillary ultrasound normal-appearing lymph nodes, ultrasound biopsy: Grade 1 IDC ER 100%, PR 10%, Ki-67 10%, HER-2 3+ positive, T1b N0 stage Ia   06/28/2018 Cancer Staging   Staging form: Breast, AJCC 8th Edition - Clinical stage from 06/28/2018: Stage IA (cT1b, cN0, cM0, G1, ER+, PR+, HER2+) - Signed by Nicholas Lose, MD on 06/28/2018   07/05/2018 Genetic Testing   Negative genetic testing on the common hereditary cancer panel.  The Hereditary Gene Panel offered by [facility] includes sequencing and/or deletion duplication testing of the following 48 genes: APC, ATM, AXIN2, BARD1, BMPR1A, BRCA1, BRCA2, BRIP1, CDH1, CDK4, CDKN2A (p14ARF), CDKN2A (p16INK4a), CHEK2, CTNNA1, DICER1, EPCAM (Deletion/duplication testing only), GREM1 (promoter region deletion/duplication testing only), KIT, MEN1, MLH1, MSH2, MSH3, MSH6, MUTYH, NBN, NF1, NHTL1, PALB2, PDGFRA, PMS2, POLD1, POLE, PTEN, RAD50, RAD51C, RAD51D, RNF43, SDHB, SDHC, SDHD, SMAD4, SMARCA4. STK11, TP53, TSC1, TSC2, and VHL.  The following genes were evaluated for sequence changes only: SDHA and HOXB13  c.251G>A variant only. The report date is July 05, 2018.   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    08/17/2018 - 07/11/2019 Chemotherapy   Taxol Herceptin followed by Herceptin maintenance   12/05/2018 - 01/02/2019 Radiation Therapy   Adjuvant XRT   01/25/2019 -  Anti-estrogen oral therapy   Anastrozole 94m daily, plan 5-7 years   07/13/2019 Cancer Staging   Staging form: Breast, AJCC 8th Edition - Pathologic stage from 07/13/2019: Stage IA (pT1b, pN0, cM0, G2, ER+, PR+, HER2+) - Signed by CGardenia Phlegm NP on 08/01/2019     CHIEF COMPLIANT: Follow-upof right breast cancer on anastrozole   INTERVAL HISTORY: DRafia Sheddenis a 69y.o. with above-mentioned history of right breast cancer who underwent a lumpectomy, adjuvant chemotherapy, radiation, Herceptin maintenance,and is currently onanti-estrogen therapywith anastrozole.Bone density scan on 08/14/19 showed osteopenia with a T-score of -2.0 at the left femur. She presents to the clinic todayfor follow-up.   She is here primarily because of iron deficiency anemia.  She has not been feeling great for the past couple of months.  [place] and she initially thought it was depression because it was the anniversary of her husband's passing but later on she realized that it might be her anemia.  She went to her primary care doctor who performed blood work that showed severe anemia and she was asked to see [place]  She has not noticed any blood loss in the stool.  She has however not had a colonoscopy or  endoscopy over the past 10 years.  She feels extremely tired and weak and has some shortness of breath to minimal exertion.  Denies any dizziness or palpitations.  ALLERGIES:  is allergic to atorvastatin and meperidine.  MEDICATIONS:  Current Outpatient Medications    Medication Sig Dispense Refill  . anastrozole (ARIMIDEX) 1 MG tablet Take 1 tablet (1 mg total) by mouth daily. 90 tablet 3  . digoxin (LANOXIN) 0.125 MG tablet TAKE 1 TABLET DAILY 90 tablet 1  . flecainide (TAMBOCOR) 100 MG tablet TAKE 1 TABLET TWICE A DAY 180 tablet 2  . ibuprofen (ADVIL) 800 MG tablet Take 1 tablet (800 mg total) by mouth every 8 (eight) hours as needed. 30 tablet 0  . levothyroxine (SYNTHROID, LEVOTHROID) 100 MCG tablet Take 1 tablet by mouth daily.    Marland Kitchen lisinopril (PRINIVIL,ZESTRIL) 10 MG tablet Take 1 tablet (10 mg total) by mouth daily. 90 tablet 3  . metFORMIN (GLUCOPHAGE) 500 MG tablet Take 1 tablet by mouth 2 (two) times daily with a meal.     . metoprolol succinate (TOPROL-XL) 50 MG 24 hr tablet TAKE 1 TABLET DAILY (Patient taking differently: in the morning and at bedtime. ) 90 tablet 3  . warfarin (COUMADIN) 10 MG tablet TAKE ONE-HALF (1/2) TO ONE TABLET AS DIRECTED BY COUMADIN CLINIC 90 tablet 1   No current facility-administered medications for this visit.    PHYSICAL EXAMINATION: ECOG PERFORMANCE STATUS: 2 - Symptomatic, <50% confined to bed  Vitals:   12/26/19 0952  BP: 128/65  Pulse: 82  Resp: 18  Temp: 97.7 F (36.5 C)  SpO2: 98%   Filed Weights   12/26/19 0952  Weight: 167 lb 11.2 oz (76.1 kg)      LABORATORY DATA:  I have reviewed the data as listed CMP Latest Ref Rng & Units 08/17/2019 07/11/2019 05/30/2019  Glucose 70 - 99 mg/dL 153(H) 134(H) 128(H)  BUN 8 - 23 mg/dL _0 Creatinine 0.44 - 1.00 mg/dL 0.54 0.66 0.66  Sodium 135 - 145 mmol/L 137 141 140  Potassium 3.5 - 5.1 mmol/L 4.3 4.1 4.3  Chloride 98 - 111 mmol/L 99 103 101  CO2 22 - 32 mmol/L _1 Calcium 8.9 - 10.3 mg/dL 9.2 9.2 9.2  Total Protein 6.5 - 8.1 g/dL 6.3(L) 6.6 7.1  Total Bilirubin 0.3 - 1.2 mg/dL 0.4 <0.2(L) <0.2(L)  Alkaline Phos 38 - 126 U/L 65 68 82  AST 15 - 41 U/L 16 14(L) 15  ALT 0 - 44 U/L _2 Lab Results  Component Value Date   WBC  8.1 12/24/2019   HGB 8.4 (L) 12/24/2019   HCT 28.6 (L) 12/24/2019   MCV 80.6 12/24/2019   PLT 267 12/24/2019   NEUTROABS 5.9 12/24/2019    ASSESSMENT & PLAN:  Malignant neoplasm of upper-outer quadrant of right breast in female, estrogen receptor positive ([place]) 07/13/2018:Right lumpectomy ([facility]): IDC with DCIS, 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  Treatment plan: 1.Adj chemo with Taxol-Herceptin weekly X 12completed 7/23/2020and then q 3 weeks Herceptin.  Completed 07/11/2019 2.Adjuvant radiation therapystarted 12/05/2018-01/02/19 3.Followed by adjuvant antiestrogen therapy with Anastrozole in 01/2019 ----------------------------------------------------------------------------------------------------------------------------------------------- Current treatment: Adjuvant anastrozole Anastrozole toxicities: Tolerating it fairly well Breast cancer surveillance: Mammogram 06/15/2019: Benign breast density category C Bone density 08/14/2019: T score -2: Osteopenia  Iron deficiency anemia due to chronic blood loss Prior iron infusion: February 2021 Lab review: 12/24/2019: Hemoglobin 8.4,  MCV 80.6, RDW 15.6, ferritin less than 4, iron saturation 3%, TIBC 520 Patient requires IV iron once again. I also recommended that she see her gastroenterologist in [place].  She might need another endoscopy colonoscopy.  Return to clinic in 3 months with labs and follow-up.    No orders of the defined types were placed in this encounter.  The patient has a good understanding of the overall plan. she agrees with it. she will call with any problems that may develop before the next visit here.  Total time spent: 30 mins including face to face time and time spent for planning, charting and coordination of care  Nicholas Lose, MD 12/26/2019  I, Cloyde Reams [name], am acting as scribe for Dr. Nicholas Lose.  I have reviewed the above documentation  for accuracy and completeness, and I agree with the above.

## 2019-12-26 ENCOUNTER — Other Ambulatory Visit: Payer: Self-pay | Admitting: Hematology and Oncology

## 2019-12-26 ENCOUNTER — Ambulatory Visit: Payer: Medicare Other

## 2019-12-26 ENCOUNTER — Inpatient Hospital Stay (HOSPITAL_BASED_OUTPATIENT_CLINIC_OR_DEPARTMENT_OTHER): Payer: Medicare Other | Admitting: Hematology and Oncology

## 2019-12-26 ENCOUNTER — Other Ambulatory Visit: Payer: Self-pay

## 2019-12-26 DIAGNOSIS — C50411 Malignant neoplasm of upper-outer quadrant of right female breast: Secondary | ICD-10-CM

## 2019-12-26 DIAGNOSIS — Z170 Estrogen receptor positive status [ER+]: Secondary | ICD-10-CM | POA: Diagnosis not present

## 2019-12-26 DIAGNOSIS — D500 Iron deficiency anemia secondary to blood loss (chronic): Secondary | ICD-10-CM

## 2019-12-26 HISTORY — DX: Iron deficiency anemia secondary to blood loss (chronic): D50.0

## 2019-12-26 MED ORDER — DIPHENHYDRAMINE HCL 25 MG PO CAPS
50.0000 mg | ORAL_CAPSULE | Freq: Once | ORAL | Status: AC
  Administered 2019-12-26: 50 mg via ORAL

## 2019-12-26 MED ORDER — SODIUM CHLORIDE 0.9 % IV SOLN
Freq: Once | INTRAVENOUS | Status: AC
  Filled 2019-12-26: qty 250

## 2019-12-26 MED ORDER — DIPHENHYDRAMINE HCL 25 MG PO CAPS
ORAL_CAPSULE | ORAL | Status: AC
  Filled 2019-12-26: qty 2

## 2019-12-26 MED ORDER — ACETAMINOPHEN 325 MG PO TABS
650.0000 mg | ORAL_TABLET | Freq: Once | ORAL | Status: AC
  Administered 2019-12-26: 650 mg via ORAL

## 2019-12-26 MED ORDER — SODIUM CHLORIDE 0.9 % IV SOLN
300.0000 mg | Freq: Once | INTRAVENOUS | Status: AC
  Administered 2019-12-26: 300 mg via INTRAVENOUS
  Filled 2019-12-26: qty 10

## 2019-12-26 MED ORDER — ACETAMINOPHEN 325 MG PO TABS
ORAL_TABLET | ORAL | Status: AC
  Filled 2019-12-26: qty 2

## 2019-12-26 NOTE — Patient Instructions (Signed)

## 2019-12-26 NOTE — Assessment & Plan Note (Signed)
Prior iron infusion: February 2021 Lab review: 12/24/2019: Hemoglobin 8.4, MCV 80.6, RDW 15.6, ferritin less than 4, iron saturation 3%, TIBC 520 Patient requires IV iron once again. Return to clinic in 3 months with labs and follow-up.

## 2019-12-26 NOTE — Progress Notes (Signed)
Patient received premedications in past with Venofer, MD would like to add to current treatments.  Tylenol 650 mg po X 1 Diphenhydramine 50 mg po x 1  Orders updated.  T.O. Dr Jannifer Hick, PharmD

## 2019-12-26 NOTE — Assessment & Plan Note (Signed)
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  Treatment plan: 1.Adj chemo with Taxol-Herceptin weekly X 12completed 7/23/2020and then q 3 weeks Herceptin.  Completed 07/11/2019 2.Adjuvant radiation therapystarted 12/05/2018-01/02/19 3.Followed by adjuvant antiestrogen therapy with Anastrozole in 01/2019 ----------------------------------------------------------------------------------------------------------------------------------------------- Current treatment: Adjuvant anastrozole Anastrozole toxicities: Tolerating it fairly well Breast cancer surveillance: Mammogram 06/15/2019: Benign breast density category C Bone density 08/14/2019: T score -2: Osteopenia 

## 2019-12-31 ENCOUNTER — Other Ambulatory Visit: Payer: Self-pay

## 2019-12-31 ENCOUNTER — Inpatient Hospital Stay: Payer: Medicare Other

## 2019-12-31 VITALS — BP 127/58 | HR 65 | Temp 98.6°F | Resp 18

## 2019-12-31 DIAGNOSIS — D500 Iron deficiency anemia secondary to blood loss (chronic): Secondary | ICD-10-CM | POA: Diagnosis not present

## 2019-12-31 DIAGNOSIS — Z170 Estrogen receptor positive status [ER+]: Secondary | ICD-10-CM

## 2019-12-31 MED ORDER — SODIUM CHLORIDE 0.9 % IV SOLN
Freq: Once | INTRAVENOUS | Status: AC
  Filled 2019-12-31: qty 250

## 2019-12-31 MED ORDER — SODIUM CHLORIDE 0.9 % IV SOLN
300.0000 mg | Freq: Once | INTRAVENOUS | Status: AC
  Administered 2019-12-31: 300 mg via INTRAVENOUS
  Filled 2019-12-31: qty 10

## 2019-12-31 MED ORDER — ACETAMINOPHEN 325 MG PO TABS
ORAL_TABLET | ORAL | Status: AC
  Filled 2019-12-31: qty 2

## 2019-12-31 MED ORDER — DIPHENHYDRAMINE HCL 25 MG PO CAPS
ORAL_CAPSULE | ORAL | Status: AC
  Filled 2019-12-31: qty 2

## 2019-12-31 MED ORDER — DIPHENHYDRAMINE HCL 25 MG PO CAPS
50.0000 mg | ORAL_CAPSULE | Freq: Once | ORAL | Status: AC
  Administered 2019-12-31: 50 mg via ORAL

## 2019-12-31 MED ORDER — ACETAMINOPHEN 325 MG PO TABS
650.0000 mg | ORAL_TABLET | Freq: Once | ORAL | Status: AC
  Administered 2019-12-31: 650 mg via ORAL

## 2019-12-31 NOTE — Patient Instructions (Signed)

## 2020-01-02 ENCOUNTER — Other Ambulatory Visit: Payer: Self-pay

## 2020-01-02 ENCOUNTER — Inpatient Hospital Stay: Payer: Medicare Other

## 2020-01-02 VITALS — BP 129/62 | HR 68 | Temp 98.6°F | Resp 18

## 2020-01-02 DIAGNOSIS — Z170 Estrogen receptor positive status [ER+]: Secondary | ICD-10-CM

## 2020-01-02 DIAGNOSIS — D500 Iron deficiency anemia secondary to blood loss (chronic): Secondary | ICD-10-CM

## 2020-01-02 MED ORDER — SODIUM CHLORIDE 0.9 % IV SOLN
300.0000 mg | Freq: Once | INTRAVENOUS | Status: AC
  Administered 2020-01-02: 300 mg via INTRAVENOUS
  Filled 2020-01-02: qty 10

## 2020-01-02 MED ORDER — SODIUM CHLORIDE 0.9 % IV SOLN
Freq: Once | INTRAVENOUS | Status: AC
  Filled 2020-01-02: qty 250

## 2020-01-02 MED ORDER — DIPHENHYDRAMINE HCL 25 MG PO CAPS
50.0000 mg | ORAL_CAPSULE | Freq: Once | ORAL | Status: AC
  Administered 2020-01-02: 50 mg via ORAL

## 2020-01-02 MED ORDER — ACETAMINOPHEN 325 MG PO TABS
650.0000 mg | ORAL_TABLET | Freq: Once | ORAL | Status: AC
  Administered 2020-01-02: 650 mg via ORAL

## 2020-01-02 MED ORDER — ACETAMINOPHEN 325 MG PO TABS
ORAL_TABLET | ORAL | Status: AC
  Filled 2020-01-02: qty 2

## 2020-01-02 MED ORDER — DIPHENHYDRAMINE HCL 25 MG PO CAPS
ORAL_CAPSULE | ORAL | Status: AC
  Filled 2020-01-02: qty 2

## 2020-01-02 NOTE — Patient Instructions (Signed)

## 2020-01-28 NOTE — Progress Notes (Signed)
Patient Care Team: Enid Skeens., MD as PCP - General (Family Medicine) Erroll Luna, MD as Consulting Physician (General Surgery) Nicholas Lose, MD as Consulting Physician (Hematology and Oncology) Eppie Gibson, MD as Attending Physician (Radiation Oncology) Park Liter, MD as Consulting Physician (Cardiology)  DIAGNOSIS:    ICD-10-CM   1. Malignant neoplasm of upper-outer quadrant of right breast in female, estrogen receptor positive ([place])  C50.411    Z17.0   2. Iron deficiency anemia due to chronic blood loss  D50.0     SUMMARY OF ONCOLOGIC HISTORY: Oncology History  Malignant neoplasm of upper-outer quadrant of right breast in female, estrogen receptor positive ([place])  06/19/2018 Initial Diagnosis   Screening mammogram detected right breast mass at 9 o'clock position 3 cm from the nipple, ultrasound revealed 7 x 5 x 5 mm mass, axillary ultrasound normal-appearing lymph nodes, ultrasound biopsy: Grade 1 IDC ER 100%, PR 10%, Ki-67 10%, HER-2 3+ positive, T1b N0 stage Ia   06/28/2018 Cancer Staging   Staging form: Breast, AJCC 8th Edition - Clinical stage from 06/28/2018: Stage IA (cT1b, cN0, cM0, G1, ER+, PR+, HER2+) - Signed by Nicholas Lose, MD on 06/28/2018   07/05/2018 Genetic Testing   Negative genetic testing on the common hereditary cancer panel.  The Hereditary Gene Panel offered by [facility] includes sequencing and/or deletion duplication testing of the following 48 genes: APC, ATM, AXIN2, BARD1, BMPR1A, BRCA1, BRCA2, BRIP1, CDH1, CDK4, CDKN2A (p14ARF), CDKN2A (p16INK4a), CHEK2, CTNNA1, DICER1, EPCAM (Deletion/duplication testing only), GREM1 (promoter region deletion/duplication testing only), KIT, MEN1, MLH1, MSH2, MSH3, MSH6, MUTYH, NBN, NF1, NHTL1, PALB2, PDGFRA, PMS2, POLD1, POLE, PTEN, RAD50, RAD51C, RAD51D, RNF43, SDHB, SDHC, SDHD, SMAD4, SMARCA4. STK11, TP53, TSC1, TSC2, and VHL.  The following genes were evaluated for sequence changes only: SDHA and HOXB13  c.251G>A variant only. The report date is July 05, 2018.   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    08/17/2018 - 07/11/2019 Chemotherapy   Taxol Herceptin followed by Herceptin maintenance   12/05/2018 - 01/02/2019 Radiation Therapy   Adjuvant XRT   01/25/2019 -  Anti-estrogen oral therapy   Anastrozole 55m daily, plan 5-7 years   07/13/2019 Cancer Staging   Staging form: Breast, AJCC 8th Edition - Pathologic stage from 07/13/2019: Stage IA (pT1b, pN0, cM0, G2, ER+, PR+, HER2+) - Signed by CGardenia Phlegm NP on 08/01/2019     CHIEF COMPLIANT: Follow-upof iron deficiency anemia  INTERVAL HISTORY: DYanelis [name] a 69y.o. with above-mentioned history of right breast cancer who underwent a lumpectomy, adjuvant chemotherapy, radiation, Herceptin maintenance,and is currently onanti-estrogen therapywith anastrozole. She also has a history of severe iron deficiency anemia, treated with IV iron (last 01/02/20). She presents to the clinic todayfor follow-up.   After the IV iron she has felt remarkably better.  ALLERGIES:  is allergic to atorvastatin and meperidine.  MEDICATIONS:  Current Outpatient Medications  Medication Sig Dispense Refill  . anastrozole (ARIMIDEX) 1 MG tablet Take 1 tablet (1 mg total) by mouth daily. 90 tablet 3  . digoxin (LANOXIN) 0.125 MG tablet TAKE 1 TABLET DAILY 90 tablet 1  . flecainide (TAMBOCOR) 100 MG tablet TAKE 1 TABLET TWICE A DAY 180 tablet 2  . ibuprofen (ADVIL) 800 MG tablet Take 1 tablet (800 mg total) by mouth every 8 (eight) hours as needed. 30 tablet 0  . levothyroxine (SYNTHROID, LEVOTHROID) 100 MCG tablet  Take 1 tablet by mouth daily.    Marland Kitchen lisinopril (PRINIVIL,ZESTRIL) 10 MG tablet Take 1 tablet (10 mg total) by mouth daily. 90 tablet 3  . metFORMIN (GLUCOPHAGE) 500 MG tablet Take 1 tablet by mouth 2 (two) times daily with a  meal.     . metoprolol succinate (TOPROL-XL) 50 MG 24 hr tablet TAKE 1 TABLET DAILY (Patient taking differently: in the morning and at bedtime. ) 90 tablet 3  . warfarin (COUMADIN) 10 MG tablet TAKE ONE-HALF (1/2) TO ONE TABLET AS DIRECTED BY COUMADIN CLINIC 90 tablet 1   No current facility-administered medications for this visit.    PHYSICAL EXAMINATION: ECOG PERFORMANCE STATUS: 1 - Symptomatic but completely ambulatory  Vitals:   01/29/20 1442  BP: 126/65  Pulse: 86  Resp: 18  Temp: (!) 97.4 F (36.3 C)  SpO2: 97%   Filed Weights   01/29/20 1442  Weight: 167 lb 6.4 oz (75.9 kg)    LABORATORY DATA:  I have reviewed the data as listed CMP Latest Ref Rng & Units 08/17/2019 07/11/2019 05/30/2019  Glucose 70 - 99 mg/dL 153(H) 134(H) 128(H)  BUN 8 - 23 mg/dL _0 Creatinine 0.44 - 1.00 mg/dL 0.54 0.66 0.66  Sodium 135 - 145 mmol/L 137 141 140  Potassium 3.5 - 5.1 mmol/L 4.3 4.1 4.3  Chloride 98 - 111 mmol/L 99 103 101  CO2 22 - 32 mmol/L _1 Calcium 8.9 - 10.3 mg/dL 9.2 9.2 9.2  Total Protein 6.5 - 8.1 g/dL 6.3(L) 6.6 7.1  Total Bilirubin 0.3 - 1.2 mg/dL 0.4 <0.2(L) <0.2(L)  Alkaline Phos 38 - 126 U/L 65 68 82  AST 15 - 41 U/L 16 14(L) 15  ALT 0 - 44 U/L _2 Lab Results  Component Value Date   WBC 8.1 12/24/2019   HGB 8.4 (L) 12/24/2019   HCT 28.6 (L) 12/24/2019   MCV 80.6 12/24/2019   PLT 267 12/24/2019   NEUTROABS 5.9 12/24/2019    ASSESSMENT & PLAN:  Malignant neoplasm of upper-outer quadrant of right breast in female, estrogen receptor positive ([place]) 07/13/2018:Right lumpectomy ([facility]): IDC with DCIS, 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  Treatment plan: 1.Adj chemo with Taxol-Herceptin weekly X 12completed 7/23/2020and then q 3 weeks Herceptin.  Completed 07/11/2019 2.Adjuvant radiation therapystarted 12/05/2018-01/02/19 3.Followed by adjuvant antiestrogen therapywith Anastrozole in  01/2019 ----------------------------------------------------------------------------------------------------------------------------------------------- Current treatment: Adjuvant anastrozole Anastrozole toxicities: Tolerating it fairly well Breast cancer surveillance: Mammogram 06/15/2019: Benign breast density category C Bone density 08/14/2019: T score -2: Osteopenia  Iron deficiency anemia due to chronic blood loss Prior iron infusion: February 2021, September 2021 Lab review:  12/24/2019: Hemoglobin 8.4, MCV 80.6, RDW 15.6, ferritin less than 4, iron saturation 3%, TIBC 520 She is going to have blood work done with her gastroenterologist in [place] on November 2.  Therefore we are not doing any blood work today. The plan is for her to undergo upper endoscopy, colonoscopy, celiac antibody testing and iron studies. Patient does not want to go as low as she did previously.  She is 80% better clinically. We will see her back in December to see if she needs additional IV iron.  No orders of the defined types were placed in this encounter.  The patient has a good understanding of the overall plan. she agrees with it. she will call with any problems that may develop before the next visit here.  Total time spent: 30 mins including face to face time and time spent for planning, charting and coordination of care  Nicholas Lose, MD 01/29/2020  I, Cloyde Reams [place], am acting as scribe for Dr. Nicholas Lose.  I have reviewed the above documentation for accuracy and completeness, and I agree with the above.

## 2020-01-29 ENCOUNTER — Other Ambulatory Visit: Payer: Self-pay

## 2020-01-29 ENCOUNTER — Telehealth: Payer: Self-pay | Admitting: Emergency Medicine

## 2020-01-29 ENCOUNTER — Inpatient Hospital Stay: Payer: Medicare Other | Attending: Hematology and Oncology | Admitting: Hematology and Oncology

## 2020-01-29 DIAGNOSIS — D500 Iron deficiency anemia secondary to blood loss (chronic): Secondary | ICD-10-CM

## 2020-01-29 DIAGNOSIS — Z7901 Long term (current) use of anticoagulants: Secondary | ICD-10-CM | POA: Diagnosis not present

## 2020-01-29 DIAGNOSIS — Z791 Long term (current) use of non-steroidal anti-inflammatories (NSAID): Secondary | ICD-10-CM | POA: Diagnosis not present

## 2020-01-29 DIAGNOSIS — Z7984 Long term (current) use of oral hypoglycemic drugs: Secondary | ICD-10-CM | POA: Diagnosis not present

## 2020-01-29 DIAGNOSIS — Z79899 Other long term (current) drug therapy: Secondary | ICD-10-CM | POA: Insufficient documentation

## 2020-01-29 DIAGNOSIS — C50411 Malignant neoplasm of upper-outer quadrant of right female breast: Secondary | ICD-10-CM | POA: Diagnosis present

## 2020-01-29 DIAGNOSIS — Z170 Estrogen receptor positive status [ER+]: Secondary | ICD-10-CM

## 2020-01-29 NOTE — Assessment & Plan Note (Signed)
Prior iron infusion: February 2021, September 2021 Lab review:  12/24/2019: Hemoglobin 8.4, MCV 80.6, RDW 15.6, ferritin less than 4, iron saturation 3%, TIBC 520  I also recommended that she see her gastroenterologist in [place].  She might need another endoscopy colonoscopy.

## 2020-01-29 NOTE — Assessment & Plan Note (Signed)
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  Treatment plan: 1.Adj chemo with Taxol-Herceptin weekly X 12completed 7/23/2020and then q 3 weeks Herceptin.  Completed 07/11/2019 2.Adjuvant radiation therapystarted 12/05/2018-01/02/19 3.Followed by adjuvant antiestrogen therapy with Anastrozole in 01/2019 ----------------------------------------------------------------------------------------------------------------------------------------------- Current treatment: Adjuvant anastrozole Anastrozole toxicities: Tolerating it fairly well Breast cancer surveillance: Mammogram 06/15/2019: Benign breast density category C Bone density 08/14/2019: T score -2: Osteopenia 

## 2020-01-29 NOTE — Telephone Encounter (Signed)
   [facility] Pre-operative Risk Assessment    HEARTCARE STAFF: - Please ensure there is not already an duplicate clearance open for this procedure. - Under Visit Info/Reason for Call, type in Other and utilize the format Clearance MM/DD/YY or Clearance TBD. Do not use dashes or single digits. - If request is for dental extraction, please clarify the # of teeth to be extracted.  Request for surgical clearance:  1. What type of surgery is being performed? Colonoscopy/ egd    2. When is this surgery scheduled? 02/08/20   3. What type of clearance is required (medical clearance vs. Pharmacy clearance to hold med vs. Both)? both  4. Are there any medications that need to be held prior to surgery and how long? Coumadin 5 days , resume night of procedure at same dose    5. Practice name and name of physician performing surgery? [facility] Dr. Nehemiah Settle   6. What is the office phone number? [phone]   7.   What is the office fax number? [phone]  8.   Anesthesia type (None, local, MAC, general) ? Not specified    Sabrina [name] 01/29/2020, 4:55 PM  _________________________________________________________________   (provider comments below)

## 2020-01-29 NOTE — Telephone Encounter (Signed)
She has previously been cleared to hold coumadin for 5 days. Is this still the recommendation for colonoscopy?

## 2020-01-30 ENCOUNTER — Telehealth: Payer: Self-pay | Admitting: Hematology and Oncology

## 2020-01-30 NOTE — Telephone Encounter (Signed)
Colonoscopy is now Nov 3rd. Called pt and moved her next INR check to the following Tuesday in [place]. She is aware of 5 day hold prior to procedure and will resume Coumadin Nov 3 in the PM after her procedure.

## 2020-01-30 NOTE — Telephone Encounter (Signed)
Patient with diagnosis of afib on warfarin for anticoagulation.    Procedure: colonoscopy/EGD Date of procedure: 02/08/20   :[id number]}  CHA2DS2-VASc Score = 4  This indicates a 4.8% annual risk of stroke. The patient's score is based upon: CHF History: 0 HTN History: 1 Diabetes History: 1 Stroke History: 0 Vascular Disease History: 0 Age Score: 1 Gender Score: 1  CrCl >181mL/min Platelet count 267K  Per office protocol, patient can hold warfarin for 5 days prior to procedure.

## 2020-01-30 NOTE — Telephone Encounter (Signed)
   Primary Cardiologist: Jenne Campus, MD  Chart reviewed as part of pre-operative protocol coverage. Patient was contacted 01/30/2020 in reference to pre-operative risk assessment for pending surgery as outlined below.  Sabrina [name] was last seen on 02/21/19 by Dr. Agustin Cree.  Since that day, Sabrina [name] has done well.  She does not have a history of MI or stroke. She can complete more than 4.0 METS without angina.   Per our clinical pharmacist: Patient with diagnosis of afib on warfarin for anticoagulation.    Procedure: colonoscopy/EGD Date of procedure: 02/08/20   :[id number]}  CHA2DS2-VASc Score = 4  This indicates a 4.8% annual risk of stroke. The patient's score is based upon: CHF History: 0 HTN History: 1 Diabetes History: 1 Stroke History: 0 Vascular Disease History: 0 Age Score: 1 Gender Score: 1  CrCl >169mL/min Platelet count 267K  Per office protocol, patient can hold warfarin for 5 days prior to procedure  Therefore, based on ACC/AHA guidelines, the patient would be at acceptable risk for the planned procedure without further cardiovascular testing.   The patient was advised that if she develops new symptoms prior to surgery to contact our office to arrange for a follow-up visit, and she verbalized understanding.  I will route this recommendation to the requesting party via Epic fax function and remove from pre-op pool. Please call with questions.  [place], PA 01/30/2020, 8:08 AM

## 2020-01-30 NOTE — Telephone Encounter (Signed)
No 10/19 los, no changes made to pt schedule  °

## 2020-02-05 ENCOUNTER — Ambulatory Visit: Payer: Medicare Other | Admitting: Hematology and Oncology

## 2020-02-06 ENCOUNTER — Telehealth: Payer: Self-pay | Admitting: *Deleted

## 2020-02-06 NOTE — Telephone Encounter (Signed)
Received call from pt stating she will have iron studies drawn on 02/12/20 with gastroenterologist.   Pt requesting MD direct fax number to provide to GI to send labs directly once results are in.  RN provided pt with office fax number [phone].

## 2020-02-07 ENCOUNTER — Ambulatory Visit: Payer: Medicare Other | Admitting: Hematology and Oncology

## 2020-02-13 ENCOUNTER — Ambulatory Visit: Payer: Medicare Other | Admitting: Cardiology

## 2020-02-14 ENCOUNTER — Other Ambulatory Visit: Payer: Self-pay | Admitting: Unknown Physician Specialty

## 2020-02-14 ENCOUNTER — Telehealth: Payer: Self-pay | Admitting: *Deleted

## 2020-02-14 DIAGNOSIS — K56699 Other intestinal obstruction unspecified as to partial versus complete obstruction: Secondary | ICD-10-CM

## 2020-02-14 NOTE — Telephone Encounter (Signed)
Received call from pt stating her provider in [place] Dr. Melina Copa has arranged for her to receive IV iron starting Monday 02/18/20.  Pt wanting MD to be aware.  MD notified and verbalized understanding and is agreeable with plan of care.  Pt will f/u in December with MD with labs prior to visit.  Pt verbalized understanding of future apt date and times.

## 2020-02-19 ENCOUNTER — Other Ambulatory Visit: Payer: Self-pay

## 2020-02-19 ENCOUNTER — Ambulatory Visit (INDEPENDENT_AMBULATORY_CARE_PROVIDER_SITE_OTHER): Payer: Medicare Other

## 2020-02-19 DIAGNOSIS — Z9221 Personal history of antineoplastic chemotherapy: Secondary | ICD-10-CM | POA: Insufficient documentation

## 2020-02-19 DIAGNOSIS — I499 Cardiac arrhythmia, unspecified: Secondary | ICD-10-CM | POA: Insufficient documentation

## 2020-02-19 DIAGNOSIS — E119 Type 2 diabetes mellitus without complications: Secondary | ICD-10-CM | POA: Insufficient documentation

## 2020-02-19 DIAGNOSIS — I480 Paroxysmal atrial fibrillation: Secondary | ICD-10-CM | POA: Diagnosis not present

## 2020-02-19 DIAGNOSIS — Z7901 Long term (current) use of anticoagulants: Secondary | ICD-10-CM | POA: Diagnosis not present

## 2020-02-19 DIAGNOSIS — Z5181 Encounter for therapeutic drug level monitoring: Secondary | ICD-10-CM

## 2020-02-19 DIAGNOSIS — E785 Hyperlipidemia, unspecified: Secondary | ICD-10-CM | POA: Insufficient documentation

## 2020-02-19 DIAGNOSIS — Z923 Personal history of irradiation: Secondary | ICD-10-CM

## 2020-02-19 LAB — POCT INR: INR: 2.3 (ref 2.0–3.0)

## 2020-02-19 NOTE — Patient Instructions (Signed)
Continue 10mg  daily except 5mg  on Mondays, Wednesdays and Saturdays.  Recheck INR in 8 weeks.

## 2020-02-20 ENCOUNTER — Ambulatory Visit: Payer: Medicare Other | Admitting: Cardiology

## 2020-03-03 ENCOUNTER — Other Ambulatory Visit: Payer: Self-pay | Admitting: Cardiology

## 2020-03-03 NOTE — Telephone Encounter (Signed)
Rx refill sent to pharmacy with instructions to arrange follow up with Dr. Agustin Cree. Last visit 02/2019

## 2020-03-13 ENCOUNTER — Ambulatory Visit
Admission: RE | Admit: 2020-03-13 | Discharge: 2020-03-13 | Disposition: A | Discharge status: Home / Self Care | Payer: Medicare Other | Source: Ambulatory Visit | Attending: Unknown Physician Specialty | Admitting: Unknown Physician Specialty

## 2020-03-13 DIAGNOSIS — K56699 Other intestinal obstruction unspecified as to partial versus complete obstruction: Secondary | ICD-10-CM

## 2020-03-18 ENCOUNTER — Other Ambulatory Visit: Payer: Self-pay | Admitting: *Deleted

## 2020-03-18 DIAGNOSIS — C50411 Malignant neoplasm of upper-outer quadrant of right female breast: Secondary | ICD-10-CM

## 2020-03-18 DIAGNOSIS — Z170 Estrogen receptor positive status [ER+]: Secondary | ICD-10-CM

## 2020-03-18 DIAGNOSIS — C189 Malignant neoplasm of colon, unspecified: Secondary | ICD-10-CM

## 2020-03-18 HISTORY — DX: Malignant neoplasm of colon, unspecified: C18.9

## 2020-03-20 ENCOUNTER — Telehealth: Payer: Self-pay

## 2020-03-20 NOTE — Telephone Encounter (Signed)
   [facility] Pre-operative Risk Assessment    Request for surgical clearance:  1. What type of surgery is being performed? Laparoscopic resection of right colon and sigmoid colon.   2. When is this surgery scheduled? 04/03/2020   3. What type of clearance is required (medical clearance vs. Pharmacy clearance to hold med vs. Both)? Both   4. Are there any medications that need to be held prior to surgery and how long?Coumadin 36m( time not specified)   5. Practice name and name of physician performing surgery? Dr. JOrrin Brigham   6. What is your office phone number: [phone]   7.   What is your office fax [phone]  8.   Anesthesia type (None, local, MAC, general) ? General Anesthesia   JBasil DessPrevatt 03/20/2020, 4:55 PM  _________________________________________________________________   (provider comments below)

## 2020-03-21 ENCOUNTER — Other Ambulatory Visit: Payer: Self-pay

## 2020-03-21 ENCOUNTER — Ambulatory Visit (INDEPENDENT_AMBULATORY_CARE_PROVIDER_SITE_OTHER): Payer: Medicare Other | Admitting: Cardiology

## 2020-03-21 ENCOUNTER — Encounter: Payer: Self-pay | Admitting: Cardiology

## 2020-03-21 VITALS — BP 130/60 | HR 66 | Ht 70.0 in | Wt 157.0 lb

## 2020-03-21 DIAGNOSIS — Z01810 Encounter for preprocedural cardiovascular examination: Secondary | ICD-10-CM

## 2020-03-21 DIAGNOSIS — I480 Paroxysmal atrial fibrillation: Secondary | ICD-10-CM

## 2020-03-21 DIAGNOSIS — Z01818 Encounter for other preprocedural examination: Secondary | ICD-10-CM | POA: Diagnosis not present

## 2020-03-21 DIAGNOSIS — I10 Essential (primary) hypertension: Secondary | ICD-10-CM

## 2020-03-21 DIAGNOSIS — E785 Hyperlipidemia, unspecified: Secondary | ICD-10-CM

## 2020-03-21 DIAGNOSIS — F17200 Nicotine dependence, unspecified, uncomplicated: Secondary | ICD-10-CM

## 2020-03-21 NOTE — Patient Instructions (Signed)
Medication Instructions:  Your physician recommends that you continue on your current medications as directed. Please refer to the Current Medication list given to you today.  *If you need a refill on your cardiac medications before your next appointment, please call your pharmacy*   Lab Work: None. If you have labs (blood work) drawn today and your tests are completely normal, you will receive your results only by: Marland Kitchen MyChart Message (if you have MyChart) OR . A paper copy in the mail If you have any lab test that is abnormal or we need to change your treatment, we will call you to review the results.   Testing/Procedures:   [facility] [address] [place], [place] [address] Phone:  [phone]    Please arrive 15 minutes prior to your appointment time for registration and insurance purposes.  The test will take approximately 3 to 4 hours to complete; you may bring reading material.  If someone comes with you to your appointment, they will need to remain in the main lobby due to limited space in the testing area. **If you are pregnant or breastfeeding, please notify the nuclear lab prior to your appointment**  How to prepare for your Myocardial Perfusion Test: . Do not eat or drink 3 hours prior to your test, except you may have water. . Do not consume products containing caffeine (regular or decaffeinated) 12 hours prior to your test. (ex: coffee, chocolate, sodas, tea). . Do bring a list of your current medications with you.  If not listed below, you may take your medications as normal.  . Do wear comfortable clothes (no dresses or overalls) and walking shoes, tennis shoes preferred (No heels or open toe shoes are allowed). . Do NOT wear cologne, perfume, aftershave, or lotions (deodorant is allowed). . If these instructions are not followed, your test will have to be rescheduled.  Please report to [address] for your test.  If you have  questions or concerns about your appointment, you can call the [facility] at [phone].  If you cannot keep your appointment, please provide 24 hours notification to the Nuclear Lab, to avoid a possible $50 charge to your account.    Follow-Up: At [facility], you and your health needs are our priority.  As part of our continuing mission to provide you with exceptional heart care, we have created designated Provider Care Teams.  These Care Teams include your primary Cardiologist (physician) and Advanced Practice Providers (APPs -  Physician Assistants and Nurse Practitioners) who all work together to provide you with the care you need, when you need it.  We recommend signing up for the patient portal called "MyChart".  Sign up information is provided on this After Visit Summary.  MyChart is used to connect with patients for Virtual Visits (Telemedicine).  Patients are able to view lab/test results, encounter notes, upcoming appointments, etc.  Non-urgent messages can be sent to your provider as well.   To learn more about what you can do with MyChart, go to [email or web address].    Your next appointment:   3 month(s)  The format for your next appointment:   In Person  Provider:   Jenne Campus, MD   Other Instructions   Cardiac Nuclear Scan A cardiac nuclear scan is a test that measures blood flow to the heart when a person is resting and when he or she is exercising. The test looks for problems such as:  Not enough blood reaching a portion of the heart.  The heart muscle not working normally. You may need this test if:  You have heart disease.  You have had abnormal lab results.  You have had heart surgery or a balloon procedure to open up blocked arteries (angioplasty).  You have chest pain.  You have shortness of breath. In this test, a radioactive dye (tracer) is injected into your bloodstream. After the tracer has traveled to  your heart, an imaging device is used to measure how much of the tracer is absorbed by or distributed to various areas of your heart. This procedure is usually done at a hospital and takes 2-4 hours. Tell a health care provider about:  Any allergies you have.  All medicines you are taking, including vitamins, herbs, eye drops, creams, and over-the-counter medicines.  Any problems you or family members have had with anesthetic medicines.  Any blood disorders you have.  Any surgeries you have had.  Any medical conditions you have.  Whether you are pregnant or may be pregnant. What are the risks? Generally, this is a safe procedure. However, problems may occur, including:  Serious chest pain and heart attack. This is only a risk if the stress portion of the test is done.  Rapid heartbeat.  Sensation of warmth in your chest. This usually passes quickly.  Allergic reaction to the tracer. What happens before the procedure?  Ask your health care provider about changing or stopping your regular medicines. This is especially important if you are taking diabetes medicines or blood thinners.  Follow instructions from your health care provider about eating or drinking restrictions.  Remove your jewelry on the day of the procedure. What happens during the procedure?  An IV will be inserted into one of your veins.  Your health care provider will inject a small amount of radioactive tracer through the IV.  You will wait for 20-40 minutes while the tracer travels through your bloodstream.  Your heart activity will be monitored with an electrocardiogram (ECG).  You will lie down on an exam table.  Images of your heart will be taken for about 15-20 minutes.  You may also have a stress test. For this test, one of the following may be done: ? You will exercise on a treadmill or stationary bike. While you exercise, your heart's activity will be monitored with an ECG, and your blood  pressure will be checked. ? You will be given medicines that will increase blood flow to parts of your heart. This is done if you are unable to exercise.  When blood flow to your heart has peaked, a tracer will again be injected through the IV.  After 20-40 minutes, you will get back on the exam table and have more images taken of your heart.  Depending on the type of tracer used, scans may need to be repeated 3-4 hours later.  Your IV line will be removed when the procedure is over. The procedure may vary among health care providers and hospitals. What happens after the procedure?  Unless your health care provider tells you otherwise, you may return to your normal schedule, including diet, activities, and medicines.  Unless your health care provider tells you otherwise, you may increase your fluid intake. This will help to flush the contrast dye from your body. Drink enough fluid to keep your urine pale yellow.  Ask your health care provider, or the department that is doing the test: ? When will my results   be ready? ? How will I get my results? Summary  A cardiac nuclear scan measures the blood flow to the heart when a person is resting and when he or she is exercising.  Tell your health care provider if you are pregnant.  Before the procedure, ask your health care provider about changing or stopping your regular medicines. This is especially important if you are taking diabetes medicines or blood thinners.  After the procedure, unless your health care provider tells you otherwise, increase your fluid intake. This will help flush the contrast dye from your body.  After the procedure, unless your health care provider tells you otherwise, you may return to your normal schedule, including diet, activities, and medicines. This information is not intended to replace advice given to you by your health care provider. Make sure you discuss any questions you have with your health care  provider. Document Revised: 09/12/2017 Document Reviewed: 09/12/2017 Elsevier Patient Education  2020 Elsevier Inc.   

## 2020-03-21 NOTE — Telephone Encounter (Signed)
Hi Dr. Agustin Cree,   Patient has laparoscopic resection of right colon and sigmoid colon scheduled for 04/03/2020. You are scheduled to see her this morning at 9:00am. Can you please address pre-op risk assessment during your visit.   Thank you! Sabrina [name]

## 2020-03-21 NOTE — Progress Notes (Signed)
Cardiology Office Note:    Date:  03/21/2020   ID:  [name], DOB 07/30/1950, MRN [id number]  PCP:  Enid Skeens., MD  Cardiologist:  Jenne Campus, MD    Referring MD: Enid Skeens., MD   Chief Complaint  Patient presents with  . Follow-up  I need surgery  History of Present Illness:    Sabrina [name] is a 69 y.o. female with complex past medical history which include paroxysmal atrial fibrillation, she is anticoagulated with Coumadin as per her wishes, chronic smoking, type 2 diabetes, essential hypertension, history of breast cancer.  Recently she was discovered to have colon mass and surgery is contemplated.  She was sent to my office for evaluation.  Overall she is doing well but her ability to exercise is limited because of shortness of breath.  Sadly she still continues to smoke.  On top of that she becomes severely anemic which make her very weak tired and exhausted it is really very difficult to evaluate her ability to exercise based on her symptomatology right now.  Denies have any chest pain tightness squeezing pressure burning chest no palpitations no dizziness no swelling of lower extremities  Past Medical History:  Diagnosis Date  . Atrial fibrillation ([place]) [I48.91] 11/08/2016  . Benign essential hypertension 08/25/2015  . Colon cancer ([place]) 03/18/2020  . Diabetes ([place])   . Diabetes mellitus ([place]) 06/12/2018  . Dyslipidemia 02/13/2015  . Dysrhythmia    AFIB  . Encounter for therapeutic drug monitoring 11/08/2016  . Family history of breast cancer   . Family history of leukemia   . Family history of prostate cancer   . Genetic testing 07/05/2018   Negative genetic testing on the common hereditary cancer panel.  The Hereditary Gene Panel offered by [facility] includes sequencing and/or deletion duplication testing of the following 48 genes: APC, ATM, AXIN2, BARD1, BMPR1A, BRCA1, BRCA2, BRIP1, CDH1, CDK4, CDKN2A (p14ARF), CDKN2A (p16INK4a), CHEK2, CTNNA1,  DICER1, EPCAM (Deletion/duplication testing only), GREM1 (promoter region deletion/duplicat  . Hyperlipidemia   . Hypothyroidism due to Hashimoto's thyroiditis 05/23/2015  . Iron deficiency anemia due to chronic blood loss 12/26/2019  . Long term (current) use of anticoagulants [Z79.01] 11/08/2016  . Malignant neoplasm of upper-outer quadrant of right breast in female, estrogen receptor positive ([place]) 06/26/2018  . Moderate episode of recurrent major depressive disorder ([place]) 12/01/2015  . Paroxysmal atrial fibrillation ([place]) 02/13/2015   Formatting of this note might be different from the original. Chads score 1, chads vas 2, anticoagulated with warfarin  . Personal history of chemotherapy   . Personal history of radiation therapy   . Port-A-Cath in place 08/24/2018  . Smoking 07/12/2017  . Statin intolerance 12/08/2016  . Type 2 diabetes mellitus without complication, without long-term current use of insulin ([place]) 05/23/2015    Past Surgical History:  Procedure Laterality Date  . ABDOMINAL HYSTERECTOMY    . APPENDECTOMY    . BREAST LUMPECTOMY Right 07/13/2018  . BREAST LUMPECTOMY WITH RADIOACTIVE SEED AND SENTINEL LYMPH NODE BIOPSY Right 07/13/2018   Procedure: RIGHT BREAST LUMPECTOMY WITH RADIOACTIVE SEED AND RIGHT SENTINEL LYMPH NODE MAPPING;  Surgeon: Erroll Luna, MD;  Location: [facility];  Service: General;  Laterality: Right;  . BREAST SURGERY    . CHOLECYSTECTOMY    . KNEE SURGERY    . PORT-A-CATH REMOVAL N/A 08/21/2019   Procedure: REMOVAL PORT-A-CATH;  Surgeon: Erroll Luna, MD;  Location: [facility];  Service: General;  Laterality: N/A;  . PORTACATH PLACEMENT Right  08/02/2018   Procedure: INSERTION PORT-A-CATH WITH ULTRASOUND;  Surgeon: Erroll Luna, MD;  Location: [facility];  Service: General;  Laterality: Right;    Current Medications: Current Meds  Medication Sig  . anastrozole (ARIMIDEX) 1 MG tablet Take 1 tablet (1 mg  total) by mouth daily.  . digoxin (LANOXIN) 0.125 MG tablet TAKE 1 TABLET DAILY  . escitalopram (LEXAPRO) 10 MG tablet Take 10 mg by mouth daily.  . flecainide (TAMBOCOR) 100 MG tablet TAKE 1 TABLET TWICE A DAY  . ibuprofen (ADVIL) 800 MG tablet Take 1 tablet (800 mg total) by mouth every 8 (eight) hours as needed.  Marland Kitchen levothyroxine (SYNTHROID, LEVOTHROID) 100 MCG tablet Take 1 tablet by mouth daily.  Marland Kitchen lisinopril (PRINIVIL,ZESTRIL) 10 MG tablet Take 1 tablet (10 mg total) by mouth daily.  . metFORMIN (GLUCOPHAGE) 500 MG tablet Take 1 tablet by mouth 2 (two) times daily with a meal.   . metoprolol succinate (TOPROL-XL) 50 MG 24 hr tablet TAKE 1 TABLET DAILY (Patient taking differently: in the morning and at bedtime.)  . warfarin (COUMADIN) 10 MG tablet TAKE ONE-HALF (1/2) TO ONE TABLET AS DIRECTED BY COUMADIN CLINIC     Allergies:   Atorvastatin and Meperidine   Social History   Socioeconomic History  . Marital status: Widowed    Spouse name: Not on file  . Number of children: Not on file  . Years of education: Not on file  . Highest education [name]: Not on file  Occupational History  . Not on file  Tobacco Use  . Smoking status: Current Every Day Smoker    Packs/day: 2.00    Years: 40.00    Pack years: 80.00  . Smokeless tobacco: Never Used  Vaping Use  . Vaping Use: Never used  Substance and Sexual Activity  . Alcohol use: Never  . Drug use: Never  . Sexual activity: Not on file  Other Topics Concern  . Not on file  Social History Narrative  . Not on file   Social Determinants of Health   Financial Resource Strain: Not on file  Food Insecurity: Not on file  Transportation Needs: Not on file  Physical Activity: Not on file  Stress: Not on file  Social Connections: Not on file     Family History: The patient's family history includes Acute myelogenous leukemia in her brother; Alcohol abuse in her maternal aunt, maternal uncle, and paternal uncle; Breast cancer in  her cousin and maternal aunt; Breast cancer (age of onset: 57) in her cousin; Breast cancer (age of onset: 58) in her mother; Cancer in her brother and maternal uncle; Diabetes in her maternal uncle, paternal uncle, and son; Hypertension in her maternal aunt and maternal grandmother; Prostate cancer in her father. ROS:   Please see the history of present illness.    All 14 point review of systems negative except as described per history of present illness  EKGs/Labs/Other Studies Reviewed:      Recent Labs: 08/17/2019: ALT 14; BUN 10; Creatinine, Ser 0.54; Potassium 4.3; Sodium 137 12/24/2019: Hemoglobin 8.4; Platelet Count 267  Recent Lipid Panel No results found for: CHOL, TRIG, HDL, CHOLHDL, VLDL, LDLCALC, LDLDIRECT  Physical Exam:    VS:  BP 130/60 (BP Location: Right Arm, Patient Position: Sitting)   Pulse 66   Ht 5' 10"  (1.778 m)   Wt 157 lb (71.2 kg)   SpO2 97%   BMI 22.53 kg/m     Wt Readings from Last 3 Encounters:  03/21/20 157 lb (71.2 kg)  01/29/20 167 lb 6.4 oz (75.9 kg)  12/26/19 167 lb 11.2 oz (76.1 kg)     GEN:  Well nourished, well developed in no acute distress HEENT: Normal NECK: No JVD; No carotid bruits LYMPHATICS: No lymphadenopathy CARDIAC: RRR, no murmurs, no rubs, no gallops RESPIRATORY:  Clear to auscultation without rales, wheezing or rhonchi  ABDOMEN: Soft, non-tender, non-distended MUSCULOSKELETAL:  No edema; No deformity  SKIN: Warm and dry LOWER EXTREMITIES: no swelling NEUROLOGIC:  Alert and oriented x 3 PSYCHIATRIC:  Normal affect   ASSESSMENT:    1. Pre-op evaluation   2. Paroxysmal atrial fibrillation (HCC)   3. Preop cardiovascular exam   4. Benign essential hypertension   5. Dyslipidemia   6. Smoking    PLAN:    In order of problems listed above:  1. Cardiovascular preop evaluation for colon surgery in this lady with multiple risk factors for coronary artery disease.  I think the most prudent approach will be to do stress test  to make sure she does not have any inducible ischemia we will schedule her to have it done within the next week.  Based on that we will assess her risks.  If stress test is negative she should be acceptable candidate for surgery. 2. Paroxysmal atrial fibrillation her EKG today showed normal sinus rhythm, she is on flecainide which I will continue.  Coumadin need to be withdrawn before surgery, will bring her to the office in 7 days before surgery to check her INR and based on that we decide how to manage her Coumadin. 3. Essential hypertension blood pressure well controlled continue present management. 4. Dyslipidemia: On statin which I will continue. 5. Smoking obviously a problem she still continues to smoke she help when she remain the hospital after surgery for 5 that she would be able to stop smoking.   Medication Adjustments/Labs and Tests Ordered: Current medicines are reviewed at length with the patient today.  Concerns regarding medicines are outlined above.  Orders Placed This Encounter  Procedures  . MYOCARDIAL PERFUSION IMAGING  . EKG 12-Lead   Medication changes: No orders of the defined types were placed in this encounter.   Signed, Park Liter, MD, [facility] 03/21/2020 9:48 AM    [place]

## 2020-03-21 NOTE — Telephone Encounter (Signed)
Sabrina [name] please make sure she will have follow-up appointment with me within the next week or 2

## 2020-03-24 ENCOUNTER — Other Ambulatory Visit: Payer: Self-pay

## 2020-03-24 ENCOUNTER — Inpatient Hospital Stay: Payer: Medicare Other | Attending: Hematology and Oncology

## 2020-03-24 DIAGNOSIS — Z85038 Personal history of other malignant neoplasm of large intestine: Secondary | ICD-10-CM | POA: Insufficient documentation

## 2020-03-24 DIAGNOSIS — C50411 Malignant neoplasm of upper-outer quadrant of right female breast: Secondary | ICD-10-CM | POA: Insufficient documentation

## 2020-03-24 DIAGNOSIS — Z79899 Other long term (current) drug therapy: Secondary | ICD-10-CM | POA: Diagnosis not present

## 2020-03-24 DIAGNOSIS — Z79811 Long term (current) use of aromatase inhibitors: Secondary | ICD-10-CM | POA: Diagnosis not present

## 2020-03-24 DIAGNOSIS — Z170 Estrogen receptor positive status [ER+]: Secondary | ICD-10-CM | POA: Insufficient documentation

## 2020-03-24 DIAGNOSIS — D500 Iron deficiency anemia secondary to blood loss (chronic): Secondary | ICD-10-CM | POA: Insufficient documentation

## 2020-03-24 DIAGNOSIS — Z923 Personal history of irradiation: Secondary | ICD-10-CM | POA: Insufficient documentation

## 2020-03-24 DIAGNOSIS — Z9221 Personal history of antineoplastic chemotherapy: Secondary | ICD-10-CM | POA: Insufficient documentation

## 2020-03-24 LAB — IRON AND TIBC
Iron: 15 ug/dL — ABNORMAL LOW (ref 41–142)
Saturation Ratios: 4 % — ABNORMAL LOW (ref 21–57)
TIBC: 380 ug/dL (ref 236–444)
UIBC: 366 ug/dL (ref 120–384)

## 2020-03-24 LAB — CBC WITH DIFFERENTIAL (CANCER CENTER ONLY)
Abs Immature Granulocytes: 0.01 10*3/uL (ref 0.00–0.07)
Basophils Absolute: 0.1 10*3/uL (ref 0.0–0.1)
Basophils Relative: 1 %
Eosinophils Absolute: 0.1 10*3/uL (ref 0.0–0.5)
Eosinophils Relative: 2 %
HCT: 29.1 % — ABNORMAL LOW (ref 36.0–46.0)
Hemoglobin: 8.5 g/dL — ABNORMAL LOW (ref 12.0–15.0)
Immature Granulocytes: 0 %
Lymphocytes Relative: 11 %
Lymphs Abs: 0.8 10*3/uL (ref 0.7–4.0)
MCH: 24.2 pg — ABNORMAL LOW (ref 26.0–34.0)
MCHC: 29.2 g/dL — ABNORMAL LOW (ref 30.0–36.0)
MCV: 82.9 fL (ref 80.0–100.0)
Monocytes Absolute: 0.5 10*3/uL (ref 0.1–1.0)
Monocytes Relative: 8 %
Neutro Abs: 5.5 10*3/uL (ref 1.7–7.7)
Neutrophils Relative %: 78 %
Platelet Count: 252 10*3/uL (ref 150–400)
RBC: 3.51 MIL/uL — ABNORMAL LOW (ref 3.87–5.11)
RDW: 19.2 % — ABNORMAL HIGH (ref 11.5–15.5)
WBC Count: 6.9 10*3/uL (ref 4.0–10.5)
nRBC: 0 % (ref 0.0–0.2)

## 2020-03-24 LAB — FERRITIN: Ferritin: 24 ng/mL (ref 11–307)

## 2020-03-24 LAB — CEA (IN HOUSE-CHCC): CEA (CHCC-In House): 2.72 ng/mL (ref 0.00–5.00)

## 2020-03-25 ENCOUNTER — Other Ambulatory Visit: Payer: Self-pay

## 2020-03-25 ENCOUNTER — Telehealth: Payer: Self-pay

## 2020-03-25 ENCOUNTER — Ambulatory Visit (INDEPENDENT_AMBULATORY_CARE_PROVIDER_SITE_OTHER): Payer: Medicare Other

## 2020-03-25 DIAGNOSIS — I480 Paroxysmal atrial fibrillation: Secondary | ICD-10-CM | POA: Diagnosis not present

## 2020-03-25 DIAGNOSIS — Z170 Estrogen receptor positive status [ER+]: Secondary | ICD-10-CM

## 2020-03-25 DIAGNOSIS — Z5181 Encounter for therapeutic drug level monitoring: Secondary | ICD-10-CM | POA: Diagnosis not present

## 2020-03-25 DIAGNOSIS — Z7901 Long term (current) use of anticoagulants: Secondary | ICD-10-CM

## 2020-03-25 DIAGNOSIS — R911 Solitary pulmonary nodule: Secondary | ICD-10-CM

## 2020-03-25 LAB — POCT INR: INR: 1.6 — AB (ref 2.0–3.0)

## 2020-03-25 NOTE — Patient Instructions (Signed)
Take 2 tablets today only and then Continue 10mg  daily except 5mg  on Mondays, Wednesdays and Saturdays.  Recheck INR in 2 weeks.

## 2020-03-25 NOTE — Telephone Encounter (Signed)
Spoke with the patient, detailed instructions given. She stated that  She understood and would be here for her test. Asked to call back with any questions. S.Ubah Radke EMTP

## 2020-03-25 NOTE — Telephone Encounter (Signed)
Per MD verbal orders, patient set up for PET scan.   Pt requested PET scan to be completed at [facility].   RN notified PA department, no PA needed.  Patient scheduled for PET scan @ [facility] for 03/31/2020 @ 515am.    RN to fax order to [phone]

## 2020-03-25 NOTE — Progress Notes (Signed)
Patient Care Team: Enid Skeens., MD as PCP - General (Family Medicine) Park Liter, MD as PCP - Cardiology (Cardiology) Erroll Luna, MD as Consulting Physician (General Surgery) Nicholas Lose, MD as Consulting Physician (Hematology and Oncology) Eppie Gibson, MD as Attending Physician (Radiation Oncology) Park Liter, MD as Consulting Physician (Cardiology)  DIAGNOSIS:    ICD-10-CM   1. Malignant neoplasm of upper-outer quadrant of right breast in female, estrogen receptor positive ([place])  C50.411    Z17.0   2. 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[place])  C18.2     SUMMARY OF ONCOLOGIC HISTORY: Oncology History  Malignant neoplasm of upper-outer quadrant of right breast in female, estrogen receptor positive ([place])  06/19/2018 Initial Diagnosis   Screening mammogram detected right breast mass at 9 o'clock position 3 cm from the nipple, ultrasound revealed 7 x 5 x 5 mm mass, axillary ultrasound normal-appearing lymph nodes, ultrasound biopsy: Grade 1 IDC ER 100%, PR 10%, Ki-67 10%, HER-2 3+ positive, T1b N0 stage Ia   06/28/2018 Cancer Staging   Staging form: Breast, AJCC 8th Edition - Clinical stage from 06/28/2018: Stage IA (cT1b, cN0, cM0, G1, ER+, PR+, HER2+) - Signed by Nicholas Lose, MD on 06/28/2018   07/05/2018 Genetic Testing   Negative genetic testing on the common hereditary cancer panel.  The Hereditary Gene Panel offered by [facility] includes sequencing and/or deletion duplication testing of the following 48 genes: APC, ATM, AXIN2, BARD1, BMPR1A, BRCA1, BRCA2, BRIP1, CDH1, CDK4, CDKN2A (p14ARF), CDKN2A (p16INK4a), CHEK2, CTNNA1, DICER1, EPCAM (Deletion/duplication testing only), GREM1 (promoter region deletion/duplication testing only), KIT, MEN1, MLH1, MSH2, MSH3, MSH6, MUTYH, NBN, NF1, NHTL1, PALB2, PDGFRA, PMS2, POLD1, POLE, PTEN, RAD50, RAD51C, RAD51D, RNF43, SDHB, SDHC, SDHD, SMAD4, SMARCA4. STK11, TP53, TSC1, TSC2, and VHL.  The following genes were  evaluated for sequence changes only: SDHA and HOXB13 c.251G>A variant only. The report date is July 05, 2018.   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    08/17/2018 - 07/11/2019 Chemotherapy   Taxol Herceptin followed by Herceptin maintenance   12/05/2018 - 01/02/2019 Radiation Therapy   Adjuvant XRT   01/25/2019 -  Anti-estrogen oral therapy   Anastrozole 75m daily, plan 5-7 years   07/13/2019 Cancer Staging   Staging form: Breast, AJCC 8th Edition - Pathologic stage from 07/13/2019: Stage IA (pT1b, pN0, cM0, G2, ER+, PR+, HER2+) - Signed by CGardenia Phlegm NP on 08/01/2019   [place]  03/13/2020 Initial Diagnosis   CT Colonoscopy: Large mass centered about the medial wall cecum and terminal ileum, favoring colonic versus less 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 Adjacent ileocolic mesenteric adenopathy, consistent with nodal metastasis. 3 mm right lower lobe pulmonary nodule     CHIEF COMPLIANT: Follow-upof iron deficiency anemia  INTERVAL HISTORY: Sabrina [name] a 69y.o. with above-mentioned history of right breast cancer who underwent a lumpectomy, adjuvant chemotherapy,radiation,Herceptin maintenance,andis currently onanti-estrogen therapywith anastrozole.She also has a history of severe iron deficiency anemia, treated with IV iron (last 02/18/20). She presents to the clinic todayforfollow-up. She is in tears today because of the recent diagnosis of a colon tumor.  She has seen gastroenterology who could not finish the colonoscopy because of his sigmoid colon stricture.  CT colonoscopy revealed a tumor at the ileocecal junction. Her bowels alternate from constipation and diarrhea. She has had poor appetite and has lost 25 pounds since September. She received IV iron  the[facility] at [facility] in November 2021.  ALLERGIES:  is allergic to atorvastatin and  meperidine.  MEDICATIONS:  Current Outpatient Medications  Medication Sig Dispense Refill   anastrozole (ARIMIDEX) 1 MG tablet Take 1 tablet (1 mg total) by mouth daily. 90 tablet 3   digoxin (LANOXIN) 0.125 MG tablet TAKE 1 TABLET DAILY 30 tablet 0   escitalopram (LEXAPRO) 10 MG tablet Take 10 mg by mouth daily.     flecainide (TAMBOCOR) 100 MG tablet TAKE 1 TABLET TWICE A DAY 60 tablet 0   ibuprofen (ADVIL) 800 MG tablet Take 1 tablet (800 mg total) by mouth every 8 (eight) hours as needed. 30 tablet 0   levothyroxine (SYNTHROID, LEVOTHROID) 100 MCG tablet Take 1 tablet by mouth daily.     lisinopril (PRINIVIL,ZESTRIL) 10 MG tablet Take 1 tablet (10 mg total) by mouth daily. 90 tablet 3   metFORMIN (GLUCOPHAGE) 500 MG tablet Take 1 tablet by mouth 2 (two) times daily with a meal.      metoprolol succinate (TOPROL-XL) 50 MG 24 hr tablet TAKE 1 TABLET DAILY (Patient taking differently: in the morning and at bedtime.) 90 tablet 3   warfarin (COUMADIN) 10 MG tablet TAKE ONE-HALF (1/2) TO ONE TABLET AS DIRECTED BY COUMADIN CLINIC 90 tablet 1   No current facility-administered medications for this visit.    PHYSICAL EXAMINATION: ECOG PERFORMANCE STATUS: 2 - Symptomatic, <50% confined to bed  Vitals:   03/26/20 0849  BP: 127/64  Pulse: 80  Resp: 18  Temp: (!) 97.2 F (36.2 C)  SpO2: 99%   Filed Weights   03/26/20 0849  Weight: 153 lb 3.2 oz (69.5 kg)      LABORATORY DATA:  I have reviewed the data as listed CMP Latest Ref Rng & Units 08/17/2019 07/11/2019 05/30/2019  Glucose 70 - 99 mg/dL 153(H) 134(H) 128(H)  BUN 8 - 23 mg/dL 10 12 11   Creatinine 0.44 - 1.00 mg/dL 0.54 0.66 0.66  Sodium 135 - 145 mmol/L 137 141 140  Potassium 3.5 - 5.1 mmol/L 4.3 4.1 4.3  Chloride 98 - 111 mmol/L 99 103 101  CO2 22 - 32 mmol/L 31 29 29   Calcium 8.9 - 10.3 mg/dL 9.2 9.2 9.2  Total Protein 6.5 - 8.1 g/dL 6.3(L) 6.6 7.1  Total Bilirubin 0.3 - 1.2 mg/dL 0.4 <0.2(L) <0.2(L)  Alkaline  Phos 38 - 126 U/L 65 68 82  AST 15 - 41 U/L 16 14(L) 15  ALT 0 - 44 U/L 14 11 10     Lab Results  Component Value Date   WBC 6.9 03/24/2020   HGB 8.5 (L) 03/24/2020   HCT 29.1 (L) 03/24/2020   MCV 82.9 03/24/2020   PLT 252 03/24/2020   NEUTROABS 5.5 03/24/2020    ASSESSMENT & PLAN:  Malignant neoplasm of upper-outer quadrant of right breast in female, estrogen receptor positive ([place]) 07/13/2018:Right lumpectomy ([facility]): IDC with DCIS, 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  Treatment plan: 1.Adj chemo with Taxol-Herceptin weekly X 12completed 7/23/2020and then q 3 weeks Herceptin.Completed 07/11/2019 2.Adjuvant radiation therapystarted 12/05/2018-01/02/19 3.Followed by adjuvant antiestrogen therapywith Anastrozole in 01/2019 ----------------------------------------------------------------------------------------------------------------------------------------------- Current treatment: Adjuvant anastrozole Anastrozole toxicities: Tolerating it fairly well Breast cancer surveillance: Mammogram 06/15/2019: Benign breast density category C Bone density 08/14/2019: T score -2: Osteopenia  Iron deficiency anemia due to chronic blood loss Prior iron infusion: February 2021, September 2021 Lab review: 03/24/20: Ferritin: 24, Hb 8.5, MCV: 82.9, Iron sat: 4% Received IV  iron therapy November 2021 at [facility].   Colon cancer (Sabrina [name]) 03/13/20: CT Colonoscopy: Large mass centered about the medial wall cecum and terminal ileum, favoring colonic versus less 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 Adjacent ileocolic mesenteric adenopathy, consistent with nodal metastasis. 3 mm right lower lobe pulmonary nodule. Direct colonoscopy was not possible because of the sigmoid stricture.  Concern for lung nodules and liver nodule: PET CT ordered. Pre-Op CEA: 2.72 (03/26/20).  Plan: Surgical excision of the ileocecal mass and the stricture of sigmoid colon.  (assuming PET will be negative) She may need adjuvant chemo since it appears that LN are involved. (she will be sent to see GI Onc after surgery) After surgery we will arrange for her to follow-up with Dr. Benay Spice or Dr. Burr Medico or Dr. Chryl Heck to discuss adjuvant treatment options.  Assuming the PET scan is negative for metastatic disease.  Severe anxiety and emotional issues: I offered her counseling and support.  She did not want to use any sedatives at this time. Return to clinic after surgery to discuss support.  No orders of the defined types were placed in this encounter.  The patient has a good understanding of the overall plan. she agrees with it. she will call with any problems that may develop before the next visit here.  Total time spent: 45 mins including face to face time and time spent for planning, charting and coordination of care  Nicholas Lose, MD 03/26/2020  I, Cloyde Reams Dorshimer, am acting as scribe for Dr. Nicholas Lose.  I have reviewed the above documentation for accuracy and completeness, and I agree with the above.

## 2020-03-26 ENCOUNTER — Inpatient Hospital Stay (HOSPITAL_BASED_OUTPATIENT_CLINIC_OR_DEPARTMENT_OTHER): Payer: Medicare Other | Admitting: Hematology and Oncology

## 2020-03-26 ENCOUNTER — Telehealth: Payer: Self-pay | Admitting: Cardiology

## 2020-03-26 ENCOUNTER — Other Ambulatory Visit: Payer: Self-pay

## 2020-03-26 ENCOUNTER — Inpatient Hospital Stay: Payer: Medicare Other

## 2020-03-26 DIAGNOSIS — C50411 Malignant neoplasm of upper-outer quadrant of right female breast: Secondary | ICD-10-CM

## 2020-03-26 DIAGNOSIS — C787 Secondary malignant neoplasm of liver and intrahepatic bile duct: Secondary | ICD-10-CM | POA: Insufficient documentation

## 2020-03-26 DIAGNOSIS — C182 Malignant neoplasm of ascending colon: Secondary | ICD-10-CM | POA: Diagnosis not present

## 2020-03-26 DIAGNOSIS — Z170 Estrogen receptor positive status [ER+]: Secondary | ICD-10-CM

## 2020-03-26 DIAGNOSIS — C189 Malignant neoplasm of colon, unspecified: Secondary | ICD-10-CM | POA: Insufficient documentation

## 2020-03-26 NOTE — Telephone Encounter (Signed)
Faxed to [phone].  Fax sent successful.

## 2020-03-26 NOTE — Assessment & Plan Note (Addendum)
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  Treatment plan: 1.Adj chemo with Taxol-Herceptin weekly X 12completed 7/23/2020and then q 3 weeks Herceptin.Completed 07/11/2019 2.Adjuvant radiation therapystarted 12/05/2018-01/02/19 3.Followed by adjuvant antiestrogen therapywith Anastrozole in 01/2019 ----------------------------------------------------------------------------------------------------------------------------------------------- Current treatment: Adjuvant anastrozole Anastrozole toxicities: Tolerating it fairly well Breast cancer surveillance: Mammogram 06/15/2019: Benign breast density category C Bone density 08/14/2019: T score -2: Osteopenia  Iron deficiency anemia due to chronic blood loss Prior iron infusion: February 2021, September 2021 Lab review: 03/24/20: Ferritin: 24, Hb 8.5, MCV: 82.9, Iron sat: 4% I recommended IV Iron once again.

## 2020-03-26 NOTE — Telephone Encounter (Signed)
[facility] was calling to see if the patient was cleared for her procedure. Please advise

## 2020-03-26 NOTE — Telephone Encounter (Signed)
   Primary Cardiologist: Jenne Campus, MD  Pt was recently seen and a nuclear stress test is pending tomorrow.  Further recommendations will be per Dr. Agustin Cree based upon the results of her stress test.  I will forward this note to him.  Call back staff, please let surgeon's office know that we are awaiting the results of her stress test first.  Richardson Dopp, PA-C 03/26/2020, 11:11 AM

## 2020-03-26 NOTE — Assessment & Plan Note (Addendum)
03/13/20: CT Colonoscopy: Large mass centered about the medial wall cecum and terminal ileum, favoring colonic versus less 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 Adjacent ileocolic mesenteric adenopathy, consistent with nodal metastasis. 3 mm right lower lobe pulmonary nodule. Direct colonoscopy was not possible because of the sigmoid stricture.  Concern for lung nodules and liver nodule: PET CT ordered. Pre-Op CEA: 2.72 (03/26/20).  Plan: Surgical excision of the ileocecal mass and the stricture of sigmoid colon. (assuming PET will be negative) She will need adjuvant chemo since it appears that LN are involved. (she will be sent to see GI Onc after surgery)

## 2020-03-26 NOTE — Progress Notes (Signed)
Met with patient at her oncology appointment today with Dr. Lindi Adie.  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  Also scheduled for surgery by Dr. Lilia Pro on 12/23.  I reassured her that we will have her scheduled with one of our GI medical oncologist about 3 weeks s/p her surgery.  I explained the need for her surgical pathology results which will help guide her treatment.  She was given my direct contact information and encouraged to call me if she has any questions.  I offered emotional support.  Patient very tearful.

## 2020-03-26 NOTE — Telephone Encounter (Signed)
I will fax notes to surgeon's office pt has stress test tomorrow 03/27/20.

## 2020-03-26 NOTE — Telephone Encounter (Signed)
Nina from [facility] was calling to request a copy of the visit notes from the patient's visit 03/21/20. Please fax notes to [phone]

## 2020-03-27 ENCOUNTER — Ambulatory Visit (HOSPITAL_COMMUNITY): Payer: Medicare Other | Attending: Cardiovascular Disease

## 2020-03-27 DIAGNOSIS — Z01818 Encounter for other preprocedural examination: Secondary | ICD-10-CM | POA: Insufficient documentation

## 2020-03-27 DIAGNOSIS — Z01810 Encounter for preprocedural cardiovascular examination: Secondary | ICD-10-CM | POA: Diagnosis not present

## 2020-03-27 DIAGNOSIS — I480 Paroxysmal atrial fibrillation: Secondary | ICD-10-CM | POA: Diagnosis not present

## 2020-03-27 LAB — MYOCARDIAL PERFUSION IMAGING
LV dias vol: 75 mL (ref 46–106)
LV sys vol: 25 mL
Peak HR: 109 {beats}/min
Rest HR: 81 {beats}/min
SDS: 3
SRS: 2
SSS: 5
TID: 1.05

## 2020-03-27 MED ORDER — REGADENOSON 0.4 MG/5ML IV SOLN
0.4000 mg | Freq: Once | INTRAVENOUS | Status: AC
  Administered 2020-03-27: 0.4 mg via INTRAVENOUS

## 2020-03-27 MED ORDER — TECHNETIUM TC 99M TETROFOSMIN IV KIT
9.7000 | PACK | Freq: Once | INTRAVENOUS | Status: AC | PRN
  Administered 2020-03-27: 9.7 via INTRAVENOUS
  Filled 2020-03-27: qty 10

## 2020-03-27 MED ORDER — TECHNETIUM TC 99M TETROFOSMIN IV KIT
32.8000 | PACK | Freq: Once | INTRAVENOUS | Status: AC | PRN
  Administered 2020-03-27: 32.8 via INTRAVENOUS
  Filled 2020-03-27: qty 33

## 2020-03-28 ENCOUNTER — Telehealth: Payer: Self-pay

## 2020-03-28 ENCOUNTER — Telehealth: Payer: Self-pay | Admitting: Hematology and Oncology

## 2020-03-28 NOTE — Telephone Encounter (Signed)
No 12/15 los, no changes made to pt schedule

## 2020-03-28 NOTE — Progress Notes (Signed)
Spoke with patient to schedule her to see Dr. Julieanne Manson about 3 weeks after her surgery on 04/03/2020 at [facility].  I have scheduled her for Monday 04/28/2020 at 2 pm to arrive by 1:45 pm.  I have also asked her to sign a release of information so we are able to obtain her records related to her PET scan, surgery and surgical pathology report.  She states she will do this.  I have answered all her questions and she has my direct number should any others arise.

## 2020-03-28 NOTE — Telephone Encounter (Signed)
Patient called back stating she has a conflict on 7/65/4868 with a previous cardiology appointment so I have moved her new patient appointment to 04/29/2020 at 2 pm with Dr. Benay Spice

## 2020-03-28 NOTE — Telephone Encounter (Signed)
Patient with diagnosis of atrial fibrillation on warfarin for anticoagulation.    Procedure: laproscopic colon resection Date of procedure: 04/03/20    CHA2DS2-VASc Score = 4  This indicates a 4.8% annual risk of stroke. The patient's score is based upon: CHF History: No HTN History: Yes Diabetes History: Yes Stroke History: No Vascular Disease History: No Age Score: 1 Gender Score: 1    CrCl 117.8 Platelet count 252  Per office protocol, patient can hold warfarin for 5 days prior to procedure.   Patient will not need bridging with Lovenox (enoxaparin) around procedure.  If not bridging, patient should restart at discretion of procedure MD

## 2020-03-28 NOTE — Telephone Encounter (Signed)
I spoke to the patient and reviewed her Coumadin holding prior to her surgery 04/03/20.  She verbalized understanding and was thankful for the call.

## 2020-03-31 ENCOUNTER — Other Ambulatory Visit: Payer: Self-pay | Admitting: Hematology and Oncology

## 2020-03-31 ENCOUNTER — Telehealth: Payer: Self-pay | Admitting: Cardiology

## 2020-03-31 NOTE — Telephone Encounter (Signed)
Clearance was completed on 03/20/20 and Coumadin clinic contacted patient on 03/28/20

## 2020-03-31 NOTE — Telephone Encounter (Signed)
   Primary Cardiologist: Jenne Campus, MD  Chart reviewed as part of pre-operative protocol coverage. Given past medical history and time since last visit, based on ACC/AHA guidelines, Emmarose Klinke would be at acceptable risk for the planned procedure without further cardiovascular testing.   Patient with diagnosis of atrial fibrillation on warfarin for anticoagulation.    Procedure: laproscopic colon resection Date of procedure: 04/03/20    CHA2DS2-VASc Score = 4  This indicates a 4.8% annual risk of stroke. The patient's score is based upon: CHF History: No HTN History: Yes Diabetes History: Yes Stroke History: No Vascular Disease History: No Age Score: 1 Gender Score: 1    CrCl 117.8 Platelet count 252  Per office protocol, patient can hold warfarin for 5 days prior to procedure.   Patient will not need bridging with Lovenox (enoxaparin) around procedure.  If not bridging, patient should restart at discretion of procedure MD  I will route this recommendation to the requesting party via Epic fax function and remove from pre-op pool.  Please call with questions.  Jossie Ng. Reyan Helle NP-C    03/31/2020, 1:39 PM [facility] [address] Office [phone] Fax [phone]

## 2020-03-31 NOTE — Telephone Encounter (Signed)
   [facility] Pre-operative Risk Assessment    HEARTCARE STAFF: - Please ensure there is not already an duplicate clearance open for this procedure. - Under Visit Info/Reason for Call, type in Other and utilize the format Clearance MM/DD/YY or Clearance TBD. Do not use dashes or single digits. - If request is for dental extraction, please clarify the # of teeth to be extracted.  Request for surgical clearance:  1. What type of surgery is being performed? Laparoscopic resection of R colon and Sigmoid colon   2. When is this surgery scheduled? 04/03/20   3. What type of clearance is required (medical clearance vs. Pharmacy clearance to hold med vs. Both)? Both   4. Are there any medications that need to be held prior to surgery and how long? Coumadin 4 days. Lovenox bridge?   5. Practice name and name of physician performing surgery? Dr. Orrin Brigham, [facility]  6. What is the office phone number? [phone]   7.   What is the office fax number? [phone]   8.   Anesthesia type (None, local, MAC, general) ? General  Ideal surgery would be 12/23 with a projected surgery time of 4 hours. If surgery needs postponed please let surgical team know asap   Sabrina [name] 03/31/2020, 12:55 PM  _________________________________________________________________   (provider comments below)

## 2020-04-03 ENCOUNTER — Ambulatory Visit
Admission: RE | Admit: 2020-04-03 | Discharge: 2020-04-03 | Disposition: A | Discharge status: Home / Self Care | Payer: Self-pay | Source: Ambulatory Visit | Attending: Oncology | Admitting: Oncology

## 2020-04-03 ENCOUNTER — Other Ambulatory Visit: Payer: Self-pay

## 2020-04-03 DIAGNOSIS — C182 Malignant neoplasm of ascending colon: Secondary | ICD-10-CM

## 2020-04-03 NOTE — Progress Notes (Signed)
Faxed request to Sabrina [name] to have PET scan images done on 03/31/2020 pushed to [employer].

## 2020-04-07 ENCOUNTER — Other Ambulatory Visit (HOSPITAL_COMMUNITY): Payer: Medicare Other

## 2020-04-07 ENCOUNTER — Telehealth: Payer: Self-pay | Admitting: Cardiology

## 2020-04-07 ENCOUNTER — Telehealth: Payer: Self-pay

## 2020-04-07 NOTE — Telephone Encounter (Signed)
INR appointment rescheduled for January 18 ( 2 weeks after procedure)

## 2020-04-07 NOTE — Telephone Encounter (Signed)
Patient's surgery has been moved to 04/14/2020, I have rescheduled the patient to 05/06/2020 at 2 pm to see Dr. Truett Perna.  The patient verbalized an understanding.

## 2020-04-07 NOTE — Telephone Encounter (Signed)
Patient called and said her surgery had been postponed. Her surgery was supposed to be 04/03/20 and her new surgery date is 04/14/20. She will have to hold her coumadin again starting 04/09/20. She was not sure if she needed to keep her coumadin appt 04/15/20 or not. Please advise

## 2020-04-14 ENCOUNTER — Telehealth: Payer: Self-pay | Admitting: Oncology

## 2020-04-14 NOTE — Telephone Encounter (Signed)
Rescheduled appointment on 1/25 per provider PAL request. Called patient, no answer. Left message with updated appointment date and time.

## 2020-04-21 NOTE — Progress Notes (Signed)
Faxed request to Dr. Maxie Barb office at [facility] in [place] requesting surgical pathology results be faxed to [place].

## 2020-04-28 ENCOUNTER — Ambulatory Visit: Payer: Medicare Other | Admitting: Oncology

## 2020-04-28 ENCOUNTER — Other Ambulatory Visit: Payer: Self-pay | Admitting: Cardiology

## 2020-04-29 ENCOUNTER — Ambulatory Visit: Payer: Medicare Other | Admitting: Oncology

## 2020-04-29 ENCOUNTER — Other Ambulatory Visit: Payer: Self-pay

## 2020-04-29 ENCOUNTER — Inpatient Hospital Stay: Payer: Medicare Other | Attending: Hematology and Oncology | Admitting: Oncology

## 2020-04-29 VITALS — BP 164/86 | HR 87 | Temp 97.8°F | Resp 15 | Ht 70.0 in | Wt 152.4 lb

## 2020-04-29 DIAGNOSIS — E039 Hypothyroidism, unspecified: Secondary | ICD-10-CM

## 2020-04-29 DIAGNOSIS — Z803 Family history of malignant neoplasm of breast: Secondary | ICD-10-CM

## 2020-04-29 DIAGNOSIS — Z170 Estrogen receptor positive status [ER+]: Secondary | ICD-10-CM | POA: Diagnosis not present

## 2020-04-29 DIAGNOSIS — Z8042 Family history of malignant neoplasm of prostate: Secondary | ICD-10-CM

## 2020-04-29 DIAGNOSIS — D500 Iron deficiency anemia secondary to blood loss (chronic): Secondary | ICD-10-CM

## 2020-04-29 DIAGNOSIS — I480 Paroxysmal atrial fibrillation: Secondary | ICD-10-CM

## 2020-04-29 DIAGNOSIS — I4891 Unspecified atrial fibrillation: Secondary | ICD-10-CM

## 2020-04-29 DIAGNOSIS — C180 Malignant neoplasm of cecum: Secondary | ICD-10-CM

## 2020-04-29 DIAGNOSIS — C50411 Malignant neoplasm of upper-outer quadrant of right female breast: Secondary | ICD-10-CM | POA: Diagnosis not present

## 2020-04-29 DIAGNOSIS — C182 Malignant neoplasm of ascending colon: Secondary | ICD-10-CM

## 2020-04-29 DIAGNOSIS — Z79811 Long term (current) use of aromatase inhibitors: Secondary | ICD-10-CM | POA: Diagnosis not present

## 2020-04-29 DIAGNOSIS — Z79899 Other long term (current) drug therapy: Secondary | ICD-10-CM

## 2020-04-29 DIAGNOSIS — Z9221 Personal history of antineoplastic chemotherapy: Secondary | ICD-10-CM

## 2020-04-29 DIAGNOSIS — Z7901 Long term (current) use of anticoagulants: Secondary | ICD-10-CM

## 2020-04-29 DIAGNOSIS — I10 Essential (primary) hypertension: Secondary | ICD-10-CM

## 2020-04-29 DIAGNOSIS — Z806 Family history of leukemia: Secondary | ICD-10-CM

## 2020-04-29 DIAGNOSIS — E785 Hyperlipidemia, unspecified: Secondary | ICD-10-CM

## 2020-04-29 DIAGNOSIS — Z23 Encounter for immunization: Secondary | ICD-10-CM | POA: Insufficient documentation

## 2020-04-29 DIAGNOSIS — Z5111 Encounter for antineoplastic chemotherapy: Secondary | ICD-10-CM | POA: Insufficient documentation

## 2020-04-29 DIAGNOSIS — F17210 Nicotine dependence, cigarettes, uncomplicated: Secondary | ICD-10-CM

## 2020-04-29 DIAGNOSIS — Z923 Personal history of irradiation: Secondary | ICD-10-CM

## 2020-04-29 DIAGNOSIS — Z9049 Acquired absence of other specified parts of digestive tract: Secondary | ICD-10-CM

## 2020-04-29 DIAGNOSIS — Z90710 Acquired absence of both cervix and uterus: Secondary | ICD-10-CM

## 2020-04-29 DIAGNOSIS — E119 Type 2 diabetes mellitus without complications: Secondary | ICD-10-CM

## 2020-04-29 NOTE — Research (Signed)
[facility] [id number] (ATOMIC) REFERRAL  Received a call from Valda Favia on behalf of Dr. Benay Spice inquiring if a patient with a past history of breast cancer is eligible for the ATOMIC study. Upon review of chart, the diagnosis and treatment of breast cancer has been within the past two years; therefore, this patient is not eligible for the ATOMIC study. Dr. Benay Spice and Malachy Mood were notified via staff message.  Dionne Bucy. Sharlett Iles, BSN, RN, CIC 04/29/2020 3:33 PM

## 2020-04-29 NOTE — Progress Notes (Signed)
[facility] Patient Consult   Requesting MD: Enid Skeens., Md [address],  [place] [id number]   Sabrina [name] 70 y.o.  March 16, 1951    Reason for Consult: Colon cancer   HPI: Sabrina [name] is followed by Dr. Lindi [name] after being diagnosed with early stage breast cancer in 2020.  She reports developing fatigue in August and September of last year.  On 12/24/2019 she was diagnosed with iron deficiency anemia.  She was referred to gastroenterology.  She had an incomplete colonoscopy secondary to a sigmoid colon stricture. She was referred for a virtual colonoscopy on 03/13/2020.  This confirmed a mass at the terminal ileum and cecum with edema in the surrounding pericolonic fat.  A 3 mm nodule was noted in the right lower lung, hepatomegaly, ileocolic mesenteric adenopathy, no free fluid, no evidence of omental or peritoneal disease.  A staging PET scan on 03/31/2020 was compared to CTs from 03/20/2020 (I do not have the CT report available today).  A left lower lobe nodule measuring 0.9 x 0.8 cm had an SUV of 1.1.  A tiny previous right lower lobe nodule and small left lower lobe nodule do not have extenuated metabolic activity.  There is a hypermetabolic ileocecal mass.  Multiple hypermetabolic ileocolic lymph nodes.  At the posterior margin the right hepatic lobe in the area of a previously hypoenhancing lesion there is no current extenuated metabolic activity.  No hypermetabolic liver lesion.  She was referred to Dr. Lilia [name] and was taken to the operating room for a laparoscopic right hemicolectomy on 04/14/2020.  A large mass was noted at the cecum with involvement of the terminal ileum.  The mass was dissected out of the retroperitoneum.  It was felt the difficulty with colonoscopy was due to adhesions involving the sigmoid colon.  A colonoscope was advanced to 80 cm and no stricture was evident.  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 The pathology revealed a  moderately differentiated adenocarcinoma involving the terminal ileum, ileocecal valve, proximal cecum, and appendix.  No macroscopic tumor perforation.  Tumor invades into pericolorectal tissue and within 1 mm of the inked serosa.  Lymphovascular invasion is present.  No perineural invasion.  The resection margins are negative.  9 of 30 lymph nodes contained metastatic carcinoma.  No tumor deposits.  There is loss of MLH1 and PMS2 expression.    Past Medical History:  Diagnosis Date  . Atrial fibrillation ([facility]) [I48.91] 11/08/2016  . Benign essential hypertension 08/25/2015  . Colon cancer ([facility]) 03/18/2020  . Diabetes ([facility])   . Diabetes mellitus ([facility]) 06/12/2018  . Dyslipidemia 02/13/2015  . Dysrhythmia    AFIB  . Encounter for therapeutic drug monitoring 11/08/2016  . Family history of breast cancer   . Family history of leukemia   . Family history of prostate cancer   . Genetic testing 07/05/2018   Negative genetic testing on the common hereditary cancer panel.  The Hereditary Gene Panel offered by [facility] includes sequencing and/or deletion duplication testing of the following 48 genes: APC, ATM, AXIN2, BARD1, BMPR1A, BRCA1, BRCA2, BRIP1, CDH1, CDK4, CDKN2A (p14ARF), CDKN2A (p16INK4a), CHEK2, CTNNA1, DICER1, EPCAM (Deletion/duplication testing only), GREM1 (promoter region deletion/duplicat  . Hyperlipidemia   . Hypothyroidism due to Hashimoto's thyroiditis 05/23/2015  . Iron deficiency anemia due to chronic blood loss 12/26/2019  . Long term (current) use of anticoagulants [Z79.01] 11/08/2016  . Malignant neoplasm of upper-outer quadrant of right breast in female, estrogen receptor positive ([facility]) 06/26/2018  . Moderate  episode of recurrent major depressive disorder ([facility]) 12/01/2015  . Paroxysmal atrial fibrillation ([facility]) 02/13/2015   Formatting of this note might be different from the original. Chads score 1, chads vas 2, anticoagulated with warfarin  . Personal history of chemotherapy   .  Personal history of radiation therapy   . Port-A-Cath in place 08/24/2018  . Smoking 07/12/2017  . Statin intolerance 12/08/2016  . Type 2 diabetes mellitus without complication, without long-term current use of insulin ([facility]) 05/23/2015    .  G2 P2  Past Surgical History:  Procedure Laterality Date  . ABDOMINAL HYSTERECTOMY    . APPENDECTOMY    . BREAST LUMPECTOMY Right 07/13/2018  . BREAST LUMPECTOMY WITH RADIOACTIVE SEED AND SENTINEL LYMPH NODE BIOPSY Right 07/13/2018   Procedure: RIGHT BREAST LUMPECTOMY WITH RADIOACTIVE SEED AND RIGHT SENTINEL LYMPH NODE MAPPING;  Surgeon: Erroll Luna, MD;  Location: [facility];  Service: General;  Laterality: Right;  . BREAST SURGERY    . CHOLECYSTECTOMY    . KNEE SURGERY-left arthroscopy    . PORT-A-CATH REMOVAL N/A 08/21/2019   Procedure: REMOVAL PORT-A-CATH;  Surgeon: Erroll Luna, MD;  Location: [facility];  Service: General;  Laterality: N/A;  . PORTACATH PLACEMENT Right 08/02/2018   Procedure: INSERTION PORT-A-CATH WITH ULTRASOUND;  Surgeon: Erroll Luna, MD;  Location: [facility];  Service: General;  Laterality: Right;    Medications: Reviewed  Allergies:  Allergies  Allergen Reactions  . Atorvastatin Other (See Comments)    Muscle aches  . Meperidine Nausea And Vomiting    Family history: Her mother had breast cancer.  Her father had prostate cancer.  Her brother had AML.  Maternal aunts and cousins have a history of breast cancer.  Social History:   She lives alone in [place].  She is retired.  She previously worked at an [employer].  She has smoked 2 packs of cigarettes per day for 45-50 years.  No alcohol use.  She was transfused following colon surgery.  ROS:   Positives include: Constipation/diarrhea prior to surgery, generalized weakness prior to colon surgery, intermittent nausea and vomiting prior to surgery, anorexia and 27 pound weight loss prior to the colon surgery  A  complete ROS was otherwise negative.  Physical Exam:  Blood pressure (!) 164/86, pulse 87, temperature 97.8 F (36.6 C), temperature source Tympanic, resp. rate 15, height _0  (1.778 m), weight 152 lb 6.4 oz (69.1 kg), SpO2 99 %.  HEENT: Neck without mass Lungs: Distant breath sounds, scattered bilateral inspiratory/expiratory rhonchi and wheezes, no respiratory distress Cardiac: Regular rate and rhythm Abdomen: No hepatosplenomegaly, healed surgical incisions, nontender, no mass Breast: Right breast without mass, right lumpectomy scar without evidence of recurrent tumor Vascular: No leg edema Lymph nodes: No cervical, supraclavicular, axillary, or inguinal nodes Neurologic: Alert and oriented, the motor exam appears intact in the upper and lower extremities bilaterally Skin: No rash Musculoskeletal: No spine tenderness   LAB:  CBC  Lab Results  Component Value Date   WBC 6.9 03/24/2020   HGB 8.5 (L) 03/24/2020   HCT 29.1 (L) 03/24/2020   MCV 82.9 03/24/2020   PLT 252 03/24/2020   NEUTROABS 5.5 03/24/2020        CMP  Lab Results  Component Value Date   NA 137 08/17/2019   K 4.3 08/17/2019   CL 99 08/17/2019   CO2 31 08/17/2019   GLUCOSE 153 (H) 08/17/2019   BUN 10 08/17/2019   CREATININE 0.54 08/17/2019  CALCIUM 9.2 08/17/2019   PROT 6.3 (L) 08/17/2019   ALBUMIN 3.8 08/17/2019   AST 16 08/17/2019   ALT 14 08/17/2019   ALKPHOS 65 08/17/2019   BILITOT 0.4 08/17/2019   GFRNONAA >60 08/17/2019   GFRAA >60 08/17/2019     Lab Results  Component Value Date   CEA1 2.72 03/24/2020    Imaging: PET images from 03/31/2020-reviewed     Assessment/Plan:   1. 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, stage IIIc pT3pN2b  Moderately differentiated adenocarcinoma, 9/30 lymph nodes positive, lymphovascular invasion present including large vessel extramural invasion, loss of MLH1 and PMS2  Incomplete colonoscopy secondary to stricturing at  the sigmoid colon  Virtual colonoscopy 03/13/2020- mass centered at the medial wall of the cecum and terminal ileum, adjacent ileocolic mesenteric adenopathy, nonspecific 3 mm right lower lobe nodule  PET 18/84/1660 hypermetabolic cecal/terminal ileum mass with adjacent enlarged and hypermetabolic ileocolic lymph nodes, previously noted segment 7 liver lesion-not hypermetabolic- benign etiology favored, dominant left lower lobe pulmonary nodule not hypermetabolic, other tiny nodules not hypermetabolic and below PET sensitivity 2. Iron deficiency anemia secondary to #1 3. Right breast cancer, stage Ia, T1b, N0, ER positive, PR positive, HER2 positive status post a right lumpectomy and adjuvant radiation/Taxol/Herceptin, anastrozole starting 01/25/2019 4. Diabetes 5. Atrial fibrillation-maintained on Coumadin 6. Hypothyroidism 7. Ongoing tobacco use 8.   Family history of breast cancer-negative genetic testing   Disposition:   Ms. Heffner has been diagnosed with right-sided colon cancer after presenting with iron deficiency anemia.  I reviewed details of the surgical pathology report and discussed the prognosis with Ms. Klee and her daughter.  She has a high chance of developing recurrent colon cancer over the next several years.  She may have subclinical metastatic disease at present, as there are indeterminate lung and liver lesions, not hypermetabolic on PET.  I recommend adjuvant chemotherapy.  We discussed the expected benefit associated with adjuvant 5-fluorouracil and oxaliplatin based chemotherapy.  I recommend adjuvant FOLFOX.  We reviewed potential toxicities associated with this regimen including the chance for nausea/vomiting, mucositis, diarrhea, alopecia, and hematologic toxicity.  We discussed the sun sensitivity, rash, hyperpigmentation, and hand/foot syndrome associated with 5-fluorouracil.  We reviewed the allergic reaction and various types of neuropathy associated with  oxaliplatin.  She agrees to proceed.  She will attend a chemotherapy teaching class.  There is loss of MLH1 and PMS2 expression.  We will follow-up on MSI testing.  This pattern is most commonly seen in sporadic tumors.  She most likely does not have hereditary polyposis colon cancer syndrome and she has undergone previous negative genetic testing.  However her family members are at increased risk of developing colorectal cancer and should receive appropriate screening.  She may be a candidate for the ATOMIC study which randomizes patients to FOLFOX +/-atezolizumab.  We contacted the research nurses to investigate her eligibility for the study.  She may be ineligible due to the history of breast cancer.  We will asked Dr. Lilia [name] to place a Port-A-Cath.  The plan is to begin FOLFOX on 05/12/2020.  I will present her case at the GI tumor conference.  A chemotherapy plan was entered today.

## 2020-04-29 NOTE — Progress Notes (Signed)
Met with patient and her daughter Estill Bamberg at her initial medical oncology consult with Dr. Julieanne Manson.  I had previously met with the patient prior to her colon surgery.  She has my direct contact information.    I placed a call to Dr. Lilia Pro [facility]/[place] and requested he place a port a cath for [place] to start treatment in two weeks.  Per Pam in his office it will most likely be done on 1/24 or 1/27 and she will contact the patient.

## 2020-04-30 ENCOUNTER — Other Ambulatory Visit: Payer: Self-pay

## 2020-04-30 ENCOUNTER — Telehealth: Payer: Self-pay | Admitting: Oncology

## 2020-04-30 DIAGNOSIS — C182 Malignant neoplasm of ascending colon: Secondary | ICD-10-CM

## 2020-04-30 NOTE — Progress Notes (Signed)
The proposed treatment discussed in conference is for discussion purposes only and is not a binding recommendation.  The patients have not been physically examined, or presented with their treatment options.  Therefore, final treatment plans cannot be decided.   

## 2020-04-30 NOTE — Telephone Encounter (Signed)
Scheduled appointments per 1/18 los. Called patient, no answer. Left message with appointments dates and times.  

## 2020-05-01 NOTE — Progress Notes (Signed)
Pathology report including MSI testing results to HIM to be scanned to the chart.

## 2020-05-05 ENCOUNTER — Other Ambulatory Visit: Payer: Self-pay | Admitting: *Deleted

## 2020-05-05 ENCOUNTER — Other Ambulatory Visit: Payer: Self-pay

## 2020-05-05 ENCOUNTER — Inpatient Hospital Stay: Payer: Medicare Other

## 2020-05-05 DIAGNOSIS — I4891 Unspecified atrial fibrillation: Secondary | ICD-10-CM | POA: Diagnosis not present

## 2020-05-05 DIAGNOSIS — Z170 Estrogen receptor positive status [ER+]: Secondary | ICD-10-CM | POA: Diagnosis not present

## 2020-05-05 DIAGNOSIS — Z806 Family history of leukemia: Secondary | ICD-10-CM | POA: Diagnosis not present

## 2020-05-05 DIAGNOSIS — C182 Malignant neoplasm of ascending colon: Secondary | ICD-10-CM

## 2020-05-05 DIAGNOSIS — Z5111 Encounter for antineoplastic chemotherapy: Secondary | ICD-10-CM | POA: Diagnosis present

## 2020-05-05 DIAGNOSIS — Z79811 Long term (current) use of aromatase inhibitors: Secondary | ICD-10-CM | POA: Diagnosis not present

## 2020-05-05 DIAGNOSIS — Z9221 Personal history of antineoplastic chemotherapy: Secondary | ICD-10-CM | POA: Diagnosis not present

## 2020-05-05 DIAGNOSIS — C180 Malignant neoplasm of cecum: Secondary | ICD-10-CM | POA: Diagnosis present

## 2020-05-05 DIAGNOSIS — E119 Type 2 diabetes mellitus without complications: Secondary | ICD-10-CM | POA: Diagnosis not present

## 2020-05-05 DIAGNOSIS — F17210 Nicotine dependence, cigarettes, uncomplicated: Secondary | ICD-10-CM | POA: Diagnosis not present

## 2020-05-05 DIAGNOSIS — Z90710 Acquired absence of both cervix and uterus: Secondary | ICD-10-CM | POA: Diagnosis not present

## 2020-05-05 DIAGNOSIS — I480 Paroxysmal atrial fibrillation: Secondary | ICD-10-CM

## 2020-05-05 DIAGNOSIS — E785 Hyperlipidemia, unspecified: Secondary | ICD-10-CM | POA: Diagnosis not present

## 2020-05-05 DIAGNOSIS — Z803 Family history of malignant neoplasm of breast: Secondary | ICD-10-CM | POA: Diagnosis not present

## 2020-05-05 DIAGNOSIS — Z923 Personal history of irradiation: Secondary | ICD-10-CM | POA: Diagnosis not present

## 2020-05-05 DIAGNOSIS — C50411 Malignant neoplasm of upper-outer quadrant of right female breast: Secondary | ICD-10-CM | POA: Diagnosis present

## 2020-05-05 DIAGNOSIS — Z7901 Long term (current) use of anticoagulants: Secondary | ICD-10-CM | POA: Diagnosis not present

## 2020-05-05 DIAGNOSIS — E039 Hypothyroidism, unspecified: Secondary | ICD-10-CM | POA: Diagnosis not present

## 2020-05-05 DIAGNOSIS — I10 Essential (primary) hypertension: Secondary | ICD-10-CM | POA: Diagnosis not present

## 2020-05-05 DIAGNOSIS — Z9049 Acquired absence of other specified parts of digestive tract: Secondary | ICD-10-CM | POA: Diagnosis not present

## 2020-05-05 DIAGNOSIS — D500 Iron deficiency anemia secondary to blood loss (chronic): Secondary | ICD-10-CM | POA: Diagnosis not present

## 2020-05-05 DIAGNOSIS — Z23 Encounter for immunization: Secondary | ICD-10-CM | POA: Diagnosis not present

## 2020-05-05 DIAGNOSIS — Z79899 Other long term (current) drug therapy: Secondary | ICD-10-CM | POA: Diagnosis not present

## 2020-05-05 DIAGNOSIS — Z8042 Family history of malignant neoplasm of prostate: Secondary | ICD-10-CM | POA: Diagnosis not present

## 2020-05-05 LAB — CBC WITH DIFFERENTIAL (CANCER CENTER ONLY)
Abs Immature Granulocytes: 0.01 10*3/uL (ref 0.00–0.07)
Basophils Absolute: 0.1 10*3/uL (ref 0.0–0.1)
Basophils Relative: 1 %
Eosinophils Absolute: 0.1 10*3/uL (ref 0.0–0.5)
Eosinophils Relative: 1 %
HCT: 33 % — ABNORMAL LOW (ref 36.0–46.0)
Hemoglobin: 9.5 g/dL — ABNORMAL LOW (ref 12.0–15.0)
Immature Granulocytes: 0 %
Lymphocytes Relative: 21 %
Lymphs Abs: 1.4 10*3/uL (ref 0.7–4.0)
MCH: 22.7 pg — ABNORMAL LOW (ref 26.0–34.0)
MCHC: 28.8 g/dL — ABNORMAL LOW (ref 30.0–36.0)
MCV: 78.9 fL — ABNORMAL LOW (ref 80.0–100.0)
Monocytes Absolute: 0.4 10*3/uL (ref 0.1–1.0)
Monocytes Relative: 7 %
Neutro Abs: 4.5 10*3/uL (ref 1.7–7.7)
Neutrophils Relative %: 70 %
Platelet Count: 313 10*3/uL (ref 150–400)
RBC: 4.18 MIL/uL (ref 3.87–5.11)
RDW: 19.8 % — ABNORMAL HIGH (ref 11.5–15.5)
WBC Count: 6.4 10*3/uL (ref 4.0–10.5)
nRBC: 0 % (ref 0.0–0.2)

## 2020-05-05 LAB — CMP (CANCER CENTER ONLY)
ALT: 7 U/L (ref 0–44)
AST: 13 U/L — ABNORMAL LOW (ref 15–41)
Albumin: 3.7 g/dL (ref 3.5–5.0)
Alkaline Phosphatase: 91 U/L (ref 38–126)
Anion gap: 6 (ref 5–15)
BUN: 7 mg/dL — ABNORMAL LOW (ref 8–23)
CO2: 29 mmol/L (ref 22–32)
Calcium: 9 mg/dL (ref 8.9–10.3)
Chloride: 105 mmol/L (ref 98–111)
Creatinine: 0.56 mg/dL (ref 0.44–1.00)
GFR, Estimated: >60 mL/min (ref >60)
Glucose, Bld: 103 mg/dL — ABNORMAL HIGH (ref 70–99)
Potassium: 3.7 mmol/L (ref 3.5–5.1)
Sodium: 140 mmol/L (ref 135–145)
Total Bilirubin: <0.2 mg/dL — ABNORMAL LOW (ref 0.3–1.2)
Total Protein: 6.8 g/dL (ref 6.5–8.1)

## 2020-05-05 LAB — PROTIME-INR
INR: 2.5 — ABNORMAL HIGH (ref 0.8–1.2)
Prothrombin Time: 26.4 seconds — ABNORMAL HIGH (ref 11.4–15.2)

## 2020-05-05 LAB — CEA (IN HOUSE-CHCC): CEA (CHCC-In House): 2.37 ng/mL (ref 0.00–5.00)

## 2020-05-05 MED ORDER — PROCHLORPERAZINE MALEATE 10 MG PO TABS: 10.0000 mg | ORAL_TABLET | Freq: Four times a day (QID) | ORAL | 1 refills | Status: DC | PRN

## 2020-05-05 MED ORDER — LIDOCAINE-PRILOCAINE 2.5-2.5 % EX CREA
1.0000 | TOPICAL_CREAM | CUTANEOUS | 0 refills | Status: DC | PRN
Start: 2020-05-05 — End: 2021-01-01

## 2020-05-05 NOTE — Research (Signed)
[facility] [id number] (ATOMIC)  Email to ATOMIC Study Chair inquiring about Hashimoto's thyroiditis (considered an autoimmune disorder?) and current use of anastrozole for 2020 breast cancer diagnosis - would this patient still be eligible for ATOMIC participation? Response received the same day stating the thyroid disease would not disqualify the patient however, endocrine therapy for breast cancer renders the patient ineligible. Patient would have to stop endocrine therapy to participate. Dr. Benay Spice notified of study response.   Dionne Bucy. Sharlett Iles, BSN, RN, CIC 05/05/2020 10:31 AM

## 2020-05-06 ENCOUNTER — Ambulatory Visit (INDEPENDENT_AMBULATORY_CARE_PROVIDER_SITE_OTHER): Payer: Medicare Other

## 2020-05-06 ENCOUNTER — Ambulatory Visit: Payer: Medicare Other | Admitting: Oncology

## 2020-05-06 DIAGNOSIS — Z7901 Long term (current) use of anticoagulants: Secondary | ICD-10-CM

## 2020-05-06 DIAGNOSIS — Z5181 Encounter for therapeutic drug level monitoring: Secondary | ICD-10-CM | POA: Diagnosis not present

## 2020-05-06 DIAGNOSIS — I480 Paroxysmal atrial fibrillation: Secondary | ICD-10-CM | POA: Diagnosis not present

## 2020-05-06 LAB — POCT INR: INR: 2.6 (ref 2.0–3.0)

## 2020-05-06 NOTE — Patient Instructions (Signed)
Continue 10mg  daily except 5mg  on Mondays, Wednesdays and Saturdays.  Recheck INR in 2 weeks. Holding for [facility] placement on 1/27

## 2020-05-07 ENCOUNTER — Other Ambulatory Visit: Payer: Self-pay | Admitting: Oncology

## 2020-05-07 NOTE — Progress Notes (Signed)
DISCONTINUE ON PATHWAY REGIMEN - Breast   Paclitaxel Weekly + Trastuzumab Weekly:   Administer weekly:     Paclitaxel      Trastuzumab-xxxx      Trastuzumab-xxxx   **Always confirm dose/schedule in your pharmacy ordering system**  Trastuzumab (Maintenance - NO Loading Dose):   A cycle is every 21 days:     Trastuzumab-xxxx   **Always confirm dose/schedule in your pharmacy ordering system**  REASON: Other Reason PRIOR TREATMENT: [id number]: Weekly Paclitaxel + Trastuzumab x 12 Weeks, Followed by Trastuzumab Maintenance q21 Days x 13 Cycles TREATMENT RESPONSE: Unable to Evaluate  START OFF PATHWAY REGIMEN - Other   [id number] (Leucovorin IV D1 + Fluorouracil IV D1/CIV D1,2 + Oxaliplatin IV D1) q14 Days:   A cycle is every 14 days:     Oxaliplatin      Leucovorin      Fluorouracil      Fluorouracil   **Always confirm dose/schedule in your pharmacy ordering system**  Patient Characteristics: Intent of Therapy: Curative Intent, Discussed with Patient

## 2020-05-09 NOTE — Progress Notes (Signed)
Pharmacist Chemotherapy Monitoring - Initial Assessment    Anticipated start date: 05/12/20   Regimen:  . Are orders appropriate based on the patient's diagnosis, regimen, and cycle? Yes . Does the plan date match the patient's scheduled date? Yes . Is the sequencing of drugs appropriate? Yes . Are the premedications appropriate for the patient's regimen? Yes . Prior Authorization for treatment is: Approved o If applicable, is the correct biosimilar selected based on the patient's insurance? not applicable  Organ Function and Labs: Marland Kitchen Are dose adjustments needed based on the patient's renal function, hepatic function, or hematologic function? Yes . Are appropriate labs ordered prior to the start of patient's treatment? Yes . Other organ system assessment, if indicated: N/A . The following baseline labs, if indicated, have been ordered: N/A  Dose Assessment: . Are the drug doses appropriate? Yes . Are the following correct: o Drug concentrations Yes o IV fluid compatible with drug Yes o Administration routes Yes o Timing of therapy Yes . If applicable, does the patient have documented access for treatment and/or plans for port-a-cath placement? yes . If applicable, have lifetime cumulative doses been properly documented and assessed? not applicable Lifetime Dose Tracking  No doses have been documented on this patient for the following tracked chemicals: Doxorubicin, Epirubicin, Idarubicin, Daunorubicin, Mitoxantrone, Bleomycin, Oxaliplatin, Carboplatin, Liposomal Doxorubicin  o   Toxicity Monitoring/Prevention: . The patient has the following take home antiemetics prescribed: Prochlorperazine . The patient has the following take home medications prescribed: N/A . Medication allergies and previous infusion related reactions, if applicable, have been reviewed and addressed. Yes . The patient's current medication list has been assessed for drug-drug interactions with their chemotherapy  regimen. no significant drug-drug interactions were identified on review.  Order Review: . Are the treatment plan orders signed? Yes . Is the patient scheduled to see a provider prior to their treatment? No  I verify that I have reviewed each item in the above checklist and answered each question accordingly.  Philomena Course 05/09/2020 1:21 PM

## 2020-05-12 ENCOUNTER — Other Ambulatory Visit: Payer: Self-pay

## 2020-05-12 ENCOUNTER — Other Ambulatory Visit: Payer: Medicare Other

## 2020-05-12 ENCOUNTER — Inpatient Hospital Stay: Payer: Medicare Other

## 2020-05-12 ENCOUNTER — Inpatient Hospital Stay (HOSPITAL_BASED_OUTPATIENT_CLINIC_OR_DEPARTMENT_OTHER): Payer: Medicare Other | Admitting: Oncology

## 2020-05-12 ENCOUNTER — Encounter (HOSPITAL_COMMUNITY): Payer: Self-pay

## 2020-05-12 ENCOUNTER — Telehealth: Payer: Self-pay

## 2020-05-12 VITALS — BP 137/70 | HR 65 | Temp 97.2°F | Resp 16 | Ht 70.0 in | Wt 152.3 lb

## 2020-05-12 DIAGNOSIS — C182 Malignant neoplasm of ascending colon: Secondary | ICD-10-CM

## 2020-05-12 DIAGNOSIS — I480 Paroxysmal atrial fibrillation: Secondary | ICD-10-CM

## 2020-05-12 DIAGNOSIS — Z170 Estrogen receptor positive status [ER+]: Secondary | ICD-10-CM

## 2020-05-12 DIAGNOSIS — Z95828 Presence of other vascular implants and grafts: Secondary | ICD-10-CM

## 2020-05-12 DIAGNOSIS — C50411 Malignant neoplasm of upper-outer quadrant of right female breast: Secondary | ICD-10-CM

## 2020-05-12 DIAGNOSIS — Z5111 Encounter for antineoplastic chemotherapy: Secondary | ICD-10-CM | POA: Diagnosis not present

## 2020-05-12 DIAGNOSIS — Z23 Encounter for immunization: Secondary | ICD-10-CM

## 2020-05-12 MED ORDER — DEXAMETHASONE SODIUM PHOSPHATE 100 MG/10ML IJ SOLN
10.0000 mg | Freq: Once | INTRAMUSCULAR | Status: AC
  Administered 2020-05-12: 10 mg via INTRAVENOUS
  Filled 2020-05-12: qty 10

## 2020-05-12 MED ORDER — PALONOSETRON HCL INJECTION 0.25 MG/5ML
INTRAVENOUS | Status: AC
  Filled 2020-05-12: qty 5

## 2020-05-12 MED ORDER — SODIUM CHLORIDE 0.9% FLUSH
10.0000 mL | Freq: Once | INTRAVENOUS | Status: AC
  Administered 2020-05-12: 10 mL via INTRAVENOUS
  Filled 2020-05-12: qty 10

## 2020-05-12 MED ORDER — LEUCOVORIN CALCIUM INJECTION 350 MG
400.0000 mg/m2 | Freq: Once | INTRAVENOUS | Status: AC
  Administered 2020-05-12: 740 mg via INTRAVENOUS
  Filled 2020-05-12: qty 37

## 2020-05-12 MED ORDER — FLUOROURACIL CHEMO INJECTION 2.5 GM/50ML
400.0000 mg/m2 | Freq: Once | INTRAVENOUS | Status: AC
Start: 2020-05-12 — End: 2020-05-12
  Administered 2020-05-12: 750 mg via INTRAVENOUS
  Filled 2020-05-12: qty 15

## 2020-05-12 MED ORDER — OXALIPLATIN CHEMO INJECTION 100 MG/20ML
82.0000 mg/m2 | Freq: Once | INTRAVENOUS | Status: AC
  Administered 2020-05-12: 150 mg via INTRAVENOUS
  Filled 2020-05-12: qty 30

## 2020-05-12 MED ORDER — DEXTROSE 5 % IV SOLN
Freq: Once | INTRAVENOUS | Status: AC
Start: 2020-05-12 — End: 2020-05-12
  Filled 2020-05-12: qty 250

## 2020-05-12 MED ORDER — FLUOROURACIL CHEMO INJECTION 5 GM/100ML
2400.0000 mg/m2 | INTRAVENOUS | Status: DC
  Administered 2020-05-12: 4450 mg via INTRAVENOUS
  Filled 2020-05-12: qty 89

## 2020-05-12 MED ORDER — DEXTROSE 5 % IV SOLN
Freq: Once | INTRAVENOUS | Status: AC
  Filled 2020-05-12: qty 250

## 2020-05-12 MED ORDER — PALONOSETRON HCL INJECTION 0.25 MG/5ML
0.2500 mg | Freq: Once | INTRAVENOUS | Status: AC
  Administered 2020-05-12: 0.25 mg via INTRAVENOUS

## 2020-05-12 NOTE — Progress Notes (Signed)
  [facility] OFFICE PROGRESS NOTE   Diagnosis: Colon cancer  INTERVAL HISTORY:   Sabrina [name] returns for a scheduled visit.  She reports feeling the best that she has in months.  She underwent Port-A-Cath placement by Dr. Lilia Pro last week.  She has attended a chemotherapy teaching class.  She did not qualify for the ATOMIC study due to the history of breast cancer.  Objective:  Vital signs in last 24 hours:  Blood pressure 137/70, pulse 65, temperature (!) 97.2 F (36.2 C), temperature source Tympanic, resp. rate 16, height $RemoveBe'5\' 10"'keOHMEcnj$  (1.778 m), weight 152 lb 4.8 oz (69.1 kg), SpO2 96 %.    Resp: Scattered bilateral inspiratory wheeze and rhonchi, no respiratory distress Cardio: Regular rate and rhythm GI: Nontender, healed surgical incisions Vascular: No leg edema, the left lower leg is slightly larger than the right side, bilateral lower leg varicosities   Portacath/PICC-without erythema  Lab Results:  Lab Results  Component Value Date   WBC 6.4 05/05/2020   HGB 9.5 (L) 05/05/2020   HCT 33.0 (L) 05/05/2020   MCV 78.9 (L) 05/05/2020   PLT 313 05/05/2020   NEUTROABS 4.5 05/05/2020    CMP  Lab Results  Component Value Date   NA 140 05/05/2020   K 3.7 05/05/2020   CL 105 05/05/2020   CO2 29 05/05/2020   GLUCOSE 103 (H) 05/05/2020   BUN 7 (L) 05/05/2020   CREATININE 0.56 05/05/2020   CALCIUM 9.0 05/05/2020   PROT 6.8 05/05/2020   ALBUMIN 3.7 05/05/2020   AST 13 (L) 05/05/2020   ALT 7 05/05/2020   ALKPHOS 91 05/05/2020   BILITOT <0.2 (L) 05/05/2020   GFRNONAA >60 05/05/2020   GFRAA >60 08/17/2019    Lab Results  Component Value Date   CEA1 2.37 05/05/2020    Lab Results  Component Value Date   INR 2.6 05/06/2020    Medications: I have reviewed the patient's current medications.   Assessment/Plan: 1. 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, stage IIIc pT3pN2b  Moderately differentiated adenocarcinoma, 9/30 lymph nodes  positive, lymphovascular invasion present including large vessel extramural invasion, loss of MLH1 and PMS2  Incomplete colonoscopy secondary to stricturing at the sigmoid colon  Virtual colonoscopy 03/13/2020- mass centered at the medial wall of the cecum and terminal ileum, adjacent ileocolic mesenteric adenopathy, nonspecific 3 mm right lower lobe nodule  PET 85/46/2703 hypermetabolic cecal/terminal ileum mass with adjacent enlarged and hypermetabolic ileocolic lymph nodes, previously noted segment 7 liver lesion-not hypermetabolic- benign etiology favored, dominant left lower lobe pulmonary nodule not hypermetabolic, other tiny nodules not hypermetabolic and below PET sensitivity  Cycle 1 FOLFOX 05/12/2020 2. Iron deficiency anemia secondary to #1 3. Right breast cancer, stage Ia, T1b, N0, ER positive, PR positive, HER2 positive status post a right lumpectomy and adjuvant radiation/Taxol/Herceptin, anastrozole starting 01/25/2019 4. Diabetes 5. Atrial fibrillation-maintained on Coumadin 6. Hypothyroidism 7. Ongoing tobacco use 8.   Family history of breast cancer-negative genetic testing     Disposition: Sabrina [name] appears stable.  She will complete cycle 1 of adjuvant FOLFOX today.  We will follow up on results from [facility] methylation testing.  She most likely has a sporadic tumor, but will be a candidate for immunotherapy if she develops recurrent disease.  We will plan for a restaging chest CT in 3 months.  Sabrina [name] will receive a COVID-19 booster vaccine today.  Betsy Coder, MD  05/12/2020  9:20 AM

## 2020-05-12 NOTE — Progress Notes (Signed)
   Covid-19 Vaccination Clinic  Name:  Yicel Shannon    MRN: [id number] DOB: 1951-02-02  05/12/2020  Ms. Schum was observed post Covid-19 immunization for 15 minutes without incident. She was provided with Vaccine Information Sheet and instruction to access the V-Safe system.   Ms. Mcclees was instructed to call 911 with any severe reactions post vaccine: Marland Kitchen Difficulty breathing  . Swelling of face and throat  . A fast heartbeat  . A bad rash all over body  . Dizziness and weakness

## 2020-05-12 NOTE — Telephone Encounter (Signed)
Per Dr. Julieanne Manson I have faxed a request to [facility] Pathology Dept at fax [phone] requesting MLH1 methylation testing from [employer] [id number] collected 04/14/2020.

## 2020-05-12 NOTE — Progress Notes (Signed)
Per discussion with Ned Card NP on 05/09/20 pt will not need additional labs drawn on Monday 05/12/20 prior to treatment .

## 2020-05-12 NOTE — Patient Instructions (Signed)
[facility] Discharge Instructions for Patients Receiving Chemotherapy  Today you received the following chemotherapy agents Oxaliplatin, leucovorin, and 5FU  To help prevent nausea and vomiting after your treatment, we encourage you to take your nausea medication as directed If you develop nausea and vomiting that is not controlled by your nausea medication, call the clinic.   BELOW ARE SYMPTOMS THAT SHOULD BE REPORTED IMMEDIATELY:  *FEVER GREATER THAN 100.5 F  *CHILLS WITH OR WITHOUT FEVER  NAUSEA AND VOMITING THAT IS NOT CONTROLLED WITH YOUR NAUSEA MEDICATION  *UNUSUAL SHORTNESS OF BREATH  *UNUSUAL BRUISING OR BLEEDING  TENDERNESS IN MOUTH AND THROAT WITH OR WITHOUT PRESENCE OF ULCERS  *URINARY PROBLEMS  *BOWEL PROBLEMS  UNUSUAL RASH Items with * indicate a potential emergency and should be followed up as soon as possible.  Feel free to call the clinic should you have any questions or concerns. The clinic phone number is [phone].  Please show the [facility] at check-in to the Emergency Department and triage nurse.   Oxaliplatin Injection What is this medicine? OXALIPLATIN (ox AL i PLA tin) is a chemotherapy drug. It targets fast dividing cells, like cancer cells, and causes these cells to die. This medicine is used to treat cancers of the colon and rectum, and many other cancers. This medicine may be used for other purposes; ask your health care provider or pharmacist if you have questions. COMMON BRAND NAME(S): Eloxatin What should I tell my health care provider before I take this medicine? They need to know if you have any of these conditions:  heart disease  history of irregular heartbeat  liver disease  low blood counts, like white cells, platelets, or red blood cells  lung or breathing disease, like asthma  take medicines that treat or prevent blood clots  tingling of the fingers or toes, or other nerve disorder  an unusual or  allergic reaction to oxaliplatin, other chemotherapy, other medicines, foods, dyes, or preservatives  pregnant or trying to get pregnant  breast-feeding How should I use this medicine? This drug is given as an infusion into a vein. It is administered in a hospital or clinic by a specially trained health care professional. Talk to your pediatrician regarding the use of this medicine in children. Special care may be needed. Overdosage: If you think you have taken too much of this medicine contact a poison control center or emergency room at once. NOTE: This medicine is only for you. Do not share this medicine with others. What if I miss a dose? It is important not to miss a dose. Call your doctor or health care professional if you are unable to keep an appointment. What may interact with this medicine? Do not take this medicine with any of the following medications:  cisapride  dronedarone  pimozide  thioridazine This medicine may also interact with the following medications:  aspirin and aspirin-like medicines  certain medicines that treat or prevent blood clots like warfarin, apixaban, dabigatran, and rivaroxaban  cisplatin  cyclosporine  diuretics  medicines for infection like acyclovir, adefovir, amphotericin B, bacitracin, cidofovir, foscarnet, ganciclovir, gentamicin, pentamidine, vancomycin  NSAIDs, medicines for pain and inflammation, like ibuprofen or naproxen  other medicines that prolong the QT interval (an abnormal heart rhythm)  pamidronate  zoledronic acid This list may not describe all possible interactions. Give your health care provider a list of all the medicines, herbs, non-prescription drugs, or dietary supplements you use. Also tell them if you smoke, drink alcohol,  or use illegal drugs. Some items may interact with your medicine. What should I watch for while using this medicine? Your condition will be monitored carefully while you are receiving this  medicine. You may need blood work done while you are taking this medicine. This medicine may make you feel generally unwell. This is not uncommon as chemotherapy can affect healthy cells as well as cancer cells. Report any side effects. Continue your course of treatment even though you feel ill unless your healthcare professional tells you to stop. This medicine can make you more sensitive to cold. Do not drink cold drinks or use ice. Cover exposed skin before coming in contact with cold temperatures or cold objects. When out in cold weather wear warm clothing and cover your mouth and nose to warm the air that goes into your lungs. Tell your doctor if you get sensitive to the cold. Do not become pregnant while taking this medicine or for 9 months after stopping it. Women should inform their health care professional if they wish to become pregnant or think they might be pregnant. Men should not father a child while taking this medicine and for 6 months after stopping it. There is potential for serious side effects to an unborn child. Talk to your health care professional for more information. Do not breast-feed a child while taking this medicine or for 3 months after stopping it. This medicine has caused ovarian failure in some women. This medicine may make it more difficult to get pregnant. Talk to your health care professional if you are concerned about your fertility. This medicine has caused decreased sperm counts in some men. This may make it more difficult to father a child. Talk to your health care professional if you are concerned about your fertility. This medicine may increase your risk of getting an infection. Call your health care professional for advice if you get a fever, chills, or sore throat, or other symptoms of a cold or flu. Do not treat yourself. Try to avoid being around people who are sick. Avoid taking medicines that contain aspirin, acetaminophen, ibuprofen, naproxen, or ketoprofen  unless instructed by your health care professional. These medicines may hide a fever. Be careful brushing or flossing your teeth or using a toothpick because you may get an infection or bleed more easily. If you have any dental work done, tell your dentist you are receiving this medicine. What side effects may I notice from receiving this medicine? Side effects that you should report to your doctor or health care professional as soon as possible:  allergic reactions like skin rash, itching or hives, swelling of the face, lips, or tongue  breathing problems  cough  low blood counts - this medicine may decrease the number of white blood cells, red blood cells, and platelets. You may be at increased risk for infections and bleeding  nausea, vomiting  pain, redness, or irritation at site where injected  pain, tingling, numbness in the hands or feet  signs and symptoms of bleeding such as bloody or black, tarry stools; red or dark brown urine; spitting up blood or brown material that looks like coffee grounds; red spots on the skin; unusual bruising or bleeding from the eyes, gums, or nose  signs and symptoms of a dangerous change in heartbeat or heart rhythm like chest pain; dizziness; fast, irregular heartbeat; palpitations; feeling faint or lightheaded; falls  signs and symptoms of infection like fever; chills; cough; sore throat; pain or trouble passing urine    signs and symptoms of liver injury like dark yellow or brown urine; general ill feeling or flu-like symptoms; light-colored stools; loss of appetite; nausea; right upper belly pain; unusually weak or tired; yellowing of the eyes or skin  signs and symptoms of low red blood cells or anemia such as unusually weak or tired; feeling faint or lightheaded; falls  signs and symptoms of muscle injury like dark urine; trouble passing urine or change in the amount of urine; unusually weak or tired; muscle pain; back pain Side effects that  usually do not require medical attention (report to your doctor or health care professional if they continue or are bothersome):  changes in taste  diarrhea  gas  hair loss  loss of appetite  mouth sores This list may not describe all possible side effects. Call your doctor for medical advice about side effects. You may report side effects to FDA at 1-800-FDA-1088. Where should I keep my medicine? This drug is given in a hospital or clinic and will not be stored at home. NOTE: This sheet is a summary. It may not cover all possible information. If you have questions about this medicine, talk to your doctor, pharmacist, or health care provider.  2021 [employer]vier/Gold Standard (2018-08-16 12:20:35)   Leucovorin injection What is this medicine? LEUCOVORIN (loo koe VOR in) is used to prevent or treat the harmful effects of some medicines. This medicine is used to treat anemia caused by a low amount of folic acid in the body. It is also used with 5-fluorouracil (5-FU) to treat colon cancer. This medicine may be used for other purposes; ask your health care provider or pharmacist if you have questions. What should I tell my health care provider before I take this medicine? They need to know if you have any of these conditions:  anemia from low levels of vitamin B-12 in the blood  an unusual or allergic reaction to leucovorin, folic acid, other medicines, foods, dyes, or preservatives  pregnant or trying to get pregnant  breast-feeding How should I use this medicine? This medicine is for injection into a muscle or into a vein. It is given by a health care professional in a hospital or clinic setting. Talk to your pediatrician regarding the use of this medicine in children. Special care may be needed. Overdosage: If you think you have taken too much of this medicine contact a poison control center or emergency room at once. NOTE: This medicine is only for you. Do not share this medicine  with others. What if I miss a dose? This does not apply. What may interact with this medicine?  capecitabine  fluorouracil  phenobarbital  phenytoin  primidone  trimethoprim-sulfamethoxazole This list may not describe all possible interactions. Give your health care provider a list of all the medicines, herbs, non-prescription drugs, or dietary supplements you use. Also tell them if you smoke, drink alcohol, or use illegal drugs. Some items may interact with your medicine. What should I watch for while using this medicine? Your condition will be monitored carefully while you are receiving this medicine. This medicine may increase the side effects of 5-fluorouracil, 5-FU. Tell your doctor or health care professional if you have diarrhea or mouth sores that do not get better or that get worse. What side effects may I notice from receiving this medicine? Side effects that you should report to your doctor or health care professional as soon as possible:  allergic reactions like skin rash, itching or hives, swelling of the  face, lips, or tongue  breathing problems  fever, infection  mouth sores  unusual bleeding or bruising  unusually weak or tired Side effects that usually do not require medical attention (report to your doctor or health care professional if they continue or are bothersome):  constipation or diarrhea  loss of appetite  nausea, vomiting This list may not describe all possible side effects. Call your doctor for medical advice about side effects. You may report side effects to FDA at 1-800-FDA-1088. Where should I keep my medicine? This drug is given in a hospital or clinic and will not be stored at home. NOTE: This sheet is a summary. It may not cover all possible information. If you have questions about this medicine, talk to your doctor, pharmacist, or health care provider.  2021 Elsevier/Gold Standard (2007-10-03 16:50:29)  Fluorouracil, 5-FU  injection What is this medicine? FLUOROURACIL, 5-FU (flure oh YOOR a sil) is a chemotherapy drug. It slows the growth of cancer cells. This medicine is used to treat many types of cancer like breast cancer, colon or rectal cancer, pancreatic cancer, and stomach cancer. This medicine may be used for other purposes; ask your health care provider or pharmacist if you have questions. COMMON BRAND NAME(S): Adrucil What should I tell my health care provider before I take this medicine? They need to know if you have any of these conditions:  blood disorders  dihydropyrimidine dehydrogenase (DPD) deficiency  infection (especially a virus infection such as chickenpox, cold sores, or herpes)  kidney disease  liver disease  malnourished, poor nutrition  recent or ongoing radiation therapy  an unusual or allergic reaction to fluorouracil, other chemotherapy, other medicines, foods, dyes, or preservatives  pregnant or trying to get pregnant  breast-feeding How should I use this medicine? This drug is given as an infusion or injection into a vein. It is administered in a hospital or clinic by a specially trained health care professional. Talk to your pediatrician regarding the use of this medicine in children. Special care may be needed. Overdosage: If you think you have taken too much of this medicine contact a poison control center or emergency room at once. NOTE: This medicine is only for you. Do not share this medicine with others. What if I miss a dose? It is important not to miss your dose. Call your doctor or health care professional if you are unable to keep an appointment. What may interact with this medicine? Do not take this medicine with any of the following medications:  live virus vaccines This medicine may also interact with the following medications:  medicines that treat or prevent blood clots like warfarin, enoxaparin, and dalteparin This list may not describe all  possible interactions. Give your health care provider a list of all the medicines, herbs, non-prescription drugs, or dietary supplements you use. Also tell them if you smoke, drink alcohol, or use illegal drugs. Some items may interact with your medicine. What should I watch for while using this medicine? Visit your doctor for checks on your progress. This drug may make you feel generally unwell. This is not uncommon, as chemotherapy can affect healthy cells as well as cancer cells. Report any side effects. Continue your course of treatment even though you feel ill unless your doctor tells you to stop. In some cases, you may be given additional medicines to help with side effects. Follow all directions for their use. Call your doctor or health care professional for advice if you get a fever, chills  or sore throat, or other symptoms of a cold or flu. Do not treat yourself. This drug decreases your body's ability to fight infections. Try to avoid being around people who are sick. This medicine may increase your risk to bruise or bleed. Call your doctor or health care professional if you notice any unusual bleeding. Be careful brushing and flossing your teeth or using a toothpick because you may get an infection or bleed more easily. If you have any dental work done, tell your dentist you are receiving this medicine. Avoid taking products that contain aspirin, acetaminophen, ibuprofen, naproxen, or ketoprofen unless instructed by your doctor. These medicines may hide a fever. Do not become pregnant while taking this medicine. Women should inform their doctor if they wish to become pregnant or think they might be pregnant. There is a potential for serious side effects to an unborn child. Talk to your health care professional or pharmacist for more information. Do not breast-feed an infant while taking this medicine. Men should inform their doctor if they wish to father a child. This medicine may lower sperm  counts. Do not treat diarrhea with over the counter products. Contact your doctor if you have diarrhea that lasts more than 2 days or if it is severe and watery. This medicine can make you more sensitive to the sun. Keep out of the sun. If you cannot avoid being in the sun, wear protective clothing and use sunscreen. Do not use sun lamps or tanning beds/booths. What side effects may I notice from receiving this medicine? Side effects that you should report to your doctor or health care professional as soon as possible:  allergic reactions like skin rash, itching or hives, swelling of the face, lips, or tongue  low blood counts - this medicine may decrease the number of white blood cells, red blood cells and platelets. You may be at increased risk for infections and bleeding.  signs of infection - fever or chills, cough, sore throat, pain or difficulty passing urine  signs of decreased platelets or bleeding - bruising, pinpoint red spots on the skin, black, tarry stools, blood in the urine  signs of decreased red blood cells - unusually weak or tired, fainting spells, lightheadedness  breathing problems  changes in vision  chest pain  mouth sores  nausea and vomiting  pain, swelling, redness at site where injected  pain, tingling, numbness in the hands or feet  redness, swelling, or sores on hands or feet  stomach pain  unusual bleeding Side effects that usually do not require medical attention (report to your doctor or health care professional if they continue or are bothersome):  changes in finger or toe nails  diarrhea  dry or itchy skin  hair loss  headache  loss of appetite  sensitivity of eyes to the light  stomach upset  unusually teary eyes This list may not describe all possible side effects. Call your doctor for medical advice about side effects. You may report side effects to FDA at 1-800-FDA-1088. Where should I keep my medicine? This drug is given  in a hospital or clinic and will not be stored at home. NOTE: This sheet is a summary. It may not cover all possible information. If you have questions about this medicine, talk to your doctor, pharmacist, or health care provider.  2021 Elsevier/Gold Standard (2019-02-27 15:00:03)

## 2020-05-12 NOTE — Patient Instructions (Signed)

## 2020-05-12 NOTE — Progress Notes (Signed)
Pt does not need labs drawn today per Donneta Romberg, MD. Pt had labs on 05/05/2020 and has not started treatment. Port accessed and pt arrived to next appt.

## 2020-05-12 NOTE — Patient Instructions (Addendum)
[facility] Discharge Instructions for Patients Receiving Chemotherapy  Today you received the following chemotherapy agents Oxaliplatin, leucovorin, and 5FU  To help prevent nausea and vomiting after your treatment, we encourage you to take your nausea medication as directed   If you develop nausea and vomiting that is not controlled by your nausea medication, call the clinic.   BELOW ARE SYMPTOMS THAT SHOULD BE REPORTED IMMEDIATELY:  *FEVER GREATER THAN 100.5 F  *CHILLS WITH OR WITHOUT FEVER  NAUSEA AND VOMITING THAT IS NOT CONTROLLED WITH YOUR NAUSEA MEDICATION  *UNUSUAL SHORTNESS OF BREATH  *UNUSUAL BRUISING OR BLEEDING  TENDERNESS IN MOUTH AND THROAT WITH OR WITHOUT PRESENCE OF ULCERS  *URINARY PROBLEMS  *BOWEL PROBLEMS  UNUSUAL RASH Items with * indicate a potential emergency and should be followed up as soon as possible.  Feel free to call the clinic should you have any questions or concerns. The clinic phone number is [phone].  Please show the [facility] at check-in to the Emergency Department and triage nurse.  Oxaliplatin Injection What is this medicine? OXALIPLATIN (ox AL i PLA tin) is a chemotherapy drug. It targets fast dividing cells, like cancer cells, and causes these cells to die. This medicine is used to treat cancers of the colon and rectum, and many other cancers. This medicine may be used for other purposes; ask your health care provider or pharmacist if you have questions. COMMON BRAND NAME(S): Eloxatin What should I tell my health care provider before I take this medicine? They need to know if you have any of these conditions:  heart disease  history of irregular heartbeat  liver disease  low blood counts, like white cells, platelets, or red blood cells  lung or breathing disease, like asthma  take medicines that treat or prevent blood clots  tingling of the fingers or toes, or other nerve disorder  an unusual  or allergic reaction to oxaliplatin, other chemotherapy, other medicines, foods, dyes, or preservatives  pregnant or trying to get pregnant  breast-feeding How should I use this medicine? This drug is given as an infusion into a vein. It is administered in a hospital or clinic by a specially trained health care professional. Talk to your pediatrician regarding the use of this medicine in children. Special care may be needed. Overdosage: If you think you have taken too much of this medicine contact a poison control center or emergency room at once. NOTE: This medicine is only for you. Do not share this medicine with others. What if I miss a dose? It is important not to miss a dose. Call your doctor or health care professional if you are unable to keep an appointment. What may interact with this medicine? Do not take this medicine with any of the following medications:  cisapride  dronedarone  pimozide  thioridazine This medicine may also interact with the following medications:  aspirin and aspirin-like medicines  certain medicines that treat or prevent blood clots like warfarin, apixaban, dabigatran, and rivaroxaban  cisplatin  cyclosporine  diuretics  medicines for infection like acyclovir, adefovir, amphotericin B, bacitracin, cidofovir, foscarnet, ganciclovir, gentamicin, pentamidine, vancomycin  NSAIDs, medicines for pain and inflammation, like ibuprofen or naproxen  other medicines that prolong the QT interval (an abnormal heart rhythm)  pamidronate  zoledronic acid This list may not describe all possible interactions. Give your health care provider a list of all the medicines, herbs, non-prescription drugs, or dietary supplements you use. Also tell them if you smoke, drink  alcohol, or use illegal drugs. Some items may interact with your medicine. What should I watch for while using this medicine? Your condition will be monitored carefully while you are receiving  this medicine. You may need blood work done while you are taking this medicine. This medicine may make you feel generally unwell. This is not uncommon as chemotherapy can affect healthy cells as well as cancer cells. Report any side effects. Continue your course of treatment even though you feel ill unless your healthcare professional tells you to stop. This medicine can make you more sensitive to cold. Do not drink cold drinks or use ice. Cover exposed skin before coming in contact with cold temperatures or cold objects. When out in cold weather wear warm clothing and cover your mouth and nose to warm the air that goes into your lungs. Tell your doctor if you get sensitive to the cold. Do not become pregnant while taking this medicine or for 9 months after stopping it. Women should inform their health care professional if they wish to become pregnant or think they might be pregnant. Men should not father a child while taking this medicine and for 6 months after stopping it. There is potential for serious side effects to an unborn child. Talk to your health care professional for more information. Do not breast-feed a child while taking this medicine or for 3 months after stopping it. This medicine has caused ovarian failure in some women. This medicine may make it more difficult to get pregnant. Talk to your health care professional if you are concerned about your fertility. This medicine has caused decreased sperm counts in some men. This may make it more difficult to father a child. Talk to your health care professional if you are concerned about your fertility. This medicine may increase your risk of getting an infection. Call your health care professional for advice if you get a fever, chills, or sore throat, or other symptoms of a cold or flu. Do not treat yourself. Try to avoid being around people who are sick. Avoid taking medicines that contain aspirin, acetaminophen, ibuprofen, naproxen, or  ketoprofen unless instructed by your health care professional. These medicines may hide a fever. Be careful brushing or flossing your teeth or using a toothpick because you may get an infection or bleed more easily. If you have any dental work done, tell your dentist you are receiving this medicine. What side effects may I notice from receiving this medicine? Side effects that you should report to your doctor or health care professional as soon as possible:  allergic reactions like skin rash, itching or hives, swelling of the face, lips, or tongue  breathing problems  cough  low blood counts - this medicine may decrease the number of white blood cells, red blood cells, and platelets. You may be at increased risk for infections and bleeding  nausea, vomiting  pain, redness, or irritation at site where injected  pain, tingling, numbness in the hands or feet  signs and symptoms of bleeding such as bloody or black, tarry stools; red or dark brown urine; spitting up blood or brown material that looks like coffee grounds; red spots on the skin; unusual bruising or bleeding from the eyes, gums, or nose  signs and symptoms of a dangerous change in heartbeat or heart rhythm like chest pain; dizziness; fast, irregular heartbeat; palpitations; feeling faint or lightheaded; falls  signs and symptoms of infection like fever; chills; cough; sore throat; pain or trouble passing urine  signs and symptoms of liver injury like dark yellow or brown urine; general ill feeling or flu-like symptoms; light-colored stools; loss of appetite; nausea; right upper belly pain; unusually weak or tired; yellowing of the eyes or skin  signs and symptoms of low red blood cells or anemia such as unusually weak or tired; feeling faint or lightheaded; falls  signs and symptoms of muscle injury like dark urine; trouble passing urine or change in the amount of urine; unusually weak or tired; muscle pain; back pain Side effects that  usually do not require medical attention (report to your doctor or health care professional if they continue or are bothersome):  changes in taste  diarrhea  gas  hair loss  loss of appetite  mouth sores This list may not describe all possible side effects. Call your doctor for medical advice about side effects. You may report side effects to FDA at 1-800-FDA-1088. Where should I keep my medicine? This drug is given in a hospital or clinic and will not be stored at home. NOTE: This sheet is a summary. It may not cover all possible information. If you have questions about this medicine, talk to your doctor, pharmacist, or health care provider.  2021 Elsevier/Gold Standard (2018-08-16 12:20:35)   Leucovorin injection What is this medicine? LEUCOVORIN (loo koe VOR in) is used to prevent or treat the harmful effects of some medicines. This medicine is used to treat anemia caused by a low amount of folic acid in the body. It is also used with 5-fluorouracil (5-FU) to treat colon cancer. This medicine may be used for other purposes; ask your health care provider or pharmacist if you have questions. What should I tell my health care provider before I take this medicine? They need to know if you have any of these conditions:  anemia from low levels of vitamin B-12 in the blood  an unusual or allergic reaction to leucovorin, folic acid, other medicines, foods, dyes, or preservatives  pregnant or trying to get pregnant  breast-feeding How should I use this medicine? This medicine is for injection into a muscle or into a vein. It is given by a health care professional in a hospital or clinic setting. Talk to your pediatrician regarding the use of this medicine in children. Special care may be needed. Overdosage: If you think you have taken too much of this medicine contact a poison control center or emergency room at once. NOTE: This medicine is only for you. Do not share this medicine  with others. What if I miss a dose? This does not apply. What may interact with this medicine?  capecitabine  fluorouracil  phenobarbital  phenytoin  primidone  trimethoprim-sulfamethoxazole This list may not describe all possible interactions. Give your health care provider a list of all the medicines, herbs, non-prescription drugs, or dietary supplements you use. Also tell them if you smoke, drink alcohol, or use illegal drugs. Some items may interact with your medicine. What should I watch for while using this medicine? Your condition will be monitored carefully while you are receiving this medicine. This medicine may increase the side effects of 5-fluorouracil, 5-FU. Tell your doctor or health care professional if you have diarrhea or mouth sores that do not get better or that get worse. What side effects may I notice from receiving this medicine? Side effects that you should report to your doctor or health care professional as soon as possible:  allergic reactions like skin rash, itching or hives, swelling of the   face, lips, or tongue  breathing problems  fever, infection  mouth sores  unusual bleeding or bruising  unusually weak or tired Side effects that usually do not require medical attention (report to your doctor or health care professional if they continue or are bothersome):  constipation or diarrhea  loss of appetite  nausea, vomiting This list may not describe all possible side effects. Call your doctor for medical advice about side effects. You may report side effects to FDA at 1-800-FDA-1088. Where should I keep my medicine? This drug is given in a hospital or clinic and will not be stored at home. NOTE: This sheet is a summary. It may not cover all possible information. If you have questions about this medicine, talk to your doctor, pharmacist, or health care provider.  2021 Elsevier/Gold Standard (2007-10-03 16:50:29)  Fluorouracil, 5-FU  injection What is this medicine? FLUOROURACIL, 5-FU (flure oh YOOR a sil) is a chemotherapy drug. It slows the growth of cancer cells. This medicine is used to treat many types of cancer like breast cancer, colon or rectal cancer, pancreatic cancer, and stomach cancer. This medicine may be used for other purposes; ask your health care provider or pharmacist if you have questions. COMMON BRAND NAME(S): Adrucil What should I tell my health care provider before I take this medicine? They need to know if you have any of these conditions:  blood disorders  dihydropyrimidine dehydrogenase (DPD) deficiency  infection (especially a virus infection such as chickenpox, cold sores, or herpes)  kidney disease  liver disease  malnourished, poor nutrition  recent or ongoing radiation therapy  an unusual or allergic reaction to fluorouracil, other chemotherapy, other medicines, foods, dyes, or preservatives  pregnant or trying to get pregnant  breast-feeding How should I use this medicine? This drug is given as an infusion or injection into a vein. It is administered in a hospital or clinic by a specially trained health care professional. Talk to your pediatrician regarding the use of this medicine in children. Special care may be needed. Overdosage: If you think you have taken too much of this medicine contact a poison control center or emergency room at once. NOTE: This medicine is only for you. Do not share this medicine with others. What if I miss a dose? It is important not to miss your dose. Call your doctor or health care professional if you are unable to keep an appointment. What may interact with this medicine? Do not take this medicine with any of the following medications:  live virus vaccines This medicine may also interact with the following medications:  medicines that treat or prevent blood clots like warfarin, enoxaparin, and dalteparin This list may not describe all  possible interactions. Give your health care provider a list of all the medicines, herbs, non-prescription drugs, or dietary supplements you use. Also tell them if you smoke, drink alcohol, or use illegal drugs. Some items may interact with your medicine. What should I watch for while using this medicine? Visit your doctor for checks on your progress. This drug may make you feel generally unwell. This is not uncommon, as chemotherapy can affect healthy cells as well as cancer cells. Report any side effects. Continue your course of treatment even though you feel ill unless your doctor tells you to stop. In some cases, you may be given additional medicines to help with side effects. Follow all directions for their use. Call your doctor or health care professional for advice if you get a fever, chills   or sore throat, or other symptoms of a cold or flu. Do not treat yourself. This drug decreases your body's ability to fight infections. Try to avoid being around people who are sick. This medicine may increase your risk to bruise or bleed. Call your doctor or health care professional if you notice any unusual bleeding. Be careful brushing and flossing your teeth or using a toothpick because you may get an infection or bleed more easily. If you have any dental work done, tell your dentist you are receiving this medicine. Avoid taking products that contain aspirin, acetaminophen, ibuprofen, naproxen, or ketoprofen unless instructed by your doctor. These medicines may hide a fever. Do not become pregnant while taking this medicine. Women should inform their doctor if they wish to become pregnant or think they might be pregnant. There is a potential for serious side effects to an unborn child. Talk to your health care professional or pharmacist for more information. Do not breast-feed an infant while taking this medicine. Men should inform their doctor if they wish to father a child. This medicine may lower sperm  counts. Do not treat diarrhea with over the counter products. Contact your doctor if you have diarrhea that lasts more than 2 days or if it is severe and watery. This medicine can make you more sensitive to the sun. Keep out of the sun. If you cannot avoid being in the sun, wear protective clothing and use sunscreen. Do not use sun lamps or tanning beds/booths. What side effects may I notice from receiving this medicine? Side effects that you should report to your doctor or health care professional as soon as possible:  allergic reactions like skin rash, itching or hives, swelling of the face, lips, or tongue  low blood counts - this medicine may decrease the number of white blood cells, red blood cells and platelets. You may be at increased risk for infections and bleeding.  signs of infection - fever or chills, cough, sore throat, pain or difficulty passing urine  signs of decreased platelets or bleeding - bruising, pinpoint red spots on the skin, black, tarry stools, blood in the urine  signs of decreased red blood cells - unusually weak or tired, fainting spells, lightheadedness  breathing problems  changes in vision  chest pain  mouth sores  nausea and vomiting  pain, swelling, redness at site where injected  pain, tingling, numbness in the hands or feet  redness, swelling, or sores on hands or feet  stomach pain  unusual bleeding Side effects that usually do not require medical attention (report to your doctor or health care professional if they continue or are bothersome):  changes in finger or toe nails  diarrhea  dry or itchy skin  hair loss  headache  loss of appetite  sensitivity of eyes to the light  stomach upset  unusually teary eyes This list may not describe all possible side effects. Call your doctor for medical advice about side effects. You may report side effects to FDA at 1-800-FDA-1088. Where should I keep my medicine? This drug is given  in a hospital or clinic and will not be stored at home. NOTE: This sheet is a summary. It may not cover all possible information. If you have questions about this medicine, talk to your doctor, pharmacist, or health care provider.  2021 Elsevier/Gold Standard (2019-02-27 15:00:03)   

## 2020-05-13 ENCOUNTER — Telehealth: Payer: Self-pay | Admitting: Oncology

## 2020-05-13 ENCOUNTER — Encounter: Payer: Self-pay | Admitting: Oncology

## 2020-05-13 NOTE — Progress Notes (Signed)
Called pt to introduce myself as her Arboriculturist.  Pt has 2 insurances so copay assistance shouldn't be needed.  I offered the [employer] and went over what it covers.  Pt declined the grant at this time so I will request for the registration staff give her my card in case she changes her mind and for any questions or concerns she may have in the future.

## 2020-05-13 NOTE — Progress Notes (Deleted)
I completed the applicationw/ [place] for [place] for 2022.  I faxed the completed application to [employer] for processing today.  I will notify herof the outcome once I receive it.

## 2020-05-13 NOTE — Telephone Encounter (Signed)
Scheduled appointments per 1/31 los. Spoke to patient who is aware of appointments dates and times.

## 2020-05-14 ENCOUNTER — Other Ambulatory Visit: Payer: Self-pay

## 2020-05-14 ENCOUNTER — Inpatient Hospital Stay: Payer: Medicare Other | Attending: Hematology and Oncology

## 2020-05-14 VITALS — BP 130/60 | HR 62 | Resp 18

## 2020-05-14 DIAGNOSIS — C50411 Malignant neoplasm of upper-outer quadrant of right female breast: Secondary | ICD-10-CM | POA: Diagnosis present

## 2020-05-14 DIAGNOSIS — Z7901 Long term (current) use of anticoagulants: Secondary | ICD-10-CM | POA: Diagnosis not present

## 2020-05-14 DIAGNOSIS — C180 Malignant neoplasm of cecum: Secondary | ICD-10-CM | POA: Diagnosis present

## 2020-05-14 DIAGNOSIS — Z923 Personal history of irradiation: Secondary | ICD-10-CM | POA: Diagnosis not present

## 2020-05-14 DIAGNOSIS — Z5111 Encounter for antineoplastic chemotherapy: Secondary | ICD-10-CM | POA: Diagnosis not present

## 2020-05-14 DIAGNOSIS — E039 Hypothyroidism, unspecified: Secondary | ICD-10-CM | POA: Insufficient documentation

## 2020-05-14 DIAGNOSIS — E119 Type 2 diabetes mellitus without complications: Secondary | ICD-10-CM | POA: Insufficient documentation

## 2020-05-14 DIAGNOSIS — Z79899 Other long term (current) drug therapy: Secondary | ICD-10-CM | POA: Diagnosis not present

## 2020-05-14 DIAGNOSIS — Z170 Estrogen receptor positive status [ER+]: Secondary | ICD-10-CM | POA: Insufficient documentation

## 2020-05-14 DIAGNOSIS — D509 Iron deficiency anemia, unspecified: Secondary | ICD-10-CM | POA: Insufficient documentation

## 2020-05-14 DIAGNOSIS — C182 Malignant neoplasm of ascending colon: Secondary | ICD-10-CM

## 2020-05-14 MED ORDER — HEPARIN SOD (PORK) LOCK FLUSH 100 UNIT/ML IV SOLN
500.0000 [IU] | Freq: Once | INTRAVENOUS | Status: AC | PRN
  Administered 2020-05-14: 500 [IU]
  Filled 2020-05-14: qty 5

## 2020-05-14 MED ORDER — SODIUM CHLORIDE 0.9% FLUSH
10.0000 mL | INTRAVENOUS | Status: DC | PRN
  Administered 2020-05-14: 10 mL
  Filled 2020-05-14: qty 10

## 2020-05-14 NOTE — Patient Instructions (Signed)

## 2020-05-15 ENCOUNTER — Ambulatory Visit: Payer: Medicare Other

## 2020-05-20 ENCOUNTER — Ambulatory Visit (INDEPENDENT_AMBULATORY_CARE_PROVIDER_SITE_OTHER): Payer: Medicare Other

## 2020-05-20 ENCOUNTER — Other Ambulatory Visit: Payer: Self-pay

## 2020-05-20 DIAGNOSIS — I480 Paroxysmal atrial fibrillation: Secondary | ICD-10-CM | POA: Diagnosis not present

## 2020-05-20 DIAGNOSIS — Z7901 Long term (current) use of anticoagulants: Secondary | ICD-10-CM | POA: Diagnosis not present

## 2020-05-20 DIAGNOSIS — Z5181 Encounter for therapeutic drug level monitoring: Secondary | ICD-10-CM | POA: Diagnosis not present

## 2020-05-20 LAB — POCT INR: INR: 2 (ref 2.0–3.0)

## 2020-05-20 NOTE — Patient Instructions (Signed)
Continue 10mg  daily except 5mg  on Mondays, Wednesdays and Saturdays.  Recheck INR in 7 weeks. [phone]

## 2020-05-25 ENCOUNTER — Other Ambulatory Visit: Payer: Self-pay | Admitting: Oncology

## 2020-05-26 ENCOUNTER — Inpatient Hospital Stay: Payer: Medicare Other

## 2020-05-26 ENCOUNTER — Other Ambulatory Visit: Payer: Self-pay

## 2020-05-26 ENCOUNTER — Encounter: Payer: Self-pay | Admitting: Nurse Practitioner

## 2020-05-26 ENCOUNTER — Other Ambulatory Visit: Payer: Medicare Other

## 2020-05-26 ENCOUNTER — Inpatient Hospital Stay (HOSPITAL_BASED_OUTPATIENT_CLINIC_OR_DEPARTMENT_OTHER): Payer: Medicare Other | Admitting: Nurse Practitioner

## 2020-05-26 VITALS — BP 135/58 | HR 66 | Temp 97.6°F | Resp 16 | Ht 70.0 in | Wt 157.4 lb

## 2020-05-26 DIAGNOSIS — I480 Paroxysmal atrial fibrillation: Secondary | ICD-10-CM | POA: Diagnosis not present

## 2020-05-26 DIAGNOSIS — Z5111 Encounter for antineoplastic chemotherapy: Secondary | ICD-10-CM | POA: Diagnosis not present

## 2020-05-26 DIAGNOSIS — C182 Malignant neoplasm of ascending colon: Secondary | ICD-10-CM | POA: Diagnosis not present

## 2020-05-26 DIAGNOSIS — C50411 Malignant neoplasm of upper-outer quadrant of right female breast: Secondary | ICD-10-CM

## 2020-05-26 DIAGNOSIS — Z170 Estrogen receptor positive status [ER+]: Secondary | ICD-10-CM

## 2020-05-26 LAB — CBC WITH DIFFERENTIAL (CANCER CENTER ONLY)
Abs Immature Granulocytes: 0.02 10*3/uL (ref 0.00–0.07)
Basophils Absolute: 0 10*3/uL (ref 0.0–0.1)
Basophils Relative: 1 %
Eosinophils Absolute: 0 10*3/uL (ref 0.0–0.5)
Eosinophils Relative: 1 %
HCT: 33.1 % — ABNORMAL LOW (ref 36.0–46.0)
Hemoglobin: 9.9 g/dL — ABNORMAL LOW (ref 12.0–15.0)
Immature Granulocytes: 0 %
Lymphocytes Relative: 16 %
Lymphs Abs: 0.9 10*3/uL (ref 0.7–4.0)
MCH: 23.4 pg — ABNORMAL LOW (ref 26.0–34.0)
MCHC: 29.9 g/dL — ABNORMAL LOW (ref 30.0–36.0)
MCV: 78.3 fL — ABNORMAL LOW (ref 80.0–100.0)
Monocytes Absolute: 0.5 10*3/uL (ref 0.1–1.0)
Monocytes Relative: 9 %
Neutro Abs: 4 10*3/uL (ref 1.7–7.7)
Neutrophils Relative %: 73 %
Platelet Count: 185 10*3/uL (ref 150–400)
RBC: 4.23 MIL/uL (ref 3.87–5.11)
RDW: 20.7 % — ABNORMAL HIGH (ref 11.5–15.5)
WBC Count: 5.4 10*3/uL (ref 4.0–10.5)
nRBC: 0 % (ref 0.0–0.2)

## 2020-05-26 LAB — CMP (CANCER CENTER ONLY)
ALT: 11 U/L (ref 0–44)
AST: 15 U/L (ref 15–41)
Albumin: 3.8 g/dL (ref 3.5–5.0)
Alkaline Phosphatase: 95 U/L (ref 38–126)
Anion gap: 6 (ref 5–15)
BUN: 12 mg/dL (ref 8–23)
CO2: 29 mmol/L (ref 22–32)
Calcium: 9.2 mg/dL (ref 8.9–10.3)
Chloride: 104 mmol/L (ref 98–111)
Creatinine: 0.63 mg/dL (ref 0.44–1.00)
GFR, Estimated: >60 mL/min (ref >60)
Glucose, Bld: 138 mg/dL — ABNORMAL HIGH (ref 70–99)
Potassium: 4.2 mmol/L (ref 3.5–5.1)
Sodium: 139 mmol/L (ref 135–145)
Total Bilirubin: <0.2 mg/dL — ABNORMAL LOW (ref 0.3–1.2)
Total Protein: 6.7 g/dL (ref 6.5–8.1)

## 2020-05-26 LAB — PROTIME-INR
INR: 2.5 — ABNORMAL HIGH (ref 0.8–1.2)
Prothrombin Time: 26.2 seconds — ABNORMAL HIGH (ref 11.4–15.2)

## 2020-05-26 MED ORDER — SODIUM CHLORIDE 0.9 % IV SOLN
2400.0000 mg/m2 | INTRAVENOUS | Status: DC
  Administered 2020-05-26: 4450 mg via INTRAVENOUS
  Filled 2020-05-26: qty 89

## 2020-05-26 MED ORDER — PALONOSETRON HCL INJECTION 0.25 MG/5ML
0.2500 mg | Freq: Once | INTRAVENOUS | Status: AC
Start: 2020-05-26 — End: 2020-05-26
  Administered 2020-05-26: 0.25 mg via INTRAVENOUS

## 2020-05-26 MED ORDER — LEUCOVORIN CALCIUM INJECTION 350 MG
400.0000 mg/m2 | Freq: Once | INTRAVENOUS | Status: AC
  Administered 2020-05-26: 740 mg via INTRAVENOUS
  Filled 2020-05-26: qty 37

## 2020-05-26 MED ORDER — PALONOSETRON HCL INJECTION 0.25 MG/5ML
INTRAVENOUS | Status: AC
  Filled 2020-05-26: qty 5

## 2020-05-26 MED ORDER — DEXTROSE 5 % IV SOLN
Freq: Once | INTRAVENOUS | Status: AC
  Filled 2020-05-26: qty 250

## 2020-05-26 MED ORDER — SODIUM CHLORIDE 0.9 % IV SOLN
10.0000 mg | Freq: Once | INTRAVENOUS | Status: AC
  Administered 2020-05-26: 10 mg via INTRAVENOUS
  Filled 2020-05-26: qty 10

## 2020-05-26 MED ORDER — FLUOROURACIL CHEMO INJECTION 2.5 GM/50ML
400.0000 mg/m2 | Freq: Once | INTRAVENOUS | Status: AC
  Administered 2020-05-26: 750 mg via INTRAVENOUS
  Filled 2020-05-26: qty 15

## 2020-05-26 MED ORDER — OXALIPLATIN CHEMO INJECTION 100 MG/20ML
82.0000 mg/m2 | Freq: Once | INTRAVENOUS | Status: AC
  Administered 2020-05-26: 150 mg via INTRAVENOUS
  Filled 2020-05-26: qty 20

## 2020-05-26 NOTE — Progress Notes (Signed)
  [name] OFFICE PROGRESS NOTE   Diagnosis: Colon cancer  INTERVAL HISTORY:   Sabrina [name] returns as scheduled.  She denies nausea/vomiting.  No mouth sores.  No diarrhea.  Cold sensitivity lasted for 5 days.  No persistent neuropathy symptoms.  Objective:  Vital signs in last 24 hours:  Blood pressure (!) 135/58, pulse 66, temperature 97.6 F (36.4 C), temperature source Tympanic, resp. rate 16, height $RemoveBe'5\' 10"'NNlaSoans$  (1.778 m), weight 157 lb 6.4 oz (71.4 kg), SpO2 98 %.    HEENT: No thrush or ulcers. Resp: Scattered bilateral inspiratory wheeze.  No respiratory distress. Cardio: Regular rate and rhythm. GI: Abdomen soft and nontender.  No hepatomegaly.  Healed surgical incisions. Vascular: No leg edema.  Left lower leg is slightly larger than the right lower leg.  Port-A-Cath without erythema.  Lab Results:  Lab Results  Component Value Date   WBC 5.4 05/26/2020   HGB 9.9 (L) 05/26/2020   HCT 33.1 (L) 05/26/2020   MCV 78.3 (L) 05/26/2020   PLT 185 05/26/2020   NEUTROABS 4.0 05/26/2020    Imaging:  No results found.  Medications: I have reviewed the patient's current medications.  Assessment/Plan: 1. 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 Moderately differentiated adenocarcinoma, 9/30 lymph nodes positive, lymphovascular invasion present including large vessel extramural invasion, loss of MLH1 and PMS2, MLH1 hyper methylation present ? Incomplete colonoscopy secondary to stricturing at the sigmoid colon ? Virtual colonoscopy 03/13/2020- mass centered at the medial wall of the cecum and terminal ileum, adjacent ileocolic mesenteric adenopathy, nonspecific 3 mm right lower lobe nodule ? PET 73/42/8768 hypermetabolic cecal/terminal ileum mass with adjacent enlarged and hypermetabolic ileocolic lymph nodes, previously noted segment 7 liver lesion-not hypermetabolic- benign etiology favored, dominant left lower lobe  pulmonary nodule not hypermetabolic, other tiny nodules not hypermetabolic and below PET sensitivity ? Cycle 1 FOLFOX 05/12/2020 ? Cycle 2 FOLFOX 05/26/2020 2. Iron deficiency anemia secondary to #1 3. Right breast cancer, stage Ia, T1b, N0, ER positive, PR positive, HER2 positive status post a right lumpectomy and adjuvant radiation/Taxol/Herceptin, anastrozole starting 01/25/2019 4. Diabetes 5. Atrial fibrillation-maintained on Coumadin 6. Hypothyroidism 7. Ongoing tobacco use 8. Family history of breast cancer-negative genetic testing   Disposition: Sabrina [name] appears stable.  She has completed 1 cycle of FOLFOX.  She tolerated well.  Plan to proceed with cycle 2 today as scheduled.  We reviewed the CBC and chemistry panel from today.  Labs adequate to proceed with treatment.  She will return for lab, follow-up, cycle 3 FOLFOX in 2 weeks.  She will contact the office in the interim with any problems.    Ned Card ANP/GNP-BC   05/26/2020  9:17 AM

## 2020-05-27 ENCOUNTER — Telehealth: Payer: Self-pay | Admitting: Nurse Practitioner

## 2020-05-27 NOTE — Telephone Encounter (Signed)
Scheduled appointments per 2/14 los. Called patient, no answer. Left message with appointments date and times.

## 2020-05-28 ENCOUNTER — Other Ambulatory Visit: Payer: Self-pay

## 2020-05-28 ENCOUNTER — Inpatient Hospital Stay: Payer: Medicare Other

## 2020-05-28 VITALS — BP 167/80 | HR 69 | Temp 98.2°F | Resp 15

## 2020-05-28 DIAGNOSIS — Z5111 Encounter for antineoplastic chemotherapy: Secondary | ICD-10-CM | POA: Diagnosis not present

## 2020-05-28 DIAGNOSIS — Z170 Estrogen receptor positive status [ER+]: Secondary | ICD-10-CM

## 2020-05-28 DIAGNOSIS — C182 Malignant neoplasm of ascending colon: Secondary | ICD-10-CM

## 2020-05-28 MED ORDER — SODIUM CHLORIDE 0.9% FLUSH
10.0000 mL | INTRAVENOUS | Status: DC | PRN
  Administered 2020-05-28: 10 mL
  Filled 2020-05-28: qty 10

## 2020-05-28 MED ORDER — HEPARIN SOD (PORK) LOCK FLUSH 100 UNIT/ML IV SOLN
500.0000 [IU] | Freq: Once | INTRAVENOUS | Status: AC | PRN
  Administered 2020-05-28: 500 [IU]
  Filled 2020-05-28: qty 5

## 2020-05-29 ENCOUNTER — Telehealth: Payer: Self-pay | Admitting: Cardiology

## 2020-05-29 MED ORDER — DIGOXIN 125 MCG PO TABS: 0.1250 mg | ORAL_TABLET | Freq: Every day | ORAL | 1 refills | Status: DC

## 2020-05-29 NOTE — Telephone Encounter (Signed)
[facility]  

## 2020-05-29 NOTE — Telephone Encounter (Signed)
Medication filled without note.

## 2020-05-29 NOTE — Telephone Encounter (Signed)
    Pt is calling back, she said her digoxin needs to be send to [facility] not [facility]. She would like to cancel the refill at [facility] and [facility]

## 2020-05-29 NOTE — Telephone Encounter (Signed)
Pt c/o medication issue:  1. Name of Medication: digoxin (LANOXIN) 0.125 MG tablet  2. How are you currently taking this medication (dosage and times per day)? One tablet daily as directed   3. Are you having a reaction (difficulty breathing--STAT)? no  4. What is your medication issue? Patient said she normally gets a 90 day supply from [facility]  Patient states her medication bottle says "needs appt for refills, 1st warning" but the patient had an appt with Dr. Raliegh Ip 03/21/20.  Patient would like to get this changed so she gets a 90 day supply and not 30 days

## 2020-05-29 NOTE — Addendum Note (Signed)
Addended by: Senaida Ores on: 05/29/2020 04:14 PM   Modules accepted: Orders

## 2020-06-04 ENCOUNTER — Other Ambulatory Visit: Payer: Self-pay | Admitting: Cardiology

## 2020-06-08 ENCOUNTER — Other Ambulatory Visit: Payer: Self-pay | Admitting: Oncology

## 2020-06-09 ENCOUNTER — Inpatient Hospital Stay (HOSPITAL_BASED_OUTPATIENT_CLINIC_OR_DEPARTMENT_OTHER): Payer: Medicare Other | Admitting: Oncology

## 2020-06-09 ENCOUNTER — Other Ambulatory Visit: Payer: Self-pay

## 2020-06-09 ENCOUNTER — Inpatient Hospital Stay: Payer: Medicare Other

## 2020-06-09 ENCOUNTER — Telehealth: Payer: Self-pay

## 2020-06-09 VITALS — BP 144/66 | HR 64 | Temp 97.8°F | Resp 18 | Ht 70.0 in | Wt 158.5 lb

## 2020-06-09 DIAGNOSIS — Z170 Estrogen receptor positive status [ER+]: Secondary | ICD-10-CM

## 2020-06-09 DIAGNOSIS — Z5111 Encounter for antineoplastic chemotherapy: Secondary | ICD-10-CM | POA: Diagnosis not present

## 2020-06-09 DIAGNOSIS — I480 Paroxysmal atrial fibrillation: Secondary | ICD-10-CM

## 2020-06-09 DIAGNOSIS — C182 Malignant neoplasm of ascending colon: Secondary | ICD-10-CM | POA: Diagnosis not present

## 2020-06-09 DIAGNOSIS — C50411 Malignant neoplasm of upper-outer quadrant of right female breast: Secondary | ICD-10-CM

## 2020-06-09 LAB — CBC WITH DIFFERENTIAL (CANCER CENTER ONLY)
Abs Immature Granulocytes: 0.01 10*3/uL (ref 0.00–0.07)
Basophils Absolute: 0 10*3/uL (ref 0.0–0.1)
Basophils Relative: 1 %
Eosinophils Absolute: 0 10*3/uL (ref 0.0–0.5)
Eosinophils Relative: 1 %
HCT: 35.6 % — ABNORMAL LOW (ref 36.0–46.0)
Hemoglobin: 10.3 g/dL — ABNORMAL LOW (ref 12.0–15.0)
Immature Granulocytes: 0 %
Lymphocytes Relative: 15 %
Lymphs Abs: 0.7 10*3/uL (ref 0.7–4.0)
MCH: 22.9 pg — ABNORMAL LOW (ref 26.0–34.0)
MCHC: 28.9 g/dL — ABNORMAL LOW (ref 30.0–36.0)
MCV: 79.3 fL — ABNORMAL LOW (ref 80.0–100.0)
Monocytes Absolute: 0.5 10*3/uL (ref 0.1–1.0)
Monocytes Relative: 10 %
Neutro Abs: 3.6 10*3/uL (ref 1.7–7.7)
Neutrophils Relative %: 73 %
Platelet Count: 153 10*3/uL (ref 150–400)
RBC: 4.49 MIL/uL (ref 3.87–5.11)
RDW: 21.5 % — ABNORMAL HIGH (ref 11.5–15.5)
WBC Count: 4.9 10*3/uL (ref 4.0–10.5)
nRBC: 0 % (ref 0.0–0.2)

## 2020-06-09 LAB — CMP (CANCER CENTER ONLY)
ALT: 7 U/L (ref 0–44)
AST: 15 U/L (ref 15–41)
Albumin: 3.8 g/dL (ref 3.5–5.0)
Alkaline Phosphatase: 94 U/L (ref 38–126)
Anion gap: 7 (ref 5–15)
BUN: 11 mg/dL (ref 8–23)
CO2: 28 mmol/L (ref 22–32)
Calcium: 9.3 mg/dL (ref 8.9–10.3)
Chloride: 104 mmol/L (ref 98–111)
Creatinine: 0.66 mg/dL (ref 0.44–1.00)
GFR, Estimated: >60 mL/min (ref >60)
Glucose, Bld: 157 mg/dL — ABNORMAL HIGH (ref 70–99)
Potassium: 3.8 mmol/L (ref 3.5–5.1)
Sodium: 139 mmol/L (ref 135–145)
Total Bilirubin: <0.2 mg/dL — ABNORMAL LOW (ref 0.3–1.2)
Total Protein: 6.9 g/dL (ref 6.5–8.1)

## 2020-06-09 LAB — PROTIME-INR
INR: 3.4 — ABNORMAL HIGH (ref 0.8–1.2)
Prothrombin Time: 33.6 seconds — ABNORMAL HIGH (ref 11.4–15.2)

## 2020-06-09 MED ORDER — SODIUM CHLORIDE 0.9 % IV SOLN
10.0000 mg | Freq: Once | INTRAVENOUS | Status: AC
  Administered 2020-06-09: 10 mg via INTRAVENOUS
  Filled 2020-06-09: qty 10

## 2020-06-09 MED ORDER — SODIUM CHLORIDE 0.9% FLUSH
10.0000 mL | INTRAVENOUS | Status: DC | PRN
  Filled 2020-06-09: qty 10

## 2020-06-09 MED ORDER — PALONOSETRON HCL INJECTION 0.25 MG/5ML
INTRAVENOUS | Status: AC
  Filled 2020-06-09: qty 5

## 2020-06-09 MED ORDER — FLUOROURACIL CHEMO INJECTION 2.5 GM/50ML
400.0000 mg/m2 | Freq: Once | INTRAVENOUS | Status: AC
  Administered 2020-06-09: 750 mg via INTRAVENOUS
  Filled 2020-06-09: qty 15

## 2020-06-09 MED ORDER — HEPARIN SOD (PORK) LOCK FLUSH 100 UNIT/ML IV SOLN
500.0000 [IU] | Freq: Once | INTRAVENOUS | Status: DC | PRN
  Filled 2020-06-09: qty 5

## 2020-06-09 MED ORDER — DEXTROSE 5 % IV SOLN
Freq: Once | INTRAVENOUS | Status: AC
Start: 2020-06-09 — End: 2020-06-09
  Filled 2020-06-09: qty 250

## 2020-06-09 MED ORDER — SODIUM CHLORIDE 0.9 % IV SOLN
2400.0000 mg/m2 | INTRAVENOUS | Status: DC
  Administered 2020-06-09: 4450 mg via INTRAVENOUS
  Filled 2020-06-09: qty 89

## 2020-06-09 MED ORDER — OXALIPLATIN CHEMO INJECTION 100 MG/20ML
82.0000 mg/m2 | Freq: Once | INTRAVENOUS | Status: AC
  Administered 2020-06-09: 150 mg via INTRAVENOUS
  Filled 2020-06-09: qty 30

## 2020-06-09 MED ORDER — LEUCOVORIN CALCIUM INJECTION 350 MG
400.0000 mg/m2 | Freq: Once | INTRAVENOUS | Status: AC
  Administered 2020-06-09: 740 mg via INTRAVENOUS
  Filled 2020-06-09: qty 37

## 2020-06-09 MED ORDER — PALONOSETRON HCL INJECTION 0.25 MG/5ML
0.2500 mg | Freq: Once | INTRAVENOUS | Status: AC
  Administered 2020-06-09: 0.25 mg via INTRAVENOUS

## 2020-06-09 NOTE — Progress Notes (Signed)
  [place] OFFICE PROGRESS NOTE   Diagnosis: Colon cancer  INTERVAL HISTORY:   Sabrina [name] completed another cycle of FOLFOX on 05/26/2020.  No nausea/vomiting, mouth sores, or diarrhea.  She had cold sensitivity lasting for days following chemotherapy.  No neuropathy symptoms at present.  Objective:  Vital signs in last 24 hours:  Blood pressure (!) 144/66, pulse 64, temperature 97.8 F (36.6 C), temperature source Tympanic, resp. rate 18, height _0  (1.778 m), weight 158 lb 8 oz (71.9 kg), SpO2 97 %.    HEENT: No thrush or ulcers Resp: Distant breath sounds, scattered inspiratory/expiratory rhonchi and wheeze, no respiratory distress Cardio: Regular rate and rhythm GI: No hepatosplenomegaly, nontender Vascular: No leg edema  Skin: Palms without erythema  Portacath/PICC-without erythema  Lab Results:  Lab Results  Component Value Date   WBC 4.9 06/09/2020   HGB 10.3 (L) 06/09/2020   HCT 35.6 (L) 06/09/2020   MCV 79.3 (L) 06/09/2020   PLT 153 06/09/2020   NEUTROABS 3.6 06/09/2020    CMP  Lab Results  Component Value Date   NA 139 05/26/2020   K 4.2 05/26/2020   CL 104 05/26/2020   CO2 29 05/26/2020   GLUCOSE 138 (H) 05/26/2020   BUN 12 05/26/2020   CREATININE 0.63 05/26/2020   CALCIUM 9.2 05/26/2020   PROT 6.7 05/26/2020   ALBUMIN 3.8 05/26/2020   AST 15 05/26/2020   ALT 11 05/26/2020   ALKPHOS 95 05/26/2020   BILITOT <0.2 (L) 05/26/2020   GFRNONAA >60 05/26/2020   GFRAA >60 08/17/2019    Lab Results  Component Value Date   CEA1 2.37 05/05/2020    Lab Results  Component Value Date   INR 2.5 (H) 05/26/2020    Medications: I have reviewed the patient's current medications.   Assessment/Plan: 1. 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 Moderately differentiated adenocarcinoma, 9/30 lymph nodes positive, lymphovascular invasion present including large vessel extramural invasion, loss of  MLH1 and PMS2, MLH1 hyper methylation present ? Incomplete colonoscopy secondary to stricturing at the sigmoid colon ? Virtual colonoscopy 03/13/2020- mass centered at the medial wall of the cecum and terminal ileum, adjacent ileocolic mesenteric adenopathy, nonspecific 3 mm right lower lobe nodule ? PET 25/36/6440 hypermetabolic cecal/terminal ileum mass with adjacent enlarged and hypermetabolic ileocolic lymph nodes, previously noted segment 7 liver lesion-not hypermetabolic- benign etiology favored, dominant left lower lobe pulmonary nodule not hypermetabolic, other tiny nodules not hypermetabolic and below PET sensitivity ? Cycle 1 FOLFOX 05/12/2020 ? Cycle 2 FOLFOX 05/26/2020 ? Cycle 3 FOLFOX 06/09/2020 2. Iron deficiency anemia secondary to #1 3. Right breast cancer, stage Ia, T1b, N0, ER positive, PR positive, HER2 positive status post a right lumpectomy and adjuvant radiation/Taxol/Herceptin, anastrozole starting 01/25/2019 4. Diabetes 5. Atrial fibrillation-maintained on Coumadin 6. Hypothyroidism 7. Ongoing tobacco use 8. Family history of breast cancer-negative genetic testing     Disposition: Sabrina [name] is tolerating the FOLFOX well.  She will complete cycle 3 today.  We will follow up on the PT/INR from today.  She will return for an office visit in the next cycle of chemotherapy in 2 weeks.  Betsy Coder, MD  06/09/2020  8:35 AM

## 2020-06-09 NOTE — Patient Instructions (Signed)
[facility] Discharge Instructions for Patients Receiving Chemotherapy  Today you received the following chemotherapy agents Oxaliplatin, leucovorin, and 5FU  To help prevent nausea and vomiting after your treatment, we encourage you to take your nausea medication as directed If you develop nausea and vomiting that is not controlled by your nausea medication, call the clinic.   BELOW ARE SYMPTOMS THAT SHOULD BE REPORTED IMMEDIATELY:  *FEVER GREATER THAN 100.5 F  *CHILLS WITH OR WITHOUT FEVER  NAUSEA AND VOMITING THAT IS NOT CONTROLLED WITH YOUR NAUSEA MEDICATION  *UNUSUAL SHORTNESS OF BREATH  *UNUSUAL BRUISING OR BLEEDING  TENDERNESS IN MOUTH AND THROAT WITH OR WITHOUT PRESENCE OF ULCERS  *URINARY PROBLEMS  *BOWEL PROBLEMS  UNUSUAL RASH Items with * indicate a potential emergency and should be followed up as soon as possible.  Feel free to call the clinic should you have any questions or concerns. The clinic phone number is [phone].  Please show the [facility] at check-in to the Emergency Department and triage nurse.   Oxaliplatin Injection What is this medicine? OXALIPLATIN (ox AL i PLA tin) is a chemotherapy drug. It targets fast dividing cells, like cancer cells, and causes these cells to die. This medicine is used to treat cancers of the colon and rectum, and many other cancers. This medicine may be used for other purposes; ask your health care provider or pharmacist if you have questions. COMMON BRAND NAME(S): Eloxatin What should I tell my health care provider before I take this medicine? They need to know if you have any of these conditions:  heart disease  history of irregular heartbeat  liver disease  low blood counts, like white cells, platelets, or red blood cells  lung or breathing disease, like asthma  take medicines that treat or prevent blood clots  tingling of the fingers or toes, or other nerve disorder  an unusual or  allergic reaction to oxaliplatin, other chemotherapy, other medicines, foods, dyes, or preservatives  pregnant or trying to get pregnant  breast-feeding How should I use this medicine? This drug is given as an infusion into a vein. It is administered in a hospital or clinic by a specially trained health care professional. Talk to your pediatrician regarding the use of this medicine in children. Special care may be needed. Overdosage: If you think you have taken too much of this medicine contact a poison control center or emergency room at once. NOTE: This medicine is only for you. Do not share this medicine with others. What if I miss a dose? It is important not to miss a dose. Call your doctor or health care professional if you are unable to keep an appointment. What may interact with this medicine? Do not take this medicine with any of the following medications:  cisapride  dronedarone  pimozide  thioridazine This medicine may also interact with the following medications:  aspirin and aspirin-like medicines  certain medicines that treat or prevent blood clots like warfarin, apixaban, dabigatran, and rivaroxaban  cisplatin  cyclosporine  diuretics  medicines for infection like acyclovir, adefovir, amphotericin B, bacitracin, cidofovir, foscarnet, ganciclovir, gentamicin, pentamidine, vancomycin  NSAIDs, medicines for pain and inflammation, like ibuprofen or naproxen  other medicines that prolong the QT interval (an abnormal heart rhythm)  pamidronate  zoledronic acid This list may not describe all possible interactions. Give your health care provider a list of all the medicines, herbs, non-prescription drugs, or dietary supplements you use. Also tell them if you smoke, drink alcohol,  or use illegal drugs. Some items may interact with your medicine. What should I watch for while using this medicine? Your condition will be monitored carefully while you are receiving this  medicine. You may need blood work done while you are taking this medicine. This medicine may make you feel generally unwell. This is not uncommon as chemotherapy can affect healthy cells as well as cancer cells. Report any side effects. Continue your course of treatment even though you feel ill unless your healthcare professional tells you to stop. This medicine can make you more sensitive to cold. Do not drink cold drinks or use ice. Cover exposed skin before coming in contact with cold temperatures or cold objects. When out in cold weather wear warm clothing and cover your mouth and nose to warm the air that goes into your lungs. Tell your doctor if you get sensitive to the cold. Do not become pregnant while taking this medicine or for 9 months after stopping it. Women should inform their health care professional if they wish to become pregnant or think they might be pregnant. Men should not father a child while taking this medicine and for 6 months after stopping it. There is potential for serious side effects to an unborn child. Talk to your health care professional for more information. Do not breast-feed a child while taking this medicine or for 3 months after stopping it. This medicine has caused ovarian failure in some women. This medicine may make it more difficult to get pregnant. Talk to your health care professional if you are concerned about your fertility. This medicine has caused decreased sperm counts in some men. This may make it more difficult to father a child. Talk to your health care professional if you are concerned about your fertility. This medicine may increase your risk of getting an infection. Call your health care professional for advice if you get a fever, chills, or sore throat, or other symptoms of a cold or flu. Do not treat yourself. Try to avoid being around people who are sick. Avoid taking medicines that contain aspirin, acetaminophen, ibuprofen, naproxen, or ketoprofen  unless instructed by your health care professional. These medicines may hide a fever. Be careful brushing or flossing your teeth or using a toothpick because you may get an infection or bleed more easily. If you have any dental work done, tell your dentist you are receiving this medicine. What side effects may I notice from receiving this medicine? Side effects that you should report to your doctor or health care professional as soon as possible:  allergic reactions like skin rash, itching or hives, swelling of the face, lips, or tongue  breathing problems  cough  low blood counts - this medicine may decrease the number of white blood cells, red blood cells, and platelets. You may be at increased risk for infections and bleeding  nausea, vomiting  pain, redness, or irritation at site where injected  pain, tingling, numbness in the hands or feet  signs and symptoms of bleeding such as bloody or black, tarry stools; red or dark brown urine; spitting up blood or brown material that looks like coffee grounds; red spots on the skin; unusual bruising or bleeding from the eyes, gums, or nose  signs and symptoms of a dangerous change in heartbeat or heart rhythm like chest pain; dizziness; fast, irregular heartbeat; palpitations; feeling faint or lightheaded; falls  signs and symptoms of infection like fever; chills; cough; sore throat; pain or trouble passing urine    signs and symptoms of liver injury like dark yellow or brown urine; general ill feeling or flu-like symptoms; light-colored stools; loss of appetite; nausea; right upper belly pain; unusually weak or tired; yellowing of the eyes or skin  signs and symptoms of low red blood cells or anemia such as unusually weak or tired; feeling faint or lightheaded; falls  signs and symptoms of muscle injury like dark urine; trouble passing urine or change in the amount of urine; unusually weak or tired; muscle pain; back pain Side effects that  usually do not require medical attention (report to your doctor or health care professional if they continue or are bothersome):  changes in taste  diarrhea  gas  hair loss  loss of appetite  mouth sores This list may not describe all possible side effects. Call your doctor for medical advice about side effects. You may report side effects to FDA at 1-800-FDA-1088. Where should I keep my medicine? This drug is given in a hospital or clinic and will not be stored at home. NOTE: This sheet is a summary. It may not cover all possible information. If you have questions about this medicine, talk to your doctor, pharmacist, or health care provider.  2021 Elsevier/Gold Standard (2018-08-16 12:20:35)   Leucovorin injection What is this medicine? LEUCOVORIN (loo koe VOR in) is used to prevent or treat the harmful effects of some medicines. This medicine is used to treat anemia caused by a low amount of folic acid in the body. It is also used with 5-fluorouracil (5-FU) to treat colon cancer. This medicine may be used for other purposes; ask your health care provider or pharmacist if you have questions. What should I tell my health care provider before I take this medicine? They need to know if you have any of these conditions:  anemia from low levels of vitamin B-12 in the blood  an unusual or allergic reaction to leucovorin, folic acid, other medicines, foods, dyes, or preservatives  pregnant or trying to get pregnant  breast-feeding How should I use this medicine? This medicine is for injection into a muscle or into a vein. It is given by a health care professional in a hospital or clinic setting. Talk to your pediatrician regarding the use of this medicine in children. Special care may be needed. Overdosage: If you think you have taken too much of this medicine contact a poison control center or emergency room at once. NOTE: This medicine is only for you. Do not share this medicine  with others. What if I miss a dose? This does not apply. What may interact with this medicine?  capecitabine  fluorouracil  phenobarbital  phenytoin  primidone  trimethoprim-sulfamethoxazole This list may not describe all possible interactions. Give your health care provider a list of all the medicines, herbs, non-prescription drugs, or dietary supplements you use. Also tell them if you smoke, drink alcohol, or use illegal drugs. Some items may interact with your medicine. What should I watch for while using this medicine? Your condition will be monitored carefully while you are receiving this medicine. This medicine may increase the side effects of 5-fluorouracil, 5-FU. Tell your doctor or health care professional if you have diarrhea or mouth sores that do not get better or that get worse. What side effects may I notice from receiving this medicine? Side effects that you should report to your doctor or health care professional as soon as possible:  allergic reactions like skin rash, itching or hives, swelling of the  face, lips, or tongue  breathing problems  fever, infection  mouth sores  unusual bleeding or bruising  unusually weak or tired Side effects that usually do not require medical attention (report to your doctor or health care professional if they continue or are bothersome):  constipation or diarrhea  loss of appetite  nausea, vomiting This list may not describe all possible side effects. Call your doctor for medical advice about side effects. You may report side effects to FDA at 1-800-FDA-1088. Where should I keep my medicine? This drug is given in a hospital or clinic and will not be stored at home. NOTE: This sheet is a summary. It may not cover all possible information. If you have questions about this medicine, talk to your doctor, pharmacist, or health care provider.  2021 Elsevier/Gold Standard (2007-10-03 16:50:29)  Fluorouracil, 5-FU  injection What is this medicine? FLUOROURACIL, 5-FU (flure oh YOOR a sil) is a chemotherapy drug. It slows the growth of cancer cells. This medicine is used to treat many types of cancer like breast cancer, colon or rectal cancer, pancreatic cancer, and stomach cancer. This medicine may be used for other purposes; ask your health care provider or pharmacist if you have questions. COMMON BRAND NAME(S): Adrucil What should I tell my health care provider before I take this medicine? They need to know if you have any of these conditions:  blood disorders  dihydropyrimidine dehydrogenase (DPD) deficiency  infection (especially a virus infection such as chickenpox, cold sores, or herpes)  kidney disease  liver disease  malnourished, poor nutrition  recent or ongoing radiation therapy  an unusual or allergic reaction to fluorouracil, other chemotherapy, other medicines, foods, dyes, or preservatives  pregnant or trying to get pregnant  breast-feeding How should I use this medicine? This drug is given as an infusion or injection into a vein. It is administered in a hospital or clinic by a specially trained health care professional. Talk to your pediatrician regarding the use of this medicine in children. Special care may be needed. Overdosage: If you think you have taken too much of this medicine contact a poison control center or emergency room at once. NOTE: This medicine is only for you. Do not share this medicine with others. What if I miss a dose? It is important not to miss your dose. Call your doctor or health care professional if you are unable to keep an appointment. What may interact with this medicine? Do not take this medicine with any of the following medications:  live virus vaccines This medicine may also interact with the following medications:  medicines that treat or prevent blood clots like warfarin, enoxaparin, and dalteparin This list may not describe all  possible interactions. Give your health care provider a list of all the medicines, herbs, non-prescription drugs, or dietary supplements you use. Also tell them if you smoke, drink alcohol, or use illegal drugs. Some items may interact with your medicine. What should I watch for while using this medicine? Visit your doctor for checks on your progress. This drug may make you feel generally unwell. This is not uncommon, as chemotherapy can affect healthy cells as well as cancer cells. Report any side effects. Continue your course of treatment even though you feel ill unless your doctor tells you to stop. In some cases, you may be given additional medicines to help with side effects. Follow all directions for their use. Call your doctor or health care professional for advice if you get a fever, chills  or sore throat, or other symptoms of a cold or flu. Do not treat yourself. This drug decreases your body's ability to fight infections. Try to avoid being around people who are sick. This medicine may increase your risk to bruise or bleed. Call your doctor or health care professional if you notice any unusual bleeding. Be careful brushing and flossing your teeth or using a toothpick because you may get an infection or bleed more easily. If you have any dental work done, tell your dentist you are receiving this medicine. Avoid taking products that contain aspirin, acetaminophen, ibuprofen, naproxen, or ketoprofen unless instructed by your doctor. These medicines may hide a fever. Do not become pregnant while taking this medicine. Women should inform their doctor if they wish to become pregnant or think they might be pregnant. There is a potential for serious side effects to an unborn child. Talk to your health care professional or pharmacist for more information. Do not breast-feed an infant while taking this medicine. Men should inform their doctor if they wish to father a child. This medicine may lower sperm  counts. Do not treat diarrhea with over the counter products. Contact your doctor if you have diarrhea that lasts more than 2 days or if it is severe and watery. This medicine can make you more sensitive to the sun. Keep out of the sun. If you cannot avoid being in the sun, wear protective clothing and use sunscreen. Do not use sun lamps or tanning beds/booths. What side effects may I notice from receiving this medicine? Side effects that you should report to your doctor or health care professional as soon as possible:  allergic reactions like skin rash, itching or hives, swelling of the face, lips, or tongue  low blood counts - this medicine may decrease the number of white blood cells, red blood cells and platelets. You may be at increased risk for infections and bleeding.  signs of infection - fever or chills, cough, sore throat, pain or difficulty passing urine  signs of decreased platelets or bleeding - bruising, pinpoint red spots on the skin, black, tarry stools, blood in the urine  signs of decreased red blood cells - unusually weak or tired, fainting spells, lightheadedness  breathing problems  changes in vision  chest pain  mouth sores  nausea and vomiting  pain, swelling, redness at site where injected  pain, tingling, numbness in the hands or feet  redness, swelling, or sores on hands or feet  stomach pain  unusual bleeding Side effects that usually do not require medical attention (report to your doctor or health care professional if they continue or are bothersome):  changes in finger or toe nails  diarrhea  dry or itchy skin  hair loss  headache  loss of appetite  sensitivity of eyes to the light  stomach upset  unusually teary eyes This list may not describe all possible side effects. Call your doctor for medical advice about side effects. You may report side effects to FDA at 1-800-FDA-1088. Where should I keep my medicine? This drug is given  in a hospital or clinic and will not be stored at home. NOTE: This sheet is a summary. It may not cover all possible information. If you have questions about this medicine, talk to your doctor, pharmacist, or health care provider.  2021 Elsevier/Gold Standard (2019-02-27 15:00:03)  [facility] Discharge Instructions for Patients Receiving Chemotherapy  Today you received the following chemotherapy agents Oxaliplatin, leucovorin, and 5FU  To help prevent nausea and vomiting after your treatment, we encourage you to take your nausea medication as directed If you develop nausea and vomiting that is not controlled by your nausea medication, call the clinic.   BELOW ARE SYMPTOMS THAT SHOULD BE REPORTED IMMEDIATELY:  *FEVER GREATER THAN 100.5 F  *CHILLS WITH OR WITHOUT FEVER  NAUSEA AND VOMITING THAT IS NOT CONTROLLED WITH YOUR NAUSEA MEDICATION  *UNUSUAL SHORTNESS OF BREATH  *UNUSUAL BRUISING OR BLEEDING  TENDERNESS IN MOUTH AND THROAT WITH OR WITHOUT PRESENCE OF ULCERS  *URINARY PROBLEMS  *BOWEL PROBLEMS  UNUSUAL RASH Items with * indicate a potential emergency and should be followed up as soon as possible.  Feel free to call the clinic should you have any questions or concerns. The clinic phone number is [phone].  Please show the [facility] at check-in to the Emergency Department and triage nurse.   Oxaliplatin Injection What is this medicine? OXALIPLATIN (ox AL i PLA tin) is a chemotherapy drug. It targets fast dividing cells, like cancer cells, and causes these cells to die. This medicine is used to treat cancers of the colon and rectum, and many other cancers. This medicine may be used for other purposes; ask your health care provider or pharmacist if you have questions. COMMON BRAND NAME(S): Eloxatin What should I tell my health care provider before I take this medicine? They need to know if you have any of these conditions:  heart  disease  history of irregular heartbeat  liver disease  low blood counts, like white cells, platelets, or red blood cells  lung or breathing disease, like asthma  take medicines that treat or prevent blood clots  tingling of the fingers or toes, or other nerve disorder  an unusual or allergic reaction to oxaliplatin, other chemotherapy, other medicines, foods, dyes, or preservatives  pregnant or trying to get pregnant  breast-feeding How should I use this medicine? This drug is given as an infusion into a vein. It is administered in a hospital or clinic by a specially trained health care professional. Talk to your pediatrician regarding the use of this medicine in children. Special care may be needed. Overdosage: If you think you have taken too much of this medicine contact a poison control center or emergency room at once. NOTE: This medicine is only for you. Do not share this medicine with others. What if I miss a dose? It is important not to miss a dose. Call your doctor or health care professional if you are unable to keep an appointment. What may interact with this medicine? Do not take this medicine with any of the following medications:  cisapride  dronedarone  pimozide  thioridazine This medicine may also interact with the following medications:  aspirin and aspirin-like medicines  certain medicines that treat or prevent blood clots like warfarin, apixaban, dabigatran, and rivaroxaban  cisplatin  cyclosporine  diuretics  medicines for infection like acyclovir, adefovir, amphotericin B, bacitracin, cidofovir, foscarnet, ganciclovir, gentamicin, pentamidine, vancomycin  NSAIDs, medicines for pain and inflammation, like ibuprofen or naproxen  other medicines that prolong the QT interval (an abnormal heart rhythm)  pamidronate  zoledronic acid This list may not describe all possible interactions. Give your health care provider a list of all the medicines,  herbs, non-prescription drugs, or dietary supplements you use. Also tell them if you smoke, drink alcohol, or use illegal drugs. Some items may interact with your medicine. What should I watch for while using this medicine? Your condition  will be monitored carefully while you are receiving this medicine. You may need blood work done while you are taking this medicine. This medicine may make you feel generally unwell. This is not uncommon as chemotherapy can affect healthy cells as well as cancer cells. Report any side effects. Continue your course of treatment even though you feel ill unless your healthcare professional tells you to stop. This medicine can make you more sensitive to cold. Do not drink cold drinks or use ice. Cover exposed skin before coming in contact with cold temperatures or cold objects. When out in cold weather wear warm clothing and cover your mouth and nose to warm the air that goes into your lungs. Tell your doctor if you get sensitive to the cold. Do not become pregnant while taking this medicine or for 9 months after stopping it. Women should inform their health care professional if they wish to become pregnant or think they might be pregnant. Men should not father a child while taking this medicine and for 6 months after stopping it. There is potential for serious side effects to an unborn child. Talk to your health care professional for more information. Do not breast-feed a child while taking this medicine or for 3 months after stopping it. This medicine has caused ovarian failure in some women. This medicine may make it more difficult to get pregnant. Talk to your health care professional if you are concerned about your fertility. This medicine has caused decreased sperm counts in some men. This may make it more difficult to father a child. Talk to your health care professional if you are concerned about your fertility. This medicine may increase your risk of getting an  infection. Call your health care professional for advice if you get a fever, chills, or sore throat, or other symptoms of a cold or flu. Do not treat yourself. Try to avoid being around people who are sick. Avoid taking medicines that contain aspirin, acetaminophen, ibuprofen, naproxen, or ketoprofen unless instructed by your health care professional. These medicines may hide a fever. Be careful brushing or flossing your teeth or using a toothpick because you may get an infection or bleed more easily. If you have any dental work done, tell your dentist you are receiving this medicine. What side effects may I notice from receiving this medicine? Side effects that you should report to your doctor or health care professional as soon as possible:  allergic reactions like skin rash, itching or hives, swelling of the face, lips, or tongue  breathing problems  cough  low blood counts - this medicine may decrease the number of white blood cells, red blood cells, and platelets. You may be at increased risk for infections and bleeding  nausea, vomiting  pain, redness, or irritation at site where injected  pain, tingling, numbness in the hands or feet  signs and symptoms of bleeding such as bloody or black, tarry stools; red or dark brown urine; spitting up blood or brown material that looks like coffee grounds; red spots on the skin; unusual bruising or bleeding from the eyes, gums, or nose  signs and symptoms of a dangerous change in heartbeat or heart rhythm like chest pain; dizziness; fast, irregular heartbeat; palpitations; feeling faint or lightheaded; falls  signs and symptoms of infection like fever; chills; cough; sore throat; pain or trouble passing urine  signs and symptoms of liver injury like dark yellow or brown urine; general ill feeling or flu-like symptoms; light-colored stools; loss of appetite;  nausea; right upper belly pain; unusually weak or tired; yellowing of the eyes or  skin  signs and symptoms of low red blood cells or anemia such as unusually weak or tired; feeling faint or lightheaded; falls  signs and symptoms of muscle injury like dark urine; trouble passing urine or change in the amount of urine; unusually weak or tired; muscle pain; back pain Side effects that usually do not require medical attention (report to your doctor or health care professional if they continue or are bothersome):  changes in taste  diarrhea  gas  hair loss  loss of appetite  mouth sores This list may not describe all possible side effects. Call your doctor for medical advice about side effects. You may report side effects to FDA at 1-800-FDA-1088. Where should I keep my medicine? This drug is given in a hospital or clinic and will not be stored at home. NOTE: This sheet is a summary. It may not cover all possible information. If you have questions about this medicine, talk to your doctor, pharmacist, or health care provider.  2021 Elsevier/Gold Standard (2018-08-16 12:20:35)   Leucovorin injection What is this medicine? LEUCOVORIN (loo koe VOR in) is used to prevent or treat the harmful effects of some medicines. This medicine is used to treat anemia caused by a low amount of folic acid in the body. It is also used with 5-fluorouracil (5-FU) to treat colon cancer. This medicine may be used for other purposes; ask your health care provider or pharmacist if you have questions. What should I tell my health care provider before I take this medicine? They need to know if you have any of these conditions:  anemia from low levels of vitamin B-12 in the blood  an unusual or allergic reaction to leucovorin, folic acid, other medicines, foods, dyes, or preservatives  pregnant or trying to get pregnant  breast-feeding How should I use this medicine? This medicine is for injection into a muscle or into a vein. It is given by a health care professional in a hospital or  clinic setting. Talk to your pediatrician regarding the use of this medicine in children. Special care may be needed. Overdosage: If you think you have taken too much of this medicine contact a poison control center or emergency room at once. NOTE: This medicine is only for you. Do not share this medicine with others. What if I miss a dose? This does not apply. What may interact with this medicine?  capecitabine  fluorouracil  phenobarbital  phenytoin  primidone  trimethoprim-sulfamethoxazole This list may not describe all possible interactions. Give your health care provider a list of all the medicines, herbs, non-prescription drugs, or dietary supplements you use. Also tell them if you smoke, drink alcohol, or use illegal drugs. Some items may interact with your medicine. What should I watch for while using this medicine? Your condition will be monitored carefully while you are receiving this medicine. This medicine may increase the side effects of 5-fluorouracil, 5-FU. Tell your doctor or health care professional if you have diarrhea or mouth sores that do not get better or that get worse. What side effects may I notice from receiving this medicine? Side effects that you should report to your doctor or health care professional as soon as possible:  allergic reactions like skin rash, itching or hives, swelling of the face, lips, or tongue  breathing problems  fever, infection  mouth sores  unusual bleeding or bruising  unusually weak or tired  Side effects that usually do not require medical attention (report to your doctor or health care professional if they continue or are bothersome):  constipation or diarrhea  loss of appetite  nausea, vomiting This list may not describe all possible side effects. Call your doctor for medical advice about side effects. You may report side effects to FDA at 1-800-FDA-1088. Where should I keep my medicine? This drug is given in a  hospital or clinic and will not be stored at home. NOTE: This sheet is a summary. It may not cover all possible information. If you have questions about this medicine, talk to your doctor, pharmacist, or health care provider.  2021 Elsevier/Gold Standard (2007-10-03 16:50:29)  Fluorouracil, 5-FU injection What is this medicine? FLUOROURACIL, 5-FU (flure oh YOOR a sil) is a chemotherapy drug. It slows the growth of cancer cells. This medicine is used to treat many types of cancer like breast cancer, colon or rectal cancer, pancreatic cancer, and stomach cancer. This medicine may be used for other purposes; ask your health care provider or pharmacist if you have questions. COMMON BRAND NAME(S): Adrucil What should I tell my health care provider before I take this medicine? They need to know if you have any of these conditions:  blood disorders  dihydropyrimidine dehydrogenase (DPD) deficiency  infection (especially a virus infection such as chickenpox, cold sores, or herpes)  kidney disease  liver disease  malnourished, poor nutrition  recent or ongoing radiation therapy  an unusual or allergic reaction to fluorouracil, other chemotherapy, other medicines, foods, dyes, or preservatives  pregnant or trying to get pregnant  breast-feeding How should I use this medicine? This drug is given as an infusion or injection into a vein. It is administered in a hospital or clinic by a specially trained health care professional. Talk to your pediatrician regarding the use of this medicine in children. Special care may be needed. Overdosage: If you think you have taken too much of this medicine contact a poison control center or emergency room at once. NOTE: This medicine is only for you. Do not share this medicine with others. What if I miss a dose? It is important not to miss your dose. Call your doctor or health care professional if you are unable to keep an appointment. What may interact  with this medicine? Do not take this medicine with any of the following medications:  live virus vaccines This medicine may also interact with the following medications:  medicines that treat or prevent blood clots like warfarin, enoxaparin, and dalteparin This list may not describe all possible interactions. Give your health care provider a list of all the medicines, herbs, non-prescription drugs, or dietary supplements you use. Also tell them if you smoke, drink alcohol, or use illegal drugs. Some items may interact with your medicine. What should I watch for while using this medicine? Visit your doctor for checks on your progress. This drug may make you feel generally unwell. This is not uncommon, as chemotherapy can affect healthy cells as well as cancer cells. Report any side effects. Continue your course of treatment even though you feel ill unless your doctor tells you to stop. In some cases, you may be given additional medicines to help with side effects. Follow all directions for their use. Call your doctor or health care professional for advice if you get a fever, chills or sore throat, or other symptoms of a cold or flu. Do not treat yourself. This drug decreases your body's ability to fight  infections. Try to avoid being around people who are sick. This medicine may increase your risk to bruise or bleed. Call your doctor or health care professional if you notice any unusual bleeding. Be careful brushing and flossing your teeth or using a toothpick because you may get an infection or bleed more easily. If you have any dental work done, tell your dentist you are receiving this medicine. Avoid taking products that contain aspirin, acetaminophen, ibuprofen, naproxen, or ketoprofen unless instructed by your doctor. These medicines may hide a fever. Do not become pregnant while taking this medicine. Women should inform their doctor if they wish to become pregnant or think they might be pregnant.  There is a potential for serious side effects to an unborn child. Talk to your health care professional or pharmacist for more information. Do not breast-feed an infant while taking this medicine. Men should inform their doctor if they wish to father a child. This medicine may lower sperm counts. Do not treat diarrhea with over the counter products. Contact your doctor if you have diarrhea that lasts more than 2 days or if it is severe and watery. This medicine can make you more sensitive to the sun. Keep out of the sun. If you cannot avoid being in the sun, wear protective clothing and use sunscreen. Do not use sun lamps or tanning beds/booths. What side effects may I notice from receiving this medicine? Side effects that you should report to your doctor or health care professional as soon as possible:  allergic reactions like skin rash, itching or hives, swelling of the face, lips, or tongue  low blood counts - this medicine may decrease the number of white blood cells, red blood cells and platelets. You may be at increased risk for infections and bleeding.  signs of infection - fever or chills, cough, sore throat, pain or difficulty passing urine  signs of decreased platelets or bleeding - bruising, pinpoint red spots on the skin, black, tarry stools, blood in the urine  signs of decreased red blood cells - unusually weak or tired, fainting spells, lightheadedness  breathing problems  changes in vision  chest pain  mouth sores  nausea and vomiting  pain, swelling, redness at site where injected  pain, tingling, numbness in the hands or feet  redness, swelling, or sores on hands or feet  stomach pain  unusual bleeding Side effects that usually do not require medical attention (report to your doctor or health care professional if they continue or are bothersome):  changes in finger or toe nails  diarrhea  dry or itchy skin  hair loss  headache  loss of  appetite  sensitivity of eyes to the light  stomach upset  unusually teary eyes This list may not describe all possible side effects. Call your doctor for medical advice about side effects. You may report side effects to FDA at 1-800-FDA-1088. Where should I keep my medicine? This drug is given in a hospital or clinic and will not be stored at home. NOTE: This sheet is a summary. It may not cover all possible information. If you have questions about this medicine, talk to your doctor, pharmacist, or health care provider.  2021 Elsevier/Gold Standard (2019-02-27 15:00:03)

## 2020-06-09 NOTE — Telephone Encounter (Signed)
I spoke to the patient and confirmed 3/8 appointment and Coumadin dosing the next few days.  She verbalized understanding.

## 2020-06-09 NOTE — Telephone Encounter (Signed)
I left patient message in regards to INR and recent infusions.    I informed her to hold Coumadin tonight and Tuesday night.  I also told her that we need to schedule INR check per Dr Agustin Cree on 3/8.

## 2020-06-10 ENCOUNTER — Telehealth: Payer: Self-pay | Admitting: Oncology

## 2020-06-10 NOTE — Telephone Encounter (Signed)
Scheduled appointments per 2/28 los. Spoke to patient who is aware of appointments date and times.

## 2020-06-11 ENCOUNTER — Inpatient Hospital Stay: Payer: Medicare Other | Attending: Nurse Practitioner

## 2020-06-11 ENCOUNTER — Other Ambulatory Visit: Payer: Self-pay

## 2020-06-11 VITALS — BP 124/74 | HR 62 | Temp 98.9°F | Resp 18

## 2020-06-11 DIAGNOSIS — Z7901 Long term (current) use of anticoagulants: Secondary | ICD-10-CM | POA: Diagnosis not present

## 2020-06-11 DIAGNOSIS — E119 Type 2 diabetes mellitus without complications: Secondary | ICD-10-CM | POA: Diagnosis not present

## 2020-06-11 DIAGNOSIS — C50911 Malignant neoplasm of unspecified site of right female breast: Secondary | ICD-10-CM | POA: Insufficient documentation

## 2020-06-11 DIAGNOSIS — C180 Malignant neoplasm of cecum: Secondary | ICD-10-CM | POA: Insufficient documentation

## 2020-06-11 DIAGNOSIS — Z79811 Long term (current) use of aromatase inhibitors: Secondary | ICD-10-CM | POA: Diagnosis not present

## 2020-06-11 DIAGNOSIS — F17210 Nicotine dependence, cigarettes, uncomplicated: Secondary | ICD-10-CM | POA: Diagnosis not present

## 2020-06-11 DIAGNOSIS — C50411 Malignant neoplasm of upper-outer quadrant of right female breast: Secondary | ICD-10-CM

## 2020-06-11 DIAGNOSIS — Z79899 Other long term (current) drug therapy: Secondary | ICD-10-CM | POA: Insufficient documentation

## 2020-06-11 DIAGNOSIS — Z923 Personal history of irradiation: Secondary | ICD-10-CM | POA: Insufficient documentation

## 2020-06-11 DIAGNOSIS — D508 Other iron deficiency anemias: Secondary | ICD-10-CM | POA: Insufficient documentation

## 2020-06-11 DIAGNOSIS — I4891 Unspecified atrial fibrillation: Secondary | ICD-10-CM | POA: Insufficient documentation

## 2020-06-11 DIAGNOSIS — C182 Malignant neoplasm of ascending colon: Secondary | ICD-10-CM

## 2020-06-11 DIAGNOSIS — Z5111 Encounter for antineoplastic chemotherapy: Secondary | ICD-10-CM | POA: Insufficient documentation

## 2020-06-11 DIAGNOSIS — Z170 Estrogen receptor positive status [ER+]: Secondary | ICD-10-CM | POA: Diagnosis not present

## 2020-06-11 DIAGNOSIS — E039 Hypothyroidism, unspecified: Secondary | ICD-10-CM | POA: Insufficient documentation

## 2020-06-11 MED ORDER — SODIUM CHLORIDE 0.9% FLUSH
10.0000 mL | INTRAVENOUS | Status: DC | PRN
  Administered 2020-06-11: 10 mL
  Filled 2020-06-11: qty 10

## 2020-06-11 MED ORDER — HEPARIN SOD (PORK) LOCK FLUSH 100 UNIT/ML IV SOLN
500.0000 [IU] | Freq: Once | INTRAVENOUS | Status: AC | PRN
  Administered 2020-06-11: 500 [IU]
  Filled 2020-06-11: qty 5

## 2020-06-11 NOTE — Patient Instructions (Signed)

## 2020-06-17 ENCOUNTER — Ambulatory Visit (INDEPENDENT_AMBULATORY_CARE_PROVIDER_SITE_OTHER): Payer: Medicare Other

## 2020-06-17 ENCOUNTER — Other Ambulatory Visit: Payer: Self-pay

## 2020-06-17 DIAGNOSIS — I480 Paroxysmal atrial fibrillation: Secondary | ICD-10-CM

## 2020-06-17 DIAGNOSIS — Z7901 Long term (current) use of anticoagulants: Secondary | ICD-10-CM | POA: Diagnosis not present

## 2020-06-17 DIAGNOSIS — Z5181 Encounter for therapeutic drug level monitoring: Secondary | ICD-10-CM | POA: Diagnosis not present

## 2020-06-17 LAB — POCT INR: INR: 2.1 (ref 2.0–3.0)

## 2020-06-17 NOTE — Patient Instructions (Signed)
Continue 10mg  daily except 5mg  on Mondays, Wednesdays and Saturdays.  Recheck INR in 3 weeks. [phone]

## 2020-06-22 ENCOUNTER — Other Ambulatory Visit: Payer: Self-pay | Admitting: Oncology

## 2020-06-23 ENCOUNTER — Encounter: Payer: Self-pay | Admitting: *Deleted

## 2020-06-23 ENCOUNTER — Inpatient Hospital Stay (HOSPITAL_BASED_OUTPATIENT_CLINIC_OR_DEPARTMENT_OTHER): Payer: Medicare Other | Admitting: Oncology

## 2020-06-23 ENCOUNTER — Inpatient Hospital Stay: Payer: Medicare Other

## 2020-06-23 ENCOUNTER — Telehealth: Payer: Self-pay | Admitting: *Deleted

## 2020-06-23 ENCOUNTER — Telehealth: Payer: Self-pay | Admitting: Nurse Practitioner

## 2020-06-23 ENCOUNTER — Telehealth: Payer: Self-pay

## 2020-06-23 ENCOUNTER — Other Ambulatory Visit: Payer: Self-pay

## 2020-06-23 ENCOUNTER — Telehealth: Payer: Self-pay | Admitting: Oncology

## 2020-06-23 VITALS — BP 153/73 | HR 63 | Temp 97.8°F | Resp 18 | Ht 70.0 in | Wt 156.0 lb

## 2020-06-23 DIAGNOSIS — C182 Malignant neoplasm of ascending colon: Secondary | ICD-10-CM

## 2020-06-23 DIAGNOSIS — I480 Paroxysmal atrial fibrillation: Secondary | ICD-10-CM

## 2020-06-23 DIAGNOSIS — Z170 Estrogen receptor positive status [ER+]: Secondary | ICD-10-CM

## 2020-06-23 DIAGNOSIS — Z5111 Encounter for antineoplastic chemotherapy: Secondary | ICD-10-CM | POA: Diagnosis not present

## 2020-06-23 LAB — CBC WITH DIFFERENTIAL (CANCER CENTER ONLY)
Abs Immature Granulocytes: 0 10*3/uL (ref 0.00–0.07)
Basophils Absolute: 0 10*3/uL (ref 0.0–0.1)
Basophils Relative: 1 %
Eosinophils Absolute: 0 10*3/uL (ref 0.0–0.5)
Eosinophils Relative: 1 %
HCT: 37.1 % (ref 36.0–46.0)
Hemoglobin: 10.8 g/dL — ABNORMAL LOW (ref 12.0–15.0)
Immature Granulocytes: 0 %
Lymphocytes Relative: 20 %
Lymphs Abs: 0.8 10*3/uL (ref 0.7–4.0)
MCH: 23.2 pg — ABNORMAL LOW (ref 26.0–34.0)
MCHC: 29.1 g/dL — ABNORMAL LOW (ref 30.0–36.0)
MCV: 79.8 fL — ABNORMAL LOW (ref 80.0–100.0)
Monocytes Absolute: 0.5 10*3/uL (ref 0.1–1.0)
Monocytes Relative: 11 %
Neutro Abs: 2.9 10*3/uL (ref 1.7–7.7)
Neutrophils Relative %: 67 %
Platelet Count: 123 10*3/uL — ABNORMAL LOW (ref 150–400)
RBC: 4.65 MIL/uL (ref 3.87–5.11)
RDW: 23 % — ABNORMAL HIGH (ref 11.5–15.5)
WBC Count: 4.3 10*3/uL (ref 4.0–10.5)
nRBC: 0 % (ref 0.0–0.2)

## 2020-06-23 LAB — CMP (CANCER CENTER ONLY)
ALT: 13 U/L (ref 0–44)
AST: 21 U/L (ref 15–41)
Albumin: 3.8 g/dL (ref 3.5–5.0)
Alkaline Phosphatase: 96 U/L (ref 38–126)
Anion gap: 7 (ref 5–15)
BUN: 9 mg/dL (ref 8–23)
CO2: 28 mmol/L (ref 22–32)
Calcium: 9.3 mg/dL (ref 8.9–10.3)
Chloride: 105 mmol/L (ref 98–111)
Creatinine: 0.66 mg/dL (ref 0.44–1.00)
GFR, Estimated: >60 mL/min (ref >60)
Glucose, Bld: 137 mg/dL — ABNORMAL HIGH (ref 70–99)
Potassium: 4.2 mmol/L (ref 3.5–5.1)
Sodium: 140 mmol/L (ref 135–145)
Total Bilirubin: 0.2 mg/dL — ABNORMAL LOW (ref 0.3–1.2)
Total Protein: 7 g/dL (ref 6.5–8.1)

## 2020-06-23 LAB — PROTIME-INR
INR: 4.1 (ref 0.8–1.2)
Prothrombin Time: 38.9 seconds — ABNORMAL HIGH (ref 11.4–15.2)

## 2020-06-23 MED ORDER — LEUCOVORIN CALCIUM INJECTION 350 MG
400.0000 mg/m2 | Freq: Once | INTRAVENOUS | Status: AC
  Administered 2020-06-23: 740 mg via INTRAVENOUS
  Filled 2020-06-23: qty 37

## 2020-06-23 MED ORDER — DEXTROSE 5 % IV SOLN
Freq: Once | INTRAVENOUS | Status: AC
  Filled 2020-06-23: qty 250

## 2020-06-23 MED ORDER — PALONOSETRON HCL INJECTION 0.25 MG/5ML
INTRAVENOUS | Status: AC
  Filled 2020-06-23: qty 5

## 2020-06-23 MED ORDER — SODIUM CHLORIDE 0.9 % IV SOLN
10.0000 mg | Freq: Once | INTRAVENOUS | Status: AC
  Administered 2020-06-23: 10 mg via INTRAVENOUS
  Filled 2020-06-23: qty 10

## 2020-06-23 MED ORDER — FAMOTIDINE IN NACL 20-0.9 MG/50ML-% IV SOLN
INTRAVENOUS | Status: AC
  Filled 2020-06-23: qty 50

## 2020-06-23 MED ORDER — HEPARIN SOD (PORK) LOCK FLUSH 100 UNIT/ML IV SOLN
500.0000 [IU] | Freq: Once | INTRAVENOUS | Status: DC | PRN
  Filled 2020-06-23: qty 5

## 2020-06-23 MED ORDER — SODIUM CHLORIDE 0.9% FLUSH
10.0000 mL | INTRAVENOUS | Status: DC | PRN
  Filled 2020-06-23: qty 10

## 2020-06-23 MED ORDER — OXALIPLATIN CHEMO INJECTION 100 MG/20ML
82.0000 mg/m2 | Freq: Once | INTRAVENOUS | Status: AC
  Administered 2020-06-23: 150 mg via INTRAVENOUS
  Filled 2020-06-23: qty 20

## 2020-06-23 MED ORDER — FLUOROURACIL CHEMO INJECTION 2.5 GM/50ML
400.0000 mg/m2 | Freq: Once | INTRAVENOUS | Status: AC
  Administered 2020-06-23: 750 mg via INTRAVENOUS
  Filled 2020-06-23: qty 15

## 2020-06-23 MED ORDER — PALONOSETRON HCL INJECTION 0.25 MG/5ML
0.2500 mg | Freq: Once | INTRAVENOUS | Status: AC
  Administered 2020-06-23: 0.25 mg via INTRAVENOUS

## 2020-06-23 MED ORDER — SODIUM CHLORIDE 0.9 % IV SOLN
2400.0000 mg/m2 | INTRAVENOUS | Status: DC
  Administered 2020-06-23: 4450 mg via INTRAVENOUS
  Filled 2020-06-23: qty 89

## 2020-06-23 NOTE — Telephone Encounter (Addendum)
Called [facility] w/INR and Dr. Benay Spice instructions to hold coumadin today and tomorrow. They agreed and requested re-check on Wednesday. Patient notified.

## 2020-06-23 NOTE — Telephone Encounter (Signed)
I received a call from [facility]).  Pt's INR today was 4.1.  I instructed to have the patient hold today and Tuesday.  She will repeat INR check 3/16.

## 2020-06-23 NOTE — Telephone Encounter (Signed)
Scheduled per 03/14 message, patient has been called and notified of 03/16 updated appointment.

## 2020-06-23 NOTE — Patient Instructions (Signed)
[facility] Discharge Instructions for Patients Receiving Chemotherapy  Today you received the following chemotherapy agents Oxaliplatin, Leukovorin, Florouracil, Pump  To help prevent nausea and vomiting after your treatment, we encourage you to take your nausea medication as directed.  If you develop nausea and vomiting that is not controlled by your nausea medication, call the clinic.   BELOW ARE SYMPTOMS THAT SHOULD BE REPORTED IMMEDIATELY:  *FEVER GREATER THAN 100.5 F  *CHILLS WITH OR WITHOUT FEVER  NAUSEA AND VOMITING THAT IS NOT CONTROLLED WITH YOUR NAUSEA MEDICATION  *UNUSUAL SHORTNESS OF BREATH  *UNUSUAL BRUISING OR BLEEDING  TENDERNESS IN MOUTH AND THROAT WITH OR WITHOUT PRESENCE OF ULCERS  *URINARY PROBLEMS  *BOWEL PROBLEMS  UNUSUAL RASH Items with * indicate a potential emergency and should be followed up as soon as possible.  Feel free to call the clinic should you have any questions or concerns. The clinic phone number is [phone].  Please show the [facility] at check-in to the Emergency Department and triage nurse.

## 2020-06-23 NOTE — Progress Notes (Unsigned)
PT INR 4.1 was given to [facility] at [place] [facility] on 06/23/2020  by Dorian Furnace ([place])

## 2020-06-23 NOTE — Telephone Encounter (Signed)
CRITICAL VALUE STICKER  CRITICAL VALUE: INR 4.1   RECEIVER (on-site recipient of call):Maygan Koeller,RN   DATE & TIME NOTIFIED: [phone]  MESSENGER (representative from lab): Rosann Auerbach  MD NOTIFIED: Dr. Benay Spice  TIME OF NOTIFICATION: 0900  RESPONSE: Hold coumadin today and call results to cardiology. Can check INR again on 3/16 if needed.

## 2020-06-23 NOTE — Telephone Encounter (Signed)
Scheduled per 03/15 scheduled message, left a voicemail for patient.

## 2020-06-23 NOTE — Progress Notes (Signed)
[facility] OFFICE PROGRESS NOTE   Diagnosis: Colon cancer  INTERVAL HISTORY:   [name] complete another cycle of FOLFOX on 06/09/2020.  No nausea/vomiting or mouth sores.  She has occasional mild diarrhea, once or twice daily.  Cold sensitivity lasted for 1.5 weeks following chemotherapy.  No other neuropathy symptoms.  She feels well.  She continues smoking.  Objective:  Vital signs in last 24 hours:  Blood pressure (!) 153/73, pulse 63, temperature 97.8 F (36.6 C), temperature source Tympanic, resp. rate 18, height 5' 10" (1.778 m), weight 156 lb (70.8 kg), SpO2 98 %.    HEENT: No thrush or ulcers Resp: Distant breath sounds with scattered inspiratory rhonchi and wheeze, no respiratory distress Cardio: Regular rate and rhythm GI: No hepatosplenomegaly, nontender Vascular: The left lower leg is slightly larger than the right side, bilateral varicosities, no erythema or edema  Skin: Palms without erythema  Portacath/PICC-without erythema  Lab Results:  Lab Results  Component Value Date   WBC 4.3 06/23/2020   HGB 10.8 (L) 06/23/2020   HCT 37.1 06/23/2020   MCV 79.8 (L) 06/23/2020   PLT 123 (L) 06/23/2020   NEUTROABS 2.9 06/23/2020    CMP  Lab Results  Component Value Date   NA 139 06/09/2020   K 3.8 06/09/2020   CL 104 06/09/2020   CO2 28 06/09/2020   GLUCOSE 157 (H) 06/09/2020   BUN 11 06/09/2020   CREATININE 0.66 06/09/2020   CALCIUM 9.3 06/09/2020   PROT 6.9 06/09/2020   ALBUMIN 3.8 06/09/2020   AST 15 06/09/2020   ALT 7 06/09/2020   ALKPHOS 94 06/09/2020   BILITOT <0.2 (L) 06/09/2020   GFRNONAA >60 06/09/2020   GFRAA >60 08/17/2019    Lab Results  Component Value Date   CEA1 2.37 05/05/2020    Lab Results  Component Value Date   INR 2.1 06/17/2020    Medications: I have reviewed the patient's current medications.   Assessment/Plan: 1. 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 Moderately differentiated adenocarcinoma, 9/30 lymph nodes positive, lymphovascular invasion present including large vessel extramural invasion, loss of MLH1 and PMS2, MLH1 hyper methylation present ? Incomplete colonoscopy secondary to stricturing at the sigmoid colon ? Virtual colonoscopy 03/13/2020- mass centered at the medial wall of the cecum and terminal ileum, adjacent ileocolic mesenteric adenopathy, nonspecific 3 mm right lower lobe nodule ? PET 09/81/1914 hypermetabolic cecal/terminal ileum mass with adjacent enlarged and hypermetabolic ileocolic lymph nodes, previously noted segment 7 liver lesion-not hypermetabolic- benign etiology favored, dominant left lower lobe pulmonary nodule not hypermetabolic, other tiny nodules not hypermetabolic and below PET sensitivity ? Cycle 1 FOLFOX 05/12/2020 ? Cycle 2 FOLFOX 05/26/2020 ? Cycle 3 FOLFOX 06/09/2020 ? Cycle 4 FOLFOX 06/23/2020 2. Iron deficiency anemia secondary to #1 3. Right breast cancer, stage Ia, T1b, N0, ER positive, PR positive, HER2 positive status post a right lumpectomy and adjuvant radiation/Taxol/Herceptin, anastrozole starting 01/25/2019 4. Diabetes 5. Atrial fibrillation-maintained on Coumadin 6. Hypothyroidism 7. Ongoing tobacco use 8. Family history of breast cancer-negative genetic testing      Disposition: Sabrina [name] has completed 3 cycles of FOLFOX.  She has tolerated the chemotherapy well.  She will complete cycle 4 today.  We will follow up on the PT/INR from today.  Ms. Pieri will return for an office visit and chemotherapy in 2 weeks.  She has decided to remain at the [facility] to complete adjuvant therapy.  I will schedule  her to see Dr. Gudena in 4 weeks.  Gary Sherrill, MD  06/23/2020  8:41 AM   

## 2020-06-25 ENCOUNTER — Other Ambulatory Visit: Payer: Medicare Other

## 2020-06-25 ENCOUNTER — Other Ambulatory Visit: Payer: Self-pay

## 2020-06-25 ENCOUNTER — Inpatient Hospital Stay: Payer: Medicare Other

## 2020-06-25 VITALS — BP 135/63 | HR 54 | Resp 18

## 2020-06-25 DIAGNOSIS — C50411 Malignant neoplasm of upper-outer quadrant of right female breast: Secondary | ICD-10-CM

## 2020-06-25 DIAGNOSIS — C182 Malignant neoplasm of ascending colon: Secondary | ICD-10-CM

## 2020-06-25 DIAGNOSIS — I480 Paroxysmal atrial fibrillation: Secondary | ICD-10-CM

## 2020-06-25 DIAGNOSIS — Z5111 Encounter for antineoplastic chemotherapy: Secondary | ICD-10-CM | POA: Diagnosis not present

## 2020-06-25 LAB — PROTIME-INR
INR: 2 — ABNORMAL HIGH (ref 0.8–1.2)
Prothrombin Time: 22.3 seconds — ABNORMAL HIGH (ref 11.4–15.2)

## 2020-06-25 MED ORDER — HEPARIN SOD (PORK) LOCK FLUSH 100 UNIT/ML IV SOLN
500.0000 [IU] | Freq: Once | INTRAVENOUS | Status: AC | PRN
  Administered 2020-06-25: 500 [IU]
  Filled 2020-06-25: qty 5

## 2020-06-25 MED ORDER — SODIUM CHLORIDE 0.9% FLUSH
10.0000 mL | INTRAVENOUS | Status: DC | PRN
  Administered 2020-06-25: 10 mL
  Filled 2020-06-25: qty 10

## 2020-06-25 NOTE — Patient Instructions (Signed)

## 2020-06-26 ENCOUNTER — Telehealth: Payer: Self-pay | Admitting: *Deleted

## 2020-06-26 ENCOUNTER — Ambulatory Visit (INDEPENDENT_AMBULATORY_CARE_PROVIDER_SITE_OTHER): Payer: Medicare Other | Admitting: Cardiology

## 2020-06-26 ENCOUNTER — Encounter: Payer: Self-pay | Admitting: Cardiology

## 2020-06-26 ENCOUNTER — Ambulatory Visit (INDEPENDENT_AMBULATORY_CARE_PROVIDER_SITE_OTHER): Payer: Medicare Other | Admitting: Pharmacist

## 2020-06-26 VITALS — BP 128/60 | HR 61 | Ht 70.0 in | Wt 155.8 lb

## 2020-06-26 DIAGNOSIS — Z7901 Long term (current) use of anticoagulants: Secondary | ICD-10-CM

## 2020-06-26 DIAGNOSIS — C50411 Malignant neoplasm of upper-outer quadrant of right female breast: Secondary | ICD-10-CM

## 2020-06-26 DIAGNOSIS — I10 Essential (primary) hypertension: Secondary | ICD-10-CM | POA: Diagnosis not present

## 2020-06-26 DIAGNOSIS — E119 Type 2 diabetes mellitus without complications: Secondary | ICD-10-CM | POA: Diagnosis not present

## 2020-06-26 DIAGNOSIS — Z5181 Encounter for therapeutic drug level monitoring: Secondary | ICD-10-CM | POA: Diagnosis not present

## 2020-06-26 DIAGNOSIS — I480 Paroxysmal atrial fibrillation: Secondary | ICD-10-CM

## 2020-06-26 DIAGNOSIS — Z170 Estrogen receptor positive status [ER+]: Secondary | ICD-10-CM

## 2020-06-26 NOTE — Telephone Encounter (Signed)
Provided coumadin clinic w/INR results from 06/25/20 and patient notified that they will be calling her w/coumadin instructions.

## 2020-06-26 NOTE — Patient Instructions (Addendum)
Description    Continue 10mg  daily except 5mg  on Mondays, Wednesdays and Saturdays.  Recheck INR in 2 weeks. [phone]

## 2020-06-26 NOTE — [place] Notes (Signed)
Cardiology Office Note:    Date:  06/26/2020   ID:  Sabrina [name], DOB Aug 01, 1950, MRN [id number]  PCP:  Patient, No Pcp Per  Cardiologist:  Jenne Campus, MD    Referring MD: Enid Skeens., MD   No chief complaint on file. I am doing fine  History of Present Illness:    Sabrina [name] is a 70 y.o. female with past medical history significant for paroxysmal atrial fibrillation, successfully suppressed with flecainide, she is on Coumadin as per her wishes, chronic smoking, diabetes, essential hypertension, dyslipidemia, history of breast cancer, now history of colon cancer status post colectomy. She comes today 2 months for follow-up overall she says she feels tremendously better after surgery she is a getting chemotherapy this is already 4 courses and she is tolerating chemotherapy quite well.  Denies have any chest pain tightness squeezing pressure burning chest no palpitations no dizziness no swelling of lower extremities.  Sadly she still continues to smoke  Past Medical History:  Diagnosis Date  . Atrial fibrillation ([place]) [I48.91] 11/08/2016  . Benign essential hypertension 08/25/2015  . Colon cancer ([place]) 03/18/2020  . Diabetes ([place])   . Diabetes mellitus ([place]) 06/12/2018  . Dyslipidemia 02/13/2015  . Dysrhythmia    AFIB  . Encounter for therapeutic drug monitoring 11/08/2016  . Family history of breast cancer   . Family history of leukemia   . Family history of prostate cancer   . Genetic testing 07/05/2018   Negative genetic testing on the common hereditary cancer panel.  The Hereditary Gene Panel offered by [facility] includes sequencing and/or deletion duplication testing of the following 48 genes: APC, ATM, AXIN2, BARD1, BMPR1A, BRCA1, BRCA2, BRIP1, CDH1, CDK4, CDKN2A (p14ARF), CDKN2A (p16INK4a), CHEK2, CTNNA1, DICER1, EPCAM (Deletion/duplication testing only), GREM1 (promoter region deletion/duplicat  . Hyperlipidemia   . Hypothyroidism due to Hashimoto's thyroiditis  05/23/2015  . Iron deficiency anemia due to chronic blood loss 12/26/2019  . Long term (current) use of anticoagulants [Z79.01] 11/08/2016  . Malignant neoplasm of upper-outer quadrant of right breast in female, estrogen receptor positive ([place]) 06/26/2018  . Moderate episode of recurrent major depressive disorder ([place]) 12/01/2015  . Paroxysmal atrial fibrillation ([place]) 02/13/2015   Formatting of this note might be different from the original. Chads score 1, chads vas 2, anticoagulated with warfarin  . Personal history of chemotherapy   . Personal history of radiation therapy   . Port-A-Cath in place 08/24/2018  . Preop cardiovascular exam 07/05/2018   Negative genetic testing on the common hereditary cancer panel.  The Hereditary Gene Panel offered by [facility] includes sequencing and/or deletion duplication testing of the following 48 genes: APC, ATM, AXIN2, BARD1, BMPR1A, BRCA1, BRCA2, BRIP1, CDH1, CDK4, CDKN2A (p14ARF), CDKN2A (p16INK4a), CHEK2, CTNNA1, DICER1, EPCAM (Deletion/duplication testing only), GREM1 (promoter region deletion/duplicat  . Smoking 07/12/2017  . Statin intolerance 12/08/2016  . Type 2 diabetes mellitus without complication, without long-term current use of insulin ([place]) 05/23/2015    Past Surgical History:  Procedure Laterality Date  . ABDOMINAL HYSTERECTOMY    . APPENDECTOMY    . BREAST LUMPECTOMY Right 07/13/2018  . BREAST LUMPECTOMY WITH RADIOACTIVE SEED AND SENTINEL LYMPH NODE BIOPSY Right 07/13/2018   Procedure: RIGHT BREAST LUMPECTOMY WITH RADIOACTIVE SEED AND RIGHT SENTINEL LYMPH NODE MAPPING;  Surgeon: Erroll Luna, MD;  Location: [facility];  Service: General;  Laterality: Right;  . BREAST SURGERY    . CHOLECYSTECTOMY    . KNEE SURGERY    . PORT-A-CATH REMOVAL N/A 08/21/2019  Procedure: REMOVAL PORT-A-CATH;  Surgeon: Erroll Luna, MD;  Location: [facility];  Service: General;  Laterality: N/A;  . PORTACATH PLACEMENT Right 08/02/2018    Procedure: INSERTION PORT-A-CATH WITH ULTRASOUND;  Surgeon: Erroll Luna, MD;  Location: [facility];  Service: General;  Laterality: Right;    Current Medications: Current Meds  Medication Sig  . anastrozole (ARIMIDEX) 1 MG tablet TAKE 1 TABLET DAILY  . calcium carbonate (OSCAL) 1500 (600 Ca) MG TABS tablet Take 600 mg of elemental calcium by mouth 2 (two) times daily with a meal.  . digoxin (LANOXIN) 0.125 MG tablet Take 1 tablet (0.125 mg total) by mouth daily.  Marland Kitchen escitalopram (LEXAPRO) 10 MG tablet Take 10 mg by mouth daily.  . flecainide (TAMBOCOR) 100 MG tablet TAKE 1 TABLET TWICE A DAY  . ibuprofen (ADVIL) 800 MG tablet Take 1 tablet (800 mg total) by mouth every 8 (eight) hours as needed.  Marland Kitchen levothyroxine (SYNTHROID, LEVOTHROID) 100 MCG tablet Take 1 tablet by mouth daily.  Marland Kitchen lidocaine-prilocaine (EMLA) cream Apply 1 application topically as needed. Apply to [facility] ~ 1 hour prior to access  . lisinopril (PRINIVIL,ZESTRIL) 10 MG tablet Take 1 tablet (10 mg total) by mouth daily.  . metFORMIN (GLUCOPHAGE) 500 MG tablet Take 1 tablet by mouth 2 (two) times daily with a meal.   . metoprolol succinate (TOPROL-XL) 50 MG 24 hr tablet TAKE 1 TABLET DAILY (Patient taking differently: Take by mouth daily.)  . prochlorperazine (COMPAZINE) 10 MG tablet Take 1 tablet (10 mg total) by mouth every 6 (six) hours as needed for nausea or vomiting.  . warfarin (COUMADIN) 10 MG tablet TAKE ONE-HALF (1/2) TO ONE TABLET AS DIRECTED BY COUMADIN CLINIC     Allergies:   Atorvastatin and Meperidine   Social History   Socioeconomic History  . Marital status: Widowed    Spouse name: Not on file  . Number of children: Not on file  . Years of education: Not on file  . Highest education level: Not on file  Occupational History  . Not on file  Tobacco Use  . Smoking status: Current Every Day Smoker    Packs/day: 2.00    Years: 40.00    Pack years: 80.00  . Smokeless tobacco: Never  Used  Vaping Use  . Vaping Use: Never used  Substance and Sexual Activity  . Alcohol use: Never  . Drug use: Never  . Sexual activity: Not on file  Other Topics Concern  . Not on file  Social History Narrative  . Not on file   Social Determinants of Health   Financial Resource Strain: Not on file  Food Insecurity: Not on file  Transportation Needs: Not on file  Physical Activity: Not on file  Stress: Not on file  Social Connections: Not on file     Family History: The patient's family history includes Acute myelogenous leukemia in her brother; Alcohol abuse in her maternal aunt, maternal uncle, and paternal uncle; Breast cancer in her cousin and maternal aunt; Breast cancer (age of onset: 14) in her cousin; Breast cancer (age of onset: 47) in her mother; Cancer in her brother and maternal uncle; Diabetes in her maternal uncle, paternal uncle, and son; Hypertension in her maternal aunt and maternal grandmother; Prostate cancer in her father. ROS:   Please see the history of present illness.    All 14 point review of systems negative except as described per history of present illness  EKGs/Labs/Other Studies Reviewed:  Recent Labs: 06/23/2020: ALT 13; BUN 9; Creatinine 0.66; Hemoglobin 10.8; Platelet Count 123; Potassium 4.2; Sodium 140  Recent Lipid Panel No results found for: CHOL, TRIG, HDL, CHOLHDL, VLDL, LDLCALC, LDLDIRECT  Physical Exam:    VS:  BP 128/60   Pulse 61   Ht _0  (1.778 m)   Wt 155 lb 12.8 oz (70.7 kg)   SpO2 94%   BMI 22.35 kg/m     Wt Readings from Last 3 Encounters:  06/26/20 155 lb 12.8 oz (70.7 kg)  06/23/20 156 lb (70.8 kg)  06/09/20 158 lb 8 oz (71.9 kg)     GEN:  Well nourished, well developed in no acute distress HEENT: Normal NECK: No JVD; No carotid bruits LYMPHATICS: No lymphadenopathy CARDIAC: RRR, no murmurs, no rubs, no gallops RESPIRATORY:  Clear to auscultation without rales, wheezing or rhonchi  ABDOMEN: Soft,  non-tender, non-distended MUSCULOSKELETAL:  No edema; No deformity  SKIN: Warm and dry LOWER EXTREMITIES: no swelling NEUROLOGIC:  Alert and oriented x 3 PSYCHIATRIC:  Normal affect   ASSESSMENT:    1. Paroxysmal atrial fibrillation (HCC)   2. Benign essential hypertension   3. Type 2 diabetes mellitus without complication, without long-term current use of insulin ([place])   4. Malignant neoplasm of upper-outer quadrant of right breast in female, estrogen receptor positive ([place])   5. Long term (current) use of anticoagulants [Z79.01]    PLAN:    In order of problems listed above:  1. Paroxysmal atrial fibrillation EKG today showed normal rhythm, will continue present management which include flecainide as well as Coumadin as per her wishes. 2. Benign essential hypertension, blood pressure well controlled continue present management. 3. Type 2 diabetes that being followed by entire medicine team. 4. Malignant breast cancer in remission, 5. Colon cancer aggressively treated with oncology team.  She is on chemotherapy doing well and tolerating chemotherapy quite well. 6. Smoking obviously a problem again we discussed decision I told her she must quit she understand however honestly I doubt she would be able to do that.  Overall I find her in pretty good shape in pretty good spirits.  I see her back in 6 months.   Medication Adjustments/Labs and Tests Ordered: Current medicines are reviewed at length with the patient today.  Concerns regarding medicines are outlined above.  Orders Placed This Encounter  Procedures  . EKG 12-Lead   Medication changes: No orders of the defined types were placed in this encounter.   Signed, Park Liter, MD, [facility] 06/26/2020 2:12 PM    [place]

## 2020-06-26 NOTE — Patient Instructions (Signed)

## 2020-06-26 NOTE — Telephone Encounter (Signed)
-----   Message from Ladell Pier, MD sent at 06/25/2020  5:03 PM EDT ----- Please call patient, INR is better, send to coumadin clinic to decide on dosing

## 2020-07-04 ENCOUNTER — Other Ambulatory Visit: Payer: Self-pay

## 2020-07-04 ENCOUNTER — Ambulatory Visit
Admission: RE | Admit: 2020-07-04 | Discharge: 2020-07-04 | Disposition: A | Discharge status: Home / Self Care | Payer: Medicare Other | Source: Ambulatory Visit | Attending: Hematology and Oncology | Admitting: Hematology and Oncology

## 2020-07-04 DIAGNOSIS — C50411 Malignant neoplasm of upper-outer quadrant of right female breast: Secondary | ICD-10-CM

## 2020-07-06 ENCOUNTER — Other Ambulatory Visit: Payer: Self-pay | Admitting: Oncology

## 2020-07-07 ENCOUNTER — Inpatient Hospital Stay: Payer: Medicare Other

## 2020-07-07 ENCOUNTER — Encounter: Payer: Self-pay | Admitting: Nurse Practitioner

## 2020-07-07 ENCOUNTER — Telehealth: Payer: Self-pay | Admitting: *Deleted

## 2020-07-07 ENCOUNTER — Inpatient Hospital Stay (HOSPITAL_BASED_OUTPATIENT_CLINIC_OR_DEPARTMENT_OTHER): Payer: Medicare Other | Admitting: Nurse Practitioner

## 2020-07-07 ENCOUNTER — Encounter: Payer: Self-pay | Admitting: *Deleted

## 2020-07-07 ENCOUNTER — Other Ambulatory Visit: Payer: Self-pay

## 2020-07-07 VITALS — BP 131/62 | HR 68 | Temp 97.7°F | Resp 14 | Ht 70.0 in | Wt 155.1 lb

## 2020-07-07 DIAGNOSIS — C182 Malignant neoplasm of ascending colon: Secondary | ICD-10-CM

## 2020-07-07 DIAGNOSIS — I480 Paroxysmal atrial fibrillation: Secondary | ICD-10-CM

## 2020-07-07 DIAGNOSIS — Z170 Estrogen receptor positive status [ER+]: Secondary | ICD-10-CM

## 2020-07-07 DIAGNOSIS — Z5111 Encounter for antineoplastic chemotherapy: Secondary | ICD-10-CM | POA: Diagnosis not present

## 2020-07-07 LAB — CMP (CANCER CENTER ONLY)
ALT: 17 U/L (ref 0–44)
AST: 25 U/L (ref 15–41)
Albumin: 3.8 g/dL (ref 3.5–5.0)
Alkaline Phosphatase: 111 U/L (ref 38–126)
Anion gap: 11 (ref 5–15)
BUN: 13 mg/dL (ref 8–23)
CO2: 29 mmol/L (ref 22–32)
Calcium: 9.3 mg/dL (ref 8.9–10.3)
Chloride: 100 mmol/L (ref 98–111)
Creatinine: 0.64 mg/dL (ref 0.44–1.00)
GFR, Estimated: >60 mL/min (ref >60)
Glucose, Bld: 112 mg/dL — ABNORMAL HIGH (ref 70–99)
Potassium: 4 mmol/L (ref 3.5–5.1)
Sodium: 140 mmol/L (ref 135–145)
Total Bilirubin: 0.2 mg/dL — ABNORMAL LOW (ref 0.3–1.2)
Total Protein: 7.3 g/dL (ref 6.5–8.1)

## 2020-07-07 LAB — CBC WITH DIFFERENTIAL (CANCER CENTER ONLY)
Abs Immature Granulocytes: 0.01 10*3/uL (ref 0.00–0.07)
Basophils Absolute: 0 10*3/uL (ref 0.0–0.1)
Basophils Relative: 0 %
Eosinophils Absolute: 0 10*3/uL (ref 0.0–0.5)
Eosinophils Relative: 1 %
HCT: 36.2 % (ref 36.0–46.0)
Hemoglobin: 10.8 g/dL — ABNORMAL LOW (ref 12.0–15.0)
Immature Granulocytes: 0 %
Lymphocytes Relative: 22 %
Lymphs Abs: 1.2 10*3/uL (ref 0.7–4.0)
MCH: 23.7 pg — ABNORMAL LOW (ref 26.0–34.0)
MCHC: 29.8 g/dL — ABNORMAL LOW (ref 30.0–36.0)
MCV: 79.6 fL — ABNORMAL LOW (ref 80.0–100.0)
Monocytes Absolute: 0.6 10*3/uL (ref 0.1–1.0)
Monocytes Relative: 11 %
Neutro Abs: 3.4 10*3/uL (ref 1.7–7.7)
Neutrophils Relative %: 66 %
Platelet Count: 156 10*3/uL (ref 150–400)
RBC: 4.55 MIL/uL (ref 3.87–5.11)
RDW: 24.1 % — ABNORMAL HIGH (ref 11.5–15.5)
WBC Count: 5.2 10*3/uL (ref 4.0–10.5)
nRBC: 0 % (ref 0.0–0.2)

## 2020-07-07 LAB — PROTIME-INR
INR: 5 (ref 0.8–1.2)
Prothrombin Time: 44.8 seconds — ABNORMAL HIGH (ref 11.4–15.2)

## 2020-07-07 MED ORDER — OXALIPLATIN CHEMO INJECTION 100 MG/20ML
85.0000 mg/m2 | Freq: Once | INTRAVENOUS | Status: AC
  Administered 2020-07-07: 155 mg via INTRAVENOUS
  Filled 2020-07-07: qty 31

## 2020-07-07 MED ORDER — FLUOROURACIL CHEMO INJECTION 2.5 GM/50ML
400.0000 mg/m2 | Freq: Once | INTRAVENOUS | Status: AC
  Administered 2020-07-07: 750 mg via INTRAVENOUS
  Filled 2020-07-07: qty 15

## 2020-07-07 MED ORDER — PALONOSETRON HCL INJECTION 0.25 MG/5ML
0.2500 mg | Freq: Once | INTRAVENOUS | Status: AC
  Administered 2020-07-07: 0.25 mg via INTRAVENOUS

## 2020-07-07 MED ORDER — SODIUM CHLORIDE 0.9% FLUSH
10.0000 mL | INTRAVENOUS | Status: DC | PRN
  Filled 2020-07-07: qty 10

## 2020-07-07 MED ORDER — LEUCOVORIN CALCIUM INJECTION 350 MG
400.0000 mg/m2 | Freq: Once | INTRAVENOUS | Status: AC
  Administered 2020-07-07: 740 mg via INTRAVENOUS
  Filled 2020-07-07: qty 37

## 2020-07-07 MED ORDER — PALONOSETRON HCL INJECTION 0.25 MG/5ML
INTRAVENOUS | Status: AC
  Filled 2020-07-07: qty 5

## 2020-07-07 MED ORDER — SODIUM CHLORIDE 0.9 % IV SOLN
2400.0000 mg/m2 | INTRAVENOUS | Status: DC
  Administered 2020-07-07: 4450 mg via INTRAVENOUS
  Filled 2020-07-07: qty 89

## 2020-07-07 MED ORDER — DEXTROSE 5 % IV SOLN
Freq: Once | INTRAVENOUS | Status: AC
  Filled 2020-07-07: qty 250

## 2020-07-07 MED ORDER — HEPARIN SOD (PORK) LOCK FLUSH 100 UNIT/ML IV SOLN
500.0000 [IU] | Freq: Once | INTRAVENOUS | Status: DC | PRN
  Filled 2020-07-07: qty 5

## 2020-07-07 MED ORDER — SODIUM CHLORIDE 0.9 % IV SOLN
10.0000 mg | Freq: Once | INTRAVENOUS | Status: AC
  Administered 2020-07-07: 10 mg via INTRAVENOUS
  Filled 2020-07-07: qty 1

## 2020-07-07 NOTE — Patient Instructions (Signed)
[facility] Discharge Instructions for Patients Receiving Chemotherapy  Today you received the following chemotherapy agents: Oxaliplatin, Leucovorin, and Fluorouracil  To help prevent nausea and vomiting after your treatment, we encourage you to take your nausea medication as directed by your MD.   If you develop nausea and vomiting that is not controlled by your nausea medication, call the clinic.   BELOW ARE SYMPTOMS THAT SHOULD BE REPORTED IMMEDIATELY:  *FEVER GREATER THAN 100.5 F  *CHILLS WITH OR WITHOUT FEVER  NAUSEA AND VOMITING THAT IS NOT CONTROLLED WITH YOUR NAUSEA MEDICATION  *UNUSUAL SHORTNESS OF BREATH  *UNUSUAL BRUISING OR BLEEDING  TENDERNESS IN MOUTH AND THROAT WITH OR WITHOUT PRESENCE OF ULCERS  *URINARY PROBLEMS  *BOWEL PROBLEMS  UNUSUAL RASH Items with * indicate a potential emergency and should be followed up as soon as possible.  Feel free to call the clinic should you have any questions or concerns. The clinic phone number is [phone].  Please show the CHEMO ALERT CARD at check-in to the Emergency Department and triage nurse.  [facility] Discharge Instructions for Patients receiving Home Portable Chemo Pump    **The bag should finish at 46 hours, 96 hours or 7 days. For example, if your pump is scheduled for 46 hours and it was put on at 4pm, it should finish at 2 pm the day it is scheduled to come off regardless of your appointment time.    Estimated time to finish   _________________________ (Have your nurse fill in)     ** if the display on your pump reads "Low Volume" and it is beeping, take the batteries out of the pump and come to the cancer center for it to be taken off.   **If the pump alarms go off prior to the pump reading "Low Volume" then call the [phone] and someone can assist you.  **If the plunger comes out and the bag fluid is running out, please use your chemo spill kit to clean up the  spill. Do not use paper towels or other house hold products.  ** If you have problems or questions regarding your pump, please call either the [phone] or the cancer center Monday-Friday 8:00am-4:30pm at [phone] and we will assist you.  If you are unable to get assistance then go to [facility] Emergency Room, ask the staff to contact the IV team for assistance.    

## 2020-07-07 NOTE — [place] Notes (Unsigned)
Critical INR 5.0 called to Raynelle Bring, RN at [place] by Prudencio Burly

## 2020-07-07 NOTE — [facility] Notes (Addendum)
[facility] OFFICE PROGRESS NOTE   Diagnosis: Colon cancer  INTERVAL HISTORY:   Sabrina [name] returns as scheduled.  She completed cycle 4 FOLFOX 06/23/2020.  She denies nausea/vomiting.  No mouth sores.  No diarrhea.  She estimates cold sensitivity with food and drink lasts about 4 to 5 days and with touch a little over a week.  No persistent neuropathy symptoms.  She denies bleeding.  Objective:  Vital signs in last 24 hours:  Blood pressure 131/62, pulse 68, temperature 97.7 F (36.5 C), temperature source Tympanic, resp. rate 14, height 5' 10"  (1.778 m), weight 155 lb 1.6 oz (70.4 kg), SpO2 100 %.    HEENT: No thrush or ulcers. Resp: Lungs clear bilaterally. Cardio: Regular rate and rhythm. GI: Abdomen soft and nontender.  No hepatosplenomegaly. Vascular: No leg edema. Skin: Palms without erythema. Port-A-Cath without erythema.   Lab Results:  Lab Results  Component Value Date   WBC 5.2 07/07/2020   HGB 10.8 (L) 07/07/2020   HCT 36.2 07/07/2020   MCV 79.6 (L) 07/07/2020   PLT 156 07/07/2020   NEUTROABS 3.4 07/07/2020    Imaging:  No results found.  Medications: I have reviewed the patient's current medications.  Assessment/Plan: 1. 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 Moderately differentiated adenocarcinoma, 9/30 lymph nodes positive, lymphovascular invasion present including large vessel extramural invasion, loss of MLH1 and PMS2, MLH1 hyper methylation present ? Incomplete colonoscopy secondary to stricturing at the sigmoid colon ? Virtual colonoscopy 03/13/2020-mass centered at the medial wall of the cecum and terminal ileum, adjacent ileocolic mesenteric adenopathy, nonspecific 3 mm right lower lobe nodule ? PET 26/94/8546 hypermetabolic cecal/terminal ileum mass with adjacent enlarged and hypermetabolic ileocolic lymph nodes, previously noted segment 7 liver lesion-not hypermetabolic-benign etiology  favored, dominant left lower lobe pulmonary nodule not hypermetabolic, other tiny nodules not hypermetabolic and below PET sensitivity ? Cycle 1 FOLFOX 05/12/2020 ? Cycle 2 FOLFOX 05/26/2020 ? Cycle 3 FOLFOX 06/09/2020 ? Cycle 4 FOLFOX 06/23/2020 ? Cycle 5 FOLFOX 07/07/2020 2. Iron deficiency anemia secondary to #1 3. Right breast cancer, stage Ia, T1b, N0, ER positive, PR positive, HER2 positive status post a right lumpectomy and adjuvant radiation/Taxol/Herceptin, anastrozole starting 01/25/2019 4. Diabetes 5. Atrial fibrillation-maintained on Coumadin 6. Hypothyroidism 7. Ongoing tobacco use 8. Family history of breast cancer-negative genetic testing   Disposition: Sabrina [name] appears stable.  She has completed 4 cycles of FOLFOX.  She continues to tolerate well.  Plan to proceed with cycle 5 today as scheduled.  We reviewed the CBC from today.  Counts adequate to proceed with treatment.  The INR is supratherapeutic.  She will hold Coumadin, repeat PT/INR on 07/09/2020.  She will contact the office with bleeding.  She will return for lab, follow-up with Dr. Lindi Adie and cycle 6 FOLFOX in 2 weeks.  She will contact the office in the interim as outlined above or with any other problems.  Patient seen with Dr. Benay Spice.    Ned Card ANP/GNP-BC   07/07/2020  11:31 AM  This was a shared visit with Ned Card.  Sabrina [name] will complete cycle 5 FOLFOX today.  She will then transition her care to Dr. Lindi Adie.  Coumadin was placed on hold today.  The PT/INR will be repeated when she returns on 07/09/2020.  She will need to continue close monitoring of the PT/INR while followed by Dr. Lindi Adie.  I was present for greater than 50% of today's visit.  I performed  medical decision making.  Sabrina Manson, MD

## 2020-07-07 NOTE — Telephone Encounter (Signed)
Notified of INR today 5.0 and patient was instructed to hold coumadin. We will recheck on 3/30 and call results again,

## 2020-07-08 ENCOUNTER — Ambulatory Visit (INDEPENDENT_AMBULATORY_CARE_PROVIDER_SITE_OTHER): Payer: Medicare Other | Admitting: Internal Medicine

## 2020-07-08 DIAGNOSIS — Z7901 Long term (current) use of anticoagulants: Secondary | ICD-10-CM

## 2020-07-08 DIAGNOSIS — Z5181 Encounter for therapeutic drug level monitoring: Secondary | ICD-10-CM

## 2020-07-08 DIAGNOSIS — I480 Paroxysmal atrial fibrillation: Secondary | ICD-10-CM

## 2020-07-09 ENCOUNTER — Other Ambulatory Visit: Payer: Self-pay

## 2020-07-09 ENCOUNTER — Inpatient Hospital Stay: Payer: Medicare Other

## 2020-07-09 VITALS — BP 122/80 | HR 57 | Temp 98.4°F | Resp 16

## 2020-07-09 DIAGNOSIS — C50411 Malignant neoplasm of upper-outer quadrant of right female breast: Secondary | ICD-10-CM

## 2020-07-09 DIAGNOSIS — C182 Malignant neoplasm of ascending colon: Secondary | ICD-10-CM

## 2020-07-09 DIAGNOSIS — I480 Paroxysmal atrial fibrillation: Secondary | ICD-10-CM

## 2020-07-09 DIAGNOSIS — Z5111 Encounter for antineoplastic chemotherapy: Secondary | ICD-10-CM | POA: Diagnosis not present

## 2020-07-09 LAB — PROTIME-INR
INR: 2.1 — ABNORMAL HIGH (ref 0.8–1.2)
Prothrombin Time: 22.4 seconds — ABNORMAL HIGH (ref 11.4–15.2)

## 2020-07-09 MED ORDER — HEPARIN SOD (PORK) LOCK FLUSH 100 UNIT/ML IV SOLN
500.0000 [IU] | Freq: Once | INTRAVENOUS | Status: AC | PRN
  Administered 2020-07-09: 500 [IU]
  Filled 2020-07-09: qty 5

## 2020-07-09 MED ORDER — SODIUM CHLORIDE 0.9% FLUSH
10.0000 mL | INTRAVENOUS | Status: DC | PRN
  Administered 2020-07-09: 10 mL
  Filled 2020-07-09: qty 10

## 2020-07-11 ENCOUNTER — Telehealth: Payer: Self-pay

## 2020-07-11 NOTE — Telephone Encounter (Signed)
-----   Message from Owens Shark, NP sent at 07/11/2020 11:16 AM EDT ----- Please instruct her to resume Coumadin at previous dose.  Follow-up as scheduled. ----I sent this 2 days ago. Cannot tell if patient was called,  no note in chart. Please call her to confirm she got the message

## 2020-07-11 NOTE — Telephone Encounter (Signed)
Spoke with pt updated her on New coumadin order pt can begin coumadin at previous dose f/u as scheduled

## 2020-07-16 ENCOUNTER — Telehealth: Payer: Self-pay

## 2020-07-16 NOTE — Telephone Encounter (Signed)
-----   Message from Owens Shark, NP sent at 07/16/2020  2:58 PM EDT ----- Was the PT/INR from 07/07/2020 forwarded to her coumadin clinic?

## 2020-07-16 NOTE — Telephone Encounter (Signed)
Last two INR results sent to cardiologists

## 2020-07-20 ENCOUNTER — Other Ambulatory Visit: Payer: Self-pay | Admitting: Oncology

## 2020-07-20 NOTE — Assessment & Plan Note (Signed)
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  Treatment plan: 1.Adj chemo with Taxol-Herceptin weekly X 12completed 7/23/2020and then q 3 weeks Herceptin.Completed 07/11/2019 2.Adjuvant radiation therapystarted 12/05/2018-01/02/19 3.Followed by adjuvant antiestrogen therapywith Anastrozole in 01/2019 ----------------------------------------------------------------------------------------------------------------------------------------------- Anastrozole on hold since Colon cancer chemo was started Jan 2022 (colon cancer was diagnosed due to work up for Iron deficiency anemia) Current treatment: FOLFOX adj chemo

## 2020-07-20 NOTE — [facility] Notes (Signed)
Patient Care Team: Patient, No Pcp Per (Inactive) as PCP - General (General Practice) Sabrina Liter, [name] as PCP - Cardiology (Cardiology) Sabrina Luna, [name] as Consulting Physician (General Surgery) Sabrina Lose, [name] as Consulting Physician (Hematology and Oncology) Sabrina Gibson, [name] as Attending Physician (Radiation Oncology) Sabrina Liter, [name] as Consulting Physician (Cardiology) Sabrina Finner, [name] as Oncology Nurse Navigator Sabrina Pier, [name] as Consulting Physician (Oncology)  DIAGNOSIS:    ICD-10-CM   1. Malignant neoplasm of upper-outer quadrant of right breast in female, estrogen receptor positive (Sabrina [name])  C50.411    Z17.0   2. 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[place])  C18.2     SUMMARY OF ONCOLOGIC HISTORY: Oncology History  Malignant neoplasm of upper-outer quadrant of right breast in female, estrogen receptor positive ([place])  06/19/2018 Initial Diagnosis   Screening mammogram detected right breast mass at 9 o'clock position 3 cm from the nipple, ultrasound revealed 7 x 5 x 5 mm mass, axillary ultrasound normal-appearing lymph nodes, ultrasound biopsy: Grade 1 IDC ER 100%, PR 10%, Ki-67 10%, HER-2 3+ positive, T1b N0 stage Ia   06/28/2018 Cancer Staging   Staging form: Breast, AJCC 8th Edition - Clinical stage from 06/28/2018: Stage IA (cT1b, cN0, cM0, G1, ER+, PR+, HER2+) - Signed by Sabrina Lose, [name] on 06/28/2018   07/05/2018 Genetic Testing   Negative genetic testing on the common hereditary cancer panel.  The Hereditary Gene Panel offered by [facility] includes sequencing and/or deletion duplication testing of the following 48 genes: APC, ATM, AXIN2, BARD1, BMPR1A, BRCA1, BRCA2, BRIP1, CDH1, CDK4, CDKN2A (p14ARF), CDKN2A (p16INK4a), CHEK2, CTNNA1, DICER1, EPCAM (Deletion/duplication testing only), GREM1 (promoter region deletion/duplication testing only), KIT, MEN1, MLH1, MSH2, MSH3, MSH6, MUTYH, NBN, NF1, NHTL1, PALB2, PDGFRA, PMS2, POLD1, POLE, PTEN, RAD50,  RAD51C, RAD51D, RNF43, SDHB, SDHC, SDHD, SMAD4, SMARCA4. STK11, TP53, TSC1, TSC2, and VHL.  The following genes were evaluated for sequence changes only: SDHA and HOXB13 c.251G>A variant only. The report date is July 05, 2018.   07/13/2018 Surgery   Right lumpectomy (Sabrina [name]): IDC with DCIS, 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    08/17/2018 - 07/11/2019 Chemotherapy   Taxol Herceptin followed by Herceptin maintenance   12/05/2018 - 01/02/2019 Radiation Therapy   Adjuvant XRT   01/25/2019 -  Anti-estrogen oral therapy   Anastrozole 47m daily, plan 5-7 years   07/13/2019 Cancer Staging   Staging form: Breast, AJCC 8th Edition - Pathologic stage from 07/13/2019: Stage IA (pT1b, pN0, cM0, G2, ER+, PR+, HER2+) - Signed by CGardenia Phlegm [name] on 08/01/2019   05/12/2020 -  Chemotherapy    Patient is on Treatment Plan: COLORECTAL FOLFOX Q14D X 6 MONTHS      Colon cancer ([place]  03/13/2020 Initial Diagnosis   CT Colonoscopy: Large mass centered about the medial wall cecum and terminal ileum, favoring colonic versus less 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 Adjacent ileocolic mesenteric adenopathy, consistent with nodal metastasis. 3 mm right lower lobe pulmonary nodule   04/29/2020 Cancer Staging   Staging form: Colon and Rectum, AJCC 8th Edition - Clinical: Stage IIIC (cT3, cN2b, cM0) - Signed by SLadell Pier [name] on 04/29/2020   05/12/2020 -  Chemotherapy    Patient is on Treatment Plan: COLORECTAL FOLFOX Q14D X 6 MONTHS        CHIEF COMPLIANT: Follow-upofiron deficiency anemia, breast and colon cancer  INTERVAL HISTORY: DSeraya Jobstis a 70y.o. with above-mentioned history of right breast  cancer who underwent a lumpectomy, adjuvant chemotherapy,radiation,Herceptin maintenance,andis currently onanti-estrogen therapywith anastrozole.She also has a history of severe iron deficiency anemia, and colon cancer for which she underwent surgery  and is followed by Dr. Benay Spice. Mammogram on 07/04/20 showed no evidence of malignancy bilaterally. She presents to the clinic todayforfollow-up.   ALLERGIES:  is allergic to atorvastatin and meperidine.  MEDICATIONS:  Current Outpatient Medications  Medication Sig Dispense Refill  . anastrozole (ARIMIDEX) 1 MG tablet TAKE 1 TABLET DAILY 90 tablet 3  . calcium carbonate (OSCAL) 1500 (600 Ca) MG TABS tablet Take 600 mg of elemental calcium by mouth 2 (two) times daily with a meal.    . digoxin (LANOXIN) 0.125 MG tablet Take 1 tablet (0.125 mg total) by mouth daily. 90 tablet 1  . escitalopram (LEXAPRO) 10 MG tablet Take 10 mg by mouth daily.    . flecainide (TAMBOCOR) 100 MG tablet TAKE 1 TABLET TWICE A DAY 60 tablet 0  . ibuprofen (ADVIL) 800 MG tablet Take 1 tablet (800 mg total) by mouth every 8 (eight) hours as needed. 30 tablet 0  . levothyroxine (SYNTHROID, LEVOTHROID) 100 MCG tablet Take 1 tablet by mouth daily.    Marland Kitchen lidocaine-prilocaine (EMLA) cream Apply 1 application topically as needed. Apply to [facility] ~ 1 hour prior to access 30 g 0  . lisinopril (PRINIVIL,ZESTRIL) 10 MG tablet Take 1 tablet (10 mg total) by mouth daily. 90 tablet 3  . metFORMIN (GLUCOPHAGE) 500 MG tablet Take 1 tablet by mouth 2 (two) times daily with a meal.     . metoprolol succinate (TOPROL-XL) 50 MG 24 hr tablet TAKE 1 TABLET DAILY (Patient taking differently: Take by mouth daily.) 90 tablet 3  . prochlorperazine (COMPAZINE) 10 MG tablet Take 1 tablet (10 mg total) by mouth every 6 (six) hours as needed for nausea or vomiting. 30 tablet 1  . warfarin (COUMADIN) 10 MG tablet TAKE ONE-HALF (1/2) TO ONE TABLET AS DIRECTED BY COUMADIN CLINIC 90 tablet 3   No current facility-administered medications for this visit.    PHYSICAL EXAMINATION: ECOG PERFORMANCE STATUS: 1 - Symptomatic but completely ambulatory  Vitals:   07/21/20 1030  BP: (!) 151/72  Pulse: 63  Resp: 18  Temp: (!) 97.5 F (36.4 C)  SpO2:  97%   Filed Weights   07/21/20 1030  Weight: 155 lb (70.3 kg)    BREAST: No palpable masses or nodules in either right or left breasts. No palpable axillary supraclavicular or infraclavicular adenopathy no breast tenderness or nipple discharge. (exam performed in the presence of a chaperone)  LABORATORY DATA:  I have reviewed the data as listed CMP Latest Ref Rng & Units 07/21/2020 07/07/2020 06/23/2020  Glucose 70 - 99 mg/dL 109(H) 112(H) 137(H)  BUN 8 - 23 mg/dL _0 Creatinine 0.44 - 1.00 mg/dL 0.62 0.64 0.66  Sodium 135 - 145 mmol/L 141 140 140  Potassium 3.5 - 5.1 mmol/L 3.9 4.0 4.2  Chloride 98 - 111 mmol/L 103 100 105  CO2 22 - 32 mmol/L _1 Calcium 8.9 - 10.3 mg/dL 9.6 9.3 9.3  Total Protein 6.5 - 8.1 g/dL 7.2 7.3 7.0  Total Bilirubin 0.3 - 1.2 mg/dL 0.3 0.2(L) 0.2(L)  Alkaline Phos 38 - 126 U/L 105 111 96  AST 15 - 41 U/L _2 ALT 0 - 44 U/L _3 Lab Results  Component Value Date   WBC 5.5 07/21/2020   HGB 10.9 (L)  07/21/2020   HCT 36.5 07/21/2020   MCV 81.3 07/21/2020   PLT 111 (L) 07/21/2020   NEUTROABS 3.6 07/21/2020    ASSESSMENT & PLAN:  Malignant neoplasm of upper-outer quadrant of right breast in female, estrogen receptor positive ([place]) 07/13/2018:Right lumpectomy (Sabrina [name]): IDC with DCIS, 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  Treatment plan: 1.Adj chemo with Taxol-Herceptin weekly X 12completed 7/23/2020and then q 3 weeks Herceptin.Completed 07/11/2019 2.Adjuvant radiation therapystarted 12/05/2018-01/02/19 3.Followed by adjuvant antiestrogen therapywith Anastrozole in 01/2019 ----------------------------------------------------------------------------------------------------------------------------------------------- Anastrozole on hold since Colon cancer chemo was started Jan 2022 (colon cancer was diagnosed due to work up for Iron deficiency anemia) Current treatment:  FOLFOX adj chemo    Colon cancer ([place]) 1. 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 Moderately differentiated adenocarcinoma, 9/30 lymph nodes positive, lymphovascular invasion present including large vessel extramural invasion, loss of MLH1 and PMS2, MLH1 hyper methylation present ? Incomplete colonoscopy secondary to stricturing at the sigmoid colon ? Virtual colonoscopy 03/13/2020-mass centered at the medial wall of the cecum and terminal ileum, adjacent ileocolic mesenteric adenopathy, nonspecific 3 mm right lower lobe nodule ? PET 29/79/8921 hypermetabolic cecal/terminal ileum mass with adjacent enlarged and hypermetabolic ileocolic lymph nodes, previously noted segment 7 liver lesion-not hypermetabolic-benign etiology favored, dominant left lower lobe pulmonary nodule not hypermetabolic, other tiny nodules not hypermetabolic and below PET sensitivity ? Cycle 1 FOLFOX 05/12/2020 ? Cycle 2 FOLFOX 05/26/2020 ? Cycle 3 FOLFOX 06/09/2020 ? Cycle 4 FOLFOX 06/23/2020 ? Cycle 5 FOLFOX 07/07/2020 ? Cycle 6 FOLFOX 07/21/2020  2. Iron deficiency anemia secondary to #1 3. Diabetes 4. Atrial fibrillation-maintained on Coumadin: Today's INR is 4.  Instructed to hold off on Coumadin.   Coumadin clinic is managing her.  She will be in touch with them to discuss dosing. 5. Hypothyroidism 6. Ongoing tobacco use Return to clinic every 2 weeks for FOLFOX. I will take over for her colon cancer chemotherapy since Dr. Benay Spice has moved to [employer].    No orders of the defined types were placed in this encounter.  The patient has a good understanding of the overall plan. she agrees with it. she will call with any problems that may develop before the next visit here.  Total time spent: 20 mins including face to face time and time spent for planning, charting and coordination of care  Rulon Eisenmenger, [name], MPH 07/21/2020  I, Molly Dorshimer, am acting as  scribe for Dr. Nicholas [name].  I have reviewed the above documentation for accuracy and completeness, and I agree with the above.

## 2020-07-21 ENCOUNTER — Inpatient Hospital Stay: Payer: Medicare Other

## 2020-07-21 ENCOUNTER — Inpatient Hospital Stay: Payer: Medicare Other | Attending: Hematology and Oncology

## 2020-07-21 ENCOUNTER — Inpatient Hospital Stay (HOSPITAL_BASED_OUTPATIENT_CLINIC_OR_DEPARTMENT_OTHER): Payer: Medicare Other | Admitting: Hematology and Oncology

## 2020-07-21 ENCOUNTER — Other Ambulatory Visit: Payer: Self-pay

## 2020-07-21 DIAGNOSIS — I4891 Unspecified atrial fibrillation: Secondary | ICD-10-CM | POA: Diagnosis not present

## 2020-07-21 DIAGNOSIS — C182 Malignant neoplasm of ascending colon: Secondary | ICD-10-CM

## 2020-07-21 DIAGNOSIS — Z9221 Personal history of antineoplastic chemotherapy: Secondary | ICD-10-CM | POA: Diagnosis not present

## 2020-07-21 DIAGNOSIS — D508 Other iron deficiency anemias: Secondary | ICD-10-CM | POA: Insufficient documentation

## 2020-07-21 DIAGNOSIS — Z7901 Long term (current) use of anticoagulants: Secondary | ICD-10-CM | POA: Insufficient documentation

## 2020-07-21 DIAGNOSIS — Z7984 Long term (current) use of oral hypoglycemic drugs: Secondary | ICD-10-CM | POA: Insufficient documentation

## 2020-07-21 DIAGNOSIS — Z923 Personal history of irradiation: Secondary | ICD-10-CM | POA: Insufficient documentation

## 2020-07-21 DIAGNOSIS — Z79811 Long term (current) use of aromatase inhibitors: Secondary | ICD-10-CM | POA: Insufficient documentation

## 2020-07-21 DIAGNOSIS — F17210 Nicotine dependence, cigarettes, uncomplicated: Secondary | ICD-10-CM | POA: Diagnosis not present

## 2020-07-21 DIAGNOSIS — Z170 Estrogen receptor positive status [ER+]: Secondary | ICD-10-CM

## 2020-07-21 DIAGNOSIS — E119 Type 2 diabetes mellitus without complications: Secondary | ICD-10-CM | POA: Diagnosis not present

## 2020-07-21 DIAGNOSIS — Z5111 Encounter for antineoplastic chemotherapy: Secondary | ICD-10-CM | POA: Diagnosis present

## 2020-07-21 DIAGNOSIS — C50411 Malignant neoplasm of upper-outer quadrant of right female breast: Secondary | ICD-10-CM

## 2020-07-21 DIAGNOSIS — I480 Paroxysmal atrial fibrillation: Secondary | ICD-10-CM

## 2020-07-21 DIAGNOSIS — E039 Hypothyroidism, unspecified: Secondary | ICD-10-CM | POA: Diagnosis not present

## 2020-07-21 DIAGNOSIS — Z79899 Other long term (current) drug therapy: Secondary | ICD-10-CM | POA: Diagnosis not present

## 2020-07-21 LAB — CMP (CANCER CENTER ONLY)
ALT: 16 U/L (ref 0–44)
AST: 27 U/L (ref 15–41)
Albumin: 3.9 g/dL (ref 3.5–5.0)
Alkaline Phosphatase: 105 U/L (ref 38–126)
Anion gap: 10 (ref 5–15)
BUN: 9 mg/dL (ref 8–23)
CO2: 28 mmol/L (ref 22–32)
Calcium: 9.6 mg/dL (ref 8.9–10.3)
Chloride: 103 mmol/L (ref 98–111)
Creatinine: 0.62 mg/dL (ref 0.44–1.00)
GFR, Estimated: >60 mL/min (ref >60)
Glucose, Bld: 109 mg/dL — ABNORMAL HIGH (ref 70–99)
Potassium: 3.9 mmol/L (ref 3.5–5.1)
Sodium: 141 mmol/L (ref 135–145)
Total Bilirubin: 0.3 mg/dL (ref 0.3–1.2)
Total Protein: 7.2 g/dL (ref 6.5–8.1)

## 2020-07-21 LAB — CBC WITH DIFFERENTIAL (CANCER CENTER ONLY)
Abs Immature Granulocytes: 0.02 10*3/uL (ref 0.00–0.07)
Basophils Absolute: 0 10*3/uL (ref 0.0–0.1)
Basophils Relative: 1 %
Eosinophils Absolute: 0 10*3/uL (ref 0.0–0.5)
Eosinophils Relative: 0 %
HCT: 36.5 % (ref 36.0–46.0)
Hemoglobin: 10.9 g/dL — ABNORMAL LOW (ref 12.0–15.0)
Immature Granulocytes: 0 %
Lymphocytes Relative: 19 %
Lymphs Abs: 1 10*3/uL (ref 0.7–4.0)
MCH: 24.3 pg — ABNORMAL LOW (ref 26.0–34.0)
MCHC: 29.9 g/dL — ABNORMAL LOW (ref 30.0–36.0)
MCV: 81.3 fL (ref 80.0–100.0)
Monocytes Absolute: 0.7 10*3/uL (ref 0.1–1.0)
Monocytes Relative: 14 %
Neutro Abs: 3.6 10*3/uL (ref 1.7–7.7)
Neutrophils Relative %: 66 %
Platelet Count: 111 10*3/uL — ABNORMAL LOW (ref 150–400)
RBC: 4.49 MIL/uL (ref 3.87–5.11)
RDW: 25.7 % — ABNORMAL HIGH (ref 11.5–15.5)
WBC Count: 5.5 10*3/uL (ref 4.0–10.5)
nRBC: 0 % (ref 0.0–0.2)

## 2020-07-21 LAB — PROTIME-INR
INR: 4 — ABNORMAL HIGH (ref 0.8–1.2)
Prothrombin Time: 37.4 seconds — ABNORMAL HIGH (ref 11.4–15.2)

## 2020-07-21 MED ORDER — LEUCOVORIN CALCIUM INJECTION 350 MG
400.0000 mg/m2 | Freq: Once | INTRAVENOUS | Status: AC
  Administered 2020-07-21: 740 mg via INTRAVENOUS
  Filled 2020-07-21: qty 37

## 2020-07-21 MED ORDER — PALONOSETRON HCL INJECTION 0.25 MG/5ML
INTRAVENOUS | Status: AC
  Filled 2020-07-21: qty 5

## 2020-07-21 MED ORDER — DEXTROSE 5 % IV SOLN
Freq: Once | INTRAVENOUS | Status: AC
  Filled 2020-07-21: qty 250

## 2020-07-21 MED ORDER — FLUOROURACIL CHEMO INJECTION 2.5 GM/50ML
400.0000 mg/m2 | Freq: Once | INTRAVENOUS | Status: AC
  Administered 2020-07-21: 750 mg via INTRAVENOUS
  Filled 2020-07-21: qty 15

## 2020-07-21 MED ORDER — SODIUM CHLORIDE 0.9% FLUSH
10.0000 mL | INTRAVENOUS | Status: DC | PRN
  Administered 2020-07-21: 10 mL
  Filled 2020-07-21: qty 10

## 2020-07-21 MED ORDER — SODIUM CHLORIDE 0.9 % IV SOLN
2400.0000 mg/m2 | INTRAVENOUS | Status: DC
  Administered 2020-07-21: 4450 mg via INTRAVENOUS
  Filled 2020-07-21: qty 89

## 2020-07-21 MED ORDER — OXALIPLATIN CHEMO INJECTION 100 MG/20ML
85.0000 mg/m2 | Freq: Once | INTRAVENOUS | Status: AC
  Administered 2020-07-21: 155 mg via INTRAVENOUS
  Filled 2020-07-21: qty 31

## 2020-07-21 MED ORDER — SODIUM CHLORIDE 0.9 % IV SOLN
10.0000 mg | Freq: Once | INTRAVENOUS | Status: AC
  Administered 2020-07-21: 10 mg via INTRAVENOUS
  Filled 2020-07-21: qty 10

## 2020-07-21 MED ORDER — PALONOSETRON HCL INJECTION 0.25 MG/5ML
0.2500 mg | Freq: Once | INTRAVENOUS | Status: AC
  Administered 2020-07-21: 0.25 mg via INTRAVENOUS

## 2020-07-21 NOTE — Assessment & Plan Note (Signed)
1. 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 Moderately differentiated adenocarcinoma, 9/30 lymph nodes positive, lymphovascular invasion present including large vessel extramural invasion, loss of MLH1 and PMS2, MLH1 hyper methylation present ? Incomplete colonoscopy secondary to stricturing at the sigmoid colon ? Virtual colonoscopy 03/13/2020-mass centered at the medial wall of the cecum and terminal ileum, adjacent ileocolic mesenteric adenopathy, nonspecific 3 mm right lower lobe nodule ? PET 12/87/8676 hypermetabolic cecal/terminal ileum mass with adjacent enlarged and hypermetabolic ileocolic lymph nodes, previously noted segment 7 liver lesion-not hypermetabolic-benign etiology favored, dominant left lower lobe pulmonary nodule not hypermetabolic, other tiny nodules not hypermetabolic and below PET sensitivity ? Cycle 1 FOLFOX 05/12/2020 ? Cycle 2 FOLFOX 05/26/2020 ? Cycle 3 FOLFOX 06/09/2020 ? Cycle 4 FOLFOX 06/23/2020 ? Cycle 5 FOLFOX 07/07/2020 ? Cycle 6 FOLFOX 07/21/2020  2. Iron deficiency anemia secondary to #1 3. Diabetes 4. Atrial fibrillation-maintained on Coumadin: Today's INR is 4.  Instructed to hold off on Coumadin.   Coumadin clinic is managing her.  She will be in touch with them to discuss dosing. 5. Hypothyroidism 6. Ongoing tobacco use Return to clinic every 2 weeks for FOLFOX. I will take over for her colon cancer chemotherapy since Dr. Benay Spice has moved to [employer].

## 2020-07-21 NOTE — Addendum Note (Signed)
Addended by: Nicholas Lose on: 07/21/2020 11:04 AM   Modules accepted: Orders

## 2020-07-21 NOTE — Patient Instructions (Signed)
[facility] Discharge Instructions for Patients Receiving Chemotherapy  Today you received the following chemotherapy agents: Oxaliplatin, Leucovorin, and Fluorouracil  To help prevent nausea and vomiting after your treatment, we encourage you to take your nausea medication as directed by your MD.   If you develop nausea and vomiting that is not controlled by your nausea medication, call the clinic.   BELOW ARE SYMPTOMS THAT SHOULD BE REPORTED IMMEDIATELY:  *FEVER GREATER THAN 100.5 F  *CHILLS WITH OR WITHOUT FEVER  NAUSEA AND VOMITING THAT IS NOT CONTROLLED WITH YOUR NAUSEA MEDICATION  *UNUSUAL SHORTNESS OF BREATH  *UNUSUAL BRUISING OR BLEEDING  TENDERNESS IN MOUTH AND THROAT WITH OR WITHOUT PRESENCE OF ULCERS  *URINARY PROBLEMS  *BOWEL PROBLEMS  UNUSUAL RASH Items with * indicate a potential emergency and should be followed up as soon as possible.  Feel free to call the clinic should you have any questions or concerns. The clinic phone number is [phone].  Please show the [name] at check-in to the Emergency Department and triage nurse.

## 2020-07-22 ENCOUNTER — Telehealth: Payer: Self-pay | Admitting: Cardiology

## 2020-07-22 ENCOUNTER — Ambulatory Visit (INDEPENDENT_AMBULATORY_CARE_PROVIDER_SITE_OTHER): Payer: Medicare Other | Admitting: Cardiology

## 2020-07-22 DIAGNOSIS — Z5181 Encounter for therapeutic drug level monitoring: Secondary | ICD-10-CM | POA: Diagnosis not present

## 2020-07-22 DIAGNOSIS — Z7901 Long term (current) use of anticoagulants: Secondary | ICD-10-CM

## 2020-07-22 DIAGNOSIS — I480 Paroxysmal atrial fibrillation: Secondary | ICD-10-CM | POA: Diagnosis not present

## 2020-07-22 NOTE — Telephone Encounter (Signed)
Patient has recent coumadin results in her chart from yesterday, she is requesting to speak to Frederik Schmidt, RN to be informed on how to take her coumadin will forward to him.

## 2020-07-22 NOTE — Telephone Encounter (Incomplete)
A full discussion of the nature of anticoagulants has been carried out.  A benefit risk analysis has been presented to the patient, so that they understand the justification for choosing anticoagulation at this time. The need for frequent and regular monitoring, precise dosage adjustment and compliance is stressed.  Side effects of potential bleeding are discussed.  The patient should avoid any OTC items containing aspirin or ibuprofen, and should avoid great swings in general diet.  Avoid alcohol consumption.  Call if any signs of abnormal bleeding.  Next PT/INR test in ***; telephone follow up thereafter.  Pt is requesting a callback for INR results from 07/21/2000

## 2020-07-23 ENCOUNTER — Other Ambulatory Visit: Payer: Self-pay

## 2020-07-23 ENCOUNTER — Telehealth: Payer: Self-pay | Admitting: Hematology and Oncology

## 2020-07-23 ENCOUNTER — Inpatient Hospital Stay: Payer: Medicare Other

## 2020-07-23 VITALS — BP 131/69 | HR 66 | Temp 98.3°F | Resp 16

## 2020-07-23 DIAGNOSIS — Z170 Estrogen receptor positive status [ER+]: Secondary | ICD-10-CM

## 2020-07-23 DIAGNOSIS — C50411 Malignant neoplasm of upper-outer quadrant of right female breast: Secondary | ICD-10-CM

## 2020-07-23 DIAGNOSIS — C182 Malignant neoplasm of ascending colon: Secondary | ICD-10-CM

## 2020-07-23 DIAGNOSIS — Z5111 Encounter for antineoplastic chemotherapy: Secondary | ICD-10-CM | POA: Diagnosis not present

## 2020-07-23 MED ORDER — HEPARIN SOD (PORK) LOCK FLUSH 100 UNIT/ML IV SOLN
500.0000 [IU] | Freq: Once | INTRAVENOUS | Status: AC | PRN
  Administered 2020-07-23: 500 [IU]
  Filled 2020-07-23: qty 5

## 2020-07-23 MED ORDER — SODIUM CHLORIDE 0.9% FLUSH
10.0000 mL | INTRAVENOUS | Status: DC | PRN
  Administered 2020-07-23: 10 mL
  Filled 2020-07-23: qty 10

## 2020-07-23 NOTE — Telephone Encounter (Signed)
Scheduled per 4/11 los. Called pt and left a msg

## 2020-08-01 ENCOUNTER — Other Ambulatory Visit: Payer: Self-pay | Admitting: *Deleted

## 2020-08-01 DIAGNOSIS — C182 Malignant neoplasm of ascending colon: Secondary | ICD-10-CM

## 2020-08-01 DIAGNOSIS — I480 Paroxysmal atrial fibrillation: Secondary | ICD-10-CM

## 2020-08-03 NOTE — Assessment & Plan Note (Signed)
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  Treatment plan: 1.Adj chemo with Taxol-Herceptin weekly X 12completed 7/23/2020and then q 3 weeks Herceptin.Completed 07/11/2019 2.Adjuvant radiation therapystarted 12/05/2018-01/02/19 3.Followed by adjuvant antiestrogen therapywith Anastrozole in 01/2019 ----------------------------------------------------------------------------------------------------------------------------------------------- Anastrozole on hold since Colon cancer chemo was started Jan 2022 (colon cancer was diagnosed due to work up for Iron deficiency anemia) Current treatment: FOLFOX adj chemo

## 2020-08-03 NOTE — [facility] Notes (Signed)
Patient Care Team: Patient, No Pcp Per (Inactive) as PCP - General (General Practice) Park Liter, MD as PCP - Cardiology (Cardiology) Erroll Luna, MD as Consulting Physician (General Surgery) Nicholas Lose, MD as Consulting Physician (Hematology and Oncology) Eppie Gibson, MD as Attending Physician (Radiation Oncology) Park Liter, MD as Consulting Physician (Cardiology) Jonnie Finner, RN as Oncology Nurse Navigator Ladell Pier, MD as Consulting Physician (Oncology)  DIAGNOSIS:    ICD-10-CM   1. Malignant neoplasm of upper-outer quadrant of right breast in female, estrogen receptor positive ([place])  C50.411    Z17.0   2. 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[place])  C18.2     SUMMARY OF ONCOLOGIC HISTORY: Oncology History  Malignant neoplasm of upper-outer quadrant of right breast in female, estrogen receptor positive ([place])  06/19/2018 Initial Diagnosis   Screening mammogram detected right breast mass at 9 o'clock position 3 cm from the nipple, ultrasound revealed 7 x 5 x 5 mm mass, axillary ultrasound normal-appearing lymph nodes, ultrasound biopsy: Grade 1 IDC ER 100%, PR 10%, Ki-67 10%, HER-2 3+ positive, T1b N0 stage Ia   06/28/2018 Cancer Staging   Staging form: Breast, AJCC 8th Edition - Clinical stage from 06/28/2018: Stage IA (cT1b, cN0, cM0, G1, ER+, PR+, HER2+) - Signed by Nicholas Lose, MD on 06/28/2018   07/05/2018 Genetic Testing   Negative genetic testing on the common hereditary cancer panel.  The Hereditary Gene Panel offered by [facility] includes sequencing and/or deletion duplication testing of the following 48 genes: APC, ATM, AXIN2, BARD1, BMPR1A, BRCA1, BRCA2, BRIP1, CDH1, CDK4, CDKN2A (p14ARF), CDKN2A (p16INK4a), CHEK2, CTNNA1, DICER1, EPCAM (Deletion/duplication testing only), GREM1 (promoter region deletion/duplication testing only), KIT, MEN1, MLH1, MSH2, MSH3, MSH6, MUTYH, NBN, NF1, NHTL1, PALB2, PDGFRA, PMS2, POLD1, POLE, PTEN, RAD50,  RAD51C, RAD51D, RNF43, SDHB, SDHC, SDHD, SMAD4, SMARCA4. STK11, TP53, TSC1, TSC2, and VHL.  The following genes were evaluated for sequence changes only: SDHA and HOXB13 c.251G>A variant only. The report date is July 05, 2018.   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    08/17/2018 - 07/11/2019 Chemotherapy   Taxol Herceptin followed by Herceptin maintenance   12/05/2018 - 01/02/2019 Radiation Therapy   Adjuvant XRT   01/25/2019 -  Anti-estrogen oral therapy   Anastrozole 19m daily, plan 5-7 years   07/13/2019 Cancer Staging   Staging form: Breast, AJCC 8th Edition - Pathologic stage from 07/13/2019: Stage IA (pT1b, pN0, cM0, G2, ER+, PR+, HER2+) - Signed by CGardenia Phlegm NP on 08/01/2019   05/12/2020 -  Chemotherapy    Patient is on Treatment Plan: COLORECTAL FOLFOX Q14D X 6 MONTHS      [place]  03/13/2020 Initial Diagnosis   CT Colonoscopy: Large mass centered about the medial wall cecum and terminal ileum, favoring colonic versus less 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 Adjacent ileocolic mesenteric adenopathy, consistent with nodal metastasis. 3 mm right lower lobe pulmonary nodule   04/29/2020 Cancer Staging   Staging form: Colon and Rectum, AJCC 8th Edition - Clinical: Stage IIIC (cT3, cN2b, cM0) - Signed by SLadell Pier MD on 04/29/2020   05/12/2020 -  Chemotherapy    Patient is on Treatment Plan: COLORECTAL FOLFOX Q14D X 6 MONTHS        CHIEF COMPLIANT: Cycle 7 Folfox  INTERVAL HISTORY: DNaviyah [name] a 70y.o. with above-mentioned history of right breast cancer who underwent a  lumpectomy, adjuvant chemotherapy,radiation,Herceptin maintenance,andis currently onanti-estrogen therapywith anastrozole.She also has a history of severe iron deficiency anemia, and colon cancer for which she underwent surgery and is currently on chemotherapy with Folfox.  She presents to the clinic todayfortreatment.  She is tolerating treatment extremely well.  She has cold sensitivity that lasts for more numberthan before.  She denies neuropathy.  ALLERGIES:  is allergic to atorvastatin and meperidine.  MEDICATIONS:  Current Outpatient Medications  Medication Sig Dispense Refill  . anastrozole (ARIMIDEX) 1 MG tablet TAKE 1 TABLET DAILY 90 tablet 3  . calcium carbonate (OSCAL) 1500 (600 Ca) MG TABS tablet Take 600 mg of elemental calcium by mouth 2 (two) times daily with a meal.    . digoxin (LANOXIN) 0.125 MG tablet Take 1 tablet (0.125 mg total) by mouth daily. 90 tablet 1  . escitalopram (LEXAPRO) 10 MG tablet Take 10 mg by mouth daily.    . flecainide (TAMBOCOR) 100 MG tablet TAKE 1 TABLET TWICE A DAY 60 tablet 0  . ibuprofen (ADVIL) 800 MG tablet Take 1 tablet (800 mg total) by mouth every 8 (eight) hours as needed. 30 tablet 0  . levothyroxine (SYNTHROID, LEVOTHROID) 100 MCG tablet Take 1 tablet by mouth daily.    Marland Kitchen lidocaine-prilocaine (EMLA) cream Apply 1 application topically as needed. Apply to [facility] ~ 1 hour prior to access 30 g 0  . lisinopril (PRINIVIL,ZESTRIL) 10 MG tablet Take 1 tablet (10 mg total) by mouth daily. 90 tablet 3  . metFORMIN (GLUCOPHAGE) 500 MG tablet Take 1 tablet by mouth 2 (two) times daily with a meal.     . metoprolol succinate (TOPROL-XL) 50 MG 24 hr tablet TAKE 1 TABLET DAILY (Patient taking differently: Take by mouth daily.) 90 tablet 3  . prochlorperazine (COMPAZINE) 10 MG tablet Take 1 tablet (10 mg total) by mouth every 6 (six) hours as needed for nausea or vomiting. 30 tablet 1  . warfarin (COUMADIN) 10 MG tablet TAKE ONE-HALF (1/2) TO ONE TABLET AS DIRECTED BY COUMADIN CLINIC 90 tablet 3   No current facility-administered medications for this visit.    PHYSICAL EXAMINATION: ECOG PERFORMANCE STATUS: 1 - Symptomatic but completely ambulatory  Vitals:   08/04/20 1022  BP: (!) 151/75  Pulse: 70  Resp: 16  Temp:  97.6 F (36.4 C)  SpO2: 99%   Filed Weights   08/04/20 1022  Weight: 154 lb 9.6 oz (70.1 kg)    LABORATORY DATA:  I have reviewed the data as listed CMP Latest Ref Rng & Units 07/21/2020 07/07/2020 06/23/2020  Glucose 70 - 99 mg/dL 109(H) 112(H) 137(H)  BUN 8 - 23 mg/dL 9 13 9   Creatinine 0.44 - 1.00 mg/dL 0.62 0.64 0.66  Sodium 135 - 145 mmol/L 141 140 140  Potassium 3.5 - 5.1 mmol/L 3.9 4.0 4.2  Chloride 98 - 111 mmol/L 103 100 105  CO2 22 - 32 mmol/L 28 29 28   Calcium 8.9 - 10.3 mg/dL 9.6 9.3 9.3  Total Protein 6.5 - 8.1 g/dL 7.2 7.3 7.0  Total Bilirubin 0.3 - 1.2 mg/dL 0.3 0.2(L) 0.2(L)  Alkaline Phos 38 - 126 U/L 105 111 96  AST 15 - 41 U/L 27 25 21   ALT 0 - 44 U/L 16 17 13     Lab Results  Component Value Date   WBC 4.2 08/04/2020   HGB 11.3 (L) 08/04/2020   HCT 38.1 08/04/2020   MCV 84.5 08/04/2020   PLT 114 (L) 08/04/2020   NEUTROABS PENDING 08/04/2020  ASSESSMENT & PLAN:  Malignant neoplasm of upper-outer quadrant of right breast in female, estrogen receptor positive ([place]) /05/2018:Right lumpectomy ([facility]): IDC with DCIS, 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  Treatment plan: 1.Adj chemo with Taxol-Herceptin weekly X 12completed 7/23/2020and then q 3 weeks Herceptin.Completed 07/11/2019 2.Adjuvant radiation therapystarted 12/05/2018-01/02/19 3.Followed by adjuvant antiestrogen therapywith Anastrozole in 01/2019 ----------------------------------------------------------------------------------------------------------------------------------------------- Anastrozole on hold since Colon cancer chemo was started Jan 2022 (colon cancer was diagnosed due to work up for Iron deficiency anemia) Current treatment: FOLFOX adj chemo   Colon cancer ([place]) Colon cancer ([place]) 1. 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 Moderately differentiated adenocarcinoma, 9/30  lymph nodes positive, lymphovascular invasion present including large vessel extramural invasion, loss of MLH1 and PMS2, MLH1 hyper methylation present ? Incomplete colonoscopy secondary to stricturing at the sigmoid colon ? Virtual colonoscopy 03/13/2020-mass centered at the medial wall of the cecum and terminal ileum, adjacent ileocolic mesenteric adenopathy, nonspecific 3 mm right lower lobe nodule ? PET 63/78/5885 hypermetabolic cecal/terminal ileum mass with adjacent enlarged and hypermetabolic ileocolic lymph nodes, previously noted segment 7 liver lesion-not hypermetabolic-benign etiology favored, dominant left lower lobe pulmonary nodule not hypermetabolic, other tiny nodules not hypermetabolic and below PET sensitivity ? Cycle 1 FOLFOX 05/12/2020 Today is cycle 7  2. Iron deficiency anemia secondary to #1 3. Diabetes 4. Atrial fibrillation-maintained on Coumadin: Awaiting today's INR.    Coumadin clinic is managing her.  She will be in touch with them to discuss dosing. 5. Hypothyroidism 6. Ongoing tobacco use Return to clinic every 2 weeks for FOLFOX.    No orders of the defined types were placed in this encounter.  The patient has a good understanding of the overall plan. she agrees with it. she will call with any problems that may develop before the next visit here.  Total time spent: 30 mins including face to face time and time spent for planning, charting and coordination of care  Rulon Eisenmenger, MD, MPH 08/04/2020  I, Molly Dorshimer, am acting as scribe for Dr. Nicholas Lose.  I have reviewed the above documentation for accuracy and completeness, and I agree with the above.

## 2020-08-03 NOTE — Assessment & Plan Note (Signed)
Colon cancer ([employer]) 1. 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 Moderately differentiated adenocarcinoma, 9/30 lymph nodes positive, lymphovascular invasion present including large vessel extramural invasion, loss of MLH1 and PMS2, MLH1 hyper methylation present ? Incomplete colonoscopy secondary to stricturing at the sigmoid colon ? Virtual colonoscopy 03/13/2020-mass centered at the medial wall of the cecum and terminal ileum, adjacent ileocolic mesenteric adenopathy, nonspecific 3 mm right lower lobe nodule ? PET 75/17/0017 hypermetabolic cecal/terminal ileum mass with adjacent enlarged and hypermetabolic ileocolic lymph nodes, previously noted segment 7 liver lesion-not hypermetabolic-benign etiology favored, dominant left lower lobe pulmonary nodule not hypermetabolic, other tiny nodules not hypermetabolic and below PET sensitivity ? Cycle 1 FOLFOX 05/12/2020 Today is cycle 7  2. Iron deficiency anemia secondary to #1 3. Diabetes 4. Atrial fibrillation-maintained on Coumadin: Today's INR is 4.  Instructed to hold off on Coumadin.   Coumadin clinic is managing her.  She will be in touch with them to discuss dosing. 5. Hypothyroidism 6. Ongoing tobacco use Return to clinic every 2 weeks for FOLFOX.

## 2020-08-04 ENCOUNTER — Inpatient Hospital Stay: Payer: Medicare Other

## 2020-08-04 ENCOUNTER — Ambulatory Visit (INDEPENDENT_AMBULATORY_CARE_PROVIDER_SITE_OTHER): Payer: Medicare Other | Admitting: Cardiology

## 2020-08-04 ENCOUNTER — Other Ambulatory Visit: Payer: Self-pay

## 2020-08-04 ENCOUNTER — Inpatient Hospital Stay (HOSPITAL_BASED_OUTPATIENT_CLINIC_OR_DEPARTMENT_OTHER): Payer: Medicare Other | Admitting: Hematology and Oncology

## 2020-08-04 DIAGNOSIS — C182 Malignant neoplasm of ascending colon: Secondary | ICD-10-CM

## 2020-08-04 DIAGNOSIS — Z170 Estrogen receptor positive status [ER+]: Secondary | ICD-10-CM | POA: Diagnosis not present

## 2020-08-04 DIAGNOSIS — I480 Paroxysmal atrial fibrillation: Secondary | ICD-10-CM

## 2020-08-04 DIAGNOSIS — C50411 Malignant neoplasm of upper-outer quadrant of right female breast: Secondary | ICD-10-CM | POA: Diagnosis not present

## 2020-08-04 DIAGNOSIS — Z5111 Encounter for antineoplastic chemotherapy: Secondary | ICD-10-CM | POA: Diagnosis not present

## 2020-08-04 DIAGNOSIS — Z7901 Long term (current) use of anticoagulants: Secondary | ICD-10-CM

## 2020-08-04 DIAGNOSIS — Z5181 Encounter for therapeutic drug level monitoring: Secondary | ICD-10-CM

## 2020-08-04 LAB — CBC WITH DIFFERENTIAL (CANCER CENTER ONLY)
Abs Immature Granulocytes: 0.01 10*3/uL (ref 0.00–0.07)
Basophils Absolute: 0 10*3/uL (ref 0.0–0.1)
Basophils Relative: 1 %
Eosinophils Absolute: 0 10*3/uL (ref 0.0–0.5)
Eosinophils Relative: 1 %
HCT: 38.1 % (ref 36.0–46.0)
Hemoglobin: 11.3 g/dL — ABNORMAL LOW (ref 12.0–15.0)
Immature Granulocytes: 0 %
Lymphocytes Relative: 28 %
Lymphs Abs: 1.2 10*3/uL (ref 0.7–4.0)
MCH: 25.1 pg — ABNORMAL LOW (ref 26.0–34.0)
MCHC: 29.7 g/dL — ABNORMAL LOW (ref 30.0–36.0)
MCV: 84.5 fL (ref 80.0–100.0)
Monocytes Absolute: 0.6 10*3/uL (ref 0.1–1.0)
Monocytes Relative: 13 %
Neutro Abs: 2.4 10*3/uL (ref 1.7–7.7)
Neutrophils Relative %: 57 %
Platelet Count: 114 10*3/uL — ABNORMAL LOW (ref 150–400)
RBC: 4.51 MIL/uL (ref 3.87–5.11)
RDW: 25.4 % — ABNORMAL HIGH (ref 11.5–15.5)
WBC Count: 4.2 10*3/uL (ref 4.0–10.5)
nRBC: 0 % (ref 0.0–0.2)

## 2020-08-04 LAB — CMP (CANCER CENTER ONLY)
ALT: 12 U/L (ref 0–44)
AST: 23 U/L (ref 15–41)
Albumin: 3.7 g/dL (ref 3.5–5.0)
Alkaline Phosphatase: 115 U/L (ref 38–126)
Anion gap: 10 (ref 5–15)
BUN: 11 mg/dL (ref 8–23)
CO2: 29 mmol/L (ref 22–32)
Calcium: 9.9 mg/dL (ref 8.9–10.3)
Chloride: 103 mmol/L (ref 98–111)
Creatinine: 0.62 mg/dL (ref 0.44–1.00)
GFR, Estimated: >60 mL/min (ref >60)
Glucose, Bld: 118 mg/dL — ABNORMAL HIGH (ref 70–99)
Potassium: 4.3 mmol/L (ref 3.5–5.1)
Sodium: 142 mmol/L (ref 135–145)
Total Bilirubin: 0.3 mg/dL (ref 0.3–1.2)
Total Protein: 7.2 g/dL (ref 6.5–8.1)

## 2020-08-04 LAB — PROTIME-INR
INR: 3.4 — ABNORMAL HIGH (ref 0.8–1.2)
Prothrombin Time: 34.2 seconds — ABNORMAL HIGH (ref 11.4–15.2)

## 2020-08-04 MED ORDER — DEXTROSE 5 % IV SOLN
400.0000 mg/m2 | Freq: Once | INTRAVENOUS | Status: AC
  Administered 2020-08-04: 740 mg via INTRAVENOUS
  Filled 2020-08-04: qty 37

## 2020-08-04 MED ORDER — OXALIPLATIN CHEMO INJECTION 100 MG/20ML
85.0000 mg/m2 | Freq: Once | INTRAVENOUS | Status: AC
  Administered 2020-08-04: 155 mg via INTRAVENOUS
  Filled 2020-08-04: qty 31

## 2020-08-04 MED ORDER — FLUOROURACIL CHEMO INJECTION 2.5 GM/50ML
400.0000 mg/m2 | Freq: Once | INTRAVENOUS | Status: AC
  Administered 2020-08-04: 750 mg via INTRAVENOUS
  Filled 2020-08-04: qty 15

## 2020-08-04 MED ORDER — DEXTROSE 5 % IV SOLN
Freq: Once | INTRAVENOUS | Status: AC
  Filled 2020-08-04: qty 250

## 2020-08-04 MED ORDER — PALONOSETRON HCL INJECTION 0.25 MG/5ML
0.2500 mg | Freq: Once | INTRAVENOUS | Status: AC
  Administered 2020-08-04: 0.25 mg via INTRAVENOUS

## 2020-08-04 MED ORDER — PALONOSETRON HCL INJECTION 0.25 MG/5ML
INTRAVENOUS | Status: AC
  Filled 2020-08-04: qty 5

## 2020-08-04 MED ORDER — SODIUM CHLORIDE 0.9 % IV SOLN
2400.0000 mg/m2 | INTRAVENOUS | Status: DC
  Administered 2020-08-04: 4450 mg via INTRAVENOUS
  Filled 2020-08-04: qty 89

## 2020-08-04 MED ORDER — SODIUM CHLORIDE 0.9 % IV SOLN
10.0000 mg | Freq: Once | INTRAVENOUS | Status: AC
  Administered 2020-08-04: 10 mg via INTRAVENOUS
  Filled 2020-08-04: qty 10

## 2020-08-04 NOTE — Patient Instructions (Signed)
[facility]  Discharge Instructions: Thank you for choosing [facility] to provide your oncology and hematology care.   If you have a lab appointment with the [facility], please go directly to the [facility] and check in at the registration area.   Wear comfortable clothing and clothing appropriate for easy access to any Portacath or PICC line.   We strive to give you quality time with your provider. You may need to reschedule your appointment if you arrive late (15 or more minutes).  Arriving late affects you and other patients whose appointments are after yours.  Also, if you miss three or more appointments without notifying the office, you may be dismissed from the clinic at the provider's discretion.      For prescription refill requests, have your pharmacy contact our office and allow 72 hours for refills to be completed.    Today you received the following chemotherapy and/or immunotherapy agents: oxaliplatin/leucovorin/fluorouracil.      To help prevent nausea and vomiting after your treatment, we encourage you to take your nausea medication as directed.  BELOW ARE SYMPTOMS THAT SHOULD BE REPORTED IMMEDIATELY: . *FEVER GREATER THAN 100.4 F (38 C) OR HIGHER . *CHILLS OR SWEATING . *NAUSEA AND VOMITING THAT IS NOT CONTROLLED WITH YOUR NAUSEA MEDICATION . *UNUSUAL SHORTNESS OF BREATH . *UNUSUAL BRUISING OR BLEEDING . *URINARY PROBLEMS (pain or burning when urinating, or frequent urination) . *BOWEL PROBLEMS (unusual diarrhea, constipation, pain near the anus) . TENDERNESS IN MOUTH AND THROAT WITH OR WITHOUT PRESENCE OF ULCERS (sore throat, sores in mouth, or a toothache) . UNUSUAL RASH, SWELLING OR PAIN  . UNUSUAL VAGINAL DISCHARGE OR ITCHING   Items with * indicate a potential emergency and should be followed up as soon as possible or go to the Emergency Department if any problems should occur.  Please show the CHEMOTHERAPY ALERT  CARD or IMMUNOTHERAPY ALERT CARD at check-in to the Emergency Department and triage nurse.  Should you have questions after your visit or need to cancel or reschedule your appointment, please contact [facility]  Dept: [phone]  and follow the prompts.  Office hours are 8:00 a.m. to 4:30 p.m. Monday - Friday. Please note that voicemails left after 4:00 p.m. may not be returned until the following business day.  We are closed weekends and major holidays. You have access to a nurse at all times for urgent questions. Please call the main number to the clinic Dept: [phone] and follow the prompts.   For any non-urgent questions, you may also contact your provider using MyChart. We now offer e-Visits for anyone 12 and older to request care online for non-urgent symptoms. For details visit [email or web address].   Also download the MyChart app! Go to the app store, search "MyChart", open the app, select [facility], and log in with your MyChart username and password.  Due to Covid, a mask is required upon entering the hospital/clinic. If you do not have a mask, one will be given to you upon arrival. For doctor visits, patients may have 1 support person aged 72 or older with them. For treatment visits, patients cannot have anyone with them due to current Covid guidelines and our immunocompromised population.

## 2020-08-05 ENCOUNTER — Telehealth: Payer: Self-pay | Admitting: Hematology and Oncology

## 2020-08-05 NOTE — Telephone Encounter (Signed)
Scheduled per 4/25 los. Pt will receive an updated appt calendar per next visit appt notes

## 2020-08-06 ENCOUNTER — Other Ambulatory Visit: Payer: Self-pay

## 2020-08-06 ENCOUNTER — Inpatient Hospital Stay: Payer: Medicare Other

## 2020-08-06 MED ORDER — SODIUM CHLORIDE 0.9% FLUSH
3.0000 mL | INTRAVENOUS | Status: DC | PRN
  Filled 2020-08-06: qty 10

## 2020-08-06 MED ORDER — HEPARIN SOD (PORK) LOCK FLUSH 100 UNIT/ML IV SOLN
500.0000 [IU] | Freq: Once | INTRAVENOUS | Status: DC | PRN
  Filled 2020-08-06: qty 5

## 2020-08-06 MED ORDER — HEPARIN SOD (PORK) LOCK FLUSH 100 UNIT/ML IV SOLN
250.0000 [IU] | Freq: Once | INTRAVENOUS | Status: DC | PRN
  Filled 2020-08-06: qty 5

## 2020-08-06 MED ORDER — COLD PACK MISC ONCOLOGY
1.0000 | Freq: Once | Status: DC | PRN
  Filled 2020-08-06: qty 1

## 2020-08-06 MED ORDER — ALTEPLASE 2 MG IJ SOLR
2.0000 mg | Freq: Once | INTRAMUSCULAR | Status: DC | PRN
  Filled 2020-08-06: qty 2

## 2020-08-06 MED ORDER — SODIUM CHLORIDE 0.9% FLUSH
10.0000 mL | INTRAVENOUS | Status: DC | PRN
  Filled 2020-08-06: qty 10

## 2020-08-06 NOTE — Progress Notes (Signed)
Patient arrived today for her chemo therapy pump D/C. Upon looking her pump had been turned off and she still had 45 ml of chemo to receive. This Rn restarted her chemo pump and insured all clamps were unclamped and no issues. Patient verbalized understanding and will return tomorrow for pump removal

## 2020-08-07 ENCOUNTER — Inpatient Hospital Stay: Payer: Medicare Other

## 2020-08-07 ENCOUNTER — Other Ambulatory Visit: Payer: Self-pay

## 2020-08-07 VITALS — BP 142/79 | HR 67 | Temp 98.7°F | Resp 16

## 2020-08-07 DIAGNOSIS — C50411 Malignant neoplasm of upper-outer quadrant of right female breast: Secondary | ICD-10-CM

## 2020-08-07 DIAGNOSIS — Z5111 Encounter for antineoplastic chemotherapy: Secondary | ICD-10-CM | POA: Diagnosis not present

## 2020-08-07 DIAGNOSIS — D500 Iron deficiency anemia secondary to blood loss (chronic): Secondary | ICD-10-CM

## 2020-08-07 DIAGNOSIS — Z170 Estrogen receptor positive status [ER+]: Secondary | ICD-10-CM

## 2020-08-07 MED ORDER — HEPARIN SOD (PORK) LOCK FLUSH 100 UNIT/ML IV SOLN
500.0000 [IU] | Freq: Once | INTRAVENOUS | Status: AC | PRN
  Administered 2020-08-07: 500 [IU]
  Filled 2020-08-07: qty 5

## 2020-08-07 MED ORDER — SODIUM CHLORIDE 0.9% FLUSH
10.0000 mL | Freq: Once | INTRAVENOUS | Status: AC | PRN
  Administered 2020-08-07: 10 mL
  Filled 2020-08-07: qty 10

## 2020-08-07 NOTE — Patient Instructions (Signed)

## 2020-08-15 ENCOUNTER — Other Ambulatory Visit: Payer: Self-pay | Admitting: *Deleted

## 2020-08-15 DIAGNOSIS — I480 Paroxysmal atrial fibrillation: Secondary | ICD-10-CM

## 2020-08-15 DIAGNOSIS — Z170 Estrogen receptor positive status [ER+]: Secondary | ICD-10-CM

## 2020-08-17 NOTE — Progress Notes (Signed)
Patient Care Team: Patient, No Pcp Per (Inactive) as PCP - General (General Practice) Park Liter, MD as PCP - Cardiology (Cardiology) Erroll Luna, MD as Consulting Physician (General Surgery) Nicholas Lose, MD as Consulting Physician (Hematology and Oncology) Eppie Gibson, MD as Attending Physician (Radiation Oncology) Park Liter, MD as Consulting Physician (Cardiology) Jonnie Finner, RN as Oncology Nurse Navigator Ladell Pier, MD as Consulting Physician (Oncology)  DIAGNOSIS:    ICD-10-CM   1. Malignant neoplasm of upper-outer quadrant of right breast in female, estrogen receptor positive ([place])  C50.411    Z17.0     SUMMARY OF ONCOLOGIC HISTORY: Oncology History  Malignant neoplasm of upper-outer quadrant of right breast in female, estrogen receptor positive ([place])  06/19/2018 Initial Diagnosis   Screening mammogram detected right breast mass at 9 o'clock position 3 cm from the nipple, ultrasound revealed 7 x 5 x 5 mm mass, axillary ultrasound normal-appearing lymph nodes, ultrasound biopsy: Grade 1 IDC ER 100%, PR 10%, Ki-67 10%, HER-2 3+ positive, T1b N0 stage Ia   06/28/2018 Cancer Staging   Staging form: Breast, AJCC 8th Edition - Clinical stage from 06/28/2018: Stage IA (cT1b, cN0, cM0, G1, ER+, PR+, HER2+) - Signed by Nicholas Lose, MD on 06/28/2018   07/05/2018 Genetic Testing   Negative genetic testing on the common hereditary cancer panel.  The Hereditary Gene Panel offered by [facility] includes sequencing and/or deletion duplication testing of the following 48 genes: APC, ATM, AXIN2, BARD1, BMPR1A, BRCA1, BRCA2, BRIP1, CDH1, CDK4, CDKN2A (p14ARF), CDKN2A (p16INK4a), CHEK2, CTNNA1, DICER1, EPCAM (Deletion/duplication testing only), GREM1 (promoter region deletion/duplication testing only), KIT, MEN1, MLH1, MSH2, MSH3, MSH6, MUTYH, NBN, NF1, NHTL1, PALB2, PDGFRA, PMS2, POLD1, POLE, PTEN, RAD50, RAD51C, RAD51D, RNF43, SDHB, SDHC, SDHD, SMAD4, SMARCA4.  STK11, TP53, TSC1, TSC2, and VHL.  The following genes were evaluated for sequence changes only: SDHA and HOXB13 c.251G>A variant only. The report date is July 05, 2018.   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    08/17/2018 - 07/11/2019 Chemotherapy   Taxol Herceptin followed by Herceptin maintenance   12/05/2018 - 01/02/2019 Radiation Therapy   Adjuvant XRT   01/25/2019 -  Anti-estrogen oral therapy   Anastrozole 75m daily, plan 5-7 years   07/13/2019 Cancer Staging   Staging form: Breast, AJCC 8th Edition - Pathologic stage from 07/13/2019: Stage IA (pT1b, pN0, cM0, G2, ER+, PR+, HER2+) - Signed by CGardenia Phlegm NP on 08/01/2019   05/12/2020 -  Chemotherapy    Patient is on Treatment Plan: COLORECTAL FOLFOX Q14D X 6 MONTHS      [place]  03/13/[place] Initial Diagnosis   CT Colonoscopy: Large mass centered about the medial wall cecum and terminal ileum, favoring colonic versus less 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 Adjacent ileocolic mesenteric adenopathy, consistent with nodal metastasis. 3 mm right lower lobe pulmonary nodule   04/29/2020 Cancer Staging   Staging form: Colon and Rectum, AJCC 8th Edition - Clinical: Stage IIIC (cT3, cN2b, cM0) - Signed by SLadell Pier MD on 04/29/2020   05/12/2020 -  Chemotherapy    Patient is on Treatment Plan: COLORECTAL FOLFOX Q14D X 6 MONTHS        CHIEF COMPLIANT: Cycle 8 Folfox  INTERVAL HISTORY: DRonnett Pullinis a 70y.o. with above-mentioned history of right breast cancer who underwent a lumpectomy, adjuvant chemotherapy,radiation,Herceptin maintenance,andis currently onanti-estrogen therapywith anastrozole.She also has a  history of severe iron deficiency anemia,and colon cancer for which she underwent surgery and is currently on chemotherapy with Folfox.She presents to the clinic todayfortreatment.   She is  very sensitive to cold stimuli.  She denies neuropathy symptoms.  ALLERGIES:  is allergic to atorvastatin and meperidine.  MEDICATIONS:  Current Outpatient Medications  Medication Sig Dispense Refill  . anastrozole (ARIMIDEX) 1 MG tablet TAKE 1 TABLET DAILY 90 tablet 3  . calcium carbonate (OSCAL) 1500 (600 Ca) MG TABS tablet Take 600 mg of elemental calcium by mouth 2 (two) times daily with a meal.    . digoxin (LANOXIN) 0.125 MG tablet Take 1 tablet (0.125 mg total) by mouth daily. 90 tablet 1  . escitalopram (LEXAPRO) 10 MG tablet Take 10 mg by mouth daily.    . flecainide (TAMBOCOR) 100 MG tablet TAKE 1 TABLET TWICE A DAY 60 tablet 0  . ibuprofen (ADVIL) 800 MG tablet Take 1 tablet (800 mg total) by mouth every 8 (eight) hours as needed. 30 tablet 0  . levothyroxine (SYNTHROID, LEVOTHROID) 100 MCG tablet Take 1 tablet by mouth daily.    Marland Kitchen lidocaine-prilocaine (EMLA) cream Apply 1 application topically as needed. Apply to [facility] ~ 1 hour prior to access 30 g 0  . lisinopril (PRINIVIL,ZESTRIL) 10 MG tablet Take 1 tablet (10 mg total) by mouth daily. 90 tablet 3  . metFORMIN (GLUCOPHAGE) 500 MG tablet Take 1 tablet by mouth 2 (two) times daily with a meal.     . metoprolol succinate (TOPROL-XL) 50 MG 24 hr tablet TAKE 1 TABLET DAILY (Patient taking differently: Take by mouth daily.) 90 tablet 3  . prochlorperazine (COMPAZINE) 10 MG tablet Take 1 tablet (10 mg total) by mouth every 6 (six) hours as needed for nausea or vomiting. 30 tablet 1  . warfarin (COUMADIN) 10 MG tablet TAKE ONE-HALF (1/2) TO ONE TABLET AS DIRECTED BY COUMADIN CLINIC 90 tablet 3   No current facility-administered medications for this visit.    PHYSICAL EXAMINATION: ECOG PERFORMANCE STATUS: 1 - Symptomatic but completely ambulatory  Vitals:   08/18/20 1102  BP: 132/77  Pulse: 72  Resp: 16  Temp: (!) 97 F (36.1 C)  SpO2: 98%   Filed Weights   08/18/20 1102  Weight: 155 lb 12.8 oz (70.7 kg)     LABORATORY  DATA:  I have reviewed the data as listed CMP Latest Ref Rng & Units 08/18/2020 08/04/2020 07/21/2020  Glucose 70 - 99 mg/dL 120(H) 118(H) 109(H)  BUN 8 - 23 mg/dL 11 11 9   Creatinine 0.44 - 1.00 mg/dL 0.63 0.62 0.62  Sodium 135 - 145 mmol/L 141 142 141  Potassium 3.5 - 5.1 mmol/L 3.9 4.3 3.9  Chloride 98 - 111 mmol/L 103 103 103  CO2 22 - 32 mmol/L 28 29 28   Calcium 8.9 - 10.3 mg/dL 9.3 9.9 9.6  Total Protein 6.5 - 8.1 g/dL 6.9 7.2 7.2  Total Bilirubin 0.3 - 1.2 mg/dL 0.2(L) 0.3 0.3  Alkaline Phos 38 - 126 U/L 105 115 105  AST 15 - 41 U/L 22 23 27   ALT 0 - 44 U/L 12 12 16     Lab Results  Component Value Date   WBC 6.3 08/18/2020   HGB 11.3 (L) 08/18/2020   HCT 37.7 08/18/2020   MCV 86.7 08/18/2020   PLT 97 (L) 08/18/2020   NEUTROABS 4.4 08/18/2020    ASSESSMENT & PLAN:  Malignant neoplasm of upper-outer quadrant of right breast in female, estrogen receptor positive ([place]) /05/2018:Right  lumpectomy ([facility]): IDC with DCIS, 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  Treatment plan: 1.Adj chemo with Taxol-Herceptin weekly X 12completed 7/23/2020and then q 3 weeks Herceptin.Completed 07/11/2019 2.Adjuvant radiation therapystarted 12/05/2018-01/02/19 3.Followed by adjuvant antiestrogen therapywith Anastrozole in 01/2019 ----------------------------------------------------------------------------------------------------------------------------------------------- Anastrozole on hold since Colon cancer chemo was started Jan 2022 (colon cancer was diagnosed due to work up for Iron deficiency anemia) Current treatment: FOLFOX adj chemo, today is cycle 8  Colon cancer ([place]) 1. 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 Moderately differentiated adenocarcinoma, 9/30 lymph nodes positive, lymphovascular invasion present including large vessel extramural invasion, loss of MLH1 and PMS2, MLH1  hyper methylation present ? Incomplete colonoscopy secondary to stricturing at the sigmoid colon ? Virtual colonoscopy 03/13/2020-mass centered at the medial wall of the cecum and terminal ileum, adjacent ileocolic mesenteric adenopathy, nonspecific 3 mm right lower lobe nodule ? PET 97/28/2060 hypermetabolic cecal/terminal ileum mass with adjacent enlarged and hypermetabolic ileocolic lymph nodes, previously noted segment 7 liver lesion-not hypermetabolic-benign etiology favored, dominant left lower lobe pulmonary nodule not hypermetabolic, other tiny nodules not hypermetabolic and below PET sensitivity ? Cycle 1 FOLFOX 05/12/2020   Chemo toxicities: 1.  Profound cold sensitivity 2. monitoring closely for neuropathy 3.  Okay to treat with a platelet count of 97  Return to clinic every 2 weeks for FOLFOX.    No orders of the defined types were placed in this encounter.  The patient has a good understanding of the overall plan. she agrees with it. she will call with any problems that may develop before the next visit here.  Total time spent: 30 mins including face to face time and time spent for planning, charting and coordination of care  Rulon Eisenmenger, MD, MPH 08/18/2020  I, Cloyde Reams Dorshimer, am acting as scribe for Dr. Nicholas Lose.  I have reviewed the above documentation for accuracy and completeness, and I agree with the above.

## 2020-08-18 ENCOUNTER — Encounter: Payer: Self-pay | Admitting: *Deleted

## 2020-08-18 ENCOUNTER — Inpatient Hospital Stay (HOSPITAL_BASED_OUTPATIENT_CLINIC_OR_DEPARTMENT_OTHER): Payer: Medicare Other | Admitting: Hematology and Oncology

## 2020-08-18 ENCOUNTER — Telehealth: Payer: Self-pay | Admitting: Cardiology

## 2020-08-18 ENCOUNTER — Ambulatory Visit (INDEPENDENT_AMBULATORY_CARE_PROVIDER_SITE_OTHER): Payer: Medicare Other | Admitting: Internal Medicine

## 2020-08-18 ENCOUNTER — Inpatient Hospital Stay: Payer: Medicare Other | Attending: Hematology and Oncology

## 2020-08-18 ENCOUNTER — Other Ambulatory Visit: Payer: Self-pay

## 2020-08-18 ENCOUNTER — Inpatient Hospital Stay: Payer: Medicare Other

## 2020-08-18 DIAGNOSIS — C50411 Malignant neoplasm of upper-outer quadrant of right female breast: Secondary | ICD-10-CM

## 2020-08-18 DIAGNOSIS — Z923 Personal history of irradiation: Secondary | ICD-10-CM | POA: Insufficient documentation

## 2020-08-18 DIAGNOSIS — Z170 Estrogen receptor positive status [ER+]: Secondary | ICD-10-CM

## 2020-08-18 DIAGNOSIS — Z5111 Encounter for antineoplastic chemotherapy: Secondary | ICD-10-CM | POA: Diagnosis present

## 2020-08-18 DIAGNOSIS — Z9221 Personal history of antineoplastic chemotherapy: Secondary | ICD-10-CM | POA: Diagnosis not present

## 2020-08-18 DIAGNOSIS — I480 Paroxysmal atrial fibrillation: Secondary | ICD-10-CM

## 2020-08-18 DIAGNOSIS — Z5181 Encounter for therapeutic drug level monitoring: Secondary | ICD-10-CM | POA: Diagnosis not present

## 2020-08-18 DIAGNOSIS — Z79899 Other long term (current) drug therapy: Secondary | ICD-10-CM | POA: Diagnosis not present

## 2020-08-18 DIAGNOSIS — Z79811 Long term (current) use of aromatase inhibitors: Secondary | ICD-10-CM | POA: Diagnosis not present

## 2020-08-18 DIAGNOSIS — C182 Malignant neoplasm of ascending colon: Secondary | ICD-10-CM

## 2020-08-18 DIAGNOSIS — Z7901 Long term (current) use of anticoagulants: Secondary | ICD-10-CM | POA: Diagnosis not present

## 2020-08-18 LAB — PROTIME-INR
INR: 4.2 (ref 0.8–1.2)
Prothrombin Time: 40.5 seconds — ABNORMAL HIGH (ref 11.4–15.2)

## 2020-08-18 LAB — CMP (CANCER CENTER ONLY)
ALT: 12 U/L (ref 0–44)
AST: 22 U/L (ref 15–41)
Albumin: 3.6 g/dL (ref 3.5–5.0)
Alkaline Phosphatase: 105 U/L (ref 38–126)
Anion gap: 10 (ref 5–15)
BUN: 11 mg/dL (ref 8–23)
CO2: 28 mmol/L (ref 22–32)
Calcium: 9.3 mg/dL (ref 8.9–10.3)
Chloride: 103 mmol/L (ref 98–111)
Creatinine: 0.63 mg/dL (ref 0.44–1.00)
GFR, Estimated: >60 mL/min (ref >60)
Glucose, Bld: 120 mg/dL — ABNORMAL HIGH (ref 70–99)
Potassium: 3.9 mmol/L (ref 3.5–5.1)
Sodium: 141 mmol/L (ref 135–145)
Total Bilirubin: 0.2 mg/dL — ABNORMAL LOW (ref 0.3–1.2)
Total Protein: 6.9 g/dL (ref 6.5–8.1)

## 2020-08-18 LAB — CBC WITH DIFFERENTIAL (CANCER CENTER ONLY)
Abs Immature Granulocytes: 0.01 10*3/uL (ref 0.00–0.07)
Basophils Absolute: 0 10*3/uL (ref 0.0–0.1)
Basophils Relative: 1 %
Eosinophils Absolute: 0 10*3/uL (ref 0.0–0.5)
Eosinophils Relative: 0 %
HCT: 37.7 % (ref 36.0–46.0)
Hemoglobin: 11.3 g/dL — ABNORMAL LOW (ref 12.0–15.0)
Immature Granulocytes: 0 %
Lymphocytes Relative: 18 %
Lymphs Abs: 1.1 10*3/uL (ref 0.7–4.0)
MCH: 26 pg (ref 26.0–34.0)
MCHC: 30 g/dL (ref 30.0–36.0)
MCV: 86.7 fL (ref 80.0–100.0)
Monocytes Absolute: 0.7 10*3/uL (ref 0.1–1.0)
Monocytes Relative: 12 %
Neutro Abs: 4.4 10*3/uL (ref 1.7–7.7)
Neutrophils Relative %: 69 %
Platelet Count: 97 10*3/uL — ABNORMAL LOW (ref 150–400)
RBC: 4.35 MIL/uL (ref 3.87–5.11)
RDW: 24.4 % — ABNORMAL HIGH (ref 11.5–15.5)
WBC Count: 6.3 10*3/uL (ref 4.0–10.5)
nRBC: 0 % (ref 0.0–0.2)

## 2020-08-18 MED ORDER — OXALIPLATIN CHEMO INJECTION 100 MG/20ML
85.0000 mg/m2 | Freq: Once | INTRAVENOUS | Status: AC
  Administered 2020-08-18: 155 mg via INTRAVENOUS
  Filled 2020-08-18: qty 31

## 2020-08-18 MED ORDER — PALONOSETRON HCL INJECTION 0.25 MG/5ML
0.2500 mg | Freq: Once | INTRAVENOUS | Status: AC
  Administered 2020-08-18: 0.25 mg via INTRAVENOUS

## 2020-08-18 MED ORDER — HEPARIN SOD (PORK) LOCK FLUSH 100 UNIT/ML IV SOLN
500.0000 [IU] | Freq: Once | INTRAVENOUS | Status: DC | PRN
  Filled 2020-08-18: qty 5

## 2020-08-18 MED ORDER — FLUOROURACIL CHEMO INJECTION 2.5 GM/50ML
400.0000 mg/m2 | Freq: Once | INTRAVENOUS | Status: AC
  Administered 2020-08-18: 750 mg via INTRAVENOUS
  Filled 2020-08-18: qty 15

## 2020-08-18 MED ORDER — DEXTROSE 5 % IV SOLN
Freq: Once | INTRAVENOUS | Status: AC
Start: 2020-08-18 — End: 2020-08-18
  Filled 2020-08-18: qty 250

## 2020-08-18 MED ORDER — LEUCOVORIN CALCIUM INJECTION 350 MG
400.0000 mg/m2 | Freq: Once | INTRAVENOUS | Status: AC
  Administered 2020-08-18: 740 mg via INTRAVENOUS
  Filled 2020-08-18: qty 37

## 2020-08-18 MED ORDER — DEXAMETHASONE SODIUM PHOSPHATE 100 MG/10ML IJ SOLN
10.0000 mg | Freq: Once | INTRAMUSCULAR | Status: AC
  Administered 2020-08-18: 10 mg via INTRAVENOUS
  Filled 2020-08-18: qty 10

## 2020-08-18 MED ORDER — PALONOSETRON HCL INJECTION 0.25 MG/5ML
INTRAVENOUS | Status: AC
  Filled 2020-08-18: qty 5

## 2020-08-18 MED ORDER — SODIUM CHLORIDE 0.9 % IV SOLN
2400.0000 mg/m2 | INTRAVENOUS | Status: DC
  Administered 2020-08-18: 4450 mg via INTRAVENOUS
  Filled 2020-08-18: qty 89

## 2020-08-18 MED ORDER — SODIUM CHLORIDE 0.9% FLUSH
10.0000 mL | INTRAVENOUS | Status: DC | PRN
  Filled 2020-08-18: qty 10

## 2020-08-18 NOTE — Progress Notes (Signed)
Blood return noted before, during, and after Fluorouracil IV injection.

## 2020-08-18 NOTE — Patient Instructions (Addendum)
[facility]  Discharge Instructions: Thank you for choosing [facility] to provide your oncology and hematology care.   If you have a lab appointment with the [facility], please go directly to the [facility] and check in at the registration area.   Wear comfortable clothing and clothing appropriate for easy access to any Portacath or PICC line.   We strive to give you quality time with your provider. You may need to reschedule your appointment if you arrive late (15 or more minutes).  Arriving late affects you and other patients whose appointments are after yours.  Also, if you miss three or more appointments without notifying the office, you may be dismissed from the clinic at the provider's discretion.      For prescription refill requests, have your pharmacy contact our office and allow 72 hours for refills to be completed.    Today you received the following chemotherapy and/or immunotherapy agents: Oxaliplatin, Leucovorin, and Fluorouracil.    To help prevent nausea and vomiting after your treatment, we encourage you to take your nausea medication as directed.  BELOW ARE SYMPTOMS THAT SHOULD BE REPORTED IMMEDIATELY: . *FEVER GREATER THAN 100.4 F (38 C) OR HIGHER . *CHILLS OR SWEATING . *NAUSEA AND VOMITING THAT IS NOT CONTROLLED WITH YOUR NAUSEA MEDICATION . *UNUSUAL SHORTNESS OF BREATH . *UNUSUAL BRUISING OR BLEEDING . *URINARY PROBLEMS (pain or burning when urinating, or frequent urination) . *BOWEL PROBLEMS (unusual diarrhea, constipation, pain near the anus) . TENDERNESS IN MOUTH AND THROAT WITH OR WITHOUT PRESENCE OF ULCERS (sore throat, sores in mouth, or a toothache) . UNUSUAL RASH, SWELLING OR PAIN  . UNUSUAL VAGINAL DISCHARGE OR ITCHING   Items with * indicate a potential emergency and should be followed up as soon as possible or go to the Emergency Department if any problems should occur.  Please show the CHEMOTHERAPY  ALERT CARD or IMMUNOTHERAPY ALERT CARD at check-in to the Emergency Department and triage nurse.  Should you have questions after your visit or need to cancel or reschedule your appointment, please contact [facility]  Dept: [phone]  and follow the prompts.  Office hours are 8:00 a.m. to 4:30 p.m. Monday - Friday. Please note that voicemails left after 4:00 p.m. may not be returned until the following business day.  We are closed weekends and major holidays. You have access to a nurse at all times for urgent questions. Please call the main number to the clinic Dept: [phone] and follow the prompts.   For any non-urgent questions, you may also contact your provider using MyChart. We now offer e-Visits for anyone 37 and older to request care online for non-urgent symptoms. For details visit [email or web address].   Also download the MyChart app! Go to the app store, search "MyChart", open the app, select [facility], and log in with your MyChart username and password.  Due to Covid, a mask is required upon entering the hospital/clinic. If you do not have a mask, one will be given to you upon arrival. For doctor visits, patients may have 1 support person aged 31 or older with them. For treatment visits, patients cannot have anyone with them due to current Covid guidelines and our immunocompromised population.  [facility] Discharge Instructions for Patients receiving Home Portable Chemo Pump    **The bag should finish at 46 hours, 96 hours or 7 days. For example, if your pump is scheduled for 46 hours and  it was put on at 4pm, it should finish at 2 pm the day it is scheduled to come off regardless of your appointment time.    Estimated time to finish   _________________________ (Have your nurse fill in)     ** if the display on your pump reads "Low Volume" and it is beeping, take the batteries out of the pump and come to the cancer center for it  to be taken off.   **If the pump alarms go off prior to the pump reading "Low Volume" then call the [phone] and someone can assist you.  **If the plunger comes out and the bag fluid is running out, please use your chemo spill kit to clean up the spill. Do not use paper towels or other house hold products.  ** If you have problems or questions regarding your pump, please call either the [phone] or the cancer center Monday-Friday 8:00am-4:30pm at [phone] and we will assist you.  If you are unable to get assistance then go to [facility] Emergency [facility], ask the staff to contact the IV team for assistance.     Thrombocytopenia Thrombocytopenia means that you have a low number of platelets in your blood. Platelets are tiny cells in the blood. When you bleed, they clump together at the cut or injury to stop the bleeding. This is called blood clotting. If you do not have enough platelets, it can cause bleeding problems. Some cases of this condition are mild while others are more severe. What are the causes? This condition may be caused by:  Your body not making enough platelets. This may be caused by: ? Your bone marrow not making blood cells (aplastic anemia). ? Cancer in the bone marrow. ? Certain medicines. ? Infection in the bone marrow. ? Drinking a lot of alcohol.  Your body destroying platelets too quickly. This may be caused by: ? Certain immune diseases. ? Certain medicines. ? Certain blood clotting disorders. ? Certain disorders that are passed from parent to child (inherited). ? Certain bleeding disorders. ? Pregnancy. ? Having a spleen that is larger than normal. What are the signs or symptoms?  Bleeding that is not normal.  Nosebleeds.  Heavy menstrual periods.  Blood in the pee (urine) or poop (stool).  A purple-like color to the skin (purpura).  Bruising.  A rash that looks like pinpoint, purple-red spots (petechiae). How is this  treated?  Treatment of another condition that is causing the low platelet count.  Medicines to help protect your platelets from being destroyed.  A replacement (transfusion) of platelets to stop or prevent bleeding.  Surgery to remove the spleen. Follow these instructions at home: Activity  Avoid activities that could cause you to get hurt or bruised. Follow instructions about how to prevent falls.  Take care not to cut yourself: ? When you shave. ? When you use scissors, needles, knives, or other tools.  Take care not to burn yourself: ? When you use an iron. ? When you cook. General instructions  Check your skin and the inside of your mouth for bruises or blood as told by your doctor.  Check to see if there is blood in your spit (sputum), pee, and poop. Do this as told by your doctor.  Do not drink alcohol.  Take over-the-counter and prescription medicines only as told by your doctor.  Do not take any medicines that have aspirin or NSAIDs in them. These medicines can thin your blood and cause you to  bleed.  Tell all of your doctors that you have this condition. Be sure to tell your dentist and eye doctor too.   Contact a doctor if:  You have bruises and you do not know why. Get help right away if:  You are bleeding anywhere on your body.  You have blood in your spit, pee, or poop. Summary  Thrombocytopenia means that you have a low number of platelets in your blood.  Platelets are needed for blood clotting.  Symptoms of this condition include bleeding that is not normal, and bruising.  Take care not to cut or burn yourself. This information is not intended to replace advice given to you by your health care provider. Make sure you discuss any questions you have with your health care provider. Document Revised: 12/29/2017 Document Reviewed: 12/29/2017 Elsevier Patient Education  [place].

## 2020-08-18 NOTE — Telephone Encounter (Signed)
Kelsie with the [facility] called to provide patient's INR for today, 4.2. Anti-coag encounter is visible in chart.  If additional questions, a call may be returned to [facility] at [phone].

## 2020-08-18 NOTE — Progress Notes (Unsigned)
Critical INR called to Heinz Knuckles, RN at [facility] by Prudencio Burly

## 2020-08-18 NOTE — Progress Notes (Signed)
CRITICAL VALUE STICKER  CRITICAL VALUE: INR 4.2  RECEIVER (on-site recipient of call): Merleen Nicely, [place] NOTIFIED: 08/18/20 at 81   MD NOTIFIED: Nicholas Lose, MD  [place]: [id number]  RESPONSE:  Per MD RN to notify her cardiologist of results for adjustment of Warfarin Dosage. RN notified [facility] at [facility] of results.

## 2020-08-18 NOTE — Assessment & Plan Note (Signed)
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  Treatment plan: 1.Adj chemo with Taxol-Herceptin weekly X 12completed 7/23/2020and then q 3 weeks Herceptin.Completed 07/11/2019 2.Adjuvant radiation therapystarted 12/05/2018-01/02/19 3.Followed by adjuvant antiestrogen therapywith Anastrozole in 01/2019 ----------------------------------------------------------------------------------------------------------------------------------------------- Anastrozole on hold since Colon cancer chemo was started Jan 2022 (colon cancer was diagnosed due to work up for Iron deficiency anemia) Current treatment: FOLFOX adj chemo  Colon cancer ([place]) 1. 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 Moderately differentiated adenocarcinoma, 9/30 lymph nodes positive, lymphovascular invasion present including large vessel extramural invasion, loss of MLH1 and PMS2, MLH1 hyper methylation present ? Incomplete colonoscopy secondary to stricturing at the sigmoid colon ? Virtual colonoscopy 03/13/2020-mass centered at the medial wall of the cecum and terminal ileum, adjacent ileocolic mesenteric adenopathy, nonspecific 3 mm right lower lobe nodule ? PET 70/23/0172 hypermetabolic cecal/terminal ileum mass with adjacent enlarged and hypermetabolic ileocolic lymph nodes, previously noted segment 7 liver lesion-not hypermetabolic-benign etiology favored, dominant left lower lobe pulmonary nodule not hypermetabolic, other tiny nodules not hypermetabolic and below PET sensitivity ? Cycle 1 FOLFOX 05/12/2020 Today is cycle 8  2. Iron deficiency anemia secondary to #1 3. Diabetes 4. Atrial fibrillation-maintained on Coumadin: Awaiting today's INR.  Coumadin clinic is managing her. She will be in touch with them to discuss dosing. 5. Hypothyroidism 6. Ongoing tobacco use Return to  clinic every 2 weeks for FOLFOX.

## 2020-08-19 NOTE — Telephone Encounter (Signed)
Looks like this was already addressed.

## 2020-08-20 ENCOUNTER — Inpatient Hospital Stay: Payer: Medicare Other

## 2020-08-20 ENCOUNTER — Other Ambulatory Visit: Payer: Self-pay

## 2020-08-20 VITALS — BP 135/69 | HR 67 | Temp 97.8°F | Resp 18

## 2020-08-20 DIAGNOSIS — D500 Iron deficiency anemia secondary to blood loss (chronic): Secondary | ICD-10-CM

## 2020-08-20 DIAGNOSIS — C50411 Malignant neoplasm of upper-outer quadrant of right female breast: Secondary | ICD-10-CM

## 2020-08-20 DIAGNOSIS — Z5111 Encounter for antineoplastic chemotherapy: Secondary | ICD-10-CM | POA: Diagnosis not present

## 2020-08-20 DIAGNOSIS — Z170 Estrogen receptor positive status [ER+]: Secondary | ICD-10-CM

## 2020-08-20 MED ORDER — HEPARIN SOD (PORK) LOCK FLUSH 100 UNIT/ML IV SOLN
500.0000 [IU] | Freq: Once | INTRAVENOUS | Status: AC | PRN
  Administered 2020-08-20: 500 [IU]
  Filled 2020-08-20: qty 5

## 2020-08-20 MED ORDER — SODIUM CHLORIDE 0.9% FLUSH
10.0000 mL | Freq: Once | INTRAVENOUS | Status: AC | PRN
  Administered 2020-08-20: 10 mL
  Filled 2020-08-20: qty 10

## 2020-08-29 ENCOUNTER — Other Ambulatory Visit: Payer: Self-pay

## 2020-08-29 DIAGNOSIS — C50411 Malignant neoplasm of upper-outer quadrant of right female breast: Secondary | ICD-10-CM

## 2020-08-29 DIAGNOSIS — I480 Paroxysmal atrial fibrillation: Secondary | ICD-10-CM

## 2020-08-29 DIAGNOSIS — D500 Iron deficiency anemia secondary to blood loss (chronic): Secondary | ICD-10-CM

## 2020-08-31 NOTE — Assessment & Plan Note (Signed)
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  Treatment plan: 1.Adj chemo with Taxol-Herceptin weekly X 12completed 7/23/2020and then q 3 weeks Herceptin.Completed 07/11/2019 2.Adjuvant radiation therapystarted 12/05/2018-01/02/19 3.Followed by adjuvant antiestrogen therapywith Anastrozole in 01/2019 ----------------------------------------------------------------------------------------------------------------------------------------------- Anastrozole on hold since Colon cancer chemo was started Jan 2022 (colon cancer was diagnosed due to work up for Iron deficiency anemia) Current treatment: FOLFOX adj chemo, today is cycle 9  Colon cancer ([place]) 1. 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 Moderately differentiated adenocarcinoma, 9/30 lymph nodes positive, lymphovascular invasion present including large vessel extramural invasion, loss of MLH1 and PMS2, MLH1 hyper methylation present ? Incomplete colonoscopy secondary to stricturing at the sigmoid colon ? Virtual colonoscopy 03/13/2020-mass centered at the medial wall of the cecum and terminal ileum, adjacent ileocolic mesenteric adenopathy, nonspecific 3 mm right lower lobe nodule ? PET 37/94/4461 hypermetabolic cecal/terminal ileum mass with adjacent enlarged and hypermetabolic ileocolic lymph nodes, previously noted segment 7 liver lesion-not hypermetabolic-benign etiology favored, dominant left lower lobe pulmonary nodule not hypermetabolic, other tiny nodules not hypermetabolic and below PET sensitivity ? Cycle 1 FOLFOX 05/12/2020   Chemo toxicities: 1.  Profound cold sensitivity 2. monitoring closely for neuropathy 3.  Okay to treat with a platelet count of 97  Return to clinic every 2 weeks for FOLFOX.

## 2020-09-01 ENCOUNTER — Other Ambulatory Visit: Payer: Self-pay

## 2020-09-01 ENCOUNTER — Encounter: Payer: Self-pay | Admitting: *Deleted

## 2020-09-01 ENCOUNTER — Ambulatory Visit (INDEPENDENT_AMBULATORY_CARE_PROVIDER_SITE_OTHER): Payer: Medicare Other | Admitting: Cardiology

## 2020-09-01 ENCOUNTER — Inpatient Hospital Stay: Payer: Medicare Other

## 2020-09-01 ENCOUNTER — Inpatient Hospital Stay (HOSPITAL_BASED_OUTPATIENT_CLINIC_OR_DEPARTMENT_OTHER): Payer: Medicare Other | Admitting: Hematology and Oncology

## 2020-09-01 DIAGNOSIS — I480 Paroxysmal atrial fibrillation: Secondary | ICD-10-CM

## 2020-09-01 DIAGNOSIS — Z170 Estrogen receptor positive status [ER+]: Secondary | ICD-10-CM

## 2020-09-01 DIAGNOSIS — C50411 Malignant neoplasm of upper-outer quadrant of right female breast: Secondary | ICD-10-CM

## 2020-09-01 DIAGNOSIS — Z7901 Long term (current) use of anticoagulants: Secondary | ICD-10-CM

## 2020-09-01 DIAGNOSIS — Z5181 Encounter for therapeutic drug level monitoring: Secondary | ICD-10-CM | POA: Diagnosis not present

## 2020-09-01 DIAGNOSIS — Z5111 Encounter for antineoplastic chemotherapy: Secondary | ICD-10-CM | POA: Diagnosis not present

## 2020-09-01 DIAGNOSIS — C182 Malignant neoplasm of ascending colon: Secondary | ICD-10-CM

## 2020-09-01 LAB — CBC WITH DIFFERENTIAL (CANCER CENTER ONLY)
Abs Immature Granulocytes: 0.01 10*3/uL (ref 0.00–0.07)
Basophils Absolute: 0 10*3/uL (ref 0.0–0.1)
Basophils Relative: 0 %
Eosinophils Absolute: 0 10*3/uL (ref 0.0–0.5)
Eosinophils Relative: 0 %
HCT: 36.5 % (ref 36.0–46.0)
Hemoglobin: 11 g/dL — ABNORMAL LOW (ref 12.0–15.0)
Immature Granulocytes: 0 %
Lymphocytes Relative: 18 %
Lymphs Abs: 1 10*3/uL (ref 0.7–4.0)
MCH: 26.5 pg (ref 26.0–34.0)
MCHC: 30.1 g/dL (ref 30.0–36.0)
MCV: 88 fL (ref 80.0–100.0)
Monocytes Absolute: 0.7 10*3/uL (ref 0.1–1.0)
Monocytes Relative: 12 %
Neutro Abs: 3.7 10*3/uL (ref 1.7–7.7)
Neutrophils Relative %: 70 %
Platelet Count: 73 10*3/uL — ABNORMAL LOW (ref 150–400)
RBC: 4.15 MIL/uL (ref 3.87–5.11)
RDW: 23.6 % — ABNORMAL HIGH (ref 11.5–15.5)
WBC Count: 5.4 10*3/uL (ref 4.0–10.5)
nRBC: 0 % (ref 0.0–0.2)

## 2020-09-01 LAB — CMP (CANCER CENTER ONLY)
ALT: 14 U/L (ref 0–44)
AST: 23 U/L (ref 15–41)
Albumin: 3.5 g/dL (ref 3.5–5.0)
Alkaline Phosphatase: 103 U/L (ref 38–126)
Anion gap: 5 (ref 5–15)
BUN: 10 mg/dL (ref 8–23)
CO2: 31 mmol/L (ref 22–32)
Calcium: 9.4 mg/dL (ref 8.9–10.3)
Chloride: 104 mmol/L (ref 98–111)
Creatinine: 0.59 mg/dL (ref 0.44–1.00)
GFR, Estimated: >60 mL/min (ref >60)
Glucose, Bld: 120 mg/dL — ABNORMAL HIGH (ref 70–99)
Potassium: 3.7 mmol/L (ref 3.5–5.1)
Sodium: 140 mmol/L (ref 135–145)
Total Bilirubin: 0.3 mg/dL (ref 0.3–1.2)
Total Protein: 6.9 g/dL (ref 6.5–8.1)

## 2020-09-01 LAB — PROTIME-INR
INR: 4.5 (ref 0.8–1.2)
Prothrombin Time: 42.7 seconds — ABNORMAL HIGH (ref 11.4–15.2)

## 2020-09-01 MED ORDER — SODIUM CHLORIDE 0.9 % IV SOLN
2400.0000 mg/m2 | INTRAVENOUS | Status: DC
  Administered 2020-09-01: 4450 mg via INTRAVENOUS
  Filled 2020-09-01: qty 89

## 2020-09-01 MED ORDER — DEXTROSE 5 % IV SOLN
Freq: Once | INTRAVENOUS | Status: AC
  Filled 2020-09-01: qty 250

## 2020-09-01 MED ORDER — FLUOROURACIL CHEMO INJECTION 2.5 GM/50ML
400.0000 mg/m2 | Freq: Once | INTRAVENOUS | Status: AC
  Administered 2020-09-01: 750 mg via INTRAVENOUS
  Filled 2020-09-01: qty 15

## 2020-09-01 MED ORDER — SODIUM CHLORIDE 0.9 % IV SOLN
10.0000 mg | Freq: Once | INTRAVENOUS | Status: AC
  Administered 2020-09-01: 10 mg via INTRAVENOUS
  Filled 2020-09-01: qty 10

## 2020-09-01 MED ORDER — OXALIPLATIN CHEMO INJECTION 100 MG/20ML
70.0000 mg/m2 | Freq: Once | INTRAVENOUS | Status: AC
  Administered 2020-09-01: 130 mg via INTRAVENOUS
  Filled 2020-09-01: qty 20

## 2020-09-01 MED ORDER — LEUCOVORIN CALCIUM INJECTION 350 MG
400.0000 mg/m2 | Freq: Once | INTRAVENOUS | Status: AC
  Administered 2020-09-01: 740 mg via INTRAVENOUS
  Filled 2020-09-01: qty 37

## 2020-09-01 MED ORDER — PALONOSETRON HCL INJECTION 0.25 MG/5ML
0.2500 mg | Freq: Once | INTRAVENOUS | Status: AC
  Administered 2020-09-01: 0.25 mg via INTRAVENOUS

## 2020-09-01 MED ORDER — PALONOSETRON HCL INJECTION 0.25 MG/5ML
INTRAVENOUS | Status: AC
  Filled 2020-09-01: qty 5

## 2020-09-01 MED ORDER — SODIUM CHLORIDE 0.9% FLUSH
10.0000 mL | INTRAVENOUS | Status: DC | PRN
  Filled 2020-09-01: qty 10

## 2020-09-01 MED ORDER — HEPARIN SOD (PORK) LOCK FLUSH 100 UNIT/ML IV SOLN
500.0000 [IU] | Freq: Once | INTRAVENOUS | Status: DC | PRN
  Filled 2020-09-01: qty 5

## 2020-09-01 NOTE — Progress Notes (Signed)
Patient Care Team: Patient, No Pcp Per (Inactive) as PCP - General (General Practice) Sabrina Liter, MD as PCP - Cardiology (Cardiology) Sabrina Luna, MD as Consulting Physician (General Surgery) Sabrina Lose, MD as Consulting Physician (Hematology and Oncology) Sabrina Gibson, MD as Attending Physician (Radiation Oncology) Sabrina Liter, MD as Consulting Physician (Cardiology) Sabrina Finner, RN as Oncology Nurse Navigator Sabrina Pier, MD as Consulting Physician (Oncology)  DIAGNOSIS:    ICD-10-CM   1. Malignant neoplasm of upper-outer quadrant of right breast in female, estrogen receptor positive ([place])  C50.411    Z17.0     SUMMARY OF ONCOLOGIC HISTORY: Oncology History  Malignant neoplasm of upper-outer quadrant of right breast in female, estrogen receptor positive ([place])  06/19/2018 Initial Diagnosis   Screening mammogram detected right breast mass at 9 o'clock position 3 cm from the nipple, ultrasound revealed 7 x 5 x 5 mm mass, axillary ultrasound normal-appearing lymph nodes, ultrasound biopsy: Grade 1 IDC ER 100%, PR 10%, Ki-67 10%, HER-2 3+ positive, T1b N0 stage Ia   06/28/2018 Cancer Staging   Staging form: Breast, AJCC 8th Edition - Clinical stage from 06/28/2018: Stage IA (cT1b, cN0, cM0, G1, ER+, PR+, HER2+) - Signed by Sabrina Lose, MD on 06/28/2018   07/05/2018 Genetic Testing   Negative genetic testing on the common hereditary cancer panel.  The Hereditary Gene Panel offered by [facility] includes sequencing and/or deletion duplication testing of the following 48 genes: APC, ATM, AXIN2, BARD1, BMPR1A, BRCA1, BRCA2, BRIP1, CDH1, CDK4, CDKN2A (p14ARF), CDKN2A (p16INK4a), CHEK2, CTNNA1, DICER1, EPCAM (Deletion/duplication testing only), GREM1 (promoter region deletion/duplication testing only), KIT, MEN1, MLH1, MSH2, MSH3, MSH6, MUTYH, NBN, NF1, NHTL1, PALB2, PDGFRA, PMS2, POLD1, POLE, PTEN, RAD50, RAD51C, RAD51D, RNF43, SDHB, SDHC, SDHD, SMAD4, SMARCA4.  STK11, TP53, TSC1, TSC2, and VHL.  The following genes were evaluated for sequence changes only: SDHA and HOXB13 c.251G>A variant only. The report date is July 05, 2018.   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    08/17/2018 - 07/11/2019 Chemotherapy   Taxol Herceptin followed by Herceptin maintenance   12/05/2018 - 01/02/2019 Radiation Therapy   Adjuvant XRT   01/25/2019 -  Anti-estrogen oral therapy   Anastrozole 32m daily, plan 5-7 years   07/13/2019 Cancer Staging   Staging form: Breast, AJCC 8th Edition - Pathologic stage from 07/13/2019: Stage IA (pT1b, pN0, cM0, G2, ER+, PR+, HER2+) - Signed by CGardenia Phlegm NP on 08/01/2019   05/12/2020 -  Chemotherapy    Patient is on Treatment Plan: COLORECTAL FOLFOX Q14D X 6 MONTHS      [place]  03/13/2020 Initial Diagnosis   CT Colonoscopy: Large mass centered about the medial wall cecum and terminal ileum, favoring colonic versus less 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 Adjacent ileocolic mesenteric adenopathy, consistent with nodal metastasis. 3 mm right lower lobe pulmonary nodule   04/29/2020 Cancer Staging   Staging form: Colon and Rectum, AJCC 8th Edition - Clinical: Stage IIIC (cT3, cN2b, cM0) - Signed by SLadell Pier MD on 04/29/2020   05/12/2020 -  Chemotherapy    Patient is on Treatment Plan: COLORECTAL FOLFOX Q14D X 6 MONTHS        CHIEF COMPLIANT: Cycle 9 Folfox  INTERVAL HISTORY: DDariel [name] a 70y.o. with above-mentioned history of right breast cancer who underwent a lumpectomy, adjuvant chemotherapy,radiation,Herceptin maintenance,andis currently onanti-estrogen therapywith anastrozole.She also has a  history of severe iron deficiency anemia,and colon cancer for which she underwent surgery and iscurrently on chemotherapy with Folfox.She presents to the clinic todayfortreatment.   She  denies any neuropathy symptoms.  Denies any nausea or vomiting.  Denies any diarrhea.  ALLERGIES:  is allergic to atorvastatin and meperidine.  MEDICATIONS:  Current Outpatient Medications  Medication Sig Dispense Refill  . anastrozole (ARIMIDEX) 1 MG tablet TAKE 1 TABLET DAILY 90 tablet 3  . calcium carbonate (OSCAL) 1500 (600 Ca) MG TABS tablet Take 600 mg of elemental calcium by mouth 2 (two) times daily with a meal.    . digoxin (LANOXIN) 0.125 MG tablet Take 1 tablet (0.125 mg total) by mouth daily. 90 tablet 1  . escitalopram (LEXAPRO) 10 MG tablet Take 10 mg by mouth daily.    . flecainide (TAMBOCOR) 100 MG tablet TAKE 1 TABLET TWICE A DAY 60 tablet 0  . ibuprofen (ADVIL) 800 MG tablet Take 1 tablet (800 mg total) by mouth every 8 (eight) hours as needed. 30 tablet 0  . levothyroxine (SYNTHROID, LEVOTHROID) 100 MCG tablet Take 1 tablet by mouth daily.    Marland Kitchen lidocaine-prilocaine (EMLA) cream Apply 1 application topically as needed. Apply to [facility] ~ 1 hour prior to access 30 g 0  . lisinopril (PRINIVIL,ZESTRIL) 10 MG tablet Take 1 tablet (10 mg total) by mouth daily. 90 tablet 3  . metFORMIN (GLUCOPHAGE) 500 MG tablet Take 1 tablet by mouth 2 (two) times daily with a meal.     . metoprolol succinate (TOPROL-XL) 50 MG 24 hr tablet TAKE 1 TABLET DAILY (Patient taking differently: Take by mouth daily.) 90 tablet 3  . prochlorperazine (COMPAZINE) 10 MG tablet Take 1 tablet (10 mg total) by mouth every 6 (six) hours as needed for nausea or vomiting. 30 tablet 1  . warfarin (COUMADIN) 10 MG tablet TAKE ONE-HALF (1/2) TO ONE TABLET AS DIRECTED BY COUMADIN CLINIC 90 tablet 3   No current facility-administered medications for this visit.    PHYSICAL EXAMINATION: ECOG PERFORMANCE STATUS: 1 - Symptomatic but completely ambulatory  There were no vitals filed for this visit. There were no vitals filed for this visit.   LABORATORY DATA:  I have reviewed the data as listed CMP Latest Ref Rng &  Units 09/01/2020 08/18/2020 08/04/2020  Glucose 70 - 99 mg/dL 120(H) 120(H) 118(H)  BUN 8 - 23 mg/dL _0 Creatinine 0.44 - 1.00 mg/dL 0.59 0.63 0.62  Sodium 135 - 145 mmol/L 140 141 142  Potassium 3.5 - 5.1 mmol/L 3.7 3.9 4.3  Chloride 98 - 111 mmol/L 104 103 103  CO2 22 - 32 mmol/L _1 Calcium 8.9 - 10.3 mg/dL 9.4 9.3 9.9  Total Protein 6.5 - 8.1 g/dL 6.9 6.9 7.2  Total Bilirubin 0.3 - 1.2 mg/dL 0.3 0.2(L) 0.3  Alkaline Phos 38 - 126 U/L 103 105 115  AST 15 - 41 U/L _2 ALT 0 - 44 U/L _3 Lab Results  Component Value Date   WBC 5.4 09/01/2020   HGB 11.0 (L) 09/01/2020   HCT 36.5 09/01/2020   MCV 88.0 09/01/2020   PLT 73 (L) 09/01/2020   NEUTROABS 3.7 09/01/2020    ASSESSMENT & PLAN:  Malignant neoplasm of upper-outer quadrant of right breast in female, estrogen receptor positive ([place]) /05/2018:Right lumpectomy ([facility]): IDC with DCIS, 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  Treatment plan:  1.Adj chemo with Taxol-Herceptin weekly X 12completed 7/23/2020and then q 3 weeks Herceptin.Completed 07/11/2019 2.Adjuvant radiation therapystarted 12/05/2018-01/02/19 3.Followed by adjuvant antiestrogen therapywith Anastrozole in 01/2019 ----------------------------------------------------------------------------------------------------------------------------------------------- Anastrozole on hold since Colon cancer chemo was started Jan 2022 (colon cancer was diagnosed due to work up for Iron deficiency anemia) Current treatment: FOLFOX adj chemo, today is cycle 9  Colon cancer ([place]) 1. 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 Moderately differentiated adenocarcinoma, 9/30 lymph nodes positive, lymphovascular invasion present including large vessel extramural invasion, loss of MLH1 and PMS2, MLH1 hyper methylation present ? Incomplete colonoscopy secondary to  stricturing at the sigmoid colon ? Virtual colonoscopy 03/13/2020-mass centered at the medial wall of the cecum and terminal ileum, adjacent ileocolic mesenteric adenopathy, nonspecific 3 mm right lower lobe nodule ? PET 70/04/7492 hypermetabolic cecal/terminal ileum mass with adjacent enlarged and hypermetabolic ileocolic lymph nodes, previously noted segment 7 liver lesion-not hypermetabolic-benign etiology favored, dominant left lower lobe pulmonary nodule not hypermetabolic, other tiny nodules not hypermetabolic and below PET sensitivity ? Cycle 1 FOLFOX 05/12/2020   Chemo toxicities: 1.  Profound cold sensitivity 2. monitoring closely for neuropathy 3.  Okay to treat with a platelet count of 73 (I reduced the dosage of oxaliplatin)  Return to clinic every 2 weeks for FOLFOX.    No orders of the defined types were placed in this encounter.  The patient has a good understanding of the overall plan. she agrees with it. she will call with any problems that may develop before the next visit here.  Total time spent: 30 mins including face to face time and time spent for planning, charting and coordination of care  Rulon Eisenmenger, MD, MPH 09/01/2020  I, Molly Dorshimer, am acting as scribe for Dr. Nicholas [name].  I have reviewed the above documentation for accuracy and completeness, and I agree with the above.

## 2020-09-01 NOTE — Progress Notes (Unsigned)
Pt inr 42.7 4.5 called to kelsey desota at 1148 by hflynt 09/01/20

## 2020-09-01 NOTE — Progress Notes (Signed)
CRITICAL VALUE STICKER  CRITICAL VALUE: PT 42.7 INR 4.5  Geraldo Docker, RN  DATE & TIME NOTIFIED: 09/01/20 at 1200   MD NOTIFIED: Nicholas Lose, MD   RESPONSE: MD notified and verbalized understanding.  States pt needing to call cardiologist with lab results and Warfarin dosing.  Pt verbalized understanding.

## 2020-09-03 ENCOUNTER — Other Ambulatory Visit: Payer: Self-pay

## 2020-09-03 ENCOUNTER — Inpatient Hospital Stay: Payer: Medicare Other

## 2020-09-03 VITALS — BP 113/62 | HR 70 | Temp 99.1°F | Resp 18

## 2020-09-03 DIAGNOSIS — Z5111 Encounter for antineoplastic chemotherapy: Secondary | ICD-10-CM | POA: Diagnosis not present

## 2020-09-03 DIAGNOSIS — Z170 Estrogen receptor positive status [ER+]: Secondary | ICD-10-CM

## 2020-09-03 DIAGNOSIS — C182 Malignant neoplasm of ascending colon: Secondary | ICD-10-CM

## 2020-09-03 MED ORDER — HEPARIN SOD (PORK) LOCK FLUSH 100 UNIT/ML IV SOLN
500.0000 [IU] | Freq: Once | INTRAVENOUS | Status: AC | PRN
  Administered 2020-09-03: 500 [IU]
  Filled 2020-09-03: qty 5

## 2020-09-03 MED ORDER — SODIUM CHLORIDE 0.9% FLUSH
10.0000 mL | INTRAVENOUS | Status: DC | PRN
  Administered 2020-09-03: 10 mL
  Filled 2020-09-03: qty 10

## 2020-09-14 NOTE — Progress Notes (Signed)
Patient Care Team: Patient, No Pcp Per (Inactive) as PCP - General (General Practice) Park Liter, MD as PCP - Cardiology (Cardiology) Erroll Luna, MD as Consulting Physician (General Surgery) Nicholas Lose, MD as Consulting Physician (Hematology and Oncology) Eppie Gibson, MD as Attending Physician (Radiation Oncology) Park Liter, MD as Consulting Physician (Cardiology) Jonnie Finner, RN as Oncology Nurse Navigator Ladell Pier, MD as Consulting Physician (Oncology)  DIAGNOSIS:    ICD-10-CM   1. Malignant neoplasm of upper-outer quadrant of right breast in female, estrogen receptor positive ([place])  C50.411    Z17.0     SUMMARY OF ONCOLOGIC HISTORY: Oncology History  Malignant neoplasm of upper-outer quadrant of right breast in female, estrogen receptor positive ([place])  06/19/2018 Initial Diagnosis   Screening mammogram detected right breast mass at 9 o'clock position 3 cm from the nipple, ultrasound revealed 7 x 5 x 5 mm mass, axillary ultrasound normal-appearing lymph nodes, ultrasound biopsy: Grade 1 IDC ER 100%, PR 10%, Ki-67 10%, HER-2 3+ positive, T1b N0 stage Ia   06/28/2018 Cancer Staging   Staging form: Breast, AJCC 8th Edition - Clinical stage from 06/28/2018: Stage IA (cT1b, cN0, cM0, G1, ER+, PR+, HER2+) - Signed by Nicholas Lose, MD on 06/28/2018   07/05/2018 Genetic Testing   Negative genetic testing on the common hereditary cancer panel.  The Hereditary Gene Panel offered by [facility] includes sequencing and/or deletion duplication testing of the following 48 genes: APC, ATM, AXIN2, BARD1, BMPR1A, BRCA1, BRCA2, BRIP1, CDH1, CDK4, CDKN2A (p14ARF), CDKN2A (p16INK4a), CHEK2, CTNNA1, DICER1, EPCAM (Deletion/duplication testing only), GREM1 (promoter region deletion/duplication testing only), KIT, MEN1, MLH1, MSH2, MSH3, MSH6, MUTYH, NBN, NF1, NHTL1, PALB2, PDGFRA, PMS2, POLD1, POLE, PTEN, RAD50, RAD51C, RAD51D, RNF43, SDHB, SDHC, SDHD, SMAD4, SMARCA4.  STK11, TP53, TSC1, TSC2, and VHL.  The following genes were evaluated for sequence changes only: SDHA and HOXB13 c.251G>A variant only. The report date is July 05, 2018.   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    08/17/2018 - 07/11/2019 Chemotherapy   Taxol Herceptin followed by Herceptin maintenance   12/05/2018 - 01/02/2019 Radiation Therapy   Adjuvant XRT   01/25/2019 -  Anti-estrogen oral therapy   Anastrozole 50m daily, plan 5-7 years   07/13/2019 Cancer Staging   Staging form: Breast, AJCC 8th Edition - Pathologic stage from 07/13/2019: Stage IA (pT1b, pN0, cM0, G2, ER+, PR+, HER2+) - Signed by CGardenia Phlegm NP on 08/01/2019   05/12/2020 -  Chemotherapy    Patient is on Treatment Plan: COLORECTAL FOLFOX Q14D X 6 MONTHS      [place]  03/13/2020 Initial Diagnosis   CT Colonoscopy: Large mass centered about the medial wall cecum and terminal ileum, favoring colonic versus less 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 Adjacent ileocolic mesenteric adenopathy, consistent with nodal metastasis. 3 mm right lower lobe pulmonary nodule   04/29/2020 Cancer Staging   Staging form: Colon and Rectum, AJCC 8th Edition - Clinical: Stage IIIC (cT3, cN2b, cM0) - Signed by SLadell Pier MD on 04/29/2020   05/12/2020 -  Chemotherapy    Patient is on Treatment Plan: COLORECTAL FOLFOX Q14D X 6 MONTHS        CHIEF COMPLIANT: Cycle 10 Folfox  INTERVAL HISTORY: DCapitola Ladsonis a 70y.o. with above-mentioned history of right breast cancer who underwent a lumpectomy, adjuvant chemotherapy,radiation,Herceptin maintenance,andis currently onanti-estrogen therapywith anastrozole.She also has a  history of severe iron deficiency anemia,and colon cancer for which she underwent surgery and iscurrently on chemotherapy with Folfox.She presents to the clinic  todayfortreatment.  ALLERGIES:  is allergic to atorvastatin and meperidine.  MEDICATIONS:  Current Outpatient Medications  Medication Sig Dispense Refill  . anastrozole (ARIMIDEX) 1 MG tablet TAKE 1 TABLET DAILY 90 tablet 3  . calcium carbonate (OSCAL) 1500 (600 Ca) MG TABS tablet Take 600 mg of elemental calcium by mouth 2 (two) times daily with a meal.    . digoxin (LANOXIN) 0.125 MG tablet Take 1 tablet (0.125 mg total) by mouth daily. 90 tablet 1  . escitalopram (LEXAPRO) 10 MG tablet Take 10 mg by mouth daily.    . flecainide (TAMBOCOR) 100 MG tablet TAKE 1 TABLET TWICE A DAY 60 tablet 0  . ibuprofen (ADVIL) 800 MG tablet Take 1 tablet (800 mg total) by mouth every 8 (eight) hours as needed. 30 tablet 0  . levothyroxine (SYNTHROID, LEVOTHROID) 100 MCG tablet Take 1 tablet by mouth daily.    Marland Kitchen lidocaine-prilocaine (EMLA) cream Apply 1 application topically as needed. Apply to [facility] ~ 1 hour prior to access 30 g 0  . lisinopril (PRINIVIL,ZESTRIL) 10 MG tablet Take 1 tablet (10 mg total) by mouth daily. 90 tablet 3  . metFORMIN (GLUCOPHAGE) 500 MG tablet Take 1 tablet by mouth 2 (two) times daily with a meal.     . metoprolol succinate (TOPROL-XL) 50 MG 24 hr tablet TAKE 1 TABLET DAILY (Patient taking differently: Take by mouth daily.) 90 tablet 3  . prochlorperazine (COMPAZINE) 10 MG tablet Take 1 tablet (10 mg total) by mouth every 6 (six) hours as needed for nausea or vomiting. 30 tablet 1  . warfarin (COUMADIN) 10 MG tablet TAKE ONE-HALF (1/2) TO ONE TABLET AS DIRECTED BY COUMADIN CLINIC 90 tablet 3   No current facility-administered medications for this visit.    PHYSICAL EXAMINATION: ECOG PERFORMANCE STATUS: 1 - Symptomatic but completely ambulatory  Vitals:   09/15/20 1126  BP: (!) 159/77  Pulse: 89  Resp: 17  Temp: 98 F (36.7 C)  SpO2: 97%   Filed Weights   09/15/20 1126  Weight: 152 lb 3.2 oz (69 kg)    LABORATORY DATA:  I have reviewed the data as listed CMP  Latest Ref Rng & Units 09/01/2020 08/18/2020 08/04/2020  Glucose 70 - 99 mg/dL 120(H) 120(H) 118(H)  BUN 8 - 23 mg/dL 10 11 11   Creatinine 0.44 - 1.00 mg/dL 0.59 0.63 0.62  Sodium 135 - 145 mmol/L 140 141 142  Potassium 3.5 - 5.1 mmol/L 3.7 3.9 4.3  Chloride 98 - 111 mmol/L 104 103 103  CO2 22 - 32 mmol/L 31 28 29   Calcium 8.9 - 10.3 mg/dL 9.4 9.3 9.9  Total Protein 6.5 - 8.1 g/dL 6.9 6.9 7.2  Total Bilirubin 0.3 - 1.2 mg/dL 0.3 0.2(L) 0.3  Alkaline Phos 38 - 126 U/L 103 105 115  AST 15 - 41 U/L 23 22 23   ALT 0 - 44 U/L 14 12 12     Lab Results  Component Value Date   WBC 4.8 09/15/2020   HGB 11.8 (L) 09/15/2020   HCT 38.7 09/15/2020   MCV 89.0 09/15/2020   PLT 89 (L) 09/15/2020   NEUTROABS 3.2 09/15/2020    ASSESSMENT & PLAN:  Malignant neoplasm of upper-outer quadrant of right breast in female, estrogen receptor positive ([place]) /05/2018:Right lumpectomy ([facility]): IDC with DCIS, 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  Treatment plan: 1.Adj chemo with Taxol-Herceptin weekly X 12completed 7/23/2020and then q 3 weeks Herceptin.Completed 07/11/2019 2.Adjuvant radiation therapystarted 12/05/2018-01/02/19 3.Followed by adjuvant antiestrogen therapywith Anastrozole in 01/2019 ----------------------------------------------------------------------------------------------------------------------------------------------- Anastrozole on hold since Colon cancer chemo was started Jan 2022 (colon cancer was diagnosed due to work up for Iron deficiency anemia) Current treatment: FOLFOX adj chemo, todayiscycle 10  Colon cancer ([place]) 1. 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 Moderately differentiated adenocarcinoma, 9/30 lymph nodes positive, lymphovascular invasion present including large vessel extramural invasion, loss of MLH1 and PMS2, MLH1 hyper methylation present ? Incomplete  colonoscopy secondary to stricturing at the sigmoid colon ? Virtual colonoscopy 03/13/2020-mass centered at the medial wall of the cecum and terminal ileum, adjacent ileocolic mesenteric adenopathy, nonspecific 3 mm right lower lobe nodule ? PET 19/91/4445 hypermetabolic cecal/terminal ileum mass with adjacent enlarged and hypermetabolic ileocolic lymph nodes, previously noted segment 7 liver lesion-not hypermetabolic-benign etiology favored, dominant left lower lobe pulmonary nodule not hypermetabolic, other tiny nodules not hypermetabolic and below PET sensitivity ? 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  Muscle spasms in the back: I sent a prescription for magnesium and also a muscle relaxant.  Part of the problem is that she is having to carry her dog everywhere since he had surgery.  This is causing stress on her back.  She is looking forward to going to her camper and spending some time with her dogs and cats. Return to clinic every 2 weeks for FOLFOX.    No orders of the defined types were placed in this encounter.  The patient has a good understanding of the overall plan. she agrees with it. she will call with any problems that may develop before the next visit here.  Total time spent: 30 mins including face to face time and time spent for planning, charting and coordination of care  Rulon Eisenmenger, MD, MPH 09/15/2020  I, Cloyde Reams Dorshimer, am acting as scribe for Dr. Nicholas Lose.  I have reviewed the above documentation for accuracy and completeness, and I agree with the above.

## 2020-09-14 NOTE — Assessment & Plan Note (Signed)
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  Treatment plan: 1.Adj chemo with Taxol-Herceptin weekly X 12completed 7/23/2020and then q 3 weeks Herceptin.Completed 07/11/2019 2.Adjuvant radiation therapystarted 12/05/2018-01/02/19 3.Followed by adjuvant antiestrogen therapywith Anastrozole in 01/2019 ----------------------------------------------------------------------------------------------------------------------------------------------- Anastrozole on hold since Colon cancer chemo was started Jan 2022 (colon cancer was diagnosed due to work up for Iron deficiency anemia) Current treatment: FOLFOX adj chemo, todayiscycle 10  Colon cancer ([place]) 1. 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 Moderately differentiated adenocarcinoma, 9/30 lymph nodes positive, lymphovascular invasion present including large vessel extramural invasion, loss of MLH1 and PMS2, MLH1 hyper methylation present ? Incomplete colonoscopy secondary to stricturing at the sigmoid colon ? Virtual colonoscopy 03/13/2020-mass centered at the medial wall of the cecum and terminal ileum, adjacent ileocolic mesenteric adenopathy, nonspecific 3 mm right lower lobe nodule ? PET 18/84/1660 hypermetabolic cecal/terminal ileum mass with adjacent enlarged and hypermetabolic ileocolic lymph nodes, previously noted segment 7 liver lesion-not hypermetabolic-benign etiology favored, dominant left lower lobe pulmonary nodule not hypermetabolic, other tiny nodules not hypermetabolic and below PET sensitivity ? Cycle 1 FOLFOX 05/12/2020   Chemo toxicities: 1.Profound cold sensitivity 2.monitoring closely for neuropathy 3.Okay to treat with a platelet count of 73 (I reduced the dosage of oxaliplatin)  Return to clinic every 2 weeks for FOLFOX.

## 2020-09-15 ENCOUNTER — Inpatient Hospital Stay: Payer: Medicare Other | Attending: Nurse Practitioner

## 2020-09-15 ENCOUNTER — Other Ambulatory Visit: Payer: Self-pay

## 2020-09-15 ENCOUNTER — Ambulatory Visit (INDEPENDENT_AMBULATORY_CARE_PROVIDER_SITE_OTHER): Payer: Medicare Other | Admitting: Cardiology

## 2020-09-15 ENCOUNTER — Inpatient Hospital Stay: Payer: Medicare Other

## 2020-09-15 ENCOUNTER — Inpatient Hospital Stay (HOSPITAL_BASED_OUTPATIENT_CLINIC_OR_DEPARTMENT_OTHER): Payer: Medicare Other | Admitting: Hematology and Oncology

## 2020-09-15 DIAGNOSIS — Z7984 Long term (current) use of oral hypoglycemic drugs: Secondary | ICD-10-CM | POA: Diagnosis not present

## 2020-09-15 DIAGNOSIS — Z5111 Encounter for antineoplastic chemotherapy: Secondary | ICD-10-CM | POA: Diagnosis not present

## 2020-09-15 DIAGNOSIS — C180 Malignant neoplasm of cecum: Secondary | ICD-10-CM | POA: Diagnosis present

## 2020-09-15 DIAGNOSIS — Z7901 Long term (current) use of anticoagulants: Secondary | ICD-10-CM | POA: Diagnosis not present

## 2020-09-15 DIAGNOSIS — Z79811 Long term (current) use of aromatase inhibitors: Secondary | ICD-10-CM | POA: Diagnosis not present

## 2020-09-15 DIAGNOSIS — Z5181 Encounter for therapeutic drug level monitoring: Secondary | ICD-10-CM

## 2020-09-15 DIAGNOSIS — Z170 Estrogen receptor positive status [ER+]: Secondary | ICD-10-CM

## 2020-09-15 DIAGNOSIS — C50411 Malignant neoplasm of upper-outer quadrant of right female breast: Secondary | ICD-10-CM

## 2020-09-15 DIAGNOSIS — Z9221 Personal history of antineoplastic chemotherapy: Secondary | ICD-10-CM | POA: Insufficient documentation

## 2020-09-15 DIAGNOSIS — Z923 Personal history of irradiation: Secondary | ICD-10-CM | POA: Insufficient documentation

## 2020-09-15 DIAGNOSIS — C182 Malignant neoplasm of ascending colon: Secondary | ICD-10-CM

## 2020-09-15 DIAGNOSIS — I480 Paroxysmal atrial fibrillation: Secondary | ICD-10-CM

## 2020-09-15 DIAGNOSIS — Z79899 Other long term (current) drug therapy: Secondary | ICD-10-CM | POA: Diagnosis not present

## 2020-09-15 DIAGNOSIS — Z95828 Presence of other vascular implants and grafts: Secondary | ICD-10-CM

## 2020-09-15 DIAGNOSIS — D500 Iron deficiency anemia secondary to blood loss (chronic): Secondary | ICD-10-CM

## 2020-09-15 LAB — CBC WITH DIFFERENTIAL (CANCER CENTER ONLY)
Abs Immature Granulocytes: 0.01 10*3/uL (ref 0.00–0.07)
Basophils Absolute: 0 10*3/uL (ref 0.0–0.1)
Basophils Relative: 0 %
Eosinophils Absolute: 0 10*3/uL (ref 0.0–0.5)
Eosinophils Relative: 0 %
HCT: 38.7 % (ref 36.0–46.0)
Hemoglobin: 11.8 g/dL — ABNORMAL LOW (ref 12.0–15.0)
Immature Granulocytes: 0 %
Lymphocytes Relative: 22 %
Lymphs Abs: 1.1 10*3/uL (ref 0.7–4.0)
MCH: 27.1 pg (ref 26.0–34.0)
MCHC: 30.5 g/dL (ref 30.0–36.0)
MCV: 89 fL (ref 80.0–100.0)
Monocytes Absolute: 0.5 10*3/uL (ref 0.1–1.0)
Monocytes Relative: 11 %
Neutro Abs: 3.2 10*3/uL (ref 1.7–7.7)
Neutrophils Relative %: 67 %
Platelet Count: 89 10*3/uL — ABNORMAL LOW (ref 150–400)
RBC: 4.35 MIL/uL (ref 3.87–5.11)
RDW: 21.6 % — ABNORMAL HIGH (ref 11.5–15.5)
WBC Count: 4.8 10*3/uL (ref 4.0–10.5)
nRBC: 0 % (ref 0.0–0.2)

## 2020-09-15 LAB — POCT INR: INR: 3.1 — AB (ref 2.0–3.0)

## 2020-09-15 LAB — CMP (CANCER CENTER ONLY)
ALT: 14 U/L (ref 0–44)
AST: 25 U/L (ref 15–41)
Albumin: 3.7 g/dL (ref 3.5–5.0)
Alkaline Phosphatase: 125 U/L (ref 38–126)
Anion gap: 9 (ref 5–15)
BUN: 12 mg/dL (ref 8–23)
CO2: 27 mmol/L (ref 22–32)
Calcium: 9.8 mg/dL (ref 8.9–10.3)
Chloride: 104 mmol/L (ref 98–111)
Creatinine: 0.62 mg/dL (ref 0.44–1.00)
GFR, Estimated: >60 mL/min (ref >60)
Glucose, Bld: 112 mg/dL — ABNORMAL HIGH (ref 70–99)
Potassium: 4.1 mmol/L (ref 3.5–5.1)
Sodium: 140 mmol/L (ref 135–145)
Total Bilirubin: 0.4 mg/dL (ref 0.3–1.2)
Total Protein: 7.3 g/dL (ref 6.5–8.1)

## 2020-09-15 LAB — PROTIME-INR
INR: 3.1 — ABNORMAL HIGH (ref 0.8–1.2)
Prothrombin Time: 31.6 seconds — ABNORMAL HIGH (ref 11.4–15.2)

## 2020-09-15 MED ORDER — FLUOROURACIL CHEMO INJECTION 2.5 GM/50ML
400.0000 mg/m2 | Freq: Once | INTRAVENOUS | Status: AC
  Administered 2020-09-15: 750 mg via INTRAVENOUS
  Filled 2020-09-15: qty 15

## 2020-09-15 MED ORDER — DEXAMETHASONE SODIUM PHOSPHATE 100 MG/10ML IJ SOLN
10.0000 mg | Freq: Once | INTRAMUSCULAR | Status: AC
  Administered 2020-09-15: 10 mg via INTRAVENOUS
  Filled 2020-09-15: qty 10

## 2020-09-15 MED ORDER — SODIUM CHLORIDE 0.9% FLUSH
10.0000 mL | Freq: Once | INTRAVENOUS | Status: DC | PRN
  Filled 2020-09-15: qty 10

## 2020-09-15 MED ORDER — TIZANIDINE HCL 2 MG PO TABS: 2.0000 mg | ORAL_TABLET | Freq: Three times a day (TID) | ORAL | 0 refills | Status: DC | PRN

## 2020-09-15 MED ORDER — LEUCOVORIN CALCIUM INJECTION 350 MG
400.0000 mg/m2 | Freq: Once | INTRAVENOUS | Status: AC
  Administered 2020-09-15: 740 mg via INTRAVENOUS
  Filled 2020-09-15: qty 37

## 2020-09-15 MED ORDER — DEXTROSE 5 % IV SOLN
Freq: Once | INTRAVENOUS | Status: AC
Start: 2020-09-15 — End: 2020-09-15
  Filled 2020-09-15: qty 250

## 2020-09-15 MED ORDER — SODIUM CHLORIDE 0.9 % IV SOLN
2400.0000 mg/m2 | INTRAVENOUS | Status: DC
  Administered 2020-09-15: 4450 mg via INTRAVENOUS
  Filled 2020-09-15: qty 89

## 2020-09-15 MED ORDER — PALONOSETRON HCL INJECTION 0.25 MG/5ML
0.2500 mg | Freq: Once | INTRAVENOUS | Status: AC
  Administered 2020-09-15: 0.25 mg via INTRAVENOUS

## 2020-09-15 MED ORDER — PALONOSETRON HCL INJECTION 0.25 MG/5ML
INTRAVENOUS | Status: AC
  Filled 2020-09-15: qty 5

## 2020-09-15 MED ORDER — OXALIPLATIN CHEMO INJECTION 100 MG/20ML
70.0000 mg/m2 | Freq: Once | INTRAVENOUS | Status: AC
  Administered 2020-09-15: 130 mg via INTRAVENOUS
  Filled 2020-09-15: qty 26

## 2020-09-15 MED ORDER — MAGNESIUM OXIDE -MG SUPPLEMENT 400 (240 MG) MG PO TABS: 400.0000 mg | ORAL_TABLET | Freq: Every day | ORAL | 0 refills | Status: DC

## 2020-09-15 NOTE — Progress Notes (Signed)
Per Dr. Geralyn Flash office visit note from today, okay to treat with PLT 89.

## 2020-09-15 NOTE — Patient Instructions (Signed)
[facility]  Discharge Instructions: ?Thank you for choosing [facility] to provide your oncology and hematology care.  ? ?If you have a lab appointment with the Cancer Center, please go directly to the Cancer Center and check in at the registration area. ?  ?Wear comfortable clothing and clothing appropriate for easy access to any Portacath or PICC line.  ? ?We strive to give you quality time with your provider. You may need to reschedule your appointment if you arrive late (15 or more minutes).  Arriving late affects you and other patients whose appointments are after yours.  Also, if you miss three or more appointments without notifying the office, you may be dismissed from the clinic at the provider?s discretion.    ?  ?For prescription refill requests, have your pharmacy contact our office and allow 72 hours for refills to be completed.   ? ?Today you received the following chemotherapy and/or immunotherapy agents: Oxaliplatin/Leucovorin/5FU.    ?  ?To help prevent nausea and vomiting after your treatment, we encourage you to take your nausea medication as directed. ? ?BELOW ARE SYMPTOMS THAT SHOULD BE REPORTED IMMEDIATELY: ?*FEVER GREATER THAN 100.4 F (38 ?C) OR HIGHER ?*CHILLS OR SWEATING ?*NAUSEA AND VOMITING THAT IS NOT CONTROLLED WITH YOUR NAUSEA MEDICATION ?*UNUSUAL SHORTNESS OF BREATH ?*UNUSUAL BRUISING OR BLEEDING ?*URINARY PROBLEMS (pain or burning when urinating, or frequent urination) ?*BOWEL PROBLEMS (unusual diarrhea, constipation, pain near the anus) ?TENDERNESS IN MOUTH AND THROAT WITH OR WITHOUT PRESENCE OF ULCERS (sore throat, sores in mouth, or a toothache) ?UNUSUAL RASH, SWELLING OR PAIN  ?UNUSUAL VAGINAL DISCHARGE OR ITCHING  ? ?Items with * indicate a potential emergency and should be followed up as soon as possible or go to the Emergency Department if any problems should occur. ? ?Please show the CHEMOTHERAPY ALERT CARD or IMMUNOTHERAPY ALERT  CARD at check-in to the Emergency Department and triage nurse. ? ?Should you have questions after your visit or need to cancel or reschedule your appointment, please contact [facility]  Dept: [phone]  and follow the prompts.  Office hours are 8:00 a.m. to 4:30 p.m. Monday - Friday. Please note that voicemails left after 4:00 p.m. may not be returned until the following business day.  We are closed weekends and major holidays. You have access to a nurse at all times for urgent questions. Please call the main number to the clinic Dept: [phone] and follow the prompts. ? ? ?For any non-urgent questions, you may also contact your provider using MyChart. We now offer e-Visits for anyone 18 and older to request care online for non-urgent symptoms. For details visit [email or web address]. ?  ?Also download the MyChart app! Go to the app store, search "MyChart", open the app, select [facility], and log in with your MyChart username and password. ? ?Due to Covid, a mask is required upon entering the hospital/clinic. If you do not have a mask, one will be given to you upon arrival. For doctor visits, patients may have 1 support person aged 18 or older with them. For treatment visits, patients cannot have anyone with them due to current Covid guidelines and our immunocompromised population.  ? ?

## 2020-09-17 ENCOUNTER — Other Ambulatory Visit: Payer: Self-pay

## 2020-09-17 ENCOUNTER — Inpatient Hospital Stay: Payer: Medicare Other

## 2020-09-17 VITALS — BP 121/70 | HR 97

## 2020-09-17 DIAGNOSIS — Z5111 Encounter for antineoplastic chemotherapy: Secondary | ICD-10-CM | POA: Diagnosis not present

## 2020-09-17 DIAGNOSIS — Z170 Estrogen receptor positive status [ER+]: Secondary | ICD-10-CM

## 2020-09-17 DIAGNOSIS — C182 Malignant neoplasm of ascending colon: Secondary | ICD-10-CM

## 2020-09-17 MED ORDER — HEPARIN SOD (PORK) LOCK FLUSH 100 UNIT/ML IV SOLN
500.0000 [IU] | Freq: Once | INTRAVENOUS | Status: AC | PRN
  Administered 2020-09-17: 500 [IU]
  Filled 2020-09-17: qty 5

## 2020-09-17 MED ORDER — SODIUM CHLORIDE 0.9% FLUSH
10.0000 mL | INTRAVENOUS | Status: DC | PRN
  Administered 2020-09-17: 10 mL
  Filled 2020-09-17: qty 10

## 2020-09-24 ENCOUNTER — Encounter: Payer: Self-pay | Admitting: Oncology

## 2020-09-27 NOTE — Progress Notes (Signed)
Patient Care Team: Patient, No Pcp Per (Inactive) as PCP - General (General Practice) Park Liter, MD as PCP - Cardiology (Cardiology) Erroll Luna, MD as Consulting Physician (General Surgery) Nicholas Lose, MD as Consulting Physician (Hematology and Oncology) Eppie Gibson, MD as Attending Physician (Radiation Oncology) Park Liter, MD as Consulting Physician (Cardiology) Jonnie Finner, RN as Oncology Nurse Navigator Ladell Pier, MD as Consulting Physician (Oncology)  DIAGNOSIS:    ICD-10-CM   1. 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[place])  C18.2       SUMMARY OF ONCOLOGIC HISTORY: Oncology History  Malignant neoplasm of upper-outer quadrant of right breast in female, estrogen receptor positive ([employer])  06/19/2018 Initial Diagnosis   Screening mammogram detected right breast mass at 9 o'clock position 3 cm from the nipple, ultrasound revealed 7 x 5 x 5 mm mass, axillary ultrasound normal-appearing lymph nodes, ultrasound biopsy: Grade 1 IDC ER 100%, PR 10%, Ki-67 10%, HER-2 3+ positive, T1b N0 stage Ia    06/28/2018 Cancer Staging   Staging form: Breast, AJCC 8th Edition - Clinical stage from 06/28/2018: Stage IA (cT1b, cN0, cM0, G1, ER+, PR+, HER2+) - Signed by Nicholas Lose, MD on 06/28/2018    07/05/2018 Genetic Testing   Negative genetic testing on the common hereditary cancer panel.  The Hereditary Gene Panel offered by [facility] includes sequencing and/or deletion duplication testing of the following 48 genes: APC, ATM, AXIN2, BARD1, BMPR1A, BRCA1, BRCA2, BRIP1, CDH1, CDK4, CDKN2A (p14ARF), CDKN2A (p16INK4a), CHEK2, CTNNA1, DICER1, EPCAM (Deletion/duplication testing only), GREM1 (promoter region deletion/duplication testing only), KIT, MEN1, MLH1, MSH2, MSH3, MSH6, MUTYH, NBN, NF1, NHTL1, PALB2, PDGFRA, PMS2, POLD1, POLE, PTEN, RAD50, RAD51C, RAD51D, RNF43, SDHB, SDHC, SDHD, SMAD4, SMARCA4. STK11, TP53, TSC1, TSC2, and VHL.  The following genes were  evaluated for sequence changes only: SDHA and HOXB13 c.251G>A variant only. The report date is July 05, 2018.    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     08/17/2018 - 07/11/2019 Chemotherapy   Taxol Herceptin followed by Herceptin maintenance   12/05/2018 - 01/02/2019 Radiation Therapy   Adjuvant XRT   01/25/2019 -  Anti-estrogen oral therapy   Anastrozole 59m daily, plan 5-7 years   07/13/2019 Cancer Staging   Staging form: Breast, AJCC 8th Edition - Pathologic stage from 07/13/2019: Stage IA (pT1b, pN0, cM0, G2, ER+, PR+, HER2+) - Signed by CGardenia Phlegm NP on 08/01/2019    05/12/2020 -  Chemotherapy    Patient is on Treatment Plan: COLORECTAL FOLFOX Q14D X 6 MONTHS       Colon cancer ([facility]  03/13/2020 Initial Diagnosis   CT Colonoscopy: Large mass centered about the medial wall cecum and terminal ileum, favoring colonic versus less 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 Adjacent ileocolic mesenteric adenopathy, consistent with nodal metastasis. 3 mm right lower lobe pulmonary nodule   04/29/2020 Cancer Staging   Staging form: Colon and Rectum, AJCC 8th Edition - Clinical: Stage IIIC (cT3, cN2b, cM0) - Signed by SLadell Pier MD on 04/29/2020    05/12/2020 -  Chemotherapy    Patient is on Treatment Plan: COLORECTAL FOLFOX Q14D X 6 MONTHS         CHIEF COMPLIANT: Cycle 11 Folfox  INTERVAL HISTORY: DTarita Deshmukhis a 70y.o. with above-mentioned history of right breast cancer who underwent a lumpectomy, adjuvant chemotherapy, radiation, Herceptin maintenance, and is currently on anti-estrogen therapy with  anastrozole. She also has a history of severe iron deficiency anemia, and colon cancer for which she underwent surgery and is currently on chemotherapy with Folfox. She presents to the clinic today for cycle 11.   ALLERGIES:  is allergic to atorvastatin and  meperidine.  MEDICATIONS:  Current Outpatient Medications  Medication Sig Dispense Refill   anastrozole (ARIMIDEX) 1 MG tablet TAKE 1 TABLET DAILY 90 tablet 3   calcium carbonate (OSCAL) 1500 (600 Ca) MG TABS tablet Take 600 mg of elemental calcium by mouth 2 (two) times daily with a meal.     digoxin (LANOXIN) 0.125 MG tablet Take 1 tablet (0.125 mg total) by mouth daily. 90 tablet 1   escitalopram (LEXAPRO) 10 MG tablet Take 10 mg by mouth daily.     flecainide (TAMBOCOR) 100 MG tablet TAKE 1 TABLET TWICE A DAY 60 tablet 0   ibuprofen (ADVIL) 800 MG tablet Take 1 tablet (800 mg total) by mouth every 8 (eight) hours as needed. 30 tablet 0   levothyroxine (SYNTHROID, LEVOTHROID) 100 MCG tablet Take 1 tablet by mouth daily.     lidocaine-prilocaine (EMLA) cream Apply 1 application topically as needed. Apply to [employer] ~ 1 hour prior to access 30 g 0   lisinopril (PRINIVIL,ZESTRIL) 10 MG tablet Take 1 tablet (10 mg total) by mouth daily. 90 tablet 3   magnesium oxide (MAG-OX) 400 (240 Mg) MG tablet Take 1 tablet (400 mg total) by mouth daily. 30 tablet 0   metFORMIN (GLUCOPHAGE) 500 MG tablet Take 1 tablet by mouth 2 (two) times daily with a meal.      metoprolol succinate (TOPROL-XL) 50 MG 24 hr tablet TAKE 1 TABLET DAILY (Patient taking differently: Take by mouth daily.) 90 tablet 3   prochlorperazine (COMPAZINE) 10 MG tablet Take 1 tablet (10 mg total) by mouth every 6 (six) hours as needed for nausea or vomiting. 30 tablet 1   tiZANidine (ZANAFLEX) 2 MG tablet Take 1 tablet (2 mg total) by mouth every 8 (eight) hours as needed for muscle spasms. 30 tablet 0   warfarin (COUMADIN) 10 MG tablet TAKE ONE-HALF (1/2) TO ONE TABLET AS DIRECTED BY COUMADIN CLINIC 90 tablet 3   No current facility-administered medications for this visit.    PHYSICAL EXAMINATION: ECOG PERFORMANCE STATUS: 1 - Symptomatic but completely ambulatory  Vitals:   09/29/20 1055  BP: 118/67  Pulse: 77  Resp: 18  Temp:  97.9 F (36.6 C)  SpO2: 95%   Filed Weights   09/29/20 1055  Weight: 151 lb 11.2 oz (68.8 kg)     LABORATORY DATA:  I have reviewed the data as listed CMP Latest Ref Rng & Units 09/15/2020 09/01/2020 08/18/2020  Glucose 70 - 99 mg/dL 112(H) 120(H) 120(H)  BUN 8 - 23 mg/dL _0 Creatinine 0.44 - 1.00 mg/dL 0.62 0.59 0.63  Sodium 135 - 145 mmol/L 140 140 141  Potassium 3.5 - 5.1 mmol/L 4.1 3.7 3.9  Chloride 98 - 111 mmol/L 104 104 103  CO2 22 - 32 mmol/L _1 Calcium 8.9 - 10.3 mg/dL 9.8 9.4 9.3  Total Protein 6.5 - 8.1 g/dL 7.3 6.9 6.9  Total Bilirubin 0.3 - 1.2 mg/dL 0.4 0.3 0.2(L)  Alkaline Phos 38 - 126 U/L 125 103 105  AST 15 - 41 U/L _2 ALT 0 - 44 U/L _3 Lab Results  Component Value Date   WBC 5.0 09/29/2020   HGB 11.3 (  L) 09/29/2020   HCT 36.5 09/29/2020   MCV 90.3 09/29/2020   PLT 75 (L) 09/29/2020   NEUTROABS 3.4 09/29/2020    ASSESSMENT & PLAN:  Colon cancer ([place]) /05/2018:Right lumpectomy ([facility]): IDC with DCIS, 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    Treatment plan: 1.  Adj chemo with Taxol-Herceptin weekly X 12 completed 11/02/2018 and then q 3 weeks Herceptin.  Completed 07/11/2019 2.  Adjuvant radiation therapy started 12/05/2018-01/02/19 3.  Followed by adjuvant antiestrogen therapy with Anastrozole in 01/2019 ----------------------------------------------------------------------------------------------------------------------------------------------- Anastrozole on hold since Colon cancer chemo was started Jan 2022 (colon cancer was diagnosed due to work up for Iron deficiency anemia) Current treatment: FOLFOX adj chemo, today is cycle 10   Colon cancer (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, stage IIIc pT3pN2b Moderately differentiated adenocarcinoma, 9/30 lymph nodes positive, lymphovascular invasion present including large vessel extramural invasion,  loss of MLH1 and PMS2, MLH1 hyper methylation present Incomplete colonoscopy secondary to stricturing at the sigmoid colon Virtual colonoscopy 03/13/2020- mass centered at the medial wall of the cecum and terminal ileum, adjacent ileocolic mesenteric adenopathy, nonspecific 3 mm right lower lobe nodule PET 20/12/4177 hypermetabolic cecal/terminal ileum mass with adjacent enlarged and hypermetabolic ileocolic lymph nodes, previously noted segment 7 liver lesion-not hypermetabolic- benign etiology favored, dominant left lower lobe pulmonary nodule not hypermetabolic, other tiny nodules not hypermetabolic and below PET sensitivity Cycle 1 FOLFOX 05/12/2020   Chemo toxicities: 1.  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  Okay to treat with a platelet count of 75  4.  Muscle spasms in the back: Magnesium and muscle relaxant did not really help her much.  She discontinued them.  The muscle spasms have resolved.   Return to clinic every 2 weeks for FOLFOX for her last treatment. She wants to keep the port for at least a year.  I discussed with her that she will need to be set up for port flushes every 2 months.   No orders of the defined types were placed in this encounter.  The patient has a good understanding of the overall plan. she agrees with it. she will call with any problems that may develop before the next visit here.  Total time spent: 30 mins including face to face time and time spent for planning, charting and coordination of care  Rulon Eisenmenger, MD, MPH 09/29/2020  I, Thana Ates, am acting as scribe for Dr. Nicholas Lose.  I have reviewed the above documentation for accuracy and completeness, and I agree with the above.

## 2020-09-28 NOTE — Assessment & Plan Note (Signed)
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  Treatment plan: 1.Adj chemo with Taxol-Herceptin weekly X 12completed 7/23/2020and then q 3 weeks Herceptin.Completed 07/11/2019 2.Adjuvant radiation therapystarted 12/05/2018-01/02/19 3.Followed by adjuvant antiestrogen therapywith Anastrozole in 01/2019 ----------------------------------------------------------------------------------------------------------------------------------------------- Anastrozole on hold since Colon cancer chemo was started Jan 2022 (colon cancer was diagnosed due to work up for Iron deficiency anemia) Current treatment: FOLFOX adj chemo, todayiscycle10  Colon cancer ([place]) 1. 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 Moderately differentiated adenocarcinoma, 9/30 lymph nodes positive, lymphovascular invasion present including large vessel extramural invasion, loss of MLH1 and PMS2, MLH1 hyper methylation present ? Incomplete colonoscopy secondary to stricturing at the sigmoid colon ? Virtual colonoscopy 03/13/2020-mass centered at the medial wall of the cecum and terminal ileum, adjacent ileocolic mesenteric adenopathy, nonspecific 3 mm right lower lobe nodule ? PET 19/91/4445 hypermetabolic cecal/terminal ileum mass with adjacent enlarged and hypermetabolic ileocolic lymph nodes, previously noted segment 7 liver lesion-not hypermetabolic-benign etiology favored, dominant left lower lobe pulmonary nodule not hypermetabolic, other tiny nodules not hypermetabolic and below PET sensitivity ? Cycle 1 FOLFOX 05/12/2020  Chemo toxicities: 1.Profound cold sensitivity 2.neuropathy: Mild to moderate: Chemo related, monitoring closely, it is stable with reduced dosage of oxaliplatin 3.Okay to treat with a platelet count of89 (I reducedthe dosage of oxaliplatin) 4.  Muscle  spasms in the back: I sent a prescription for magnesium and also a muscle relaxant.  Part of the problem is that she is having to carry her dog everywhere since he had surgery.  This is causing stress on her back.  She is looking forward to going to her camper and spending some time with her dogs and cats. Return to clinic every 2 weeks for FOLFOX.

## 2020-09-29 ENCOUNTER — Other Ambulatory Visit: Payer: Self-pay

## 2020-09-29 ENCOUNTER — Inpatient Hospital Stay (HOSPITAL_BASED_OUTPATIENT_CLINIC_OR_DEPARTMENT_OTHER): Payer: Medicare Other | Admitting: Hematology and Oncology

## 2020-09-29 ENCOUNTER — Inpatient Hospital Stay: Payer: Medicare Other

## 2020-09-29 ENCOUNTER — Other Ambulatory Visit: Payer: Self-pay | Admitting: *Deleted

## 2020-09-29 ENCOUNTER — Encounter: Payer: Self-pay | Admitting: Hematology and Oncology

## 2020-09-29 DIAGNOSIS — D500 Iron deficiency anemia secondary to blood loss (chronic): Secondary | ICD-10-CM

## 2020-09-29 DIAGNOSIS — C182 Malignant neoplasm of ascending colon: Secondary | ICD-10-CM

## 2020-09-29 DIAGNOSIS — Z5111 Encounter for antineoplastic chemotherapy: Secondary | ICD-10-CM | POA: Diagnosis not present

## 2020-09-29 DIAGNOSIS — C50411 Malignant neoplasm of upper-outer quadrant of right female breast: Secondary | ICD-10-CM

## 2020-09-29 LAB — CBC WITH DIFFERENTIAL (CANCER CENTER ONLY)
Abs Immature Granulocytes: 0.02 10*3/uL (ref 0.00–0.07)
Basophils Absolute: 0 10*3/uL (ref 0.0–0.1)
Basophils Relative: 0 %
Eosinophils Absolute: 0 10*3/uL (ref 0.0–0.5)
Eosinophils Relative: 0 %
HCT: 36.5 % (ref 36.0–46.0)
Hemoglobin: 11.3 g/dL — ABNORMAL LOW (ref 12.0–15.0)
Immature Granulocytes: 0 %
Lymphocytes Relative: 19 %
Lymphs Abs: 1 10*3/uL (ref 0.7–4.0)
MCH: 28 pg (ref 26.0–34.0)
MCHC: 31 g/dL (ref 30.0–36.0)
MCV: 90.3 fL (ref 80.0–100.0)
Monocytes Absolute: 0.6 10*3/uL (ref 0.1–1.0)
Monocytes Relative: 12 %
Neutro Abs: 3.4 10*3/uL (ref 1.7–7.7)
Neutrophils Relative %: 69 %
Platelet Count: 75 10*3/uL — ABNORMAL LOW (ref 150–400)
RBC: 4.04 MIL/uL (ref 3.87–5.11)
RDW: 20.5 % — ABNORMAL HIGH (ref 11.5–15.5)
WBC Count: 5 10*3/uL (ref 4.0–10.5)
nRBC: 0 % (ref 0.0–0.2)

## 2020-09-29 LAB — CMP (CANCER CENTER ONLY)
ALT: 18 U/L (ref 0–44)
AST: 26 U/L (ref 15–41)
Albumin: 3.9 g/dL (ref 3.5–5.0)
Alkaline Phosphatase: 95 U/L (ref 38–126)
Anion gap: 6 (ref 5–15)
BUN: 10 mg/dL (ref 8–23)
CO2: 29 mmol/L (ref 22–32)
Calcium: 9.4 mg/dL (ref 8.9–10.3)
Chloride: 104 mmol/L (ref 98–111)
Creatinine: 0.43 mg/dL — ABNORMAL LOW (ref 0.44–1.00)
GFR, Estimated: >60 mL/min (ref >60)
Glucose, Bld: 130 mg/dL — ABNORMAL HIGH (ref 70–99)
Potassium: 3.9 mmol/L (ref 3.5–5.1)
Sodium: 139 mmol/L (ref 135–145)
Total Bilirubin: 0.6 mg/dL (ref 0.3–1.2)
Total Protein: 7.2 g/dL (ref 6.5–8.1)

## 2020-09-29 LAB — PROTIME-INR
INR: 4.9 (ref 0.8–1.2)
Prothrombin Time: 45.8 seconds — ABNORMAL HIGH (ref 11.4–15.2)

## 2020-09-29 LAB — POCT INR: INR: 4.9 — AB (ref 2.0–3.0)

## 2020-09-29 MED ORDER — DEXTROSE 5 % IV SOLN
Freq: Once | INTRAVENOUS | Status: AC
  Filled 2020-09-29: qty 250

## 2020-09-29 MED ORDER — SODIUM CHLORIDE 0.9% FLUSH
10.0000 mL | INTRAVENOUS | Status: DC | PRN
  Filled 2020-09-29: qty 10

## 2020-09-29 MED ORDER — PALONOSETRON HCL INJECTION 0.25 MG/5ML
0.2500 mg | Freq: Once | INTRAVENOUS | Status: AC
  Administered 2020-09-29: 0.25 mg via INTRAVENOUS

## 2020-09-29 MED ORDER — SODIUM CHLORIDE 0.9 % IV SOLN
10.0000 mg | Freq: Once | INTRAVENOUS | Status: AC
  Administered 2020-09-29: 10 mg via INTRAVENOUS
  Filled 2020-09-29: qty 10

## 2020-09-29 MED ORDER — LEUCOVORIN CALCIUM INJECTION 350 MG
400.0000 mg/m2 | Freq: Once | INTRAVENOUS | Status: AC
  Administered 2020-09-29: 740 mg via INTRAVENOUS
  Filled 2020-09-29: qty 37

## 2020-09-29 MED ORDER — PALONOSETRON HCL INJECTION 0.25 MG/5ML
INTRAVENOUS | Status: AC
  Filled 2020-09-29: qty 5

## 2020-09-29 MED ORDER — OXALIPLATIN CHEMO INJECTION 100 MG/20ML
70.0000 mg/m2 | Freq: Once | INTRAVENOUS | Status: AC
  Administered 2020-09-29: 130 mg via INTRAVENOUS
  Filled 2020-09-29: qty 26

## 2020-09-29 MED ORDER — HEPARIN SOD (PORK) LOCK FLUSH 100 UNIT/ML IV SOLN
500.0000 [IU] | Freq: Once | INTRAVENOUS | Status: DC | PRN
  Filled 2020-09-29: qty 5

## 2020-09-29 MED ORDER — SODIUM CHLORIDE 0.9 % IV SOLN
2400.0000 mg/m2 | INTRAVENOUS | Status: DC
  Administered 2020-09-29: 4450 mg via INTRAVENOUS
  Filled 2020-09-29: qty 89

## 2020-09-29 MED ORDER — FLUOROURACIL CHEMO INJECTION 2.5 GM/50ML
400.0000 mg/m2 | Freq: Once | INTRAVENOUS | Status: AC
  Administered 2020-09-29: 750 mg via INTRAVENOUS
  Filled 2020-09-29: qty 15

## 2020-09-29 NOTE — Progress Notes (Unsigned)
Critical INR 4.9 called to Sharlynn Oliphant, RN by Prudencio Burly

## 2020-09-29 NOTE — Progress Notes (Signed)
Er Dr.Gudena, ok to treat with plts of 75 and previous CMP.

## 2020-09-29 NOTE — Patient Instructions (Signed)
[facility]  Discharge Instructions: ?Thank you for choosing [facility] to provide your oncology and hematology care.  ? ?If you have a lab appointment with the Cancer Center, please go directly to the Cancer Center and check in at the registration area. ?  ?Wear comfortable clothing and clothing appropriate for easy access to any Portacath or PICC line.  ? ?We strive to give you quality time with your provider. You may need to reschedule your appointment if you arrive late (15 or more minutes).  Arriving late affects you and other patients whose appointments are after yours.  Also, if you miss three or more appointments without notifying the office, you may be dismissed from the clinic at the provider?s discretion.    ?  ?For prescription refill requests, have your pharmacy contact our office and allow 72 hours for refills to be completed.   ? ?Today you received the following chemotherapy and/or immunotherapy agents: Oxaliplatin/Leucovorin/5FU.    ?  ?To help prevent nausea and vomiting after your treatment, we encourage you to take your nausea medication as directed. ? ?BELOW ARE SYMPTOMS THAT SHOULD BE REPORTED IMMEDIATELY: ?*FEVER GREATER THAN 100.4 F (38 ?C) OR HIGHER ?*CHILLS OR SWEATING ?*NAUSEA AND VOMITING THAT IS NOT CONTROLLED WITH YOUR NAUSEA MEDICATION ?*UNUSUAL SHORTNESS OF BREATH ?*UNUSUAL BRUISING OR BLEEDING ?*URINARY PROBLEMS (pain or burning when urinating, or frequent urination) ?*BOWEL PROBLEMS (unusual diarrhea, constipation, pain near the anus) ?TENDERNESS IN MOUTH AND THROAT WITH OR WITHOUT PRESENCE OF ULCERS (sore throat, sores in mouth, or a toothache) ?UNUSUAL RASH, SWELLING OR PAIN  ?UNUSUAL VAGINAL DISCHARGE OR ITCHING  ? ?Items with * indicate a potential emergency and should be followed up as soon as possible or go to the Emergency Department if any problems should occur. ? ?Please show the CHEMOTHERAPY ALERT CARD or IMMUNOTHERAPY ALERT  CARD at check-in to the Emergency Department and triage nurse. ? ?Should you have questions after your visit or need to cancel or reschedule your appointment, please contact [facility]  Dept: [phone]  and follow the prompts.  Office hours are 8:00 a.m. to 4:30 p.m. Monday - Friday. Please note that voicemails left after 4:00 p.m. may not be returned until the following business day.  We are closed weekends and major holidays. You have access to a nurse at all times for urgent questions. Please call the main number to the clinic Dept: [phone] and follow the prompts. ? ? ?For any non-urgent questions, you may also contact your provider using MyChart. We now offer e-Visits for anyone 18 and older to request care online for non-urgent symptoms. For details visit [email or web address]. ?  ?Also download the MyChart app! Go to the app store, search "MyChart", open the app, select [place], and log in with your MyChart username and password. ? ?Due to Covid, a mask is required upon entering the hospital/clinic. If you do not have a mask, one will be given to you upon arrival. For doctor visits, patients may have 1 support person aged 18 or older with them. For treatment visits, patients cannot have anyone with them due to current Covid guidelines and our immunocompromised population.  ? ?

## 2020-10-01 ENCOUNTER — Inpatient Hospital Stay: Payer: Medicare Other

## 2020-10-01 ENCOUNTER — Ambulatory Visit (INDEPENDENT_AMBULATORY_CARE_PROVIDER_SITE_OTHER): Payer: Medicare Other | Admitting: Cardiovascular Disease

## 2020-10-01 ENCOUNTER — Other Ambulatory Visit: Payer: Self-pay

## 2020-10-01 VITALS — BP 111/80 | HR 77 | Temp 98.7°F | Resp 18

## 2020-10-01 DIAGNOSIS — Z95828 Presence of other vascular implants and grafts: Secondary | ICD-10-CM

## 2020-10-01 DIAGNOSIS — I480 Paroxysmal atrial fibrillation: Secondary | ICD-10-CM | POA: Diagnosis not present

## 2020-10-01 DIAGNOSIS — C50411 Malignant neoplasm of upper-outer quadrant of right female breast: Secondary | ICD-10-CM

## 2020-10-01 DIAGNOSIS — Z5181 Encounter for therapeutic drug level monitoring: Secondary | ICD-10-CM | POA: Diagnosis not present

## 2020-10-01 DIAGNOSIS — Z7901 Long term (current) use of anticoagulants: Secondary | ICD-10-CM

## 2020-10-01 DIAGNOSIS — D500 Iron deficiency anemia secondary to blood loss (chronic): Secondary | ICD-10-CM

## 2020-10-01 DIAGNOSIS — Z5111 Encounter for antineoplastic chemotherapy: Secondary | ICD-10-CM | POA: Diagnosis not present

## 2020-10-01 LAB — CEA (IN HOUSE-CHCC): CEA (CHCC-In House): 4.19 ng/mL (ref 0.00–5.00)

## 2020-10-01 MED ORDER — HEPARIN SOD (PORK) LOCK FLUSH 100 UNIT/ML IV SOLN
500.0000 [IU] | Freq: Once | INTRAVENOUS | Status: AC | PRN
  Administered 2020-10-01: 500 [IU]
  Filled 2020-10-01: qty 5

## 2020-10-01 MED ORDER — SODIUM CHLORIDE 0.9% FLUSH
10.0000 mL | INTRAVENOUS | Status: DC | PRN
  Administered 2020-10-01: 10 mL via INTRAVENOUS
  Filled 2020-10-01: qty 10

## 2020-10-13 NOTE — Progress Notes (Signed)
Patient Care Team: Patient, No Pcp Per (Inactive) as PCP - General (General Practice) Park Liter, MD as PCP - Cardiology (Cardiology) Erroll Luna, MD as Consulting Physician (General Surgery) Nicholas Lose, MD as Consulting Physician (Hematology and Oncology) Eppie Gibson, MD as Attending Physician (Radiation Oncology) Park Liter, MD as Consulting Physician (Cardiology) Jonnie Finner, RN (Inactive) as Oncology Nurse Navigator Ladell Pier, MD as Consulting Physician (Oncology)  DIAGNOSIS:    ICD-10-CM   1. Malignant neoplasm of upper-outer quadrant of right breast in female, estrogen receptor positive ([place])  C50.411    Z17.0       SUMMARY OF ONCOLOGIC HISTORY: Oncology History  Malignant neoplasm of upper-outer quadrant of right breast in female, estrogen receptor positive ([place])  06/19/2018 Initial Diagnosis   Screening mammogram detected right breast mass at 9 o'clock position 3 cm from the nipple, ultrasound revealed 7 x 5 x 5 mm mass, axillary ultrasound normal-appearing lymph nodes, ultrasound biopsy: Grade 1 IDC ER 100%, PR 10%, Ki-67 10%, HER-2 3+ positive, T1b N0 stage Ia    06/28/2018 Cancer Staging   Staging form: Breast, AJCC 8th Edition - Clinical stage from 06/28/2018: Stage IA (cT1b, cN0, cM0, G1, ER+, PR+, HER2+) - Signed by Nicholas Lose, MD on 06/28/2018    07/05/2018 Genetic Testing   Negative genetic testing on the common hereditary cancer panel.  The Hereditary Gene Panel offered by [facility] includes sequencing and/or deletion duplication testing of the following 48 genes: APC, ATM, AXIN2, BARD1, BMPR1A, BRCA1, BRCA2, BRIP1, CDH1, CDK4, CDKN2A (p14ARF), CDKN2A (p16INK4a), CHEK2, CTNNA1, DICER1, EPCAM (Deletion/duplication testing only), GREM1 (promoter region deletion/duplication testing only), KIT, MEN1, MLH1, MSH2, MSH3, MSH6, MUTYH, NBN, NF1, NHTL1, PALB2, PDGFRA, PMS2, POLD1, POLE, PTEN, RAD50, RAD51C, RAD51D, RNF43, SDHB, SDHC, SDHD,  SMAD4, SMARCA4. STK11, TP53, TSC1, TSC2, and VHL.  The following genes were evaluated for sequence changes only: SDHA and HOXB13 c.251G>A variant only. The report date is July 05, 2018.    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     08/17/2018 - 07/11/2019 Chemotherapy   Taxol Herceptin followed by Herceptin maintenance   12/05/2018 - 01/02/2019 Radiation Therapy   Adjuvant XRT   01/25/2019 -  Anti-estrogen oral therapy   Anastrozole 67m daily, plan 5-7 years   07/13/2019 Cancer Staging   Staging form: Breast, AJCC 8th Edition - Pathologic stage from 07/13/2019: Stage IA (pT1b, pN0, cM0, G2, ER+, PR+, HER2+) - Signed by CGardenia Phlegm NP on 08/01/2019    05/12/2020 -  Chemotherapy    Patient is on Treatment Plan: COLORECTAL FOLFOX Q14D X 6 MONTHS       Colon cancer ([place]  03/13/2020 Initial Diagnosis   CT Colonoscopy: Large mass centered about the medial wall cecum and terminal ileum, favoring colonic versus less 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 Adjacent ileocolic mesenteric adenopathy, consistent with nodal metastasis. 3 mm right lower lobe pulmonary nodule   04/29/2020 Cancer Staging   Staging form: Colon and Rectum, AJCC 8th Edition - Clinical: Stage IIIC (cT3, cN2b, cM0) - Signed by SLadell Pier MD on 04/29/2020    05/12/2020 -  Chemotherapy    Patient is on Treatment Plan: COLORECTAL FOLFOX Q14D X 6 MONTHS         CHIEF COMPLIANT: Cycle 12 Folfox  INTERVAL HISTORY: DAlicianna Litchfordis a 70y.o. with above-mentioned history of right breast cancer who underwent a  lumpectomy, adjuvant chemotherapy, radiation, Herceptin maintenance, and is currently on anti-estrogen therapy with anastrozole. She also has a history of severe iron deficiency anemia, and colon cancer for which she underwent surgery and is currently on chemotherapy with Folfox. She presents to the clinic  today for cycle 12.  She has tolerated chemo fairly well.  ALLERGIES:  is allergic to atorvastatin and meperidine.  MEDICATIONS:  Current Outpatient Medications  Medication Sig Dispense Refill   anastrozole (ARIMIDEX) 1 MG tablet TAKE 1 TABLET DAILY 90 tablet 3   calcium carbonate (OSCAL) 1500 (600 Ca) MG TABS tablet Take 600 mg of elemental calcium by mouth 2 (two) times daily with a meal.     digoxin (LANOXIN) 0.125 MG tablet Take 1 tablet (0.125 mg total) by mouth daily. 90 tablet 1   escitalopram (LEXAPRO) 10 MG tablet Take 10 mg by mouth daily.     flecainide (TAMBOCOR) 100 MG tablet TAKE 1 TABLET TWICE A DAY 60 tablet 0   ibuprofen (ADVIL) 800 MG tablet Take 1 tablet (800 mg total) by mouth every 8 (eight) hours as needed. 30 tablet 0   levothyroxine (SYNTHROID, LEVOTHROID) 100 MCG tablet Take 1 tablet by mouth daily.     lidocaine-prilocaine (EMLA) cream Apply 1 application topically as needed. Apply to [facility] ~ 1 hour prior to access 30 g 0   lisinopril (PRINIVIL,ZESTRIL) 10 MG tablet Take 1 tablet (10 mg total) by mouth daily. 90 tablet 3   metFORMIN (GLUCOPHAGE) 500 MG tablet Take 1 tablet by mouth 2 (two) times daily with a meal.      metoprolol succinate (TOPROL-XL) 50 MG 24 hr tablet TAKE 1 TABLET DAILY (Patient taking differently: Take by mouth daily.) 90 tablet 3   prochlorperazine (COMPAZINE) 10 MG tablet Take 1 tablet (10 mg total) by mouth every 6 (six) hours as needed for nausea or vomiting. 30 tablet 1   warfarin (COUMADIN) 10 MG tablet TAKE ONE-HALF (1/2) TO ONE TABLET AS DIRECTED BY COUMADIN CLINIC 90 tablet 3   No current facility-administered medications for this visit.    PHYSICAL EXAMINATION: ECOG PERFORMANCE STATUS: 1 - Symptomatic but completely ambulatory  Vitals:   10/14/20 1117  BP: 119/66  Pulse: 69  Resp: 19  Temp: 97.7 F (36.5 C)  SpO2: 96%   Filed Weights   10/14/20 1117  Weight: 155 lb (70.3 kg)    LABORATORY DATA:  I have reviewed the data  as listed CMP Latest Ref Rng & Units 10/14/2020 09/29/2020 09/15/2020  Glucose 70 - 99 mg/dL 108(H) 130(H) 112(H)  BUN 8 - 23 mg/dL _0 Creatinine 0.44 - 1.00 mg/dL 0.61 0.43(L) 0.62  Sodium 135 - 145 mmol/L 142 139 140  Potassium 3.5 - 5.1 mmol/L 3.9 3.9 4.1  Chloride 98 - 111 mmol/L 105 104 104  CO2 22 - 32 mmol/L _1 Calcium 8.9 - 10.3 mg/dL 9.7 9.4 9.8  Total Protein 6.5 - 8.1 g/dL 7.0 7.2 7.3  Total Bilirubin 0.3 - 1.2 mg/dL 0.3 0.6 0.4  Alkaline Phos 38 - 126 U/L 118 95 125  AST 15 - 41 U/L _2 ALT 0 - 44 U/L _3 Lab Results  Component Value Date   WBC 3.6 (L) 10/14/2020   HGB 11.3 (L) 10/14/2020   HCT 35.6 (L) 10/14/2020   MCV 90.6 10/14/2020   PLT 73 (L) 10/14/2020   NEUTROABS 2.1 10/14/2020    ASSESSMENT & PLAN:  Malignant  neoplasm of upper-outer quadrant of right breast in female, estrogen receptor positive ([place]) /05/2018:Right lumpectomy ([facility]): IDC with DCIS, 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    Treatment plan: 1.  Adj chemo with Taxol-Herceptin weekly X 12 completed 11/02/2018 and then q 3 weeks Herceptin.  Completed 07/11/2019 2.  Adjuvant radiation therapy started 12/05/2018-01/02/19 3.  Followed by adjuvant antiestrogen therapy with Anastrozole in 01/2019 ----------------------------------------------------------------------------------------------------------------------------------------------- Anastrozole on hold since Colon cancer chemo was started Jan 2022 (colon cancer was diagnosed due to work up for Iron deficiency anemia) Current treatment: FOLFOX adj chemo, today is cycle 12   Colon cancer ([name]) 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, stage IIIc pT3pN2b Moderately differentiated adenocarcinoma, 9/30 lymph nodes positive, lymphovascular invasion present including large vessel extramural invasion, loss of MLH1 and PMS2, MLH1 hyper methylation  present Incomplete colonoscopy secondary to stricturing at the sigmoid colon Virtual colonoscopy 03/13/2020- mass centered at the medial wall of the cecum and terminal ileum, adjacent ileocolic mesenteric adenopathy, nonspecific 3 mm right lower lobe nodule PET 65/68/1275 hypermetabolic cecal/terminal ileum mass with adjacent enlarged and hypermetabolic ileocolic lymph nodes, previously noted segment 7 liver lesion-not hypermetabolic- benign etiology favored, dominant left lower lobe pulmonary nodule not hypermetabolic, other tiny nodules not hypermetabolic and below PET sensitivity Cycle 1 FOLFOX 05/12/2020   Chemo toxicities: 1.  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  Okay to treat with a platelet count of 73 4.  Muscle spasms in the back: Magnesium and muscle relaxant did not really help her much.  She discontinued them.  The muscle spasms have resolved.   Today's last treatment of FOLFOX She wants to keep the port for at least a year.  I discussed with her that she will need to be set up for port flushes every 2 months. She needs a CT scan of her abdomen and pelvis in January 2023.  (I ordered her scans) She may be undergoing colonoscopy towards the end of the year.  We will check labs and CEA every 3 months. She can resume anastrozole since chemotherapy is complete.  Return to clinic in 3 months for labs and I will follow her up with the telephone visit the day after the labs.  No orders of the defined types were placed in this encounter.  The patient has a good understanding of the overall plan. she agrees with it. she will call with any problems that may develop before the next visit here.  Total time spent: 30 mins including face to face time and time spent for planning, charting and coordination of care  Rulon Eisenmenger, MD, MPH 10/14/2020  I, Thana Ates, am acting as  scribe for Dr. Nicholas Lose.  I have reviewed the above documentation for accuracy and completeness, and I agree with the above.

## 2020-10-14 ENCOUNTER — Other Ambulatory Visit: Payer: Self-pay

## 2020-10-14 ENCOUNTER — Inpatient Hospital Stay (HOSPITAL_BASED_OUTPATIENT_CLINIC_OR_DEPARTMENT_OTHER): Payer: Medicare Other | Admitting: Hematology and Oncology

## 2020-10-14 ENCOUNTER — Inpatient Hospital Stay: Payer: Medicare Other | Attending: Hematology and Oncology

## 2020-10-14 ENCOUNTER — Inpatient Hospital Stay: Payer: Medicare Other

## 2020-10-14 ENCOUNTER — Ambulatory Visit (INDEPENDENT_AMBULATORY_CARE_PROVIDER_SITE_OTHER): Payer: Medicare Other | Admitting: Cardiology

## 2020-10-14 VITALS — BP 119/66 | HR 69 | Temp 97.7°F | Resp 19 | Ht 70.0 in | Wt 155.0 lb

## 2020-10-14 DIAGNOSIS — Z923 Personal history of irradiation: Secondary | ICD-10-CM | POA: Insufficient documentation

## 2020-10-14 DIAGNOSIS — C50411 Malignant neoplasm of upper-outer quadrant of right female breast: Secondary | ICD-10-CM

## 2020-10-14 DIAGNOSIS — Z9049 Acquired absence of other specified parts of digestive tract: Secondary | ICD-10-CM | POA: Insufficient documentation

## 2020-10-14 DIAGNOSIS — Z7901 Long term (current) use of anticoagulants: Secondary | ICD-10-CM | POA: Insufficient documentation

## 2020-10-14 DIAGNOSIS — C182 Malignant neoplasm of ascending colon: Secondary | ICD-10-CM | POA: Diagnosis not present

## 2020-10-14 DIAGNOSIS — Z5181 Encounter for therapeutic drug level monitoring: Secondary | ICD-10-CM | POA: Diagnosis not present

## 2020-10-14 DIAGNOSIS — Z79811 Long term (current) use of aromatase inhibitors: Secondary | ICD-10-CM | POA: Insufficient documentation

## 2020-10-14 DIAGNOSIS — I480 Paroxysmal atrial fibrillation: Secondary | ICD-10-CM | POA: Diagnosis not present

## 2020-10-14 DIAGNOSIS — Z170 Estrogen receptor positive status [ER+]: Secondary | ICD-10-CM

## 2020-10-14 DIAGNOSIS — Z79899 Other long term (current) drug therapy: Secondary | ICD-10-CM | POA: Diagnosis not present

## 2020-10-14 DIAGNOSIS — Z5111 Encounter for antineoplastic chemotherapy: Secondary | ICD-10-CM | POA: Insufficient documentation

## 2020-10-14 DIAGNOSIS — Z9221 Personal history of antineoplastic chemotherapy: Secondary | ICD-10-CM | POA: Insufficient documentation

## 2020-10-14 LAB — CBC WITH DIFFERENTIAL (CANCER CENTER ONLY)
Abs Immature Granulocytes: 0 10*3/uL (ref 0.00–0.07)
Basophils Absolute: 0 10*3/uL (ref 0.0–0.1)
Basophils Relative: 1 %
Eosinophils Absolute: 0 10*3/uL (ref 0.0–0.5)
Eosinophils Relative: 1 %
HCT: 35.6 % — ABNORMAL LOW (ref 36.0–46.0)
Hemoglobin: 11.3 g/dL — ABNORMAL LOW (ref 12.0–15.0)
Immature Granulocytes: 0 %
Lymphocytes Relative: 24 %
Lymphs Abs: 0.9 10*3/uL (ref 0.7–4.0)
MCH: 28.8 pg (ref 26.0–34.0)
MCHC: 31.7 g/dL (ref 30.0–36.0)
MCV: 90.6 fL (ref 80.0–100.0)
Monocytes Absolute: 0.6 10*3/uL (ref 0.1–1.0)
Monocytes Relative: 17 %
Neutro Abs: 2.1 10*3/uL (ref 1.7–7.7)
Neutrophils Relative %: 57 %
Platelet Count: 73 10*3/uL — ABNORMAL LOW (ref 150–400)
RBC: 3.93 MIL/uL (ref 3.87–5.11)
RDW: 19.8 % — ABNORMAL HIGH (ref 11.5–15.5)
WBC Count: 3.6 10*3/uL — ABNORMAL LOW (ref 4.0–10.5)
nRBC: 0 % (ref 0.0–0.2)

## 2020-10-14 LAB — CMP (CANCER CENTER ONLY)
ALT: 14 U/L (ref 0–44)
AST: 26 U/L (ref 15–41)
Albumin: 3.5 g/dL (ref 3.5–5.0)
Alkaline Phosphatase: 118 U/L (ref 38–126)
Anion gap: 8 (ref 5–15)
BUN: 10 mg/dL (ref 8–23)
CO2: 29 mmol/L (ref 22–32)
Calcium: 9.7 mg/dL (ref 8.9–10.3)
Chloride: 105 mmol/L (ref 98–111)
Creatinine: 0.61 mg/dL (ref 0.44–1.00)
GFR, Estimated: >60 mL/min (ref >60)
Glucose, Bld: 108 mg/dL — ABNORMAL HIGH (ref 70–99)
Potassium: 3.9 mmol/L (ref 3.5–5.1)
Sodium: 142 mmol/L (ref 135–145)
Total Bilirubin: 0.3 mg/dL (ref 0.3–1.2)
Total Protein: 7 g/dL (ref 6.5–8.1)

## 2020-10-14 LAB — PROTIME-INR
INR: 2.4 — ABNORMAL HIGH (ref 0.8–1.2)
Prothrombin Time: 25.9 seconds — ABNORMAL HIGH (ref 11.4–15.2)

## 2020-10-14 MED ORDER — PALONOSETRON HCL INJECTION 0.25 MG/5ML
0.2500 mg | Freq: Once | INTRAVENOUS | Status: AC
  Administered 2020-10-14: 0.25 mg via INTRAVENOUS

## 2020-10-14 MED ORDER — PALONOSETRON HCL INJECTION 0.25 MG/5ML
INTRAVENOUS | Status: AC
  Filled 2020-10-14: qty 5

## 2020-10-14 MED ORDER — FLUOROURACIL CHEMO INJECTION 2.5 GM/50ML
400.0000 mg/m2 | Freq: Once | INTRAVENOUS | Status: AC
  Administered 2020-10-14: 750 mg via INTRAVENOUS
  Filled 2020-10-14: qty 15

## 2020-10-14 MED ORDER — SODIUM CHLORIDE 0.9 % IV SOLN
2400.0000 mg/m2 | INTRAVENOUS | Status: DC
  Administered 2020-10-14: 4450 mg via INTRAVENOUS
  Filled 2020-10-14: qty 89

## 2020-10-14 MED ORDER — DEXTROSE 5 % IV SOLN
Freq: Once | INTRAVENOUS | Status: AC
Start: 2020-10-14 — End: 2020-10-14
  Filled 2020-10-14: qty 250

## 2020-10-14 MED ORDER — SODIUM CHLORIDE 0.9 % IV SOLN
10.0000 mg | Freq: Once | INTRAVENOUS | Status: AC
  Administered 2020-10-14: 10 mg via INTRAVENOUS
  Filled 2020-10-14: qty 1
  Filled 2020-10-14: qty 10

## 2020-10-14 MED ORDER — LEUCOVORIN CALCIUM INJECTION 350 MG
400.0000 mg/m2 | Freq: Once | INTRAVENOUS | Status: AC
  Administered 2020-10-14: 740 mg via INTRAVENOUS
  Filled 2020-10-14: qty 37

## 2020-10-14 MED ORDER — OXALIPLATIN CHEMO INJECTION 100 MG/20ML
70.0000 mg/m2 | Freq: Once | INTRAVENOUS | Status: AC
  Administered 2020-10-14: 130 mg via INTRAVENOUS
  Filled 2020-10-14: qty 20

## 2020-10-14 NOTE — Assessment & Plan Note (Signed)
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  Treatment plan: 1.Adj chemo with Taxol-Herceptin weekly X 12completed 7/23/2020and then q 3 weeks Herceptin.Completed 07/11/2019 2.Adjuvant radiation therapystarted 12/05/2018-01/02/19 3.Followed by adjuvant antiestrogen therapywith Anastrozole in 01/2019 ----------------------------------------------------------------------------------------------------------------------------------------------- Anastrozole on hold since Colon cancer chemo was started Jan 2022 (colon cancer was diagnosed due to work up for Iron deficiency anemia) Current treatment: FOLFOX adj chemo, todayiscycle12  Colon cancer ([place]) 1. 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 Moderately differentiated adenocarcinoma, 9/30 lymph nodes positive, lymphovascular invasion present including large vessel extramural invasion, loss of MLH1 and PMS2, MLH1 hyper methylation present ? Incomplete colonoscopy secondary to stricturing at the sigmoid colon ? Virtual colonoscopy 03/13/2020-mass centered at the medial wall of the cecum and terminal ileum, adjacent ileocolic mesenteric adenopathy, nonspecific 3 mm right lower lobe nodule ? PET 09/03/9100 hypermetabolic cecal/terminal ileum mass with adjacent enlarged and hypermetabolic ileocolic lymph nodes, previously noted segment 7 liver lesion-not hypermetabolic-benign etiology favored, dominant left lower lobe pulmonary nodule not hypermetabolic, other tiny nodules not hypermetabolic and below PET sensitivity ? 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  She discontinued them.  The muscle spasms have resolved.  Today's last treatment of FOLFOX She wants to keep the port for at least a year.  I discussed with her that she will need to be set up for port flushes every 2 months.  She can resume anastrozole since chemotherapy is complete.

## 2020-10-16 ENCOUNTER — Telehealth: Payer: Self-pay | Admitting: Hematology and Oncology

## 2020-10-16 ENCOUNTER — Other Ambulatory Visit: Payer: Self-pay

## 2020-10-16 ENCOUNTER — Inpatient Hospital Stay: Payer: Medicare Other

## 2020-10-16 VITALS — BP 123/66 | HR 73 | Temp 98.9°F | Resp 16

## 2020-10-16 DIAGNOSIS — Z170 Estrogen receptor positive status [ER+]: Secondary | ICD-10-CM

## 2020-10-16 DIAGNOSIS — Z5111 Encounter for antineoplastic chemotherapy: Secondary | ICD-10-CM | POA: Diagnosis not present

## 2020-10-16 DIAGNOSIS — C50411 Malignant neoplasm of upper-outer quadrant of right female breast: Secondary | ICD-10-CM

## 2020-10-16 DIAGNOSIS — C182 Malignant neoplasm of ascending colon: Secondary | ICD-10-CM

## 2020-10-16 MED ORDER — SODIUM CHLORIDE 0.9% FLUSH
10.0000 mL | INTRAVENOUS | Status: DC | PRN
  Administered 2020-10-16: 10 mL
  Filled 2020-10-16: qty 10

## 2020-10-16 MED ORDER — HEPARIN SOD (PORK) LOCK FLUSH 100 UNIT/ML IV SOLN
500.0000 [IU] | Freq: Once | INTRAVENOUS | Status: AC | PRN
  Administered 2020-10-16: 500 [IU]
  Filled 2020-10-16: qty 5

## 2020-10-16 NOTE — Telephone Encounter (Signed)
Scheduled appointment per 07/05 los. Patient will receive updated calender. 

## 2020-10-16 NOTE — Patient Instructions (Signed)

## 2020-11-17 ENCOUNTER — Other Ambulatory Visit: Payer: Self-pay | Admitting: *Deleted

## 2020-11-17 ENCOUNTER — Telehealth: Payer: Self-pay | Admitting: *Deleted

## 2020-11-17 ENCOUNTER — Telehealth: Payer: Self-pay | Admitting: Emergency Medicine

## 2020-11-17 MED ORDER — GABAPENTIN 100 MG PO CAPS: 100.0000 mg | ORAL_CAPSULE | Freq: Every day | ORAL | 2 refills | Status: DC

## 2020-11-17 NOTE — Telephone Encounter (Signed)
Notified that Dr Lindi Adie is going to order Gabapentin 100 mg to be taken at bedtime for neuropathy.

## 2020-11-17 NOTE — Telephone Encounter (Signed)
Sabrina [name] left a message stating her tingling/ neuropathy is getting worse in hands and feet. Is able to walk, but is dropping a lot of light things. Is a smoker and quite often cigarette falls out of fingers. Completed chemo 1 month.   Research nurse to call about Neuropathy Study

## 2020-11-17 NOTE — Telephone Encounter (Signed)
[id number] - TREATMENT OF ESTABLISHED CHEMOTHERAPY-INDUCED NEUROPATHY WITH N-PALMITOYLETHANOLAMIDE, A CANNABIMIMETIC NUTRACEUTICAL: A RANDOMIZED DOUBLE-BLIND PHASE II PILOT TRIAL  Pt was referred by MD Lindi Adie for neuropathy trial.  Pt is not eligible at this time as she is only 1 month out from neurotoxic chemo.  Pt and MD made aware.  Will f/u with pt closer to eligibility date if neuropathy persists.  MD Lindi Adie to start pt on gabapentin for now.  Wells Guiles '[place], RN, BSN Clinical Research Nurse I 11/17/20 12:03 PM

## 2020-11-25 ENCOUNTER — Other Ambulatory Visit: Payer: Self-pay

## 2020-11-25 ENCOUNTER — Ambulatory Visit (INDEPENDENT_AMBULATORY_CARE_PROVIDER_SITE_OTHER): Payer: Medicare Other

## 2020-11-25 DIAGNOSIS — Z5181 Encounter for therapeutic drug level monitoring: Secondary | ICD-10-CM

## 2020-11-25 DIAGNOSIS — I480 Paroxysmal atrial fibrillation: Secondary | ICD-10-CM | POA: Diagnosis not present

## 2020-11-25 DIAGNOSIS — Z7901 Long term (current) use of anticoagulants: Secondary | ICD-10-CM | POA: Diagnosis not present

## 2020-11-25 DIAGNOSIS — I4891 Unspecified atrial fibrillation: Secondary | ICD-10-CM | POA: Diagnosis not present

## 2020-11-25 LAB — POCT INR: INR: 1.8 — AB (ref 2.0–3.0)

## 2020-11-25 NOTE — Patient Instructions (Signed)
Description   Take 1 1/2 tablets ('15mg'$ ) today and then continue '10mg'$  daily except '5mg'$  on Mondays, Wednesdays and Saturdays.  Recheck INR 4 wks. [phone].

## 2020-11-26 ENCOUNTER — Encounter: Payer: Self-pay | Admitting: Cardiology

## 2020-11-26 ENCOUNTER — Ambulatory Visit (INDEPENDENT_AMBULATORY_CARE_PROVIDER_SITE_OTHER): Payer: Medicare Other | Admitting: Cardiology

## 2020-11-26 VITALS — BP 138/74 | HR 60 | Ht 70.0 in | Wt 147.2 lb

## 2020-11-26 DIAGNOSIS — I480 Paroxysmal atrial fibrillation: Secondary | ICD-10-CM

## 2020-11-26 DIAGNOSIS — C182 Malignant neoplasm of ascending colon: Secondary | ICD-10-CM | POA: Diagnosis not present

## 2020-11-26 DIAGNOSIS — Z789 Other specified health status: Secondary | ICD-10-CM

## 2020-11-26 DIAGNOSIS — F17200 Nicotine dependence, unspecified, uncomplicated: Secondary | ICD-10-CM | POA: Diagnosis not present

## 2020-11-26 DIAGNOSIS — I10 Essential (primary) hypertension: Secondary | ICD-10-CM | POA: Diagnosis not present

## 2020-11-26 NOTE — Patient Instructions (Signed)
Medication Instructions:  Your physician recommends that you continue on your current medications as directed. Please refer to the Current Medication list given to you today.  *If you need a refill on your cardiac medications before your next appointment, please call your pharmacy*   Lab Work: None If you have labs (blood work) drawn today and your tests are completely normal, you will receive your results only by: MyChart Message (if you have MyChart) OR A paper copy in the mail If you have any lab test that is abnormal or we need to change your treatment, we will call you to review the results.   Testing/Procedures: None   Follow-Up: At [facility], you and your health needs are our priority.  As part of our continuing mission to provide you with exceptional heart care, we have created designated Provider Care Teams.  These Care Teams include your primary Cardiologist (physician) and Advanced Practice Providers (APPs -  Physician Assistants and Nurse Practitioners) who all work together to provide you with the care you need, when you need it.  We recommend signing up for the patient portal called "MyChart".  Sign up information is provided on this After Visit Summary.  MyChart is used to connect with patients for Virtual Visits (Telemedicine).  Patients are able to view lab/test results, encounter notes, upcoming appointments, etc.  Non-urgent messages can be sent to your provider as well.   To learn more about what you can do with MyChart, go to [email or web address].    Your next appointment:   1 year(s)  The format for your next appointment:   In Person  Provider:   Robert Krasowski, MD   Other Instructions   

## 2020-11-26 NOTE — Progress Notes (Signed)
Cardiology Office Note:    Date:  11/26/2020   ID:  Sabrina [name], DOB 10-31-1950, MRN [id number]  PCP:  [name], NP  Cardiologist:  Jenne Campus, MD    Referring MD: No ref. provider found   Chief Complaint  Patient presents with   Follow-up  Cardiac wise and doing very well no palpitations  History of Present Illness:    Sabrina [name] is a 70 y.o. female with past medical history significant for paroxysmal atrial fibrillation, she is anticoagulant Coumadin as per her wishes.  Her rhythm is controlled with flecainide.  She also got history of breast cancer as well as colon cancer.  Recently she finished her course of chemotherapy.  Cardiac wise she appears to be doing well.  She denies have any chest pain tightness squeezing pressure burning chest there is no palpitations.  She does complain however of having some stomach aches and she is scheduled to have a CT of her abdomen this coming week.  Past Medical History:  Diagnosis Date   Atrial fibrillation ([place]) [I48.91] 11/08/2016   Benign essential hypertension 08/25/2015   Colon cancer ([place]) 03/18/2020   Diabetes ([place])    Diabetes mellitus ([place]) 06/12/2018   Dyslipidemia 02/13/2015   Dysrhythmia    AFIB   Encounter for therapeutic drug monitoring 11/08/2016   Family history of breast cancer    Family history of leukemia    Family history of prostate cancer    Genetic testing 07/05/2018   Negative genetic testing on the common hereditary cancer panel.  The Hereditary Gene Panel offered by [facility] includes sequencing and/or deletion duplication testing of the following 48 genes: APC, ATM, AXIN2, BARD1, BMPR1A, BRCA1, BRCA2, BRIP1, CDH1, CDK4, CDKN2A (p14ARF), CDKN2A (p16INK4a), CHEK2, CTNNA1, DICER1, EPCAM (Deletion/duplication testing only), GREM1 (promoter region deletion/duplicat   Hyperlipidemia    Hypothyroidism due to Hashimoto's thyroiditis 05/23/2015   Iron deficiency anemia due to chronic blood loss 12/26/2019    Long term (current) use of anticoagulants [Z79.01] 11/08/2016   Malignant neoplasm of upper-outer quadrant of right breast in female, estrogen receptor positive ([place]) 06/26/2018   Moderate episode of recurrent major depressive disorder ([place]) 12/01/2015   Paroxysmal atrial fibrillation ([place]) 02/13/2015   Formatting of this note might be different from the original. Chads score 1, chads vas 2, anticoagulated with warfarin   Personal history of chemotherapy    Personal history of radiation therapy    Port-A-Cath in place 08/24/2018   Preop cardiovascular exam 07/05/2018   Negative genetic testing on the common hereditary cancer panel.  The Hereditary Gene Panel offered by [facility] includes sequencing and/or deletion duplication testing of the following 48 genes: APC, ATM, AXIN2, BARD1, BMPR1A, BRCA1, BRCA2, BRIP1, CDH1, CDK4, CDKN2A (p14ARF), CDKN2A (p16INK4a), CHEK2, CTNNA1, DICER1, EPCAM (Deletion/duplication testing only), GREM1 (promoter region deletion/duplicat   Smoking 9/0/9311   Statin intolerance 12/08/2016   Type 2 diabetes mellitus without complication, without long-term current use of insulin ([place]) 05/23/2015    Past Surgical History:  Procedure Laterality Date   ABDOMINAL HYSTERECTOMY     APPENDECTOMY     BREAST LUMPECTOMY Right 07/13/2018   BREAST LUMPECTOMY WITH RADIOACTIVE SEED AND SENTINEL LYMPH NODE BIOPSY Right 07/13/2018   Procedure: RIGHT BREAST LUMPECTOMY WITH RADIOACTIVE SEED AND RIGHT SENTINEL LYMPH NODE MAPPING;  Surgeon: Erroll Luna, MD;  Location: [facility];  Service: General;  Laterality: Right;   BREAST SURGERY     CHOLECYSTECTOMY     KNEE SURGERY     PORT-A-CATH REMOVAL  N/A 08/21/2019   Procedure: REMOVAL PORT-A-CATH;  Surgeon: Erroll Luna, MD;  Location: [facility];  Service: General;  Laterality: N/A;   PORTACATH PLACEMENT Right 08/02/2018   Procedure: INSERTION PORT-A-CATH WITH ULTRASOUND;  Surgeon: Erroll Luna, MD;  Location:  [facility];  Service: General;  Laterality: Right;    Current Medications: Current Meds  Medication Sig   anastrozole (ARIMIDEX) 1 MG tablet TAKE 1 TABLET DAILY (Patient taking differently: Take 1 mg by mouth daily.)   calcium carbonate (OSCAL) 1500 (600 Ca) MG TABS tablet Take 600 mg of elemental calcium by mouth 2 (two) times daily with a meal.   digoxin (LANOXIN) 0.125 MG tablet Take 1 tablet (0.125 mg total) by mouth daily.   gabapentin (NEURONTIN) 100 MG capsule Take 1 capsule (100 mg total) by mouth daily.   levothyroxine (SYNTHROID, LEVOTHROID) 100 MCG tablet Take 1 tablet by mouth daily.   lisinopril (PRINIVIL,ZESTRIL) 10 MG tablet Take 1 tablet (10 mg total) by mouth daily.   metFORMIN (GLUCOPHAGE) 500 MG tablet Take 1 tablet by mouth 2 (two) times daily with a meal.    metoprolol succinate (TOPROL-XL) 50 MG 24 hr tablet TAKE 1 TABLET DAILY (Patient taking differently: Take 50 mg by mouth daily.)   warfarin (COUMADIN) 10 MG tablet TAKE ONE-HALF (1/2) TO ONE TABLET AS DIRECTED BY COUMADIN CLINIC (Patient taking differently: Take 5 mg by mouth as directed. TAKE ONE-HALF (1/2) TO ONE TABLET AS DIRECTED BY COUMADIN CLINIC)     Allergies:   Atorvastatin and Meperidine   Social History   Socioeconomic History   Marital status: Widowed    Spouse name: Not on file   Number of children: Not on file   Years of education: Not on file   Highest education level: Not on file  Occupational History   Not on file  Tobacco Use   Smoking status: Every Day    Packs/day: 2.00    Years: 40.00    Pack years: 80.00    Types: Cigarettes   Smokeless tobacco: Never  Vaping Use   Vaping Use: Never used  Substance and Sexual Activity   Alcohol use: Never   Drug use: Never   Sexual activity: Not on file  Other Topics Concern   Not on file  Social History Narrative   Not on file   Social Determinants of Health   Financial Resource Strain: Not on file  Food Insecurity: Not  on file  Transportation Needs: Not on file  Physical Activity: Not on file  Stress: Not on file  Social Connections: Not on file     Family History: The patient's family history includes Acute myelogenous leukemia in her brother; Alcohol abuse in her maternal aunt, maternal uncle, and paternal uncle; Breast cancer in her cousin and maternal aunt; Breast cancer (age of onset: 41) in her cousin; Breast cancer (age of onset: 77) in her mother; Cancer in her brother and maternal uncle; Diabetes in her maternal uncle, paternal uncle, and son; Hypertension in her maternal aunt and maternal grandmother; Prostate cancer in her father. ROS:   Please see the history of present illness.    All 14 point review of systems negative except as described per history of present illness  EKGs/Labs/Other Studies Reviewed:      Recent Labs: 10/14/2020: ALT 14; BUN 10; Creatinine 0.61; Hemoglobin 11.3; Platelet Count 73; Potassium 3.9; Sodium 142  Recent Lipid Panel No results found for: CHOL, TRIG, HDL, CHOLHDL, VLDL, LDLCALC, LDLDIRECT  Physical Exam:    VS:  BP 138/74 (BP Location: Left Arm, Patient Position: Sitting)   Pulse 60   Ht 5' 10"  (1.778 m)   Wt 147 lb 3.2 oz (66.8 kg)   SpO2 98%   BMI 21.12 kg/m     Wt Readings from Last 3 Encounters:  11/26/20 147 lb 3.2 oz (66.8 kg)  10/14/20 155 lb (70.3 kg)  09/29/20 151 lb 11.2 oz (68.8 kg)     GEN:  Well nourished, well developed in no acute distress HEENT: Normal NECK: No JVD; No carotid bruits LYMPHATICS: No lymphadenopathy CARDIAC: RRR, no murmurs, no rubs, no gallops RESPIRATORY:  Clear to auscultation without rales, wheezing or rhonchi  ABDOMEN: Soft, non-tender, non-distended MUSCULOSKELETAL:  No edema; No deformity  SKIN: Warm and dry LOWER EXTREMITIES: no swelling NEUROLOGIC:  Alert and oriented x 3 PSYCHIATRIC:  Normal affect   ASSESSMENT:    1. Paroxysmal atrial fibrillation (HCC)   2. Benign essential hypertension   3.  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HCC)   4. Smoking   5. Statin intolerance    PLAN:    In order of problems listed above:  Paroxysmal atrial fibrillation maintaining sinus rhythm she is on Coumadin as per her wishes, she is on flecainide QRS complex duration morphology is normal, QT interval is normal.  We will continue.  Denies having any reoccurrences. Benign essential hypertension blood pressure well controlled continue present management. Smoking sadly still ongoing and again I brought the issue of potentially quitting however she tells me she is more concerned but her counseled on smoking. Statin intolerance.  Ongoing problem.  I do not have a list of his fasting lipid profile we will call primary care physician to get a copy of it.  Again she is more concerned about her cancer rate noted cholesterol which is understandable. I wanted to see her back in about 6 months he prefers 1 year appointment which we will do however ask her to let me know if you have any cardiac issues.  I did review note from oncologist   Medication Adjustments/Labs and Tests Ordered: Current medicines are reviewed at length with the patient today.  Concerns regarding medicines are outlined above.  No orders of the defined types were placed in this encounter.  Medication changes: No orders of the defined types were placed in this encounter.   Signed, Park Liter, MD, [facility] 11/26/2020 1:34 PM    [place]

## 2020-12-12 ENCOUNTER — Other Ambulatory Visit: Payer: Self-pay | Admitting: *Deleted

## 2020-12-12 DIAGNOSIS — C50411 Malignant neoplasm of upper-outer quadrant of right female breast: Secondary | ICD-10-CM

## 2020-12-12 DIAGNOSIS — Z170 Estrogen receptor positive status [ER+]: Secondary | ICD-10-CM

## 2020-12-12 NOTE — Progress Notes (Signed)
Received call from Dr. Leitha Schuller office regarding new findings on recent CT CAP.  Office states copy of report will be faxed to our office and pt needs to f/u with MD urgently.  Appointment scheduled and pt verbalized understanding of date and time.

## 2020-12-14 NOTE — Progress Notes (Signed)
Patient Care Team: [name], NP as PCP - General (Family Medicine) Park Liter, MD as PCP - Cardiology (Cardiology) Erroll Luna, MD as Consulting Physician (General Surgery) Nicholas Lose, MD as Consulting Physician (Hematology and Oncology) Eppie Gibson, MD as Attending Physician (Radiation Oncology) Park Liter, MD as Consulting Physician (Cardiology) Jonnie Finner, RN (Inactive) as Oncology Nurse Navigator Ladell Pier, MD as Consulting Physician (Oncology)  DIAGNOSIS:    ICD-10-CM   1. 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[place])  C18.2     2. Malignant neoplasm of upper-outer quadrant of right breast in female, estrogen receptor positive ([place])  C50.411    Z17.0       SUMMARY OF ONCOLOGIC HISTORY: Oncology History  Malignant neoplasm of upper-outer quadrant of right breast in female, estrogen receptor positive ([place])  06/19/2018 Initial Diagnosis   Screening mammogram detected right breast mass at 9 o'clock position 3 cm from the nipple, ultrasound revealed 7 x 5 x 5 mm mass, axillary ultrasound normal-appearing lymph nodes, ultrasound biopsy: Grade 1 IDC ER 100%, PR 10%, Ki-67 10%, HER-2 3+ positive, T1b N0 stage Ia   06/28/2018 Cancer Staging   Staging form: Breast, AJCC 8th Edition - Clinical stage from 06/28/2018: Stage IA (cT1b, cN0, cM0, G1, ER+, PR+, HER2+) - Signed by Nicholas Lose, MD on 06/28/2018   07/05/2018 Genetic Testing   Negative genetic testing on the common hereditary cancer panel.  The Hereditary Gene Panel offered by [facility] includes sequencing and/or deletion duplication testing of the following 48 genes: APC, ATM, AXIN2, BARD1, BMPR1A, BRCA1, BRCA2, BRIP1, CDH1, CDK4, CDKN2A (p14ARF), CDKN2A (p16INK4a), CHEK2, CTNNA1, DICER1, EPCAM (Deletion/duplication testing only), GREM1 (promoter region deletion/duplication testing only), KIT, MEN1, MLH1, MSH2, MSH3, MSH6, MUTYH, NBN, NF1, NHTL1, PALB2, PDGFRA, PMS2, POLD1, POLE, PTEN,  RAD50, RAD51C, RAD51D, RNF43, SDHB, SDHC, SDHD, SMAD4, SMARCA4. STK11, TP53, TSC1, TSC2, and VHL.  The following genes were evaluated for sequence changes only: SDHA and HOXB13 c.251G>A variant only. The report date is July 05, 2018.   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    08/17/2018 - 07/11/2019 Chemotherapy   Taxol Herceptin followed by Herceptin maintenance   12/05/2018 - 01/02/2019 Radiation Therapy   Adjuvant XRT   01/25/2019 -  Anti-estrogen oral therapy   Anastrozole 64m daily, plan 5-7 years   07/13/2019 Cancer Staging   Staging form: Breast, AJCC 8th Edition - Pathologic stage from 07/13/2019: Stage IA (pT1b, pN0, cM0, G2, ER+, PR+, HER2+) - Signed by CGardenia Phlegm NP on 08/01/2019   05/12/2020 -  Chemotherapy    Patient is on Treatment Plan: COLORECTAL FOLFOX Q14D X 6 MONTHS       Colon cancer ([place]  03/13/2020 Initial Diagnosis   CT Colonoscopy: Large mass centered about the medial wall cecum and terminal ileum, favoring colonic versus less 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 Adjacent ileocolic mesenteric adenopathy, consistent with nodal metastasis. 3 mm right lower lobe pulmonary nodule   04/29/2020 Cancer Staging   Staging form: Colon and Rectum, AJCC 8th Edition - Clinical: Stage IIIC (cT3, cN2b, cM0) - Signed by SLadell Pier MD on 04/29/2020   05/12/2020 -  Chemotherapy    Patient is on Treatment Plan: COLORECTAL FOLFOX Q14D X 6 MONTHS         CHIEF COMPLIANT: Follow-up of right breast cancer  INTERVAL HISTORY: Sabrina Mischkeis a 70y.o. with above-mentioned  history of right breast cancer who underwent a lumpectomy, adjuvant chemotherapy, radiation, Herceptin maintenance, and is currently on anti-estrogen therapy with anastrozole. She also has a history of severe iron deficiency anemia, and colon cancer for which she underwent surgery and chemotherapy  with FOLFOX. She presents to the clinic today for follow-up.   ALLERGIES:  is allergic to atorvastatin and meperidine.  MEDICATIONS:  Current Outpatient Medications  Medication Sig Dispense Refill   anastrozole (ARIMIDEX) 1 MG tablet TAKE 1 TABLET DAILY (Patient taking differently: Take 1 mg by mouth daily.) 90 tablet 3   calcium carbonate (OSCAL) 1500 (600 Ca) MG TABS tablet Take 600 mg of elemental calcium by mouth 2 (two) times daily with a meal.     digoxin (LANOXIN) 0.125 MG tablet Take 1 tablet (0.125 mg total) by mouth daily. 90 tablet 1   escitalopram (LEXAPRO) 10 MG tablet Take 10 mg by mouth daily. (Patient not taking: Reported on 11/26/2020)     flecainide (TAMBOCOR) 100 MG tablet TAKE 1 TABLET TWICE A DAY (Patient not taking: Reported on 11/26/2020) 60 tablet 0   gabapentin (NEURONTIN) 100 MG capsule Take 1 capsule (100 mg total) by mouth daily. 30 capsule 2   ibuprofen (ADVIL) 800 MG tablet Take 1 tablet (800 mg total) by mouth every 8 (eight) hours as needed. (Patient not taking: Reported on 11/26/2020) 30 tablet 0   levothyroxine (SYNTHROID, LEVOTHROID) 100 MCG tablet Take 1 tablet by mouth daily.     lidocaine-prilocaine (EMLA) cream Apply 1 application topically as needed. Apply to [facility] ~ 1 hour prior to access (Patient not taking: Reported on 11/26/2020) 30 g 0   lisinopril (PRINIVIL,ZESTRIL) 10 MG tablet Take 1 tablet (10 mg total) by mouth daily. 90 tablet 3   metFORMIN (GLUCOPHAGE) 500 MG tablet Take 1 tablet by mouth 2 (two) times daily with a meal.      metoprolol succinate (TOPROL-XL) 50 MG 24 hr tablet TAKE 1 TABLET DAILY (Patient taking differently: Take 50 mg by mouth daily.) 90 tablet 3   prochlorperazine (COMPAZINE) 10 MG tablet Take 1 tablet (10 mg total) by mouth every 6 (six) hours as needed for nausea or vomiting. (Patient not taking: Reported on 11/26/2020) 30 tablet 1   warfarin (COUMADIN) 10 MG tablet TAKE ONE-HALF (1/2) TO ONE TABLET AS DIRECTED BY COUMADIN CLINIC  (Patient taking differently: Take 5 mg by mouth as directed. TAKE ONE-HALF (1/2) TO ONE TABLET AS DIRECTED BY COUMADIN CLINIC) 90 tablet 3   No current facility-administered medications for this visit.    PHYSICAL EXAMINATION: ECOG PERFORMANCE STATUS: 1 - Symptomatic but completely ambulatory  Vitals:   12/16/20 1427  BP: (!) 152/74  Pulse: 87  Resp: 18  Temp: 97.6 F (36.4 C)  SpO2: 96%   Filed Weights   12/16/20 1427  Weight: 143 lb 11.2 oz (65.2 kg)      LABORATORY DATA:  I have reviewed the data as listed CMP Latest Ref Rng & Units 10/14/2020 09/29/2020 09/15/2020  Glucose 70 - 99 mg/dL 108(H) 130(H) 112(H)  BUN 8 - 23 mg/dL _0 Creatinine 0.44 - 1.00 mg/dL 0.61 0.43(L) 0.62  Sodium 135 - 145 mmol/L 142 139 140  Potassium 3.5 - 5.1 mmol/L 3.9 3.9 4.1  Chloride 98 - 111 mmol/L 105 104 104  CO2 22 - 32 mmol/L _1 Calcium 8.9 - 10.3 mg/dL 9.7 9.4 9.8  Total Protein 6.5 - 8.1 g/dL 7.0 7.2 7.3  Total Bilirubin 0.3 -  1.2 mg/dL 0.3 0.6 0.4  Alkaline Phos 38 - 126 U/L 118 95 125  AST 15 - 41 U/L _0 ALT 0 - 44 U/L _1 Lab Results  Component Value Date   WBC 6.2 12/16/2020   HGB 13.7 12/16/2020   HCT 42.3 12/16/2020   MCV 91.4 12/16/2020   PLT 106 (L) 12/16/2020   NEUTROABS 4.3 12/16/2020    ASSESSMENT & PLAN:  Colon cancer ([place]) Colon cancer (HCC) 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, stage IIIc pT3pN2b Moderately differentiated adenocarcinoma, 9/30 lymph nodes positive, lymphovascular invasion present including large vessel extramural invasion, loss of MLH1 and PMS2, MLH1 hyper methylation present Incomplete colonoscopy secondary to stricturing at the sigmoid colon Virtual colonoscopy 03/13/2020- mass centered at the medial wall of the cecum and terminal ileum, adjacent ileocolic mesenteric adenopathy, nonspecific 3 mm right lower lobe nodule PET 39/53/2023 hypermetabolic cecal/terminal ileum mass with adjacent enlarged  and hypermetabolic ileocolic lymph nodes, previously noted segment 7 liver lesion-not hypermetabolic- benign etiology favored, dominant left lower lobe pulmonary nodule not hypermetabolic, other tiny nodules not hypermetabolic and below PET sensitivity Cycle 1 FOLFOX 05/12/2020 completed cycle 12 on 10/14/2020   Colon cancer surveillance: Labs and CEA every 6 months and CT scans in 1 year January 2023.  Also colonoscopy at the end of the year  2. CT abdomen 12/03/2020: 3.7 cm liver lesion suspicious for metastatic disease.  Previously noted liver lesion unchanged, conglomerate of porta hepatis necrotic appearing lymph nodes largest measuring 2.3 cm  We will plan for PET CT scan, CEA is pending from today, ultrasound-guided liver biopsy will be ordered. Return to clinic after biopsy to discuss pathology report and discuss additional treatment options. If this is a solitary site of metastatic disease, she may be eligible for liver resection.   No orders of the defined types were placed in this encounter.  The patient has a good understanding of the overall plan. she agrees with it. she will call with any problems that may develop before the next visit here.  Total time spent: 30 mins including face to face time and time spent for planning, charting and coordination of care  Rulon Eisenmenger, MD, MPH 12/16/2020  I, Thana Ates, am acting as scribe for Dr. Nicholas Lose.  I have reviewed the above documentation for accuracy and completeness, and I agree with the above.

## 2020-12-16 ENCOUNTER — Inpatient Hospital Stay: Payer: Medicare Other | Attending: Nurse Practitioner | Admitting: Hematology and Oncology

## 2020-12-16 ENCOUNTER — Other Ambulatory Visit: Payer: Self-pay

## 2020-12-16 ENCOUNTER — Inpatient Hospital Stay: Payer: Medicare Other

## 2020-12-16 ENCOUNTER — Encounter (HOSPITAL_COMMUNITY): Payer: Self-pay | Admitting: Radiology

## 2020-12-16 ENCOUNTER — Telehealth: Payer: Self-pay | Admitting: *Deleted

## 2020-12-16 VITALS — BP 152/74 | HR 87 | Temp 97.6°F | Resp 18 | Ht 70.0 in | Wt 143.7 lb

## 2020-12-16 DIAGNOSIS — Z170 Estrogen receptor positive status [ER+]: Secondary | ICD-10-CM

## 2020-12-16 DIAGNOSIS — I4891 Unspecified atrial fibrillation: Secondary | ICD-10-CM | POA: Insufficient documentation

## 2020-12-16 DIAGNOSIS — Z7984 Long term (current) use of oral hypoglycemic drugs: Secondary | ICD-10-CM | POA: Diagnosis not present

## 2020-12-16 DIAGNOSIS — D508 Other iron deficiency anemias: Secondary | ICD-10-CM | POA: Insufficient documentation

## 2020-12-16 DIAGNOSIS — C50411 Malignant neoplasm of upper-outer quadrant of right female breast: Secondary | ICD-10-CM

## 2020-12-16 DIAGNOSIS — Z923 Personal history of irradiation: Secondary | ICD-10-CM | POA: Insufficient documentation

## 2020-12-16 DIAGNOSIS — Z7901 Long term (current) use of anticoagulants: Secondary | ICD-10-CM | POA: Insufficient documentation

## 2020-12-16 DIAGNOSIS — Z79899 Other long term (current) drug therapy: Secondary | ICD-10-CM | POA: Insufficient documentation

## 2020-12-16 DIAGNOSIS — Z79811 Long term (current) use of aromatase inhibitors: Secondary | ICD-10-CM | POA: Diagnosis not present

## 2020-12-16 DIAGNOSIS — E119 Type 2 diabetes mellitus without complications: Secondary | ICD-10-CM | POA: Diagnosis not present

## 2020-12-16 DIAGNOSIS — Z95828 Presence of other vascular implants and grafts: Secondary | ICD-10-CM

## 2020-12-16 DIAGNOSIS — C182 Malignant neoplasm of ascending colon: Secondary | ICD-10-CM | POA: Diagnosis not present

## 2020-12-16 DIAGNOSIS — E039 Hypothyroidism, unspecified: Secondary | ICD-10-CM | POA: Diagnosis not present

## 2020-12-16 DIAGNOSIS — C180 Malignant neoplasm of cecum: Secondary | ICD-10-CM | POA: Diagnosis not present

## 2020-12-16 DIAGNOSIS — Z9221 Personal history of antineoplastic chemotherapy: Secondary | ICD-10-CM | POA: Diagnosis not present

## 2020-12-16 DIAGNOSIS — C787 Secondary malignant neoplasm of liver and intrahepatic bile duct: Secondary | ICD-10-CM | POA: Diagnosis present

## 2020-12-16 LAB — CMP (CANCER CENTER ONLY)
ALT: 16 U/L (ref 0–44)
AST: 28 U/L (ref 15–41)
Albumin: 4 g/dL (ref 3.5–5.0)
Alkaline Phosphatase: 130 U/L — ABNORMAL HIGH (ref 38–126)
Anion gap: 10 (ref 5–15)
BUN: 8 mg/dL (ref 8–23)
CO2: 28 mmol/L (ref 22–32)
Calcium: 9.9 mg/dL (ref 8.9–10.3)
Chloride: 102 mmol/L (ref 98–111)
Creatinine: 0.66 mg/dL (ref 0.44–1.00)
GFR, Estimated: >60 mL/min (ref >60)
Glucose, Bld: 117 mg/dL — ABNORMAL HIGH (ref 70–99)
Potassium: 4 mmol/L (ref 3.5–5.1)
Sodium: 140 mmol/L (ref 135–145)
Total Bilirubin: 0.3 mg/dL (ref 0.3–1.2)
Total Protein: 7.7 g/dL (ref 6.5–8.1)

## 2020-12-16 LAB — CEA (IN HOUSE-CHCC): CEA (CHCC-In House): 1.4 ng/mL (ref 0.00–5.00)

## 2020-12-16 LAB — CBC WITH DIFFERENTIAL (CANCER CENTER ONLY)
Abs Immature Granulocytes: 0.01 10*3/uL (ref 0.00–0.07)
Basophils Absolute: 0 10*3/uL (ref 0.0–0.1)
Basophils Relative: 1 %
Eosinophils Absolute: 0.1 10*3/uL (ref 0.0–0.5)
Eosinophils Relative: 1 %
HCT: 42.3 % (ref 36.0–46.0)
Hemoglobin: 13.7 g/dL (ref 12.0–15.0)
Immature Granulocytes: 0 %
Lymphocytes Relative: 19 %
Lymphs Abs: 1.2 10*3/uL (ref 0.7–4.0)
MCH: 29.6 pg (ref 26.0–34.0)
MCHC: 32.4 g/dL (ref 30.0–36.0)
MCV: 91.4 fL (ref 80.0–100.0)
Monocytes Absolute: 0.6 10*3/uL (ref 0.1–1.0)
Monocytes Relative: 10 %
Neutro Abs: 4.3 10*3/uL (ref 1.7–7.7)
Neutrophils Relative %: 69 %
Platelet Count: 106 10*3/uL — ABNORMAL LOW (ref 150–400)
RBC: 4.63 MIL/uL (ref 3.87–5.11)
RDW: 15.9 % — ABNORMAL HIGH (ref 11.5–15.5)
WBC Count: 6.2 10*3/uL (ref 4.0–10.5)
nRBC: 0 % (ref 0.0–0.2)

## 2020-12-16 MED ORDER — SODIUM CHLORIDE 0.9% FLUSH
10.0000 mL | INTRAVENOUS | Status: AC | PRN
  Administered 2020-12-16: 10 mL

## 2020-12-16 MED ORDER — HEPARIN SOD (PORK) LOCK FLUSH 100 UNIT/ML IV SOLN
500.0000 [IU] | INTRAVENOUS | Status: AC | PRN
  Administered 2020-12-16: 500 [IU]

## 2020-12-16 MED ORDER — GABAPENTIN 300 MG PO CAPS: 300.0000 mg | ORAL_CAPSULE | Freq: Two times a day (BID) | ORAL | 3 refills | Status: DC

## 2020-12-16 NOTE — Assessment & Plan Note (Signed)
Colon cancer ([place]) 1. 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 Moderately differentiated adenocarcinoma, 9/30 lymph nodes positive, lymphovascular invasion present including large vessel extramural invasion, loss of MLH1 and PMS2, MLH1 hyper methylation present ? Incomplete colonoscopy secondary to stricturing at the sigmoid colon ? Virtual colonoscopy 03/13/2020-mass centered at the medial wall of the cecum and terminal ileum, adjacent ileocolic mesenteric adenopathy, nonspecific 3 mm right lower lobe nodule ? PET 23/30/0762 hypermetabolic cecal/terminal ileum mass with adjacent enlarged and hypermetabolic ileocolic lymph nodes, previously noted segment 7 liver lesion-not hypermetabolic-benign etiology favored, dominant left lower lobe pulmonary nodule not hypermetabolic, other tiny nodules not hypermetabolic and below PET sensitivity ? Cycle 1 FOLFOX 05/12/2020 completed cycle 12 on 10/14/2020   Colon cancer surveillance: Labs and CEA every 6 months and CT scans in 1 year January 2023.  Also colonoscopy at the end of the year

## 2020-12-16 NOTE — Assessment & Plan Note (Signed)
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  Treatment plan: 1.Adj chemo with Taxol-Herceptin weekly X 12completed 7/23/2020and then q 3 weeks Herceptin.Completed 07/11/2019 2.Adjuvant radiation therapystarted 12/05/2018-01/02/19 3.Followed by adjuvant antiestrogen therapywith Anastrozole in 01/2019 (held for 6 months for colon cancer chemotherapy) resumed and 12/16/2020 ----------------------------------------------------------------------------------------------------------------------------------------------- Anastrozole on hold since Colon cancer chemo was started Jan 2022 (colon cancer was diagnosed due to work up for Iron deficiency anemia) Resume anastrozole since chemotherapy is complete

## 2020-12-16 NOTE — Telephone Encounter (Signed)
RN attempt x1 to contact pt regarding apt for PET scan.  No answer.  LVM for pt to return call to the office.

## 2020-12-16 NOTE — Progress Notes (Signed)
Patient Name  [name], Sabrina Legal Sex  Female DOB  [birth year] SSN  [id number] Address  [place] [address] Phone  [phone] [facility])  [phone] (Mobile) *Preferred*    RE: US Liver Biopsy Received: Today Arne Cleveland, MD  [place], Jerimiah Wolman D Ok   [place] core R liver met   DDH        Previous Messages   ----- Message -----  From: Garth Bigness D  Sent: 12/16/2020   3:07 PM EDT  To: Ir Procedure Requests  Subject: US Liver Biopsy                                 Procedure:  US Liver Biopsy   Reason:  Malignant neoplasm of ascending colon, Malignant neoplasm of upper-outer quadrant of right breast in female, estrogen receptor positive, Evaluate for met colon cancer/liver lesion on ct    History:  outside imaging in PAC's   Provider:  Nicholas Lose   Provider Contact:  [phone]

## 2020-12-17 ENCOUNTER — Encounter (HOSPITAL_COMMUNITY): Payer: Self-pay | Admitting: Radiology

## 2020-12-17 ENCOUNTER — Telehealth: Payer: Self-pay | Admitting: Hematology and Oncology

## 2020-12-17 NOTE — Telephone Encounter (Signed)
Scheduled per 9/6 los. Called pt and left a msg

## 2020-12-17 NOTE — Progress Notes (Signed)
Patient Name  Sabrina [name], Sabrina [name] Legal Sex  Female DOB  [birth year] SSN  [id number] Address  [place] [address] Phone  [phone] [facility])  [phone] (Mobile) *Preferred*    RE: US Liver Biopsy Received: Today Park Liter, MD  Garth Bigness D Should be fine to hold Coumadin for 4 days        Previous Messages   ----- Message -----  From: Garth Bigness D  Sent: 12/16/2020   3:09 PM EDT  To: Park Liter, MD  Subject: FW: US Liver Biopsy                             Good afternoon, there was an order placed by Dr Lindi Adie for patient to have a liver biopsy done. I see that patient is on Coumadin. Patient will need to hold for 4 days prior to biopsy. Please advise if okay to hold for those 4 days. Thanks Aniceto Boss  ----- Message -----  From: Garth Bigness D  Sent: 12/16/2020   3:07 PM EDT  To: Ir Procedure Requests  Subject: US Liver Biopsy                                 Procedure:  US Liver Biopsy   Reason:  Malignant neoplasm of ascending colon, Malignant neoplasm of upper-outer quadrant of right breast in female, estrogen receptor positive, Evaluate for met colon cancer/liver lesion on ct    History:  outside imaging in PAC's   Provider:  Nicholas Lose   Provider Contact:  [phone]

## 2020-12-18 ENCOUNTER — Telehealth: Payer: Self-pay

## 2020-12-18 NOTE — Telephone Encounter (Signed)
Pt called and stated that they needed to cancel their coumadin appt as they will be having a biopsy on 9/12 and they were instructed to stop coumadin for 4 days.  Will route to the pharmd pool to make them aware.

## 2020-12-18 NOTE — Telephone Encounter (Signed)
Called and informed the pt that they need to r/s for the 9/20 date at [facility] but they stated that they would rather call back to r/s

## 2020-12-18 NOTE — Telephone Encounter (Signed)
Patient has CHADS2- VASc socre of 4, okay to hold x 4 days.  Please have patient re-schedule for Sept 20.

## 2020-12-19 ENCOUNTER — Other Ambulatory Visit: Payer: Self-pay | Admitting: Student

## 2020-12-22 ENCOUNTER — Other Ambulatory Visit: Payer: Self-pay

## 2020-12-22 ENCOUNTER — Encounter (HOSPITAL_COMMUNITY): Payer: Self-pay

## 2020-12-22 ENCOUNTER — Ambulatory Visit (HOSPITAL_COMMUNITY)
Admission: RE | Admit: 2020-12-22 | Discharge: 2020-12-22 | Disposition: A | Discharge status: Home / Self Care | Payer: Medicare Other | Source: Ambulatory Visit | Attending: Hematology and Oncology | Admitting: Hematology and Oncology

## 2020-12-22 DIAGNOSIS — Z853 Personal history of malignant neoplasm of breast: Secondary | ICD-10-CM | POA: Diagnosis not present

## 2020-12-22 DIAGNOSIS — Z79899 Other long term (current) drug therapy: Secondary | ICD-10-CM | POA: Diagnosis not present

## 2020-12-22 DIAGNOSIS — C50411 Malignant neoplasm of upper-outer quadrant of right female breast: Secondary | ICD-10-CM

## 2020-12-22 DIAGNOSIS — Z85038 Personal history of other malignant neoplasm of large intestine: Secondary | ICD-10-CM | POA: Diagnosis not present

## 2020-12-22 DIAGNOSIS — I480 Paroxysmal atrial fibrillation: Secondary | ICD-10-CM | POA: Diagnosis not present

## 2020-12-22 DIAGNOSIS — Z923 Personal history of irradiation: Secondary | ICD-10-CM | POA: Diagnosis not present

## 2020-12-22 DIAGNOSIS — Z7901 Long term (current) use of anticoagulants: Secondary | ICD-10-CM | POA: Insufficient documentation

## 2020-12-22 DIAGNOSIS — Z9221 Personal history of antineoplastic chemotherapy: Secondary | ICD-10-CM | POA: Insufficient documentation

## 2020-12-22 DIAGNOSIS — F17210 Nicotine dependence, cigarettes, uncomplicated: Secondary | ICD-10-CM | POA: Insufficient documentation

## 2020-12-22 DIAGNOSIS — K769 Liver disease, unspecified: Secondary | ICD-10-CM | POA: Diagnosis present

## 2020-12-22 DIAGNOSIS — E119 Type 2 diabetes mellitus without complications: Secondary | ICD-10-CM | POA: Diagnosis not present

## 2020-12-22 DIAGNOSIS — C787 Secondary malignant neoplasm of liver and intrahepatic bile duct: Secondary | ICD-10-CM | POA: Diagnosis not present

## 2020-12-22 DIAGNOSIS — C182 Malignant neoplasm of ascending colon: Secondary | ICD-10-CM

## 2020-12-22 DIAGNOSIS — E785 Hyperlipidemia, unspecified: Secondary | ICD-10-CM | POA: Insufficient documentation

## 2020-12-22 LAB — CBC
HCT: 44.1 % (ref 36.0–46.0)
Hemoglobin: 13.8 g/dL (ref 12.0–15.0)
MCH: 29 pg (ref 26.0–34.0)
MCHC: 31.3 g/dL (ref 30.0–36.0)
MCV: 92.6 fL (ref 80.0–100.0)
Platelets: 117 10*3/uL — ABNORMAL LOW (ref 150–400)
RBC: 4.76 MIL/uL (ref 3.87–5.11)
RDW: 15.5 % (ref 11.5–15.5)
WBC: 5.5 10*3/uL (ref 4.0–10.5)
nRBC: 0 % (ref 0.0–0.2)

## 2020-12-22 LAB — PROTIME-INR
INR: 1.2 (ref 0.8–1.2)
Prothrombin Time: 15.4 seconds — ABNORMAL HIGH (ref 11.4–15.2)

## 2020-12-22 LAB — GLUCOSE, CAPILLARY: Glucose-Capillary: 127 mg/dL — ABNORMAL HIGH (ref 70–99)

## 2020-12-22 LAB — POCT INR: INR: 1.2 — AB (ref 2.0–3.0)

## 2020-12-22 LAB — APTT: aPTT: 68 seconds — ABNORMAL HIGH (ref 24–36)

## 2020-12-22 MED ORDER — SODIUM CHLORIDE 0.9 % IV SOLN: INTRAVENOUS | Status: DC

## 2020-12-22 MED ORDER — FENTANYL CITRATE (PF) 100 MCG/2ML IJ SOLN
INTRAMUSCULAR | Status: DC | PRN
  Administered 2020-12-22: 50 ug via INTRAVENOUS

## 2020-12-22 MED ORDER — LIDOCAINE HCL 1 % IJ SOLN
INTRAMUSCULAR | Status: AC
  Filled 2020-12-22: qty 20

## 2020-12-22 MED ORDER — HEPARIN SOD (PORK) LOCK FLUSH 100 UNIT/ML IV SOLN
500.0000 [IU] | INTRAVENOUS | Status: AC | PRN
  Administered 2020-12-22: 500 [IU]
  Filled 2020-12-22: qty 5

## 2020-12-22 MED ORDER — FENTANYL CITRATE (PF) 100 MCG/2ML IJ SOLN
INTRAMUSCULAR | Status: AC
  Filled 2020-12-22: qty 2

## 2020-12-22 MED ORDER — MIDAZOLAM HCL 2 MG/2ML IJ SOLN
INTRAMUSCULAR | Status: DC | PRN
  Administered 2020-12-22: 1 mg via INTRAVENOUS

## 2020-12-22 MED ORDER — MIDAZOLAM HCL 2 MG/2ML IJ SOLN
INTRAMUSCULAR | Status: AC
  Filled 2020-12-22: qty 2

## 2020-12-22 MED ORDER — GELATIN ABSORBABLE 12-7 MM EX MISC
CUTANEOUS | Status: AC
  Filled 2020-12-22: qty 1

## 2020-12-22 MED ORDER — HYDROCODONE-ACETAMINOPHEN 5-325 MG PO TABS: 1.0000 | ORAL_TABLET | ORAL | Status: DC | PRN

## 2020-12-22 NOTE — H&P (Signed)
Chief Complaint: Patient was seen in consultation today for liver lesion biopsy.  Referring Physician(s): [id number],FUXNA  Supervising Physician: Markus Daft  Patient Status: [facility] - Out-pt  History of Present Illness: Sabrina [name] is a 70 y.o. female with a past medical history significant for anemia, a.fib (on Warfarin), DM, HLD, HTN, breast cancer and colon cancer who presents today for a liver lesion biospy with moderate sedation. Sabrina [name] is followed by Dr. Lindi Adie for both recent breast cancer and colon cancer diagnoses - Sabrina [name] underwent CT abdomen at [facility] on 12/03/20 which noted a 3.7 cm liver lesion suspicious for metastatic disease. Sabrina [name] has been referred to IR for an image guided biopsy of this area to further direct care.  Sabrina [name] denies any complaints today, Sabrina [name] is feeling well overall. Sabrina [name] had some grits at 6 am and some black coffee at 7 am. Sabrina [name] understands the procedure today and is agreeable to proceed as planned.  Past Medical History:  Diagnosis Date   Atrial fibrillation ([place]) [I48.91] 11/08/2016   Benign essential hypertension 08/25/2015   Colon cancer ([place]) 03/18/2020   Diabetes ([place])    Diabetes mellitus ([place]) 06/12/2018   Dyslipidemia 02/13/2015   Dysrhythmia    AFIB   Encounter for therapeutic drug monitoring 11/08/2016   Family history of breast cancer    Family history of leukemia    Family history of prostate cancer    Genetic testing 07/05/2018   Negative genetic testing on the common hereditary cancer panel.  The Hereditary Gene Panel offered by [facility] includes sequencing and/or deletion duplication testing of the following 48 genes: APC, ATM, AXIN2, BARD1, BMPR1A, BRCA1, BRCA2, BRIP1, CDH1, CDK4, CDKN2A (p14ARF), CDKN2A (p16INK4a), CHEK2, CTNNA1, DICER1, EPCAM (Deletion/duplication testing only), GREM1 (promoter region deletion/duplicat   Hyperlipidemia    Hypothyroidism due to Hashimoto's thyroiditis 05/23/2015   Iron deficiency anemia due to  chronic blood loss 12/26/2019   Long term (current) use of anticoagulants [Z79.01] 11/08/2016   Malignant neoplasm of upper-outer quadrant of right breast in female, estrogen receptor positive ([place]) 06/26/2018   Moderate episode of recurrent major depressive disorder ([place]) 12/01/2015   Paroxysmal atrial fibrillation ([place]) 02/13/2015   Formatting of this note might be different from the original. Chads score 1, chads vas 2, anticoagulated with warfarin   Personal history of chemotherapy    Personal history of radiation therapy    Port-A-Cath in place 08/24/2018   Preop cardiovascular exam 07/05/2018   Negative genetic testing on the common hereditary cancer panel.  The Hereditary Gene Panel offered by [facility] includes sequencing and/or deletion duplication testing of the following 48 genes: APC, ATM, AXIN2, BARD1, BMPR1A, BRCA1, BRCA2, BRIP1, CDH1, CDK4, CDKN2A (p14ARF), CDKN2A (p16INK4a), CHEK2, CTNNA1, DICER1, EPCAM (Deletion/duplication testing only), GREM1 (promoter region deletion/duplicat   Smoking 06/15/5730   Statin intolerance 12/08/2016   Type 2 diabetes mellitus without complication, without long-term current use of insulin ([place]) 05/23/2015    Past Surgical History:  Procedure Laterality Date   ABDOMINAL HYSTERECTOMY     APPENDECTOMY     BREAST LUMPECTOMY Right 07/13/2018   BREAST LUMPECTOMY WITH RADIOACTIVE SEED AND SENTINEL LYMPH NODE BIOPSY Right 07/13/2018   Procedure: RIGHT BREAST LUMPECTOMY WITH RADIOACTIVE SEED AND RIGHT SENTINEL LYMPH NODE MAPPING;  Surgeon: Erroll Luna, MD;  Location: [place];  Service: General;  Laterality: Right;   BREAST SURGERY     CHOLECYSTECTOMY     KNEE SURGERY     PORT-A-CATH REMOVAL N/A 08/21/2019   Procedure: REMOVAL PORT-A-CATH;  Surgeon: Erroll Luna, MD;  Location: [place];  Service: General;  Laterality: N/A;   PORTACATH PLACEMENT Right 08/02/2018   Procedure: INSERTION PORT-A-CATH WITH ULTRASOUND;  Surgeon:  Erroll Luna, MD;  Location: [place];  Service: General;  Laterality: Right;    Allergies: Atorvastatin and Meperidine  Medications: Prior to Admission medications   Medication Sig Start Date End Date Taking? Authorizing Provider  anastrozole (ARIMIDEX) 1 MG tablet TAKE 1 TABLET DAILY Patient taking differently: Take 1 mg by mouth daily. 03/31/20   Nicholas Lose, MD  calcium carbonate (OSCAL) 1500 (600 Ca) MG TABS tablet Take 600 mg of elemental calcium by mouth 2 (two) times daily with a meal.    [provider]  digoxin (LANOXIN) 0.125 MG tablet Take 1 tablet (0.125 mg total) by mouth daily. 05/29/20   Park Liter, MD  escitalopram (LEXAPRO) 10 MG tablet Take 10 mg by mouth daily. Patient not taking: Reported on 11/26/2020 12/04/19   [provider]  flecainide (TAMBOCOR) 100 MG tablet TAKE 1 TABLET TWICE A DAY Patient not taking: Reported on 11/26/2020 03/03/20   Park Liter, MD  gabapentin (NEURONTIN) 300 MG capsule Take 1 capsule (300 mg total) by mouth 2 (two) times daily. 12/16/20   Nicholas Lose, MD  ibuprofen (ADVIL) 800 MG tablet Take 1 tablet (800 mg total) by mouth every 8 (eight) hours as needed. Patient not taking: Reported on 11/26/2020 08/21/19   Erroll Luna, MD  levothyroxine (SYNTHROID, LEVOTHROID) 100 MCG tablet Take 1 tablet by mouth daily.    [provider]  lidocaine-prilocaine (EMLA) cream Apply 1 application topically as needed. Apply to [facility] ~ 1 hour prior to access Patient not taking: Reported on 11/26/2020 05/05/20   Ladell Pier, MD  lisinopril (PRINIVIL,ZESTRIL) 10 MG tablet Take 1 tablet (10 mg total) by mouth daily. 08/26/17   Park Liter, MD  metFORMIN (GLUCOPHAGE) 500 MG tablet Take 1 tablet by mouth 2 (two) times daily with a meal.  11/29/16   [provider]  metoprolol succinate (TOPROL-XL) 50 MG 24 hr tablet TAKE 1 TABLET DAILY Patient taking differently: Take 50 mg by mouth  daily. 04/16/19   Park Liter, MD  prochlorperazine (COMPAZINE) 10 MG tablet Take 1 tablet (10 mg total) by mouth every 6 (six) hours as needed for nausea or vomiting. Patient not taking: Reported on 11/26/2020 05/05/20   Ladell Pier, MD  warfarin (COUMADIN) 10 MG tablet TAKE ONE-HALF (1/2) TO ONE TABLET AS DIRECTED BY COUMADIN CLINIC Patient taking differently: Take 5 mg by mouth as directed. TAKE ONE-HALF (1/2) TO ONE TABLET AS DIRECTED BY COUMADIN CLINIC 06/04/20   Park Liter, MD     Family History  Problem Relation Age of Onset   Prostate cancer Father    Cancer Brother    Acute myelogenous leukemia Brother    Alcohol abuse Maternal Aunt    Hypertension Maternal Aunt    Breast cancer Maternal Aunt        dx over 10   Alcohol abuse Paternal Uncle    Diabetes Paternal Uncle    Hypertension Maternal Grandmother    Breast cancer Mother 11       Deceased at 88   Cancer Maternal Uncle    Diabetes Maternal Uncle    Alcohol abuse Maternal Uncle    Diabetes Son    Breast cancer Cousin        dx over 39   Breast cancer  Cousin 31    Social History   Socioeconomic History   Marital status: Widowed    Spouse name: Not on file   Number of children: Not on file   Years of education: Not on file   Highest education level: Not on file  Occupational History   Not on file  Tobacco Use   Smoking status: Every Day    Packs/day: 2.00    Years: 40.00    Pack years: 80.00    Types: Cigarettes   Smokeless tobacco: Never  Vaping Use   Vaping Use: Never used  Substance and Sexual Activity   Alcohol use: Never   Drug use: Never   Sexual activity: Not on file  Other Topics Concern   Not on file  Social History Narrative   Not on file   Social Determinants of Health   Financial Resource Strain: Not on file  Food Insecurity: Not on file  Transportation Needs: Not on file  Physical Activity: Not on file  Stress: Not on file  Social Connections: Not on file      Review of Systems: A 12 point ROS discussed and pertinent positives are indicated in the HPI above.  All other systems are negative.  Review of Systems  Constitutional:  Negative for chills and fever.  Respiratory:  Negative for cough and shortness of breath.   Cardiovascular:  Negative for chest pain.  Gastrointestinal:  Negative for abdominal pain, nausea and vomiting.  Musculoskeletal:  Negative for back pain.  Neurological:  Negative for headaches.   Vital Signs: BP 136/84   Pulse 77   Temp 98.3 F (36.8 C) (Oral)   Resp 16   SpO2 96%   Physical Exam Vitals reviewed.  Constitutional:      General: Sabrina [name] is not in acute distress. HENT:     Head: Normocephalic.     Mouth/Throat:     Mouth: Mucous membranes are moist.     Pharynx: Oropharynx is clear. No oropharyngeal exudate or posterior oropharyngeal erythema.  Cardiovascular:     Rate and Rhythm: Normal rate and regular rhythm.  Pulmonary:     Effort: Pulmonary effort is normal.     Breath sounds: Normal breath sounds.  Abdominal:     General: There is no distension.     Palpations: Abdomen is soft.     Tenderness: There is no abdominal tenderness.  Skin:    General: Skin is warm and dry.     Coloration: Skin is not jaundiced.  Neurological:     Mental Status: Sabrina [name] is alert and oriented to person, place, and time.  Psychiatric:        Mood and Affect: Mood normal.        Behavior: Behavior normal.        Thought Content: Thought content normal.        Judgment: Judgment normal.         Imaging: No results found.  Labs:  CBC: Recent Labs    09/15/20 1111 09/29/20 1038 10/14/20 1102 12/16/20 1416  WBC 4.8 5.0 3.6* 6.2  HGB 11.8* 11.3* 11.3* 13.7  HCT 38.7 36.5 35.6* 42.3  PLT 89* 75* 73* 106*    COAGS: Recent Labs    09/29/20 1037 09/29/20 1126 10/14/20 1102 11/25/20 1602  INR 4.9* 4.9* 2.4* 1.8*    BMP: Recent Labs    09/15/20 1111 09/29/20 1037 10/14/20 1102 12/16/20 1416   NA 140 139 142 140  K 4.1 3.9 3.9 4.0  CL 104 104 105 102  CO2 27 29 29 28   GLUCOSE 112* 130* 108* 117*  BUN 12 10 10 8   CALCIUM 9.8 9.4 9.7 9.9  CREATININE 0.62 0.43* 0.61 0.66  GFRNONAA >60 >60 >60 >60    LIVER FUNCTION TESTS: Recent Labs    09/15/20 1111 09/29/20 1037 10/14/20 1102 12/16/20 1416  BILITOT 0.4 0.6 0.3 0.3  AST 25 26 26 28   ALT 14 18 14 16   ALKPHOS 125 95 118 130*  PROT 7.3 7.2 7.0 7.7  ALBUMIN 3.7 3.9 3.5 4.0    TUMOR MARKERS: No results for input(s): AFPTM, CEA, CA199, CHROMGRNA in the last 8760 hours.  Assessment and Plan:  70 y/o F with history of breast cancer and colon cancer with new 3.7 cm live lesion noted on recent imaging concerning for metastatic disease who presents today for a liver lesion biopsy with moderate sedation.  Patient has been NPO since midnight, last dose of warfarin 9/7. Afebrile, WBC 5.5, hgb 13.8, plt 117, INR 1.2.  Risks and benefits of liver lesion biopsy was discussed with the patient and/or patient's family including, but not limited to bleeding, infection, damage to adjacent structures or low yield requiring additional tests.  All of the questions were answered and there is agreement to proceed.  Consent signed and in chart.   Thank you for this interesting consult.  I greatly enjoyed meeting Sabrina [name] and look forward to participating in their care.  A copy of this report was sent to the requesting provider on this date.  Electronically Signed: Joaquim Nam, PA-C 12/22/2020, 11:31 AM   I spent a total of 30 Minutes  in face to face in clinical consultation, greater than 50% of which was counseling/coordinating care for liver lesion biopsy.

## 2020-12-22 NOTE — Procedures (Signed)
Interventional Radiology Procedure:   Indications: New liver lesion    Procedure: US guided core biopsy 2  Findings: 3 core biopsies obtained from a deep right hepatic lesion.   Complications: No immediate complications noted.     EBL: Minimal  Plan: Bedrest 3 hours   Rahn Lacuesta R. Anselm Pancoast, MD  Pager: [phone]

## 2020-12-23 LAB — SURGICAL PATHOLOGY

## 2020-12-25 ENCOUNTER — Ambulatory Visit (INDEPENDENT_AMBULATORY_CARE_PROVIDER_SITE_OTHER): Payer: Medicare Other | Admitting: Cardiology

## 2020-12-25 DIAGNOSIS — I480 Paroxysmal atrial fibrillation: Secondary | ICD-10-CM

## 2020-12-25 DIAGNOSIS — Z7901 Long term (current) use of anticoagulants: Secondary | ICD-10-CM | POA: Diagnosis not present

## 2020-12-25 DIAGNOSIS — Z5181 Encounter for therapeutic drug level monitoring: Secondary | ICD-10-CM

## 2020-12-25 NOTE — Progress Notes (Signed)
LMOM

## 2020-12-30 ENCOUNTER — Ambulatory Visit (INDEPENDENT_AMBULATORY_CARE_PROVIDER_SITE_OTHER): Payer: Medicare Other

## 2020-12-30 ENCOUNTER — Encounter (HOSPITAL_COMMUNITY)
Admission: RE | Admit: 2020-12-30 | Discharge: 2020-12-30 | Disposition: A | Discharge status: Home / Self Care | Payer: Medicare Other | Source: Ambulatory Visit | Attending: Hematology and Oncology | Admitting: Hematology and Oncology

## 2020-12-30 ENCOUNTER — Other Ambulatory Visit: Payer: Self-pay

## 2020-12-30 DIAGNOSIS — C182 Malignant neoplasm of ascending colon: Secondary | ICD-10-CM | POA: Diagnosis not present

## 2020-12-30 DIAGNOSIS — R911 Solitary pulmonary nodule: Secondary | ICD-10-CM | POA: Diagnosis not present

## 2020-12-30 DIAGNOSIS — I480 Paroxysmal atrial fibrillation: Secondary | ICD-10-CM

## 2020-12-30 DIAGNOSIS — Z923 Personal history of irradiation: Secondary | ICD-10-CM | POA: Diagnosis not present

## 2020-12-30 DIAGNOSIS — Z79899 Other long term (current) drug therapy: Secondary | ICD-10-CM | POA: Insufficient documentation

## 2020-12-30 DIAGNOSIS — Z170 Estrogen receptor positive status [ER+]: Secondary | ICD-10-CM | POA: Insufficient documentation

## 2020-12-30 DIAGNOSIS — Z5181 Encounter for therapeutic drug level monitoring: Secondary | ICD-10-CM | POA: Diagnosis not present

## 2020-12-30 DIAGNOSIS — C50411 Malignant neoplasm of upper-outer quadrant of right female breast: Secondary | ICD-10-CM | POA: Insufficient documentation

## 2020-12-30 DIAGNOSIS — Z7901 Long term (current) use of anticoagulants: Secondary | ICD-10-CM | POA: Diagnosis not present

## 2020-12-30 DIAGNOSIS — I700 Atherosclerosis of aorta: Secondary | ICD-10-CM | POA: Insufficient documentation

## 2020-12-30 LAB — POCT INR: INR: 2.4 (ref 2.0–3.0)

## 2020-12-30 LAB — GLUCOSE, CAPILLARY: Glucose-Capillary: 114 mg/dL — ABNORMAL HIGH (ref 70–99)

## 2020-12-30 MED ORDER — FLUDEOXYGLUCOSE F - 18 (FDG) INJECTION
7.4000 | Freq: Once | INTRAVENOUS | Status: AC
  Administered 2020-12-30: 7.86 via INTRAVENOUS

## 2020-12-30 NOTE — Patient Instructions (Signed)
continue 10mg  daily except 5mg  on Mondays, Wednesdays and Saturdays.  Recheck INR 4 wks. [phone].

## 2020-12-31 NOTE — Progress Notes (Signed)
Patient Care Team: [name], NP as PCP - General (Family Medicine) Park Liter, MD as PCP - Cardiology (Cardiology) Erroll Luna, MD as Consulting Physician (General Surgery) Nicholas Lose, MD as Consulting Physician (Hematology and Oncology) Eppie Gibson, MD as Attending Physician (Radiation Oncology) Park Liter, MD as Consulting Physician (Cardiology) Jonnie Finner, RN (Inactive) as Oncology Nurse Navigator Ladell Pier, MD as Consulting Physician (Oncology)  DIAGNOSIS:    ICD-10-CM   1. Metastatic colon cancer to liver ([place])  C18.9    C78.7       SUMMARY OF ONCOLOGIC HISTORY: Oncology History  Malignant neoplasm of upper-outer quadrant of right breast in female, estrogen receptor positive ([place])  06/19/2018 Initial Diagnosis   Screening mammogram detected right breast mass at 9 o'clock position 3 cm from the nipple, ultrasound revealed 7 x 5 x 5 mm mass, axillary ultrasound normal-appearing lymph nodes, ultrasound biopsy: Grade 1 IDC ER 100%, PR 10%, Ki-67 10%, HER-2 3+ positive, T1b N0 stage Ia   06/28/2018 Cancer Staging   Staging form: Breast, AJCC 8th Edition - Clinical stage from 06/28/2018: Stage IA (cT1b, cN0, cM0, G1, ER+, PR+, HER2+) - Signed by Nicholas Lose, MD on 06/28/2018   07/05/2018 Genetic Testing   Negative genetic testing on the common hereditary cancer panel.  The Hereditary Gene Panel offered by [facility] includes sequencing and/or deletion duplication testing of the following 48 genes: APC, ATM, AXIN2, BARD1, BMPR1A, BRCA1, BRCA2, BRIP1, CDH1, CDK4, CDKN2A (p14ARF), CDKN2A (p16INK4a), CHEK2, CTNNA1, DICER1, EPCAM (Deletion/duplication testing only), GREM1 (promoter region deletion/duplication testing only), KIT, MEN1, MLH1, MSH2, MSH3, MSH6, MUTYH, NBN, NF1, NHTL1, PALB2, PDGFRA, PMS2, POLD1, POLE, PTEN, RAD50, RAD51C, RAD51D, RNF43, SDHB, SDHC, SDHD, SMAD4, SMARCA4. STK11, TP53, TSC1, TSC2, and VHL.  The following genes were  evaluated for sequence changes only: SDHA and HOXB13 c.251G>A variant only. The report date is July 05, 2018.   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    08/17/2018 - 07/11/2019 Chemotherapy   Taxol Herceptin followed by Herceptin maintenance   12/05/2018 - 01/02/2019 Radiation Therapy   Adjuvant XRT   01/25/2019 -  Anti-estrogen oral therapy   Anastrozole 24m daily, plan 5-7 years   07/13/2019 Cancer Staging   Staging form: Breast, AJCC 8th Edition - Pathologic stage from 07/13/2019: Stage IA (pT1b, pN0, cM0, G2, ER+, PR+, HER2+) - Signed by CGardenia Phlegm NP on 08/01/2019   05/12/2020 -  Chemotherapy    Patient is on Treatment Plan: COLORECTAL FOLFOX Q14D X 6 MONTHS       [place]  03/13/2020 Initial Diagnosis   CT Colonoscopy: Large mass centered about the medial wall cecum and terminal ileum, favoring colonic versus less 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 Adjacent ileocolic mesenteric adenopathy, consistent with nodal metastasis. 3 mm right lower lobe pulmonary nodule   04/29/2020 Cancer Staging   Staging form: Colon and Rectum, AJCC 8th Edition - Clinical: Stage IIIC (cT3, cN2b, cM0) - Signed by SLadell Pier MD on 04/29/2020   05/12/2020 -  Chemotherapy    Patient is on Treatment Plan: COLORECTAL FOLFOX Q14D X 6 MONTHS       12/22/2020 Relapse/Recurrence   Liver biopsy: Adenocarcinoma, consistent with metastatic colon cancer   12/30/2020 PET scan   Right hepatic lobe lesion 4.4 cm (previously was 3.3 cm), periportal adenopathy is more bulky 2.9 cm (was 1.9  cm), small lymph node 7 mm along the left periaortic chain     CHIEF COMPLIANT: Follow-up of right breast cancer  INTERVAL HISTORY: Sabrina [name] is a 70 y.o. with above-mentioned history of right breast cancer who underwent a lumpectomy, adjuvant chemotherapy, radiation, Herceptin  maintenance, and is currently on anti-estrogen therapy with anastrozole. She also has a history of severe iron deficiency anemia, and colon cancer for which she underwent surgery and chemotherapy with FOLFOX. She presents to the clinic today for follow-up.   ALLERGIES:  is allergic to atorvastatin and meperidine.  MEDICATIONS:  Current Outpatient Medications  Medication Sig Dispense Refill   anastrozole (ARIMIDEX) 1 MG tablet TAKE 1 TABLET DAILY (Patient taking differently: Take 1 mg by mouth daily.) 90 tablet 3   calcium carbonate (OSCAL) 1500 (600 Ca) MG TABS tablet Take 600 mg of elemental calcium by mouth 2 (two) times daily with a meal.     digoxin (LANOXIN) 0.125 MG tablet Take 1 tablet (0.125 mg total) by mouth daily. 90 tablet 1   escitalopram (LEXAPRO) 10 MG tablet Take 10 mg by mouth daily. (Patient not taking: Reported on 11/26/2020)     flecainide (TAMBOCOR) 100 MG tablet TAKE 1 TABLET TWICE A DAY (Patient not taking: Reported on 11/26/2020) 60 tablet 0   gabapentin (NEURONTIN) 300 MG capsule Take 1 capsule (300 mg total) by mouth 2 (two) times daily. 60 capsule 3   ibuprofen (ADVIL) 800 MG tablet Take 1 tablet (800 mg total) by mouth every 8 (eight) hours as needed. (Patient not taking: Reported on 11/26/2020) 30 tablet 0   levothyroxine (SYNTHROID, LEVOTHROID) 100 MCG tablet Take 1 tablet by mouth daily.     lidocaine-prilocaine (EMLA) cream Apply 1 application topically as needed. Apply to [facility] ~ 1 hour prior to access (Patient not taking: Reported on 11/26/2020) 30 g 0   lisinopril (PRINIVIL,ZESTRIL) 10 MG tablet Take 1 tablet (10 mg total) by mouth daily. 90 tablet 3   metFORMIN (GLUCOPHAGE) 500 MG tablet Take 1 tablet by mouth 2 (two) times daily with a meal.      metoprolol succinate (TOPROL-XL) 50 MG 24 hr tablet TAKE 1 TABLET DAILY (Patient taking differently: Take 50 mg by mouth daily.) 90 tablet 3   prochlorperazine (COMPAZINE) 10 MG tablet Take 1 tablet (10 mg total) by mouth  every 6 (six) hours as needed for nausea or vomiting. (Patient not taking: Reported on 11/26/2020) 30 tablet 1   warfarin (COUMADIN) 10 MG tablet TAKE ONE-HALF (1/2) TO ONE TABLET AS DIRECTED BY COUMADIN CLINIC (Patient taking differently: Take 5 mg by mouth as directed. TAKE ONE-HALF (1/2) TO ONE TABLET AS DIRECTED BY COUMADIN CLINIC) 90 tablet 3   No current facility-administered medications for this visit.    PHYSICAL EXAMINATION: ECOG PERFORMANCE STATUS: 1 - Symptomatic but completely ambulatory  Vitals:   01/01/21 1447  BP: 139/72  Pulse: 84  Resp: 18  Temp: 97.8 F (36.6 C)  SpO2: 100%   Filed Weights   01/01/21 1447  Weight: 143 lb 14.4 oz (65.3 kg)     LABORATORY DATA:  I have reviewed the data as listed CMP Latest Ref Rng & Units 12/16/2020 10/14/2020 09/29/2020  Glucose 70 - 99 mg/dL 117(H) 108(H) 130(H)  BUN 8 - 23 mg/dL 8 10 10   Creatinine 0.44 - 1.00 mg/dL 0.66 0.61 0.43(L)  Sodium 135 - 145 mmol/L 140 142 139  Potassium 3.5 - 5.1 mmol/L 4.0 3.9 3.9  Chloride 98 - 111 mmol/L 102 105  104  CO2 22 - 32 mmol/L 28 29 29   Calcium 8.9 - 10.3 mg/dL 9.9 9.7 9.4  Total Protein 6.5 - 8.1 g/dL 7.7 7.0 7.2  Total Bilirubin 0.3 - 1.2 mg/dL 0.3 0.3 0.6  Alkaline Phos 38 - 126 U/L 130(H) 118 95  AST 15 - 41 U/L 28 26 26   ALT 0 - 44 U/L 16 14 18     Lab Results  Component Value Date   WBC 5.5 12/22/2020   HGB 13.8 12/22/2020   HCT 44.1 12/22/2020   MCV 92.6 12/22/2020   PLT 117 (L) 12/22/2020   NEUTROABS 4.3 12/16/2020    ASSESSMENT & PLAN:  Metastatic colon cancer to liver ([place]) PET/CT 01/01/2021 right hepatic lobe lesion 4.4 cm (previously was 3.3 cm), periportal adenopathy is more bulky 2.9 cm (was 1.9 cm), small lymph node 7 mm along the left periaortic chain  Liver biopsy 12/22/2020: Metastatic colon cancer (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, stage IIIc pT3pN2b, 12 cycles of FOLFOX 05/12/2020-10/14/2020)  Recommendation: I would like to  transfer the patient to Dr. Benay Spice for further systemic treatment options. She will need additional systemic chemotherapy based on molecular testing.  Prognosis: I briefly discussed with the patient that the prognosis for metastatic colon cancer depends on response to treatment as well as molecular studies.  I informed her that these are not curable and the goal would be to prolong her life as well as to palliate her symptoms.  Chemo induced peripheral neuropathy: After she finished all 12 cycles of chemo she started developing neuropathy symptoms in her hands.    No orders of the defined types were placed in this encounter.  The patient has a good understanding of the overall plan. she agrees with it. she will call with any problems that may develop before the next visit here.  Total time spent: 30 mins including face to face time and time spent for planning, charting and coordination of care  Rulon Eisenmenger, MD, MPH 01/01/2021  I, Thana Ates, am acting as scribe for Dr. Nicholas Lose.  I have reviewed the above documentation for accuracy and completeness, and I agree with the above.

## 2021-01-01 ENCOUNTER — Inpatient Hospital Stay (HOSPITAL_BASED_OUTPATIENT_CLINIC_OR_DEPARTMENT_OTHER): Payer: Medicare Other | Admitting: Hematology and Oncology

## 2021-01-01 ENCOUNTER — Other Ambulatory Visit: Payer: Self-pay

## 2021-01-01 DIAGNOSIS — C787 Secondary malignant neoplasm of liver and intrahepatic bile duct: Secondary | ICD-10-CM

## 2021-01-01 DIAGNOSIS — C189 Malignant neoplasm of colon, unspecified: Secondary | ICD-10-CM | POA: Diagnosis not present

## 2021-01-01 DIAGNOSIS — C180 Malignant neoplasm of cecum: Secondary | ICD-10-CM | POA: Diagnosis not present

## 2021-01-01 MED ORDER — DICYCLOMINE HCL 10 MG PO CAPS: 10.0000 mg | ORAL_CAPSULE | Freq: Three times a day (TID) | ORAL | Status: DC

## 2021-01-01 NOTE — Assessment & Plan Note (Signed)
PET/CT 01/01/2021 right hepatic lobe lesion 4.4 cm (previously was 3.3 cm), periportal adenopathy is more bulky 2.9 cm (was 1.9 cm), small lymph node 7 mm along the left periaortic chain  Liver biopsy 12/22/2020: Metastatic colon cancer (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, stage IIIc pT3pN2b, 12 cycles of FOLFOX 05/12/2020-10/14/2020)  Recommendation: I would like to transfer the patient to Dr. Benay Spice for further systemic treatment options. She will need additional systemic chemotherapy based on molecular testing.

## 2021-01-01 NOTE — Addendum Note (Signed)
Addended by: Nicholas Lose on: 01/01/2021 04:08 PM   Modules accepted: Orders

## 2021-01-02 ENCOUNTER — Telehealth: Payer: Self-pay | Admitting: *Deleted

## 2021-01-02 NOTE — Telephone Encounter (Signed)
No entry 

## 2021-01-07 ENCOUNTER — Telehealth: Payer: Self-pay | Admitting: *Deleted

## 2021-01-09 ENCOUNTER — Other Ambulatory Visit: Payer: Self-pay

## 2021-01-09 ENCOUNTER — Inpatient Hospital Stay (HOSPITAL_BASED_OUTPATIENT_CLINIC_OR_DEPARTMENT_OTHER): Payer: Medicare Other | Admitting: Oncology

## 2021-01-09 VITALS — BP 162/86 | HR 88 | Temp 98.1°F | Resp 20 | Ht 70.0 in | Wt 143.6 lb

## 2021-01-09 DIAGNOSIS — C787 Secondary malignant neoplasm of liver and intrahepatic bile duct: Secondary | ICD-10-CM | POA: Diagnosis not present

## 2021-01-09 DIAGNOSIS — R634 Abnormal weight loss: Secondary | ICD-10-CM

## 2021-01-09 DIAGNOSIS — C189 Malignant neoplasm of colon, unspecified: Secondary | ICD-10-CM | POA: Diagnosis not present

## 2021-01-09 DIAGNOSIS — C180 Malignant neoplasm of cecum: Secondary | ICD-10-CM | POA: Diagnosis not present

## 2021-01-09 NOTE — Progress Notes (Signed)
[facility] OFFICE PROGRESS NOTE   Diagnosis: Colon cancer  INTERVAL HISTORY:   Sabrina [name] has a history of stage III colon cancer.  She completed adjuvant FOLFOX chemotherapy under the direction of Dr. Lindi Adie after I relocated to the [facility].  She completed cycle 12 on 10/14/2020.  She reports developing intermittent upper abdominal discomfort.  She was referred for a CT of the abdomen pelvis on 12/04/2020.  There is interval development of a peripheral enhancing subcapsular mass in segment 8/5 with no change in a previously noted ill-defined subcapsular segment 7 lesion.  Necrotic appearing lymph nodes are noted in the porta hepatis. An ultrasound-guided biopsy of the right liver lesion on 12/22/2020 revealed adeno carcinoma.  The morphology is consistent with metastatic colon cancer.  Sabrina [name] was referred for a staging PET scan on 12/30/2020.  Increased metabolic activity is associated with the right liver lesion and portal adenopathy.  The right hepatic lesion has increased in size.  There is a small hypermetabolic left periaortic node.  Stable left lower lobe nodule with no increased metabolic activity.  Sabrina [name] is referred to consider systemic treatment options.  She has intermittent discomfort in the left upper abdomen.  She takes dicyclomine as needed.  She developed numbness and tingling in the fingers and feet following completion of oxaliplatin chemotherapy.  She takes gabapentin and has not noticed improvement.  She has difficulty holding objects.   No other complaint. Objective:  Vital signs in last 24 hours:  Blood pressure (!) 162/86, pulse 88, temperature 98.1 F (36.7 C), temperature source Oral, resp. rate 20, height _0  (1.778 m), weight 143 lb 9.6 oz (65.1 kg), SpO2 95 %.    Lymphatics: No cervical, supraclavicular, axillary, or inguinal nodes Resp: Lungs with scattered inspiratory/expiratory rhonchi and wheezes, no respiratory  distress Cardio: Regular rate and rhythm GI: No hepatosplenomegaly, no mass, nontender Vascular: No leg edema    Portacath/PICC-without erythema  Lab Results:  Lab Results  Component Value Date   WBC 5.5 12/22/2020   HGB 13.8 12/22/2020   HCT 44.1 12/22/2020   MCV 92.6 12/22/2020   PLT 117 (L) 12/22/2020   NEUTROABS 4.3 12/16/2020    CMP  Lab Results  Component Value Date   NA 140 12/16/2020   K 4.0 12/16/2020   CL 102 12/16/2020   CO2 28 12/16/2020   GLUCOSE 117 (H) 12/16/2020   BUN 8 12/16/2020   CREATININE 0.66 12/16/2020   CALCIUM 9.9 12/16/2020   PROT 7.7 12/16/2020   ALBUMIN 4.0 12/16/2020   AST 28 12/16/2020   ALT 16 12/16/2020   ALKPHOS 130 (H) 12/16/2020   BILITOT 0.3 12/16/2020   GFRNONAA >60 12/16/2020   GFRAA >60 08/17/2019    Lab Results  Component Value Date   CEA1 1.40 12/16/2020    Lab Results  Component Value Date   INR 2.4 12/30/2020   LABPROT 15.4 (H) 12/22/2020    Imaging: As per HPI, CT and PET images reviewed with Sabrina [name]  Medications: I have reviewed the patient's current medications.   Assessment/Plan:   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, lymphovascular invasion present including large vessel extramural invasion, loss of MLH1 and PMS2, MLH1 hyper methylation present Incomplete colonoscopy secondary to stricturing at the sigmoid colon Virtual colonoscopy 03/13/2020- mass centered at the medial wall of the cecum and terminal ileum, adjacent ileocolic mesenteric adenopathy, nonspecific 3 mm  right lower lobe nodule PET 94/50/3888 hypermetabolic cecal/terminal ileum mass with adjacent enlarged and hypermetabolic ileocolic lymph nodes, previously noted segment 7 liver lesion-not hypermetabolic- benign etiology favored, dominant left lower lobe pulmonary nodule not hypermetabolic, other tiny nodules not  hypermetabolic and below PET sensitivity Cycle 1 FOLFOX 05/12/2020 Cycle 2 FOLFOX 05/26/2020 Cycle 3 FOLFOX 06/09/2020 Cycle 4 FOLFOX 06/23/2020 Cycle 5 FOLFOX 07/07/2020 Cycle 12 FOLFOX 10/14/2020, oxaliplatin dose reduced beginning with cycle 9 CT 12/04/2020-development of peripheral enhancing hypoattenuating mass in segment 5/8, necrotic porta hepatis lymphadenopathy, similar appearance of previously visualized nonhypermetabolic subcapsular segment 7 lesion Ultrasound-guided biopsy right liver lesion 12/22/2020-adenocarcinoma consistent with a colon primary PET scan 12/30/2020-hypermetabolic hepatic lesion and portal adenopathy-increased in size compared to 12/04/2020 CT, small hypermetabolic left periaortic node 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   Disposition: Sabrina [name] has been diagnosed with metastatic colon cancer.  I last saw her when she was completing cycle 5 of adjuvant therapy.  She completed the full course of adjuvant FOLFOX under the direction of Dr. Lindi Adie.  I reviewed the CT and PET images with Sabrina [name].  We discussed treatment options.  The colon cancer has an MSI high phenotype.  She has a significant chance of a clinical response with a PD1 inhibitor.  I recommend treatment with pembrolizumab.  We reviewed potential toxicities associated with pembrolizumab including the chance of a rash, diarrhea, hypothyroidism, and other autoimmune toxicities.  She agrees to proceed.  The plan to begin every 3-week pembrolizumab on 01/15/2021.  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  Hopefully the symptoms will improve over the next few months.  I recommended she obtain an influenza vaccine and pneumonia vaccine.  We will submit the liver biopsy for Foundation 1 testing.  The upper abdominal discomfort is likely related to the portal adenopathy.  An immunotherapy plan was entered today.      Betsy Coder MD  01/09/2021  2:57 PM

## 2021-01-12 ENCOUNTER — Other Ambulatory Visit: Payer: Self-pay | Admitting: *Deleted

## 2021-01-12 MED ORDER — LIDOCAINE-PRILOCAINE 2.5-2.5 % EX CREA: 1.0000 "application " | TOPICAL_CREAM | CUTANEOUS | 2 refills | Status: DC | PRN

## 2021-01-15 ENCOUNTER — Inpatient Hospital Stay: Payer: Medicare Other

## 2021-01-15 ENCOUNTER — Inpatient Hospital Stay: Payer: Medicare Other | Attending: Nurse Practitioner

## 2021-01-15 ENCOUNTER — Other Ambulatory Visit: Payer: Self-pay

## 2021-01-15 VITALS — BP 153/84 | HR 72 | Temp 97.7°F | Resp 18

## 2021-01-15 DIAGNOSIS — Z923 Personal history of irradiation: Secondary | ICD-10-CM | POA: Diagnosis not present

## 2021-01-15 DIAGNOSIS — E039 Hypothyroidism, unspecified: Secondary | ICD-10-CM | POA: Insufficient documentation

## 2021-01-15 DIAGNOSIS — D509 Iron deficiency anemia, unspecified: Secondary | ICD-10-CM | POA: Insufficient documentation

## 2021-01-15 DIAGNOSIS — C50911 Malignant neoplasm of unspecified site of right female breast: Secondary | ICD-10-CM | POA: Diagnosis not present

## 2021-01-15 DIAGNOSIS — C787 Secondary malignant neoplasm of liver and intrahepatic bile duct: Secondary | ICD-10-CM | POA: Insufficient documentation

## 2021-01-15 DIAGNOSIS — R634 Abnormal weight loss: Secondary | ICD-10-CM

## 2021-01-15 DIAGNOSIS — Z170 Estrogen receptor positive status [ER+]: Secondary | ICD-10-CM | POA: Insufficient documentation

## 2021-01-15 DIAGNOSIS — E119 Type 2 diabetes mellitus without complications: Secondary | ICD-10-CM | POA: Insufficient documentation

## 2021-01-15 DIAGNOSIS — C189 Malignant neoplasm of colon, unspecified: Secondary | ICD-10-CM

## 2021-01-15 DIAGNOSIS — Z79899 Other long term (current) drug therapy: Secondary | ICD-10-CM | POA: Insufficient documentation

## 2021-01-15 DIAGNOSIS — I4891 Unspecified atrial fibrillation: Secondary | ICD-10-CM | POA: Diagnosis not present

## 2021-01-15 DIAGNOSIS — Z7901 Long term (current) use of anticoagulants: Secondary | ICD-10-CM | POA: Diagnosis not present

## 2021-01-15 DIAGNOSIS — Z79811 Long term (current) use of aromatase inhibitors: Secondary | ICD-10-CM | POA: Diagnosis not present

## 2021-01-15 DIAGNOSIS — F17210 Nicotine dependence, cigarettes, uncomplicated: Secondary | ICD-10-CM | POA: Insufficient documentation

## 2021-01-15 DIAGNOSIS — C180 Malignant neoplasm of cecum: Secondary | ICD-10-CM | POA: Insufficient documentation

## 2021-01-15 LAB — CBC WITH DIFFERENTIAL (CANCER CENTER ONLY)
Abs Immature Granulocytes: 0.02 10*3/uL (ref 0.00–0.07)
Basophils Absolute: 0 10*3/uL (ref 0.0–0.1)
Basophils Relative: 1 %
Eosinophils Absolute: 0 10*3/uL (ref 0.0–0.5)
Eosinophils Relative: 1 %
HCT: 42 % (ref 36.0–46.0)
Hemoglobin: 13.7 g/dL (ref 12.0–15.0)
Immature Granulocytes: 0 %
Lymphocytes Relative: 11 %
Lymphs Abs: 0.7 10*3/uL (ref 0.7–4.0)
MCH: 29.3 pg (ref 26.0–34.0)
MCHC: 32.6 g/dL (ref 30.0–36.0)
MCV: 89.7 fL (ref 80.0–100.0)
Monocytes Absolute: 0.6 10*3/uL (ref 0.1–1.0)
Monocytes Relative: 10 %
Neutro Abs: 4.7 10*3/uL (ref 1.7–7.7)
Neutrophils Relative %: 77 %
Platelet Count: 113 10*3/uL — ABNORMAL LOW (ref 150–400)
RBC: 4.68 MIL/uL (ref 3.87–5.11)
RDW: 14.5 % (ref 11.5–15.5)
WBC Count: 6 10*3/uL (ref 4.0–10.5)
nRBC: 0 % (ref 0.0–0.2)

## 2021-01-15 LAB — CMP (CANCER CENTER ONLY)
ALT: 9 U/L (ref 0–44)
AST: 18 U/L (ref 15–41)
Albumin: 4.3 g/dL (ref 3.5–5.0)
Alkaline Phosphatase: 114 U/L (ref 38–126)
Anion gap: 7 (ref 5–15)
BUN: 12 mg/dL (ref 8–23)
CO2: 31 mmol/L (ref 22–32)
Calcium: 9.9 mg/dL (ref 8.9–10.3)
Chloride: 100 mmol/L (ref 98–111)
Creatinine: 0.37 mg/dL — ABNORMAL LOW (ref 0.44–1.00)
GFR, Estimated: >60 mL/min (ref >60)
Glucose, Bld: 137 mg/dL — ABNORMAL HIGH (ref 70–99)
Potassium: 3.7 mmol/L (ref 3.5–5.1)
Sodium: 138 mmol/L (ref 135–145)
Total Bilirubin: 0.4 mg/dL (ref 0.3–1.2)
Total Protein: 7.3 g/dL (ref 6.5–8.1)

## 2021-01-15 LAB — TSH: TSH: 1.12 u[IU]/mL (ref 0.350–4.500)

## 2021-01-15 MED ORDER — SODIUM CHLORIDE 0.9 % IV SOLN
200.0000 mg | Freq: Once | INTRAVENOUS | Status: AC
  Administered 2021-01-15: 200 mg via INTRAVENOUS
  Filled 2021-01-15: qty 8

## 2021-01-15 MED ORDER — SODIUM CHLORIDE 0.9% FLUSH
10.0000 mL | INTRAVENOUS | Status: DC | PRN
  Administered 2021-01-15: 10 mL

## 2021-01-15 MED ORDER — SODIUM CHLORIDE 0.9 % IV SOLN: Freq: Once | INTRAVENOUS | Status: AC

## 2021-01-15 MED ORDER — HEPARIN SOD (PORK) LOCK FLUSH 100 UNIT/ML IV SOLN
500.0000 [IU] | Freq: Once | INTRAVENOUS | Status: AC | PRN
  Administered 2021-01-15: 500 [IU]

## 2021-01-15 NOTE — Progress Notes (Signed)
Patient presents for treatment. RN assessment completed along with the following:  Labs/vitals reviewed - Yes, and within treatment parameters.   Weight within 10% of previous measurement - Yes Oncology Treatment Attestation completed for current therapy- Yes, on date 01/09/21 Informed consent completed and reflects current therapy/intent - Yes, on date 01/15/21             Provider progress note reviewed - Patient not seen by provider today. Most recent note dated 01/09/21 reviewed. Treatment/Antibody/Supportive plan reviewed - Yes, and there are no adjustments needed for today's treatment. S&H and other orders reviewed - Yes, and there are no additional orders identified. Previous treatment date reviewed - Yes, and the appropriate amount of time has elapsed between treatments.  Patient to proceed with treatment.

## 2021-01-15 NOTE — Patient Instructions (Signed)
Sabrina [name]  Discharge Instructions: Thank you for choosing [facility] to provide your oncology and hematology care.   If you have a lab appointment with the [facility], please go directly to the [facility] and check in at the registration area.   Wear comfortable clothing and clothing appropriate for easy access to any Portacath or PICC line.   We strive to give you quality time with your provider. You may need to reschedule your appointment if you arrive late (15 or more minutes).  Arriving late affects you and other patients whose appointments are after yours.  Also, if you miss three or more appointments without notifying the office, you may be dismissed from the clinic at the provider's discretion.      For prescription refill requests, have your pharmacy contact our office and allow 72 hours for refills to be completed.    Today you received the following chemotherapy and/or immunotherapy agents: pembrolizumab Beryle Flock)      To help prevent nausea and vomiting after your treatment, we encourage you to take your nausea medication as directed.  BELOW ARE SYMPTOMS THAT SHOULD BE REPORTED IMMEDIATELY: *FEVER GREATER THAN 100.4 F (38 C) OR HIGHER *CHILLS OR SWEATING *NAUSEA AND VOMITING THAT IS NOT CONTROLLED WITH YOUR NAUSEA MEDICATION *UNUSUAL SHORTNESS OF BREATH *UNUSUAL BRUISING OR BLEEDING *URINARY PROBLEMS (pain or burning when urinating, or frequent urination) *BOWEL PROBLEMS (unusual diarrhea, constipation, pain near the anus) TENDERNESS IN MOUTH AND THROAT WITH OR WITHOUT PRESENCE OF ULCERS (sore throat, sores in mouth, or a toothache) UNUSUAL RASH, SWELLING OR PAIN  UNUSUAL VAGINAL DISCHARGE OR ITCHING   Items with * indicate a potential emergency and should be followed up as soon as possible or go to the Emergency Department if any problems should occur.  Please show the CHEMOTHERAPY ALERT CARD or IMMUNOTHERAPY ALERT CARD at  check-in to the Emergency Department and triage nurse.  Should you have questions after your visit or need to cancel or reschedule your appointment, please contact [facility]  Dept: [phone]  and follow the prompts.  Office hours are 8:00 a.m. to 4:30 p.m. Monday - Friday. Please note that voicemails left after 4:00 p.m. may not be returned until the following business day.  We are closed weekends and major holidays. You have access to a nurse at all times for urgent questions. Please call the main number to the clinic Dept: [phone] and follow the prompts.   For any non-urgent questions, you may also contact your provider using MyChart. We now offer e-Visits for anyone 10 and older to request care online for non-urgent symptoms. For details visit [email or web address].   Also download the MyChart app! Go to the app store, search "MyChart", open the app, select [facility], and log in with your MyChart username and password.  Due to Covid, a mask is required upon entering the hospital/clinic. If you do not have a mask, one will be given to you upon arrival. For doctor visits, patients may have 1 support person aged 105 or older with them. For treatment visits, patients cannot have anyone with them due to current Covid guidelines and our immunocompromised population.   Pembrolizumab injection What is this medication? PEMBROLIZUMAB (pem broe liz ue mab) is a monoclonal antibody. It is used to treat certain types of cancer. This medicine may be used for other purposes; ask your health care provider or pharmacist if you have questions. COMMON BRAND NAME(S): [employer]  What should I tell my care team before I take this medication? They need to know if you have any of these conditions: autoimmune diseases like Crohn's disease, ulcerative colitis, or lupus have had or planning to have an allogeneic stem cell transplant (uses someone else's stem cells) history of organ  transplant history of chest radiation nervous system problems like myasthenia gravis or Guillain-Barre syndrome an unusual or allergic reaction to pembrolizumab, other medicines, foods, dyes, or preservatives pregnant or trying to get pregnant breast-feeding How should I use this medication? This medicine is for infusion into a vein. It is given by a health care professional in a hospital or clinic setting. A special MedGuide will be given to you before each treatment. Be sure to read this information carefully each time. Talk to your pediatrician regarding the use of this medicine in children. While this drug may be prescribed for children as young as 6 months for selected conditions, precautions do apply. Overdosage: If you think you have taken too much of this medicine contact a poison control center or emergency room at once. NOTE: This medicine is only for you. Do not share this medicine with others. What if I miss a dose? It is important not to miss your dose. Call your doctor or health care professional if you are unable to keep an appointment. What may interact with this medication? Interactions have not been studied. This list may not describe all possible interactions. Give your health care provider a list of all the medicines, herbs, non-prescription drugs, or dietary supplements you use. Also tell them if you smoke, drink alcohol, or use illegal drugs. Some items may interact with your medicine. What should I watch for while using this medication? Your condition will be monitored carefully while you are receiving this medicine. You may need blood work done while you are taking this medicine. Do not become pregnant while taking this medicine or for 4 months after stopping it. Women should inform their doctor if they wish to become pregnant or think they might be pregnant. There is a potential for serious side effects to an unborn child. Talk to your health care professional or  pharmacist for more information. Do not breast-feed an infant while taking this medicine or for 4 months after the last dose. What side effects may I notice from receiving this medication? Side effects that you should report to your doctor or health care professional as soon as possible: allergic reactions like skin rash, itching or hives, swelling of the face, lips, or tongue bloody or black, tarry breathing problems changes in vision chest pain chills confusion constipation cough diarrhea dizziness or feeling faint or lightheaded fast or irregular heartbeat fever flushing joint pain low blood counts - this medicine may decrease the number of white blood cells, red blood cells and platelets. You may be at increased risk for infections and bleeding. muscle pain muscle weakness pain, tingling, numbness in the hands or feet persistent headache redness, blistering, peeling or loosening of the skin, including inside the mouth signs and symptoms of high blood sugar such as dizziness; dry mouth; dry skin; fruity breath; nausea; stomach pain; increased hunger or thirst; increased urination signs and symptoms of kidney injury like trouble passing urine or change in the amount of urine signs and symptoms of liver injury like dark urine, light-colored stools, loss of appetite, nausea, right upper belly pain, yellowing of the eyes or skin sweating swollen lymph nodes weight loss Side effects that usually do not require  medical attention (report to your doctor or health care professional if they continue or are bothersome): decreased appetite hair loss tiredness This list may not describe all possible side effects. Call your doctor for medical advice about side effects. You may report side effects to FDA at 1-800-FDA-1088. Where should I keep my medication? This drug is given in a hospital or clinic and will not be stored at home. NOTE: This sheet is a summary. It may not cover all possible  information. If you have questions about this medicine, talk to your doctor, pharmacist, or health care provider.  2022 Elsevier/Gold Standard (2019-02-28 21:44:53)

## 2021-01-15 NOTE — Patient Instructions (Signed)
Implanted Port Home Guide An implanted port is a device that is placed under the skin. It is usually placed in the chest. The device can be used to give IV medicine, to take blood, or for dialysis. You may have an implanted port if: You need IV medicine that would be irritating to the small veins in your hands or arms. You need IV medicines, such as antibiotics, for a long period of time. You need IV nutrition for a long period of time. You need dialysis. When you have a port, your health care provider can choose to use the port instead of veins in your arms for these procedures. You may have fewer limitations when using a port than you would if you used other types of long-term IVs, and you will likely be able to return to normal activities after your incision heals. An implanted port has two main parts: Reservoir. The reservoir is the part where a needle is inserted to give medicines or draw blood. The reservoir is round. After it is placed, it appears as a small, raised area under your skin. Catheter. The catheter is a thin, flexible tube that connects the reservoir to a vein. Medicine that is inserted into the reservoir goes into the catheter and then into the vein. How is my port accessed? To access your port: A numbing cream may be placed on the skin over the port site. Your health care provider will put on a mask and sterile gloves. The skin over your port will be cleaned carefully with a germ-killing soap and allowed to dry. Your health care provider will gently pinch the port and insert a needle into it. Your health care provider will check for a blood return to make sure the port is in the vein and is not clogged. If your port needs to remain accessed to get medicine continuously (constant infusion), your health care provider will place a clear bandage (dressing) over the needle site. The dressing and needle will need to be changed every week, or as told by your health care provider. What  is flushing? Flushing helps keep the port from getting clogged. Follow instructions from your health care provider about how and when to flush the port. Ports are usually flushed with saline solution or a medicine called heparin. The need for flushing will depend on how the port is used: If the port is only used from time to time to give medicines or draw blood, the port may need to be flushed: Before and after medicines have been given. Before and after blood has been drawn. As part of routine maintenance. Flushing may be recommended every 4-6 weeks. If a constant infusion is running, the port may not need to be flushed. Throw away any syringes in a disposal container that is meant for sharp items (sharps container). You can buy a sharps container from a pharmacy, or you can make one by using an empty hard plastic bottle with a cover. How long will my port stay implanted? The port can stay in for as long as your health care provider thinks it is needed. When it is time for the port to come out, a surgery will be done to remove it. The surgery will be similar to the procedure that was done to put the port in. Follow these instructions at home:  Flush your port as told by your health care provider. If you need an infusion over several days, follow instructions from your health care provider about how   to take care of your port site. Make sure you: Wash your hands with soap and water before you change your dressing. If soap and water are not available, use alcohol-based hand sanitizer. Change your dressing as told by your health care provider. Place any used dressings or infusion bags into a plastic bag. Throw that bag in the trash. Keep the dressing that covers the needle clean and dry. Do not get it wet. Do not use scissors or sharp objects near the tube. Keep the tube clamped, unless it is being used. Check your port site every day for signs of infection. Check for: Redness, swelling, or  pain. Fluid or blood. Pus or a bad smell. Protect the skin around the port site. Avoid wearing bra straps that rub or irritate the site. Protect the skin around your port from seat belts. Place a soft pad over your chest if needed. Bathe or shower as told by your health care provider. The site may get wet as long as you are not actively receiving an infusion. Return to your normal activities as told by your health care provider. Ask your health care provider what activities are safe for you. Carry a medical alert card or wear a medical alert bracelet at all times. This will let health care providers know that you have an implanted port in case of an emergency. Get help right away if: You have redness, swelling, or pain at the port site. You have fluid or blood coming from your port site. You have pus or a bad smell coming from the port site. You have a fever. Summary Implanted ports are usually placed in the chest for long-term IV access. Follow instructions from your health care provider about flushing the port and changing bandages (dressings). Take care of the area around your port by avoiding clothing that puts pressure on the area, and by watching for signs of infection. Protect the skin around your port from seat belts. Place a soft pad over your chest if needed. Get help right away if you have a fever or you have redness, swelling, pain, drainage, or a bad smell at the port site. This information is not intended to replace advice given to you by your health care provider. Make sure you discuss any questions you have with your health care provider. Document Revised: 06/18/2020 Document Reviewed: 08/13/2019 Elsevier Patient Education  2022 [employer] Inc.  

## 2021-01-16 ENCOUNTER — Encounter (HOSPITAL_COMMUNITY): Payer: Self-pay | Admitting: Hematology and Oncology

## 2021-01-19 ENCOUNTER — Other Ambulatory Visit: Payer: Self-pay

## 2021-01-19 ENCOUNTER — Telehealth: Payer: Self-pay

## 2021-01-19 ENCOUNTER — Ambulatory Visit (HOSPITAL_BASED_OUTPATIENT_CLINIC_OR_DEPARTMENT_OTHER)
Admission: RE | Admit: 2021-01-19 | Discharge: 2021-01-19 | Disposition: A | Discharge status: Home / Self Care | Payer: Medicare Other | Source: Ambulatory Visit | Attending: Nurse Practitioner | Admitting: Nurse Practitioner

## 2021-01-19 ENCOUNTER — Inpatient Hospital Stay: Payer: Medicare Other

## 2021-01-19 ENCOUNTER — Encounter: Payer: Self-pay | Admitting: Nurse Practitioner

## 2021-01-19 ENCOUNTER — Inpatient Hospital Stay (HOSPITAL_BASED_OUTPATIENT_CLINIC_OR_DEPARTMENT_OTHER): Payer: Medicare Other | Admitting: Nurse Practitioner

## 2021-01-19 VITALS — BP 140/90 | HR 87 | Temp 98.7°F | Resp 18 | Ht 70.0 in | Wt 132.8 lb

## 2021-01-19 DIAGNOSIS — C787 Secondary malignant neoplasm of liver and intrahepatic bile duct: Secondary | ICD-10-CM

## 2021-01-19 DIAGNOSIS — Z95828 Presence of other vascular implants and grafts: Secondary | ICD-10-CM

## 2021-01-19 DIAGNOSIS — C189 Malignant neoplasm of colon, unspecified: Secondary | ICD-10-CM | POA: Insufficient documentation

## 2021-01-19 MED ORDER — HEPARIN SOD (PORK) LOCK FLUSH 100 UNIT/ML IV SOLN
500.0000 [IU] | Freq: Once | INTRAVENOUS | Status: AC
  Administered 2021-01-19: 500 [IU] via INTRAVENOUS

## 2021-01-19 MED ORDER — PROCHLORPERAZINE MALEATE 10 MG PO TABS
10.0000 mg | ORAL_TABLET | Freq: Four times a day (QID) | ORAL | 0 refills | Status: AC | PRN
Start: 2021-01-19 — End: ?

## 2021-01-19 MED ORDER — TRAMADOL HCL 50 MG PO TABS: 50.0000 mg | ORAL_TABLET | Freq: Three times a day (TID) | ORAL | 0 refills | Status: DC | PRN

## 2021-01-19 MED ORDER — SODIUM CHLORIDE 0.9% FLUSH
10.0000 mL | INTRAVENOUS | Status: DC | PRN
  Administered 2021-01-19: 10 mL via INTRAVENOUS

## 2021-01-19 MED ORDER — SODIUM CHLORIDE 0.9 % IV SOLN: INTRAVENOUS | Status: DC

## 2021-01-19 NOTE — Patient Instructions (Signed)

## 2021-01-19 NOTE — Progress Notes (Signed)
[facility] OFFICE PROGRESS NOTE   Diagnosis: Colon cancer  INTERVAL HISTORY:   Sabrina [name] returns prior to scheduled follow-up for evaluation of abdominal pain.  She completed cycle 1 Pembrolizumab 01/15/2021.  No rash or diarrhea.  She has been having intermittent left-sided abdominal pain for several weeks.  The pain is "much worse", now constant though the intensity varies.  She is nauseated and having intermittent vomiting.  Stool described as "runny", maybe 1 or 2 times a day.  She is taking MiraLAX.  She states that she "cannot eat".  Looking at food makes her nauseated.  She is not sure about fever.  No shaking chills.  She is losing weight.  She is weak.  Objective:  Vital signs in last 24 hours:  Blood pressure 140/90, pulse 87, temperature 98.7 F (37.1 C), temperature source Oral, resp. rate 18, height 5' 10"  (1.778 m), weight 132 lb 12.8 oz (60.2 kg), SpO2 98 %.    HEENT: No thrush or ulcers.  Mucous membranes are moist. Resp: Distant breath sounds.  No respiratory distress. Cardio: Regular rate and rhythm. GI: Abdomen soft.  Tender at the left upper abdomen.  Bowel sounds present. Vascular: No leg edema. Skin: Decreased skin turgor. Port-A-Cath without erythema  Lab Results:  Lab Results  Component Value Date   WBC 6.0 01/15/2021   HGB 13.7 01/15/2021   HCT 42.0 01/15/2021   MCV 89.7 01/15/2021   PLT 113 (L) 01/15/2021   NEUTROABS 4.7 01/15/2021    Imaging:  No results found.  Medications: I have reviewed the patient's current medications.  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, lymphovascular invasion present including large vessel extramural invasion, loss of MLH1 and PMS2, MLH1 hyper methylation present Incomplete colonoscopy secondary to stricturing at the sigmoid colon Virtual colonoscopy 03/13/2020- mass  centered at the medial wall of the cecum and terminal ileum, adjacent ileocolic mesenteric adenopathy, nonspecific 3 mm right lower lobe nodule PET 25/18/9842 hypermetabolic cecal/terminal ileum mass with adjacent enlarged and hypermetabolic ileocolic lymph nodes, previously noted segment 7 liver lesion-not hypermetabolic- benign etiology favored, dominant left lower lobe pulmonary nodule not hypermetabolic, other tiny nodules not hypermetabolic and below PET sensitivity Cycle 1 FOLFOX 05/12/2020 Cycle 2 FOLFOX 05/26/2020 Cycle 3 FOLFOX 06/09/2020 Cycle 4 FOLFOX 06/23/2020 Cycle 5 FOLFOX 07/07/2020 Cycle 12 FOLFOX 10/14/2020, oxaliplatin dose reduced beginning with cycle 9 CT 12/04/2020-development of peripheral enhancing hypoattenuating mass in segment 5/8, necrotic porta hepatis lymphadenopathy, similar appearance of previously visualized nonhypermetabolic subcapsular segment 7 lesion Ultrasound-guided biopsy right liver lesion 12/22/2020-adenocarcinoma consistent with a colon primary PET scan 12/30/2020-hypermetabolic hepatic lesion and portal adenopathy-increased in size compared to 12/04/2020 CT, small hypermetabolic left periaortic node Cycle 1 Pembrolizumab 01/15/2021 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  Disposition: Sabrina [name] has metastatic colon cancer.  She presents today with worsening left upper abdominal pain, nausea.  Our review of the abdominal x-ray shows nothing acute.  We will follow-up on the final report.  For now she will adjust the laxative regimen.  We are  prescribing Compazine 10 mg every 6 hours as needed for nausea and tramadol 50 mg every 8  hours as needed for pain.  She will return for a follow-up appointment tomorrow.  She will proceed to the emergency department with any deterioration in her condition.  Patient seen with Dr. Benay Spice.  Ned Card ANP/GNP-BC   01/19/2021  2:56 PM This was a shared visit with Ned Card.  Sabrina [name] was interviewed and examined.  She has developed left abdominal pain over the past few weeks.  This predated pembrolizumab therapy.  A plain x-ray reveals a nonobstructive pattern.  It is possible the discomfort is related to constipation.  She sees intravenous fluids today.  She will increase the dose of MiraLAX and try Dulcolax tablets.  She will try magnesium citrate if she does not have a bowel movement by tomorrow.  I reviewed the abdominal x-ray and final report.  I was present for greater than 50% of today's visit.  I informed medical decision making.  Julieanne Manson, MD

## 2021-01-19 NOTE — Telephone Encounter (Signed)
Called patient for first time chemo follow-up.  Patient stated she tolerated treatment without any issues.  Patient scheduled for follow-up with Ned Card, NP this afternoon.

## 2021-01-20 ENCOUNTER — Inpatient Hospital Stay (HOSPITAL_BASED_OUTPATIENT_CLINIC_OR_DEPARTMENT_OTHER): Payer: Medicare Other | Admitting: Nurse Practitioner

## 2021-01-20 ENCOUNTER — Encounter: Payer: Self-pay | Admitting: Nurse Practitioner

## 2021-01-20 ENCOUNTER — Ambulatory Visit (HOSPITAL_BASED_OUTPATIENT_CLINIC_OR_DEPARTMENT_OTHER)
Admission: RE | Admit: 2021-01-20 | Discharge: 2021-01-20 | Disposition: A | Discharge status: Home / Self Care | Payer: Medicare Other | Source: Ambulatory Visit | Attending: Nurse Practitioner | Admitting: Nurse Practitioner

## 2021-01-20 ENCOUNTER — Inpatient Hospital Stay: Payer: Medicare Other

## 2021-01-20 ENCOUNTER — Other Ambulatory Visit: Payer: Self-pay | Admitting: Nurse Practitioner

## 2021-01-20 VITALS — BP 147/90 | HR 83 | Temp 98.1°F | Resp 18 | Ht 70.0 in | Wt 132.0 lb

## 2021-01-20 VITALS — BP 133/72 | HR 69 | Temp 98.2°F | Resp 18

## 2021-01-20 DIAGNOSIS — C787 Secondary malignant neoplasm of liver and intrahepatic bile duct: Secondary | ICD-10-CM

## 2021-01-20 DIAGNOSIS — C189 Malignant neoplasm of colon, unspecified: Secondary | ICD-10-CM

## 2021-01-20 DIAGNOSIS — Z95828 Presence of other vascular implants and grafts: Secondary | ICD-10-CM

## 2021-01-20 LAB — CMP (CANCER CENTER ONLY)
ALT: 10 U/L (ref 0–44)
AST: 23 U/L (ref 15–41)
Albumin: 4.3 g/dL (ref 3.5–5.0)
Alkaline Phosphatase: 107 U/L (ref 38–126)
Anion gap: 11 (ref 5–15)
BUN: 17 mg/dL (ref 8–23)
CO2: 29 mmol/L (ref 22–32)
Calcium: 9.9 mg/dL (ref 8.9–10.3)
Chloride: 99 mmol/L (ref 98–111)
Creatinine: 0.46 mg/dL (ref 0.44–1.00)
GFR, Estimated: >60 mL/min (ref >60)
Glucose, Bld: 140 mg/dL — ABNORMAL HIGH (ref 70–99)
Potassium: 3.9 mmol/L (ref 3.5–5.1)
Sodium: 139 mmol/L (ref 135–145)
Total Bilirubin: 0.5 mg/dL (ref 0.3–1.2)
Total Protein: 7.3 g/dL (ref 6.5–8.1)

## 2021-01-20 LAB — PROTIME-INR
INR: 2.2 — ABNORMAL HIGH (ref 0.8–1.2)
Prothrombin Time: 24.4 seconds — ABNORMAL HIGH (ref 11.4–15.2)

## 2021-01-20 MED ORDER — HEPARIN SOD (PORK) LOCK FLUSH 100 UNIT/ML IV SOLN
500.0000 [IU] | Freq: Once | INTRAVENOUS | Status: AC
  Administered 2021-01-20: 500 [IU] via INTRAVENOUS

## 2021-01-20 MED ORDER — MORPHINE SULFATE (PF) 2 MG/ML IV SOLN
2.0000 mg | Freq: Once | INTRAVENOUS | Status: AC
  Administered 2021-01-20: 2 mg via INTRAVENOUS
  Filled 2021-01-20: qty 1

## 2021-01-20 MED ORDER — HYDROCODONE-ACETAMINOPHEN 5-325 MG PO TABS: 1.0000 | ORAL_TABLET | Freq: Four times a day (QID) | ORAL | 0 refills | Status: DC | PRN

## 2021-01-20 MED ORDER — IOHEXOL 300 MG/ML  SOLN
80.0000 mL | Freq: Once | INTRAMUSCULAR | Status: AC | PRN
  Administered 2021-01-20: 80 mL via INTRAVENOUS

## 2021-01-20 MED ORDER — SODIUM CHLORIDE 0.9 % IV SOLN: 8.0000 mg | Freq: Once | INTRAVENOUS | Status: DC

## 2021-01-20 MED ORDER — SODIUM CHLORIDE 0.9 % IV SOLN: INTRAVENOUS | Status: AC

## 2021-01-20 MED ORDER — SODIUM CHLORIDE 0.9% FLUSH
10.0000 mL | Freq: Once | INTRAVENOUS | Status: AC
  Administered 2021-01-20: 10 mL via INTRAVENOUS

## 2021-01-20 MED ORDER — ONDANSETRON HCL 4 MG/2ML IJ SOLN
8.0000 mg | Freq: Once | INTRAMUSCULAR | Status: AC
  Administered 2021-01-20: 8 mg via INTRAVENOUS
  Filled 2021-01-20: qty 4

## 2021-01-20 NOTE — Progress Notes (Signed)
[facility] OFFICE PROGRESS NOTE   Diagnosis: Colon cancer  INTERVAL HISTORY:   Ms. Yorks returns as scheduled.  The abdominal x-ray from yesterday was negative.  She is more nauseated, not vomiting.  She took Dulcolax yesterday evening.  At 4 AM and 6:30 AM today she had "brown water" from the rectum.  Abdominal pain is unchanged.  She has taken Ultram twice with no significant improvement.  No bleeding.  No fever.  Objective:  Vital signs in last 24 hours:  Blood pressure (!) 147/90, pulse 83, temperature 98.1 F (36.7 C), resp. rate 18, height 5' 10"  (1.778 m), weight 132 lb (59.9 kg), SpO2 99 %.    HEENT: White coating over tongue.  Mouth appears dry. Resp: Lungs clear bilaterally. Cardio: Regular rate and rhythm. GI: Bowel sounds hypoactive.  Tender over the left abdomen.  No mass.  No hepatomegaly. Vascular: No leg edema. Skin: Decreased skin turgor Port-A-Cath without erythema.  Lab Results:  Lab Results  Component Value Date   WBC 6.0 01/15/2021   HGB 13.7 01/15/2021   HCT 42.0 01/15/2021   MCV 89.7 01/15/2021   PLT 113 (L) 01/15/2021   NEUTROABS 4.7 01/15/2021    Imaging:  CT Abdomen Pelvis W Contrast  Result Date: 01/20/2021 CLINICAL DATA:  Metastatic colon cancer. Assess for bowel obstruction. Patient complains of abdominal pain and nausea. EXAM: CT ABDOMEN AND PELVIS WITH CONTRAST TECHNIQUE: Multidetector CT imaging of the abdomen and pelvis was performed using the standard protocol following bolus administration of intravenous contrast. CONTRAST:  54m OMNIPAQUE IOHEXOL 300 MG/ML  SOLN COMPARISON:  CT AP 12/04/2020 and PET-CT 12/30/2020 FINDINGS: Lower chest: No acute abnormality. Hepatobiliary: Mass within the posteromedial right hepatic lobe measures 5.3 x 4.6 cm, image 18/2. This is compared with 5.0 x 4.7 cm previously. This mass abuts the right portal vein and posterior branch of the right portal vein. 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 Pancreas: Unremarkable. No pancreatic ductal dilatation or surrounding inflammatory changes. Spleen: Normal in size without focal abnormality. Adrenals/Urinary Tract: Adrenal glands are unremarkable. Kidneys are normal, without renal calculi, focal lesion, or hydronephrosis. Bladder is unremarkable. Stomach/Bowel: Stomach appears nondistended. Previous right hemicolectomy with enterocolonic anastomosis. No signs of small-bowel obstruction. Enteric contrast material is identified beyond the enterocolonic anastomosis to the level of the distal transverse colon. Gas and stool noted from the splenic flexure to the rectum. Distal colonic diverticulosis noted without signs of acute inflammation. Vascular/Lymphatic: Aortic atherosclerosis without signs of aneurysm. Upper abdominal adenopathy is identified. Large portacaval lymph node measures 4.7 x 3.7 cm, image 21/2. This is compared with 3.3 x 3.9 cm previously. Porta hepatic node is enlarged measuring 4.0 x 2.6 cm. Previously 2.8 x 2.2 cm. Left periaortic lymph node has enlarged measuring 2.3 x 1.2 cm, image 26/2. Formally this measured 1.3 x 0.8 cm. Reproductive: Status post hysterectomy. No adnexal masses. Other: No ascites or focal fluid collections. Musculoskeletal: Degenerative disc disease noted within the lumbar spine. Most advanced at L4-5. No acute or suspicious osseous findings. IMPRESSION: 1. No evidence for bowel obstruction. Enteric contrast material is identified beyond the enterocolonic anastomosis to the level of the distal transverse colon. 2. Since 12/30/2020 there has been interval increase in size of right hepatic lobe metastasis and upper abdominal adenopathy. 3. 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 4. Aortic Atherosclerosis (ICD10-I70.0). These results will be called to the ordering clinician or representative by the Radiologist Assistant, and communication documented in the PACS or [employer]. Electronically Signed   By: Kerby Moors M.D.   On: 01/20/2021 13:53   DG Abd 2 Views  Result Date: 01/19/2021 CLINICAL DATA:  Metastatic colon cancer, left-sided abdominal pain and nausea EXAM: ABDOMEN - 2 VIEW COMPARISON:  11/18/2020 FINDINGS: The bowel gas pattern is normal. There is no evidence of free air. Surgical clips in the right upper quadrant and right hemiabdomen no radio-opaque calculi or other significant radiographic abnormality is seen. IMPRESSION: Nonobstructive pattern of bowel gas.  No free air in the abdomen. Electronically Signed   By: Delanna Ahmadi M.D.   On: 01/19/2021 16:45    Medications: I have reviewed the patient's current medications.  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, lymphovascular invasion present including large vessel extramural invasion, loss of MLH1 and PMS2, MLH1 hyper methylation present Incomplete colonoscopy secondary to stricturing at the sigmoid colon Virtual colonoscopy 03/13/2020- mass centered at the medial wall of the cecum and terminal ileum, adjacent ileocolic mesenteric adenopathy, nonspecific 3 mm right lower lobe nodule PET 38/33/3832 hypermetabolic cecal/terminal ileum mass with adjacent enlarged and hypermetabolic ileocolic lymph nodes, previously noted segment 7 liver lesion-not hypermetabolic- benign etiology favored, dominant left lower lobe pulmonary nodule not hypermetabolic, other tiny nodules not hypermetabolic and below PET sensitivity Cycle 1 FOLFOX 05/12/2020 Cycle 2 FOLFOX 05/26/2020 Cycle 3 FOLFOX 06/09/2020 Cycle 4 FOLFOX 06/23/2020 Cycle 5 FOLFOX 07/07/2020 Cycle 12  FOLFOX 10/14/2020, oxaliplatin dose reduced beginning with cycle 9 CT 12/04/2020-development of peripheral enhancing hypoattenuating mass in segment 5/8, necrotic porta hepatis lymphadenopathy, similar appearance of previously visualized nonhypermetabolic subcapsular segment 7 lesion Ultrasound-guided biopsy right liver lesion 12/22/2020-adenocarcinoma consistent with a colon primary PET scan 12/30/2020-hypermetabolic hepatic lesion and portal adenopathy-increased in size compared to 12/04/2020 CT, small hypermetabolic left periaortic node Cycle 1 Pembrolizumab 01/15/2021 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  Disposition: Ms. Trias presents with worsening nausea, continued left side abdominal pain.  Plain x-ray done yesterday was unremarkable.  CT scan is negative for bowel obstruction.  Right hepatic lobe metastasis and upper abdominal adenopathy have increased.  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  We discussed symptoms may be due to a "flare" following cycle 1 Pembrolizumab versus due to worsening upper abdominal adenopathy.  Prescription sent to her pharmacy for hydrocodone.  She will continue to push fluids at home.  She will contact the office tomorrow with an update on her condition.   Patient seen with Dr. Benay Spice.  Ned Card ANP/GNP-BC   01/20/2021  4:21 PM This was a shared visit with Ned Card.  Ms. Vanmetre was interviewed and examined.  I reviewed the CT images and report.  There is no clear explanation for the increased pain.   The liver lesions and abdominal lymph nodes are slightly larger compared to the baseline CT (obtained greater than 1 month prior to initiating pembrolizumab).  It is possible her pain  is related to a "tumor flare "following treatment with pembrolizumab.  The pain could also be related to disease progression. We adjusted the pain regimen today.  She will continue a bowel regimen.  The plan is to proceed with cycle 2 pembrolizumab as scheduled.  I was present for greater than 50% of today's visit.  I performed medical decision making.  Julieanne Manson, MD

## 2021-01-20 NOTE — Patient Instructions (Addendum)
Rehydration, Adult Rehydration is the replacement of body fluids, salts, and minerals (electrolytes) that are lost during dehydration. Dehydration is when there is not enough water or other fluids in the body. This happens when you lose more fluids than you take in. Common causes of dehydration include: Not drinking enough fluids. This can occur when you are ill or doing activities that require a lot of energy, especially in hot weather. Conditions that cause loss of water or other fluids, such as diarrhea, vomiting, sweating, or urinating a lot. Other illnesses, such as fever or infection. Certain medicines, such as those that remove excess fluid from the body (diuretics). Symptoms of mild or moderate dehydration may include thirst, dry lips and mouth, and dizziness. Symptoms of severe dehydration may include increased heart rate, confusion, fainting, and not urinating. For severe dehydration, you may need to get fluids through an IV at the hospital. For mild or moderate dehydration, you can usually rehydrate at home by drinking certain fluids as told by your health care provider. What are the risks? Generally, rehydration is safe. However, taking in too much fluid (overhydration) can be a problem. This is rare. Overhydration can cause an electrolyte imbalance, kidney failure, or a decrease in salt (sodium) levels in the body. Supplies needed You will need an oral rehydration solution (ORS) if your health care provider tells you to use one. This is a drink to treat dehydration. It can be found in pharmacies and retail stores. How to rehydrate Fluids Follow instructions from your health care provider for rehydration. The kind of fluid and the amount you should drink depend on your condition. In general, you should choose drinks that you prefer. If told by your health care provider, drink an ORS. Make an ORS by following instructions on the package. Start by drinking small amounts, about  cup (120  mL) every 5-10 minutes. Slowly increase how much you drink until you have taken the amount recommended by your health care provider. Drink enough clear fluids to keep your urine pale yellow. If you were told to drink an ORS, finish it first, then start slowly drinking other clear fluids. Drink fluids such as: Water. This includes sparkling water and flavored water. Drinking only water can lead to having too little sodium in your body (hyponatremia). Follow the advice of your health care provider. Water from ice chips you suck on. Fruit juice with water you add to it (diluted). Sports drinks. Hot or cold herbal teas. Broth-based soups. Milk or milk products. Food Follow instructions from your health care provider about what to eat while you rehydrate. Your health care provider may recommend that you slowly begin eating regular foods in small amounts. Eat foods that contain a healthy balance of electrolytes, such as bananas, oranges, potatoes, tomatoes, and spinach. Avoid foods that are greasy or contain a lot of sugar. In some cases, you may get nutrition through a feeding tube that is passed through your nose and into your stomach (nasogastric tube, or NG tube). This may be done if you have uncontrolled vomiting or diarrhea. Beverages to avoid Certain beverages may make dehydration worse. While you rehydrate, avoid drinking alcohol. How to tell if you are recovering from dehydration You may be recovering from dehydration if: You are urinating more often than before you started rehydrating. Your urine is pale yellow. Your energy level improves. You vomit less frequently. You have diarrhea less frequently. Your appetite improves or returns to normal. You feel less dizzy or less light-headed. Your  skin tone and color start to look more normal. Follow these instructions at home: Take over-the-counter and prescription medicines only as told by your health care provider. Do not take sodium  tablets. Doing this can lead to having too much sodium in your body (hypernatremia). Contact a health care provider if: You continue to have symptoms of mild or moderate dehydration, such as: Thirst. Dry lips. Slightly dry mouth. Dizziness. Dark urine or less urine than normal. Muscle cramps. You continue to vomit or have diarrhea. Get help right away if you: Have symptoms of dehydration that get worse. Have a fever. Have a severe headache. Have been vomiting and the following happens: Your vomiting gets worse or does not go away. Your vomit includes blood or green matter (bile). You cannot eat or drink without vomiting. Have problems with urination or bowel movements, such as: Diarrhea that gets worse or does not go away. Blood in your stool (feces). This may cause stool to look black and tarry. Not urinating, or urinating only a small amount of very dark urine, within 6-8 hours. Have trouble breathing. Have symptoms that get worse with treatment. These symptoms may represent a serious problem that is an emergency. Do not wait to see if the symptoms will go away. Get medical help right away. Call your local emergency services (911 in the [place] Do not drive yourself to the hospital. Summary Rehydration is the replacement of body fluids and minerals (electrolytes) that are lost during dehydration. Follow instructions from your health care provider for rehydration. The kind of fluid and amount you should drink depend on your condition. Slowly increase how much you drink until you have taken the amount recommended by your health care provider. Contact your health care provider if you continue to show signs of mild or moderate dehydration. This information is not intended to replace advice given to you by your health care provider. Make sure you discuss any questions you have with your health care provider. Document Revised: 05/30/2019 Document Reviewed: 04/09/2019 Elsevier Patient  Education  2022 [place]. Nausea, Adult Nausea is feeling sick to your stomach or feeling that you are about to throw up (vomit). Feeling sick to your stomach is usually not serious, but it may be an early sign of a more serious medical problem. As you feel sicker to your stomach, you may throw up. If you throw up, or if you are not able to drink enough fluids, there is a risk that you may lose too much water in your body (get dehydrated). If you lose too much water in your body, you may: Feel tired. Feel thirsty. Have a dry mouth. Have cracked lips. Go pee (urinate) less often. Older adults and people who have other diseases or a weak body defense system (immune system) have a higher risk of losing too much water in the body. The main goals of treating this condition are: To relieve your nausea. To ensure your nausea occurs less often. To prevent throwing up and losing too much fluid. Follow these instructions at home: Watch your symptoms for any changes. Tell your doctor about them. Follow these instructions as told by your doctor. Eating and drinking   Take an ORS (oral rehydration solution). This is a drink that is sold at pharmacies and stores. Drink clear fluids in small amounts as you are able. These include: Water. Ice chips. Fruit juice that has water added (diluted fruit juice). Low-calorie sports drinks. Eat bland, easy-to-digest foods in small amounts as you  are able, such as: Bananas. Applesauce. Rice. Low-fat (lean) meats. Toast. Crackers. Avoid drinking fluids that have a lot of sugar or caffeine in them. This includes energy drinks, sports drinks, and soda. Avoid alcohol. Avoid spicy or fatty foods. General instructions Take over-the-counter and prescription medicines only as told by your doctor. Rest at home while you get better. Drink enough fluid to keep your pee (urine) pale yellow. Take slow and deep breaths when you feel sick to your stomach. Avoid  food or things that have strong smells. Wash your hands often with soap and water. If you cannot use soap and water, use hand sanitizer. Make sure that all people in your home wash their hands well and often. Keep all follow-up visits as told by your doctor. This is important. Contact a doctor if: You feel sicker to your stomach. You feel sick to your stomach for more than 2 days. You throw up. You are not able to drink fluids without throwing up. You have new symptoms. You have a fever. You have a headache. You have muscle cramps. You have a rash. You have pain while peeing. You feel light-headed or dizzy. Get help right away if: You have pain in your chest, neck, arm, or jaw. You feel very weak or you pass out (faint). You have throw up that is bright red or looks like coffee grounds. You have bloody or black poop (stools) or poop that looks like tar. You have a very bad headache, a stiff neck, or both. You have very bad pain, cramping, or bloating in your belly (abdomen). You have trouble breathing or you are breathing very quickly. Your heart is beating very quickly. Your skin feels cold and clammy. You feel confused. You have signs of losing too much water in your body, such as: Dark pee, very little pee, or no pee. Cracked lips. Dry mouth. Sunken eyes. Sleepiness. Weakness. These symptoms may be an emergency. Do not wait to see if the symptoms will go away. Get medical help right away. Call your local emergency services (911 in the [place] Do not drive yourself to the hospital. Summary Nausea is feeling sick to your stomach or feeling that you are about to throw up (vomit). If you throw up, or if you are not able to drink enough fluids, there is a risk that you may lose too much water in your body (get dehydrated). Eat and drink what your doctor tells you. Take over-the-counter and prescription medicines only as told by your doctor. Contact a doctor right away if your  symptoms get worse or you have new symptoms. Keep all follow-up visits as told by your doctor. This is important. This information is not intended to replace advice given to you by your health care provider. Make sure you discuss any questions you have with your health care provider. Document Revised: 06/18/2020 Document Reviewed: 09/06/2017 Elsevier Patient Education  [place].

## 2021-01-30 ENCOUNTER — Encounter: Payer: Self-pay | Admitting: *Deleted

## 2021-01-30 NOTE — Progress Notes (Signed)
Spoke with patient in regard to constipation, she is taking dulcolax which helps every 2 days, she is able to drink fluids with out issue. She had one episode of vomiting yesterday none since. Encouraged full liquids and soft foods intake and to keep bowels moving. Instructed her to call on Monday with any issues.

## 2021-02-01 ENCOUNTER — Other Ambulatory Visit: Payer: Self-pay | Admitting: Oncology

## 2021-02-02 ENCOUNTER — Telehealth: Payer: Self-pay | Admitting: *Deleted

## 2021-02-02 ENCOUNTER — Encounter: Payer: Self-pay | Admitting: *Deleted

## 2021-02-02 NOTE — Progress Notes (Signed)
Spoke with pt this am, tolerated soft diet over the weekend but has not had BM since last Thursday, no N/V, is taking dulcolax and senekot s but not taking Miralax daily, stated,"it is not doing anything" Describes dull aching pain in abdomen, under breast bone and over to the left. She is taking advil for pain control. She is coming to clinic on Wednesday for evaluation but instructed pt to call if she develops any N/V, worsening pain not controlled with medication.

## 2021-02-04 ENCOUNTER — Encounter: Payer: Self-pay | Admitting: Nurse Practitioner

## 2021-02-04 ENCOUNTER — Inpatient Hospital Stay: Payer: Medicare Other

## 2021-02-04 ENCOUNTER — Inpatient Hospital Stay (HOSPITAL_BASED_OUTPATIENT_CLINIC_OR_DEPARTMENT_OTHER): Payer: Medicare Other | Admitting: Nurse Practitioner

## 2021-02-04 VITALS — BP 136/89 | HR 100 | Temp 98.7°F | Resp 18 | Ht 70.0 in | Wt 131.0 lb

## 2021-02-04 DIAGNOSIS — D500 Iron deficiency anemia secondary to blood loss (chronic): Secondary | ICD-10-CM

## 2021-02-04 DIAGNOSIS — C189 Malignant neoplasm of colon, unspecified: Secondary | ICD-10-CM

## 2021-02-04 DIAGNOSIS — Z170 Estrogen receptor positive status [ER+]: Secondary | ICD-10-CM

## 2021-02-04 DIAGNOSIS — Z7189 Other specified counseling: Secondary | ICD-10-CM

## 2021-02-04 DIAGNOSIS — C787 Secondary malignant neoplasm of liver and intrahepatic bile duct: Secondary | ICD-10-CM

## 2021-02-04 DIAGNOSIS — C180 Malignant neoplasm of cecum: Secondary | ICD-10-CM | POA: Diagnosis not present

## 2021-02-04 LAB — CMP (CANCER CENTER ONLY)
ALT: 12 U/L (ref 0–44)
AST: 29 U/L (ref 15–41)
Albumin: 4 g/dL (ref 3.5–5.0)
Alkaline Phosphatase: 132 U/L — ABNORMAL HIGH (ref 38–126)
Anion gap: 7 (ref 5–15)
BUN: 18 mg/dL (ref 8–23)
CO2: 34 mmol/L — ABNORMAL HIGH (ref 22–32)
Calcium: 9.6 mg/dL (ref 8.9–10.3)
Chloride: 94 mmol/L — ABNORMAL LOW (ref 98–111)
Creatinine: 0.48 mg/dL (ref 0.44–1.00)
GFR, Estimated: >60 mL/min (ref >60)
Glucose, Bld: 138 mg/dL — ABNORMAL HIGH (ref 70–99)
Potassium: 3.4 mmol/L — ABNORMAL LOW (ref 3.5–5.1)
Sodium: 135 mmol/L (ref 135–145)
Total Bilirubin: 0.4 mg/dL (ref 0.3–1.2)
Total Protein: 6.9 g/dL (ref 6.5–8.1)

## 2021-02-04 LAB — CBC WITH DIFFERENTIAL (CANCER CENTER ONLY)
Abs Immature Granulocytes: 0.02 10*3/uL (ref 0.00–0.07)
Basophils Absolute: 0 10*3/uL (ref 0.0–0.1)
Basophils Relative: 1 %
Eosinophils Absolute: 0 10*3/uL (ref 0.0–0.5)
Eosinophils Relative: 1 %
HCT: 44.5 % (ref 36.0–46.0)
Hemoglobin: 14.6 g/dL (ref 12.0–15.0)
Immature Granulocytes: 0 %
Lymphocytes Relative: 14 %
Lymphs Abs: 0.9 10*3/uL (ref 0.7–4.0)
MCH: 29 pg (ref 26.0–34.0)
MCHC: 32.8 g/dL (ref 30.0–36.0)
MCV: 88.5 fL (ref 80.0–100.0)
Monocytes Absolute: 0.7 10*3/uL (ref 0.1–1.0)
Monocytes Relative: 11 %
Neutro Abs: 4.6 10*3/uL (ref 1.7–7.7)
Neutrophils Relative %: 73 %
Platelet Count: 125 10*3/uL — ABNORMAL LOW (ref 150–400)
RBC: 5.03 MIL/uL (ref 3.87–5.11)
RDW: 14.2 % (ref 11.5–15.5)
WBC Count: 6.2 10*3/uL (ref 4.0–10.5)
nRBC: 0 % (ref 0.0–0.2)

## 2021-02-04 MED ORDER — HEPARIN SOD (PORK) LOCK FLUSH 100 UNIT/ML IV SOLN
250.0000 [IU] | Freq: Once | INTRAVENOUS | Status: AC | PRN
  Administered 2021-02-04: 500 [IU]

## 2021-02-04 MED ORDER — OXYCODONE HCL 5 MG PO TABS: 5.0000 mg | ORAL_TABLET | ORAL | 0 refills | Status: DC | PRN

## 2021-02-04 MED ORDER — SORBITOL 70 % PO SOLN: ORAL | 0 refills | Status: DC

## 2021-02-04 MED ORDER — SODIUM CHLORIDE 0.9% FLUSH
3.0000 mL | Freq: Once | INTRAVENOUS | Status: AC | PRN
  Administered 2021-02-04: 10 mL

## 2021-02-04 NOTE — Addendum Note (Signed)
Addended by: Teodoro Spray on: 02/04/2021 03:15 PM   Modules accepted: Orders

## 2021-02-04 NOTE — Patient Instructions (Signed)
Implanted Port Home Guide An implanted port is a device that is placed under the skin. It is usually placed in the chest. The device can be used to give IV medicine, to take blood, or for dialysis. You may have an implanted port if: You need IV medicine that would be irritating to the small veins in your hands or arms. You need IV medicines, such as antibiotics, for a long period of time. You need IV nutrition for a long period of time. You need dialysis. When you have a port, your health care provider can choose to use the port instead of veins in your arms for these procedures. You may have fewer limitations when using a port than you would if you used other types of long-term IVs, and you will likely be able to return to normal activities after your incision heals. An implanted port has two main parts: Reservoir. The reservoir is the part where a needle is inserted to give medicines or draw blood. The reservoir is round. After it is placed, it appears as a small, raised area under your skin. Catheter. The catheter is a thin, flexible tube that connects the reservoir to a vein. Medicine that is inserted into the reservoir goes into the catheter and then into the vein. How is my port accessed? To access your port: A numbing cream may be placed on the skin over the port site. Your health care provider will put on a mask and sterile gloves. The skin over your port will be cleaned carefully with a germ-killing soap and allowed to dry. Your health care provider will gently pinch the port and insert a needle into it. Your health care provider will check for a blood return to make sure the port is in the vein and is not clogged. If your port needs to remain accessed to get medicine continuously (constant infusion), your health care provider will place a clear bandage (dressing) over the needle site. The dressing and needle will need to be changed every week, or as told by your health care provider. What  is flushing? Flushing helps keep the port from getting clogged. Follow instructions from your health care provider about how and when to flush the port. Ports are usually flushed with saline solution or a medicine called heparin. The need for flushing will depend on how the port is used: If the port is only used from time to time to give medicines or draw blood, the port may need to be flushed: Before and after medicines have been given. Before and after blood has been drawn. As part of routine maintenance. Flushing may be recommended every 4-6 weeks. If a constant infusion is running, the port may not need to be flushed. Throw away any syringes in a disposal container that is meant for sharp items (sharps container). You can buy a sharps container from a pharmacy, or you can make one by using an empty hard plastic bottle with a cover. How long will my port stay implanted? The port can stay in for as long as your health care provider thinks it is needed. When it is time for the port to come out, a surgery will be done to remove it. The surgery will be similar to the procedure that was done to put the port in. Follow these instructions at home:  Flush your port as told by your health care provider. If you need an infusion over several days, follow instructions from your health care provider about how   to take care of your port site. Make sure you: Wash your hands with soap and water before you change your dressing. If soap and water are not available, use alcohol-based hand sanitizer. Change your dressing as told by your health care provider. Place any used dressings or infusion bags into a plastic bag. Throw that bag in the trash. Keep the dressing that covers the needle clean and dry. Do not get it wet. Do not use scissors or sharp objects near the tube. Keep the tube clamped, unless it is being used. Check your port site every day for signs of infection. Check for: Redness, swelling, or  pain. Fluid or blood. Pus or a bad smell. Protect the skin around the port site. Avoid wearing bra straps that rub or irritate the site. Protect the skin around your port from seat belts. Place a soft pad over your chest if needed. Bathe or shower as told by your health care provider. The site may get wet as long as you are not actively receiving an infusion. Return to your normal activities as told by your health care provider. Ask your health care provider what activities are safe for you. Carry a medical alert card or wear a medical alert bracelet at all times. This will let health care providers know that you have an implanted port in case of an emergency. Get help right away if: You have redness, swelling, or pain at the port site. You have fluid or blood coming from your port site. You have pus or a bad smell coming from the port site. You have a fever. Summary Implanted ports are usually placed in the chest for long-term IV access. Follow instructions from your health care provider about flushing the port and changing bandages (dressings). Take care of the area around your port by avoiding clothing that puts pressure on the area, and by watching for signs of infection. Protect the skin around your port from seat belts. Place a soft pad over your chest if needed. Get help right away if you have a fever or you have redness, swelling, pain, drainage, or a bad smell at the port site. This information is not intended to replace advice given to you by your health care provider. Make sure you discuss any questions you have with your health care provider. Document Revised: 06/18/2020 Document Reviewed: 08/13/2019 Elsevier Patient Education  2022 [employer] Inc.  

## 2021-02-04 NOTE — Progress Notes (Signed)
[name] OFFICE PROGRESS NOTE   Diagnosis: Colon cancer  INTERVAL HISTORY:   Sabrina [name] returns as scheduled.  She continues to have mid upper abdominal pain.  She has early satiety.  She denies significant nausea.  She last vomited about 6 days ago.  She had a small bowel movement on Monday.  She continues a stool softener and laxative.  No bleeding.  No fever.  Objective:  Vital signs in last 24 hours:  Blood pressure 136/89, pulse 100, temperature 98.7 F (37.1 C), temperature source Oral, resp. rate 18, height _0  (1.778 m), weight 131 lb (59.4 kg), SpO2 96 %.    HEENT: No thrush or ulcers. Resp: Lungs clear bilaterally. Cardio: Regular rate and rhythm. GI: Tender mid upper abdomen, associated fullness. Vascular: No leg edema. Skin: No rash. Port-A-Cath without erythema.  Lab Results:  Lab Results  Component Value Date   WBC 6.2 02/04/2021   HGB 14.6 02/04/2021   HCT 44.5 02/04/2021   MCV 88.5 02/04/2021   PLT 125 (L) 02/04/2021   NEUTROABS 4.6 02/04/2021    Imaging:  No results found.  Medications: I have reviewed the patient's current medications.  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, lymphovascular invasion present including large vessel extramural invasion, loss of MLH1 and PMS2, MLH1 hyper methylation present Incomplete colonoscopy secondary to stricturing at the sigmoid colon Virtual colonoscopy 03/13/2020- mass centered at the medial wall of the cecum and terminal ileum, adjacent ileocolic mesenteric adenopathy, nonspecific 3 mm right lower lobe nodule PET 27/06/5007 hypermetabolic cecal/terminal ileum mass with adjacent enlarged and hypermetabolic ileocolic lymph nodes, previously noted segment 7 liver lesion-not hypermetabolic- benign etiology favored, dominant left lower lobe pulmonary nodule not  hypermetabolic, other tiny nodules not hypermetabolic and below PET sensitivity Cycle 1 FOLFOX 05/12/2020 Cycle 2 FOLFOX 05/26/2020 Cycle 3 FOLFOX 06/09/2020 Cycle 4 FOLFOX 06/23/2020 Cycle 5 FOLFOX 07/07/2020 Cycle 12 FOLFOX 10/14/2020, oxaliplatin dose reduced beginning with cycle 9 CT 12/04/2020-development of peripheral enhancing hypoattenuating mass in segment 5/8, necrotic porta hepatis lymphadenopathy, similar appearance of previously visualized nonhypermetabolic subcapsular segment 7 lesion Ultrasound-guided biopsy right liver lesion 12/22/2020-adenocarcinoma consistent with a colon primary PET scan 12/30/2020-hypermetabolic hepatic lesion and portal adenopathy-increased in size compared to 12/04/2020 CT, small hypermetabolic left periaortic node Foundation 1-MSI high, tumor mutation burden 31, K-ras wild-type Cycle 1 Pembrolizumab 01/15/2021 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  Disposition: [name] appears unchanged.  She has completed 1 cycle of Pembrolizumab.  She continues to have abdominal pain, constipation and early satiety.  Dr. Benay Spice reviewed the recent CTs with Ms. Doxtater and her daughter.  The upper abdominal adenopathy is the likely source of her pain.  Dr. Benay Spice discussed discontinuing Pembrolizumab and beginning FOLFIRI.  We reviewed potential toxicities associated with irinotecan including bone marrow toxicity, hair loss, nausea, diarrhea both early and late phase.  She agrees to proceed.  She will return for cycle 1 FOLFIRI early next week.  For pain  a prescription was sent to her pharmacy for oxycodone 5 mg every 4 hours as needed.  For the constipation she will try sorbitol.  Plan for cycle 1  FOLFIRI early next week.  We will see her in follow-up prior to cycle 2.  She will contact the office in the interim with any problems.  Patient seen with Dr. Benay Spice.   Ned Card ANP/GNP-BC   02/04/2021  2:34 PM  This was a shared visit with Ned Card.  Sabrina [name] was interviewed and examined.  We reviewed recent CT images with Sabrina [name] and her daughter.  She continues to have upper abdominal pain and early satiety.  I suspect her symptoms are related to persistence/progression of metastatic colon cancer.  We adjusted the narcotic pain regimen and bowel regimen today.  She has completed only 1 cycle of pembrolizumab, but her symptoms have progressed, now several weeks out from completing treatment with pembrolizumab.  Pembrolizumab will be placed on hold.  The plan is to change to FOLFIRI chemotherapy.  We reviewed potential toxicities associated with FOLFIRI.  She agrees to proceed.  I was present for greater than 50% of today's visit.  I performed medical decision making.  A chemotherapy plan was entered today.  Julieanne Manson, MD

## 2021-02-04 NOTE — Progress Notes (Signed)
DISCONTINUE OFF PATHWAY REGIMEN - Other   [id number] (Leucovorin IV D1 + Fluorouracil IV D1/CIV D1,2 + Oxaliplatin IV D1) q14 Days:   A cycle is every 14 days:     Oxaliplatin      Leucovorin      Fluorouracil      Fluorouracil   **Always confirm dose/schedule in your pharmacy ordering system**  REASON: Disease Progression PRIOR TREATMENT: [id number] (Leucovorin IV D1 + Fluorouracil IV D1/CIV D1,2 + Oxaliplatin IV D1) q14 Days TREATMENT RESPONSE: Unable to Evaluate  START OFF PATHWAY REGIMEN - Other   [id number] (Leucovorin IV D1 + Fluorouracil IV D1/CIV D1,2 + Irinotecan IV D1) q14 Days:   A cycle is every 14 days:     Irinotecan      Leucovorin      Fluorouracil      Fluorouracil   **Always confirm dose/schedule in your pharmacy ordering system**  Patient Characteristics: Intent of Therapy: Non-Curative / Palliative Intent, Discussed with Patient

## 2021-02-09 ENCOUNTER — Other Ambulatory Visit: Payer: Self-pay | Admitting: *Deleted

## 2021-02-09 DIAGNOSIS — C189 Malignant neoplasm of colon, unspecified: Secondary | ICD-10-CM

## 2021-02-12 ENCOUNTER — Other Ambulatory Visit: Payer: Medicare Other

## 2021-02-12 ENCOUNTER — Ambulatory Visit: Payer: Medicare Other

## 2021-02-12 ENCOUNTER — Inpatient Hospital Stay: Payer: Medicare Other | Admitting: Nurse Practitioner

## 2021-02-13 NOTE — Progress Notes (Signed)
Pharmacist Chemotherapy Monitoring - Initial Assessment    Anticipated start date: 02/16/21   The following has been reviewed per standard work regarding the patient's treatment regimen: The patient's diagnosis, treatment plan and drug doses, and organ/hematologic function Lab orders and baseline tests specific to treatment regimen  The treatment plan start date, drug sequencing, and pre-medications Prior authorization status  Patient's documented medication list, including drug-drug interaction screen and prescriptions for anti-emetics and supportive care specific to the treatment regimen The drug concentrations, fluid compatibility, administration routes, and timing of the medications to be used The patient's access for treatment and lifetime cumulative dose history, if applicable  The patient's medication allergies and previous infusion related reactions, if applicable   Changes made to treatment plan:  N/A  Follow up needed:  N/A   Patrica Duel, RPH, 02/13/2021  2:16 PM

## 2021-02-15 ENCOUNTER — Other Ambulatory Visit: Payer: Self-pay | Admitting: Oncology

## 2021-02-16 ENCOUNTER — Inpatient Hospital Stay: Payer: Medicare Other

## 2021-02-16 ENCOUNTER — Other Ambulatory Visit: Payer: Medicare Other

## 2021-02-16 ENCOUNTER — Encounter: Payer: Self-pay | Admitting: Hematology and Oncology

## 2021-02-16 ENCOUNTER — Ambulatory Visit: Payer: Medicare Other

## 2021-02-16 ENCOUNTER — Other Ambulatory Visit: Payer: Self-pay

## 2021-02-16 ENCOUNTER — Inpatient Hospital Stay: Payer: Medicare Other | Attending: Nurse Practitioner

## 2021-02-16 ENCOUNTER — Inpatient Hospital Stay (HOSPITAL_BASED_OUTPATIENT_CLINIC_OR_DEPARTMENT_OTHER): Payer: Medicare Other | Admitting: Nurse Practitioner

## 2021-02-16 ENCOUNTER — Other Ambulatory Visit: Payer: Self-pay | Admitting: Nurse Practitioner

## 2021-02-16 ENCOUNTER — Encounter: Payer: Self-pay | Admitting: Nurse Practitioner

## 2021-02-16 ENCOUNTER — Telehealth: Payer: Self-pay | Admitting: Nurse Practitioner

## 2021-02-16 ENCOUNTER — Encounter: Payer: Self-pay | Admitting: Oncology

## 2021-02-16 VITALS — BP 120/80 | HR 100 | Resp 18 | Ht 70.0 in | Wt 124.0 lb

## 2021-02-16 DIAGNOSIS — Z803 Family history of malignant neoplasm of breast: Secondary | ICD-10-CM | POA: Insufficient documentation

## 2021-02-16 DIAGNOSIS — Z79899 Other long term (current) drug therapy: Secondary | ICD-10-CM | POA: Insufficient documentation

## 2021-02-16 DIAGNOSIS — Z79811 Long term (current) use of aromatase inhibitors: Secondary | ICD-10-CM | POA: Diagnosis not present

## 2021-02-16 DIAGNOSIS — C189 Malignant neoplasm of colon, unspecified: Secondary | ICD-10-CM

## 2021-02-16 DIAGNOSIS — Z170 Estrogen receptor positive status [ER+]: Secondary | ICD-10-CM | POA: Diagnosis not present

## 2021-02-16 DIAGNOSIS — C50911 Malignant neoplasm of unspecified site of right female breast: Secondary | ICD-10-CM | POA: Diagnosis not present

## 2021-02-16 DIAGNOSIS — Z5111 Encounter for antineoplastic chemotherapy: Secondary | ICD-10-CM | POA: Diagnosis not present

## 2021-02-16 DIAGNOSIS — E119 Type 2 diabetes mellitus without complications: Secondary | ICD-10-CM | POA: Diagnosis not present

## 2021-02-16 DIAGNOSIS — Z5189 Encounter for other specified aftercare: Secondary | ICD-10-CM | POA: Insufficient documentation

## 2021-02-16 DIAGNOSIS — E039 Hypothyroidism, unspecified: Secondary | ICD-10-CM | POA: Diagnosis not present

## 2021-02-16 DIAGNOSIS — Z7901 Long term (current) use of anticoagulants: Secondary | ICD-10-CM | POA: Insufficient documentation

## 2021-02-16 DIAGNOSIS — C180 Malignant neoplasm of cecum: Secondary | ICD-10-CM | POA: Insufficient documentation

## 2021-02-16 DIAGNOSIS — C787 Secondary malignant neoplasm of liver and intrahepatic bile duct: Secondary | ICD-10-CM | POA: Insufficient documentation

## 2021-02-16 DIAGNOSIS — I4891 Unspecified atrial fibrillation: Secondary | ICD-10-CM | POA: Insufficient documentation

## 2021-02-16 DIAGNOSIS — F17210 Nicotine dependence, cigarettes, uncomplicated: Secondary | ICD-10-CM | POA: Insufficient documentation

## 2021-02-16 DIAGNOSIS — I480 Paroxysmal atrial fibrillation: Secondary | ICD-10-CM

## 2021-02-16 DIAGNOSIS — D509 Iron deficiency anemia, unspecified: Secondary | ICD-10-CM | POA: Diagnosis not present

## 2021-02-16 LAB — CMP (CANCER CENTER ONLY)
ALT: 15 U/L (ref 0–44)
AST: 39 U/L (ref 15–41)
Albumin: 4.1 g/dL (ref 3.5–5.0)
Alkaline Phosphatase: 142 U/L — ABNORMAL HIGH (ref 38–126)
Anion gap: 10 (ref 5–15)
BUN: 15 mg/dL (ref 8–23)
CO2: 36 mmol/L — ABNORMAL HIGH (ref 22–32)
Calcium: 9.6 mg/dL (ref 8.9–10.3)
Chloride: 92 mmol/L — ABNORMAL LOW (ref 98–111)
Creatinine: 0.52 mg/dL (ref 0.44–1.00)
GFR, Estimated: >60 mL/min (ref >60)
Glucose, Bld: 149 mg/dL — ABNORMAL HIGH (ref 70–99)
Potassium: 3.1 mmol/L — ABNORMAL LOW (ref 3.5–5.1)
Sodium: 138 mmol/L (ref 135–145)
Total Bilirubin: 0.6 mg/dL (ref 0.3–1.2)
Total Protein: 7 g/dL (ref 6.5–8.1)

## 2021-02-16 LAB — CBC WITH DIFFERENTIAL (CANCER CENTER ONLY)
Abs Immature Granulocytes: 0.01 10*3/uL (ref 0.00–0.07)
Basophils Absolute: 0 10*3/uL (ref 0.0–0.1)
Basophils Relative: 0 %
Eosinophils Absolute: 0 10*3/uL (ref 0.0–0.5)
Eosinophils Relative: 0 %
HCT: 46.7 % — ABNORMAL HIGH (ref 36.0–46.0)
Hemoglobin: 15.2 g/dL — ABNORMAL HIGH (ref 12.0–15.0)
Immature Granulocytes: 0 %
Lymphocytes Relative: 9 %
Lymphs Abs: 0.6 10*3/uL — ABNORMAL LOW (ref 0.7–4.0)
MCH: 28.4 pg (ref 26.0–34.0)
MCHC: 32.5 g/dL (ref 30.0–36.0)
MCV: 87.3 fL (ref 80.0–100.0)
Monocytes Absolute: 0.6 10*3/uL (ref 0.1–1.0)
Monocytes Relative: 9 %
Neutro Abs: 5.7 10*3/uL (ref 1.7–7.7)
Neutrophils Relative %: 82 %
Platelet Count: 123 10*3/uL — ABNORMAL LOW (ref 150–400)
RBC: 5.35 MIL/uL — ABNORMAL HIGH (ref 3.87–5.11)
RDW: 14.4 % (ref 11.5–15.5)
WBC Count: 7 10*3/uL (ref 4.0–10.5)
nRBC: 0 % (ref 0.0–0.2)

## 2021-02-16 LAB — PROTIME-INR
INR: 3.9 — ABNORMAL HIGH (ref 0.8–1.2)
Prothrombin Time: 38.5 seconds — ABNORMAL HIGH (ref 11.4–15.2)

## 2021-02-16 LAB — CEA (ACCESS): CEA (CHCC): 1.57 ng/mL (ref 0.00–5.00)

## 2021-02-16 MED ORDER — PALONOSETRON HCL INJECTION 0.25 MG/5ML
0.2500 mg | Freq: Once | INTRAVENOUS | Status: AC
  Administered 2021-02-16: 0.25 mg via INTRAVENOUS
  Filled 2021-02-16: qty 5

## 2021-02-16 MED ORDER — POTASSIUM CHLORIDE CRYS ER 20 MEQ PO TBCR: 20.0000 meq | EXTENDED_RELEASE_TABLET | Freq: Every day | ORAL | 1 refills | Status: AC

## 2021-02-16 MED ORDER — ATROPINE SULFATE 1 MG/ML IV SOLN
0.5000 mg | Freq: Once | INTRAVENOUS | Status: DC | PRN
  Filled 2021-02-16: qty 1

## 2021-02-16 MED ORDER — SODIUM CHLORIDE 0.9 % IV SOLN: Freq: Once | INTRAVENOUS | Status: AC

## 2021-02-16 MED ORDER — SODIUM CHLORIDE 0.9 % IV SOLN
2400.0000 mg/m2 | INTRAVENOUS | Status: DC
  Administered 2021-02-16: 4000 mg via INTRAVENOUS
  Filled 2021-02-16: qty 80

## 2021-02-16 MED ORDER — SODIUM CHLORIDE 0.9 % IV SOLN
180.0000 mg/m2 | Freq: Once | INTRAVENOUS | Status: AC
  Administered 2021-02-16: 300 mg via INTRAVENOUS
  Filled 2021-02-16: qty 15

## 2021-02-16 MED ORDER — SODIUM CHLORIDE 0.9 % IV SOLN
400.0000 mg/m2 | Freq: Once | INTRAVENOUS | Status: AC
  Administered 2021-02-16: 668 mg via INTRAVENOUS
  Filled 2021-02-16: qty 33.4

## 2021-02-16 MED ORDER — SODIUM CHLORIDE 0.9 % IV SOLN
10.0000 mg | Freq: Once | INTRAVENOUS | Status: AC
  Administered 2021-02-16: 10 mg via INTRAVENOUS
  Filled 2021-02-16: qty 1

## 2021-02-16 MED ORDER — FLUOROURACIL CHEMO INJECTION 2.5 GM/50ML
400.0000 mg/m2 | Freq: Once | INTRAVENOUS | Status: AC
  Administered 2021-02-16: 650 mg via INTRAVENOUS
  Filled 2021-02-16: qty 13

## 2021-02-16 NOTE — Progress Notes (Signed)
Patient presents for treatment. RN assessment completed along with the following:  Labs/vitals reviewed - Yes, and within treatment parameters.   Weight within 10% of previous measurement - Yes Oncology Treatment Attestation completed for current therapy- Yes, on date 02/04/21 Informed consent completed and reflects current therapy/intent - Yes, on date 02/16/21             Provider progress note reviewed - Yes, today's provider note was reviewed. Treatment/Antibody/Supportive plan reviewed - Yes, and there are no adjustments needed for today's treatment. S&H and other orders reviewed - Yes, and there are no additional orders identified. Previous treatment date reviewed - Yes, and the appropriate amount of time has elapsed between treatments. Clinic Hand Off Received from - Bevely Palmer, LPN   Patient to proceed with treatment.

## 2021-02-16 NOTE — Progress Notes (Signed)
[place] OFFICE PROGRESS NOTE   Diagnosis: Colon cancer  INTERVAL HISTORY:   Sabrina [name] returns as scheduled.  She remains weak.  Continued intermittent nausea/vomiting.  She had vivid dreams with oxycodone.  She has discontinued it and is taking ibuprofen for pain.  She denies bleeding.  Bowels are moving.  Objective:  Vital signs in last 24 hours:  Blood pressure 120/80, pulse 100, resp. rate 18, height $RemoveBe'5\' 10"'lOoACGdmY$  (1.778 m), weight 124 lb (56.2 kg), SpO2 98 %.    HEENT: No thrush or ulcers. Resp: Lungs clear bilaterally. Cardio: Regular, occasional premature beat. GI: Tender mid upper abdomen with associated fullness. Vascular: No leg edema. Port-A-Cath without erythema.  Lab Results:  Lab Results  Component Value Date   WBC 7.0 02/16/2021   HGB 15.2 (H) 02/16/2021   HCT 46.7 (H) 02/16/2021   MCV 87.3 02/16/2021   PLT 123 (L) 02/16/2021   NEUTROABS 5.7 02/16/2021    Imaging:  No results found.  Medications: I have reviewed the patient's current medications.  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, lymphovascular invasion present including large vessel extramural invasion, loss of MLH1 and PMS2, MLH1 hyper methylation present Incomplete colonoscopy secondary to stricturing at the sigmoid colon Virtual colonoscopy 03/13/2020- mass centered at the medial wall of the cecum and terminal ileum, adjacent ileocolic mesenteric adenopathy, nonspecific 3 mm right lower lobe nodule PET 88/32/5498 hypermetabolic cecal/terminal ileum mass with adjacent enlarged and hypermetabolic ileocolic lymph nodes, previously noted segment 7 liver lesion-not hypermetabolic- benign etiology favored, dominant left lower lobe pulmonary nodule not hypermetabolic, other tiny nodules not hypermetabolic and below PET sensitivity Cycle 1 FOLFOX 05/12/2020 Cycle 2  FOLFOX 05/26/2020 Cycle 3 FOLFOX 06/09/2020 Cycle 4 FOLFOX 06/23/2020 Cycle 5 FOLFOX 07/07/2020 Cycle 12 FOLFOX 10/14/2020, oxaliplatin dose reduced beginning with cycle 9 CT 12/04/2020-development of peripheral enhancing hypoattenuating mass in segment 5/8, necrotic porta hepatis lymphadenopathy, similar appearance of previously visualized nonhypermetabolic subcapsular segment 7 lesion Ultrasound-guided biopsy right liver lesion 12/22/2020-adenocarcinoma consistent with a colon primary PET scan 12/30/2020-hypermetabolic hepatic lesion and portal adenopathy-increased in size compared to 12/04/2020 CT, small hypermetabolic left periaortic node Foundation 1-MSI high, tumor mutation burden 31, K-ras wild-type Cycle 1 Pembrolizumab 01/15/2021 Cycle 1 FOLFIRI 02/16/2021 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    Disposition: Sabrina [name] appears unchanged.  She is scheduled to begin treatment today with FOLFIRI.  She agrees to proceed.  We reviewed the CBC and chemistry panel from today.  Labs adequate to proceed with treatment.  She has hypokalemia.  Prescription for a potassium supplement sent to her pharmacy.  We will obtain a baseline CEA today and also check PT/INR.  She will return for lab, follow-up, cycle 2 FOLFIRI in 2 weeks.  We are available to see her sooner if needed.  Patient seen with Dr. Benay Spice.  Ned Card ANP/GNP-BC   02/16/2021  10:33 AM This was a shared visit with Ned Card.  Sabrina [name] was interviewed and examined.  She continues to have abdominal pain  secondary to progression of metastatic colon cancer.  The plan is to begin FOLFIRI chemotherapy today.  We will check the  PT/INR while she is being treated with FOLFIRI.  Chemotherapy doses were adjusted to her current weight.  I was present for greater than 50% of today's visit.  I performed medical decision making.  Julieanne Manson, MD

## 2021-02-16 NOTE — Progress Notes (Signed)
Patient seen by Lisa Thomas NP today  Vitals are within treatment parameters.  Labs reviewed by Lisa Thomas NP and are within treatment parameters.  Per physician team, patient is ready for treatment and there are NO modifications to the treatment plan.     

## 2021-02-16 NOTE — Telephone Encounter (Signed)
I attempted to contact [name] regarding the elevated INR, 3.9.  Message left on voicemail to hold Coumadin and we will repeat the PT/INR when she is here on 02/18/2021 for pump discontinuation.  I requested she contact the office tomorrow to let us know she got this message.  We will attempt to contact her again as well.

## 2021-02-16 NOTE — Patient Instructions (Addendum)
The chemotherapy medication bag should finish at 46 hours, 96 hours, or 7 days. For example, if your pump is scheduled for 46 hours and it was put on at 4:00 p.m., it should finish at 2:00 p.m. the day it is scheduled to come off regardless of your appointment time.     Estimated time to finish at 66, Wednesday February 18, 2021.   If the display on your pump reads "Low Volume" and it is beeping, take the batteries out of the pump and come to the cancer center for it to be taken off.   If the pump alarms go off prior to the pump reading "Low Volume" then call [phone] and someone can assist you.  If the plunger comes out and the chemotherapy medication is leaking out, please use your home chemo spill kit to clean up the spill. Do NOT use paper towels or other household products.  If you have problems or questions regarding your pump, please call either [phone] (24 hours a day) or the cancer center Monday-Friday 8:00 a.m.- 4:30 p.m. at the clinic number and we will assist you. If you are unable to get assistance, then go to the nearest Emergency Department and ask the staff to contact the IV team for assistance.  [facility]   Discharge Instructions: Thank you for choosing [facility] to provide your oncology and hematology care.   If you have a lab appointment with the [facility], please go directly to the [facility] and check in at the registration area.   Wear comfortable clothing and clothing appropriate for easy access to any Portacath or PICC line.   We strive to give you quality time with your provider. You may need to reschedule your appointment if you arrive late (15 or more minutes).  Arriving late affects you and other patients whose appointments are after yours.  Also, if you miss three or more appointments without notifying the office, you may be dismissed from the clinic at the provider's discretion.      For prescription  refill requests, have your pharmacy contact our office and allow 72 hours for refills to be completed.    Today you received the following chemotherapy and/or immunotherapy agents irinotecan, leucovorin, fluorouracil     To help prevent nausea and vomiting after your treatment, we encourage you to take your nausea medication as directed.  BELOW ARE SYMPTOMS THAT SHOULD BE REPORTED IMMEDIATELY: *FEVER GREATER THAN 100.4 F (38 C) OR HIGHER *CHILLS OR SWEATING *NAUSEA AND VOMITING THAT IS NOT CONTROLLED WITH YOUR NAUSEA MEDICATION *UNUSUAL SHORTNESS OF BREATH *UNUSUAL BRUISING OR BLEEDING *URINARY PROBLEMS (pain or burning when urinating, or frequent urination) *BOWEL PROBLEMS (unusual diarrhea, constipation, pain near the anus) TENDERNESS IN MOUTH AND THROAT WITH OR WITHOUT PRESENCE OF ULCERS (sore throat, sores in mouth, or a toothache) UNUSUAL RASH, SWELLING OR PAIN  UNUSUAL VAGINAL DISCHARGE OR ITCHING   Items with * indicate a potential emergency and should be followed up as soon as possible or go to the Emergency Department if any problems should occur.  Please show the CHEMOTHERAPY ALERT CARD or IMMUNOTHERAPY ALERT CARD at check-in to the Emergency Department and triage nurse.  Should you have questions after your visit or need to cancel or reschedule your appointment, please contact [facility]  Dept: [phone]  and follow the prompts.  Office hours are 8:00 a.m. to 4:30 p.m. Monday - Friday. Please note that voicemails left  after 4:00 p.m. may not be returned until the following business day.  We are closed weekends and major holidays. You have access to a nurse at all times for urgent questions. Please call the main number to the clinic Dept: [phone] and follow the prompts.   For any non-urgent questions, you may also contact your provider using MyChart. We now offer e-Visits for anyone 81 and older to request care online for non-urgent symptoms.  For details visit [email or web address].   Also download the MyChart app! Go to the app store, search "MyChart", open the app, select [facility], and log in with your MyChart username and password.  Due to Covid, a mask is required upon entering the hospital/clinic. If you do not have a mask, one will be given to you upon arrival. For doctor visits, patients may have 1 support person aged 10 or older with them. For treatment visits, patients cannot have anyone with them due to current Covid guidelines and our immunocompromised population.   Irinotecan injection What is this medication? IRINOTECAN (ir in oh TEE kan ) is a chemotherapy drug. It is used to treat colon and rectal cancer. This medicine may be used for other purposes; ask your health care provider or pharmacist if you have questions. COMMON BRAND NAME(S): Camptosar What should I tell my care team before I take this medication? They need to know if you have any of these conditions: dehydration diarrhea infection (especially a virus infection such as chickenpox, cold sores, or herpes) liver disease low blood counts, like low white cell, platelet, or red cell counts low levels of calcium, magnesium, or potassium in the blood recent or ongoing radiation therapy an unusual or allergic reaction to irinotecan, other medicines, foods, dyes, or preservatives pregnant or trying to get pregnant breast-feeding How should I use this medication? This drug is given as an infusion into a vein. It is administered in a hospital or clinic by a specially trained health care professional. Talk to your pediatrician regarding the use of this medicine in children. Special care may be needed. Overdosage: If you think you have taken too much of this medicine contact a poison control center or emergency room at once. NOTE: This medicine is only for you. Do not share this medicine with others. What if I miss a dose? It is important not to miss your  dose. Call your doctor or health care professional if you are unable to keep an appointment. What may interact with this medication? Do not take this medicine with any of the following medications: cobicistat itraconazole This medicine may interact with the following medications: antiviral medicines for HIV or AIDS certain antibiotics like rifampin or rifabutin certain medicines for fungal infections like ketoconazole, posaconazole, and voriconazole certain medicines for seizures like carbamazepine, phenobarbital, phenotoin clarithromycin gemfibrozil nefazodone [facility]. John's Wort This list may not describe all possible interactions. Give your health care provider a list of all the medicines, herbs, non-prescription drugs, or dietary supplements you use. Also tell them if you smoke, drink alcohol, or use illegal drugs. Some items may interact with your medicine. What should I watch for while using this medication? Your condition will be monitored carefully while you are receiving this medicine. You will need important blood work done while you are taking this medicine. This drug may make you feel generally unwell. This is not uncommon, as chemotherapy can affect healthy cells as well as cancer cells. Report any side effects. Continue your course of treatment even though you feel  ill unless your doctor tells you to stop. In some cases, you may be given additional medicines to help with side effects. Follow all directions for their use. You may get drowsy or dizzy. Do not drive, use machinery, or do anything that needs mental alertness until you know how this medicine affects you. Do not stand or sit up quickly, especially if you are an older patient. This reduces the risk of dizzy or fainting spells. Call your health care professional for advice if you get a fever, chills, or sore throat, or other symptoms of a cold or flu. Do not treat yourself. This medicine decreases your body's ability to fight  infections. Try to avoid being around people who are sick. Avoid taking products that contain aspirin, acetaminophen, ibuprofen, naproxen, or ketoprofen unless instructed by your doctor. These medicines may hide a fever. This medicine may increase your risk to bruise or bleed. Call your doctor or health care professional if you notice any unusual bleeding. Be careful brushing and flossing your teeth or using a toothpick because you may get an infection or bleed more easily. If you have any dental work done, tell your dentist you are receiving this medicine. Do not become pregnant while taking this medicine or for 6 months after stopping it. Women should inform their health care professional if they wish to become pregnant or think they might be pregnant. Men should not father a child while taking this medicine and for 3 months after stopping it. There is potential for serious side effects to an unborn child. Talk to your health care professional for more information. Do not breast-feed an infant while taking this medicine or for 7 days after stopping it. This medicine has caused ovarian failure in some women. This medicine may make it more difficult to get pregnant. Talk to your health care professional if you are concerned about your fertility. This medicine has caused decreased sperm counts in some men. This may make it more difficult to father a child. Talk to your health care professional if you are concerned about your fertility. What side effects may I notice from receiving this medication? Side effects that you should report to your doctor or health care professional as soon as possible: allergic reactions like skin rash, itching or hives, swelling of the face, lips, or tongue chest pain diarrhea flushing, runny nose, sweating during infusion low blood counts - this medicine may decrease the number of white blood cells, red blood cells and platelets. You may be at increased risk for infections  and bleeding. nausea, vomiting pain, swelling, warmth in the leg signs of decreased platelets or bleeding - bruising, pinpoint red spots on the skin, black, tarry stools, blood in the urine signs of infection - fever or chills, cough, sore throat, pain or difficulty passing urine signs of decreased red blood cells - unusually weak or tired, fainting spells, lightheadedness Side effects that usually do not require medical attention (report to your doctor or health care professional if they continue or are bothersome): constipation hair loss headache loss of appetite mouth sores stomach pain This list may not describe all possible side effects. Call your doctor for medical advice about side effects. You may report side effects to FDA at 1-800-FDA-1088. Where should I keep my medication? This drug is given in a hospital or clinic and will not be stored at home. NOTE: This sheet is a summary. It may not cover all possible information. If you have questions about this medicine, talk  to your doctor, pharmacist, or health care provider.  2022 [employer]vier/Gold Standard (2020-12-16 00:00:00)  Leucovorin injection What is this medication? LEUCOVORIN (loo koe VOR in) is used to prevent or treat the harmful effects of some medicines. This medicine is used to treat anemia caused by a low amount of folic acid in the body. It is also used with 5-fluorouracil (5-FU) to treat colon cancer. This medicine may be used for other purposes; ask your health care provider or pharmacist if you have questions. What should I tell my care team before I take this medication? They need to know if you have any of these conditions: anemia from low levels of vitamin B-12 in the blood an unusual or allergic reaction to leucovorin, folic acid, other medicines, foods, dyes, or preservatives pregnant or trying to get pregnant breast-feeding How should I use this medication? This medicine is for injection into a muscle or into  a vein. It is given by a health care professional in a hospital or clinic setting. Talk to your pediatrician regarding the use of this medicine in children. Special care may be needed. Overdosage: If you think you have taken too much of this medicine contact a poison control center or emergency room at once. NOTE: This medicine is only for you. Do not share this medicine with others. What if I miss a dose? This does not apply. What may interact with this medication? capecitabine fluorouracil phenobarbital phenytoin primidone trimethoprim-sulfamethoxazole This list may not describe all possible interactions. Give your health care provider a list of all the medicines, herbs, non-prescription drugs, or dietary supplements you use. Also tell them if you smoke, drink alcohol, or use illegal drugs. Some items may interact with your medicine. What should I watch for while using this medication? Your condition will be monitored carefully while you are receiving this medicine. This medicine may increase the side effects of 5-fluorouracil, 5-FU. Tell your doctor or health care professional if you have diarrhea or mouth sores that do not get better or that get worse. What side effects may I notice from receiving this medication? Side effects that you should report to your doctor or health care professional as soon as possible: allergic reactions like skin rash, itching or hives, swelling of the face, lips, or tongue breathing problems fever, infection mouth sores unusual bleeding or bruising unusually weak or tired Side effects that usually do not require medical attention (report to your doctor or health care professional if they continue or are bothersome): constipation or diarrhea loss of appetite nausea, vomiting This list may not describe all possible side effects. Call your doctor for medical advice about side effects. You may report side effects to FDA at 1-800-FDA-1088. Where should I keep  my medication? This drug is given in a hospital or clinic and will not be stored at home. NOTE: This sheet is a summary. It may not cover all possible information. If you have questions about this medicine, talk to your doctor, pharmacist, or health care provider.  2022 Elsevier/Gold Standard (2007-10-05 00:00:00)  Fluorouracil, 5-FU injection What is this medication? FLUOROURACIL, 5-FU (flure oh YOOR a sil) is a chemotherapy drug. It slows the growth of cancer cells. This medicine is used to treat many types of cancer like breast cancer, colon or rectal cancer, pancreatic cancer, and stomach cancer. This medicine may be used for other purposes; ask your health care provider or pharmacist if you have questions. COMMON BRAND NAME(S): Adrucil What should I tell my care team before  I take this medication? They need to know if you have any of these conditions: blood disorders dihydropyrimidine dehydrogenase (DPD) deficiency infection (especially a virus infection such as chickenpox, cold sores, or herpes) kidney disease liver disease malnourished, poor nutrition recent or ongoing radiation therapy an unusual or allergic reaction to fluorouracil, other chemotherapy, other medicines, foods, dyes, or preservatives pregnant or trying to get pregnant breast-feeding How should I use this medication? This drug is given as an infusion or injection into a vein. It is administered in a hospital or clinic by a specially trained health care professional. Talk to your pediatrician regarding the use of this medicine in children. Special care may be needed. Overdosage: If you think you have taken too much of this medicine contact a poison control center or emergency room at once. NOTE: This medicine is only for you. Do not share this medicine with others. What if I miss a dose? It is important not to miss your dose. Call your doctor or health care professional if you are unable to keep an  appointment. What may interact with this medication? Do not take this medicine with any of the following medications: live virus vaccines This medicine may also interact with the following medications: medicines that treat or prevent blood clots like warfarin, enoxaparin, and dalteparin This list may not describe all possible interactions. Give your health care provider a list of all the medicines, herbs, non-prescription drugs, or dietary supplements you use. Also tell them if you smoke, drink alcohol, or use illegal drugs. Some items may interact with your medicine. What should I watch for while using this medication? Visit your doctor for checks on your progress. This drug may make you feel generally unwell. This is not uncommon, as chemotherapy can affect healthy cells as well as cancer cells. Report any side effects. Continue your course of treatment even though you feel ill unless your doctor tells you to stop. In some cases, you may be given additional medicines to help with side effects. Follow all directions for their use. Call your doctor or health care professional for advice if you get a fever, chills or sore throat, or other symptoms of a cold or flu. Do not treat yourself. This drug decreases your body's ability to fight infections. Try to avoid being around people who are sick. This medicine may increase your risk to bruise or bleed. Call your doctor or health care professional if you notice any unusual bleeding. Be careful brushing and flossing your teeth or using a toothpick because you may get an infection or bleed more easily. If you have any dental work done, tell your dentist you are receiving this medicine. Avoid taking products that contain aspirin, acetaminophen, ibuprofen, naproxen, or ketoprofen unless instructed by your doctor. These medicines may hide a fever. Do not become pregnant while taking this medicine. Women should inform their doctor if they wish to become pregnant  or think they might be pregnant. There is a potential for serious side effects to an unborn child. Talk to your health care professional or pharmacist for more information. Do not breast-feed an infant while taking this medicine. Men should inform their doctor if they wish to father a child. This medicine may lower sperm counts. Do not treat diarrhea with over the counter products. Contact your doctor if you have diarrhea that lasts more than 2 days or if it is severe and watery. This medicine can make you more sensitive to the sun. Keep out of the sun.  If you cannot avoid being in the sun, wear protective clothing and use sunscreen. Do not use sun lamps or tanning beds/booths. What side effects may I notice from receiving this medication? Side effects that you should report to your doctor or health care professional as soon as possible: allergic reactions like skin rash, itching or hives, swelling of the face, lips, or tongue low blood counts - this medicine may decrease the number of white blood cells, red blood cells and platelets. You may be at increased risk for infections and bleeding. signs of infection - fever or chills, cough, sore throat, pain or difficulty passing urine signs of decreased platelets or bleeding - bruising, pinpoint red spots on the skin, black, tarry stools, blood in the urine signs of decreased red blood cells - unusually weak or tired, fainting spells, lightheadedness breathing problems changes in vision chest pain mouth sores nausea and vomiting pain, swelling, redness at site where injected pain, tingling, numbness in the hands or feet redness, swelling, or sores on hands or feet stomach pain unusual bleeding Side effects that usually do not require medical attention (report to your doctor or health care professional if they continue or are bothersome): changes in finger or toe nails diarrhea dry or itchy skin hair loss headache loss of appetite sensitivity  of eyes to the light stomach upset unusually teary eyes This list may not describe all possible side effects. Call your doctor for medical advice about side effects. You may report side effects to FDA at 1-800-FDA-1088. Where should I keep my medication? This drug is given in a hospital or clinic and will not be stored at home. NOTE: This sheet is a summary. It may not cover all possible information. If you have questions about this medicine, talk to your doctor, pharmacist, or health care provider.  2022 Elsevier/Gold Standard (2020-12-16 00:00:00)

## 2021-02-17 ENCOUNTER — Other Ambulatory Visit: Payer: Medicare Other

## 2021-02-17 ENCOUNTER — Telehealth: Payer: Self-pay | Admitting: Oncology

## 2021-02-17 ENCOUNTER — Telehealth: Payer: Self-pay | Admitting: *Deleted

## 2021-02-17 ENCOUNTER — Telehealth: Payer: Self-pay

## 2021-02-17 NOTE — Telephone Encounter (Signed)
Called patient to confirm she got VM regarding elevated INR. She confirms she did and started holding her warfarin last night. Will f/u on 11/9 as scheduled to recheck. Scheduling message sent.

## 2021-02-17 NOTE — Telephone Encounter (Signed)
Scheduled appointment per 11/08 sch msg. Patient aware.

## 2021-02-17 NOTE — Telephone Encounter (Signed)
Called patient for first time chemo follow-up. Left VM for patient to call office if she had any questions or concerns. Reminded patient of appt for pump stop on 02/18/21.

## 2021-02-18 ENCOUNTER — Inpatient Hospital Stay: Payer: Medicare Other

## 2021-02-18 ENCOUNTER — Ambulatory Visit: Payer: Medicare Other | Admitting: Hematology and Oncology

## 2021-02-18 ENCOUNTER — Other Ambulatory Visit: Payer: Self-pay

## 2021-02-18 VITALS — BP 119/76 | HR 84 | Temp 98.0°F | Resp 20

## 2021-02-18 DIAGNOSIS — Z170 Estrogen receptor positive status [ER+]: Secondary | ICD-10-CM

## 2021-02-18 DIAGNOSIS — Z5111 Encounter for antineoplastic chemotherapy: Secondary | ICD-10-CM | POA: Diagnosis not present

## 2021-02-18 DIAGNOSIS — C189 Malignant neoplasm of colon, unspecified: Secondary | ICD-10-CM

## 2021-02-18 DIAGNOSIS — C50411 Malignant neoplasm of upper-outer quadrant of right female breast: Secondary | ICD-10-CM

## 2021-02-18 DIAGNOSIS — C787 Secondary malignant neoplasm of liver and intrahepatic bile duct: Secondary | ICD-10-CM

## 2021-02-18 DIAGNOSIS — I480 Paroxysmal atrial fibrillation: Secondary | ICD-10-CM

## 2021-02-18 LAB — PROTIME-INR
INR: 2.8 — ABNORMAL HIGH (ref 0.8–1.2)
Prothrombin Time: 29.5 seconds — ABNORMAL HIGH (ref 11.4–15.2)

## 2021-02-18 MED ORDER — SODIUM CHLORIDE 0.9% FLUSH
10.0000 mL | INTRAVENOUS | Status: DC | PRN
  Administered 2021-02-18: 10 mL

## 2021-02-18 MED ORDER — HEPARIN SOD (PORK) LOCK FLUSH 100 UNIT/ML IV SOLN
500.0000 [IU] | Freq: Once | INTRAVENOUS | Status: AC | PRN
  Administered 2021-02-18: 500 [IU]

## 2021-02-19 ENCOUNTER — Other Ambulatory Visit: Payer: Self-pay | Admitting: Cardiology

## 2021-02-20 ENCOUNTER — Other Ambulatory Visit: Payer: Self-pay | Admitting: Nurse Practitioner

## 2021-02-20 ENCOUNTER — Telehealth: Payer: Self-pay | Admitting: Nurse Practitioner

## 2021-02-20 DIAGNOSIS — C189 Malignant neoplasm of colon, unspecified: Secondary | ICD-10-CM

## 2021-02-20 NOTE — Telephone Encounter (Signed)
I contacted [name] regarding the PT/INR from 02/18/2021.  INR is in therapeutic range.  She is feeling much better.  Eating/drinking has improved.  Pain is better.  She will resume Coumadin at the previous dose.  We will contact Dr. Wendy Poet office in [place] to see if they can check the PT/INR next week.

## 2021-03-01 ENCOUNTER — Other Ambulatory Visit: Payer: Self-pay | Admitting: Oncology

## 2021-03-02 ENCOUNTER — Inpatient Hospital Stay: Payer: Medicare Other

## 2021-03-02 ENCOUNTER — Inpatient Hospital Stay (HOSPITAL_BASED_OUTPATIENT_CLINIC_OR_DEPARTMENT_OTHER): Payer: Medicare Other | Admitting: Oncology

## 2021-03-02 ENCOUNTER — Other Ambulatory Visit: Payer: Self-pay

## 2021-03-02 VITALS — BP 144/72 | HR 69

## 2021-03-02 VITALS — BP 146/80 | HR 77 | Temp 98.1°F | Resp 18 | Ht 70.0 in | Wt 135.6 lb

## 2021-03-02 DIAGNOSIS — C50411 Malignant neoplasm of upper-outer quadrant of right female breast: Secondary | ICD-10-CM

## 2021-03-02 DIAGNOSIS — Z5111 Encounter for antineoplastic chemotherapy: Secondary | ICD-10-CM | POA: Diagnosis not present

## 2021-03-02 DIAGNOSIS — C787 Secondary malignant neoplasm of liver and intrahepatic bile duct: Secondary | ICD-10-CM

## 2021-03-02 DIAGNOSIS — C189 Malignant neoplasm of colon, unspecified: Secondary | ICD-10-CM

## 2021-03-02 DIAGNOSIS — Z170 Estrogen receptor positive status [ER+]: Secondary | ICD-10-CM

## 2021-03-02 DIAGNOSIS — Z95828 Presence of other vascular implants and grafts: Secondary | ICD-10-CM

## 2021-03-02 LAB — CBC WITH DIFFERENTIAL (CANCER CENTER ONLY)
Abs Immature Granulocytes: 0.01 10*3/uL (ref 0.00–0.07)
Basophils Absolute: 0 10*3/uL (ref 0.0–0.1)
Basophils Relative: 0 %
Eosinophils Absolute: 0 10*3/uL (ref 0.0–0.5)
Eosinophils Relative: 1 %
HCT: 40.3 % (ref 36.0–46.0)
Hemoglobin: 13 g/dL (ref 12.0–15.0)
Immature Granulocytes: 0 %
Lymphocytes Relative: 20 %
Lymphs Abs: 0.6 10*3/uL — ABNORMAL LOW (ref 0.7–4.0)
MCH: 28.8 pg (ref 26.0–34.0)
MCHC: 32.3 g/dL (ref 30.0–36.0)
MCV: 89.2 fL (ref 80.0–100.0)
Monocytes Absolute: 0.3 10*3/uL (ref 0.1–1.0)
Monocytes Relative: 9 %
Neutro Abs: 1.9 10*3/uL (ref 1.7–7.7)
Neutrophils Relative %: 70 %
Platelet Count: 102 10*3/uL — ABNORMAL LOW (ref 150–400)
RBC: 4.52 MIL/uL (ref 3.87–5.11)
RDW: 15 % (ref 11.5–15.5)
WBC Count: 2.8 10*3/uL — ABNORMAL LOW (ref 4.0–10.5)
nRBC: 0 % (ref 0.0–0.2)

## 2021-03-02 LAB — CMP (CANCER CENTER ONLY)
ALT: 17 U/L (ref 0–44)
AST: 24 U/L (ref 15–41)
Albumin: 4 g/dL (ref 3.5–5.0)
Alkaline Phosphatase: 171 U/L — ABNORMAL HIGH (ref 38–126)
Anion gap: 7 (ref 5–15)
BUN: 6 mg/dL — ABNORMAL LOW (ref 8–23)
CO2: 32 mmol/L (ref 22–32)
Calcium: 9.7 mg/dL (ref 8.9–10.3)
Chloride: 101 mmol/L (ref 98–111)
Creatinine: 0.41 mg/dL — ABNORMAL LOW (ref 0.44–1.00)
GFR, Estimated: >60 mL/min (ref >60)
Glucose, Bld: 117 mg/dL — ABNORMAL HIGH (ref 70–99)
Potassium: 3.5 mmol/L (ref 3.5–5.1)
Sodium: 140 mmol/L (ref 135–145)
Total Bilirubin: 0.6 mg/dL (ref 0.3–1.2)
Total Protein: 6.9 g/dL (ref 6.5–8.1)

## 2021-03-02 LAB — PROTIME-INR
INR: 3.2 — ABNORMAL HIGH (ref 0.8–1.2)
Prothrombin Time: 32.6 seconds — ABNORMAL HIGH (ref 11.4–15.2)

## 2021-03-02 MED ORDER — FLUOROURACIL CHEMO INJECTION 2.5 GM/50ML
400.0000 mg/m2 | Freq: Once | INTRAVENOUS | Status: AC
  Administered 2021-03-02: 650 mg via INTRAVENOUS
  Filled 2021-03-02: qty 13

## 2021-03-02 MED ORDER — SODIUM CHLORIDE 0.9 % IV SOLN
10.0000 mg | Freq: Once | INTRAVENOUS | Status: AC
  Administered 2021-03-02: 10 mg via INTRAVENOUS
  Filled 2021-03-02: qty 1

## 2021-03-02 MED ORDER — SODIUM CHLORIDE 0.9% FLUSH
10.0000 mL | INTRAVENOUS | Status: DC | PRN
  Administered 2021-03-02: 10 mL via INTRAVENOUS

## 2021-03-02 MED ORDER — SODIUM CHLORIDE 0.9 % IV SOLN
150.0000 mg | Freq: Once | INTRAVENOUS | Status: AC
  Administered 2021-03-02: 150 mg via INTRAVENOUS
  Filled 2021-03-02: qty 5

## 2021-03-02 MED ORDER — SODIUM CHLORIDE 0.9 % IV SOLN: Freq: Once | INTRAVENOUS | Status: AC

## 2021-03-02 MED ORDER — SODIUM CHLORIDE 0.9 % IV SOLN
180.0000 mg/m2 | Freq: Once | INTRAVENOUS | Status: AC
  Administered 2021-03-02: 300 mg via INTRAVENOUS
  Filled 2021-03-02: qty 15

## 2021-03-02 MED ORDER — PALONOSETRON HCL INJECTION 0.25 MG/5ML
0.2500 mg | Freq: Once | INTRAVENOUS | Status: AC
  Administered 2021-03-02: 0.25 mg via INTRAVENOUS
  Filled 2021-03-02: qty 5

## 2021-03-02 MED ORDER — SODIUM CHLORIDE 0.9 % IV SOLN
2400.0000 mg/m2 | INTRAVENOUS | Status: DC
  Administered 2021-03-02: 4000 mg via INTRAVENOUS
  Filled 2021-03-02: qty 80

## 2021-03-02 MED ORDER — ATROPINE SULFATE 1 MG/ML IV SOLN: 0.5000 mg | Freq: Once | INTRAVENOUS | Status: DC | PRN

## 2021-03-02 MED ORDER — SODIUM CHLORIDE 0.9 % IV SOLN
400.0000 mg/m2 | Freq: Once | INTRAVENOUS | Status: AC
  Administered 2021-03-02: 668 mg via INTRAVENOUS
  Filled 2021-03-02: qty 33.4

## 2021-03-02 NOTE — Patient Instructions (Addendum)
[facility]   The chemotherapy medication bag should finish at 46 hours, 96 hours, or 7 days. For example, if your pump is scheduled for 46 hours and it was put on at 4:00 p.m., it should finish at 2:00 p.m. the day it is scheduled to come off regardless of your appointment time.     Estimated time to finish at 2:30  Wednesday, March 04, 2021.   If the display on your pump reads "Low Volume" and it is beeping, take the batteries out of the pump and come to the cancer center for it to be taken off.   If the pump alarms go off prior to the pump reading "Low Volume" then call [phone] and someone can assist you.  If the plunger comes out and the chemotherapy medication is leaking out, please use your home chemo spill kit to clean up the spill. Do NOT use paper towels or other household products.  If you have problems or questions regarding your pump, please call either [phone] (24 hours a day) or the cancer center Monday-Friday 8:00 a.m.- 4:30 p.m. at the clinic number and we will assist you. If you are unable to get assistance, then go to the nearest Emergency Department and ask the staff to contact the IV team for assistance.  Discharge Instructions: Thank you for choosing [facility] to provide your oncology and hematology care.   If you have a lab appointment with the [facility], please go directly to the [facility] and check in at the registration area.   Wear comfortable clothing and clothing appropriate for easy access to any Portacath or PICC line.   We strive to give you quality time with your provider. You may need to reschedule your appointment if you arrive late (15 or more minutes).  Arriving late affects you and other patients whose appointments are after yours.  Also, if you miss three or more appointments without notifying the office, you may be dismissed from the clinic at the provider's discretion.      For  prescription refill requests, have your pharmacy contact our office and allow 72 hours for refills to be completed.    Today you received the following chemotherapy and/or immunotherapy agents Irinotecan, leucovorin, Fluorouracil      To help prevent nausea and vomiting after your treatment, we encourage you to take your nausea medication as directed.  BELOW ARE SYMPTOMS THAT SHOULD BE REPORTED IMMEDIATELY: *FEVER GREATER THAN 100.4 F (38 C) OR HIGHER *CHILLS OR SWEATING *NAUSEA AND VOMITING THAT IS NOT CONTROLLED WITH YOUR NAUSEA MEDICATION *UNUSUAL SHORTNESS OF BREATH *UNUSUAL BRUISING OR BLEEDING *URINARY PROBLEMS (pain or burning when urinating, or frequent urination) *BOWEL PROBLEMS (unusual diarrhea, constipation, pain near the anus) TENDERNESS IN MOUTH AND THROAT WITH OR WITHOUT PRESENCE OF ULCERS (sore throat, sores in mouth, or a toothache) UNUSUAL RASH, SWELLING OR PAIN  UNUSUAL VAGINAL DISCHARGE OR ITCHING   Items with * indicate a potential emergency and should be followed up as soon as possible or go to the Emergency Department if any problems should occur.  Please show the CHEMOTHERAPY ALERT CARD or IMMUNOTHERAPY ALERT CARD at check-in to the Emergency Department and triage nurse.  Should you have questions after your visit or need to cancel or reschedule your appointment, please contact [facility]  Dept: [phone]  and follow the prompts.  Office hours are 8:00 a.m. to 4:30 p.m. Monday - Friday. Please note that  voicemails left after 4:00 p.m. may not be returned until the following business day.  We are closed weekends and major holidays. You have access to a nurse at all times for urgent questions. Please call the main number to the clinic Dept: [phone] and follow the prompts.   For any non-urgent questions, you may also contact your provider using MyChart. We now offer e-Visits for anyone 70 and older to request care online for  non-urgent symptoms. For details visit [email or web address].   Also download the MyChart app! Go to the app store, search "MyChart", open the app, select [facility], and log in with your MyChart username and password.  Due to Covid, a mask is required upon entering the hospital/clinic. If you do not have a mask, one will be given to you upon arrival. For doctor visits, patients may have 1 support person aged 70 or older with them. For treatment visits, patients cannot have anyone with them due to current Covid guidelines and our immunocompromised population.   Irinotecan injection What is this medication? IRINOTECAN (ir in oh TEE kan ) is a chemotherapy drug. It is used to treat colon and rectal cancer. This medicine may be used for other purposes; ask your health care provider or pharmacist if you have questions. COMMON BRAND NAME(S): Camptosar What should I tell my care team before I take this medication? They need to know if you have any of these conditions: dehydration diarrhea infection (especially a virus infection such as chickenpox, cold sores, or herpes) liver disease low blood counts, like low white cell, platelet, or red cell counts low levels of calcium, magnesium, or potassium in the blood recent or ongoing radiation therapy an unusual or allergic reaction to irinotecan, other medicines, foods, dyes, or preservatives pregnant or trying to get pregnant breast-feeding How should I use this medication? This drug is given as an infusion into a vein. It is administered in a hospital or clinic by a specially trained health care professional. Talk to your pediatrician regarding the use of this medicine in children. Special care may be needed. Overdosage: If you think you have taken too much of this medicine contact a poison control center or emergency room at once. NOTE: This medicine is only for you. Do not share this medicine with others. What if I miss a dose? It is important  not to miss your dose. Call your doctor or health care professional if you are unable to keep an appointment. What may interact with this medication? Do not take this medicine with any of the following medications: cobicistat itraconazole This medicine may interact with the following medications: antiviral medicines for HIV or AIDS certain antibiotics like rifampin or rifabutin certain medicines for fungal infections like ketoconazole, posaconazole, and voriconazole certain medicines for seizures like carbamazepine, phenobarbital, phenotoin clarithromycin gemfibrozil nefazodone [facility]. John's Wort This list may not describe all possible interactions. Give your health care provider a list of all the medicines, herbs, non-prescription drugs, or dietary supplements you use. Also tell them if you smoke, drink alcohol, or use illegal drugs. Some items may interact with your medicine. What should I watch for while using this medication? Your condition will be monitored carefully while you are receiving this medicine. You will need important blood work done while you are taking this medicine. This drug may make you feel generally unwell. This is not uncommon, as chemotherapy can affect healthy cells as well as cancer cells. Report any side effects. Continue your course of treatment even though  you feel ill unless your doctor tells you to stop. In some cases, you may be given additional medicines to help with side effects. Follow all directions for their use. You may get drowsy or dizzy. Do not drive, use machinery, or do anything that needs mental alertness until you know how this medicine affects you. Do not stand or sit up quickly, especially if you are an older patient. This reduces the risk of dizzy or fainting spells. Call your health care professional for advice if you get a fever, chills, or sore throat, or other symptoms of a cold or flu. Do not treat yourself. This medicine decreases your body's  ability to fight infections. Try to avoid being around people who are sick. Avoid taking products that contain aspirin, acetaminophen, ibuprofen, naproxen, or ketoprofen unless instructed by your doctor. These medicines may hide a fever. This medicine may increase your risk to bruise or bleed. Call your doctor or health care professional if you notice any unusual bleeding. Be careful brushing and flossing your teeth or using a toothpick because you may get an infection or bleed more easily. If you have any dental work done, tell your dentist you are receiving this medicine. Do not become pregnant while taking this medicine or for 6 months after stopping it. Women should inform their health care professional if they wish to become pregnant or think they might be pregnant. Men should not father a child while taking this medicine and for 3 months after stopping it. There is potential for serious side effects to an unborn child. Talk to your health care professional for more information. Do not breast-feed an infant while taking this medicine or for 7 days after stopping it. This medicine has caused ovarian failure in some women. This medicine may make it more difficult to get pregnant. Talk to your health care professional if you are concerned about your fertility. This medicine has caused decreased sperm counts in some men. This may make it more difficult to father a child. Talk to your health care professional if you are concerned about your fertility. What side effects may I notice from receiving this medication? Side effects that you should report to your doctor or health care professional as soon as possible: allergic reactions like skin rash, itching or hives, swelling of the face, lips, or tongue chest pain diarrhea flushing, runny nose, sweating during infusion low blood counts - this medicine may decrease the number of white blood cells, red blood cells and platelets. You may be at increased risk  for infections and bleeding. nausea, vomiting pain, swelling, warmth in the leg signs of decreased platelets or bleeding - bruising, pinpoint red spots on the skin, black, tarry stools, blood in the urine signs of infection - fever or chills, cough, sore throat, pain or difficulty passing urine signs of decreased red blood cells - unusually weak or tired, fainting spells, lightheadedness Side effects that usually do not require medical attention (report to your doctor or health care professional if they continue or are bothersome): constipation hair loss headache loss of appetite mouth sores stomach pain This list may not describe all possible side effects. Call your doctor for medical advice about side effects. You may report side effects to FDA at 1-800-FDA-1088. Where should I keep my medication? This drug is given in a hospital or clinic and will not be stored at home. NOTE: This sheet is a summary. It may not cover all possible information. If you have questions about this  medicine, talk to your doctor, pharmacist, or health care provider.  2022 [employer] Standard (2020-12-16 00:00:00)  Leucovorin injection What is this medication? LEUCOVORIN (loo koe VOR in) is used to prevent or treat the harmful effects of some medicines. This medicine is used to treat anemia caused by a low amount of folic acid in the body. It is also used with 5-fluorouracil (5-FU) to treat colon cancer. This medicine may be used for other purposes; ask your health care provider or pharmacist if you have questions. What should I tell my care team before I take this medication? They need to know if you have any of these conditions: anemia from low levels of vitamin B-12 in the blood an unusual or allergic reaction to leucovorin, folic acid, other medicines, foods, dyes, or preservatives pregnant or trying to get pregnant breast-feeding How should I use this medication? This medicine is for injection into a  muscle or into a vein. It is given by a health care professional in a hospital or clinic setting. Talk to your pediatrician regarding the use of this medicine in children. Special care may be needed. Overdosage: If you think you have taken too much of this medicine contact a poison control center or emergency room at once. NOTE: This medicine is only for you. Do not share this medicine with others. What if I miss a dose? This does not apply. What may interact with this medication? capecitabine fluorouracil phenobarbital phenytoin primidone trimethoprim-sulfamethoxazole This list may not describe all possible interactions. Give your health care provider a list of all the medicines, herbs, non-prescription drugs, or dietary supplements you use. Also tell them if you smoke, drink alcohol, or use illegal drugs. Some items may interact with your medicine. What should I watch for while using this medication? Your condition will be monitored carefully while you are receiving this medicine. This medicine may increase the side effects of 5-fluorouracil, 5-FU. Tell your doctor or health care professional if you have diarrhea or mouth sores that do not get better or that get worse. What side effects may I notice from receiving this medication? Side effects that you should report to your doctor or health care professional as soon as possible: allergic reactions like skin rash, itching or hives, swelling of the face, lips, or tongue breathing problems fever, infection mouth sores unusual bleeding or bruising unusually weak or tired Side effects that usually do not require medical attention (report to your doctor or health care professional if they continue or are bothersome): constipation or diarrhea loss of appetite nausea, vomiting This list may not describe all possible side effects. Call your doctor for medical advice about side effects. You may report side effects to FDA at  1-800-FDA-1088. Where should I keep my medication? This drug is given in a hospital or clinic and will not be stored at home. NOTE: This sheet is a summary. It may not cover all possible information. If you have questions about this medicine, talk to your doctor, pharmacist, or health care provider.  2022 Else[employer]/Gold Standard (2007-10-05 00:00:00)  Fluorouracil, 5-FU injection What is this medication? FLUOROURACIL, 5-FU (flure oh YOOR a sil) is a chemotherapy drug. It slows the growth of cancer cells. This medicine is used to treat many types of cancer like breast cancer, colon or rectal cancer, pancreatic cancer, and stomach cancer. This medicine may be used for other purposes; ask your health care provider or pharmacist if you have questions. COMMON BRAND NAME(S): Adrucil What should I tell my care  team before I take this medication? They need to know if you have any of these conditions: blood disorders dihydropyrimidine dehydrogenase (DPD) deficiency infection (especially a virus infection such as chickenpox, cold sores, or herpes) kidney disease liver disease malnourished, poor nutrition recent or ongoing radiation therapy an unusual or allergic reaction to fluorouracil, other chemotherapy, other medicines, foods, dyes, or preservatives pregnant or trying to get pregnant breast-feeding How should I use this medication? This drug is given as an infusion or injection into a vein. It is administered in a hospital or clinic by a specially trained health care professional. Talk to your pediatrician regarding the use of this medicine in children. Special care may be needed. Overdosage: If you think you have taken too much of this medicine contact a poison control center or emergency room at once. NOTE: This medicine is only for you. Do not share this medicine with others. What if I miss a dose? It is important not to miss your dose. Call your doctor or health care professional if you  are unable to keep an appointment. What may interact with this medication? Do not take this medicine with any of the following medications: live virus vaccines This medicine may also interact with the following medications: medicines that treat or prevent blood clots like warfarin, enoxaparin, and dalteparin This list may not describe all possible interactions. Give your health care provider a list of all the medicines, herbs, non-prescription drugs, or dietary supplements you use. Also tell them if you smoke, drink alcohol, or use illegal drugs. Some items may interact with your medicine. What should I watch for while using this medication? Visit your doctor for checks on your progress. This drug may make you feel generally unwell. This is not uncommon, as chemotherapy can affect healthy cells as well as cancer cells. Report any side effects. Continue your course of treatment even though you feel ill unless your doctor tells you to stop. In some cases, you may be given additional medicines to help with side effects. Follow all directions for their use. Call your doctor or health care professional for advice if you get a fever, chills or sore throat, or other symptoms of a cold or flu. Do not treat yourself. This drug decreases your body's ability to fight infections. Try to avoid being around people who are sick. This medicine may increase your risk to bruise or bleed. Call your doctor or health care professional if you notice any unusual bleeding. Be careful brushing and flossing your teeth or using a toothpick because you may get an infection or bleed more easily. If you have any dental work done, tell your dentist you are receiving this medicine. Avoid taking products that contain aspirin, acetaminophen, ibuprofen, naproxen, or ketoprofen unless instructed by your doctor. These medicines may hide a fever. Do not become pregnant while taking this medicine. Women should inform their doctor if they  wish to become pregnant or think they might be pregnant. There is a potential for serious side effects to an unborn child. Talk to your health care professional or pharmacist for more information. Do not breast-feed an infant while taking this medicine. Men should inform their doctor if they wish to father a child. This medicine may lower sperm counts. Do not treat diarrhea with over the counter products. Contact your doctor if you have diarrhea that lasts more than 2 days or if it is severe and watery. This medicine can make you more sensitive to the sun. Keep out of  the sun. If you cannot avoid being in the sun, wear protective clothing and use sunscreen. Do not use sun lamps or tanning beds/booths. What side effects may I notice from receiving this medication? Side effects that you should report to your doctor or health care professional as soon as possible: allergic reactions like skin rash, itching or hives, swelling of the face, lips, or tongue low blood counts - this medicine may decrease the number of white blood cells, red blood cells and platelets. You may be at increased risk for infections and bleeding. signs of infection - fever or chills, cough, sore throat, pain or difficulty passing urine signs of decreased platelets or bleeding - bruising, pinpoint red spots on the skin, black, tarry stools, blood in the urine signs of decreased red blood cells - unusually weak or tired, fainting spells, lightheadedness breathing problems changes in vision chest pain mouth sores nausea and vomiting pain, swelling, redness at site where injected pain, tingling, numbness in the hands or feet redness, swelling, or sores on hands or feet stomach pain unusual bleeding Side effects that usually do not require medical attention (report to your doctor or health care professional if they continue or are bothersome): changes in finger or toe nails diarrhea dry or itchy skin hair loss headache loss  of appetite sensitivity of eyes to the light stomach upset unusually teary eyes This list may not describe all possible side effects. Call your doctor for medical advice about side effects. You may report side effects to FDA at 1-800-FDA-1088. Where should I keep my medication? This drug is given in a hospital or clinic and will not be stored at home. NOTE: This sheet is a summary. It may not cover all possible information. If you have questions about this medicine, talk to your doctor, pharmacist, or health care provider.  2022 [employer]/Gold Standard (2020-12-16 00:00:00)

## 2021-03-02 NOTE — Progress Notes (Signed)
[facility] OFFICE PROGRESS NOTE   Diagnosis: Colon cancer  INTERVAL HISTORY:   Sabrina [name] completed a cycle of FOLFIRI on 02/16/2021.  She had nausea and vomiting on day 2.  This improved by day 3.  No mouth sores or diarrhea.  Abdominal pain has almost completely resolved.  She has a good appetite.  She is gaining weight.  Constipation has improved.  Objective:  Vital signs in last 24 hours:  Blood pressure (!) 146/80, pulse 77, temperature 98.1 F (36.7 C), temperature source Oral, resp. rate 18, height 5' 10"  (1.778 m), weight 135 lb 9.6 oz (61.5 kg), SpO2 100 %.    HEENT: No thrush or ulcers Resp: Bilateral end inspiratory rhonchi/wheeze at the posterior chest, no respiratory distress Cardio: Regular rate and rhythm GI: No hepatosplenomegaly, mild tenderness in the right upper abdomen, no mass Vascular: No leg edema   Portacath/PICC-without erythema  Lab Results:  Lab Results  Component Value Date   WBC 2.8 (L) 03/02/2021   HGB 13.0 03/02/2021   HCT 40.3 03/02/2021   MCV 89.2 03/02/2021   PLT 102 (L) 03/02/2021   NEUTROABS 1.9 03/02/2021    CMP  Lab Results  Component Value Date   NA 138 02/16/2021   K 3.1 (L) 02/16/2021   CL 92 (L) 02/16/2021   CO2 36 (H) 02/16/2021   GLUCOSE 149 (H) 02/16/2021   BUN 15 02/16/2021   CREATININE 0.52 02/16/2021   CALCIUM 9.6 02/16/2021   PROT 7.0 02/16/2021   ALBUMIN 4.1 02/16/2021   AST 39 02/16/2021   ALT 15 02/16/2021   ALKPHOS 142 (H) 02/16/2021   BILITOT 0.6 02/16/2021   GFRNONAA >60 02/16/2021   GFRAA >60 08/17/2019    Lab Results  Component Value Date   CEA1 1.40 12/16/2020   CEA 1.57 02/16/2021    Lab Results  Component Value Date   INR 3.2 (H) 03/02/2021   LABPROT 32.6 (H) 03/02/2021     Medications: I have reviewed the patient's current medications.   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, stage IIIc pT3pN2b Moderately differentiated  adenocarcinoma, 9/30 lymph nodes positive, lymphovascular invasion present including large vessel extramural invasion, loss of MLH1 and PMS2, MLH1 hyper methylation present Incomplete colonoscopy secondary to stricturing at the sigmoid colon Virtual colonoscopy 03/13/2020- mass centered at the medial wall of the cecum and terminal ileum, adjacent ileocolic mesenteric adenopathy, nonspecific 3 mm right lower lobe nodule PET 56/25/6389 hypermetabolic cecal/terminal ileum mass with adjacent enlarged and hypermetabolic ileocolic lymph nodes, previously noted segment 7 liver lesion-not hypermetabolic- benign etiology favored, dominant left lower lobe pulmonary nodule not hypermetabolic, other tiny nodules not hypermetabolic and below PET sensitivity Cycle 1 FOLFOX 05/12/2020 Cycle 2 FOLFOX 05/26/2020 Cycle 3 FOLFOX 06/09/2020 Cycle 4 FOLFOX 06/23/2020 Cycle 5 FOLFOX 07/07/2020 Cycle 12 FOLFOX 10/14/2020, oxaliplatin dose reduced beginning with cycle 9 CT 12/04/2020-development of peripheral enhancing hypoattenuating mass in segment 5/8, necrotic porta hepatis lymphadenopathy, similar appearance of previously visualized nonhypermetabolic subcapsular segment 7 lesion Ultrasound-guided biopsy right liver lesion 12/22/2020-adenocarcinoma consistent with a colon primary PET scan 12/30/2020-hypermetabolic hepatic lesion and portal adenopathy-increased in size compared to 12/04/2020 CT, small hypermetabolic left periaortic node Foundation 1-MSI high, tumor mutation burden 31, K-ras wild-type Cycle 1 Pembrolizumab 01/15/2021 Cycle 1 FOLFIRI 02/16/2021 Cycle 2 FOLFIRI 03/02/2021-Emend and Udenyca added 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      Disposition: Sabrina [name] completed 1 cycle of FOLFIRI on 02/16/2021.  The chemotherapy was complicated by delayed nausea and vomiting.  Emend will be added with cycle 2 chemotherapy.  She will call for nausea following this cycle of chemotherapy.  Her pain and performance status appears much improved.  It is unclear whether this is related to FOLFIRI or a late effect from the dose of pembrolizumab.  The PT/INR is at the upper limit of the therapeutic range.  We adjusted the Coumadin dose today.  We will repeat a PT/INR when she returns in 2 weeks.  She has mild neutropenia today.  Ellen Henri will be added to the chemotherapy regimen.  I reviewed potential toxicities associated with Udenyca including the chance of bone pain, rash, and splenic rupture.  She agrees to proceed.  Betsy Coder, MD  03/02/2021  12:42 PM

## 2021-03-02 NOTE — Progress Notes (Signed)
Patient presents for treatment. RN assessment completed along with the following:  Labs/vitals reviewed - Yes, and within treatment parameters.   Weight within 10% of previous measurement - Yes Oncology Treatment Attestation completed for current therapy- Yes, on date 02/04/21 Informed consent completed and reflects current therapy/intent - Yes, on date 03/08/21             Provider progress note reviewed - Today's provider note is not yet available. I reviewed the most recent oncology provider progress note in chart dated 02/20/21. Treatment/Antibody/Supportive plan reviewed - Yes, and there are no adjustments needed for today's treatment. S&H and other orders reviewed - Yes, and there are no additional orders identified. Previous treatment date reviewed - Yes, and the appropriate amount of time has elapsed between treatments. Clinic Hand Off Received from - none   Patient to proceed with treatment.

## 2021-03-02 NOTE — Patient Instructions (Signed)

## 2021-03-04 ENCOUNTER — Other Ambulatory Visit: Payer: Self-pay

## 2021-03-04 ENCOUNTER — Inpatient Hospital Stay: Payer: Medicare Other

## 2021-03-04 VITALS — BP 158/74 | HR 74 | Temp 97.8°F | Resp 18

## 2021-03-04 DIAGNOSIS — C787 Secondary malignant neoplasm of liver and intrahepatic bile duct: Secondary | ICD-10-CM

## 2021-03-04 DIAGNOSIS — C189 Malignant neoplasm of colon, unspecified: Secondary | ICD-10-CM

## 2021-03-04 DIAGNOSIS — Z5111 Encounter for antineoplastic chemotherapy: Secondary | ICD-10-CM | POA: Diagnosis not present

## 2021-03-04 DIAGNOSIS — C50411 Malignant neoplasm of upper-outer quadrant of right female breast: Secondary | ICD-10-CM

## 2021-03-04 MED ORDER — HEPARIN SOD (PORK) LOCK FLUSH 100 UNIT/ML IV SOLN
500.0000 [IU] | Freq: Once | INTRAVENOUS | Status: AC | PRN
  Administered 2021-03-04: 500 [IU]

## 2021-03-04 MED ORDER — SODIUM CHLORIDE 0.9% FLUSH
10.0000 mL | INTRAVENOUS | Status: DC | PRN
  Administered 2021-03-04: 10 mL

## 2021-03-04 MED ORDER — PEGFILGRASTIM-CBQV 6 MG/0.6ML ~~LOC~~ SOSY
6.0000 mg | PREFILLED_SYRINGE | Freq: Once | SUBCUTANEOUS | Status: AC
  Administered 2021-03-04: 6 mg via SUBCUTANEOUS

## 2021-03-04 NOTE — Patient Instructions (Signed)
[facility]  Discharge Instructions: Thank you for choosing [facility] to provide your oncology and hematology care.   If you have a lab appointment with the [facility], please go directly to the [facility] and check in at the registration area.   Wear comfortable clothing and clothing appropriate for easy access to any Portacath or PICC line.   We strive to give you quality time with your provider. You may need to reschedule your appointment if you arrive late (15 or more minutes).  Arriving late affects you and other patients whose appointments are after yours.  Also, if you miss three or more appointments without notifying the office, you may be dismissed from the clinic at the provider's discretion.      For prescription refill requests, have your pharmacy contact our office and allow 72 hours for refills to be completed.    Today you received the following chemotherapy and/or immunotherapy agents 5FU and Udenyca.      To help prevent nausea and vomiting after your treatment, we encourage you to take your nausea medication as directed.  BELOW ARE SYMPTOMS THAT SHOULD BE REPORTED IMMEDIATELY: *FEVER GREATER THAN 100.4 F (38 C) OR HIGHER *CHILLS OR SWEATING *NAUSEA AND VOMITING THAT IS NOT CONTROLLED WITH YOUR NAUSEA MEDICATION *UNUSUAL SHORTNESS OF BREATH *UNUSUAL BRUISING OR BLEEDING *URINARY PROBLEMS (pain or burning when urinating, or frequent urination) *BOWEL PROBLEMS (unusual diarrhea, constipation, pain near the anus) TENDERNESS IN MOUTH AND THROAT WITH OR WITHOUT PRESENCE OF ULCERS (sore throat, sores in mouth, or a toothache) UNUSUAL RASH, SWELLING OR PAIN  UNUSUAL VAGINAL DISCHARGE OR ITCHING   Items with * indicate a potential emergency and should be followed up as soon as possible or go to the Emergency Department if any problems should occur.  Please show the CHEMOTHERAPY ALERT CARD or IMMUNOTHERAPY ALERT CARD at check-in  to the Emergency Department and triage nurse.  Should you have questions after your visit or need to cancel or reschedule your appointment, please contact [facility]  Dept: [phone]  and follow the prompts.  Office hours are 8:00 a.m. to 4:30 p.m. Monday - Friday. Please note that voicemails left after 4:00 p.m. may not be returned until the following business day.  We are closed weekends and major holidays. You have access to a nurse at all times for urgent questions. Please call the main number to the clinic Dept: [phone] and follow the prompts.   For any non-urgent questions, you may also contact your provider using MyChart. We now offer e-Visits for anyone 70 and older to request care online for non-urgent symptoms. For details visit [email or web address].   Also download the MyChart app! Go to the app store, search "MyChart", open the app, select [facility], and log in with your MyChart username and password.  Due to Covid, a mask is required upon entering the hospital/clinic. If you do not have a mask, one will be given to you upon arrival. For doctor visits, patients may have 1 support person aged 70 or older with them. For treatment visits, patients cannot have anyone with them due to current Covid guidelines and our immunocompromised population.

## 2021-03-15 ENCOUNTER — Other Ambulatory Visit: Payer: Self-pay | Admitting: Oncology

## 2021-03-17 ENCOUNTER — Inpatient Hospital Stay: Payer: Medicare Other

## 2021-03-17 ENCOUNTER — Other Ambulatory Visit: Payer: Self-pay

## 2021-03-17 ENCOUNTER — Inpatient Hospital Stay (HOSPITAL_BASED_OUTPATIENT_CLINIC_OR_DEPARTMENT_OTHER): Payer: Medicare Other | Admitting: Oncology

## 2021-03-17 ENCOUNTER — Inpatient Hospital Stay: Payer: Medicare Other | Attending: Nurse Practitioner

## 2021-03-17 VITALS — BP 133/82 | HR 73 | Temp 98.1°F | Resp 18

## 2021-03-17 VITALS — BP 150/81 | HR 84 | Temp 97.8°F | Resp 20 | Ht 70.0 in | Wt 134.0 lb

## 2021-03-17 DIAGNOSIS — D508 Other iron deficiency anemias: Secondary | ICD-10-CM | POA: Diagnosis not present

## 2021-03-17 DIAGNOSIS — G629 Polyneuropathy, unspecified: Secondary | ICD-10-CM | POA: Diagnosis not present

## 2021-03-17 DIAGNOSIS — Z79811 Long term (current) use of aromatase inhibitors: Secondary | ICD-10-CM | POA: Diagnosis not present

## 2021-03-17 DIAGNOSIS — Z5111 Encounter for antineoplastic chemotherapy: Secondary | ICD-10-CM | POA: Insufficient documentation

## 2021-03-17 DIAGNOSIS — Z803 Family history of malignant neoplasm of breast: Secondary | ICD-10-CM | POA: Insufficient documentation

## 2021-03-17 DIAGNOSIS — C189 Malignant neoplasm of colon, unspecified: Secondary | ICD-10-CM

## 2021-03-17 DIAGNOSIS — E039 Hypothyroidism, unspecified: Secondary | ICD-10-CM | POA: Diagnosis not present

## 2021-03-17 DIAGNOSIS — C180 Malignant neoplasm of cecum: Secondary | ICD-10-CM | POA: Insufficient documentation

## 2021-03-17 DIAGNOSIS — Z5189 Encounter for other specified aftercare: Secondary | ICD-10-CM | POA: Diagnosis not present

## 2021-03-17 DIAGNOSIS — Z23 Encounter for immunization: Secondary | ICD-10-CM | POA: Diagnosis not present

## 2021-03-17 DIAGNOSIS — C50411 Malignant neoplasm of upper-outer quadrant of right female breast: Secondary | ICD-10-CM

## 2021-03-17 DIAGNOSIS — F17210 Nicotine dependence, cigarettes, uncomplicated: Secondary | ICD-10-CM | POA: Diagnosis not present

## 2021-03-17 DIAGNOSIS — E119 Type 2 diabetes mellitus without complications: Secondary | ICD-10-CM | POA: Insufficient documentation

## 2021-03-17 DIAGNOSIS — I4891 Unspecified atrial fibrillation: Secondary | ICD-10-CM | POA: Diagnosis not present

## 2021-03-17 DIAGNOSIS — Z170 Estrogen receptor positive status [ER+]: Secondary | ICD-10-CM | POA: Diagnosis not present

## 2021-03-17 DIAGNOSIS — C787 Secondary malignant neoplasm of liver and intrahepatic bile duct: Secondary | ICD-10-CM

## 2021-03-17 DIAGNOSIS — Z7901 Long term (current) use of anticoagulants: Secondary | ICD-10-CM | POA: Diagnosis not present

## 2021-03-17 DIAGNOSIS — C50911 Malignant neoplasm of unspecified site of right female breast: Secondary | ICD-10-CM | POA: Diagnosis not present

## 2021-03-17 DIAGNOSIS — Z79899 Other long term (current) drug therapy: Secondary | ICD-10-CM | POA: Insufficient documentation

## 2021-03-17 LAB — CMP (CANCER CENTER ONLY)
ALT: 11 U/L (ref 0–44)
AST: 22 U/L (ref 15–41)
Albumin: 4.1 g/dL (ref 3.5–5.0)
Alkaline Phosphatase: 184 U/L — ABNORMAL HIGH (ref 38–126)
Anion gap: 7 (ref 5–15)
BUN: 8 mg/dL (ref 8–23)
CO2: 31 mmol/L (ref 22–32)
Calcium: 9.8 mg/dL (ref 8.9–10.3)
Chloride: 102 mmol/L (ref 98–111)
Creatinine: 0.44 mg/dL (ref 0.44–1.00)
GFR, Estimated: >60 mL/min (ref >60)
Glucose, Bld: 150 mg/dL — ABNORMAL HIGH (ref 70–99)
Potassium: 3.9 mmol/L (ref 3.5–5.1)
Sodium: 140 mmol/L (ref 135–145)
Total Bilirubin: 0.4 mg/dL (ref 0.3–1.2)
Total Protein: 6.8 g/dL (ref 6.5–8.1)

## 2021-03-17 LAB — URINALYSIS, COMPLETE (UACMP) WITH MICROSCOPIC
Bilirubin Urine: NEGATIVE
Glucose, UA: NEGATIVE mg/dL
Ketones, ur: NEGATIVE mg/dL
Leukocytes,Ua: NEGATIVE
Nitrite: NEGATIVE
Protein, ur: NEGATIVE mg/dL
Specific Gravity, Urine: 1.007 (ref 1.005–1.030)
pH: 5.5 (ref 5.0–8.0)

## 2021-03-17 LAB — CBC WITH DIFFERENTIAL (CANCER CENTER ONLY)
Abs Immature Granulocytes: 0.21 10*3/uL — ABNORMAL HIGH (ref 0.00–0.07)
Basophils Absolute: 0.1 10*3/uL (ref 0.0–0.1)
Basophils Relative: 1 %
Eosinophils Absolute: 0 10*3/uL (ref 0.0–0.5)
Eosinophils Relative: 0 %
HCT: 40.9 % (ref 36.0–46.0)
Hemoglobin: 12.8 g/dL (ref 12.0–15.0)
Immature Granulocytes: 2 %
Lymphocytes Relative: 7 %
Lymphs Abs: 0.8 10*3/uL (ref 0.7–4.0)
MCH: 28.7 pg (ref 26.0–34.0)
MCHC: 31.3 g/dL (ref 30.0–36.0)
MCV: 91.7 fL (ref 80.0–100.0)
Monocytes Absolute: 0.5 10*3/uL (ref 0.1–1.0)
Monocytes Relative: 5 %
Neutro Abs: 8.6 10*3/uL — ABNORMAL HIGH (ref 1.7–7.7)
Neutrophils Relative %: 85 %
Platelet Count: 167 10*3/uL (ref 150–400)
RBC: 4.46 MIL/uL (ref 3.87–5.11)
RDW: 17.6 % — ABNORMAL HIGH (ref 11.5–15.5)
WBC Count: 10.2 10*3/uL (ref 4.0–10.5)
nRBC: 0 % (ref 0.0–0.2)

## 2021-03-17 LAB — PROTIME-INR
INR: 1.9 — ABNORMAL HIGH (ref 0.8–1.2)
Prothrombin Time: 21.5 seconds — ABNORMAL HIGH (ref 11.4–15.2)

## 2021-03-17 MED ORDER — INFLUENZA VAC A&B SA ADJ QUAD 0.5 ML IM PRSY
0.5000 mL | PREFILLED_SYRINGE | Freq: Once | INTRAMUSCULAR | Status: AC
  Administered 2021-03-17: 0.5 mL via INTRAMUSCULAR
  Filled 2021-03-17: qty 0.5

## 2021-03-17 MED ORDER — SODIUM CHLORIDE 0.9 % IV SOLN
10.0000 mg | Freq: Once | INTRAVENOUS | Status: AC
  Administered 2021-03-17: 10 mg via INTRAVENOUS
  Filled 2021-03-17: qty 1

## 2021-03-17 MED ORDER — SODIUM CHLORIDE 0.9 % IV SOLN
180.0000 mg/m2 | Freq: Once | INTRAVENOUS | Status: AC
  Administered 2021-03-17: 300 mg via INTRAVENOUS
  Filled 2021-03-17: qty 15

## 2021-03-17 MED ORDER — SODIUM CHLORIDE 0.9 % IV SOLN: Freq: Once | INTRAVENOUS | Status: AC

## 2021-03-17 MED ORDER — ATROPINE SULFATE 1 MG/ML IV SOLN
0.5000 mg | Freq: Once | INTRAVENOUS | Status: AC | PRN
  Administered 2021-03-17: 0.5 mg via INTRAVENOUS
  Filled 2021-03-17: qty 1

## 2021-03-17 MED ORDER — SODIUM CHLORIDE 0.9 % IV SOLN
150.0000 mg | Freq: Once | INTRAVENOUS | Status: AC
  Administered 2021-03-17: 150 mg via INTRAVENOUS
  Filled 2021-03-17: qty 5

## 2021-03-17 MED ORDER — PALONOSETRON HCL INJECTION 0.25 MG/5ML
0.2500 mg | Freq: Once | INTRAVENOUS | Status: AC
  Administered 2021-03-17: 0.25 mg via INTRAVENOUS
  Filled 2021-03-17: qty 5

## 2021-03-17 MED ORDER — FLUOROURACIL CHEMO INJECTION 2.5 GM/50ML
400.0000 mg/m2 | Freq: Once | INTRAVENOUS | Status: AC
  Administered 2021-03-17: 650 mg via INTRAVENOUS
  Filled 2021-03-17: qty 13

## 2021-03-17 MED ORDER — SODIUM CHLORIDE 0.9 % IV SOLN
2400.0000 mg/m2 | INTRAVENOUS | Status: DC
  Administered 2021-03-17: 4000 mg via INTRAVENOUS
  Filled 2021-03-17: qty 80

## 2021-03-17 MED ORDER — SODIUM CHLORIDE 0.9 % IV SOLN
400.0000 mg/m2 | Freq: Once | INTRAVENOUS | Status: AC
  Administered 2021-03-17: 668 mg via INTRAVENOUS
  Filled 2021-03-17: qty 33.4

## 2021-03-17 NOTE — Progress Notes (Signed)
[facility] OFFICE PROGRESS NOTE   Diagnosis: Colon cancer  INTERVAL HISTORY:   Sabrina [name] returns as scheduled.  She completed another cycle of FOLFIRI on 03/02/2021.  No nausea/vomiting or mouth sores.  She reports a few days of mild diarrhea.  Abdominal pain remains improved.  She reports swelling at the left foot.  This has improved.  No pain.  She reports dysuria.  She continues to have peripheral neuropathy symptoms  Objective:  Vital signs in last 24 hours:  Blood pressure (!) 150/81, pulse 84, temperature 97.8 F (36.6 C), temperature source Oral, resp. rate 20, height $RemoveBe'5\' 10"'MabrwjOjU$  (1.778 m), weight 134 lb (60.8 kg), SpO2 100 %.    HEENT: No thrush or ulcers Resp: Lungs with scattered rhonchi and wheezes, no respiratory distress Cardio: Regular rate and rhythm GI: Fullness in the right upper abdomen without a discrete liver edge Vascular: No leg edema, the left lower leg is slightly larger than the right side, no erythema or tenderness, no foot swelling    Portacath/PICC-without erythema  Lab Results:  Lab Results  Component Value Date   WBC 10.2 03/17/2021   HGB 12.8 03/17/2021   HCT 40.9 03/17/2021   MCV 91.7 03/17/2021   PLT 167 03/17/2021   NEUTROABS 8.6 (H) 03/17/2021    CMP  Lab Results  Component Value Date   NA 140 03/17/2021   K 3.9 03/17/2021   CL 102 03/17/2021   CO2 31 03/17/2021   GLUCOSE 150 (H) 03/17/2021   BUN 8 03/17/2021   CREATININE 0.44 03/17/2021   CALCIUM 9.8 03/17/2021   PROT 6.8 03/17/2021   ALBUMIN 4.1 03/17/2021   AST 22 03/17/2021   ALT 11 03/17/2021   ALKPHOS 184 (H) 03/17/2021   BILITOT 0.4 03/17/2021   GFRNONAA >60 03/17/2021   GFRAA >60 08/17/2019    Lab Results  Component Value Date   CEA1 1.40 12/16/2020   CEA 1.57 02/16/2021    Lab Results  Component Value Date   INR 1.9 (H) 03/17/2021   LABPROT 21.5 (H) 03/17/2021     Medications: I have reviewed the patient's current  medications.   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, lymphovascular invasion present including large vessel extramural invasion, loss of MLH1 and PMS2, MLH1 hyper methylation present Incomplete colonoscopy secondary to stricturing at the sigmoid colon Virtual colonoscopy 03/13/2020- mass centered at the medial wall of the cecum and terminal ileum, adjacent ileocolic mesenteric adenopathy, nonspecific 3 mm right lower lobe nodule PET 61/95/0932 hypermetabolic cecal/terminal ileum mass with adjacent enlarged and hypermetabolic ileocolic lymph nodes, previously noted segment 7 liver lesion-not hypermetabolic- benign etiology favored, dominant left lower lobe pulmonary nodule not hypermetabolic, other tiny nodules not hypermetabolic and below PET sensitivity Cycle 1 FOLFOX 05/12/2020 Cycle 2 FOLFOX 05/26/2020 Cycle 3 FOLFOX 06/09/2020 Cycle 4 FOLFOX 06/23/2020 Cycle 5 FOLFOX 07/07/2020 Cycle 12 FOLFOX 10/14/2020, oxaliplatin dose reduced beginning with cycle 9 CT 12/04/2020-development of peripheral enhancing hypoattenuating mass in segment 5/8, necrotic porta hepatis lymphadenopathy, similar appearance of previously visualized nonhypermetabolic subcapsular segment 7 lesion Ultrasound-guided biopsy right liver lesion 12/22/2020-adenocarcinoma consistent with a colon primary PET scan 12/30/2020-hypermetabolic hepatic lesion and portal adenopathy-increased in size compared to 12/04/2020 CT, small hypermetabolic left periaortic node Foundation 1-MSI high, tumor mutation burden 31, K-ras wild-type Cycle 1 Pembrolizumab 01/15/2021 Cycle 1 FOLFIRI 02/16/2021 Cycle 2 FOLFIRI 03/02/2021-Emend and Udenyca added Cycle 3 FOLFIRI 03/17/2021 Iron deficiency anemia secondary to #  1 Right breast cancer, stage Ia, T1b, N0, ER positive, PR positive, HER2 positive status post a right lumpectomy and  adjuvant radiation/Taxol/Herceptin, anastrozole starting 01/25/2019 Diabetes Atrial fibrillation-maintained on Coumadin, Coumadin dose adjusted 03/02/2021 Hypothyroidism Ongoing tobacco use Family history of breast cancer-negative genetic testing Oxaliplatin neuropathy Intermittent upper abdominal discomfort-related to liver/portal metastases?-Improved       Disposition: Sabrina [name] appears stable.  She will complete another cycle of FOLFIRI today.  She will be referred for a restaging CT evaluation after 5 cycles of FOLFIRI.  We will check a urinalysis today.  She will receive an influenza vaccine today.  She will call for swelling or pain in the left leg or foot. She will continue Coumadin at current dose.  Sabrina Coder, MD  03/17/2021  10:53 AM

## 2021-03-17 NOTE — Patient Instructions (Addendum)
[facility]   The chemotherapy medication bag should finish at 46 hours, 96 hours, or 7 days. For example, if your pump is scheduled for 46 hours and it was put on at 4:00 p.m., it should finish at 2:00 p.m. the day it is scheduled to come off regardless of your appointment time.     Estimated time to finish at 12:30pm, Thursday March 19, 2021.   If the display on your pump reads "Low Volume" and it is beeping, take the batteries out of the pump and come to the cancer center for it to be taken off.   If the pump alarms go off prior to the pump reading "Low Volume" then call [phone] and someone can assist you.  If the plunger comes out and the chemotherapy medication is leaking out, please use your home chemo spill kit to clean up the spill. Do NOT use paper towels or other household products.  If you have problems or questions regarding your pump, please call either [phone] (24 hours a day) or the cancer center Monday-Friday 8:00 a.m.- 4:30 p.m. at the clinic number and we will assist you. If you are unable to get assistance, then go to the nearest Emergency Department and ask the staff to contact the IV team for assistance.  Discharge Instructions: Thank you for choosing [facility] to provide your oncology and hematology care.   If you have a lab appointment with the [facility], please go directly to the [facility] and check in at the registration area.   Wear comfortable clothing and clothing appropriate for easy access to any Portacath or PICC line.   We strive to give you quality time with your provider. You may need to reschedule your appointment if you arrive late (15 or more minutes).  Arriving late affects you and other patients whose appointments are after yours.  Also, if you miss three or more appointments without notifying the office, you may be dismissed from the clinic at the provider's discretion.      For  prescription refill requests, have your pharmacy contact our office and allow 72 hours for refills to be completed.    Today you received the following chemotherapy and/or immunotherapy agents Irinotecan, leucovorin, Fluorouracil      To help prevent nausea and vomiting after your treatment, we encourage you to take your nausea medication as directed.  BELOW ARE SYMPTOMS THAT SHOULD BE REPORTED IMMEDIATELY: *FEVER GREATER THAN 100.4 F (38 C) OR HIGHER *CHILLS OR SWEATING *NAUSEA AND VOMITING THAT IS NOT CONTROLLED WITH YOUR NAUSEA MEDICATION *UNUSUAL SHORTNESS OF BREATH *UNUSUAL BRUISING OR BLEEDING *URINARY PROBLEMS (pain or burning when urinating, or frequent urination) *BOWEL PROBLEMS (unusual diarrhea, constipation, pain near the anus) TENDERNESS IN MOUTH AND THROAT WITH OR WITHOUT PRESENCE OF ULCERS (sore throat, sores in mouth, or a toothache) UNUSUAL RASH, SWELLING OR PAIN  UNUSUAL VAGINAL DISCHARGE OR ITCHING   Items with * indicate a potential emergency and should be followed up as soon as possible or go to the Emergency Department if any problems should occur.  Please show the CHEMOTHERAPY ALERT CARD or IMMUNOTHERAPY ALERT CARD at check-in to the Emergency Department and triage nurse.  Should you have questions after your visit or need to cancel or reschedule your appointment, please contact [facility]  Dept: [phone]  and follow the prompts.  Office hours are 8:00 a.m. to 4:30 p.m. Monday - Friday. Please note that voicemails  left after 4:00 p.m. may not be returned until the following business day.  We are closed weekends and major holidays. You have access to a nurse at all times for urgent questions. Please call the main number to the clinic Dept: [phone] and follow the prompts.   For any non-urgent questions, you may also contact your provider using MyChart. We now offer e-Visits for anyone 85 and older to request care online for  non-urgent symptoms. For details visit [email or web address].   Also download the MyChart app! Go to the app store, search "MyChart", open the app, select [facility], and log in with your MyChart username and password.  Due to Covid, a mask is required upon entering the hospital/clinic. If you do not have a mask, one will be given to you upon arrival. For doctor visits, patients may have 1 support person aged 60 or older with them. For treatment visits, patients cannot have anyone with them due to current Covid guidelines and our immunocompromised population.   Irinotecan injection What is this medication? IRINOTECAN (ir in oh TEE kan ) is a chemotherapy drug. It is used to treat colon and rectal cancer. This medicine may be used for other purposes; ask your health care provider or pharmacist if you have questions. COMMON BRAND NAME(S): Camptosar What should I tell my care team before I take this medication? They need to know if you have any of these conditions: dehydration diarrhea infection (especially a virus infection such as chickenpox, cold sores, or herpes) liver disease low blood counts, like low white cell, platelet, or red cell counts low levels of calcium, magnesium, or potassium in the blood recent or ongoing radiation therapy an unusual or allergic reaction to irinotecan, other medicines, foods, dyes, or preservatives pregnant or trying to get pregnant breast-feeding How should I use this medication? This drug is given as an infusion into a vein. It is administered in a hospital or clinic by a specially trained health care professional. Talk to your pediatrician regarding the use of this medicine in children. Special care may be needed. Overdosage: If you think you have taken too much of this medicine contact a poison control center or emergency room at once. NOTE: This medicine is only for you. Do not share this medicine with others. What if I miss a dose? It is important  not to miss your dose. Call your doctor or health care professional if you are unable to keep an appointment. What may interact with this medication? Do not take this medicine with any of the following medications: cobicistat itraconazole This medicine may interact with the following medications: antiviral medicines for HIV or AIDS certain antibiotics like rifampin or rifabutin certain medicines for fungal infections like ketoconazole, posaconazole, and voriconazole certain medicines for seizures like carbamazepine, phenobarbital, phenotoin clarithromycin gemfibrozil nefazodone [facility]. John's Wort This list may not describe all possible interactions. Give your health care provider a list of all the medicines, herbs, non-prescription drugs, or dietary supplements you use. Also tell them if you smoke, drink alcohol, or use illegal drugs. Some items may interact with your medicine. What should I watch for while using this medication? Your condition will be monitored carefully while you are receiving this medicine. You will need important blood work done while you are taking this medicine. This drug may make you feel generally unwell. This is not uncommon, as chemotherapy can affect healthy cells as well as cancer cells. Report any side effects. Continue your course of treatment even though you  feel ill unless your doctor tells you to stop. In some cases, you may be given additional medicines to help with side effects. Follow all directions for their use. You may get drowsy or dizzy. Do not drive, use machinery, or do anything that needs mental alertness until you know how this medicine affects you. Do not stand or sit up quickly, especially if you are an older patient. This reduces the risk of dizzy or fainting spells. Call your health care professional for advice if you get a fever, chills, or sore throat, or other symptoms of a cold or flu. Do not treat yourself. This medicine decreases your body's  ability to fight infections. Try to avoid being around people who are sick. Avoid taking products that contain aspirin, acetaminophen, ibuprofen, naproxen, or ketoprofen unless instructed by your doctor. These medicines may hide a fever. This medicine may increase your risk to bruise or bleed. Call your doctor or health care professional if you notice any unusual bleeding. Be careful brushing and flossing your teeth or using a toothpick because you may get an infection or bleed more easily. If you have any dental work done, tell your dentist you are receiving this medicine. Do not become pregnant while taking this medicine or for 6 months after stopping it. Women should inform their health care professional if they wish to become pregnant or think they might be pregnant. Men should not father a child while taking this medicine and for 3 months after stopping it. There is potential for serious side effects to an unborn child. Talk to your health care professional for more information. Do not breast-feed an infant while taking this medicine or for 7 days after stopping it. This medicine has caused ovarian failure in some women. This medicine may make it more difficult to get pregnant. Talk to your health care professional if you are concerned about your fertility. This medicine has caused decreased sperm counts in some men. This may make it more difficult to father a child. Talk to your health care professional if you are concerned about your fertility. What side effects may I notice from receiving this medication? Side effects that you should report to your doctor or health care professional as soon as possible: allergic reactions like skin rash, itching or hives, swelling of the face, lips, or tongue chest pain diarrhea flushing, runny nose, sweating during infusion low blood counts - this medicine may decrease the number of white blood cells, red blood cells and platelets. You may be at increased risk  for infections and bleeding. nausea, vomiting pain, swelling, warmth in the leg signs of decreased platelets or bleeding - bruising, pinpoint red spots on the skin, black, tarry stools, blood in the urine signs of infection - fever or chills, cough, sore throat, pain or difficulty passing urine signs of decreased red blood cells - unusually weak or tired, fainting spells, lightheadedness Side effects that usually do not require medical attention (report to your doctor or health care professional if they continue or are bothersome): constipation hair loss headache loss of appetite mouth sores stomach pain This list may not describe all possible side effects. Call your doctor for medical advice about side effects. You may report side effects to FDA at 1-800-FDA-1088. Where should I keep my medication? This drug is given in a hospital or clinic and will not be stored at home. NOTE: This sheet is a summary. It may not cover all possible information. If you have questions about this medicine,  talk to your doctor, pharmacist, or health care provider.  2022 Elsevier/Gold Standard (2020-12-16 00:00:00)  Leucovorin injection What is this medication? LEUCOVORIN (loo koe VOR in) is used to prevent or treat the harmful effects of some medicines. This medicine is used to treat anemia caused by a low amount of folic acid in the body. It is also used with 5-fluorouracil (5-FU) to treat colon cancer. This medicine may be used for other purposes; ask your health care provider or pharmacist if you have questions. What should I tell my care team before I take this medication? They need to know if you have any of these conditions: anemia from low levels of vitamin B-12 in the blood an unusual or allergic reaction to leucovorin, folic acid, other medicines, foods, dyes, or preservatives pregnant or trying to get pregnant breast-feeding How should I use this medication? This medicine is for injection into a  muscle or into a vein. It is given by a health care professional in a hospital or clinic setting. Talk to your pediatrician regarding the use of this medicine in children. Special care may be needed. Overdosage: If you think you have taken too much of this medicine contact a poison control center or emergency room at once. NOTE: This medicine is only for you. Do not share this medicine with others. What if I miss a dose? This does not apply. What may interact with this medication? capecitabine fluorouracil phenobarbital phenytoin primidone trimethoprim-sulfamethoxazole This list may not describe all possible interactions. Give your health care provider a list of all the medicines, herbs, non-prescription drugs, or dietary supplements you use. Also tell them if you smoke, drink alcohol, or use illegal drugs. Some items may interact with your medicine. What should I watch for while using this medication? Your condition will be monitored carefully while you are receiving this medicine. This medicine may increase the side effects of 5-fluorouracil, 5-FU. Tell your doctor or health care professional if you have diarrhea or mouth sores that do not get better or that get worse. What side effects may I notice from receiving this medication? Side effects that you should report to your doctor or health care professional as soon as possible: allergic reactions like skin rash, itching or hives, swelling of the face, lips, or tongue breathing problems fever, infection mouth sores unusual bleeding or bruising unusually weak or tired Side effects that usually do not require medical attention (report to your doctor or health care professional if they continue or are bothersome): constipation or diarrhea loss of appetite nausea, vomiting This list may not describe all possible side effects. Call your doctor for medical advice about side effects. You may report side effects to FDA at  1-800-FDA-1088. Where should I keep my medication? This drug is given in a hospital or clinic and will not be stored at home. NOTE: This sheet is a summary. It may not cover all possible information. If you have questions about this medicine, talk to your doctor, pharmacist, or health care provider.  2022 Elsevier/Gold Standard (2007-10-05 00:00:00)  Fluorouracil, 5-FU injection What is this medication? FLUOROURACIL, 5-FU (flure oh YOOR a sil) is a chemotherapy drug. It slows the growth of cancer cells. This medicine is used to treat many types of cancer like breast cancer, colon or rectal cancer, pancreatic cancer, and stomach cancer. This medicine may be used for other purposes; ask your health care provider or pharmacist if you have questions. COMMON BRAND NAME(S): Adrucil What should I tell my care team  before I take this medication? They need to know if you have any of these conditions: blood disorders dihydropyrimidine dehydrogenase (DPD) deficiency infection (especially a virus infection such as chickenpox, cold sores, or herpes) kidney disease liver disease malnourished, poor nutrition recent or ongoing radiation therapy an unusual or allergic reaction to fluorouracil, other chemotherapy, other medicines, foods, dyes, or preservatives pregnant or trying to get pregnant breast-feeding How should I use this medication? This drug is given as an infusion or injection into a vein. It is administered in a hospital or clinic by a specially trained health care professional. Talk to your pediatrician regarding the use of this medicine in children. Special care may be needed. Overdosage: If you think you have taken too much of this medicine contact a poison control center or emergency room at once. NOTE: This medicine is only for you. Do not share this medicine with others. What if I miss a dose? It is important not to miss your dose. Call your doctor or health care professional if you  are unable to keep an appointment. What may interact with this medication? Do not take this medicine with any of the following medications: live virus vaccines This medicine may also interact with the following medications: medicines that treat or prevent blood clots like warfarin, enoxaparin, and dalteparin This list may not describe all possible interactions. Give your health care provider a list of all the medicines, herbs, non-prescription drugs, or dietary supplements you use. Also tell them if you smoke, drink alcohol, or use illegal drugs. Some items may interact with your medicine. What should I watch for while using this medication? Visit your doctor for checks on your progress. This drug may make you feel generally unwell. This is not uncommon, as chemotherapy can affect healthy cells as well as cancer cells. Report any side effects. Continue your course of treatment even though you feel ill unless your doctor tells you to stop. In some cases, you may be given additional medicines to help with side effects. Follow all directions for their use. Call your doctor or health care professional for advice if you get a fever, chills or sore throat, or other symptoms of a cold or flu. Do not treat yourself. This drug decreases your body's ability to fight infections. Try to avoid being around people who are sick. This medicine may increase your risk to bruise or bleed. Call your doctor or health care professional if you notice any unusual bleeding. Be careful brushing and flossing your teeth or using a toothpick because you may get an infection or bleed more easily. If you have any dental work done, tell your dentist you are receiving this medicine. Avoid taking products that contain aspirin, acetaminophen, ibuprofen, naproxen, or ketoprofen unless instructed by your doctor. These medicines may hide a fever. Do not become pregnant while taking this medicine. Women should inform their doctor if they  wish to become pregnant or think they might be pregnant. There is a potential for serious side effects to an unborn child. Talk to your health care professional or pharmacist for more information. Do not breast-feed an infant while taking this medicine. Men should inform their doctor if they wish to father a child. This medicine may lower sperm counts. Do not treat diarrhea with over the counter products. Contact your doctor if you have diarrhea that lasts more than 2 days or if it is severe and watery. This medicine can make you more sensitive to the sun. Keep out of the  sun. If you cannot avoid being in the sun, wear protective clothing and use sunscreen. Do not use sun lamps or tanning beds/booths. What side effects may I notice from receiving this medication? Side effects that you should report to your doctor or health care professional as soon as possible: allergic reactions like skin rash, itching or hives, swelling of the face, lips, or tongue low blood counts - this medicine may decrease the number of white blood cells, red blood cells and platelets. You may be at increased risk for infections and bleeding. signs of infection - fever or chills, cough, sore throat, pain or difficulty passing urine signs of decreased platelets or bleeding - bruising, pinpoint red spots on the skin, black, tarry stools, blood in the urine signs of decreased red blood cells - unusually weak or tired, fainting spells, lightheadedness breathing problems changes in vision chest pain mouth sores nausea and vomiting pain, swelling, redness at site where injected pain, tingling, numbness in the hands or feet redness, swelling, or sores on hands or feet stomach pain unusual bleeding Side effects that usually do not require medical attention (report to your doctor or health care professional if they continue or are bothersome): changes in finger or toe nails diarrhea dry or itchy skin hair loss headache loss  of appetite sensitivity of eyes to the light stomach upset unusually teary eyes This list may not describe all possible side effects. Call your doctor for medical advice about side effects. You may report side effects to FDA at 1-800-FDA-1088. Where should I keep my medication? This drug is given in a hospital or clinic and will not be stored at home. NOTE: This sheet is a summary. It may not cover all possible information. If you have questions about this medicine, talk to your doctor, pharmacist, or health care provider.  2022 Elsevier/Gold Standard (2020-12-16 00:00:00)

## 2021-03-17 NOTE — Progress Notes (Signed)
Patient presents for treatment. RN assessment completed along with the following:  Labs/vitals reviewed - Yes, and within treatment parameters.   Weight within 10% of previous measurement - Yes Oncology Treatment Attestation completed for current therapy- Yes, on date 02/04/21 Informed consent completed and reflects current therapy/intent - Yes, on date 02/16/21             Provider progress note reviewed - Yes, today's provider note was reviewed. Treatment/Antibody/Supportive plan reviewed - Yes, and there are no adjustments needed for today's treatment. S&H and other orders reviewed - Yes, and Patient to receive influenza vaccine today.  Previous treatment date reviewed - Yes, and the appropriate amount of time has elapsed between treatments.   Patient to proceed with treatment.

## 2021-03-17 NOTE — Progress Notes (Signed)
Patient seen by Dr. Sherrill today ? ?Vitals are within treatment parameters. ? ?Labs reviewed by Dr. Sherrill and are within treatment parameters. ? ?Per physician team, patient is ready for treatment and there are NO modifications to the treatment plan.  ?

## 2021-03-18 ENCOUNTER — Telehealth: Payer: Self-pay

## 2021-03-18 NOTE — Telephone Encounter (Signed)
Pt verbalized understanding.

## 2021-03-18 NOTE — Telephone Encounter (Signed)
-----   Message from Ladell Pier, MD sent at 03/17/2021  4:03 PM EST ----- Please call patient, urinalysis is negative for evidence of infection

## 2021-03-19 ENCOUNTER — Inpatient Hospital Stay: Payer: Medicare Other

## 2021-03-19 ENCOUNTER — Other Ambulatory Visit: Payer: Self-pay

## 2021-03-19 VITALS — BP 143/78 | HR 83 | Temp 98.3°F | Resp 18

## 2021-03-19 DIAGNOSIS — C50411 Malignant neoplasm of upper-outer quadrant of right female breast: Secondary | ICD-10-CM

## 2021-03-19 DIAGNOSIS — C189 Malignant neoplasm of colon, unspecified: Secondary | ICD-10-CM

## 2021-03-19 DIAGNOSIS — Z5111 Encounter for antineoplastic chemotherapy: Secondary | ICD-10-CM | POA: Diagnosis not present

## 2021-03-19 MED ORDER — HEPARIN SOD (PORK) LOCK FLUSH 100 UNIT/ML IV SOLN
500.0000 [IU] | Freq: Once | INTRAVENOUS | Status: AC | PRN
  Administered 2021-03-19: 500 [IU]

## 2021-03-19 MED ORDER — SODIUM CHLORIDE 0.9% FLUSH
10.0000 mL | INTRAVENOUS | Status: DC | PRN
  Administered 2021-03-19: 10 mL

## 2021-03-19 MED ORDER — PEGFILGRASTIM-CBQV 6 MG/0.6ML ~~LOC~~ SOSY
6.0000 mg | PREFILLED_SYRINGE | Freq: Once | SUBCUTANEOUS | Status: AC
  Administered 2021-03-19: 6 mg via SUBCUTANEOUS

## 2021-03-19 NOTE — Patient Instructions (Signed)

## 2021-03-29 ENCOUNTER — Other Ambulatory Visit: Payer: Self-pay | Admitting: Oncology

## 2021-03-31 ENCOUNTER — Inpatient Hospital Stay: Payer: Medicare Other

## 2021-03-31 ENCOUNTER — Telehealth: Payer: Self-pay

## 2021-03-31 ENCOUNTER — Encounter: Payer: Self-pay | Admitting: Nurse Practitioner

## 2021-03-31 ENCOUNTER — Inpatient Hospital Stay (HOSPITAL_BASED_OUTPATIENT_CLINIC_OR_DEPARTMENT_OTHER): Payer: Medicare Other | Admitting: Nurse Practitioner

## 2021-03-31 ENCOUNTER — Other Ambulatory Visit: Payer: Self-pay

## 2021-03-31 VITALS — BP 152/77 | HR 72

## 2021-03-31 VITALS — BP 136/66 | HR 77 | Temp 98.1°F | Resp 18 | Ht 70.0 in | Wt 136.0 lb

## 2021-03-31 DIAGNOSIS — Z5111 Encounter for antineoplastic chemotherapy: Secondary | ICD-10-CM | POA: Diagnosis not present

## 2021-03-31 DIAGNOSIS — C787 Secondary malignant neoplasm of liver and intrahepatic bile duct: Secondary | ICD-10-CM

## 2021-03-31 DIAGNOSIS — C189 Malignant neoplasm of colon, unspecified: Secondary | ICD-10-CM | POA: Diagnosis not present

## 2021-03-31 DIAGNOSIS — Z170 Estrogen receptor positive status [ER+]: Secondary | ICD-10-CM

## 2021-03-31 DIAGNOSIS — I480 Paroxysmal atrial fibrillation: Secondary | ICD-10-CM | POA: Diagnosis not present

## 2021-03-31 LAB — CBC WITH DIFFERENTIAL (CANCER CENTER ONLY)
Abs Immature Granulocytes: 0.06 10*3/uL (ref 0.00–0.07)
Basophils Absolute: 0.1 10*3/uL (ref 0.0–0.1)
Basophils Relative: 1 %
Eosinophils Absolute: 0 10*3/uL (ref 0.0–0.5)
Eosinophils Relative: 0 %
HCT: 35.3 % — ABNORMAL LOW (ref 36.0–46.0)
Hemoglobin: 11 g/dL — ABNORMAL LOW (ref 12.0–15.0)
Immature Granulocytes: 1 %
Lymphocytes Relative: 9 %
Lymphs Abs: 0.9 10*3/uL (ref 0.7–4.0)
MCH: 28.9 pg (ref 26.0–34.0)
MCHC: 31.2 g/dL (ref 30.0–36.0)
MCV: 92.9 fL (ref 80.0–100.0)
Monocytes Absolute: 0.8 10*3/uL (ref 0.1–1.0)
Monocytes Relative: 8 %
Neutro Abs: 7.7 10*3/uL (ref 1.7–7.7)
Neutrophils Relative %: 81 %
Platelet Count: 150 10*3/uL (ref 150–400)
RBC: 3.8 MIL/uL — ABNORMAL LOW (ref 3.87–5.11)
RDW: 17.8 % — ABNORMAL HIGH (ref 11.5–15.5)
WBC Count: 9.5 10*3/uL (ref 4.0–10.5)
nRBC: 0 % (ref 0.0–0.2)

## 2021-03-31 LAB — PROTIME-INR
INR: 2.1 — ABNORMAL HIGH (ref 0.8–1.2)
Prothrombin Time: 23.4 seconds — ABNORMAL HIGH (ref 11.4–15.2)

## 2021-03-31 LAB — CMP (CANCER CENTER ONLY)
ALT: 11 U/L (ref 0–44)
AST: 21 U/L (ref 15–41)
Albumin: 3.7 g/dL (ref 3.5–5.0)
Alkaline Phosphatase: 178 U/L — ABNORMAL HIGH (ref 38–126)
Anion gap: 7 (ref 5–15)
BUN: 11 mg/dL (ref 8–23)
CO2: 31 mmol/L (ref 22–32)
Calcium: 8.8 mg/dL — ABNORMAL LOW (ref 8.9–10.3)
Chloride: 104 mmol/L (ref 98–111)
Creatinine: 0.45 mg/dL (ref 0.44–1.00)
GFR, Estimated: >60 mL/min (ref >60)
Glucose, Bld: 171 mg/dL — ABNORMAL HIGH (ref 70–99)
Potassium: 3.3 mmol/L — ABNORMAL LOW (ref 3.5–5.1)
Sodium: 142 mmol/L (ref 135–145)
Total Bilirubin: 0.3 mg/dL (ref 0.3–1.2)
Total Protein: 6.1 g/dL — ABNORMAL LOW (ref 6.5–8.1)

## 2021-03-31 MED ORDER — SODIUM CHLORIDE 0.9 % IV SOLN
150.0000 mg | Freq: Once | INTRAVENOUS | Status: AC
  Administered 2021-03-31: 11:00:00 150 mg via INTRAVENOUS
  Filled 2021-03-31: qty 5

## 2021-03-31 MED ORDER — SODIUM CHLORIDE 0.9 % IV SOLN: Freq: Once | INTRAVENOUS | Status: AC

## 2021-03-31 MED ORDER — ATROPINE SULFATE 1 MG/ML IV SOLN
0.5000 mg | Freq: Once | INTRAVENOUS | Status: AC | PRN
  Administered 2021-03-31: 12:00:00 0.5 mg via INTRAVENOUS
  Filled 2021-03-31: qty 1

## 2021-03-31 MED ORDER — PALONOSETRON HCL INJECTION 0.25 MG/5ML
0.2500 mg | Freq: Once | INTRAVENOUS | Status: AC
  Administered 2021-03-31: 11:00:00 0.25 mg via INTRAVENOUS
  Filled 2021-03-31: qty 5

## 2021-03-31 MED ORDER — SODIUM CHLORIDE 0.9 % IV SOLN
400.0000 mg/m2 | Freq: Once | INTRAVENOUS | Status: AC
  Administered 2021-03-31: 12:00:00 668 mg via INTRAVENOUS
  Filled 2021-03-31: qty 33.4

## 2021-03-31 MED ORDER — SODIUM CHLORIDE 0.9 % IV SOLN
2400.0000 mg/m2 | INTRAVENOUS | Status: DC
  Administered 2021-03-31: 14:00:00 4000 mg via INTRAVENOUS
  Filled 2021-03-31: qty 80

## 2021-03-31 MED ORDER — SODIUM CHLORIDE 0.9 % IV SOLN
180.0000 mg/m2 | Freq: Once | INTRAVENOUS | Status: AC
  Administered 2021-03-31: 12:00:00 300 mg via INTRAVENOUS
  Filled 2021-03-31: qty 15

## 2021-03-31 MED ORDER — FLUOROURACIL CHEMO INJECTION 2.5 GM/50ML
400.0000 mg/m2 | Freq: Once | INTRAVENOUS | Status: AC
  Administered 2021-03-31: 13:00:00 650 mg via INTRAVENOUS
  Filled 2021-03-31: qty 13

## 2021-03-31 MED ORDER — SODIUM CHLORIDE 0.9 % IV SOLN
10.0000 mg | Freq: Once | INTRAVENOUS | Status: AC
  Administered 2021-03-31: 11:00:00 10 mg via INTRAVENOUS
  Filled 2021-03-31: qty 1

## 2021-03-31 NOTE — Progress Notes (Signed)
[facility] OFFICE PROGRESS NOTE   Diagnosis: Colon cancer  INTERVAL HISTORY:   Ms. Sayer returns as scheduled.  She completed cycle 3 FOLFIRI 03/17/2021.  She denies nausea/vomiting.  No mouth sores.  She has periodic diarrhea.  No abdominal pain.  Neuropathy symptoms are unchanged.  Objective:  Vital signs in last 24 hours:  Blood pressure 136/66, pulse 77, temperature 98.1 F (36.7 C), temperature source Oral, resp. rate 18, height $RemoveBe'5\' 10"'[id number]$  (1.778 m), weight 136 lb (61.7 kg), SpO2 100 %.    HEENT: No thrush or ulcers. Resp: Lungs with scattered rhonchi and wheezes.  No respiratory distress. Cardio: Regular rate and rhythm. GI: No hepatomegaly. Vascular: No leg edema.  Left lower leg is slightly larger than the right lower leg. Port-A-Cath without erythema.  Lab Results:  Lab Results  Component Value Date   WBC 9.5 03/31/2021   HGB 11.0 (L) 03/31/2021   HCT 35.3 (L) 03/31/2021   MCV 92.9 03/31/2021   PLT 150 03/31/2021   NEUTROABS 7.7 03/31/2021    Imaging:  No results found.  Medications: I have reviewed the patient's current medications.  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, lymphovascular invasion present including large vessel extramural invasion, loss of MLH1 and PMS2, MLH1 hyper methylation present Incomplete colonoscopy secondary to stricturing at the sigmoid colon Virtual colonoscopy 03/13/2020- mass centered at the medial wall of the cecum and terminal ileum, adjacent ileocolic mesenteric adenopathy, nonspecific 3 mm right lower lobe nodule PET 26/71/2458 hypermetabolic cecal/terminal ileum mass with adjacent enlarged and hypermetabolic ileocolic lymph nodes, previously noted segment 7 liver lesion-not hypermetabolic- benign etiology favored, dominant left lower lobe pulmonary nodule not hypermetabolic, other tiny  nodules not hypermetabolic and below PET sensitivity Cycle 1 FOLFOX 05/12/2020 Cycle 2 FOLFOX 05/26/2020 Cycle 3 FOLFOX 06/09/2020 Cycle 4 FOLFOX 06/23/2020 Cycle 5 FOLFOX 07/07/2020 Cycle 12 FOLFOX 10/14/2020, oxaliplatin dose reduced beginning with cycle 9 CT 12/04/2020-development of peripheral enhancing hypoattenuating mass in segment 5/8, necrotic porta hepatis lymphadenopathy, similar appearance of previously visualized nonhypermetabolic subcapsular segment 7 lesion Ultrasound-guided biopsy right liver lesion 12/22/2020-adenocarcinoma consistent with a colon primary PET scan 12/30/2020-hypermetabolic hepatic lesion and portal adenopathy-increased in size compared to 12/04/2020 CT, small hypermetabolic left periaortic node Foundation 1-MSI high, tumor mutation burden 31, K-ras wild-type Cycle 1 Pembrolizumab 01/15/2021 Cycle 1 FOLFIRI 02/16/2021 Cycle 2 FOLFIRI 03/02/2021-Emend and Udenyca added Cycle 3 FOLFIRI 03/17/2021 Cycle 4 FOLFIRI 03/31/2021 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 Croslin appears well.  She has completed 3 cycles of FOLFIRI.  Clinical status continues to be improved.  Plan to proceed with cycle 4 today as scheduled.  Restaging CTs after cycle 5.  CBC and chemistry panel reviewed.  Labs adequate to proceed as above.  She has mild hypokalemia.  She will begin K-Dur 20 mEq daily.  She will return for lab, follow-up, cycle follow-up FOLFIRI in 2 weeks.  She will contact the office in the  interim with any problems.   Ned Card ANP/GNP-BC   03/31/2021  10:05 AM

## 2021-03-31 NOTE — Telephone Encounter (Signed)
Went to infusion and spoke to the patient, inform her that PT/INR in goal range, continue dose of coumadin Patient voiced understanding

## 2021-03-31 NOTE — Telephone Encounter (Signed)
-----   Message from Owens Shark, NP sent at 03/31/2021 12:22 PM EST ----- Please let her know PT/INR in goal range, continue same dose of coumadin

## 2021-03-31 NOTE — Progress Notes (Signed)
Patient presents for treatment. RN assessment completed along with the following:  Labs/vitals reviewed - Yes, and within treatment parameters.   Weight within 10% of previous measurement - Yes Oncology Treatment Attestation completed for current therapy- Yes, on date 02/04/21 Informed consent completed and reflects current therapy/intent - Yes, on date 02/16/21             Provider progress note reviewed - Yes, today's provider note was reviewed. Treatment/Antibody/Supportive plan reviewed - Yes, and there are no adjustments needed for today's treatment. S&H and other orders reviewed - Yes, and there are no additional orders identified. Previous treatment date reviewed - Yes, and the appropriate amount of time has elapsed between treatments. Clinic Hand Off Received from - Jerene Canny, LPN  Patient to proceed with treatment.

## 2021-03-31 NOTE — Patient Instructions (Addendum)
[place]  The chemotherapy medication bag should finish at 46 hours, 96 hours, or 7 days. For example, if your pump is scheduled for 46 hours and it was put on at 4:00 p.m., it should finish at 2:00 p.m. the day it is scheduled to come off regardless of your appointment time.     Estimated time to finish at  11:45 Thursday, April 02, 2021.   If the display on your pump reads "Low Volume" and it is beeping, take the batteries out of the pump and come to the cancer center for it to be taken off.   If the pump alarms go off prior to the pump reading "Low Volume" then call [phone] and someone can assist you.  If the plunger comes out and the chemotherapy medication is leaking out, please use your home chemo spill kit to clean up the spill. Do NOT use paper towels or other household products.  If you have problems or questions regarding your pump, please call either [phone] (24 hours a day) or the cancer center Monday-Friday 8:00 a.m.- 4:30 p.m. at the clinic number and we will assist you. If you are unable to get assistance, then go to the nearest Emergency Department and ask the staff to contact the IV team for assistance.   Discharge Instructions: Thank you for choosing [facility] to provide your oncology and hematology care.   If you have a lab appointment with the [facility], please go directly to the [facility] and check in at the registration area.   Wear comfortable clothing and clothing appropriate for easy access to any Portacath or PICC line.   We strive to give you quality time with your provider. You may need to reschedule your appointment if you arrive late (15 or more minutes).  Arriving late affects you and other patients whose appointments are after yours.  Also, if you miss three or more appointments without notifying the office, you may be dismissed from the clinic at the providers discretion.      For  prescription refill requests, have your pharmacy contact our office and allow 72 hours for refills to be completed.    Today you received the following chemotherapy and/or immunotherapy agents Irinotecan, leucovorin, fluorouracil      To help prevent nausea and vomiting after your treatment, we encourage you to take your nausea medication as directed.  BELOW ARE SYMPTOMS THAT SHOULD BE REPORTED IMMEDIATELY: *FEVER GREATER THAN 100.4 F (38 C) OR HIGHER *CHILLS OR SWEATING *NAUSEA AND VOMITING THAT IS NOT CONTROLLED WITH YOUR NAUSEA MEDICATION *UNUSUAL SHORTNESS OF BREATH *UNUSUAL BRUISING OR BLEEDING *URINARY PROBLEMS (pain or burning when urinating, or frequent urination) *BOWEL PROBLEMS (unusual diarrhea, constipation, pain near the anus) TENDERNESS IN MOUTH AND THROAT WITH OR WITHOUT PRESENCE OF ULCERS (sore throat, sores in mouth, or a toothache) UNUSUAL RASH, SWELLING OR PAIN  UNUSUAL VAGINAL DISCHARGE OR ITCHING   Items with * indicate a potential emergency and should be followed up as soon as possible or go to the Emergency Department if any problems should occur.  Please show the CHEMOTHERAPY ALERT CARD or IMMUNOTHERAPY ALERT CARD at check-in to the Emergency Department and triage nurse.  Should you have questions after your visit or need to cancel or reschedule your appointment, please contact [facility]  Dept: [phone]  and follow the prompts.  Office hours are 8:00 a.m. to 4:30 p.m. Monday - Friday. Please note that  voicemails left after 4:00 p.m. may not be returned until the following business day.  We are closed weekends and major holidays. You have access to a nurse at all times for urgent questions. Please call the main number to the clinic Dept: [phone] and follow the prompts.   For any non-urgent questions, you may also contact your provider using MyChart. We now offer e-Visits for anyone 9 and older to request care online for  non-urgent symptoms. For details visit [email or web address].   Also download the MyChart app! Go to the app store, search "MyChart", open the app, select [facility], and log in with your MyChart username and password.  Due to Covid, a mask is required upon entering the hospital/clinic. If you do not have a mask, one will be given to you upon arrival. For doctor visits, patients may have 1 support person aged 77 or older with them. For treatment visits, patients cannot have anyone with them due to cur Irinotecan injection What is this medication? IRINOTECAN (ir in oh TEE kan ) is a chemotherapy drug. It is used to treat colon and rectal cancer. This medicine may be used for other purposes; ask your health care provider or pharmacist if you have questions. COMMON BRAND NAME(S): Camptosar What should I tell my care team before I take this medication? They need to know if you have any of these conditions: dehydration diarrhea infection (especially a virus infection such as chickenpox, cold sores, or herpes) liver disease low blood counts, like low white cell, platelet, or red cell counts low levels of calcium, magnesium, or potassium in the blood recent or ongoing radiation therapy an unusual or allergic reaction to irinotecan, other medicines, foods, dyes, or preservatives pregnant or trying to get pregnant breast-feeding How should I use this medication? This drug is given as an infusion into a vein. It is administered in a hospital or clinic by a specially trained health care professional. Talk to your pediatrician regarding the use of this medicine in children. Special care may be needed. Overdosage: If you think you have taken too much of this medicine contact a poison control center or emergency room at once. NOTE: This medicine is only for you. Do not share this medicine with others. What if I miss a dose? It is important not to miss your dose. Call your doctor or health care  professional if you are unable to keep an appointment. What may interact with this medication? Do not take this medicine with any of the following medications: cobicistat itraconazole This medicine may interact with the following medications: antiviral medicines for HIV or AIDS certain antibiotics like rifampin or rifabutin certain medicines for fungal infections like ketoconazole, posaconazole, and voriconazole certain medicines for seizures like carbamazepine, phenobarbital, phenotoin clarithromycin gemfibrozil nefazodone [facility]. John's Wort This list may not describe all possible interactions. Give your health care provider a list of all the medicines, herbs, non-prescription drugs, or dietary supplements you use. Also tell them if you smoke, drink alcohol, or use illegal drugs. Some items may interact with your medicine. What should I watch for while using this medication? Your condition will be monitored carefully while you are receiving this medicine. You will need important blood work done while you are taking this medicine. This drug may make you feel generally unwell. This is not uncommon, as chemotherapy can affect healthy cells as well as cancer cells. Report any side effects. Continue your course of treatment even though you feel ill unless your doctor tells you  to stop. In some cases, you may be given additional medicines to help with side effects. Follow all directions for their use. You may get drowsy or dizzy. Do not drive, use machinery, or do anything that needs mental alertness until you know how this medicine affects you. Do not stand or sit up quickly, especially if you are an older patient. This reduces the risk of dizzy or fainting spells. Call your health care professional for advice if you get a fever, chills, or sore throat, or other symptoms of a cold or flu. Do not treat yourself. This medicine decreases your body's ability to fight infections. Try to avoid being around  people who are sick. Avoid taking products that contain aspirin, acetaminophen, ibuprofen, naproxen, or ketoprofen unless instructed by your doctor. These medicines may hide a fever. This medicine may increase your risk to bruise or bleed. Call your doctor or health care professional if you notice any unusual bleeding. Be careful brushing and flossing your teeth or using a toothpick because you may get an infection or bleed more easily. If you have any dental work done, tell your dentist you are receiving this medicine. Do not become pregnant while taking this medicine or for 6 months after stopping it. Women should inform their health care professional if they wish to become pregnant or think they might be pregnant. Men should not father a child while taking this medicine and for 3 months after stopping it. There is potential for serious side effects to an unborn child. Talk to your health care professional for more information. Do not breast-feed an infant while taking this medicine or for 7 days after stopping it. This medicine has caused ovarian failure in some women. This medicine may make it more difficult to get pregnant. Talk to your health care professional if you are concerned about your fertility. This medicine has caused decreased sperm counts in some men. This may make it more difficult to father a child. Talk to your health care professional if you are concerned about your fertility. What side effects may I notice from receiving this medication? Side effects that you should report to your doctor or health care professional as soon as possible: allergic reactions like skin rash, itching or hives, swelling of the face, lips, or tongue chest pain diarrhea flushing, runny nose, sweating during infusion low blood counts - this medicine may decrease the number of white blood cells, red blood cells and platelets. You may be at increased risk for infections and bleeding. nausea, vomiting pain,  swelling, warmth in the leg signs of decreased platelets or bleeding - bruising, pinpoint red spots on the skin, black, tarry stools, blood in the urine signs of infection - fever or chills, cough, sore throat, pain or difficulty passing urine signs of decreased red blood cells - unusually weak or tired, fainting spells, lightheadedness Side effects that usually do not require medical attention (report to your doctor or health care professional if they continue or are bothersome): constipation hair loss headache loss of appetite mouth sores stomach pain This list may not describe all possible side effects. Call your doctor for medical advice about side effects. You may report side effects to FDA at 1-800-FDA-1088. Where should I keep my medication? This drug is given in a hospital or clinic and will not be stored at home. NOTE: This sheet is a summary. It may not cover all possible information. If you have questions about this medicine, talk to your doctor, pharmacist, or health  care provider.  2022 [employer]/Gold Standard (2020-12-16 00:00:00)  Leucovorin injection What is this medication? LEUCOVORIN (loo koe VOR in) is used to prevent or treat the harmful effects of some medicines. This medicine is used to treat anemia caused by a low amount of folic acid in the body. It is also used with 5-fluorouracil (5-FU) to treat colon cancer. This medicine may be used for other purposes; ask your health care provider or pharmacist if you have questions. What should I tell my care team before I take this medication? They need to know if you have any of these conditions: anemia from low levels of vitamin B-12 in the blood an unusual or allergic reaction to leucovorin, folic acid, other medicines, foods, dyes, or preservatives pregnant or trying to get pregnant breast-feeding How should I use this medication? This medicine is for injection into a muscle or into a vein. It is given by a health care  professional in a hospital or clinic setting. Talk to your pediatrician regarding the use of this medicine in children. Special care may be needed. Overdosage: If you think you have taken too much of this medicine contact a poison control center or emergency room at once. NOTE: This medicine is only for you. Do not share this medicine with others. What if I miss a dose? This does not apply. What may interact with this medication? capecitabine fluorouracil phenobarbital phenytoin primidone trimethoprim-sulfamethoxazole This list may not describe all possible interactions. Give your health care provider a list of all the medicines, herbs, non-prescription drugs, or dietary supplements you use. Also tell them if you smoke, drink alcohol, or use illegal drugs. Some items may interact with your medicine. What should I watch for while using this medication? Your condition will be monitored carefully while you are receiving this medicine. This medicine may increase the side effects of 5-fluorouracil, 5-FU. Tell your doctor or health care professional if you have diarrhea or mouth sores that do not get better or that get worse. What side effects may I notice from receiving this medication? Side effects that you should report to your doctor or health care professional as soon as possible: allergic reactions like skin rash, itching or hives, swelling of the face, lips, or tongue breathing problems fever, infection mouth sores unusual bleeding or bruising unusually weak or tired Side effects that usually do not require medical attention (report to your doctor or health care professional if they continue or are bothersome): constipation or diarrhea loss of appetite nausea, vomiting This list may not describe all possible side effects. Call your doctor for medical advice about side effects. You may report side effects to FDA at 1-800-FDA-1088. Where should I keep my medication? This drug is given  in a hospital or clinic and will not be stored at home. NOTE: This sheet is a summary. It may not cover all possible information. If you have questions about this medicine, talk to your doctor, pharmacist, or health care provider.  2022 Elsevier/Gold Standard (2007-10-05 00:00:00)  Fluorouracil, 5-FU injection What is this medication? FLUOROURACIL, 5-FU (flure oh YOOR a sil) is a chemotherapy drug. It slows the growth of cancer cells. This medicine is used to treat many types of cancer like breast cancer, colon or rectal cancer, pancreatic cancer, and stomach cancer. This medicine may be used for other purposes; ask your health care provider or pharmacist if you have questions. COMMON BRAND NAME(S): Adrucil What should I tell my care team before I take this medication? They need  to know if you have any of these conditions: blood disorders dihydropyrimidine dehydrogenase (DPD) deficiency infection (especially a virus infection such as chickenpox, cold sores, or herpes) kidney disease liver disease malnourished, poor nutrition recent or ongoing radiation therapy an unusual or allergic reaction to fluorouracil, other chemotherapy, other medicines, foods, dyes, or preservatives pregnant or trying to get pregnant breast-feeding How should I use this medication? This drug is given as an infusion or injection into a vein. It is administered in a hospital or clinic by a specially trained health care professional. Talk to your pediatrician regarding the use of this medicine in children. Special care may be needed. Overdosage: If you think you have taken too much of this medicine contact a poison control center or emergency room at once. NOTE: This medicine is only for you. Do not share this medicine with others. What if I miss a dose? It is important not to miss your dose. Call your doctor or health care professional if you are unable to keep an appointment. What may interact with this  medication? Do not take this medicine with any of the following medications: live virus vaccines This medicine may also interact with the following medications: medicines that treat or prevent blood clots like warfarin, enoxaparin, and dalteparin This list may not describe all possible interactions. Give your health care provider a list of all the medicines, herbs, non-prescription drugs, or dietary supplements you use. Also tell them if you smoke, drink alcohol, or use illegal drugs. Some items may interact with your medicine. What should I watch for while using this medication? Visit your doctor for checks on your progress. This drug may make you feel generally unwell. This is not uncommon, as chemotherapy can affect healthy cells as well as cancer cells. Report any side effects. Continue your course of treatment even though you feel ill unless your doctor tells you to stop. In some cases, you may be given additional medicines to help with side effects. Follow all directions for their use. Call your doctor or health care professional for advice if you get a fever, chills or sore throat, or other symptoms of a cold or flu. Do not treat yourself. This drug decreases your body's ability to fight infections. Try to avoid being around people who are sick. This medicine may increase your risk to bruise or bleed. Call your doctor or health care professional if you notice any unusual bleeding. Be careful brushing and flossing your teeth or using a toothpick because you may get an infection or bleed more easily. If you have any dental work done, tell your dentist you are receiving this medicine. Avoid taking products that contain aspirin, acetaminophen, ibuprofen, naproxen, or ketoprofen unless instructed by your doctor. These medicines may hide a fever. Do not become pregnant while taking this medicine. Women should inform their doctor if they wish to become pregnant or think they might be pregnant. There is  a potential for serious side effects to an unborn child. Talk to your health care professional or pharmacist for more information. Do not breast-feed an infant while taking this medicine. Men should inform their doctor if they wish to father a child. This medicine may lower sperm counts. Do not treat diarrhea with over the counter products. Contact your doctor if you have diarrhea that lasts more than 2 days or if it is severe and watery. This medicine can make you more sensitive to the sun. Keep out of the sun. If you cannot avoid being in  the sun, wear protective clothing and use sunscreen. Do not use sun lamps or tanning beds/booths. What side effects may I notice from receiving this medication? Side effects that you should report to your doctor or health care professional as soon as possible: allergic reactions like skin rash, itching or hives, swelling of the face, lips, or tongue low blood counts - this medicine may decrease the number of white blood cells, red blood cells and platelets. You may be at increased risk for infections and bleeding. signs of infection - fever or chills, cough, sore throat, pain or difficulty passing urine signs of decreased platelets or bleeding - bruising, pinpoint red spots on the skin, black, tarry stools, blood in the urine signs of decreased red blood cells - unusually weak or tired, fainting spells, lightheadedness breathing problems changes in vision chest pain mouth sores nausea and vomiting pain, swelling, redness at site where injected pain, tingling, numbness in the hands or feet redness, swelling, or sores on hands or feet stomach pain unusual bleeding Side effects that usually do not require medical attention (report to your doctor or health care professional if they continue or are bothersome): changes in finger or toe nails diarrhea dry or itchy skin hair loss headache loss of appetite sensitivity of eyes to the light stomach  upset unusually teary eyes This list may not describe all possible side effects. Call your doctor for medical advice about side effects. You may report side effects to FDA at 1-800-FDA-1088. Where should I keep my medication? This drug is given in a hospital or clinic and will not be stored at home. NOTE: This sheet is a summary. It may not cover all possible information. If you have questions about this medicine, talk to your doctor, pharmacist, or health care provider.  2022 [employer]vier/Gold Standard (2020-12-16 00:00:00)

## 2021-03-31 NOTE — Progress Notes (Signed)
Patient seen by Lisa Thomas NP today  Vitals are within treatment parameters.  Labs reviewed by Lisa Thomas NP and are within treatment parameters.  Per physician team, patient is ready for treatment and there are NO modifications to the treatment plan.     

## 2021-04-02 ENCOUNTER — Inpatient Hospital Stay: Payer: Medicare Other

## 2021-04-02 ENCOUNTER — Other Ambulatory Visit: Payer: Self-pay

## 2021-04-02 VITALS — BP 130/69 | HR 87 | Temp 98.7°F | Resp 18

## 2021-04-02 DIAGNOSIS — Z5111 Encounter for antineoplastic chemotherapy: Secondary | ICD-10-CM | POA: Diagnosis not present

## 2021-04-02 DIAGNOSIS — Z170 Estrogen receptor positive status [ER+]: Secondary | ICD-10-CM

## 2021-04-02 DIAGNOSIS — C787 Secondary malignant neoplasm of liver and intrahepatic bile duct: Secondary | ICD-10-CM

## 2021-04-02 MED ORDER — SODIUM CHLORIDE 0.9% FLUSH
10.0000 mL | INTRAVENOUS | Status: DC | PRN
  Administered 2021-04-02: 12:00:00 10 mL

## 2021-04-02 MED ORDER — HEPARIN SOD (PORK) LOCK FLUSH 100 UNIT/ML IV SOLN
500.0000 [IU] | Freq: Once | INTRAVENOUS | Status: AC | PRN
  Administered 2021-04-02: 12:00:00 500 [IU]

## 2021-04-02 MED ORDER — PEGFILGRASTIM-CBQV 6 MG/0.6ML ~~LOC~~ SOSY
6.0000 mg | PREFILLED_SYRINGE | Freq: Once | SUBCUTANEOUS | Status: AC
  Administered 2021-04-02: 12:00:00 6 mg via SUBCUTANEOUS

## 2021-04-02 NOTE — Patient Instructions (Signed)
[facility]  Discharge Instructions: Thank you for choosing [facility] to provide your oncology and hematology care.   If you have a lab appointment with the Cancer Center, please go directly to the Cancer Center and check in at the registration area.   Wear comfortable clothing and clothing appropriate for easy access to any Portacath or PICC line.   We strive to give you quality time with your provider. You may need to reschedule your appointment if you arrive late (15 or more minutes).  Arriving late affects you and other patients whose appointments are after yours.  Also, if you miss three or more appointments without notifying the office, you may be dismissed from the clinic at the provider's discretion.      For prescription refill requests, have your pharmacy contact our office and allow 72 hours for refills to be completed.    Today you received the following Udenyca    To help prevent nausea and vomiting after your treatment, we encourage you to take your nausea medication as directed.  BELOW ARE SYMPTOMS THAT SHOULD BE REPORTED IMMEDIATELY: *FEVER GREATER THAN 100.4 F (38 C) OR HIGHER *CHILLS OR SWEATING *NAUSEA AND VOMITING THAT IS NOT CONTROLLED WITH YOUR NAUSEA MEDICATION *UNUSUAL SHORTNESS OF BREATH *UNUSUAL BRUISING OR BLEEDING *URINARY PROBLEMS (pain or burning when urinating, or frequent urination) *BOWEL PROBLEMS (unusual diarrhea, constipation, pain near the anus) TENDERNESS IN MOUTH AND THROAT WITH OR WITHOUT PRESENCE OF ULCERS (sore throat, sores in mouth, or a toothache) UNUSUAL RASH, SWELLING OR PAIN  UNUSUAL VAGINAL DISCHARGE OR ITCHING   Items with * indicate a potential emergency and should be followed up as soon as possible or go to the Emergency Department if any problems should occur.  Please show the CHEMOTHERAPY ALERT CARD or IMMUNOTHERAPY ALERT CARD at check-in to the Emergency Department and triage  nurse.  Should you have questions after your visit or need to cancel or reschedule your appointment, please contact [facility]  Dept: [phone]  and follow the prompts.  Office hours are 8:00 a.m. to 4:30 p.m. Monday - Friday. Please note that voicemails left after 4:00 p.m. may not be returned until the following business day.  We are closed weekends and major holidays. You have access to a nurse at all times for urgent questions. Please call the main number to the clinic Dept: [phone] and follow the prompts.   For any non-urgent questions, you may also contact your provider using MyChart. We now offer e-Visits for anyone 18 and older to request care online for non-urgent symptoms. For details visit [email or web address].   Also download the MyChart app! Go to the app store, search "MyChart", open the app, select , and log in with your MyChart username and password.  Due to Covid, a mask is required upon entering the hospital/clinic. If you do not have a mask, one will be given to you upon arrival. For doctor visits, patients may have 1 support person aged 18 or older with them. For treatment visits, patients cannot have anyone with them due to current Covid guidelines and our immunocompromised population.   Pegfilgrastim Injection What is this medication? PEGFILGRASTIM (PEG fil gra stim) lowers the risk of infection in people who are receiving chemotherapy. It works by helping your body make more white blood cells, which protects your body from infection. It may also be used to help people who have been exposed to high doses of   radiation. This medicine may be used for other purposes; ask your health care provider or pharmacist if you have questions. COMMON BRAND NAME(S): Fulphila, Neulasta, Nyvepria, UDENYCA, Ziextenzo What should I tell my care team before I take this medication? They need to know if you have any of these conditions: Kidney  disease Latex allergy Ongoing radiation therapy Sickle cell disease Skin reactions to acrylic adhesives (On-Body Injector only) An unusual or allergic reaction to pegfilgrastim, filgrastim, other medications, foods, dyes, or preservatives Pregnant or trying to get pregnant Breast-feeding How should I use this medication? This medication is for injection under the skin. If you get this medication at home, you will be taught how to prepare and give the pre-filled syringe or how to use the On-body Injector. Refer to the patient Instructions for Use for detailed instructions. Use exactly as directed. Tell your care team immediately if you suspect that the On-body Injector may not have performed as intended or if you suspect the use of the On-body Injector resulted in a missed or partial dose. It is important that you put your used needles and syringes in a special sharps container. Do not put them in a trash can. If you do not have a sharps container, call your pharmacist or care team to get one. Talk to your care team about the use of this medication in children. While this medication may be prescribed for selected conditions, precautions do apply. Overdosage: If you think you have taken too much of this medicine contact a poison control center or emergency room at once. NOTE: This medicine is only for you. Do not share this medicine with others. What if I miss a dose? It is important not to miss your dose. Call your care team if you miss your dose. If you miss a dose due to an On-body Injector failure or leakage, a new dose should be administered as soon as possible using a single prefilled syringe for manual use. What may interact with this medication? Interactions have not been studied. This list may not describe all possible interactions. Give your health care provider a list of all the medicines, herbs, non-prescription drugs, or dietary supplements you use. Also tell them if you smoke, drink  alcohol, or use illegal drugs. Some items may interact with your medicine. What should I watch for while using this medication? Your condition will be monitored carefully while you are receiving this medication. You may need blood work done while you are taking this medication. Talk to your care team about your risk of cancer. You may be more at risk for certain types of cancer if you take this medication. If you are going to need a MRI, CT scan, or other procedure, tell your care team that you are using this medication (On-Body Injector only). What side effects may I notice from receiving this medication? Side effects that you should report to your care team as soon as possible: Allergic reactions--skin rash, itching, hives, swelling of the face, lips, tongue, or throat Capillary leak syndrome--stomach or muscle pain, unusual weakness or fatigue, feeling faint or lightheaded, decrease in the amount of urine, swelling of the ankles, hands, or feet, trouble breathing High white blood cell level--fever, fatigue, trouble breathing, night sweats, change in vision, weight loss Inflammation of the aorta--fever, fatigue, back, chest, or stomach pain, severe headache Kidney injury (glomerulonephritis)--decrease in the amount of urine, red or dark brown urine, foamy or bubbly urine, swelling of the ankles, hands, or feet Shortness of breath or   trouble breathing Spleen injury--pain in upper left stomach or shoulder Unusual bruising or bleeding Side effects that usually do not require medical attention (report to your care team if they continue or are bothersome): Bone pain Pain in the hands or feet This list may not describe all possible side effects. Call your doctor for medical advice about side effects. You may report side effects to FDA at 1-800-FDA-1088. Where should I keep my medication? Keep out of the reach of children. If you are using this medication at home, you will be instructed on how to  store it. Throw away any unused medication after the expiration date on the label. NOTE: This sheet is a summary. It may not cover all possible information. If you have questions about this medicine, talk to your doctor, pharmacist, or health care provider.  2022 Elsevier/Gold Standard (2020-12-16 00:00:00)  

## 2021-04-13 ENCOUNTER — Other Ambulatory Visit: Payer: Self-pay | Admitting: Oncology

## 2021-04-15 ENCOUNTER — Other Ambulatory Visit: Payer: Self-pay

## 2021-04-15 ENCOUNTER — Encounter: Payer: Self-pay | Admitting: Nurse Practitioner

## 2021-04-15 ENCOUNTER — Inpatient Hospital Stay: Payer: Medicare Other | Attending: Nurse Practitioner

## 2021-04-15 ENCOUNTER — Inpatient Hospital Stay (HOSPITAL_BASED_OUTPATIENT_CLINIC_OR_DEPARTMENT_OTHER): Payer: Medicare Other | Admitting: Nurse Practitioner

## 2021-04-15 ENCOUNTER — Other Ambulatory Visit: Payer: Medicare Other

## 2021-04-15 ENCOUNTER — Ambulatory Visit: Payer: Medicare Other | Admitting: Oncology

## 2021-04-15 ENCOUNTER — Ambulatory Visit: Payer: Medicare Other

## 2021-04-15 ENCOUNTER — Inpatient Hospital Stay: Payer: Medicare Other

## 2021-04-15 VITALS — BP 134/78 | HR 73 | Temp 98.5°F | Resp 20 | Ht 70.0 in | Wt 133.6 lb

## 2021-04-15 VITALS — BP 126/80 | HR 70 | Temp 98.5°F | Resp 18

## 2021-04-15 DIAGNOSIS — E039 Hypothyroidism, unspecified: Secondary | ICD-10-CM | POA: Insufficient documentation

## 2021-04-15 DIAGNOSIS — Z79899 Other long term (current) drug therapy: Secondary | ICD-10-CM | POA: Insufficient documentation

## 2021-04-15 DIAGNOSIS — Z853 Personal history of malignant neoplasm of breast: Secondary | ICD-10-CM | POA: Diagnosis not present

## 2021-04-15 DIAGNOSIS — I4891 Unspecified atrial fibrillation: Secondary | ICD-10-CM | POA: Insufficient documentation

## 2021-04-15 DIAGNOSIS — Z5111 Encounter for antineoplastic chemotherapy: Secondary | ICD-10-CM | POA: Insufficient documentation

## 2021-04-15 DIAGNOSIS — D508 Other iron deficiency anemias: Secondary | ICD-10-CM | POA: Diagnosis not present

## 2021-04-15 DIAGNOSIS — Z5189 Encounter for other specified aftercare: Secondary | ICD-10-CM | POA: Insufficient documentation

## 2021-04-15 DIAGNOSIS — Z170 Estrogen receptor positive status [ER+]: Secondary | ICD-10-CM

## 2021-04-15 DIAGNOSIS — C787 Secondary malignant neoplasm of liver and intrahepatic bile duct: Secondary | ICD-10-CM

## 2021-04-15 DIAGNOSIS — F17210 Nicotine dependence, cigarettes, uncomplicated: Secondary | ICD-10-CM | POA: Diagnosis not present

## 2021-04-15 DIAGNOSIS — Z7901 Long term (current) use of anticoagulants: Secondary | ICD-10-CM | POA: Diagnosis not present

## 2021-04-15 DIAGNOSIS — C180 Malignant neoplasm of cecum: Secondary | ICD-10-CM | POA: Insufficient documentation

## 2021-04-15 DIAGNOSIS — I480 Paroxysmal atrial fibrillation: Secondary | ICD-10-CM

## 2021-04-15 DIAGNOSIS — C189 Malignant neoplasm of colon, unspecified: Secondary | ICD-10-CM

## 2021-04-15 DIAGNOSIS — C50411 Malignant neoplasm of upper-outer quadrant of right female breast: Secondary | ICD-10-CM

## 2021-04-15 LAB — CMP (CANCER CENTER ONLY)
ALT: 13 U/L (ref 0–44)
AST: 25 U/L (ref 15–41)
Albumin: 4.1 g/dL (ref 3.5–5.0)
Alkaline Phosphatase: 217 U/L — ABNORMAL HIGH (ref 38–126)
Anion gap: 7 (ref 5–15)
BUN: 13 mg/dL (ref 8–23)
CO2: 31 mmol/L (ref 22–32)
Calcium: 9.5 mg/dL (ref 8.9–10.3)
Chloride: 100 mmol/L (ref 98–111)
Creatinine: 0.52 mg/dL (ref 0.44–1.00)
GFR, Estimated: >60 mL/min (ref >60)
Glucose, Bld: 184 mg/dL — ABNORMAL HIGH (ref 70–99)
Potassium: 3.6 mmol/L (ref 3.5–5.1)
Sodium: 138 mmol/L (ref 135–145)
Total Bilirubin: 0.4 mg/dL (ref 0.3–1.2)
Total Protein: 7.2 g/dL (ref 6.5–8.1)

## 2021-04-15 LAB — CBC WITH DIFFERENTIAL (CANCER CENTER ONLY)
Abs Immature Granulocytes: 0.08 10*3/uL — ABNORMAL HIGH (ref 0.00–0.07)
Basophils Absolute: 0.1 10*3/uL (ref 0.0–0.1)
Basophils Relative: 1 %
Eosinophils Absolute: 0 10*3/uL (ref 0.0–0.5)
Eosinophils Relative: 0 %
HCT: 38.9 % (ref 36.0–46.0)
Hemoglobin: 12.1 g/dL (ref 12.0–15.0)
Immature Granulocytes: 1 %
Lymphocytes Relative: 5 %
Lymphs Abs: 0.6 10*3/uL — ABNORMAL LOW (ref 0.7–4.0)
MCH: 29.4 pg (ref 26.0–34.0)
MCHC: 31.1 g/dL (ref 30.0–36.0)
MCV: 94.4 fL (ref 80.0–100.0)
Monocytes Absolute: 0.9 10*3/uL (ref 0.1–1.0)
Monocytes Relative: 8 %
Neutro Abs: 10.1 10*3/uL — ABNORMAL HIGH (ref 1.7–7.7)
Neutrophils Relative %: 85 %
Platelet Count: 115 10*3/uL — ABNORMAL LOW (ref 150–400)
RBC: 4.12 MIL/uL (ref 3.87–5.11)
RDW: 18 % — ABNORMAL HIGH (ref 11.5–15.5)
WBC Count: 11.8 10*3/uL — ABNORMAL HIGH (ref 4.0–10.5)
nRBC: 0 % (ref 0.0–0.2)

## 2021-04-15 LAB — PROTIME-INR
INR: 1.9 — ABNORMAL HIGH (ref 0.8–1.2)
Prothrombin Time: 21.6 seconds — ABNORMAL HIGH (ref 11.4–15.2)

## 2021-04-15 MED ORDER — SODIUM CHLORIDE 0.9 % IV SOLN
2400.0000 mg/m2 | INTRAVENOUS | Status: DC
  Administered 2021-04-15: 4000 mg via INTRAVENOUS
  Filled 2021-04-15: qty 80

## 2021-04-15 MED ORDER — SODIUM CHLORIDE 0.9 % IV SOLN: Freq: Once | INTRAVENOUS | Status: AC

## 2021-04-15 MED ORDER — SODIUM CHLORIDE 0.9 % IV SOLN
150.0000 mg | Freq: Once | INTRAVENOUS | Status: AC
  Administered 2021-04-15: 150 mg via INTRAVENOUS
  Filled 2021-04-15: qty 5

## 2021-04-15 MED ORDER — SODIUM CHLORIDE 0.9 % IV SOLN
180.0000 mg/m2 | Freq: Once | INTRAVENOUS | Status: AC
  Administered 2021-04-15: 300 mg via INTRAVENOUS
  Filled 2021-04-15: qty 15

## 2021-04-15 MED ORDER — SODIUM CHLORIDE 0.9 % IV SOLN
10.0000 mg | Freq: Once | INTRAVENOUS | Status: AC
  Administered 2021-04-15: 10 mg via INTRAVENOUS
  Filled 2021-04-15: qty 1

## 2021-04-15 MED ORDER — ATROPINE SULFATE 1 MG/ML IV SOLN
0.5000 mg | Freq: Once | INTRAVENOUS | Status: AC | PRN
  Administered 2021-04-15: 0.5 mg via INTRAVENOUS
  Filled 2021-04-15: qty 1

## 2021-04-15 MED ORDER — FLUOROURACIL CHEMO INJECTION 2.5 GM/50ML
400.0000 mg/m2 | Freq: Once | INTRAVENOUS | Status: AC
  Administered 2021-04-15: 650 mg via INTRAVENOUS
  Filled 2021-04-15: qty 13

## 2021-04-15 MED ORDER — SODIUM CHLORIDE 0.9 % IV SOLN
400.0000 mg/m2 | Freq: Once | INTRAVENOUS | Status: AC
  Administered 2021-04-15: 668 mg via INTRAVENOUS
  Filled 2021-04-15: qty 33.4

## 2021-04-15 MED ORDER — PALONOSETRON HCL INJECTION 0.25 MG/5ML
0.2500 mg | Freq: Once | INTRAVENOUS | Status: AC
  Administered 2021-04-15: 0.25 mg via INTRAVENOUS
  Filled 2021-04-15: qty 5

## 2021-04-15 NOTE — Progress Notes (Signed)
[facility] OFFICE PROGRESS NOTE   Diagnosis: Colon cancer  INTERVAL HISTORY:   Sabrina [name] returns as scheduled.  She completed cycle 4 FOLFIRI 03/31/2021.  She denies nausea/vomiting.  No mouth sores.  No diarrhea.  She denies pain.  She has a good appetite.  No bleeding.  Objective:  Vital signs in last 24 hours:  Blood pressure 134/78, pulse 73, temperature 98.5 F (36.9 C), temperature source Oral, resp. rate 20, height _0  (1.778 m), weight 133 lb 9.6 oz (60.6 kg), SpO2 100 %.    HEENT: No thrush or ulcers. Resp: Lungs clear bilaterally. Cardio: Regular rate and rhythm. GI: Abdomen soft and nontender.  No hepatomegaly. Vascular: No leg edema.  Left lower leg is slightly larger than the right lower leg. Port-A-Cath without erythema.   Lab Results:  Lab Results  Component Value Date   WBC 11.8 (H) 04/15/2021   HGB 12.1 04/15/2021   HCT 38.9 04/15/2021   MCV 94.4 04/15/2021   PLT 115 (L) 04/15/2021   NEUTROABS 10.1 (H) 04/15/2021    Imaging:  No results found.  Medications: I have reviewed the patient's current medications.  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, lymphovascular invasion present including large vessel extramural invasion, loss of MLH1 and PMS2, MLH1 hyper methylation present Incomplete colonoscopy secondary to stricturing at the sigmoid colon Virtual colonoscopy 03/13/2020- mass centered at the medial wall of the cecum and terminal ileum, adjacent ileocolic mesenteric adenopathy, nonspecific 3 mm right lower lobe nodule PET 47/42/5956 hypermetabolic cecal/terminal ileum mass with adjacent enlarged and hypermetabolic ileocolic lymph nodes, previously noted segment 7 liver lesion-not hypermetabolic- benign etiology favored, dominant left lower lobe pulmonary nodule not hypermetabolic, other tiny nodules not  hypermetabolic and below PET sensitivity Cycle 1 FOLFOX 05/12/2020 Cycle 2 FOLFOX 05/26/2020 Cycle 3 FOLFOX 06/09/2020 Cycle 4 FOLFOX 06/23/2020 Cycle 5 FOLFOX 07/07/2020 Cycle 12 FOLFOX 10/14/2020, oxaliplatin dose reduced beginning with cycle 9 CT 12/04/2020-development of peripheral enhancing hypoattenuating mass in segment 5/8, necrotic porta hepatis lymphadenopathy, similar appearance of previously visualized nonhypermetabolic subcapsular segment 7 lesion Ultrasound-guided biopsy right liver lesion 12/22/2020-adenocarcinoma consistent with a colon primary PET scan 12/30/2020-hypermetabolic hepatic lesion and portal adenopathy-increased in size compared to 12/04/2020 CT, small hypermetabolic left periaortic node Foundation 1-MSI high, tumor mutation burden 31, K-ras wild-type Cycle 1 Pembrolizumab 01/15/2021 Cycle 1 FOLFIRI 02/16/2021 Cycle 2 FOLFIRI 03/02/2021-Emend and Udenyca added Cycle 3 FOLFIRI 03/17/2021 Cycle 4 FOLFIRI 03/31/2021 Cycle 5 FOLFIRI 04/15/2021 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  She has completed 4 cycles of FOLFIRI.  She is tolerating treatment well and performance status continues to be improved.  Plan to proceed with cycle 5 today as scheduled.  Restaging CTs prior to next office visit.  CBC, chemistry panel and PT/INR reviewed.  Labs adequate to proceed with treatment.  She will continue Coumadin at the current dose.  She will return for follow-up in 2 weeks,  restaging CTs a few days prior.    Ned Card ANP/GNP-BC   04/15/2021  10:57 AM

## 2021-04-15 NOTE — Patient Instructions (Signed)
[facility]  The chemotherapy medication bag should finish at 46 hours, 96 hours, or 7 days. For example, if your pump is scheduled for 46 hours and it was put on at 4:00 p.m., it should finish at 2:00 p.m. the day it is scheduled to come off regardless of your appointment time.     Estimated time to finish at  11:45 Thursday, April 02, 2021.   If the display on your pump reads "Low Volume" and it is beeping, take the batteries out of the pump and come to the cancer center for it to be taken off.   If the pump alarms go off prior to the pump reading "Low Volume" then call [phone] and someone can assist you.  If the plunger comes out and the chemotherapy medication is leaking out, please use your home chemo spill kit to clean up the spill. Do NOT use paper towels or other household products.  If you have problems or questions regarding your pump, please call either [phone] (24 hours a day) or the cancer center Monday-Friday 8:00 a.m.- 4:30 p.m. at the clinic number and we will assist you. If you are unable to get assistance, then go to the nearest Emergency Department and ask the staff to contact the IV team for assistance.   Discharge Instructions: Thank you for choosing [facility] to provide your oncology and hematology care.   If you have a lab appointment with the [facility], please go directly to the [facility] and check in at the registration area.   Wear comfortable clothing and clothing appropriate for easy access to any Portacath or PICC line.   We strive to give you quality time with your provider. You may need to reschedule your appointment if you arrive late (15 or more minutes).  Arriving late affects you and other patients whose appointments are after yours.  Also, if you miss three or more appointments without notifying the office, you may be dismissed from the clinic at the providers discretion.      For  prescription refill requests, have your pharmacy contact our office and allow 72 hours for refills to be completed.    Today you received the following chemotherapy and/or immunotherapy agents Irinotecan, leucovorin, fluorouracil      To help prevent nausea and vomiting after your treatment, we encourage you to take your nausea medication as directed.  BELOW ARE SYMPTOMS THAT SHOULD BE REPORTED IMMEDIATELY: *FEVER GREATER THAN 100.4 F (38 C) OR HIGHER *CHILLS OR SWEATING *NAUSEA AND VOMITING THAT IS NOT CONTROLLED WITH YOUR NAUSEA MEDICATION *UNUSUAL SHORTNESS OF BREATH *UNUSUAL BRUISING OR BLEEDING *URINARY PROBLEMS (pain or burning when urinating, or frequent urination) *BOWEL PROBLEMS (unusual diarrhea, constipation, pain near the anus) TENDERNESS IN MOUTH AND THROAT WITH OR WITHOUT PRESENCE OF ULCERS (sore throat, sores in mouth, or a toothache) UNUSUAL RASH, SWELLING OR PAIN  UNUSUAL VAGINAL DISCHARGE OR ITCHING   Items with * indicate a potential emergency and should be followed up as soon as possible or go to the Emergency Department if any problems should occur.  Please show the CHEMOTHERAPY ALERT CARD or IMMUNOTHERAPY ALERT CARD at check-in to the Emergency Department and triage nurse.  Should you have questions after your visit or need to cancel or reschedule your appointment, please contact [facility]  Dept: [phone]  and follow the prompts.  Office hours are 8:00 a.m. to 4:30 p.m. Monday - Friday. Please note that  voicemails left after 4:00 p.m. may not be returned until the following business day.  We are closed weekends and major holidays. You have access to a nurse at all times for urgent questions. Please call the main number to the clinic Dept: [phone] and follow the prompts.   For any non-urgent questions, you may also contact your provider using MyChart. We now offer e-Visits for anyone 9 and older to request care online for  non-urgent symptoms. For details visit [email or web address].   Also download the MyChart app! Go to the app store, search "MyChart", open the app, select [facility], and log in with your MyChart username and password.  Due to Covid, a mask is required upon entering the hospital/clinic. If you do not have a mask, one will be given to you upon arrival. For doctor visits, patients may have 1 support person aged 77 or older with them. For treatment visits, patients cannot have anyone with them due to cur Irinotecan injection What is this medication? IRINOTECAN (ir in oh TEE kan ) is a chemotherapy drug. It is used to treat colon and rectal cancer. This medicine may be used for other purposes; ask your health care provider or pharmacist if you have questions. COMMON BRAND NAME(S): Camptosar What should I tell my care team before I take this medication? They need to know if you have any of these conditions: dehydration diarrhea infection (especially a virus infection such as chickenpox, cold sores, or herpes) liver disease low blood counts, like low white cell, platelet, or red cell counts low levels of calcium, magnesium, or potassium in the blood recent or ongoing radiation therapy an unusual or allergic reaction to irinotecan, other medicines, foods, dyes, or preservatives pregnant or trying to get pregnant breast-feeding How should I use this medication? This drug is given as an infusion into a vein. It is administered in a hospital or clinic by a specially trained health care professional. Talk to your pediatrician regarding the use of this medicine in children. Special care may be needed. Overdosage: If you think you have taken too much of this medicine contact a poison control center or emergency room at once. NOTE: This medicine is only for you. Do not share this medicine with others. What if I miss a dose? It is important not to miss your dose. Call your doctor or health care  professional if you are unable to keep an appointment. What may interact with this medication? Do not take this medicine with any of the following medications: cobicistat itraconazole This medicine may interact with the following medications: antiviral medicines for HIV or AIDS certain antibiotics like rifampin or rifabutin certain medicines for fungal infections like ketoconazole, posaconazole, and voriconazole certain medicines for seizures like carbamazepine, phenobarbital, phenotoin clarithromycin gemfibrozil nefazodone [facility]. John's Wort This list may not describe all possible interactions. Give your health care provider a list of all the medicines, herbs, non-prescription drugs, or dietary supplements you use. Also tell them if you smoke, drink alcohol, or use illegal drugs. Some items may interact with your medicine. What should I watch for while using this medication? Your condition will be monitored carefully while you are receiving this medicine. You will need important blood work done while you are taking this medicine. This drug may make you feel generally unwell. This is not uncommon, as chemotherapy can affect healthy cells as well as cancer cells. Report any side effects. Continue your course of treatment even though you feel ill unless your doctor tells you  to stop. In some cases, you may be given additional medicines to help with side effects. Follow all directions for their use. You may get drowsy or dizzy. Do not drive, use machinery, or do anything that needs mental alertness until you know how this medicine affects you. Do not stand or sit up quickly, especially if you are an older patient. This reduces the risk of dizzy or fainting spells. Call your health care professional for advice if you get a fever, chills, or sore throat, or other symptoms of a cold or flu. Do not treat yourself. This medicine decreases your body's ability to fight infections. Try to avoid being around  people who are sick. Avoid taking products that contain aspirin, acetaminophen, ibuprofen, naproxen, or ketoprofen unless instructed by your doctor. These medicines may hide a fever. This medicine may increase your risk to bruise or bleed. Call your doctor or health care professional if you notice any unusual bleeding. Be careful brushing and flossing your teeth or using a toothpick because you may get an infection or bleed more easily. If you have any dental work done, tell your dentist you are receiving this medicine. Do not become pregnant while taking this medicine or for 6 months after stopping it. Women should inform their health care professional if they wish to become pregnant or think they might be pregnant. Men should not father a child while taking this medicine and for 3 months after stopping it. There is potential for serious side effects to an unborn child. Talk to your health care professional for more information. Do not breast-feed an infant while taking this medicine or for 7 days after stopping it. This medicine has caused ovarian failure in some women. This medicine may make it more difficult to get pregnant. Talk to your health care professional if you are concerned about your fertility. This medicine has caused decreased sperm counts in some men. This may make it more difficult to father a child. Talk to your health care professional if you are concerned about your fertility. What side effects may I notice from receiving this medication? Side effects that you should report to your doctor or health care professional as soon as possible: allergic reactions like skin rash, itching or hives, swelling of the face, lips, or tongue chest pain diarrhea flushing, runny nose, sweating during infusion low blood counts - this medicine may decrease the number of white blood cells, red blood cells and platelets. You may be at increased risk for infections and bleeding. nausea, vomiting pain,  swelling, warmth in the leg signs of decreased platelets or bleeding - bruising, pinpoint red spots on the skin, black, tarry stools, blood in the urine signs of infection - fever or chills, cough, sore throat, pain or difficulty passing urine signs of decreased red blood cells - unusually weak or tired, fainting spells, lightheadedness Side effects that usually do not require medical attention (report to your doctor or health care professional if they continue or are bothersome): constipation hair loss headache loss of appetite mouth sores stomach pain This list may not describe all possible side effects. Call your doctor for medical advice about side effects. You may report side effects to FDA at 1-800-FDA-1088. Where should I keep my medication? This drug is given in a hospital or clinic and will not be stored at home. NOTE: This sheet is a summary. It may not cover all possible information. If you have questions about this medicine, talk to your doctor, pharmacist, or health  care provider.  2022 Elsevier/Gold Standard (2020-12-16 00:00:00)  Leucovorin injection What is this medication? LEUCOVORIN (loo koe VOR in) is used to prevent or treat the harmful effects of some medicines. This medicine is used to treat anemia caused by a low amount of folic acid in the body. It is also used with 5-fluorouracil (5-FU) to treat colon cancer. This medicine may be used for other purposes; ask your health care provider or pharmacist if you have questions. What should I tell my care team before I take this medication? They need to know if you have any of these conditions: anemia from low levels of vitamin B-12 in the blood an unusual or allergic reaction to leucovorin, folic acid, other medicines, foods, dyes, or preservatives pregnant or trying to get pregnant breast-feeding How should I use this medication? This medicine is for injection into a muscle or into a vein. It is given by a health care  professional in a hospital or clinic setting. Talk to your pediatrician regarding the use of this medicine in children. Special care may be needed. Overdosage: If you think you have taken too much of this medicine contact a poison control center or emergency room at once. NOTE: This medicine is only for you. Do not share this medicine with others. What if I miss a dose? This does not apply. What may interact with this medication? capecitabine fluorouracil phenobarbital phenytoin primidone trimethoprim-sulfamethoxazole This list may not describe all possible interactions. Give your health care provider a list of all the medicines, herbs, non-prescription drugs, or dietary supplements you use. Also tell them if you smoke, drink alcohol, or use illegal drugs. Some items may interact with your medicine. What should I watch for while using this medication? Your condition will be monitored carefully while you are receiving this medicine. This medicine may increase the side effects of 5-fluorouracil, 5-FU. Tell your doctor or health care professional if you have diarrhea or mouth sores that do not get better or that get worse. What side effects may I notice from receiving this medication? Side effects that you should report to your doctor or health care professional as soon as possible: allergic reactions like skin rash, itching or hives, swelling of the face, lips, or tongue breathing problems fever, infection mouth sores unusual bleeding or bruising unusually weak or tired Side effects that usually do not require medical attention (report to your doctor or health care professional if they continue or are bothersome): constipation or diarrhea loss of appetite nausea, vomiting This list may not describe all possible side effects. Call your doctor for medical advice about side effects. You may report side effects to FDA at 1-800-FDA-1088. Where should I keep my medication? This drug is given  in a hospital or clinic and will not be stored at home. NOTE: This sheet is a summary. It may not cover all possible information. If you have questions about this medicine, talk to your doctor, pharmacist, or health care provider.  2022 Elsevier/Gold Standard (2007-10-05 00:00:00)  Fluorouracil, 5-FU injection What is this medication? FLUOROURACIL, 5-FU (flure oh YOOR a sil) is a chemotherapy drug. It slows the growth of cancer cells. This medicine is used to treat many types of cancer like breast cancer, colon or rectal cancer, pancreatic cancer, and stomach cancer. This medicine may be used for other purposes; ask your health care provider or pharmacist if you have questions. COMMON BRAND NAME(S): Adrucil What should I tell my care team before I take this medication? They need  to know if you have any of these conditions: blood disorders dihydropyrimidine dehydrogenase (DPD) deficiency infection (especially a virus infection such as chickenpox, cold sores, or herpes) kidney disease liver disease malnourished, poor nutrition recent or ongoing radiation therapy an unusual or allergic reaction to fluorouracil, other chemotherapy, other medicines, foods, dyes, or preservatives pregnant or trying to get pregnant breast-feeding How should I use this medication? This drug is given as an infusion or injection into a vein. It is administered in a hospital or clinic by a specially trained health care professional. Talk to your pediatrician regarding the use of this medicine in children. Special care may be needed. Overdosage: If you think you have taken too much of this medicine contact a poison control center or emergency room at once. NOTE: This medicine is only for you. Do not share this medicine with others. What if I miss a dose? It is important not to miss your dose. Call your doctor or health care professional if you are unable to keep an appointment. What may interact with this  medication? Do not take this medicine with any of the following medications: live virus vaccines This medicine may also interact with the following medications: medicines that treat or prevent blood clots like warfarin, enoxaparin, and dalteparin This list may not describe all possible interactions. Give your health care provider a list of all the medicines, herbs, non-prescription drugs, or dietary supplements you use. Also tell them if you smoke, drink alcohol, or use illegal drugs. Some items may interact with your medicine. What should I watch for while using this medication? Visit your doctor for checks on your progress. This drug may make you feel generally unwell. This is not uncommon, as chemotherapy can affect healthy cells as well as cancer cells. Report any side effects. Continue your course of treatment even though you feel ill unless your doctor tells you to stop. In some cases, you may be given additional medicines to help with side effects. Follow all directions for their use. Call your doctor or health care professional for advice if you get a fever, chills or sore throat, or other symptoms of a cold or flu. Do not treat yourself. This drug decreases your body's ability to fight infections. Try to avoid being around people who are sick. This medicine may increase your risk to bruise or bleed. Call your doctor or health care professional if you notice any unusual bleeding. Be careful brushing and flossing your teeth or using a toothpick because you may get an infection or bleed more easily. If you have any dental work done, tell your dentist you are receiving this medicine. Avoid taking products that contain aspirin, acetaminophen, ibuprofen, naproxen, or ketoprofen unless instructed by your doctor. These medicines may hide a fever. Do not become pregnant while taking this medicine. Women should inform their doctor if they wish to become pregnant or think they might be pregnant. There is  a potential for serious side effects to an unborn child. Talk to your health care professional or pharmacist for more information. Do not breast-feed an infant while taking this medicine. Men should inform their doctor if they wish to father a child. This medicine may lower sperm counts. Do not treat diarrhea with over the counter products. Contact your doctor if you have diarrhea that lasts more than 2 days or if it is severe and watery. This medicine can make you more sensitive to the sun. Keep out of the sun. If you cannot avoid being in  the sun, wear protective clothing and use sunscreen. Do not use sun lamps or tanning beds/booths. What side effects may I notice from receiving this medication? Side effects that you should report to your doctor or health care professional as soon as possible: allergic reactions like skin rash, itching or hives, swelling of the face, lips, or tongue low blood counts - this medicine may decrease the number of white blood cells, red blood cells and platelets. You may be at increased risk for infections and bleeding. signs of infection - fever or chills, cough, sore throat, pain or difficulty passing urine signs of decreased platelets or bleeding - bruising, pinpoint red spots on the skin, black, tarry stools, blood in the urine signs of decreased red blood cells - unusually weak or tired, fainting spells, lightheadedness breathing problems changes in vision chest pain mouth sores nausea and vomiting pain, swelling, redness at site where injected pain, tingling, numbness in the hands or feet redness, swelling, or sores on hands or feet stomach pain unusual bleeding Side effects that usually do not require medical attention (report to your doctor or health care professional if they continue or are bothersome): changes in finger or toe nails diarrhea dry or itchy skin hair loss headache loss of appetite sensitivity of eyes to the light stomach  upset unusually teary eyes This list may not describe all possible side effects. Call your doctor for medical advice about side effects. You may report side effects to FDA at 1-800-FDA-1088. Where should I keep my medication? This drug is given in a hospital or clinic and will not be stored at home. NOTE: This sheet is a summary. It may not cover all possible information. If you have questions about this medicine, talk to your doctor, pharmacist, or health care provider.  2022 Elsevier/Gold Standard (2020-12-16 00:00:00)

## 2021-04-15 NOTE — Progress Notes (Signed)
Patient seen by Lisa Thomas NP today  Vitals are within treatment parameters.  Labs reviewed by Lisa Thomas NP and are within treatment parameters.  Per physician team, patient is ready for treatment and there are NO modifications to the treatment plan.     

## 2021-04-15 NOTE — Progress Notes (Signed)
Patient presents for treatment. RN assessment completed along with the following:  Labs/vitals reviewed - Yes, and within treatment parameters.   Weight within 10% of previous measurement - Yes Oncology Treatment Attestation completed for current therapy- Yes, on date 02/04/21 Informed consent completed and reflects current therapy/intent - Yes, on date 02/16/21             Provider progress note reviewed - Today's provider note is not yet available. I reviewed the most recent oncology provider progress note in chart dated 03/31/21. Treatment/Antibody/Supportive plan reviewed - Yes, and there are no adjustments needed for today's treatment. S&H and other orders reviewed - Yes, and there are no additional orders identified. Previous treatment date reviewed - Yes, and the appropriate amount of time has elapsed between treatments. Clinic Hand Off Received from - Ned Card, NP  Patient to proceed with treatment.

## 2021-04-16 ENCOUNTER — Other Ambulatory Visit: Payer: Medicare Other

## 2021-04-17 ENCOUNTER — Other Ambulatory Visit: Payer: Self-pay

## 2021-04-17 ENCOUNTER — Inpatient Hospital Stay: Payer: Medicare Other

## 2021-04-17 ENCOUNTER — Ambulatory Visit: Payer: Medicare Other | Admitting: Hematology and Oncology

## 2021-04-17 VITALS — BP 134/76 | HR 83 | Temp 98.4°F | Resp 18

## 2021-04-17 DIAGNOSIS — C50411 Malignant neoplasm of upper-outer quadrant of right female breast: Secondary | ICD-10-CM

## 2021-04-17 DIAGNOSIS — Z170 Estrogen receptor positive status [ER+]: Secondary | ICD-10-CM

## 2021-04-17 DIAGNOSIS — C189 Malignant neoplasm of colon, unspecified: Secondary | ICD-10-CM

## 2021-04-17 DIAGNOSIS — C787 Secondary malignant neoplasm of liver and intrahepatic bile duct: Secondary | ICD-10-CM

## 2021-04-17 DIAGNOSIS — Z5111 Encounter for antineoplastic chemotherapy: Secondary | ICD-10-CM | POA: Diagnosis not present

## 2021-04-17 MED ORDER — PEGFILGRASTIM-CBQV 6 MG/0.6ML ~~LOC~~ SOSY
6.0000 mg | PREFILLED_SYRINGE | Freq: Once | SUBCUTANEOUS | Status: AC
  Administered 2021-04-17: 6 mg via SUBCUTANEOUS

## 2021-04-17 MED ORDER — HEPARIN SOD (PORK) LOCK FLUSH 100 UNIT/ML IV SOLN
500.0000 [IU] | Freq: Once | INTRAVENOUS | Status: AC | PRN
  Administered 2021-04-17: 500 [IU]

## 2021-04-17 MED ORDER — SODIUM CHLORIDE 0.9% FLUSH
10.0000 mL | INTRAVENOUS | Status: DC | PRN
  Administered 2021-04-17: 10 mL

## 2021-04-17 NOTE — Patient Instructions (Signed)

## 2021-04-23 ENCOUNTER — Encounter (HOSPITAL_BASED_OUTPATIENT_CLINIC_OR_DEPARTMENT_OTHER): Payer: Self-pay

## 2021-04-23 ENCOUNTER — Other Ambulatory Visit: Payer: Self-pay

## 2021-04-23 ENCOUNTER — Ambulatory Visit (HOSPITAL_BASED_OUTPATIENT_CLINIC_OR_DEPARTMENT_OTHER)
Admission: RE | Admit: 2021-04-23 | Discharge: 2021-04-23 | Disposition: A | Discharge status: Home / Self Care | Payer: Medicare Other | Source: Ambulatory Visit | Attending: Nurse Practitioner | Admitting: Nurse Practitioner

## 2021-04-23 ENCOUNTER — Telehealth: Payer: Self-pay

## 2021-04-23 DIAGNOSIS — I480 Paroxysmal atrial fibrillation: Secondary | ICD-10-CM

## 2021-04-23 DIAGNOSIS — C189 Malignant neoplasm of colon, unspecified: Secondary | ICD-10-CM | POA: Diagnosis present

## 2021-04-23 DIAGNOSIS — C787 Secondary malignant neoplasm of liver and intrahepatic bile duct: Secondary | ICD-10-CM | POA: Diagnosis present

## 2021-04-23 DIAGNOSIS — Z7901 Long term (current) use of anticoagulants: Secondary | ICD-10-CM

## 2021-04-23 DIAGNOSIS — Z5181 Encounter for therapeutic drug level monitoring: Secondary | ICD-10-CM

## 2021-04-23 MED ORDER — HEPARIN SOD (PORK) LOCK FLUSH 100 UNIT/ML IV SOLN
500.0000 [IU] | Freq: Once | INTRAVENOUS | Status: AC
  Administered 2021-04-23: 500 [IU] via INTRAVENOUS

## 2021-04-23 MED ORDER — IOHEXOL 300 MG/ML  SOLN
75.0000 mL | Freq: Once | INTRAMUSCULAR | Status: AC | PRN
  Administered 2021-04-23: 75 mL via INTRAVENOUS

## 2021-04-23 NOTE — Telephone Encounter (Signed)
Called pt and confirmed she is still having her INR checked at [facility]. Pt stated she goes every other week and has her INR checked with each visit. She stated her next appt is next Tuesday 04/28/21 and will have INR checked then. Explained to her that after labs are resulted, the [facility]mad[facility] Clinic will call her and confirm she is taking the correct dosage of Warfarin. Pt verbalized understanding.

## 2021-04-26 ENCOUNTER — Other Ambulatory Visit: Payer: Self-pay | Admitting: Oncology

## 2021-04-28 ENCOUNTER — Inpatient Hospital Stay (HOSPITAL_BASED_OUTPATIENT_CLINIC_OR_DEPARTMENT_OTHER): Payer: Medicare Other | Admitting: Oncology

## 2021-04-28 ENCOUNTER — Other Ambulatory Visit: Payer: Self-pay

## 2021-04-28 ENCOUNTER — Other Ambulatory Visit: Payer: Medicare Other

## 2021-04-28 ENCOUNTER — Inpatient Hospital Stay: Payer: Medicare Other

## 2021-04-28 ENCOUNTER — Encounter: Payer: Self-pay | Admitting: *Deleted

## 2021-04-28 ENCOUNTER — Ambulatory Visit: Payer: Medicare Other | Admitting: Oncology

## 2021-04-28 ENCOUNTER — Ambulatory Visit (INDEPENDENT_AMBULATORY_CARE_PROVIDER_SITE_OTHER): Payer: Medicare Other

## 2021-04-28 ENCOUNTER — Ambulatory Visit: Payer: Medicare Other

## 2021-04-28 VITALS — BP 139/81 | HR 73 | Resp 18

## 2021-04-28 VITALS — BP 148/67 | HR 87 | Temp 97.7°F | Resp 18 | Ht 70.0 in | Wt 138.4 lb

## 2021-04-28 DIAGNOSIS — C50411 Malignant neoplasm of upper-outer quadrant of right female breast: Secondary | ICD-10-CM

## 2021-04-28 DIAGNOSIS — C787 Secondary malignant neoplasm of liver and intrahepatic bile duct: Secondary | ICD-10-CM | POA: Diagnosis not present

## 2021-04-28 DIAGNOSIS — Z5181 Encounter for therapeutic drug level monitoring: Secondary | ICD-10-CM | POA: Diagnosis not present

## 2021-04-28 DIAGNOSIS — C189 Malignant neoplasm of colon, unspecified: Secondary | ICD-10-CM

## 2021-04-28 DIAGNOSIS — Z170 Estrogen receptor positive status [ER+]: Secondary | ICD-10-CM

## 2021-04-28 DIAGNOSIS — Z5111 Encounter for antineoplastic chemotherapy: Secondary | ICD-10-CM | POA: Diagnosis not present

## 2021-04-28 DIAGNOSIS — Z7901 Long term (current) use of anticoagulants: Secondary | ICD-10-CM

## 2021-04-28 DIAGNOSIS — I480 Paroxysmal atrial fibrillation: Secondary | ICD-10-CM

## 2021-04-28 LAB — CMP (CANCER CENTER ONLY)
ALT: 10 U/L (ref 0–44)
AST: 18 U/L (ref 15–41)
Albumin: 4 g/dL (ref 3.5–5.0)
Alkaline Phosphatase: 209 U/L — ABNORMAL HIGH (ref 38–126)
Anion gap: 6 (ref 5–15)
BUN: 8 mg/dL (ref 8–23)
CO2: 35 mmol/L — ABNORMAL HIGH (ref 22–32)
Calcium: 9.3 mg/dL (ref 8.9–10.3)
Chloride: 98 mmol/L (ref 98–111)
Creatinine: 0.37 mg/dL — ABNORMAL LOW (ref 0.44–1.00)
GFR, Estimated: >60 mL/min (ref >60)
Glucose, Bld: 134 mg/dL — ABNORMAL HIGH (ref 70–99)
Potassium: 3.6 mmol/L (ref 3.5–5.1)
Sodium: 139 mmol/L (ref 135–145)
Total Bilirubin: 0.3 mg/dL (ref 0.3–1.2)
Total Protein: 6.7 g/dL (ref 6.5–8.1)

## 2021-04-28 LAB — CBC WITH DIFFERENTIAL (CANCER CENTER ONLY)
Abs Immature Granulocytes: 0.08 10*3/uL — ABNORMAL HIGH (ref 0.00–0.07)
Basophils Absolute: 0 10*3/uL (ref 0.0–0.1)
Basophils Relative: 0 %
Eosinophils Absolute: 0 10*3/uL (ref 0.0–0.5)
Eosinophils Relative: 0 %
HCT: 36 % (ref 36.0–46.0)
Hemoglobin: 11.4 g/dL — ABNORMAL LOW (ref 12.0–15.0)
Immature Granulocytes: 1 %
Lymphocytes Relative: 5 %
Lymphs Abs: 0.6 10*3/uL — ABNORMAL LOW (ref 0.7–4.0)
MCH: 29.6 pg (ref 26.0–34.0)
MCHC: 31.7 g/dL (ref 30.0–36.0)
MCV: 93.5 fL (ref 80.0–100.0)
Monocytes Absolute: 0.7 10*3/uL (ref 0.1–1.0)
Monocytes Relative: 6 %
Neutro Abs: 10.5 10*3/uL — ABNORMAL HIGH (ref 1.7–7.7)
Neutrophils Relative %: 88 %
Platelet Count: 131 10*3/uL — ABNORMAL LOW (ref 150–400)
RBC: 3.85 MIL/uL — ABNORMAL LOW (ref 3.87–5.11)
RDW: 15.9 % — ABNORMAL HIGH (ref 11.5–15.5)
WBC Count: 11.9 10*3/uL — ABNORMAL HIGH (ref 4.0–10.5)
nRBC: 0 % (ref 0.0–0.2)

## 2021-04-28 LAB — PROTIME-INR
INR: 2.3 — ABNORMAL HIGH (ref 0.8–1.2)
Prothrombin Time: 24.8 seconds — ABNORMAL HIGH (ref 11.4–15.2)

## 2021-04-28 MED ORDER — PALONOSETRON HCL INJECTION 0.25 MG/5ML
0.2500 mg | Freq: Once | INTRAVENOUS | Status: AC
  Administered 2021-04-28: 0.25 mg via INTRAVENOUS
  Filled 2021-04-28: qty 5

## 2021-04-28 MED ORDER — SODIUM CHLORIDE 0.9 % IV SOLN
10.0000 mg | Freq: Once | INTRAVENOUS | Status: AC
  Administered 2021-04-28: 10 mg via INTRAVENOUS
  Filled 2021-04-28: qty 1

## 2021-04-28 MED ORDER — SODIUM CHLORIDE 0.9 % IV SOLN
400.0000 mg/m2 | Freq: Once | INTRAVENOUS | Status: AC
  Administered 2021-04-28: 668 mg via INTRAVENOUS
  Filled 2021-04-28: qty 25

## 2021-04-28 MED ORDER — SODIUM CHLORIDE 0.9 % IV SOLN: Freq: Once | INTRAVENOUS | Status: AC

## 2021-04-28 MED ORDER — FLUOROURACIL CHEMO INJECTION 2.5 GM/50ML
400.0000 mg/m2 | Freq: Once | INTRAVENOUS | Status: AC
  Administered 2021-04-28: 650 mg via INTRAVENOUS
  Filled 2021-04-28: qty 13

## 2021-04-28 MED ORDER — SODIUM CHLORIDE 0.9 % IV SOLN
2400.0000 mg/m2 | INTRAVENOUS | Status: DC
  Administered 2021-04-28: 4000 mg via INTRAVENOUS
  Filled 2021-04-28: qty 80

## 2021-04-28 MED ORDER — ATROPINE SULFATE 1 MG/ML IV SOLN
0.5000 mg | Freq: Once | INTRAVENOUS | Status: AC | PRN
  Administered 2021-04-28: 0.5 mg via INTRAVENOUS
  Filled 2021-04-28: qty 1

## 2021-04-28 MED ORDER — SODIUM CHLORIDE 0.9 % IV SOLN
150.0000 mg | Freq: Once | INTRAVENOUS | Status: AC
  Administered 2021-04-28: 150 mg via INTRAVENOUS
  Filled 2021-04-28: qty 5

## 2021-04-28 MED ORDER — SODIUM CHLORIDE 0.9 % IV SOLN
180.0000 mg/m2 | Freq: Once | INTRAVENOUS | Status: AC
  Administered 2021-04-28: 300 mg via INTRAVENOUS
  Filled 2021-04-28: qty 15

## 2021-04-28 NOTE — Progress Notes (Signed)
Patient presents for treatment. RN assessment completed along with the following:  Labs/vitals reviewed - Yes, and within treatment parameters.   Weight within 10% of previous measurement - Yes Informed consent completed and reflects current therapy/intent - Yes, on date 02/16/21             Provider progress note reviewed - Yes, today's provider note was reviewed. Treatment/Antibody/Supportive plan reviewed - Yes, and there are no adjustments needed for today's treatment. S&H and other orders reviewed - Yes, and there are no additional orders identified. Previous treatment date reviewed - Yes, and the appropriate amount of time has elapsed between treatments. Clinic Hand Off Received from - Merceda Elks, RN  Patient to proceed with treatment.

## 2021-04-28 NOTE — Progress Notes (Signed)
Patient seen by Dr. Sherrill today ? ?Vitals are within treatment parameters. ? ?Labs reviewed by Dr. Sherrill and are within treatment parameters. ? ?Per physician team, patient is ready for treatment and there are NO modifications to the treatment plan.  ?

## 2021-04-28 NOTE — Progress Notes (Signed)
[facility] OFFICE PROGRESS NOTE   Diagnosis: Colon cancer  INTERVAL HISTORY:   Ms. Franckowiak returns as scheduled.  She completed another cycle of FOLFIRI 04/15/2021.  No nausea/vomiting or mouth sores.  Mild diarrhea following chemotherapy.  No abdominal pain.  Objective:  Vital signs in last 24 hours:  Blood pressure (!) 148/67, pulse 87, temperature 97.7 F (36.5 C), temperature source Oral, resp. rate 18, height 5' 10"  (1.778 m), weight 138 lb 6.4 oz (62.8 kg), SpO2 99 %.    HEENT: No thrush or ulcers Resp: Distant breath sounds, scattered inspiratory rhonchi, no respiratory distress Cardio: Regular rate and rhythm GI: No hepatosplenomegaly, nontender, no mass Vascular: No leg edema    Portacath/PICC-without erythema  Lab Results:  Lab Results  Component Value Date   WBC 11.9 (H) 04/28/2021   HGB 11.4 (L) 04/28/2021   HCT 36.0 04/28/2021   MCV 93.5 04/28/2021   PLT 131 (L) 04/28/2021   NEUTROABS 10.5 (H) 04/28/2021    CMP  Lab Results  Component Value Date   NA 139 04/28/2021   K 3.6 04/28/2021   CL 98 04/28/2021   CO2 35 (H) 04/28/2021   GLUCOSE 134 (H) 04/28/2021   BUN 8 04/28/2021   CREATININE 0.37 (L) 04/28/2021   CALCIUM 9.3 04/28/2021   PROT 6.7 04/28/2021   ALBUMIN 4.0 04/28/2021   AST 18 04/28/2021   ALT 10 04/28/2021   ALKPHOS 209 (H) 04/28/2021   BILITOT 0.3 04/28/2021   GFRNONAA >60 04/28/2021   GFRAA >60 08/17/2019    Lab Results  Component Value Date   CEA1 1.40 12/16/2020   CEA 1.57 02/16/2021    Lab Results  Component Value Date   INR 2.3 (H) 04/28/2021   LABPROT 24.8 (H) 04/28/2021    Medications: I have reviewed the patient's current medications.   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, lymphovascular invasion present including large vessel extramural invasion, loss of MLH1 and PMS2,  MLH1 hyper methylation present Incomplete colonoscopy secondary to stricturing at the sigmoid colon Virtual colonoscopy 03/13/2020- mass centered at the medial wall of the cecum and terminal ileum, adjacent ileocolic mesenteric adenopathy, nonspecific 3 mm right lower lobe nodule PET 19/37/9024 hypermetabolic cecal/terminal ileum mass with adjacent enlarged and hypermetabolic ileocolic lymph nodes, previously noted segment 7 liver lesion-not hypermetabolic- benign etiology favored, dominant left lower lobe pulmonary nodule not hypermetabolic, other tiny nodules not hypermetabolic and below PET sensitivity Cycle 1 FOLFOX 05/12/2020 Cycle 2 FOLFOX 05/26/2020 Cycle 3 FOLFOX 06/09/2020 Cycle 4 FOLFOX 06/23/2020 Cycle 5 FOLFOX 07/07/2020 Cycle 12 FOLFOX 10/14/2020, oxaliplatin dose reduced beginning with cycle 9 CT 12/04/2020-development of peripheral enhancing hypoattenuating mass in segment 5/8, necrotic porta hepatis lymphadenopathy, similar appearance of previously visualized nonhypermetabolic subcapsular segment 7 lesion Ultrasound-guided biopsy right liver lesion 12/22/2020-adenocarcinoma consistent with a colon primary PET scan 12/30/2020-hypermetabolic hepatic lesion and portal adenopathy-increased in size compared to 12/04/2020 CT, small hypermetabolic left periaortic node Foundation 1-MSI high, tumor mutation burden 31, K-ras wild-type Cycle 1 Pembrolizumab 01/15/2021 Cycle 1 FOLFIRI 02/16/2021 Cycle 2 FOLFIRI 03/02/2021-Emend and Udenyca added Cycle 3 FOLFIRI 03/17/2021 Cycle 4 FOLFIRI 03/31/2021 Cycle 5 FOLFIRI 04/15/2021 CT abdomen/pelvis 04/23/2021-mild decrease in size of right hepatic lobe metastases, mild decrease in porta hepatis and portacaval lymphadenopathy, mild increase in left periaortic lymphadenopathy Cycle 6 FOLFIRI 04/28/2021 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 Randleman appears stable.  She continues to tolerate the FOLFIRI well.  I reviewed the restaging CT findings and images with her.  The overall pattern is consistent with a continued response to FOLFIRI.  The plan is to continue FOLFIRI.  She will be referred for a restaging CT after 4 more cycles.  The plan is to consider a repeat trial of pembrolizumab if she has stable or progressive disease on the next cycle of chemotherapy.  It is possible the severe pain she experienced following cycle 1 pembrolizumab was secondary to treatment and the subsequent response systemic therapy may have been from the 1 dose of pembrolizumab.  Ms. Deal continues Coumadin anticoagulation.  The PT/INR is therapeutic today.  She will return for an office visit and FOLFIRI in 2 weeks.  Betsy Coder, MD  04/28/2021  11:56 AM

## 2021-04-28 NOTE — Patient Instructions (Signed)
[place]  The chemotherapy medication bag should finish at 46 hours, 96 hours, or 7 days. For example, if your pump is scheduled for 46 hours and it was put on at 4:00 p.m., it should finish at 2:00 p.m. the day it is scheduled to come off regardless of your appointment time.     Estimated time to finish at  11:45 Thursday, April 30, 2021.   If the display on your pump reads "Low Volume" and it is beeping, take the batteries out of the pump and come to the cancer center for it to be taken off.   If the pump alarms go off prior to the pump reading "Low Volume" then call [phone] and someone can assist you.  If the plunger comes out and the chemotherapy medication is leaking out, please use your home chemo spill kit to clean up the spill. Do NOT use paper towels or other household products.  If you have problems or questions regarding your pump, please call either [phone] (24 hours a day) or the cancer center Monday-Friday 8:00 a.m.- 4:30 p.m. at the clinic number and we will assist you. If you are unable to get assistance, then go to the nearest Emergency Department and ask the staff to contact the IV team for assistance.   Discharge Instructions: Thank you for choosing [facility] to provide your oncology and hematology care.   If you have a lab appointment with the [facility], please go directly to the [facility] and check in at the registration area.   Wear comfortable clothing and clothing appropriate for easy access to any Portacath or PICC line.   We strive to give you quality time with your provider. You may need to reschedule your appointment if you arrive late (15 or more minutes).  Arriving late affects you and other patients whose appointments are after yours.  Also, if you miss three or more appointments without notifying the office, you may be dismissed from the clinic at the providers discretion.      For  prescription refill requests, have your pharmacy contact our office and allow 72 hours for refills to be completed.    Today you received the following chemotherapy and/or immunotherapy agents Irinotecan, leucovorin, fluorouracil      To help prevent nausea and vomiting after your treatment, we encourage you to take your nausea medication as directed.  BELOW ARE SYMPTOMS THAT SHOULD BE REPORTED IMMEDIATELY: *FEVER GREATER THAN 100.4 F (38 C) OR HIGHER *CHILLS OR SWEATING *NAUSEA AND VOMITING THAT IS NOT CONTROLLED WITH YOUR NAUSEA MEDICATION *UNUSUAL SHORTNESS OF BREATH *UNUSUAL BRUISING OR BLEEDING *URINARY PROBLEMS (pain or burning when urinating, or frequent urination) *BOWEL PROBLEMS (unusual diarrhea, constipation, pain near the anus) TENDERNESS IN MOUTH AND THROAT WITH OR WITHOUT PRESENCE OF ULCERS (sore throat, sores in mouth, or a toothache) UNUSUAL RASH, SWELLING OR PAIN  UNUSUAL VAGINAL DISCHARGE OR ITCHING   Items with * indicate a potential emergency and should be followed up as soon as possible or go to the Emergency Department if any problems should occur.  Please show the CHEMOTHERAPY ALERT CARD or IMMUNOTHERAPY ALERT CARD at check-in to the Emergency Department and triage nurse.  Should you have questions after your visit or need to cancel or reschedule your appointment, please contact [facility]  Dept: [phone]  and follow the prompts.  Office hours are 8:00 a.m. to 4:30 p.m. Monday - Friday. Please note that  voicemails left after 4:00 p.m. may not be returned until the following business day.  We are closed weekends and major holidays. You have access to a nurse at all times for urgent questions. Please call the main number to the clinic Dept: [phone] and follow the prompts.   For any non-urgent questions, you may also contact your provider using MyChart. We now offer e-Visits for anyone 9 and older to request care online for  non-urgent symptoms. For details visit [email or web address].   Also download the MyChart app! Go to the app store, search "MyChart", open the app, select [facility], and log in with your MyChart username and password.  Due to Covid, a mask is required upon entering the hospital/clinic. If you do not have a mask, one will be given to you upon arrival. For doctor visits, patients may have 1 support person aged 77 or older with them. For treatment visits, patients cannot have anyone with them due to cur Irinotecan injection What is this medication? IRINOTECAN (ir in oh TEE kan ) is a chemotherapy drug. It is used to treat colon and rectal cancer. This medicine may be used for other purposes; ask your health care provider or pharmacist if you have questions. COMMON BRAND NAME(S): Camptosar What should I tell my care team before I take this medication? They need to know if you have any of these conditions: dehydration diarrhea infection (especially a virus infection such as chickenpox, cold sores, or herpes) liver disease low blood counts, like low white cell, platelet, or red cell counts low levels of calcium, magnesium, or potassium in the blood recent or ongoing radiation therapy an unusual or allergic reaction to irinotecan, other medicines, foods, dyes, or preservatives pregnant or trying to get pregnant breast-feeding How should I use this medication? This drug is given as an infusion into a vein. It is administered in a hospital or clinic by a specially trained health care professional. Talk to your pediatrician regarding the use of this medicine in children. Special care may be needed. Overdosage: If you think you have taken too much of this medicine contact a poison control center or emergency room at once. NOTE: This medicine is only for you. Do not share this medicine with others. What if I miss a dose? It is important not to miss your dose. Call your doctor or health care  professional if you are unable to keep an appointment. What may interact with this medication? Do not take this medicine with any of the following medications: cobicistat itraconazole This medicine may interact with the following medications: antiviral medicines for HIV or AIDS certain antibiotics like rifampin or rifabutin certain medicines for fungal infections like ketoconazole, posaconazole, and voriconazole certain medicines for seizures like carbamazepine, phenobarbital, phenotoin clarithromycin gemfibrozil nefazodone [facility]. John's Wort This list may not describe all possible interactions. Give your health care provider a list of all the medicines, herbs, non-prescription drugs, or dietary supplements you use. Also tell them if you smoke, drink alcohol, or use illegal drugs. Some items may interact with your medicine. What should I watch for while using this medication? Your condition will be monitored carefully while you are receiving this medicine. You will need important blood work done while you are taking this medicine. This drug may make you feel generally unwell. This is not uncommon, as chemotherapy can affect healthy cells as well as cancer cells. Report any side effects. Continue your course of treatment even though you feel ill unless your doctor tells you  to stop. In some cases, you may be given additional medicines to help with side effects. Follow all directions for their use. You may get drowsy or dizzy. Do not drive, use machinery, or do anything that needs mental alertness until you know how this medicine affects you. Do not stand or sit up quickly, especially if you are an older patient. This reduces the risk of dizzy or fainting spells. Call your health care professional for advice if you get a fever, chills, or sore throat, or other symptoms of a cold or flu. Do not treat yourself. This medicine decreases your body's ability to fight infections. Try to avoid being around  people who are sick. Avoid taking products that contain aspirin, acetaminophen, ibuprofen, naproxen, or ketoprofen unless instructed by your doctor. These medicines may hide a fever. This medicine may increase your risk to bruise or bleed. Call your doctor or health care professional if you notice any unusual bleeding. Be careful brushing and flossing your teeth or using a toothpick because you may get an infection or bleed more easily. If you have any dental work done, tell your dentist you are receiving this medicine. Do not become pregnant while taking this medicine or for 6 months after stopping it. Women should inform their health care professional if they wish to become pregnant or think they might be pregnant. Men should not father a child while taking this medicine and for 3 months after stopping it. There is potential for serious side effects to an unborn child. Talk to your health care professional for more information. Do not breast-feed an infant while taking this medicine or for 7 days after stopping it. This medicine has caused ovarian failure in some women. This medicine may make it more difficult to get pregnant. Talk to your health care professional if you are concerned about your fertility. This medicine has caused decreased sperm counts in some men. This may make it more difficult to father a child. Talk to your health care professional if you are concerned about your fertility. What side effects may I notice from receiving this medication? Side effects that you should report to your doctor or health care professional as soon as possible: allergic reactions like skin rash, itching or hives, swelling of the face, lips, or tongue chest pain diarrhea flushing, runny nose, sweating during infusion low blood counts - this medicine may decrease the number of white blood cells, red blood cells and platelets. You may be at increased risk for infections and bleeding. nausea, vomiting pain,  swelling, warmth in the leg signs of decreased platelets or bleeding - bruising, pinpoint red spots on the skin, black, tarry stools, blood in the urine signs of infection - fever or chills, cough, sore throat, pain or difficulty passing urine signs of decreased red blood cells - unusually weak or tired, fainting spells, lightheadedness Side effects that usually do not require medical attention (report to your doctor or health care professional if they continue or are bothersome): constipation hair loss headache loss of appetite mouth sores stomach pain This list may not describe all possible side effects. Call your doctor for medical advice about side effects. You may report side effects to FDA at 1-800-FDA-1088. Where should I keep my medication? This drug is given in a hospital or clinic and will not be stored at home. NOTE: This sheet is a summary. It may not cover all possible information. If you have questions about this medicine, talk to your doctor, pharmacist, or health  care provider.  2022 Elsevier/Gold Standard (2020-12-16 00:00:00)  Leucovorin injection What is this medication? LEUCOVORIN (loo koe VOR in) is used to prevent or treat the harmful effects of some medicines. This medicine is used to treat anemia caused by a low amount of folic acid in the body. It is also used with 5-fluorouracil (5-FU) to treat colon cancer. This medicine may be used for other purposes; ask your health care provider or pharmacist if you have questions. What should I tell my care team before I take this medication? They need to know if you have any of these conditions: anemia from low levels of vitamin B-12 in the blood an unusual or allergic reaction to leucovorin, folic acid, other medicines, foods, dyes, or preservatives pregnant or trying to get pregnant breast-feeding How should I use this medication? This medicine is for injection into a muscle or into a vein. It is given by a health care  professional in a hospital or clinic setting. Talk to your pediatrician regarding the use of this medicine in children. Special care may be needed. Overdosage: If you think you have taken too much of this medicine contact a poison control center or emergency room at once. NOTE: This medicine is only for you. Do not share this medicine with others. What if I miss a dose? This does not apply. What may interact with this medication? capecitabine fluorouracil phenobarbital phenytoin primidone trimethoprim-sulfamethoxazole This list may not describe all possible interactions. Give your health care provider a list of all the medicines, herbs, non-prescription drugs, or dietary supplements you use. Also tell them if you smoke, drink alcohol, or use illegal drugs. Some items may interact with your medicine. What should I watch for while using this medication? Your condition will be monitored carefully while you are receiving this medicine. This medicine may increase the side effects of 5-fluorouracil, 5-FU. Tell your doctor or health care professional if you have diarrhea or mouth sores that do not get better or that get worse. What side effects may I notice from receiving this medication? Side effects that you should report to your doctor or health care professional as soon as possible: allergic reactions like skin rash, itching or hives, swelling of the face, lips, or tongue breathing problems fever, infection mouth sores unusual bleeding or bruising unusually weak or tired Side effects that usually do not require medical attention (report to your doctor or health care professional if they continue or are bothersome): constipation or diarrhea loss of appetite nausea, vomiting This list may not describe all possible side effects. Call your doctor for medical advice about side effects. You may report side effects to FDA at 1-800-FDA-1088. Where should I keep my medication? This drug is given  in a hospital or clinic and will not be stored at home. NOTE: This sheet is a summary. It may not cover all possible information. If you have questions about this medicine, talk to your doctor, pharmacist, or health care provider.  2022 Elsevier/Gold Standard (2007-10-05 00:00:00)  Fluorouracil, 5-FU injection What is this medication? FLUOROURACIL, 5-FU (flure oh YOOR a sil) is a chemotherapy drug. It slows the growth of cancer cells. This medicine is used to treat many types of cancer like breast cancer, colon or rectal cancer, pancreatic cancer, and stomach cancer. This medicine may be used for other purposes; ask your health care provider or pharmacist if you have questions. COMMON BRAND NAME(S): Adrucil What should I tell my care team before I take this medication? They need  to know if you have any of these conditions: blood disorders dihydropyrimidine dehydrogenase (DPD) deficiency infection (especially a virus infection such as chickenpox, cold sores, or herpes) kidney disease liver disease malnourished, poor nutrition recent or ongoing radiation therapy an unusual or allergic reaction to fluorouracil, other chemotherapy, other medicines, foods, dyes, or preservatives pregnant or trying to get pregnant breast-feeding How should I use this medication? This drug is given as an infusion or injection into a vein. It is administered in a hospital or clinic by a specially trained health care professional. Talk to your pediatrician regarding the use of this medicine in children. Special care may be needed. Overdosage: If you think you have taken too much of this medicine contact a poison control center or emergency room at once. NOTE: This medicine is only for you. Do not share this medicine with others. What if I miss a dose? It is important not to miss your dose. Call your doctor or health care professional if you are unable to keep an appointment. What may interact with this  medication? Do not take this medicine with any of the following medications: live virus vaccines This medicine may also interact with the following medications: medicines that treat or prevent blood clots like warfarin, enoxaparin, and dalteparin This list may not describe all possible interactions. Give your health care provider a list of all the medicines, herbs, non-prescription drugs, or dietary supplements you use. Also tell them if you smoke, drink alcohol, or use illegal drugs. Some items may interact with your medicine. What should I watch for while using this medication? Visit your doctor for checks on your progress. This drug may make you feel generally unwell. This is not uncommon, as chemotherapy can affect healthy cells as well as cancer cells. Report any side effects. Continue your course of treatment even though you feel ill unless your doctor tells you to stop. In some cases, you may be given additional medicines to help with side effects. Follow all directions for their use. Call your doctor or health care professional for advice if you get a fever, chills or sore throat, or other symptoms of a cold or flu. Do not treat yourself. This drug decreases your body's ability to fight infections. Try to avoid being around people who are sick. This medicine may increase your risk to bruise or bleed. Call your doctor or health care professional if you notice any unusual bleeding. Be careful brushing and flossing your teeth or using a toothpick because you may get an infection or bleed more easily. If you have any dental work done, tell your dentist you are receiving this medicine. Avoid taking products that contain aspirin, acetaminophen, ibuprofen, naproxen, or ketoprofen unless instructed by your doctor. These medicines may hide a fever. Do not become pregnant while taking this medicine. Women should inform their doctor if they wish to become pregnant or think they might be pregnant. There is  a potential for serious side effects to an unborn child. Talk to your health care professional or pharmacist for more information. Do not breast-feed an infant while taking this medicine. Men should inform their doctor if they wish to father a child. This medicine may lower sperm counts. Do not treat diarrhea with over the counter products. Contact your doctor if you have diarrhea that lasts more than 2 days or if it is severe and watery. This medicine can make you more sensitive to the sun. Keep out of the sun. If you cannot avoid being in  the sun, wear protective clothing and use sunscreen. Do not use sun lamps or tanning beds/booths. What side effects may I notice from receiving this medication? Side effects that you should report to your doctor or health care professional as soon as possible: allergic reactions like skin rash, itching or hives, swelling of the face, lips, or tongue low blood counts - this medicine may decrease the number of white blood cells, red blood cells and platelets. You may be at increased risk for infections and bleeding. signs of infection - fever or chills, cough, sore throat, pain or difficulty passing urine signs of decreased platelets or bleeding - bruising, pinpoint red spots on the skin, black, tarry stools, blood in the urine signs of decreased red blood cells - unusually weak or tired, fainting spells, lightheadedness breathing problems changes in vision chest pain mouth sores nausea and vomiting pain, swelling, redness at site where injected pain, tingling, numbness in the hands or feet redness, swelling, or sores on hands or feet stomach pain unusual bleeding Side effects that usually do not require medical attention (report to your doctor or health care professional if they continue or are bothersome): changes in finger or toe nails diarrhea dry or itchy skin hair loss headache loss of appetite sensitivity of eyes to the light stomach  upset unusually teary eyes This list may not describe all possible side effects. Call your doctor for medical advice about side effects. You may report side effects to FDA at 1-800-FDA-1088. Where should I keep my medication? This drug is given in a hospital or clinic and will not be stored at home. NOTE: This sheet is a summary. It may not cover all possible information. If you have questions about this medicine, talk to your doctor, pharmacist, or health care provider.  2022 Elsevier/Gold Standard (2020-12-16 00:00:00)

## 2021-04-29 NOTE — Patient Instructions (Signed)
Description   Called and spoke with pt. Instructed to continue 10mg  daily except 5mg  on Mondays, Wednesdays and Saturdays.  Recheck INR 2 weeks at [facility]. Coumadin Clinic# [phone].  **PT/INR drawn at [facility]**

## 2021-04-30 ENCOUNTER — Other Ambulatory Visit: Payer: Self-pay

## 2021-04-30 ENCOUNTER — Inpatient Hospital Stay: Payer: Medicare Other

## 2021-04-30 VITALS — BP 144/73 | HR 76 | Temp 98.8°F | Resp 18

## 2021-04-30 DIAGNOSIS — C50411 Malignant neoplasm of upper-outer quadrant of right female breast: Secondary | ICD-10-CM

## 2021-04-30 DIAGNOSIS — Z170 Estrogen receptor positive status [ER+]: Secondary | ICD-10-CM

## 2021-04-30 DIAGNOSIS — C787 Secondary malignant neoplasm of liver and intrahepatic bile duct: Secondary | ICD-10-CM

## 2021-04-30 DIAGNOSIS — C189 Malignant neoplasm of colon, unspecified: Secondary | ICD-10-CM

## 2021-04-30 DIAGNOSIS — Z5111 Encounter for antineoplastic chemotherapy: Secondary | ICD-10-CM | POA: Diagnosis not present

## 2021-04-30 MED ORDER — HEPARIN SOD (PORK) LOCK FLUSH 100 UNIT/ML IV SOLN
500.0000 [IU] | Freq: Once | INTRAVENOUS | Status: AC | PRN
  Administered 2021-04-30: 500 [IU]

## 2021-04-30 MED ORDER — SODIUM CHLORIDE 0.9% FLUSH
10.0000 mL | INTRAVENOUS | Status: DC | PRN
  Administered 2021-04-30: 10 mL

## 2021-04-30 MED ORDER — PEGFILGRASTIM-CBQV 6 MG/0.6ML ~~LOC~~ SOSY
6.0000 mg | PREFILLED_SYRINGE | Freq: Once | SUBCUTANEOUS | Status: AC
  Administered 2021-04-30: 6 mg via SUBCUTANEOUS
  Filled 2021-04-30: qty 0.6

## 2021-04-30 NOTE — Patient Instructions (Signed)

## 2021-05-10 ENCOUNTER — Other Ambulatory Visit: Payer: Self-pay | Admitting: Oncology

## 2021-05-12 ENCOUNTER — Ambulatory Visit (INDEPENDENT_AMBULATORY_CARE_PROVIDER_SITE_OTHER): Payer: Medicare Other

## 2021-05-12 ENCOUNTER — Other Ambulatory Visit: Payer: Self-pay

## 2021-05-12 ENCOUNTER — Inpatient Hospital Stay (HOSPITAL_BASED_OUTPATIENT_CLINIC_OR_DEPARTMENT_OTHER): Payer: Medicare Other | Admitting: Oncology

## 2021-05-12 ENCOUNTER — Inpatient Hospital Stay: Payer: Medicare Other

## 2021-05-12 ENCOUNTER — Encounter: Payer: Self-pay | Admitting: *Deleted

## 2021-05-12 VITALS — BP 129/78 | HR 75

## 2021-05-12 VITALS — BP 139/75 | HR 82 | Temp 98.7°F | Resp 20 | Ht 70.0 in | Wt 136.0 lb

## 2021-05-12 DIAGNOSIS — Z5181 Encounter for therapeutic drug level monitoring: Secondary | ICD-10-CM

## 2021-05-12 DIAGNOSIS — I480 Paroxysmal atrial fibrillation: Secondary | ICD-10-CM

## 2021-05-12 DIAGNOSIS — C787 Secondary malignant neoplasm of liver and intrahepatic bile duct: Secondary | ICD-10-CM

## 2021-05-12 DIAGNOSIS — C50411 Malignant neoplasm of upper-outer quadrant of right female breast: Secondary | ICD-10-CM

## 2021-05-12 DIAGNOSIS — C189 Malignant neoplasm of colon, unspecified: Secondary | ICD-10-CM

## 2021-05-12 DIAGNOSIS — Z7901 Long term (current) use of anticoagulants: Secondary | ICD-10-CM

## 2021-05-12 DIAGNOSIS — Z5111 Encounter for antineoplastic chemotherapy: Secondary | ICD-10-CM | POA: Diagnosis not present

## 2021-05-12 LAB — CBC WITH DIFFERENTIAL (CANCER CENTER ONLY)
Abs Immature Granulocytes: 0.07 10*3/uL (ref 0.00–0.07)
Basophils Absolute: 0.1 10*3/uL (ref 0.0–0.1)
Basophils Relative: 1 %
Eosinophils Absolute: 0 10*3/uL (ref 0.0–0.5)
Eosinophils Relative: 0 %
HCT: 39 % (ref 36.0–46.0)
Hemoglobin: 11.9 g/dL — ABNORMAL LOW (ref 12.0–15.0)
Immature Granulocytes: 1 %
Lymphocytes Relative: 6 %
Lymphs Abs: 0.6 10*3/uL — ABNORMAL LOW (ref 0.7–4.0)
MCH: 29.1 pg (ref 26.0–34.0)
MCHC: 30.5 g/dL (ref 30.0–36.0)
MCV: 95.4 fL (ref 80.0–100.0)
Monocytes Absolute: 0.7 10*3/uL (ref 0.1–1.0)
Monocytes Relative: 7 %
Neutro Abs: 7.9 10*3/uL — ABNORMAL HIGH (ref 1.7–7.7)
Neutrophils Relative %: 85 %
Platelet Count: 101 10*3/uL — ABNORMAL LOW (ref 150–400)
RBC: 4.09 MIL/uL (ref 3.87–5.11)
RDW: 16.6 % — ABNORMAL HIGH (ref 11.5–15.5)
WBC Count: 9.2 10*3/uL (ref 4.0–10.5)
nRBC: 0 % (ref 0.0–0.2)

## 2021-05-12 LAB — CMP (CANCER CENTER ONLY)
ALT: 13 U/L (ref 0–44)
AST: 22 U/L (ref 15–41)
Albumin: 3.9 g/dL (ref 3.5–5.0)
Alkaline Phosphatase: 194 U/L — ABNORMAL HIGH (ref 38–126)
Anion gap: 7 (ref 5–15)
BUN: 9 mg/dL (ref 8–23)
CO2: 30 mmol/L (ref 22–32)
Calcium: 9.2 mg/dL (ref 8.9–10.3)
Chloride: 103 mmol/L (ref 98–111)
Creatinine: 0.45 mg/dL (ref 0.44–1.00)
GFR, Estimated: >60 mL/min (ref >60)
Glucose, Bld: 159 mg/dL — ABNORMAL HIGH (ref 70–99)
Potassium: 3.8 mmol/L (ref 3.5–5.1)
Sodium: 140 mmol/L (ref 135–145)
Total Bilirubin: 0.4 mg/dL (ref 0.3–1.2)
Total Protein: 6.7 g/dL (ref 6.5–8.1)

## 2021-05-12 LAB — PROTIME-INR
INR: 2.4 — ABNORMAL HIGH (ref 0.8–1.2)
Prothrombin Time: 26.2 seconds — ABNORMAL HIGH (ref 11.4–15.2)

## 2021-05-12 MED ORDER — SODIUM CHLORIDE 0.9 % IV SOLN
400.0000 mg/m2 | Freq: Once | INTRAVENOUS | Status: AC
  Administered 2021-05-12: 668 mg via INTRAVENOUS
  Filled 2021-05-12: qty 33.4

## 2021-05-12 MED ORDER — PALONOSETRON HCL INJECTION 0.25 MG/5ML
0.2500 mg | Freq: Once | INTRAVENOUS | Status: AC
  Administered 2021-05-12: 0.25 mg via INTRAVENOUS
  Filled 2021-05-12: qty 5

## 2021-05-12 MED ORDER — SODIUM CHLORIDE 0.9 % IV SOLN
2400.0000 mg/m2 | INTRAVENOUS | Status: DC
  Administered 2021-05-12: 4000 mg via INTRAVENOUS
  Filled 2021-05-12: qty 80

## 2021-05-12 MED ORDER — FLUOROURACIL CHEMO INJECTION 2.5 GM/50ML
400.0000 mg/m2 | Freq: Once | INTRAVENOUS | Status: AC
  Administered 2021-05-12: 650 mg via INTRAVENOUS
  Filled 2021-05-12: qty 13

## 2021-05-12 MED ORDER — SODIUM CHLORIDE 0.9 % IV SOLN
150.0000 mg | Freq: Once | INTRAVENOUS | Status: AC
  Administered 2021-05-12: 150 mg via INTRAVENOUS
  Filled 2021-05-12: qty 5

## 2021-05-12 MED ORDER — SODIUM CHLORIDE 0.9 % IV SOLN
10.0000 mg | Freq: Once | INTRAVENOUS | Status: AC
  Administered 2021-05-12: 10 mg via INTRAVENOUS
  Filled 2021-05-12: qty 1

## 2021-05-12 MED ORDER — SODIUM CHLORIDE 0.9 % IV SOLN: Freq: Once | INTRAVENOUS | Status: AC

## 2021-05-12 MED ORDER — SODIUM CHLORIDE 0.9 % IV SOLN
180.0000 mg/m2 | Freq: Once | INTRAVENOUS | Status: AC
  Administered 2021-05-12: 300 mg via INTRAVENOUS
  Filled 2021-05-12: qty 15

## 2021-05-12 MED ORDER — ATROPINE SULFATE 1 MG/ML IV SOLN
0.5000 mg | Freq: Once | INTRAVENOUS | Status: AC | PRN
  Administered 2021-05-12: 0.5 mg via INTRAVENOUS
  Filled 2021-05-12: qty 1

## 2021-05-12 NOTE — Progress Notes (Signed)
Patient presents for treatment. RN assessment completed along with the following:  Labs/vitals reviewed - Yes, and within treatment parameters.   Weight within 10% of previous measurement - Yes Informed consent completed and reflects current therapy/intent - Yes, on date 02/16/21             Provider progress note reviewed - Today's provider note is not yet available. I reviewed the most recent oncology provider progress note in chart dated 04/28/21. Treatment/Antibody/Supportive plan reviewed - Yes, and there are no adjustments needed for today's treatment. S&H and other orders reviewed - Yes, and there are no additional orders identified. Previous treatment date reviewed - Yes, and the appropriate amount of time has elapsed between treatments. Clinic Hand Off Received from - [facility]   Patient to proceed with treatment.

## 2021-05-12 NOTE — Patient Instructions (Signed)
[facility] The chemotherapy medication bag should finish at 46 hours, 96 hours, or 7 days. For example, if your pump is scheduled for 46 hours and it was put on at 4:00 p.m., it should finish at 2:00 p.m. the day it is scheduled to come off regardless of your appointment time.     Estimated time to finish at 1:15 Thursday, May 14, 2021.   If the display on your pump reads "Low Volume" and it is beeping, take the batteries out of the pump and come to the cancer center for it to be taken off.   If the pump alarms go off prior to the pump reading "Low Volume" then call [phone] and someone can assist you.  If the plunger comes out and the chemotherapy medication is leaking out, please use your home chemo spill kit to clean up the spill. Do NOT use paper towels or other household products.  If you have problems or questions regarding your pump, please call either [phone] (24 hours a day) or the cancer center Monday-Friday 8:00 a.m.- 4:30 p.m. at the clinic number and we will assist you. If you are unable to get assistance, then go to the nearest Emergency Department and ask the staff to contact the IV team for assistance.    Discharge Instructions: Thank you for choosing [facility] to provide your oncology and hematology care.   If you have a lab appointment with the [facility], please go directly to the [facility] and check in at the registration area.   Wear comfortable clothing and clothing appropriate for easy access to any Portacath or PICC line.   We strive to give you quality time with your provider. You may need to reschedule your appointment if you arrive late (15 or more minutes).  Arriving late affects you and other patients whose appointments are after yours.  Also, if you miss three or more appointments without notifying the office, you may be dismissed from the clinic at the providers discretion.      For prescription  refill requests, have your pharmacy contact our office and allow 72 hours for refills to be completed.    Today you received the following chemotherapy and/or immunotherapy agents irinotecan, leucovorin, fluorouracil      To help prevent nausea and vomiting after your treatment, we encourage you to take your nausea medication as directed.  BELOW ARE SYMPTOMS THAT SHOULD BE REPORTED IMMEDIATELY: *FEVER GREATER THAN 100.4 F (38 C) OR HIGHER *CHILLS OR SWEATING *NAUSEA AND VOMITING THAT IS NOT CONTROLLED WITH YOUR NAUSEA MEDICATION *UNUSUAL SHORTNESS OF BREATH *UNUSUAL BRUISING OR BLEEDING *URINARY PROBLEMS (pain or burning when urinating, or frequent urination) *BOWEL PROBLEMS (unusual diarrhea, constipation, pain near the anus) TENDERNESS IN MOUTH AND THROAT WITH OR WITHOUT PRESENCE OF ULCERS (sore throat, sores in mouth, or a toothache) UNUSUAL RASH, SWELLING OR PAIN  UNUSUAL VAGINAL DISCHARGE OR ITCHING   Items with * indicate a potential emergency and should be followed up as soon as possible or go to the Emergency Department if any problems should occur.  Please show the CHEMOTHERAPY ALERT CARD or IMMUNOTHERAPY ALERT CARD at check-in to the Emergency Department and triage nurse.  Should you have questions after your visit or need to cancel or reschedule your appointment, please contact [facility]  Dept: [phone]  and follow the prompts.  Office hours are 8:00 a.m. to 4:30 p.m. Monday - Friday. Please note that voicemails  left after 4:00 p.m. may not be returned until the following business day.  We are closed weekends and major holidays. You have access to a nurse at all times for urgent questions. Please call the main number to the clinic Dept: [phone] and follow the prompts.   For any non-urgent questions, you may also contact your provider using MyChart. We now offer e-Visits for anyone 61 and older to request care online for non-urgent symptoms.  For details visit [email or web address].   Also download the MyChart app! Go to the app store, search "MyChart", open the app, select [facility], and log in with your MyChart username and password.  Due to Covid, a mask is required upon entering the hospital/clinic. If you do not have a mask, one will be given to you upon arrival. For doctor visits, patients may have 1 support person aged 69 or older with them. For treatment visits, patients cannot have anyone with them due to current Covid guidelines and our immunocompromised population.   Irinotecan injection What is this medication? IRINOTECAN (ir in oh TEE kan ) is a chemotherapy drug. It is used to treat colon and rectal cancer. This medicine may be used for other purposes; ask your health care provider or pharmacist if you have questions. COMMON BRAND NAME(S): Camptosar What should I tell my care team before I take this medication? They need to know if you have any of these conditions: dehydration diarrhea infection (especially a virus infection such as chickenpox, cold sores, or herpes) liver disease low blood counts, like low white cell, platelet, or red cell counts low levels of calcium, magnesium, or potassium in the blood recent or ongoing radiation therapy an unusual or allergic reaction to irinotecan, other medicines, foods, dyes, or preservatives pregnant or trying to get pregnant breast-feeding How should I use this medication? This drug is given as an infusion into a vein. It is administered in a hospital or clinic by a specially trained health care professional. Talk to your pediatrician regarding the use of this medicine in children. Special care may be needed. Overdosage: If you think you have taken too much of this medicine contact a poison control center or emergency room at once. NOTE: This medicine is only for you. Do not share this medicine with others. What if I miss a dose? It is important not to miss your  dose. Call your doctor or health care professional if you are unable to keep an appointment. What may interact with this medication? Do not take this medicine with any of the following medications: cobicistat itraconazole This medicine may interact with the following medications: antiviral medicines for HIV or AIDS certain antibiotics like rifampin or rifabutin certain medicines for fungal infections like ketoconazole, posaconazole, and voriconazole certain medicines for seizures like carbamazepine, phenobarbital, phenotoin clarithromycin gemfibrozil nefazodone [facility]. John's Wort This list may not describe all possible interactions. Give your health care provider a list of all the medicines, herbs, non-prescription drugs, or dietary supplements you use. Also tell them if you smoke, drink alcohol, or use illegal drugs. Some items may interact with your medicine. What should I watch for while using this medication? Your condition will be monitored carefully while you are receiving this medicine. You will need important blood work done while you are taking this medicine. This drug may make you feel generally unwell. This is not uncommon, as chemotherapy can affect healthy cells as well as cancer cells. Report any side effects. Continue your course of treatment even though you  feel ill unless your doctor tells you to stop. In some cases, you may be given additional medicines to help with side effects. Follow all directions for their use. You may get drowsy or dizzy. Do not drive, use machinery, or do anything that needs mental alertness until you know how this medicine affects you. Do not stand or sit up quickly, especially if you are an older patient. This reduces the risk of dizzy or fainting spells. Call your health care professional for advice if you get a fever, chills, or sore throat, or other symptoms of a cold or flu. Do not treat yourself. This medicine decreases your body's ability to fight  infections. Try to avoid being around people who are sick. Avoid taking products that contain aspirin, acetaminophen, ibuprofen, naproxen, or ketoprofen unless instructed by your doctor. These medicines may hide a fever. This medicine may increase your risk to bruise or bleed. Call your doctor or health care professional if you notice any unusual bleeding. Be careful brushing and flossing your teeth or using a toothpick because you may get an infection or bleed more easily. If you have any dental work done, tell your dentist you are receiving this medicine. Do not become pregnant while taking this medicine or for 6 months after stopping it. Women should inform their health care professional if they wish to become pregnant or think they might be pregnant. Men should not father a child while taking this medicine and for 3 months after stopping it. There is potential for serious side effects to an unborn child. Talk to your health care professional for more information. Do not breast-feed an infant while taking this medicine or for 7 days after stopping it. This medicine has caused ovarian failure in some women. This medicine may make it more difficult to get pregnant. Talk to your health care professional if you are concerned about your fertility. This medicine has caused decreased sperm counts in some men. This may make it more difficult to father a child. Talk to your health care professional if you are concerned about your fertility. What side effects may I notice from receiving this medication? Side effects that you should report to your doctor or health care professional as soon as possible: allergic reactions like skin rash, itching or hives, swelling of the face, lips, or tongue chest pain diarrhea flushing, runny nose, sweating during infusion low blood counts - this medicine may decrease the number of white blood cells, red blood cells and platelets. You may be at increased risk for infections  and bleeding. nausea, vomiting pain, swelling, warmth in the leg signs of decreased platelets or bleeding - bruising, pinpoint red spots on the skin, black, tarry stools, blood in the urine signs of infection - fever or chills, cough, sore throat, pain or difficulty passing urine signs of decreased red blood cells - unusually weak or tired, fainting spells, lightheadedness Side effects that usually do not require medical attention (report to your doctor or health care professional if they continue or are bothersome): constipation hair loss headache loss of appetite mouth sores stomach pain This list may not describe all possible side effects. Call your doctor for medical advice about side effects. You may report side effects to FDA at 1-800-FDA-1088. Where should I keep my medication? This drug is given in a hospital or clinic and will not be stored at home. NOTE: This sheet is a summary. It may not cover all possible information. If you have questions about this medicine,  talk to your doctor, pharmacist, or health care provider.  2022 [employer]/Gold Standard (2020-12-16 00:00:00)  Leucovorin injection What is this medication? LEUCOVORIN (loo koe VOR in) is used to prevent or treat the harmful effects of some medicines. This medicine is used to treat anemia caused by a low amount of folic acid in the body. It is also used with 5-fluorouracil (5-FU) to treat colon cancer. This medicine may be used for other purposes; ask your health care provider or pharmacist if you have questions. What should I tell my care team before I take this medication? They need to know if you have any of these conditions: anemia from low levels of vitamin B-12 in the blood an unusual or allergic reaction to leucovorin, folic acid, other medicines, foods, dyes, or preservatives pregnant or trying to get pregnant breast-feeding How should I use this medication? This medicine is for injection into a muscle or into  a vein. It is given by a health care professional in a hospital or clinic setting. Talk to your pediatrician regarding the use of this medicine in children. Special care may be needed. Overdosage: If you think you have taken too much of this medicine contact a poison control center or emergency room at once. NOTE: This medicine is only for you. Do not share this medicine with others. What if I miss a dose? This does not apply. What may interact with this medication? capecitabine fluorouracil phenobarbital phenytoin primidone trimethoprim-sulfamethoxazole This list may not describe all possible interactions. Give your health care provider a list of all the medicines, herbs, non-prescription drugs, or dietary supplements you use. Also tell them if you smoke, drink alcohol, or use illegal drugs. Some items may interact with your medicine. What should I watch for while using this medication? Your condition will be monitored carefully while you are receiving this medicine. This medicine may increase the side effects of 5-fluorouracil, 5-FU. Tell your doctor or health care professional if you have diarrhea or mouth sores that do not get better or that get worse. What side effects may I notice from receiving this medication? Side effects that you should report to your doctor or health care professional as soon as possible: allergic reactions like skin rash, itching or hives, swelling of the face, lips, or tongue breathing problems fever, infection mouth sores unusual bleeding or bruising unusually weak or tired Side effects that usually do not require medical attention (report to your doctor or health care professional if they continue or are bothersome): constipation or diarrhea loss of appetite nausea, vomiting This list may not describe all possible side effects. Call your doctor for medical advice about side effects. You may report side effects to FDA at 1-800-FDA-1088. Where should I keep  my medication? This drug is given in a hospital or clinic and will not be stored at home. NOTE: This sheet is a summary. It may not cover all possible information. If you have questions about this medicine, talk to your doctor, pharmacist, or health care provider.  2022 Elsevier/Gold Standard (2007-10-05 00:00:00)  Fluorouracil, 5-FU injection What is this medication? FLUOROURACIL, 5-FU (flure oh YOOR a sil) is a chemotherapy drug. It slows the growth of cancer cells. This medicine is used to treat many types of cancer like breast cancer, colon or rectal cancer, pancreatic cancer, and stomach cancer. This medicine may be used for other purposes; ask your health care provider or pharmacist if you have questions. COMMON BRAND NAME(S): Adrucil What should I tell my care team  before I take this medication? They need to know if you have any of these conditions: blood disorders dihydropyrimidine dehydrogenase (DPD) deficiency infection (especially a virus infection such as chickenpox, cold sores, or herpes) kidney disease liver disease malnourished, poor nutrition recent or ongoing radiation therapy an unusual or allergic reaction to fluorouracil, other chemotherapy, other medicines, foods, dyes, or preservatives pregnant or trying to get pregnant breast-feeding How should I use this medication? This drug is given as an infusion or injection into a vein. It is administered in a hospital or clinic by a specially trained health care professional. Talk to your pediatrician regarding the use of this medicine in children. Special care may be needed. Overdosage: If you think you have taken too much of this medicine contact a poison control center or emergency room at once. NOTE: This medicine is only for you. Do not share this medicine with others. What if I miss a dose? It is important not to miss your dose. Call your doctor or health care professional if you are unable to keep an  appointment. What may interact with this medication? Do not take this medicine with any of the following medications: live virus vaccines This medicine may also interact with the following medications: medicines that treat or prevent blood clots like warfarin, enoxaparin, and dalteparin This list may not describe all possible interactions. Give your health care provider a list of all the medicines, herbs, non-prescription drugs, or dietary supplements you use. Also tell them if you smoke, drink alcohol, or use illegal drugs. Some items may interact with your medicine. What should I watch for while using this medication? Visit your doctor for checks on your progress. This drug may make you feel generally unwell. This is not uncommon, as chemotherapy can affect healthy cells as well as cancer cells. Report any side effects. Continue your course of treatment even though you feel ill unless your doctor tells you to stop. In some cases, you may be given additional medicines to help with side effects. Follow all directions for their use. Call your doctor or health care professional for advice if you get a fever, chills or sore throat, or other symptoms of a cold or flu. Do not treat yourself. This drug decreases your body's ability to fight infections. Try to avoid being around people who are sick. This medicine may increase your risk to bruise or bleed. Call your doctor or health care professional if you notice any unusual bleeding. Be careful brushing and flossing your teeth or using a toothpick because you may get an infection or bleed more easily. If you have any dental work done, tell your dentist you are receiving this medicine. Avoid taking products that contain aspirin, acetaminophen, ibuprofen, naproxen, or ketoprofen unless instructed by your doctor. These medicines may hide a fever. Do not become pregnant while taking this medicine. Women should inform their doctor if they wish to become pregnant  or think they might be pregnant. There is a potential for serious side effects to an unborn child. Talk to your health care professional or pharmacist for more information. Do not breast-feed an infant while taking this medicine. Men should inform their doctor if they wish to father a child. This medicine may lower sperm counts. Do not treat diarrhea with over the counter products. Contact your doctor if you have diarrhea that lasts more than 2 days or if it is severe and watery. This medicine can make you more sensitive to the sun. Keep out of the  sun. If you cannot avoid being in the sun, wear protective clothing and use sunscreen. Do not use sun lamps or tanning beds/booths. What side effects may I notice from receiving this medication? Side effects that you should report to your doctor or health care professional as soon as possible: allergic reactions like skin rash, itching or hives, swelling of the face, lips, or tongue low blood counts - this medicine may decrease the number of white blood cells, red blood cells and platelets. You may be at increased risk for infections and bleeding. signs of infection - fever or chills, cough, sore throat, pain or difficulty passing urine signs of decreased platelets or bleeding - bruising, pinpoint red spots on the skin, black, tarry stools, blood in the urine signs of decreased red blood cells - unusually weak or tired, fainting spells, lightheadedness breathing problems changes in vision chest pain mouth sores nausea and vomiting pain, swelling, redness at site where injected pain, tingling, numbness in the hands or feet redness, swelling, or sores on hands or feet stomach pain unusual bleeding Side effects that usually do not require medical attention (report to your doctor or health care professional if they continue or are bothersome): changes in finger or toe nails diarrhea dry or itchy skin hair loss headache loss of appetite sensitivity  of eyes to the light stomach upset unusually teary eyes This list may not describe all possible side effects. Call your doctor for medical advice about side effects. You may report side effects to FDA at 1-800-FDA-1088. Where should I keep my medication? This drug is given in a hospital or clinic and will not be stored at home. NOTE: This sheet is a summary. It may not cover all possible information. If you have questions about this medicine, talk to your doctor, pharmacist, or health care provider.  2022 [employer]vier/Gold Standard (2020-12-16 00:00:00)

## 2021-05-12 NOTE — Progress Notes (Signed)
[facility] OFFICE PROGRESS NOTE   Diagnosis: Colon cancer  INTERVAL HISTORY:   Sabrina [name] completed on cycle FOLFIRI 04/28/2021.  No nausea/vomiting or mouth sores.  She reports diarrhea during the week following chemotherapy.  The diarrhea improved with Imodium.  No abdominal pain.  Objective:  Vital signs in last 24 hours:  Blood pressure 139/75, pulse 82, temperature 98.7 F (37.1 C), temperature source Oral, resp. rate 20, height _0  (1.778 m), weight 136 lb (61.7 kg), SpO2 99 %.    HEENT: No thrush or ulcers Resp: Lungs with scattered rhonchi and wheezes, no respiratory distress Cardio: Regular rate and rhythm GI: No hepatosplenomegaly, no mass, nontender Vascular: No leg edema  Portacath/PICC-without erythema  Lab Results:  Lab Results  Component Value Date   WBC 9.2 05/12/2021   HGB 11.9 (L) 05/12/2021   HCT 39.0 05/12/2021   MCV 95.4 05/12/2021   PLT 101 (L) 05/12/2021   NEUTROABS 7.9 (H) 05/12/2021    CMP  Lab Results  Component Value Date   NA 140 05/12/2021   K 3.8 05/12/2021   CL 103 05/12/2021   CO2 30 05/12/2021   GLUCOSE 159 (H) 05/12/2021   BUN 9 05/12/2021   CREATININE 0.45 05/12/2021   CALCIUM 9.2 05/12/2021   PROT 6.7 05/12/2021   ALBUMIN 3.9 05/12/2021   AST 22 05/12/2021   ALT 13 05/12/2021   ALKPHOS 194 (H) 05/12/2021   BILITOT 0.4 05/12/2021   GFRNONAA >60 05/12/2021   GFRAA >60 08/17/2019    Lab Results  Component Value Date   CEA1 1.40 12/16/2020   CEA 1.57 02/16/2021    Lab Results  Component Value Date   INR 2.4 (H) 05/12/2021   LABPROT 26.2 (H) 05/12/2021    Medications: I have reviewed the patient's current medications.   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, lymphovascular invasion present including large vessel extramural invasion, loss of MLH1 and PMS2, MLH1 hyper  methylation present Incomplete colonoscopy secondary to stricturing at the sigmoid colon Virtual colonoscopy 03/13/2020- mass centered at the medial wall of the cecum and terminal ileum, adjacent ileocolic mesenteric adenopathy, nonspecific 3 mm right lower lobe nodule PET 94/49/6759 hypermetabolic cecal/terminal ileum mass with adjacent enlarged and hypermetabolic ileocolic lymph nodes, previously noted segment 7 liver lesion-not hypermetabolic- benign etiology favored, dominant left lower lobe pulmonary nodule not hypermetabolic, other tiny nodules not hypermetabolic and below PET sensitivity Cycle 1 FOLFOX 05/12/2020 Cycle 2 FOLFOX 05/26/2020 Cycle 3 FOLFOX 06/09/2020 Cycle 4 FOLFOX 06/23/2020 Cycle 5 FOLFOX 07/07/2020 Cycle 12 FOLFOX 10/14/2020, oxaliplatin dose reduced beginning with cycle 9 CT 12/04/2020-development of peripheral enhancing hypoattenuating mass in segment 5/8, necrotic porta hepatis lymphadenopathy, similar appearance of previously visualized nonhypermetabolic subcapsular segment 7 lesion Ultrasound-guided biopsy right liver lesion 12/22/2020-adenocarcinoma consistent with a colon primary PET scan 12/30/2020-hypermetabolic hepatic lesion and portal adenopathy-increased in size compared to 12/04/2020 CT, small hypermetabolic left periaortic node Foundation 1-MSI high, tumor mutation burden 31, K-ras wild-type Cycle 1 Pembrolizumab 01/15/2021 Cycle 1 FOLFIRI 02/16/2021 Cycle 2 FOLFIRI 03/02/2021-Emend and Udenyca added Cycle 3 FOLFIRI 03/17/2021 Cycle 4 FOLFIRI 03/31/2021 Cycle 5 FOLFIRI 04/15/2021 CT abdomen/pelvis 04/23/2021-mild decrease in size of right hepatic lobe metastases, mild decrease in porta hepatis and portacaval lymphadenopathy, mild increase in left periaortic lymphadenopathy Cycle 6 FOLFIRI 04/28/2021 Cycle 7 FOLFIRI 05/12/2021 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  She is tolerating the chemotherapy well.  She will complete another cycle of FOLFIRI today.  The plan is to complete 9 cycles of FOLFIRI prior to another restaging CT.  We will then consider continuing FOLFIRI, switching to a maintenance regimen, or a repeat trial of pembrolizumab.  She will return for an office visit and chemotherapy in 2 weeks.  The PT/INR is therapeutic.  She continues Coumadin anticoagulation.  Betsy Coder, MD  05/12/2021  11:27 AM

## 2021-05-12 NOTE — Progress Notes (Deleted)
[facility] OFFICE PROGRESS NOTE   Diagnosis:   INTERVAL HISTORY:   ***  Objective:  Vital signs in last 24 hours:  Blood pressure 139/75, pulse 82, temperature 98.7 F (37.1 C), temperature source Oral, resp. rate 20, height 5' 10"  (1.778 m), weight 136 lb (61.7 kg), SpO2 99 %.    HEENT: *** Lymphatics: *** Resp: *** Cardio: *** GI: *** Vascular: *** Neuro:***  Skin:***   Portacath/PICC-without erythema  Lab Results:  Lab Results  Component Value Date   WBC 9.2 05/12/2021   HGB 11.9 (L) 05/12/2021   HCT 39.0 05/12/2021   MCV 95.4 05/12/2021   PLT 101 (L) 05/12/2021   NEUTROABS 7.9 (H) 05/12/2021    CMP  Lab Results  Component Value Date   NA 140 05/12/2021   K 3.8 05/12/2021   CL 103 05/12/2021   CO2 30 05/12/2021   GLUCOSE 159 (H) 05/12/2021   BUN 9 05/12/2021   CREATININE 0.45 05/12/2021   CALCIUM 9.2 05/12/2021   PROT 6.7 05/12/2021   ALBUMIN 3.9 05/12/2021   AST 22 05/12/2021   ALT 13 05/12/2021   ALKPHOS 194 (H) 05/12/2021   BILITOT 0.4 05/12/2021   GFRNONAA >60 05/12/2021   GFRAA >60 08/17/2019    Lab Results  Component Value Date   CEA1 1.40 12/16/2020   CEA 1.57 02/16/2021    Lab Results  Component Value Date   INR 2.4 (H) 05/12/2021   LABPROT 26.2 (H) 05/12/2021    Imaging:  No results found.  Medications: I have reviewed the patient's current medications.   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, lymphovascular invasion present including large vessel extramural invasion, loss of MLH1 and PMS2, MLH1 hyper methylation present Incomplete colonoscopy secondary to stricturing at the sigmoid colon Virtual colonoscopy 03/13/2020- mass centered at the medial wall of the cecum and terminal ileum, adjacent ileocolic mesenteric adenopathy, nonspecific 3 mm right lower lobe nodule PET  22/29/7989 hypermetabolic cecal/terminal ileum mass with adjacent enlarged and hypermetabolic ileocolic lymph nodes, previously noted segment 7 liver lesion-not hypermetabolic- benign etiology favored, dominant left lower lobe pulmonary nodule not hypermetabolic, other tiny nodules not hypermetabolic and below PET sensitivity Cycle 1 FOLFOX 05/12/2020 Cycle 2 FOLFOX 05/26/2020 Cycle 3 FOLFOX 06/09/2020 Cycle 4 FOLFOX 06/23/2020 Cycle 5 FOLFOX 07/07/2020 Cycle 12 FOLFOX 10/14/2020, oxaliplatin dose reduced beginning with cycle 9 CT 12/04/2020-development of peripheral enhancing hypoattenuating mass in segment 5/8, necrotic porta hepatis lymphadenopathy, similar appearance of previously visualized nonhypermetabolic subcapsular segment 7 lesion Ultrasound-guided biopsy right liver lesion 12/22/2020-adenocarcinoma consistent with a colon primary PET scan 12/30/2020-hypermetabolic hepatic lesion and portal adenopathy-increased in size compared to 12/04/2020 CT, small hypermetabolic left periaortic node Foundation 1-MSI high, tumor mutation burden 31, K-ras wild-type Cycle 1 Pembrolizumab 01/15/2021 Cycle 1 FOLFIRI 02/16/2021 Cycle 2 FOLFIRI 03/02/2021-Emend and Udenyca added Cycle 3 FOLFIRI 03/17/2021 Cycle 4 FOLFIRI 03/31/2021 Cycle 5 FOLFIRI 04/15/2021 CT abdomen/pelvis 04/23/2021-mild decrease in size of right hepatic lobe metastases, mild decrease in porta hepatis and portacaval lymphadenopathy, mild increase in left periaortic lymphadenopathy Cycle 6 FOLFIRI 04/28/2021 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

## 2021-05-12 NOTE — Patient Instructions (Addendum)
Description   Called and spoke with pt's daughter. Instructed for pt to continue 10mg  daily except 5mg  on Mondays, Wednesdays and Saturdays.  Recheck INR 2 weeks at [facility]. Coumadin Clinic# [phone].  **PT/INR drawn at [facility]**

## 2021-05-12 NOTE — Progress Notes (Signed)
Patient seen by Dr. Sherrill today ? ?Vitals are within treatment parameters. ? ?Labs reviewed by Dr. Sherrill and are within treatment parameters. ? ?Per physician team, patient is ready for treatment and there are NO modifications to the treatment plan.  ?

## 2021-05-14 ENCOUNTER — Inpatient Hospital Stay: Payer: Medicare Other | Attending: Nurse Practitioner

## 2021-05-14 ENCOUNTER — Other Ambulatory Visit: Payer: Self-pay

## 2021-05-14 VITALS — BP 120/67 | HR 73 | Temp 98.7°F | Resp 18

## 2021-05-14 DIAGNOSIS — I4891 Unspecified atrial fibrillation: Secondary | ICD-10-CM | POA: Diagnosis not present

## 2021-05-14 DIAGNOSIS — E039 Hypothyroidism, unspecified: Secondary | ICD-10-CM | POA: Insufficient documentation

## 2021-05-14 DIAGNOSIS — D509 Iron deficiency anemia, unspecified: Secondary | ICD-10-CM | POA: Insufficient documentation

## 2021-05-14 DIAGNOSIS — Z79811 Long term (current) use of aromatase inhibitors: Secondary | ICD-10-CM | POA: Diagnosis not present

## 2021-05-14 DIAGNOSIS — Z7901 Long term (current) use of anticoagulants: Secondary | ICD-10-CM | POA: Diagnosis not present

## 2021-05-14 DIAGNOSIS — Z5189 Encounter for other specified aftercare: Secondary | ICD-10-CM | POA: Diagnosis not present

## 2021-05-14 DIAGNOSIS — C50411 Malignant neoplasm of upper-outer quadrant of right female breast: Secondary | ICD-10-CM

## 2021-05-14 DIAGNOSIS — Z170 Estrogen receptor positive status [ER+]: Secondary | ICD-10-CM | POA: Insufficient documentation

## 2021-05-14 DIAGNOSIS — C787 Secondary malignant neoplasm of liver and intrahepatic bile duct: Secondary | ICD-10-CM | POA: Insufficient documentation

## 2021-05-14 DIAGNOSIS — C180 Malignant neoplasm of cecum: Secondary | ICD-10-CM | POA: Insufficient documentation

## 2021-05-14 DIAGNOSIS — Z5111 Encounter for antineoplastic chemotherapy: Secondary | ICD-10-CM | POA: Diagnosis not present

## 2021-05-14 DIAGNOSIS — E119 Type 2 diabetes mellitus without complications: Secondary | ICD-10-CM | POA: Diagnosis not present

## 2021-05-14 DIAGNOSIS — C189 Malignant neoplasm of colon, unspecified: Secondary | ICD-10-CM

## 2021-05-14 DIAGNOSIS — C50911 Malignant neoplasm of unspecified site of right female breast: Secondary | ICD-10-CM | POA: Diagnosis not present

## 2021-05-14 DIAGNOSIS — Z79899 Other long term (current) drug therapy: Secondary | ICD-10-CM | POA: Insufficient documentation

## 2021-05-14 DIAGNOSIS — G620 Drug-induced polyneuropathy: Secondary | ICD-10-CM | POA: Insufficient documentation

## 2021-05-14 DIAGNOSIS — Z923 Personal history of irradiation: Secondary | ICD-10-CM | POA: Diagnosis not present

## 2021-05-14 MED ORDER — PEGFILGRASTIM-CBQV 6 MG/0.6ML ~~LOC~~ SOSY
6.0000 mg | PREFILLED_SYRINGE | Freq: Once | SUBCUTANEOUS | Status: AC
  Administered 2021-05-14: 6 mg via SUBCUTANEOUS

## 2021-05-14 MED ORDER — SODIUM CHLORIDE 0.9% FLUSH
10.0000 mL | INTRAVENOUS | Status: DC | PRN
  Administered 2021-05-14: 10 mL

## 2021-05-14 MED ORDER — HEPARIN SOD (PORK) LOCK FLUSH 100 UNIT/ML IV SOLN
500.0000 [IU] | Freq: Once | INTRAVENOUS | Status: AC | PRN
  Administered 2021-05-14: 500 [IU]

## 2021-05-14 NOTE — Patient Instructions (Signed)
[facility]  Discharge Instructions: Thank you for choosing [facility] to provide your oncology and hematology care.   If you have a lab appointment with the [facility], please go directly to the [facility] and check in at the registration area.   Wear comfortable clothing and clothing appropriate for easy access to any Portacath or PICC line.   We strive to give you quality time with your provider. You may need to reschedule your appointment if you arrive late (15 or more minutes).  Arriving late affects you and other patients whose appointments are after yours.  Also, if you miss three or more appointments without notifying the office, you may be dismissed from the clinic at the providers discretion.      For prescription refill requests, have your pharmacy contact our office and allow 72 hours for refills to be completed.     To help prevent nausea and vomiting after your treatment, we encourage you to take your nausea medication as directed.  BELOW ARE SYMPTOMS THAT SHOULD BE REPORTED IMMEDIATELY: *FEVER GREATER THAN 100.4 F (38 C) OR HIGHER *CHILLS OR SWEATING *NAUSEA AND VOMITING THAT IS NOT CONTROLLED WITH YOUR NAUSEA MEDICATION *UNUSUAL SHORTNESS OF BREATH *UNUSUAL BRUISING OR BLEEDING *URINARY PROBLEMS (pain or burning when urinating, or frequent urination) *BOWEL PROBLEMS (unusual diarrhea, constipation, pain near the anus) TENDERNESS IN MOUTH AND THROAT WITH OR WITHOUT PRESENCE OF ULCERS (sore throat, sores in mouth, or a toothache) UNUSUAL RASH, SWELLING OR PAIN  UNUSUAL VAGINAL DISCHARGE OR ITCHING   Items with * indicate a potential emergency and should be followed up as soon as possible or go to the Emergency Department if any problems should occur.  Please show the CHEMOTHERAPY ALERT CARD or IMMUNOTHERAPY ALERT CARD at check-in to the Emergency Department and triage nurse.  Should you have questions after your visit or  need to cancel or reschedule your appointment, please contact [facility]  Dept: [phone]  and follow the prompts.  Office hours are 8:00 a.m. to 4:30 p.m. Monday - Friday. Please note that voicemails left after 4:00 p.m. may not be returned until the following business day.  We are closed weekends and major holidays. You have access to a nurse at all times for urgent questions. Please call the main number to the clinic Dept: [phone] and follow the prompts.   For any non-urgent questions, you may also contact your provider using MyChart. We now offer e-Visits for anyone 57 and older to request care online for non-urgent symptoms. For details visit [email or web address].   Also download the MyChart app! Go to the app store, search "MyChart", open the app, select , and log in with your MyChart username and password.  Due to Covid, a mask is required upon entering the hospital/clinic. If you do not have a mask, one will be given to you upon arrival. For doctor visits, patients may have 1 support person aged 42 or older with them. For treatment visits, patients cannot have anyone with them due to current Covid guidelines and our immunocompromised population.

## 2021-05-21 ENCOUNTER — Other Ambulatory Visit: Payer: Self-pay

## 2021-05-21 DIAGNOSIS — C189 Malignant neoplasm of colon, unspecified: Secondary | ICD-10-CM

## 2021-05-21 DIAGNOSIS — C787 Secondary malignant neoplasm of liver and intrahepatic bile duct: Secondary | ICD-10-CM

## 2021-05-22 ENCOUNTER — Telehealth: Payer: Self-pay

## 2021-05-22 NOTE — Telephone Encounter (Signed)
Patient called and left a voicemail asking if we can draw a TSH and A1C for lab on 05/26/21. Spoke with Lattie Haw and she advice me to have the patient's PCP to put the order in. Called and spoke with the patient to let her know we can draw the lab but she need her PCP to put the order in.  Patient stated she will stop by her PCP to have them to draw the lab. Patient voiced understanding

## 2021-05-24 ENCOUNTER — Other Ambulatory Visit: Payer: Self-pay | Admitting: Oncology

## 2021-05-26 ENCOUNTER — Inpatient Hospital Stay: Payer: Medicare Other

## 2021-05-26 ENCOUNTER — Other Ambulatory Visit: Payer: Self-pay

## 2021-05-26 ENCOUNTER — Other Ambulatory Visit: Payer: Self-pay | Admitting: *Deleted

## 2021-05-26 ENCOUNTER — Inpatient Hospital Stay (HOSPITAL_BASED_OUTPATIENT_CLINIC_OR_DEPARTMENT_OTHER): Payer: Medicare Other | Admitting: Nurse Practitioner

## 2021-05-26 ENCOUNTER — Encounter: Payer: Self-pay | Admitting: Nurse Practitioner

## 2021-05-26 ENCOUNTER — Ambulatory Visit (INDEPENDENT_AMBULATORY_CARE_PROVIDER_SITE_OTHER): Payer: Medicare Other

## 2021-05-26 VITALS — BP 149/76 | HR 65 | Temp 98.1°F | Resp 18 | Ht 70.0 in | Wt 133.6 lb

## 2021-05-26 VITALS — BP 147/80 | HR 71

## 2021-05-26 DIAGNOSIS — C787 Secondary malignant neoplasm of liver and intrahepatic bile duct: Secondary | ICD-10-CM

## 2021-05-26 DIAGNOSIS — Z5111 Encounter for antineoplastic chemotherapy: Secondary | ICD-10-CM | POA: Diagnosis not present

## 2021-05-26 DIAGNOSIS — Z5181 Encounter for therapeutic drug level monitoring: Secondary | ICD-10-CM | POA: Diagnosis not present

## 2021-05-26 DIAGNOSIS — I4891 Unspecified atrial fibrillation: Secondary | ICD-10-CM | POA: Diagnosis not present

## 2021-05-26 DIAGNOSIS — C189 Malignant neoplasm of colon, unspecified: Secondary | ICD-10-CM | POA: Diagnosis not present

## 2021-05-26 DIAGNOSIS — Z170 Estrogen receptor positive status [ER+]: Secondary | ICD-10-CM

## 2021-05-26 LAB — CBC WITH DIFFERENTIAL (CANCER CENTER ONLY)
Abs Immature Granulocytes: 0.05 10*3/uL (ref 0.00–0.07)
Basophils Absolute: 0 10*3/uL (ref 0.0–0.1)
Basophils Relative: 0 %
Eosinophils Absolute: 0 10*3/uL (ref 0.0–0.5)
Eosinophils Relative: 0 %
HCT: 39.6 % (ref 36.0–46.0)
Hemoglobin: 12.5 g/dL (ref 12.0–15.0)
Immature Granulocytes: 1 %
Lymphocytes Relative: 5 %
Lymphs Abs: 0.5 10*3/uL — ABNORMAL LOW (ref 0.7–4.0)
MCH: 29.6 pg (ref 26.0–34.0)
MCHC: 31.6 g/dL (ref 30.0–36.0)
MCV: 93.8 fL (ref 80.0–100.0)
Monocytes Absolute: 0.6 10*3/uL (ref 0.1–1.0)
Monocytes Relative: 6 %
Neutro Abs: 8.3 10*3/uL — ABNORMAL HIGH (ref 1.7–7.7)
Neutrophils Relative %: 88 %
Platelet Count: 105 10*3/uL — ABNORMAL LOW (ref 150–400)
RBC: 4.22 MIL/uL (ref 3.87–5.11)
RDW: 16.5 % — ABNORMAL HIGH (ref 11.5–15.5)
WBC Count: 9.4 10*3/uL (ref 4.0–10.5)
nRBC: 0 % (ref 0.0–0.2)

## 2021-05-26 LAB — CMP (CANCER CENTER ONLY)
ALT: 11 U/L (ref 0–44)
AST: 20 U/L (ref 15–41)
Albumin: 4.2 g/dL (ref 3.5–5.0)
Alkaline Phosphatase: 188 U/L — ABNORMAL HIGH (ref 38–126)
Anion gap: 7 (ref 5–15)
BUN: 9 mg/dL (ref 8–23)
CO2: 31 mmol/L (ref 22–32)
Calcium: 9.5 mg/dL (ref 8.9–10.3)
Chloride: 103 mmol/L (ref 98–111)
Creatinine: 0.45 mg/dL (ref 0.44–1.00)
GFR, Estimated: >60 mL/min (ref >60)
Glucose, Bld: 141 mg/dL — ABNORMAL HIGH (ref 70–99)
Potassium: 3.4 mmol/L — ABNORMAL LOW (ref 3.5–5.1)
Sodium: 141 mmol/L (ref 135–145)
Total Bilirubin: 0.4 mg/dL (ref 0.3–1.2)
Total Protein: 7 g/dL (ref 6.5–8.1)

## 2021-05-26 LAB — PROTIME-INR
INR: 2 — ABNORMAL HIGH (ref 0.8–1.2)
Prothrombin Time: 22.2 seconds — ABNORMAL HIGH (ref 11.4–15.2)

## 2021-05-26 MED ORDER — SODIUM CHLORIDE 0.9 % IV SOLN
180.0000 mg/m2 | Freq: Once | INTRAVENOUS | Status: AC
  Administered 2021-05-26: 300 mg via INTRAVENOUS
  Filled 2021-05-26: qty 15

## 2021-05-26 MED ORDER — ATROPINE SULFATE 1 MG/ML IV SOLN
0.5000 mg | Freq: Once | INTRAVENOUS | Status: AC | PRN
  Administered 2021-05-26: 0.5 mg via INTRAVENOUS
  Filled 2021-05-26: qty 1

## 2021-05-26 MED ORDER — FLUOROURACIL CHEMO INJECTION 2.5 GM/50ML
400.0000 mg/m2 | Freq: Once | INTRAVENOUS | Status: AC
  Administered 2021-05-26: 650 mg via INTRAVENOUS
  Filled 2021-05-26: qty 13

## 2021-05-26 MED ORDER — PALONOSETRON HCL INJECTION 0.25 MG/5ML
0.2500 mg | Freq: Once | INTRAVENOUS | Status: AC
  Administered 2021-05-26: 0.25 mg via INTRAVENOUS
  Filled 2021-05-26: qty 5

## 2021-05-26 MED ORDER — SODIUM CHLORIDE 0.9 % IV SOLN
10.0000 mg | Freq: Once | INTRAVENOUS | Status: AC
  Administered 2021-05-26: 10 mg via INTRAVENOUS
  Filled 2021-05-26: qty 1

## 2021-05-26 MED ORDER — SODIUM CHLORIDE 0.9 % IV SOLN
2400.0000 mg/m2 | INTRAVENOUS | Status: DC
  Administered 2021-05-26: 4000 mg via INTRAVENOUS
  Filled 2021-05-26: qty 80

## 2021-05-26 MED ORDER — SODIUM CHLORIDE 0.9 % IV SOLN
150.0000 mg | Freq: Once | INTRAVENOUS | Status: AC
  Administered 2021-05-26: 150 mg via INTRAVENOUS
  Filled 2021-05-26: qty 5

## 2021-05-26 MED ORDER — SODIUM CHLORIDE 0.9 % IV SOLN
400.0000 mg/m2 | Freq: Once | INTRAVENOUS | Status: AC
  Administered 2021-05-26: 668 mg via INTRAVENOUS
  Filled 2021-05-26: qty 33.4

## 2021-05-26 MED ORDER — SODIUM CHLORIDE 0.9 % IV SOLN: Freq: Once | INTRAVENOUS | Status: AC

## 2021-05-26 NOTE — Progress Notes (Signed)
Referral placed for social work to follow

## 2021-05-26 NOTE — Patient Instructions (Signed)
Description   Called pt and pt's daughter. Instructed for pt to continue 10mg  daily except 5mg  on Mondays, Wednesdays and Saturdays. Recheck INR 2 weeks at [facility]. Coumadin Clinic# [phone].  **PT/INR drawn at [facility]**

## 2021-05-26 NOTE — Patient Instructions (Addendum)
[facility] The chemotherapy medication bag should finish at 46 hours, 96 hours, or 7 days. For example, if your pump is scheduled for 46 hours and it was put on at 4:00 p.m., it should finish at 2:00 p.m. the day it is scheduled to come off regardless of your appointment time.     Estimated time to finish at 12:15pm Thursday, May 28, 2021.   If the display on your pump reads "Low Volume" and it is beeping, take the batteries out of the pump and come to the cancer center for it to be taken off.   If the pump alarms go off prior to the pump reading "Low Volume" then call [phone] and someone can assist you.  If the plunger comes out and the chemotherapy medication is leaking out, please use your home chemo spill kit to clean up the spill. Do NOT use paper towels or other household products.  If you have problems or questions regarding your pump, please call either [phone] (24 hours a day) or the cancer center Monday-Friday 8:00 a.m.- 4:30 p.m. at the clinic number and we will assist you. If you are unable to get assistance, then go to the nearest Emergency Department and ask the staff to contact the IV team for assistance.    Discharge Instructions: Thank you for choosing [facility] to provide your oncology and hematology care.   If you have a lab appointment with the [facility], please go directly to the [facility] and check in at the registration area.   Wear comfortable clothing and clothing appropriate for easy access to any Portacath or PICC line.   We strive to give you quality time with your provider. You may need to reschedule your appointment if you arrive late (15 or more minutes).  Arriving late affects you and other patients whose appointments are after yours.  Also, if you miss three or more appointments without notifying the office, you may be dismissed from the clinic at the providers discretion.      For  prescription refill requests, have your pharmacy contact our office and allow 72 hours for refills to be completed.    Today you received the following chemotherapy and/or immunotherapy agents irinotecan, leucovorin, fluorouracil      To help prevent nausea and vomiting after your treatment, we encourage you to take your nausea medication as directed.  BELOW ARE SYMPTOMS THAT SHOULD BE REPORTED IMMEDIATELY: *FEVER GREATER THAN 100.4 F (38 C) OR HIGHER *CHILLS OR SWEATING *NAUSEA AND VOMITING THAT IS NOT CONTROLLED WITH YOUR NAUSEA MEDICATION *UNUSUAL SHORTNESS OF BREATH *UNUSUAL BRUISING OR BLEEDING *URINARY PROBLEMS (pain or burning when urinating, or frequent urination) *BOWEL PROBLEMS (unusual diarrhea, constipation, pain near the anus) TENDERNESS IN MOUTH AND THROAT WITH OR WITHOUT PRESENCE OF ULCERS (sore throat, sores in mouth, or a toothache) UNUSUAL RASH, SWELLING OR PAIN  UNUSUAL VAGINAL DISCHARGE OR ITCHING   Items with * indicate a potential emergency and should be followed up as soon as possible or go to the Emergency Department if any problems should occur.  Please show the CHEMOTHERAPY ALERT CARD or IMMUNOTHERAPY ALERT CARD at check-in to the Emergency Department and triage nurse.  Should you have questions after your visit or need to cancel or reschedule your appointment, please contact [facility]  Dept: [phone]  and follow the prompts.  Office hours are 8:00 a.m. to 4:30 p.m. Monday - Friday. Please note that voicemails  left after 4:00 p.m. may not be returned until the following business day.  We are closed weekends and major holidays. You have access to a nurse at all times for urgent questions. Please call the main number to the clinic Dept: [phone] and follow the prompts.   For any non-urgent questions, you may also contact your provider using MyChart. We now offer e-Visits for anyone 32 and older to request care online for  non-urgent symptoms. For details visit [email or web address].   Also download the MyChart app! Go to the app store, search "MyChart", open the app, select [facility], and log in with your MyChart username and password.  Due to Covid, a mask is required upon entering the hospital/clinic. If you do not have a mask, one will be given to you upon arrival. For doctor visits, patients may have 1 support person aged 48 or older with them. For treatment visits, patients cannot have anyone with them due to current Covid guidelines and our immunocompromised population.   Irinotecan injection What is this medication? IRINOTECAN (ir in oh TEE kan ) is a chemotherapy drug. It is used to treat colon and rectal cancer. This medicine may be used for other purposes; ask your health care provider or pharmacist if you have questions. COMMON BRAND NAME(S): Camptosar What should I tell my care team before I take this medication? They need to know if you have any of these conditions: dehydration diarrhea infection (especially a virus infection such as chickenpox, cold sores, or herpes) liver disease low blood counts, like low white cell, platelet, or red cell counts low levels of calcium, magnesium, or potassium in the blood recent or ongoing radiation therapy an unusual or allergic reaction to irinotecan, other medicines, foods, dyes, or preservatives pregnant or trying to get pregnant breast-feeding How should I use this medication? This drug is given as an infusion into a vein. It is administered in a hospital or clinic by a specially trained health care professional. Talk to your pediatrician regarding the use of this medicine in children. Special care may be needed. Overdosage: If you think you have taken too much of this medicine contact a poison control center or emergency room at once. NOTE: This medicine is only for you. Do not share this medicine with others. What if I miss a dose? It is important  not to miss your dose. Call your doctor or health care professional if you are unable to keep an appointment. What may interact with this medication? Do not take this medicine with any of the following medications: cobicistat itraconazole This medicine may interact with the following medications: antiviral medicines for HIV or AIDS certain antibiotics like rifampin or rifabutin certain medicines for fungal infections like ketoconazole, posaconazole, and voriconazole certain medicines for seizures like carbamazepine, phenobarbital, phenotoin clarithromycin gemfibrozil nefazodone [facility]. John's Wort This list may not describe all possible interactions. Give your health care provider a list of all the medicines, herbs, non-prescription drugs, or dietary supplements you use. Also tell them if you smoke, drink alcohol, or use illegal drugs. Some items may interact with your medicine. What should I watch for while using this medication? Your condition will be monitored carefully while you are receiving this medicine. You will need important blood work done while you are taking this medicine. This drug may make you feel generally unwell. This is not uncommon, as chemotherapy can affect healthy cells as well as cancer cells. Report any side effects. Continue your course of treatment even though you  feel ill unless your doctor tells you to stop. In some cases, you may be given additional medicines to help with side effects. Follow all directions for their use. You may get drowsy or dizzy. Do not drive, use machinery, or do anything that needs mental alertness until you know how this medicine affects you. Do not stand or sit up quickly, especially if you are an older patient. This reduces the risk of dizzy or fainting spells. Call your health care professional for advice if you get a fever, chills, or sore throat, or other symptoms of a cold or flu. Do not treat yourself. This medicine decreases your body's  ability to fight infections. Try to avoid being around people who are sick. Avoid taking products that contain aspirin, acetaminophen, ibuprofen, naproxen, or ketoprofen unless instructed by your doctor. These medicines may hide a fever. This medicine may increase your risk to bruise or bleed. Call your doctor or health care professional if you notice any unusual bleeding. Be careful brushing and flossing your teeth or using a toothpick because you may get an infection or bleed more easily. If you have any dental work done, tell your dentist you are receiving this medicine. Do not become pregnant while taking this medicine or for 6 months after stopping it. Women should inform their health care professional if they wish to become pregnant or think they might be pregnant. Men should not father a child while taking this medicine and for 3 months after stopping it. There is potential for serious side effects to an unborn child. Talk to your health care professional for more information. Do not breast-feed an infant while taking this medicine or for 7 days after stopping it. This medicine has caused ovarian failure in some women. This medicine may make it more difficult to get pregnant. Talk to your health care professional if you are concerned about your fertility. This medicine has caused decreased sperm counts in some men. This may make it more difficult to father a child. Talk to your health care professional if you are concerned about your fertility. What side effects may I notice from receiving this medication? Side effects that you should report to your doctor or health care professional as soon as possible: allergic reactions like skin rash, itching or hives, swelling of the face, lips, or tongue chest pain diarrhea flushing, runny nose, sweating during infusion low blood counts - this medicine may decrease the number of white blood cells, red blood cells and platelets. You may be at increased risk  for infections and bleeding. nausea, vomiting pain, swelling, warmth in the leg signs of decreased platelets or bleeding - bruising, pinpoint red spots on the skin, black, tarry stools, blood in the urine signs of infection - fever or chills, cough, sore throat, pain or difficulty passing urine signs of decreased red blood cells - unusually weak or tired, fainting spells, lightheadedness Side effects that usually do not require medical attention (report to your doctor or health care professional if they continue or are bothersome): constipation hair loss headache loss of appetite mouth sores stomach pain This list may not describe all possible side effects. Call your doctor for medical advice about side effects. You may report side effects to FDA at 1-800-FDA-1088. Where should I keep my medication? This drug is given in a hospital or clinic and will not be stored at home. NOTE: This sheet is a summary. It may not cover all possible information. If you have questions about this medicine,  talk to your doctor, pharmacist, or health care provider.  2022 Elsevier/Gold Standard (2020-12-16 00:00:00)  Leucovorin injection What is this medication? LEUCOVORIN (loo koe VOR in) is used to prevent or treat the harmful effects of some medicines. This medicine is used to treat anemia caused by a low amount of folic acid in the body. It is also used with 5-fluorouracil (5-FU) to treat colon cancer. This medicine may be used for other purposes; ask your health care provider or pharmacist if you have questions. What should I tell my care team before I take this medication? They need to know if you have any of these conditions: anemia from low levels of vitamin B-12 in the blood an unusual or allergic reaction to leucovorin, folic acid, other medicines, foods, dyes, or preservatives pregnant or trying to get pregnant breast-feeding How should I use this medication? This medicine is for injection into a  muscle or into a vein. It is given by a health care professional in a hospital or clinic setting. Talk to your pediatrician regarding the use of this medicine in children. Special care may be needed. Overdosage: If you think you have taken too much of this medicine contact a poison control center or emergency room at once. NOTE: This medicine is only for you. Do not share this medicine with others. What if I miss a dose? This does not apply. What may interact with this medication? capecitabine fluorouracil phenobarbital phenytoin primidone trimethoprim-sulfamethoxazole This list may not describe all possible interactions. Give your health care provider a list of all the medicines, herbs, non-prescription drugs, or dietary supplements you use. Also tell them if you smoke, drink alcohol, or use illegal drugs. Some items may interact with your medicine. What should I watch for while using this medication? Your condition will be monitored carefully while you are receiving this medicine. This medicine may increase the side effects of 5-fluorouracil, 5-FU. Tell your doctor or health care professional if you have diarrhea or mouth sores that do not get better or that get worse. What side effects may I notice from receiving this medication? Side effects that you should report to your doctor or health care professional as soon as possible: allergic reactions like skin rash, itching or hives, swelling of the face, lips, or tongue breathing problems fever, infection mouth sores unusual bleeding or bruising unusually weak or tired Side effects that usually do not require medical attention (report to your doctor or health care professional if they continue or are bothersome): constipation or diarrhea loss of appetite nausea, vomiting This list may not describe all possible side effects. Call your doctor for medical advice about side effects. You may report side effects to FDA at  1-800-FDA-1088. Where should I keep my medication? This drug is given in a hospital or clinic and will not be stored at home. NOTE: This sheet is a summary. It may not cover all possible information. If you have questions about this medicine, talk to your doctor, pharmacist, or health care provider.  2022 Elsevier/Gold Standard (2007-10-05 00:00:00)  Fluorouracil, 5-FU injection What is this medication? FLUOROURACIL, 5-FU (flure oh YOOR a sil) is a chemotherapy drug. It slows the growth of cancer cells. This medicine is used to treat many types of cancer like breast cancer, colon or rectal cancer, pancreatic cancer, and stomach cancer. This medicine may be used for other purposes; ask your health care provider or pharmacist if you have questions. COMMON BRAND NAME(S): Adrucil What should I tell my care team  before I take this medication? They need to know if you have any of these conditions: blood disorders dihydropyrimidine dehydrogenase (DPD) deficiency infection (especially a virus infection such as chickenpox, cold sores, or herpes) kidney disease liver disease malnourished, poor nutrition recent or ongoing radiation therapy an unusual or allergic reaction to fluorouracil, other chemotherapy, other medicines, foods, dyes, or preservatives pregnant or trying to get pregnant breast-feeding How should I use this medication? This drug is given as an infusion or injection into a vein. It is administered in a hospital or clinic by a specially trained health care professional. Talk to your pediatrician regarding the use of this medicine in children. Special care may be needed. Overdosage: If you think you have taken too much of this medicine contact a poison control center or emergency room at once. NOTE: This medicine is only for you. Do not share this medicine with others. What if I miss a dose? It is important not to miss your dose. Call your doctor or health care professional if you  are unable to keep an appointment. What may interact with this medication? Do not take this medicine with any of the following medications: live virus vaccines This medicine may also interact with the following medications: medicines that treat or prevent blood clots like warfarin, enoxaparin, and dalteparin This list may not describe all possible interactions. Give your health care provider a list of all the medicines, herbs, non-prescription drugs, or dietary supplements you use. Also tell them if you smoke, drink alcohol, or use illegal drugs. Some items may interact with your medicine. What should I watch for while using this medication? Visit your doctor for checks on your progress. This drug may make you feel generally unwell. This is not uncommon, as chemotherapy can affect healthy cells as well as cancer cells. Report any side effects. Continue your course of treatment even though you feel ill unless your doctor tells you to stop. In some cases, you may be given additional medicines to help with side effects. Follow all directions for their use. Call your doctor or health care professional for advice if you get a fever, chills or sore throat, or other symptoms of a cold or flu. Do not treat yourself. This drug decreases your body's ability to fight infections. Try to avoid being around people who are sick. This medicine may increase your risk to bruise or bleed. Call your doctor or health care professional if you notice any unusual bleeding. Be careful brushing and flossing your teeth or using a toothpick because you may get an infection or bleed more easily. If you have any dental work done, tell your dentist you are receiving this medicine. Avoid taking products that contain aspirin, acetaminophen, ibuprofen, naproxen, or ketoprofen unless instructed by your doctor. These medicines may hide a fever. Do not become pregnant while taking this medicine. Women should inform their doctor if they  wish to become pregnant or think they might be pregnant. There is a potential for serious side effects to an unborn child. Talk to your health care professional or pharmacist for more information. Do not breast-feed an infant while taking this medicine. Men should inform their doctor if they wish to father a child. This medicine may lower sperm counts. Do not treat diarrhea with over the counter products. Contact your doctor if you have diarrhea that lasts more than 2 days or if it is severe and watery. This medicine can make you more sensitive to the sun. Keep out of the  sun. If you cannot avoid being in the sun, wear protective clothing and use sunscreen. Do not use sun lamps or tanning beds/booths. What side effects may I notice from receiving this medication? Side effects that you should report to your doctor or health care professional as soon as possible: allergic reactions like skin rash, itching or hives, swelling of the face, lips, or tongue low blood counts - this medicine may decrease the number of white blood cells, red blood cells and platelets. You may be at increased risk for infections and bleeding. signs of infection - fever or chills, cough, sore throat, pain or difficulty passing urine signs of decreased platelets or bleeding - bruising, pinpoint red spots on the skin, black, tarry stools, blood in the urine signs of decreased red blood cells - unusually weak or tired, fainting spells, lightheadedness breathing problems changes in vision chest pain mouth sores nausea and vomiting pain, swelling, redness at site where injected pain, tingling, numbness in the hands or feet redness, swelling, or sores on hands or feet stomach pain unusual bleeding Side effects that usually do not require medical attention (report to your doctor or health care professional if they continue or are bothersome): changes in finger or toe nails diarrhea dry or itchy skin hair loss headache loss  of appetite sensitivity of eyes to the light stomach upset unusually teary eyes This list may not describe all possible side effects. Call your doctor for medical advice about side effects. You may report side effects to FDA at 1-800-FDA-1088. Where should I keep my medication? This drug is given in a hospital or clinic and will not be stored at home. NOTE: This sheet is a summary. It may not cover all possible information. If you have questions about this medicine, talk to your doctor, pharmacist, or health care provider.  2022 Elsevier/Gold Standard (2020-12-16 00:00:00)

## 2021-05-26 NOTE — Progress Notes (Signed)
Patient presents for treatment. RN assessment completed along with the following:  Labs/vitals reviewed - Yes, and ok to treat with K 3.4    Weight within 10% of previous measurement - Yes Informed consent completed and reflects current therapy/intent - Yes, on date 02/16/21             Provider progress note reviewed - Yes, today's provider note was reviewed. Treatment/Antibody/Supportive plan reviewed - Yes, and there are no adjustments needed for today's treatment. S&H and other orders reviewed - Yes, and there are no additional orders identified. Previous treatment date reviewed - Yes, and the appropriate amount of time has elapsed between treatments. Clinic Hand Off Received from - Doristine Section, RN  Patient to proceed with treatment.

## 2021-05-26 NOTE — Progress Notes (Signed)
[facility] OFFICE PROGRESS NOTE   Diagnosis: Colon cancer  INTERVAL HISTORY:   Sabrina [name] returns as scheduled.  She completed cycle 7 FOLFIRI 05/12/2021.  She denies nausea/vomiting.  Bowels continue to alternate constipation/diarrhea.  Continued neuropathy symptoms, painful at nighttime.  She tried Neurontin many months ago.  Objective:  Vital signs in last 24 hours:  Blood pressure (!) 149/76, pulse 65, temperature 98.1 F (36.7 C), temperature source Oral, resp. rate 18, height 5' 10" (1.778 m), weight 133 lb 9.6 oz (60.6 kg), SpO2 98 %.    HEENT: No thrush or ulcers. Resp: Distant breath sounds.  Few scattered wheezes.  No respiratory distress. Cardio: Regular rate and rhythm. GI: Abdomen soft and nontender.  No hepatosplenomegaly. Vascular: No leg edema. Skin: Palms without erythema. Port-A-Cath without erythema.   Lab Results:  Lab Results  Component Value Date   WBC 9.4 05/26/2021   HGB 12.5 05/26/2021   HCT 39.6 05/26/2021   MCV 93.8 05/26/2021   PLT 105 (L) 05/26/2021   NEUTROABS 8.3 (H) 05/26/2021    Imaging:  No results found.  Medications: I have reviewed the patient's current medications.  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, lymphovascular invasion present including large vessel extramural invasion, loss of MLH1 and PMS2, MLH1 hyper methylation present Incomplete colonoscopy secondary to stricturing at the sigmoid colon Virtual colonoscopy 03/13/2020- mass centered at the medial wall of the cecum and terminal ileum, adjacent ileocolic mesenteric adenopathy, nonspecific 3 mm right lower lobe nodule PET 03/55/9741 hypermetabolic cecal/terminal ileum mass with adjacent enlarged and hypermetabolic ileocolic lymph nodes, previously noted segment 7 liver lesion-not hypermetabolic- benign etiology favored, dominant  left lower lobe pulmonary nodule not hypermetabolic, other tiny nodules not hypermetabolic and below PET sensitivity Cycle 1 FOLFOX 05/12/2020 Cycle 2 FOLFOX 05/26/2020 Cycle 3 FOLFOX 06/09/2020 Cycle 4 FOLFOX 06/23/2020 Cycle 5 FOLFOX 07/07/2020 Cycle 12 FOLFOX 10/14/2020, oxaliplatin dose reduced beginning with cycle 9 CT 12/04/2020-development of peripheral enhancing hypoattenuating mass in segment 5/8, necrotic porta hepatis lymphadenopathy, similar appearance of previously visualized nonhypermetabolic subcapsular segment 7 lesion Ultrasound-guided biopsy right liver lesion 12/22/2020-adenocarcinoma consistent with a colon primary PET scan 12/30/2020-hypermetabolic hepatic lesion and portal adenopathy-increased in size compared to 12/04/2020 CT, small hypermetabolic left periaortic node Foundation 1-MSI high, tumor mutation burden 31, K-ras wild-type Cycle 1 Pembrolizumab 01/15/2021 Cycle 1 FOLFIRI 02/16/2021 Cycle 2 FOLFIRI 03/02/2021-Emend and Udenyca added Cycle 3 FOLFIRI 03/17/2021 Cycle 4 FOLFIRI 03/31/2021 Cycle 5 FOLFIRI 04/15/2021 CT abdomen/pelvis 04/23/2021-mild decrease in size of right hepatic lobe metastases, mild decrease in porta hepatis and portacaval lymphadenopathy, mild increase in left periaortic lymphadenopathy Cycle 6 FOLFIRI 04/28/2021 Cycle 7 FOLFIRI 05/12/2021 Cycle 8 FOLFIRI 05/26/2021 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 appears unchanged.  She has completed 7 cycles of FOLFIRI.   Plan to proceed with cycle 8 today as scheduled.  Restaging CTs after cycle 9.  CBC from today reviewed.  Counts adequate to proceed as above.  She has a history of Oxaliplatin neuropathy.  She notices "aching" discomfort in her legs at nighttime.  She has  Neurontin in the past but discontinued it several months ago.  She will try Neurontin 300 mg at bedtime. ° °She will return for lab, follow-up, chemotherapy in 2 weeks.  She will contact the office in the interim with any problems. ° ° ° °Lisa Thomas ANP/GNP-BC  ° °05/26/2021  °10:53 AM ° ° ° ° ° ° ° °

## 2021-05-26 NOTE — Progress Notes (Signed)
Patient seen by Ned Card NP today  Vitals are within treatment parameters.  Labs reviewed by Ned Card NP and are within treatment parameters. Per Ned Card, NP, ok to treat with potassium of 3.4 mmol/L  Per physician team, patient is ready for treatment and there are NO modifications to the treatment plan.

## 2021-05-27 ENCOUNTER — Encounter: Payer: Self-pay | Admitting: Licensed Clinical Social Worker

## 2021-05-27 ENCOUNTER — Telehealth: Payer: Self-pay

## 2021-05-27 NOTE — Telephone Encounter (Signed)
Called and spoke with the patient to let her know Duard Larsen, NP said she needs to come in to due her lab (TSH, A1C) at [facility] and the  provider was not going to send any order for lab to be draw at the [facility]. Patient voiced understanding of instruction and had no further questions or concerns at this time.

## 2021-05-27 NOTE — Progress Notes (Signed)
[facility] Work  Clinical Social Work was referred by Helaine Chess for assessment of psychosocial needs.  Clinical Social Worker contacted patient by phone  to offer support and assess for needs.  CSW left voicemail with contact information and request for return call.   Adelene Amas, [place]  First Attempt

## 2021-05-27 NOTE — Progress Notes (Signed)
[facility] Clinical Social Work  Initial Assessment   Sabrina [name] is a 71 y.o. year old female contacted by phone. Clinical Social Work was referred by Network engineer of psychosocial needs.   SDOH (Social Determinants of Health) assessments performed: Yes   Distress Screen completed: No ONCBCN DISTRESS SCREENING 07/17/2018  Screening Type Initial Screening  Distress experienced in past week (1-10) 3  Emotional problem type Nervousness/Anxiety  Referral to support programs Yes      Family/Social Information:  Housing Arrangement: patient lives al[place] lives  miles away Family members/support persons in your life? Family, Friends/Colleagues, Church, and [employer] concerns: no  Employment: Retired. Income source: Paediatric nurse concerns: No Type of concern: None Food access concerns: no Religious or spiritual practice: yes Services Currently in place:  Medicare/[employer] Ins Supplement  Coping/ Adjustment to diagnosis: Patient understands treatment plan and what happens next? yes Concerns about diagnosis and/or treatment: Pain or discomfort during procedures Patient reported stressors: Adjusting to my illness Hopes and priorities: Hoping to feel better soon Patient enjoys being outside, watching TV, and time with family/ friends Current coping skills/ strengths: Ability for insight , Active sense of humor , Average or above average intelligence , Capable of independent living , Communication skills , Scientist, research (life sciences) , Motivation for treatment/growth , Physical Health , and Supportive family/friends     SUMMARY: Current SDOH Barriers:  Patient has varied support system which includes family and friends.  Clinical Social Work Clinical Goal(s):  CSW will continue to follow-up with patient to address illness adjustment concerns  Interventions: Discussed common feeling and emotions when being diagnosed with cancer, and the importance  of support during treatment Informed patient of the support team roles and support services at [facility] Provided CSW contact information and encouraged patient to call with any questions or concerns Provided patient with information about CSW role in patient care and resources   Follow Up Plan: Patient will contact CSW with any support or resource needs Patient verbalizes understanding of plan: Yes    Adelene Amas , LCSW

## 2021-05-28 ENCOUNTER — Other Ambulatory Visit: Payer: Self-pay

## 2021-05-28 ENCOUNTER — Inpatient Hospital Stay: Payer: Medicare Other

## 2021-05-28 VITALS — BP 123/69 | HR 79 | Resp 18

## 2021-05-28 DIAGNOSIS — C50411 Malignant neoplasm of upper-outer quadrant of right female breast: Secondary | ICD-10-CM

## 2021-05-28 DIAGNOSIS — Z170 Estrogen receptor positive status [ER+]: Secondary | ICD-10-CM

## 2021-05-28 DIAGNOSIS — C787 Secondary malignant neoplasm of liver and intrahepatic bile duct: Secondary | ICD-10-CM

## 2021-05-28 DIAGNOSIS — C189 Malignant neoplasm of colon, unspecified: Secondary | ICD-10-CM

## 2021-05-28 DIAGNOSIS — Z5111 Encounter for antineoplastic chemotherapy: Secondary | ICD-10-CM | POA: Diagnosis not present

## 2021-05-28 MED ORDER — PEGFILGRASTIM-CBQV 6 MG/0.6ML ~~LOC~~ SOSY
6.0000 mg | PREFILLED_SYRINGE | Freq: Once | SUBCUTANEOUS | Status: AC
  Administered 2021-05-28: 6 mg via SUBCUTANEOUS
  Filled 2021-05-28: qty 0.6

## 2021-05-28 MED ORDER — HEPARIN SOD (PORK) LOCK FLUSH 100 UNIT/ML IV SOLN
500.0000 [IU] | Freq: Once | INTRAVENOUS | Status: AC | PRN
  Administered 2021-05-28: 500 [IU]

## 2021-05-28 MED ORDER — SODIUM CHLORIDE 0.9% FLUSH
10.0000 mL | INTRAVENOUS | Status: DC | PRN
  Administered 2021-05-28: 10 mL

## 2021-05-28 NOTE — Patient Instructions (Signed)

## 2021-06-05 ENCOUNTER — Other Ambulatory Visit: Payer: Self-pay

## 2021-06-05 ENCOUNTER — Other Ambulatory Visit: Payer: Self-pay | Admitting: Cardiology

## 2021-06-05 MED ORDER — WARFARIN SODIUM 10 MG PO TABS
ORAL_TABLET | ORAL | 3 refills | Status: AC
Start: 2021-06-05 — End: ?

## 2021-06-05 NOTE — Telephone Encounter (Signed)
° ° °*  STAT* If patient is at the pharmacy, call can be transferred to refill team.   1. Which medications need to be refilled? (please list name of each medication and dose if known) warfarin (COUMADIN) 10 MG tablet  2. Which pharmacy/location (including street and city if local pharmacy) is medication to be sent to?[facility], [address]  Fax:  [phone]    3. Do they need a 30 day or 90 day supply? 90 days  Pt is almost out of meds and needs refill today

## 2021-06-07 ENCOUNTER — Other Ambulatory Visit: Payer: Self-pay | Admitting: Oncology

## 2021-06-09 ENCOUNTER — Inpatient Hospital Stay: Payer: Medicare Other

## 2021-06-09 ENCOUNTER — Inpatient Hospital Stay (HOSPITAL_BASED_OUTPATIENT_CLINIC_OR_DEPARTMENT_OTHER): Payer: Medicare Other | Admitting: Oncology

## 2021-06-09 ENCOUNTER — Encounter: Payer: Self-pay | Admitting: Licensed Clinical Social Worker

## 2021-06-09 ENCOUNTER — Other Ambulatory Visit: Payer: Self-pay

## 2021-06-09 ENCOUNTER — Encounter: Payer: Self-pay | Admitting: *Deleted

## 2021-06-09 VITALS — BP 143/74 | HR 83 | Temp 98.2°F | Resp 20 | Ht 70.0 in | Wt 136.8 lb

## 2021-06-09 VITALS — BP 147/61 | HR 73 | Resp 18

## 2021-06-09 DIAGNOSIS — C189 Malignant neoplasm of colon, unspecified: Secondary | ICD-10-CM | POA: Diagnosis not present

## 2021-06-09 DIAGNOSIS — C50411 Malignant neoplasm of upper-outer quadrant of right female breast: Secondary | ICD-10-CM

## 2021-06-09 DIAGNOSIS — C787 Secondary malignant neoplasm of liver and intrahepatic bile duct: Secondary | ICD-10-CM | POA: Diagnosis not present

## 2021-06-09 DIAGNOSIS — Z5111 Encounter for antineoplastic chemotherapy: Secondary | ICD-10-CM | POA: Diagnosis not present

## 2021-06-09 LAB — CBC WITH DIFFERENTIAL (CANCER CENTER ONLY)
Abs Immature Granulocytes: 0.06 10*3/uL (ref 0.00–0.07)
Basophils Absolute: 0.1 10*3/uL (ref 0.0–0.1)
Basophils Relative: 1 %
Eosinophils Absolute: 0.1 10*3/uL (ref 0.0–0.5)
Eosinophils Relative: 1 %
HCT: 37.7 % (ref 36.0–46.0)
Hemoglobin: 11.7 g/dL — ABNORMAL LOW (ref 12.0–15.0)
Immature Granulocytes: 1 %
Lymphocytes Relative: 6 %
Lymphs Abs: 0.6 10*3/uL — ABNORMAL LOW (ref 0.7–4.0)
MCH: 29.1 pg (ref 26.0–34.0)
MCHC: 31 g/dL (ref 30.0–36.0)
MCV: 93.8 fL (ref 80.0–100.0)
Monocytes Absolute: 0.7 10*3/uL (ref 0.1–1.0)
Monocytes Relative: 7 %
Neutro Abs: 8.5 10*3/uL — ABNORMAL HIGH (ref 1.7–7.7)
Neutrophils Relative %: 84 %
Platelet Count: 101 10*3/uL — ABNORMAL LOW (ref 150–400)
RBC: 4.02 MIL/uL (ref 3.87–5.11)
RDW: 16.9 % — ABNORMAL HIGH (ref 11.5–15.5)
WBC Count: 10 10*3/uL (ref 4.0–10.5)
nRBC: 0 % (ref 0.0–0.2)

## 2021-06-09 LAB — CMP (CANCER CENTER ONLY)
ALT: 11 U/L (ref 0–44)
AST: 18 U/L (ref 15–41)
Albumin: 4.1 g/dL (ref 3.5–5.0)
Alkaline Phosphatase: 181 U/L — ABNORMAL HIGH (ref 38–126)
Anion gap: 7 (ref 5–15)
BUN: 9 mg/dL (ref 8–23)
CO2: 32 mmol/L (ref 22–32)
Calcium: 9.3 mg/dL (ref 8.9–10.3)
Chloride: 102 mmol/L (ref 98–111)
Creatinine: 0.44 mg/dL (ref 0.44–1.00)
GFR, Estimated: >60 mL/min (ref >60)
Glucose, Bld: 177 mg/dL — ABNORMAL HIGH (ref 70–99)
Potassium: 3.5 mmol/L (ref 3.5–5.1)
Sodium: 141 mmol/L (ref 135–145)
Total Bilirubin: 0.3 mg/dL (ref 0.3–1.2)
Total Protein: 6.2 g/dL — ABNORMAL LOW (ref 6.5–8.1)

## 2021-06-09 LAB — PROTIME-INR
INR: 2 — ABNORMAL HIGH (ref 0.8–1.2)
Prothrombin Time: 22.6 seconds — ABNORMAL HIGH (ref 11.4–15.2)

## 2021-06-09 MED ORDER — SODIUM CHLORIDE 0.9 % IV SOLN
150.0000 mg | Freq: Once | INTRAVENOUS | Status: AC
  Administered 2021-06-09: 150 mg via INTRAVENOUS
  Filled 2021-06-09: qty 5

## 2021-06-09 MED ORDER — SODIUM CHLORIDE 0.9 % IV SOLN
180.0000 mg/m2 | Freq: Once | INTRAVENOUS | Status: AC
  Administered 2021-06-09: 300 mg via INTRAVENOUS
  Filled 2021-06-09: qty 15

## 2021-06-09 MED ORDER — FLUOROURACIL CHEMO INJECTION 2.5 GM/50ML
400.0000 mg/m2 | Freq: Once | INTRAVENOUS | Status: AC
  Administered 2021-06-09: 650 mg via INTRAVENOUS
  Filled 2021-06-09: qty 13

## 2021-06-09 MED ORDER — SODIUM CHLORIDE 0.9 % IV SOLN
2400.0000 mg/m2 | INTRAVENOUS | Status: DC
  Administered 2021-06-09: 4000 mg via INTRAVENOUS
  Filled 2021-06-09: qty 80

## 2021-06-09 MED ORDER — ATROPINE SULFATE 1 MG/ML IV SOLN
0.5000 mg | Freq: Once | INTRAVENOUS | Status: AC | PRN
  Administered 2021-06-09: 0.5 mg via INTRAVENOUS
  Filled 2021-06-09: qty 1

## 2021-06-09 MED ORDER — SODIUM CHLORIDE 0.9 % IV SOLN
10.0000 mg | Freq: Once | INTRAVENOUS | Status: AC
  Administered 2021-06-09: 10 mg via INTRAVENOUS
  Filled 2021-06-09: qty 1

## 2021-06-09 MED ORDER — SODIUM CHLORIDE 0.9 % IV SOLN: Freq: Once | INTRAVENOUS | Status: AC

## 2021-06-09 MED ORDER — SODIUM CHLORIDE 0.9 % IV SOLN
400.0000 mg/m2 | Freq: Once | INTRAVENOUS | Status: AC
  Administered 2021-06-09: 668 mg via INTRAVENOUS
  Filled 2021-06-09: qty 33.4

## 2021-06-09 MED ORDER — PALONOSETRON HCL INJECTION 0.25 MG/5ML
0.2500 mg | Freq: Once | INTRAVENOUS | Status: AC
  Administered 2021-06-09: 0.25 mg via INTRAVENOUS
  Filled 2021-06-09: qty 5

## 2021-06-09 NOTE — Progress Notes (Signed)
Patient presents for treatment. RN assessment completed along with the following:  Labs/vitals reviewed - Yes, and within treatment parameters.   Weight within 10% of previous measurement - Yes Informed consent completed and reflects current therapy/intent - Yes, on date 02/16/21             Provider progress note reviewed - Today's provider note is not yet available. I reviewed the most recent oncology provider progress note in chart dated 05/26/21. Treatment/Antibody/Supportive plan reviewed - Yes, and there are no adjustments needed for today's treatment. S&H and other orders reviewed - Yes, and there are no additional orders identified. Previous treatment date reviewed - Yes, and the appropriate amount of time has elapsed between treatments. Clinic Hand Off Received from - Merceda Elks, RN  Patient to proceed with treatment.

## 2021-06-09 NOTE — Patient Instructions (Signed)

## 2021-06-09 NOTE — Progress Notes (Signed)
[facility] Work  Clinical Social Work was referred by Art therapist for assessment of psychosocial needs.  Clinical Social Worker contacted patient by phone  to offer support and assess for needs.  CSW called to follow-up on previous conversation.  CSW left voicemail with contact information and request for a return call.   Adelene Amas, [place]       Follow-up call

## 2021-06-09 NOTE — Patient Instructions (Addendum)
[place] The chemotherapy medication bag should finish at 46 hours, 96 hours, or 7 days. For example, if your pump is scheduled for 46 hours and it was put on at 4:00 p.m., it should finish at 2:00 p.m. the day it is scheduled to come off regardless of your appointment time.     Estimated time to finish at 12:15pm Thursday, June 11, 2021.   If the display on your pump reads "Low Volume" and it is beeping, take the batteries out of the pump and come to the cancer center for it to be taken off.   If the pump alarms go off prior to the pump reading "Low Volume" then call [phone] and someone can assist you.  If the plunger comes out and the chemotherapy medication is leaking out, please use your home chemo spill kit to clean up the spill. Do NOT use paper towels or other household products.  If you have problems or questions regarding your pump, please call either [phone] (24 hours a day) or the cancer center Monday-Friday 8:00 a.m.- 4:30 p.m. at the clinic number and we will assist you. If you are unable to get assistance, then go to the nearest Emergency Department and ask the staff to contact the IV team for assistance.    Discharge Instructions: Thank you for choosing [facility] to provide your oncology and hematology care.   If you have a lab appointment with the [facility], please go directly to the [facility] and check in at the registration area.   Wear comfortable clothing and clothing appropriate for easy access to any Portacath or PICC line.   We strive to give you quality time with your provider. You may need to reschedule your appointment if you arrive late (15 or more minutes).  Arriving late affects you and other patients whose appointments are after yours.  Also, if you miss three or more appointments without notifying the office, you may be dismissed from the clinic at the providers discretion.      For prescription  refill requests, have your pharmacy contact our office and allow 72 hours for refills to be completed.    Today you received the following chemotherapy and/or immunotherapy agents irinotecan, leucovorin, fluorouracil      To help prevent nausea and vomiting after your treatment, we encourage you to take your nausea medication as directed.  BELOW ARE SYMPTOMS THAT SHOULD BE REPORTED IMMEDIATELY: *FEVER GREATER THAN 100.4 F (38 C) OR HIGHER *CHILLS OR SWEATING *NAUSEA AND VOMITING THAT IS NOT CONTROLLED WITH YOUR NAUSEA MEDICATION *UNUSUAL SHORTNESS OF BREATH *UNUSUAL BRUISING OR BLEEDING *URINARY PROBLEMS (pain or burning when urinating, or frequent urination) *BOWEL PROBLEMS (unusual diarrhea, constipation, pain near the anus) TENDERNESS IN MOUTH AND THROAT WITH OR WITHOUT PRESENCE OF ULCERS (sore throat, sores in mouth, or a toothache) UNUSUAL RASH, SWELLING OR PAIN  UNUSUAL VAGINAL DISCHARGE OR ITCHING   Items with * indicate a potential emergency and should be followed up as soon as possible or go to the Emergency Department if any problems should occur.  Please show the CHEMOTHERAPY ALERT CARD or IMMUNOTHERAPY ALERT CARD at check-in to the Emergency Department and triage nurse.  Should you have questions after your visit or need to cancel or reschedule your appointment, please contact [facility]  Dept: [phone]  and follow the prompts.  Office hours are 8:00 a.m. to 4:30 p.m. Monday - Friday. Please note that voicemails  left after 4:00 p.m. may not be returned until the following business day.  We are closed weekends and major holidays. You have access to a nurse at all times for urgent questions. Please call the main number to the clinic Dept: [phone] and follow the prompts.   For any non-urgent questions, you may also contact your provider using MyChart. We now offer e-Visits for anyone 61 and older to request care online for non-urgent symptoms.  For details visit [email or web address].   Also download the MyChart app! Go to the app store, search "MyChart", open the app, select [facility], and log in with your MyChart username and password.  Due to Covid, a mask is required upon entering the hospital/clinic. If you do not have a mask, one will be given to you upon arrival. For doctor visits, patients may have 1 support person aged 69 or older with them. For treatment visits, patients cannot have anyone with them due to current Covid guidelines and our immunocompromised population.   Irinotecan injection What is this medication? IRINOTECAN (ir in oh TEE kan ) is a chemotherapy drug. It is used to treat colon and rectal cancer. This medicine may be used for other purposes; ask your health care provider or pharmacist if you have questions. COMMON BRAND NAME(S): Camptosar What should I tell my care team before I take this medication? They need to know if you have any of these conditions: dehydration diarrhea infection (especially a virus infection such as chickenpox, cold sores, or herpes) liver disease low blood counts, like low white cell, platelet, or red cell counts low levels of calcium, magnesium, or potassium in the blood recent or ongoing radiation therapy an unusual or allergic reaction to irinotecan, other medicines, foods, dyes, or preservatives pregnant or trying to get pregnant breast-feeding How should I use this medication? This drug is given as an infusion into a vein. It is administered in a hospital or clinic by a specially trained health care professional. Talk to your pediatrician regarding the use of this medicine in children. Special care may be needed. Overdosage: If you think you have taken too much of this medicine contact a poison control center or emergency room at once. NOTE: This medicine is only for you. Do not share this medicine with others. What if I miss a dose? It is important not to miss your  dose. Call your doctor or health care professional if you are unable to keep an appointment. What may interact with this medication? Do not take this medicine with any of the following medications: cobicistat itraconazole This medicine may interact with the following medications: antiviral medicines for HIV or AIDS certain antibiotics like rifampin or rifabutin certain medicines for fungal infections like ketoconazole, posaconazole, and voriconazole certain medicines for seizures like carbamazepine, phenobarbital, phenotoin clarithromycin gemfibrozil nefazodone [facility]. John's Wort This list may not describe all possible interactions. Give your health care provider a list of all the medicines, herbs, non-prescription drugs, or dietary supplements you use. Also tell them if you smoke, drink alcohol, or use illegal drugs. Some items may interact with your medicine. What should I watch for while using this medication? Your condition will be monitored carefully while you are receiving this medicine. You will need important blood work done while you are taking this medicine. This drug may make you feel generally unwell. This is not uncommon, as chemotherapy can affect healthy cells as well as cancer cells. Report any side effects. Continue your course of treatment even though you  feel ill unless your doctor tells you to stop. In some cases, you may be given additional medicines to help with side effects. Follow all directions for their use. You may get drowsy or dizzy. Do not drive, use machinery, or do anything that needs mental alertness until you know how this medicine affects you. Do not stand or sit up quickly, especially if you are an older patient. This reduces the risk of dizzy or fainting spells. Call your health care professional for advice if you get a fever, chills, or sore throat, or other symptoms of a cold or flu. Do not treat yourself. This medicine decreases your body's ability to fight  infections. Try to avoid being around people who are sick. Avoid taking products that contain aspirin, acetaminophen, ibuprofen, naproxen, or ketoprofen unless instructed by your doctor. These medicines may hide a fever. This medicine may increase your risk to bruise or bleed. Call your doctor or health care professional if you notice any unusual bleeding. Be careful brushing and flossing your teeth or using a toothpick because you may get an infection or bleed more easily. If you have any dental work done, tell your dentist you are receiving this medicine. Do not become pregnant while taking this medicine or for 6 months after stopping it. Women should inform their health care professional if they wish to become pregnant or think they might be pregnant. Men should not father a child while taking this medicine and for 3 months after stopping it. There is potential for serious side effects to an unborn child. Talk to your health care professional for more information. Do not breast-feed an infant while taking this medicine or for 7 days after stopping it. This medicine has caused ovarian failure in some women. This medicine may make it more difficult to get pregnant. Talk to your health care professional if you are concerned about your fertility. This medicine has caused decreased sperm counts in some men. This may make it more difficult to father a child. Talk to your health care professional if you are concerned about your fertility. What side effects may I notice from receiving this medication? Side effects that you should report to your doctor or health care professional as soon as possible: allergic reactions like skin rash, itching or hives, swelling of the face, lips, or tongue chest pain diarrhea flushing, runny nose, sweating during infusion low blood counts - this medicine may decrease the number of white blood cells, red blood cells and platelets. You may be at increased risk for infections  and bleeding. nausea, vomiting pain, swelling, warmth in the leg signs of decreased platelets or bleeding - bruising, pinpoint red spots on the skin, black, tarry stools, blood in the urine signs of infection - fever or chills, cough, sore throat, pain or difficulty passing urine signs of decreased red blood cells - unusually weak or tired, fainting spells, lightheadedness Side effects that usually do not require medical attention (report to your doctor or health care professional if they continue or are bothersome): constipation hair loss headache loss of appetite mouth sores stomach pain This list may not describe all possible side effects. Call your doctor for medical advice about side effects. You may report side effects to FDA at 1-800-FDA-1088. Where should I keep my medication? This drug is given in a hospital or clinic and will not be stored at home. NOTE: This sheet is a summary. It may not cover all possible information. If you have questions about this medicine,  talk to your doctor, pharmacist, or health care provider.  2022 [employer]/Gold Standard (2020-12-16 00:00:00)  Leucovorin injection What is this medication? LEUCOVORIN (loo koe VOR in) is used to prevent or treat the harmful effects of some medicines. This medicine is used to treat anemia caused by a low amount of folic acid in the body. It is also used with 5-fluorouracil (5-FU) to treat colon cancer. This medicine may be used for other purposes; ask your health care provider or pharmacist if you have questions. What should I tell my care team before I take this medication? They need to know if you have any of these conditions: anemia from low levels of vitamin B-12 in the blood an unusual or allergic reaction to leucovorin, folic acid, other medicines, foods, dyes, or preservatives pregnant or trying to get pregnant breast-feeding How should I use this medication? This medicine is for injection into a muscle or into  a vein. It is given by a health care professional in a hospital or clinic setting. Talk to your pediatrician regarding the use of this medicine in children. Special care may be needed. Overdosage: If you think you have taken too much of this medicine contact a poison control center or emergency room at once. NOTE: This medicine is only for you. Do not share this medicine with others. What if I miss a dose? This does not apply. What may interact with this medication? capecitabine fluorouracil phenobarbital phenytoin primidone trimethoprim-sulfamethoxazole This list may not describe all possible interactions. Give your health care provider a list of all the medicines, herbs, non-prescription drugs, or dietary supplements you use. Also tell them if you smoke, drink alcohol, or use illegal drugs. Some items may interact with your medicine. What should I watch for while using this medication? Your condition will be monitored carefully while you are receiving this medicine. This medicine may increase the side effects of 5-fluorouracil, 5-FU. Tell your doctor or health care professional if you have diarrhea or mouth sores that do not get better or that get worse. What side effects may I notice from receiving this medication? Side effects that you should report to your doctor or health care professional as soon as possible: allergic reactions like skin rash, itching or hives, swelling of the face, lips, or tongue breathing problems fever, infection mouth sores unusual bleeding or bruising unusually weak or tired Side effects that usually do not require medical attention (report to your doctor or health care professional if they continue or are bothersome): constipation or diarrhea loss of appetite nausea, vomiting This list may not describe all possible side effects. Call your doctor for medical advice about side effects. You may report side effects to FDA at 1-800-FDA-1088. Where should I keep  my medication? This drug is given in a hospital or clinic and will not be stored at home. NOTE: This sheet is a summary. It may not cover all possible information. If you have questions about this medicine, talk to your doctor, pharmacist, or health care provider.  2022 Elsevier/Gold Standard (2007-10-05 00:00:00)  Fluorouracil, 5-FU injection What is this medication? FLUOROURACIL, 5-FU (flure oh YOOR a sil) is a chemotherapy drug. It slows the growth of cancer cells. This medicine is used to treat many types of cancer like breast cancer, colon or rectal cancer, pancreatic cancer, and stomach cancer. This medicine may be used for other purposes; ask your health care provider or pharmacist if you have questions. COMMON BRAND NAME(S): Adrucil What should I tell my care team  before I take this medication? They need to know if you have any of these conditions: blood disorders dihydropyrimidine dehydrogenase (DPD) deficiency infection (especially a virus infection such as chickenpox, cold sores, or herpes) kidney disease liver disease malnourished, poor nutrition recent or ongoing radiation therapy an unusual or allergic reaction to fluorouracil, other chemotherapy, other medicines, foods, dyes, or preservatives pregnant or trying to get pregnant breast-feeding How should I use this medication? This drug is given as an infusion or injection into a vein. It is administered in a hospital or clinic by a specially trained health care professional. Talk to your pediatrician regarding the use of this medicine in children. Special care may be needed. Overdosage: If you think you have taken too much of this medicine contact a poison control center or emergency room at once. NOTE: This medicine is only for you. Do not share this medicine with others. What if I miss a dose? It is important not to miss your dose. Call your doctor or health care professional if you are unable to keep an  appointment. What may interact with this medication? Do not take this medicine with any of the following medications: live virus vaccines This medicine may also interact with the following medications: medicines that treat or prevent blood clots like warfarin, enoxaparin, and dalteparin This list may not describe all possible interactions. Give your health care provider a list of all the medicines, herbs, non-prescription drugs, or dietary supplements you use. Also tell them if you smoke, drink alcohol, or use illegal drugs. Some items may interact with your medicine. What should I watch for while using this medication? Visit your doctor for checks on your progress. This drug may make you feel generally unwell. This is not uncommon, as chemotherapy can affect healthy cells as well as cancer cells. Report any side effects. Continue your course of treatment even though you feel ill unless your doctor tells you to stop. In some cases, you may be given additional medicines to help with side effects. Follow all directions for their use. Call your doctor or health care professional for advice if you get a fever, chills or sore throat, or other symptoms of a cold or flu. Do not treat yourself. This drug decreases your body's ability to fight infections. Try to avoid being around people who are sick. This medicine may increase your risk to bruise or bleed. Call your doctor or health care professional if you notice any unusual bleeding. Be careful brushing and flossing your teeth or using a toothpick because you may get an infection or bleed more easily. If you have any dental work done, tell your dentist you are receiving this medicine. Avoid taking products that contain aspirin, acetaminophen, ibuprofen, naproxen, or ketoprofen unless instructed by your doctor. These medicines may hide a fever. Do not become pregnant while taking this medicine. Women should inform their doctor if they wish to become pregnant  or think they might be pregnant. There is a potential for serious side effects to an unborn child. Talk to your health care professional or pharmacist for more information. Do not breast-feed an infant while taking this medicine. Men should inform their doctor if they wish to father a child. This medicine may lower sperm counts. Do not treat diarrhea with over the counter products. Contact your doctor if you have diarrhea that lasts more than 2 days or if it is severe and watery. This medicine can make you more sensitive to the sun. Keep out of the  sun. If you cannot avoid being in the sun, wear protective clothing and use sunscreen. Do not use sun lamps or tanning beds/booths. What side effects may I notice from receiving this medication? Side effects that you should report to your doctor or health care professional as soon as possible: allergic reactions like skin rash, itching or hives, swelling of the face, lips, or tongue low blood counts - this medicine may decrease the number of white blood cells, red blood cells and platelets. You may be at increased risk for infections and bleeding. signs of infection - fever or chills, cough, sore throat, pain or difficulty passing urine signs of decreased platelets or bleeding - bruising, pinpoint red spots on the skin, black, tarry stools, blood in the urine signs of decreased red blood cells - unusually weak or tired, fainting spells, lightheadedness breathing problems changes in vision chest pain mouth sores nausea and vomiting pain, swelling, redness at site where injected pain, tingling, numbness in the hands or feet redness, swelling, or sores on hands or feet stomach pain unusual bleeding Side effects that usually do not require medical attention (report to your doctor or health care professional if they continue or are bothersome): changes in finger or toe nails diarrhea dry or itchy skin hair loss headache loss of appetite sensitivity  of eyes to the light stomach upset unusually teary eyes This list may not describe all possible side effects. Call your doctor for medical advice about side effects. You may report side effects to FDA at 1-800-FDA-1088. Where should I keep my medication? This drug is given in a hospital or clinic and will not be stored at home. NOTE: This sheet is a summary. It may not cover all possible information. If you have questions about this medicine, talk to your doctor, pharmacist, or health care provider.  2022 [employer]vier/Gold Standard (2020-12-16 00:00:00)

## 2021-06-09 NOTE — Progress Notes (Signed)
Patient seen by Dr. Sherrill today ? ?Vitals are within treatment parameters. ? ?Labs reviewed by Dr. Sherrill and are within treatment parameters. ? ?Per physician team, patient is ready for treatment and there are NO modifications to the treatment plan.  ?

## 2021-06-09 NOTE — Progress Notes (Signed)
[facility] OFFICE PROGRESS NOTE   Diagnosis: Colon cancer  INTERVAL HISTORY:   Ms. Shrake completed on cycle FOLFIRI on 05/26/2021.  No nausea/vomiting or mouth sores.  She has intermittent diarrhea, controlled with an over-the-counter antidiarrhea medication.  Good appetite.  She continues to have neuropathy symptoms in the hands and feet.  Objective:  Vital signs in last 24 hours:  Blood pressure (!) 143/74, pulse 83, temperature 98.2 F (36.8 C), temperature source Oral, resp. rate 20, height 5' 10"  (1.778 m), weight 136 lb 12.8 oz (62.1 kg), SpO2 98 %.    HEENT: No thrush or ulcers Resp: Distant breath sounds, scattered inspiratory/expiratory rhonchi, no respiratory distress Cardio: Regular rate and rhythm GI: No hepatosplenomegaly, nontender Vascular: No leg edema    Portacath/PICC-without erythema  Lab Results:  Lab Results  Component Value Date   WBC 10.0 06/09/2021   HGB 11.7 (L) 06/09/2021   HCT 37.7 06/09/2021   MCV 93.8 06/09/2021   PLT 101 (L) 06/09/2021   NEUTROABS 8.5 (H) 06/09/2021    CMP  Lab Results  Component Value Date   NA 141 06/09/2021   K 3.5 06/09/2021   CL 102 06/09/2021   CO2 32 06/09/2021   GLUCOSE 177 (H) 06/09/2021   BUN 9 06/09/2021   CREATININE 0.44 06/09/2021   CALCIUM 9.3 06/09/2021   PROT 6.2 (L) 06/09/2021   ALBUMIN 4.1 06/09/2021   AST 18 06/09/2021   ALT 11 06/09/2021   ALKPHOS 181 (H) 06/09/2021   BILITOT 0.3 06/09/2021   GFRNONAA >60 06/09/2021   GFRAA >60 08/17/2019    Lab Results  Component Value Date   CEA1 1.40 12/16/2020   CEA 1.57 02/16/2021    Lab Results  Component Value Date   INR 2.0 (H) 06/09/2021   LABPROT 22.6 (H) 06/09/2021    Medications: I have reviewed the patient's current medications.   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, stage IIIc pT3pN2b Moderately differentiated adenocarcinoma, 9/30 lymph nodes positive, lymphovascular  invasion present including large vessel extramural invasion, loss of MLH1 and PMS2, MLH1 hyper methylation present Incomplete colonoscopy secondary to stricturing at the sigmoid colon Virtual colonoscopy 03/13/2020- mass centered at the medial wall of the cecum and terminal ileum, adjacent ileocolic mesenteric adenopathy, nonspecific 3 mm right lower lobe nodule PET 66/29/4765 hypermetabolic cecal/terminal ileum mass with adjacent enlarged and hypermetabolic ileocolic lymph nodes, previously noted segment 7 liver lesion-not hypermetabolic- benign etiology favored, dominant left lower lobe pulmonary nodule not hypermetabolic, other tiny nodules not hypermetabolic and below PET sensitivity Cycle 1 FOLFOX 05/12/2020 Cycle 2 FOLFOX 05/26/2020 Cycle 3 FOLFOX 06/09/2020 Cycle 4 FOLFOX 06/23/2020 Cycle 5 FOLFOX 07/07/2020 Cycle 12 FOLFOX 10/14/2020, oxaliplatin dose reduced beginning with cycle 9 CT 12/04/2020-development of peripheral enhancing hypoattenuating mass in segment 5/8, necrotic porta hepatis lymphadenopathy, similar appearance of previously visualized nonhypermetabolic subcapsular segment 7 lesion Ultrasound-guided biopsy right liver lesion 12/22/2020-adenocarcinoma consistent with a colon primary PET scan 12/30/2020-hypermetabolic hepatic lesion and portal adenopathy-increased in size compared to 12/04/2020 CT, small hypermetabolic left periaortic node Foundation 1-MSI high, tumor mutation burden 31, K-ras wild-type Cycle 1 Pembrolizumab 01/15/2021 Cycle 1 FOLFIRI 02/16/2021 Cycle 2 FOLFIRI 03/02/2021-Emend and Udenyca added Cycle 3 FOLFIRI 03/17/2021 Cycle 4 FOLFIRI 03/31/2021 Cycle 5 FOLFIRI 04/15/2021 CT abdomen/pelvis 04/23/2021-mild decrease in size of right hepatic lobe metastases, mild decrease in porta hepatis and portacaval lymphadenopathy, mild increase in left periaortic lymphadenopathy Cycle 6 FOLFIRI 04/28/2021 Cycle 7 FOLFIRI 05/12/2021 Cycle 8 FOLFIRI 05/26/2021 Cycle 9  FOLFIRI  06/09/2021 Iron deficiency anemia secondary to #1 Right breast cancer, stage Ia, T1b, N0, ER positive, PR positive, HER2 positive status post a right lumpectomy and adjuvant radiation/Taxol/Herceptin, anastrozole starting 01/25/2019 Diabetes Atrial fibrillation-maintained on Coumadin, Coumadin dose adjusted 03/02/2021 Hypothyroidism Ongoing tobacco use Family history of breast cancer-negative genetic testing 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 Artus appears stable.  She will complete another cycle of FOLFIRI today.  She would like to wait on scheduling a restaging CT until after the next cycle of chemotherapy.  She will return for an office visit and FOLFIRI as scheduled on 06/30/2021.  Betsy Coder, MD  06/09/2021  11:05 AM

## 2021-06-10 ENCOUNTER — Ambulatory Visit (INDEPENDENT_AMBULATORY_CARE_PROVIDER_SITE_OTHER): Payer: Medicare Other | Admitting: Cardiovascular Disease

## 2021-06-10 DIAGNOSIS — Z7901 Long term (current) use of anticoagulants: Secondary | ICD-10-CM

## 2021-06-10 DIAGNOSIS — Z5181 Encounter for therapeutic drug level monitoring: Secondary | ICD-10-CM | POA: Diagnosis not present

## 2021-06-10 DIAGNOSIS — I480 Paroxysmal atrial fibrillation: Secondary | ICD-10-CM

## 2021-06-11 ENCOUNTER — Inpatient Hospital Stay: Payer: Medicare Other | Attending: Nurse Practitioner

## 2021-06-11 ENCOUNTER — Other Ambulatory Visit: Payer: Self-pay

## 2021-06-11 VITALS — BP 144/82 | HR 66 | Temp 98.5°F | Resp 18

## 2021-06-11 DIAGNOSIS — Z5189 Encounter for other specified aftercare: Secondary | ICD-10-CM | POA: Insufficient documentation

## 2021-06-11 DIAGNOSIS — C787 Secondary malignant neoplasm of liver and intrahepatic bile duct: Secondary | ICD-10-CM | POA: Insufficient documentation

## 2021-06-11 DIAGNOSIS — Z79811 Long term (current) use of aromatase inhibitors: Secondary | ICD-10-CM | POA: Insufficient documentation

## 2021-06-11 DIAGNOSIS — Z79899 Other long term (current) drug therapy: Secondary | ICD-10-CM | POA: Insufficient documentation

## 2021-06-11 DIAGNOSIS — E039 Hypothyroidism, unspecified: Secondary | ICD-10-CM | POA: Diagnosis not present

## 2021-06-11 DIAGNOSIS — C50911 Malignant neoplasm of unspecified site of right female breast: Secondary | ICD-10-CM | POA: Insufficient documentation

## 2021-06-11 DIAGNOSIS — D508 Other iron deficiency anemias: Secondary | ICD-10-CM | POA: Diagnosis not present

## 2021-06-11 DIAGNOSIS — E119 Type 2 diabetes mellitus without complications: Secondary | ICD-10-CM | POA: Insufficient documentation

## 2021-06-11 DIAGNOSIS — I4891 Unspecified atrial fibrillation: Secondary | ICD-10-CM | POA: Diagnosis not present

## 2021-06-11 DIAGNOSIS — Z5111 Encounter for antineoplastic chemotherapy: Secondary | ICD-10-CM | POA: Diagnosis not present

## 2021-06-11 DIAGNOSIS — Z170 Estrogen receptor positive status [ER+]: Secondary | ICD-10-CM | POA: Diagnosis not present

## 2021-06-11 DIAGNOSIS — C50411 Malignant neoplasm of upper-outer quadrant of right female breast: Secondary | ICD-10-CM

## 2021-06-11 DIAGNOSIS — C180 Malignant neoplasm of cecum: Secondary | ICD-10-CM | POA: Diagnosis present

## 2021-06-11 DIAGNOSIS — Z7901 Long term (current) use of anticoagulants: Secondary | ICD-10-CM | POA: Insufficient documentation

## 2021-06-11 MED ORDER — SODIUM CHLORIDE 0.9% FLUSH
10.0000 mL | INTRAVENOUS | Status: DC | PRN
  Administered 2021-06-11: 10 mL

## 2021-06-11 MED ORDER — HEPARIN SOD (PORK) LOCK FLUSH 100 UNIT/ML IV SOLN
500.0000 [IU] | Freq: Once | INTRAVENOUS | Status: AC | PRN
  Administered 2021-06-11: 500 [IU]

## 2021-06-11 MED ORDER — PEGFILGRASTIM-CBQV 6 MG/0.6ML ~~LOC~~ SOSY
6.0000 mg | PREFILLED_SYRINGE | Freq: Once | SUBCUTANEOUS | Status: AC
  Administered 2021-06-11: 6 mg via SUBCUTANEOUS
  Filled 2021-06-11: qty 0.6

## 2021-06-11 NOTE — Patient Instructions (Signed)

## 2021-06-28 ENCOUNTER — Other Ambulatory Visit: Payer: Self-pay | Admitting: Oncology

## 2021-06-30 ENCOUNTER — Inpatient Hospital Stay: Payer: Medicare Other

## 2021-06-30 ENCOUNTER — Inpatient Hospital Stay (HOSPITAL_BASED_OUTPATIENT_CLINIC_OR_DEPARTMENT_OTHER): Payer: Medicare Other | Admitting: Oncology

## 2021-06-30 ENCOUNTER — Encounter: Payer: Self-pay | Admitting: *Deleted

## 2021-06-30 ENCOUNTER — Encounter: Payer: Self-pay | Admitting: Oncology

## 2021-06-30 ENCOUNTER — Other Ambulatory Visit: Payer: Self-pay

## 2021-06-30 VITALS — BP 155/69 | HR 73

## 2021-06-30 VITALS — BP 132/86 | HR 98 | Temp 97.8°F | Resp 18 | Ht 70.0 in | Wt 125.8 lb

## 2021-06-30 DIAGNOSIS — C50411 Malignant neoplasm of upper-outer quadrant of right female breast: Secondary | ICD-10-CM

## 2021-06-30 DIAGNOSIS — C189 Malignant neoplasm of colon, unspecified: Secondary | ICD-10-CM

## 2021-06-30 DIAGNOSIS — Z170 Estrogen receptor positive status [ER+]: Secondary | ICD-10-CM

## 2021-06-30 DIAGNOSIS — C787 Secondary malignant neoplasm of liver and intrahepatic bile duct: Secondary | ICD-10-CM | POA: Diagnosis not present

## 2021-06-30 DIAGNOSIS — Z5111 Encounter for antineoplastic chemotherapy: Secondary | ICD-10-CM | POA: Diagnosis not present

## 2021-06-30 LAB — CMP (CANCER CENTER ONLY)
ALT: 11 U/L (ref 0–44)
AST: 21 U/L (ref 15–41)
Albumin: 4.6 g/dL (ref 3.5–5.0)
Alkaline Phosphatase: 160 U/L — ABNORMAL HIGH (ref 38–126)
Anion gap: 10 (ref 5–15)
BUN: 16 mg/dL (ref 8–23)
CO2: 32 mmol/L (ref 22–32)
Calcium: 10.2 mg/dL (ref 8.9–10.3)
Chloride: 100 mmol/L (ref 98–111)
Creatinine: 0.56 mg/dL (ref 0.44–1.00)
GFR, Estimated: >60 mL/min (ref >60)
Glucose, Bld: 161 mg/dL — ABNORMAL HIGH (ref 70–99)
Potassium: 3 mmol/L — ABNORMAL LOW (ref 3.5–5.1)
Sodium: 142 mmol/L (ref 135–145)
Total Bilirubin: 0.5 mg/dL (ref 0.3–1.2)
Total Protein: 7.6 g/dL (ref 6.5–8.1)

## 2021-06-30 LAB — CBC WITH DIFFERENTIAL (CANCER CENTER ONLY)
Abs Immature Granulocytes: 0.05 10*3/uL (ref 0.00–0.07)
Basophils Absolute: 0.1 10*3/uL (ref 0.0–0.1)
Basophils Relative: 0 %
Eosinophils Absolute: 0 10*3/uL (ref 0.0–0.5)
Eosinophils Relative: 0 %
HCT: 44.8 % (ref 36.0–46.0)
Hemoglobin: 14.3 g/dL (ref 12.0–15.0)
Immature Granulocytes: 0 %
Lymphocytes Relative: 4 %
Lymphs Abs: 0.5 10*3/uL — ABNORMAL LOW (ref 0.7–4.0)
MCH: 28.5 pg (ref 26.0–34.0)
MCHC: 31.9 g/dL (ref 30.0–36.0)
MCV: 89.4 fL (ref 80.0–100.0)
Monocytes Absolute: 0.5 10*3/uL (ref 0.1–1.0)
Monocytes Relative: 4 %
Neutro Abs: 11.3 10*3/uL — ABNORMAL HIGH (ref 1.7–7.7)
Neutrophils Relative %: 92 %
Platelet Count: 116 10*3/uL — ABNORMAL LOW (ref 150–400)
RBC: 5.01 MIL/uL (ref 3.87–5.11)
RDW: 17.1 % — ABNORMAL HIGH (ref 11.5–15.5)
WBC Count: 12.5 10*3/uL — ABNORMAL HIGH (ref 4.0–10.5)
nRBC: 0 % (ref 0.0–0.2)

## 2021-06-30 LAB — PROTIME-INR
INR: 2.9 — ABNORMAL HIGH (ref 0.8–1.2)
Prothrombin Time: 30.5 seconds — ABNORMAL HIGH (ref 11.4–15.2)

## 2021-06-30 LAB — MAGNESIUM: Magnesium: 2 mg/dL (ref 1.7–2.4)

## 2021-06-30 MED ORDER — FLUOROURACIL CHEMO INJECTION 2.5 GM/50ML
400.0000 mg/m2 | Freq: Once | INTRAVENOUS | Status: AC
  Administered 2021-06-30: 650 mg via INTRAVENOUS
  Filled 2021-06-30: qty 13

## 2021-06-30 MED ORDER — SODIUM CHLORIDE 0.9 % IV SOLN: Freq: Once | INTRAVENOUS | Status: AC

## 2021-06-30 MED ORDER — SODIUM CHLORIDE 0.9 % IV SOLN
2400.0000 mg/m2 | INTRAVENOUS | Status: DC
  Administered 2021-06-30: 4000 mg via INTRAVENOUS
  Filled 2021-06-30: qty 80

## 2021-06-30 MED ORDER — ATROPINE SULFATE 1 MG/ML IV SOLN
0.5000 mg | Freq: Once | INTRAVENOUS | Status: AC | PRN
  Administered 2021-06-30: 0.5 mg via INTRAVENOUS
  Filled 2021-06-30: qty 1

## 2021-06-30 MED ORDER — SODIUM CHLORIDE 0.9 % IV SOLN
150.0000 mg | Freq: Once | INTRAVENOUS | Status: AC
  Administered 2021-06-30: 150 mg via INTRAVENOUS
  Filled 2021-06-30: qty 5

## 2021-06-30 MED ORDER — SODIUM CHLORIDE 0.9 % IV SOLN
180.0000 mg/m2 | Freq: Once | INTRAVENOUS | Status: AC
  Administered 2021-06-30: 300 mg via INTRAVENOUS
  Filled 2021-06-30: qty 15

## 2021-06-30 MED ORDER — SODIUM CHLORIDE 0.9 % IV SOLN
400.0000 mg/m2 | Freq: Once | INTRAVENOUS | Status: AC
  Administered 2021-06-30: 668 mg via INTRAVENOUS
  Filled 2021-06-30: qty 33.4

## 2021-06-30 MED ORDER — LIDOCAINE-PRILOCAINE 2.5-2.5 % EX CREA: 1.0000 "application " | TOPICAL_CREAM | CUTANEOUS | 2 refills | Status: AC | PRN

## 2021-06-30 MED ORDER — HYDROCODONE-ACETAMINOPHEN 5-325 MG PO TABS: 1.0000 | ORAL_TABLET | Freq: Four times a day (QID) | ORAL | 0 refills | Status: DC | PRN

## 2021-06-30 MED ORDER — PALONOSETRON HCL INJECTION 0.25 MG/5ML
0.2500 mg | Freq: Once | INTRAVENOUS | Status: AC
  Administered 2021-06-30: 0.25 mg via INTRAVENOUS
  Filled 2021-06-30: qty 5

## 2021-06-30 MED ORDER — SODIUM CHLORIDE 0.9 % IV SOLN
10.0000 mg | Freq: Once | INTRAVENOUS | Status: AC
  Administered 2021-06-30: 10 mg via INTRAVENOUS
  Filled 2021-06-30: qty 1

## 2021-06-30 NOTE — Progress Notes (Unsigned)
Patient seen by Dr. Benay Spice today ? ?Vitals are within treatment parameters. NO dose changes w/decrease in weight per Dr. Benay Spice. ? ?Labs reviewed by Dr. Benay Spice and are not all within treatment parameters. K+ low at 3.0--will increase KCL to bid at home. Ordered a Mg+ today as well. ? ?Per physician team, patient is ready for treatment and there are NO modifications to the treatment plan.  ?

## 2021-06-30 NOTE — Patient Instructions (Addendum)
[facility]   ?Please take Potassium 2 times per day ?The chemotherapy medication bag should finish at 46 hours, 96 hours, or 7 days. For example, if your pump is scheduled for 46 hours and it was put on at 4:00 p.m., it should finish at 2:00 p.m. the day it is scheduled to come off regardless of your appointment time.   ?  ?Estimated time to finish at 12:15 Thursday, July 02, 2021. ?  ?If the display on your pump reads "Low Volume" and it is beeping, take the batteries out of the pump and come to the cancer center for it to be taken off.  ? ?If the pump alarms go off prior to the pump reading "Low Volume" then call [phone] and someone can assist you. ? ?If the plunger comes out and the chemotherapy medication is leaking out, please use your home chemo spill kit to clean up the spill. Do NOT use paper towels or other household products. ? ?If you have problems or questions regarding your pump, please call either [phone] (24 hours a day) or the cancer center Monday-Friday 8:00 a.m.- 4:30 p.m. at the clinic number and we will assist you. If you are unable to get assistance, then go to the nearest Emergency Department and ask the staff to contact the IV team for assistance.  ?Discharge Instructions: ?Thank you for choosing [facility] to provide your oncology and hematology care.  ? ?If you have a lab appointment with the [facility], please go directly to the [facility] and check in at the registration area. ?  ?Wear comfortable clothing and clothing appropriate for easy access to any Portacath or PICC line.  ? ?We strive to give you quality time with your provider. You may need to reschedule your appointment if you arrive late (15 or more minutes).  Arriving late affects you and other patients whose appointments are after yours.  Also, if you miss three or more appointments without notifying the office, you may be dismissed from the clinic at the  provider?s discretion.    ?  ?For prescription refill requests, have your pharmacy contact our office and allow 72 hours for refills to be completed.   ? ?Today you received the following chemotherapy and/or immunotherapy agents Irinotecan, Leucovorin, Fluorouracil    ?  ?To help prevent nausea and vomiting after your treatment, we encourage you to take your nausea medication as directed. ? ?BELOW ARE SYMPTOMS THAT SHOULD BE REPORTED IMMEDIATELY: ?*FEVER GREATER THAN 100.4 F (38 ?C) OR HIGHER ?*CHILLS OR SWEATING ?*NAUSEA AND VOMITING THAT IS NOT CONTROLLED WITH YOUR NAUSEA MEDICATION ?*UNUSUAL SHORTNESS OF BREATH ?*UNUSUAL BRUISING OR BLEEDING ?*URINARY PROBLEMS (pain or burning when urinating, or frequent urination) ?*BOWEL PROBLEMS (unusual diarrhea, constipation, pain near the anus) ?TENDERNESS IN MOUTH AND THROAT WITH OR WITHOUT PRESENCE OF ULCERS (sore throat, sores in mouth, or a toothache) ?UNUSUAL RASH, SWELLING OR PAIN  ?UNUSUAL VAGINAL DISCHARGE OR ITCHING  ? ?Items with * indicate a potential emergency and should be followed up as soon as possible or go to the Emergency Department if any problems should occur. ? ?Please show the CHEMOTHERAPY ALERT CARD or IMMUNOTHERAPY ALERT CARD at check-in to the Emergency Department and triage nurse. ? ?Should you have questions after your visit or need to cancel or reschedule your appointment, please contact [facility]  Dept: [phone]  and follow the prompts.  Office hours are 8:00 a.m. to 4:30 p.m.  Monday - Friday. Please note that voicemails left after 4:00 p.m. may not be returned until the following business day.  We are closed weekends and major holidays. You have access to a nurse at all times for urgent questions. Please call the main number to the clinic Dept: [phone] and follow the prompts. ? ? ?For any non-urgent questions, you may also contact your provider using MyChart. We now offer e-Visits for anyone 64 and older  to request care online for non-urgent symptoms. For details visit [email or web address]. ?  ?Also download the MyChart app! Go to the app store, search "MyChart", open the app, select [facility], and log in with your MyChart username and password. ? ?Due to Covid, a mask is required upon entering the hospital/clinic. If you do not have a mask, one will be given to you upon arrival. For doctor visits, patients may have 1 support person aged 63 or older with them. For treatment visits, patients cannot have anyone with them due to current Covid guidelines and our immunocompromised population.  ? ?Irinotecan injection ?What is this medication? ?IRINOTECAN (ir in oh TEE kan ) is a chemotherapy drug. It is used to treat colon and rectal cancer. ?This medicine may be used for other purposes; ask your health care provider or pharmacist if you have questions. ?COMMON BRAND NAME(S): Camptosar ?What should I tell my care team before I take this medication? ?They need to know if you have any of these conditions: ?dehydration ?diarrhea ?infection (especially a virus infection such as chickenpox, cold sores, or herpes) ?liver disease ?low blood counts, like low white cell, platelet, or red cell counts ?low levels of calcium, magnesium, or potassium in the blood ?recent or ongoing radiation therapy ?an unusual or allergic reaction to irinotecan, other medicines, foods, dyes, or preservatives ?pregnant or trying to get pregnant ?breast-feeding ?How should I use this medication? ?This drug is given as an infusion into a vein. It is administered in a hospital or clinic by a specially trained health care professional. ?Talk to your pediatrician regarding the use of this medicine in children. Special care may be needed. ?Overdosage: If you think you have taken too much of this medicine contact a poison control center or emergency room at once. ?NOTE: This medicine is only for you. Do not share this medicine with others. ?What if I  miss a dose? ?It is important not to miss your dose. Call your doctor or health care professional if you are unable to keep an appointment. ?What may interact with this medication? ?Do not take this medicine with any of the following medications: ?cobicistat ?itraconazole ?This medicine may interact with the following medications: ?antiviral medicines for HIV or AIDS ?certain antibiotics like rifampin or rifabutin ?certain medicines for fungal infections like ketoconazole, posaconazole, and voriconazole ?certain medicines for seizures like carbamazepine, phenobarbital, phenotoin ?clarithromycin ?gemfibrozil ?nefazodone ?[place] ?This list may not describe all possible interactions. Give your health care provider a list of all the medicines, herbs, non-prescription drugs, or dietary supplements you use. Also tell them if you smoke, drink alcohol, or use illegal drugs. Some items may interact with your medicine. ?What should I watch for while using this medication? ?Your condition will be monitored carefully while you are receiving this medicine. You will need important blood work done while you are taking this medicine. ?This drug may make you feel generally unwell. This is not uncommon, as chemotherapy can affect healthy cells as well as cancer cells. Report any side effects. Continue  your course of treatment even though you feel ill unless your doctor tells you to stop. ?In some cases, you may be given additional medicines to help with side effects. Follow all directions for their use. ?You may get drowsy or dizzy. Do not drive, use machinery, or do anything that needs mental alertness until you know how this medicine affects you. Do not stand or sit up quickly, especially if you are an older patient. This reduces the risk of dizzy or fainting spells. ?Call your health care professional for advice if you get a fever, chills, or sore throat, or other symptoms of a cold or flu. Do not treat yourself. This  medicine decreases your body's ability to fight infections. Try to avoid being around people who are sick. ?Avoid taking products that contain aspirin, acetaminophen, ibuprofen, naproxen, or ketoprofen unless instru

## 2021-06-30 NOTE — Progress Notes (Signed)
Patient presents for treatment. RN assessment completed along with the following: ? ?Labs/vitals reviewed - Yes, and Ok to treat with K of 3. Per Dr Benay Spice    ?Weight within 10% of previous measurement - No, provider and pharmacy notified of weight change. Weight loss of 11 lbs. Dr Benay Spice aware ?Informed consent completed and reflects current therapy/intent - Yes, on date 117/22             ?Provider progress note reviewed - Yes, today's provider note was reviewed. ?Treatment/Antibody/Supportive plan reviewed - Yes, and there are no adjustments needed for today's treatment. ?S&H and other orders reviewed - Yes, and there are no additional orders identified. ?Previous treatment date reviewed - Yes, and the appropriate amount of time has elapsed between treatments. ?Clinic Hand Off Received from - Cristy Friedlander, RN ? ?Patient to proceed with treatment.  ? ?

## 2021-06-30 NOTE — Progress Notes (Signed)
?[facility] ?OFFICE PROGRESS NOTE ? ? ?Diagnosis: Colon cancer ? ?INTERVAL HISTORY:  ? ?Ms. Lindfors completed another cycle of f FOLFIRI on 06/09/2021.  No nausea/vomiting or mouth sores.  She had diarrhea during the week following chemotherapy.  This was relieved with Lomotil.  She then developed constipation.  She has increased abdominal pain and anorexia.  She takes hydrocodone as needed for pain. ?The pain is not as severe compared to pre-FOLFIRI.  She continues to have neuropathy symptoms in the extremities ? ?Objective: ? ?Vital signs in last 24 hours: ? ?Blood pressure 132/86, pulse 98, temperature 97.8 ?F (36.6 ?C), temperature source Oral, resp. rate 18, height $RemoveBe'5\' 10"'[id number]$  (1.778 m), weight 125 lb 12.8 oz (57.1 kg), SpO2 97 %. ?  ? ?HEENT: No thrush or ulcers ?Resp: Lungs with distant breath sounds and scattered inspiratory/expiratory rhonchi, no respiratory distress ?Cardio: Regular rate and rhythm ?GI: No hepatosplenomegaly, fullness in the upper abdomen without a discrete mass ?Vascular: No leg edema, the left lower leg is slightly larger than the right side  ? ?Portacath/PICC-without erythema ? ?Lab Results: ? ?Lab Results  ?Component Value Date  ? WBC 10.0 06/09/2021  ? HGB 11.7 (L) 06/09/2021  ? HCT 37.7 06/09/2021  ? MCV 93.8 06/09/2021  ? PLT 101 (L) 06/09/2021  ? NEUTROABS 8.5 (H) 06/09/2021  ? ? ?CMP  ?Lab Results  ?Component Value Date  ? NA 141 06/09/2021  ? K 3.5 06/09/2021  ? CL 102 06/09/2021  ? CO2 32 06/09/2021  ? GLUCOSE 177 (H) 06/09/2021  ? BUN 9 06/09/2021  ? CREATININE 0.44 06/09/2021  ? CALCIUM 9.3 06/09/2021  ? PROT 6.2 (L) 06/09/2021  ? ALBUMIN 4.1 06/09/2021  ? AST 18 06/09/2021  ? ALT 11 06/09/2021  ? ALKPHOS 181 (H) 06/09/2021  ? BILITOT 0.3 06/09/2021  ? GFRNONAA >60 06/09/2021  ? GFRAA >60 08/17/2019  ? ? ?Lab Results  ?Component Value Date  ? CEA1 1.40 12/16/2020  ? CEA 1.57 02/16/2021  ? ? ?Lab Results  ?Component Value Date  ? INR 2.9 (H) 06/30/2021  ? LABPROT 30.5  (H) 06/30/2021  ? ? ? ?Medications: I have reviewed the patient's current medications. ? ? 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, stage IIIc pT3pN2b ?Moderately differentiated adenocarcinoma, 9/30 lymph nodes positive, lymphovascular invasion present including large vessel extramural invasion, loss of MLH1 and PMS2, MLH1 hyper methylation present ?Incomplete colonoscopy secondary to stricturing at the sigmoid colon ?Virtual colonoscopy 03/13/2020- mass centered at the medial wall of the cecum and terminal ileum, adjacent ileocolic mesenteric adenopathy, nonspecific 3 mm right lower lobe nodule ?PET 63/81/7711 hypermetabolic cecal/terminal ileum mass with adjacent enlarged and hypermetabolic ileocolic lymph nodes, previously noted segment 7 liver lesion-not hypermetabolic- benign etiology favored, dominant left lower lobe pulmonary nodule not hypermetabolic, other tiny nodules not hypermetabolic and below PET sensitivity ?Cycle 1 FOLFOX 05/12/2020 ?Cycle 2 FOLFOX 05/26/2020 ?Cycle 3 FOLFOX 06/09/2020 ?Cycle 4 FOLFOX 06/23/2020 ?Cycle 5 FOLFOX 07/07/2020 ?Cycle 12 FOLFOX 10/14/2020, oxaliplatin dose reduced beginning with cycle 9 ?CT 12/04/2020-development of peripheral enhancing hypoattenuating mass in segment 5/8, necrotic porta hepatis lymphadenopathy, similar appearance of previously visualized nonhypermetabolic subcapsular segment 7 lesion ?Ultrasound-guided biopsy right liver lesion 12/22/2020-adenocarcinoma consistent with a colon primary ?PET scan 12/30/2020-hypermetabolic hepatic lesion and portal adenopathy-increased in size compared to 12/04/2020 CT, small hypermetabolic left periaortic node ?Foundation 1-MSI high, tumor mutation burden 31, K-ras wild-type ?Cycle 1 Pembrolizumab 01/15/2021 ?Cycle 1 FOLFIRI 02/16/2021 ?Cycle 2 FOLFIRI 03/02/2021-Emend and Udenyca added ?  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  ?  ? ? ?Disposition: ?Ms. Poynor has metastatic colon cancer.  Her clinical status has declined over the past several weeks with increased pain and anorexia.  She will continue hydrocodone as needed for pain.  Encouraged her to increase the use of nutrition supplements.  She will undergo a restaging CT evaluation next week.  We will consider reintroducing pembrolizumab if there is progressive disease on the CT. ? ?The PT/INR is therapeutic.  She will continue Coumadin at current dose. ? ?I refilled her prescription for hydrocodone. ? ?Betsy Coder, MD ? ?06/30/2021  ?10:24 AM ? ? ?

## 2021-07-01 ENCOUNTER — Ambulatory Visit (INDEPENDENT_AMBULATORY_CARE_PROVIDER_SITE_OTHER): Payer: Medicare Other | Admitting: Cardiovascular Disease

## 2021-07-01 DIAGNOSIS — Z5181 Encounter for therapeutic drug level monitoring: Secondary | ICD-10-CM

## 2021-07-01 DIAGNOSIS — I480 Paroxysmal atrial fibrillation: Secondary | ICD-10-CM

## 2021-07-01 DIAGNOSIS — Z7901 Long term (current) use of anticoagulants: Secondary | ICD-10-CM

## 2021-07-02 ENCOUNTER — Other Ambulatory Visit: Payer: Self-pay

## 2021-07-02 ENCOUNTER — Inpatient Hospital Stay: Payer: Medicare Other

## 2021-07-02 VITALS — BP 113/69 | HR 79 | Temp 98.2°F | Resp 18

## 2021-07-02 DIAGNOSIS — C787 Secondary malignant neoplasm of liver and intrahepatic bile duct: Secondary | ICD-10-CM

## 2021-07-02 DIAGNOSIS — C189 Malignant neoplasm of colon, unspecified: Secondary | ICD-10-CM

## 2021-07-02 DIAGNOSIS — Z5111 Encounter for antineoplastic chemotherapy: Secondary | ICD-10-CM | POA: Diagnosis not present

## 2021-07-02 DIAGNOSIS — C50411 Malignant neoplasm of upper-outer quadrant of right female breast: Secondary | ICD-10-CM

## 2021-07-02 DIAGNOSIS — Z170 Estrogen receptor positive status [ER+]: Secondary | ICD-10-CM

## 2021-07-02 MED ORDER — PEGFILGRASTIM-CBQV 6 MG/0.6ML ~~LOC~~ SOSY
6.0000 mg | PREFILLED_SYRINGE | Freq: Once | SUBCUTANEOUS | Status: AC
  Administered 2021-07-02: 6 mg via SUBCUTANEOUS
  Filled 2021-07-02: qty 0.6

## 2021-07-02 MED ORDER — SODIUM CHLORIDE 0.9% FLUSH
10.0000 mL | INTRAVENOUS | Status: DC | PRN
  Administered 2021-07-02: 10 mL

## 2021-07-02 MED ORDER — HEPARIN SOD (PORK) LOCK FLUSH 100 UNIT/ML IV SOLN
500.0000 [IU] | Freq: Once | INTRAVENOUS | Status: AC | PRN
  Administered 2021-07-02: 500 [IU]

## 2021-07-02 NOTE — Patient Instructions (Signed)

## 2021-07-10 ENCOUNTER — Ambulatory Visit (HOSPITAL_BASED_OUTPATIENT_CLINIC_OR_DEPARTMENT_OTHER)
Admission: RE | Admit: 2021-07-10 | Discharge: 2021-07-10 | Disposition: A | Discharge status: Home / Self Care | Payer: Medicare Other | Source: Ambulatory Visit | Attending: Oncology | Admitting: Oncology

## 2021-07-10 DIAGNOSIS — C787 Secondary malignant neoplasm of liver and intrahepatic bile duct: Secondary | ICD-10-CM | POA: Insufficient documentation

## 2021-07-10 DIAGNOSIS — C189 Malignant neoplasm of colon, unspecified: Secondary | ICD-10-CM | POA: Insufficient documentation

## 2021-07-10 MED ORDER — IOHEXOL 300 MG/ML  SOLN
100.0000 mL | Freq: Once | INTRAMUSCULAR | Status: AC | PRN
  Administered 2021-07-10: 80 mL via INTRAVENOUS

## 2021-07-12 ENCOUNTER — Other Ambulatory Visit: Payer: Self-pay | Admitting: Oncology

## 2021-07-14 ENCOUNTER — Inpatient Hospital Stay: Payer: Medicare Other

## 2021-07-14 ENCOUNTER — Encounter: Payer: Self-pay | Admitting: Nurse Practitioner

## 2021-07-14 ENCOUNTER — Inpatient Hospital Stay: Payer: Medicare Other | Attending: Nurse Practitioner

## 2021-07-14 ENCOUNTER — Ambulatory Visit (INDEPENDENT_AMBULATORY_CARE_PROVIDER_SITE_OTHER): Payer: Medicare Other

## 2021-07-14 ENCOUNTER — Inpatient Hospital Stay (HOSPITAL_BASED_OUTPATIENT_CLINIC_OR_DEPARTMENT_OTHER): Payer: Medicare Other | Admitting: Nurse Practitioner

## 2021-07-14 ENCOUNTER — Encounter: Payer: Self-pay | Admitting: *Deleted

## 2021-07-14 VITALS — BP 152/87 | HR 100 | Temp 97.8°F | Resp 18 | Ht 70.0 in | Wt 127.0 lb

## 2021-07-14 DIAGNOSIS — Z79811 Long term (current) use of aromatase inhibitors: Secondary | ICD-10-CM | POA: Diagnosis not present

## 2021-07-14 DIAGNOSIS — Z923 Personal history of irradiation: Secondary | ICD-10-CM | POA: Insufficient documentation

## 2021-07-14 DIAGNOSIS — Z79899 Other long term (current) drug therapy: Secondary | ICD-10-CM | POA: Insufficient documentation

## 2021-07-14 DIAGNOSIS — C787 Secondary malignant neoplasm of liver and intrahepatic bile duct: Secondary | ICD-10-CM

## 2021-07-14 DIAGNOSIS — Z5181 Encounter for therapeutic drug level monitoring: Secondary | ICD-10-CM | POA: Diagnosis not present

## 2021-07-14 DIAGNOSIS — Z170 Estrogen receptor positive status [ER+]: Secondary | ICD-10-CM | POA: Diagnosis not present

## 2021-07-14 DIAGNOSIS — I4891 Unspecified atrial fibrillation: Secondary | ICD-10-CM | POA: Diagnosis not present

## 2021-07-14 DIAGNOSIS — C50911 Malignant neoplasm of unspecified site of right female breast: Secondary | ICD-10-CM | POA: Insufficient documentation

## 2021-07-14 DIAGNOSIS — Z9221 Personal history of antineoplastic chemotherapy: Secondary | ICD-10-CM | POA: Diagnosis not present

## 2021-07-14 DIAGNOSIS — C189 Malignant neoplasm of colon, unspecified: Secondary | ICD-10-CM

## 2021-07-14 DIAGNOSIS — C180 Malignant neoplasm of cecum: Secondary | ICD-10-CM | POA: Insufficient documentation

## 2021-07-14 DIAGNOSIS — I480 Paroxysmal atrial fibrillation: Secondary | ICD-10-CM

## 2021-07-14 DIAGNOSIS — E119 Type 2 diabetes mellitus without complications: Secondary | ICD-10-CM | POA: Diagnosis not present

## 2021-07-14 DIAGNOSIS — F17210 Nicotine dependence, cigarettes, uncomplicated: Secondary | ICD-10-CM | POA: Diagnosis not present

## 2021-07-14 DIAGNOSIS — Z5111 Encounter for antineoplastic chemotherapy: Secondary | ICD-10-CM | POA: Diagnosis not present

## 2021-07-14 DIAGNOSIS — E039 Hypothyroidism, unspecified: Secondary | ICD-10-CM | POA: Insufficient documentation

## 2021-07-14 DIAGNOSIS — Z5189 Encounter for other specified aftercare: Secondary | ICD-10-CM | POA: Insufficient documentation

## 2021-07-14 DIAGNOSIS — C50411 Malignant neoplasm of upper-outer quadrant of right female breast: Secondary | ICD-10-CM

## 2021-07-14 DIAGNOSIS — D509 Iron deficiency anemia, unspecified: Secondary | ICD-10-CM | POA: Insufficient documentation

## 2021-07-14 DIAGNOSIS — Z7901 Long term (current) use of anticoagulants: Secondary | ICD-10-CM

## 2021-07-14 DIAGNOSIS — R1084 Generalized abdominal pain: Secondary | ICD-10-CM | POA: Diagnosis not present

## 2021-07-14 LAB — CMP (CANCER CENTER ONLY)
ALT: 15 U/L (ref 10–47)
AST: 23 U/L (ref 11–38)
Albumin: 4.5 g/dL (ref 3.5–5.0)
Alkaline Phosphatase: 206 U/L — ABNORMAL HIGH (ref 38–126)
Anion gap: 15 (ref 5–15)
BUN: 16 mg/dL (ref 8–23)
CO2: 28 mmol/L (ref 22–32)
Calcium: 10 mg/dL (ref 8.9–10.3)
Chloride: 99 mmol/L (ref 98–111)
Creatinine: 0.46 mg/dL — ABNORMAL LOW (ref 0.60–1.20)
Glucose, Bld: 204 mg/dL — ABNORMAL HIGH (ref 70–99)
Potassium: 3.5 mmol/L (ref 3.5–5.1)
Sodium: 142 mmol/L (ref 135–145)
Total Bilirubin: 0.4 mg/dL (ref 0.2–1.6)
Total Protein: 7.6 g/dL (ref 6.5–8.1)

## 2021-07-14 LAB — CBC WITH DIFFERENTIAL (CANCER CENTER ONLY)
Abs Immature Granulocytes: 0.09 10*3/uL — ABNORMAL HIGH (ref 0.00–0.07)
Basophils Absolute: 0.1 10*3/uL (ref 0.0–0.1)
Basophils Relative: 1 %
Eosinophils Absolute: 0.1 10*3/uL (ref 0.0–0.5)
Eosinophils Relative: 0 %
HCT: 44.2 % (ref 36.0–46.0)
Hemoglobin: 13.9 g/dL (ref 12.0–15.0)
Immature Granulocytes: 1 %
Lymphocytes Relative: 4 %
Lymphs Abs: 0.6 10*3/uL — ABNORMAL LOW (ref 0.7–4.0)
MCH: 28.3 pg (ref 26.0–34.0)
MCHC: 31.4 g/dL (ref 30.0–36.0)
MCV: 89.8 fL (ref 80.0–100.0)
Monocytes Absolute: 0.5 10*3/uL (ref 0.1–1.0)
Monocytes Relative: 3 %
Neutro Abs: 12.6 10*3/uL — ABNORMAL HIGH (ref 1.7–7.7)
Neutrophils Relative %: 91 %
Platelet Count: 163 10*3/uL (ref 150–400)
RBC: 4.92 MIL/uL (ref 3.87–5.11)
RDW: 18 % — ABNORMAL HIGH (ref 11.5–15.5)
WBC Count: 13.9 10*3/uL — ABNORMAL HIGH (ref 4.0–10.5)
nRBC: 0 % (ref 0.0–0.2)

## 2021-07-14 LAB — PROTIME-INR
INR: 2 — ABNORMAL HIGH (ref 0.8–1.2)
Prothrombin Time: 22.7 seconds — ABNORMAL HIGH (ref 11.4–15.2)

## 2021-07-14 MED ORDER — PALONOSETRON HCL INJECTION 0.25 MG/5ML
0.2500 mg | Freq: Once | INTRAVENOUS | Status: AC
  Administered 2021-07-14: 0.25 mg via INTRAVENOUS
  Filled 2021-07-14: qty 5

## 2021-07-14 MED ORDER — SODIUM CHLORIDE 0.9 % IV SOLN
180.0000 mg/m2 | Freq: Once | INTRAVENOUS | Status: AC
  Administered 2021-07-14: 300 mg via INTRAVENOUS
  Filled 2021-07-14: qty 15

## 2021-07-14 MED ORDER — ATROPINE SULFATE 1 MG/ML IV SOLN
0.5000 mg | Freq: Once | INTRAVENOUS | Status: AC | PRN
  Administered 2021-07-14: 0.5 mg via INTRAVENOUS
  Filled 2021-07-14: qty 1

## 2021-07-14 MED ORDER — SODIUM CHLORIDE 0.9 % IV SOLN
10.0000 mg | Freq: Once | INTRAVENOUS | Status: AC
  Administered 2021-07-14: 10 mg via INTRAVENOUS
  Filled 2021-07-14: qty 1

## 2021-07-14 MED ORDER — SODIUM CHLORIDE 0.9 % IV SOLN
2400.0000 mg/m2 | INTRAVENOUS | Status: DC
  Administered 2021-07-14: 4000 mg via INTRAVENOUS
  Filled 2021-07-14: qty 80

## 2021-07-14 MED ORDER — SODIUM CHLORIDE 0.9 % IV SOLN: Freq: Once | INTRAVENOUS | Status: AC

## 2021-07-14 MED ORDER — SODIUM CHLORIDE 0.9 % IV SOLN
150.0000 mg | Freq: Once | INTRAVENOUS | Status: AC
  Administered 2021-07-14: 150 mg via INTRAVENOUS
  Filled 2021-07-14: qty 5

## 2021-07-14 MED ORDER — SODIUM CHLORIDE 0.9 % IV SOLN
400.0000 mg/m2 | Freq: Once | INTRAVENOUS | Status: AC
  Administered 2021-07-14: 668 mg via INTRAVENOUS
  Filled 2021-07-14: qty 33.4

## 2021-07-14 MED ORDER — PANTOPRAZOLE SODIUM 40 MG PO TBEC: 40.0000 mg | DELAYED_RELEASE_TABLET | Freq: Two times a day (BID) | ORAL | 1 refills | Status: DC

## 2021-07-14 MED ORDER — FLUOROURACIL CHEMO INJECTION 2.5 GM/50ML
400.0000 mg/m2 | Freq: Once | INTRAVENOUS | Status: AC
  Administered 2021-07-14: 650 mg via INTRAVENOUS
  Filled 2021-07-14: qty 13

## 2021-07-14 NOTE — Patient Instructions (Addendum)
[facility]   ?The chemotherapy medication bag should finish at 46 hours, 96 hours, or 7 days. For example, if your pump is scheduled for 46 hours and it was put on at 4:00 p.m., it should finish at 2:00 p.m. the day it is scheduled to come off regardless of your appointment time.   ?  ?Estimated time to finish at 1:30 Thursday, July 16, 2021. ?  ?If the display on your pump reads "Low Volume" and it is beeping, take the batteries out of the pump and come to the cancer center for it to be taken off.  ? ?If the pump alarms go off prior to the pump reading "Low Volume" then call [phone] and someone can assist you. ? ?If the plunger comes out and the chemotherapy medication is leaking out, please use your home chemo spill kit to clean up the spill. Do NOT use paper towels or other household products. ? ?If you have problems or questions regarding your pump, please call either [phone] (24 hours a day) or the cancer center Monday-Friday 8:00 a.m.- 4:30 p.m. at the clinic number and we will assist you. If you are unable to get assistance, then go to the nearest Emergency Department and ask the staff to contact the IV team for assistance.  ?Discharge Instructions: ?Thank you for choosing [facility] to provide your oncology and hematology care.  ? ?If you have a lab appointment with the [facility], please go directly to the [facility] and check in at the registration area. ?  ?Wear comfortable clothing and clothing appropriate for easy access to any Portacath or PICC line.  ? ?We strive to give you quality time with your provider. You may need to reschedule your appointment if you arrive late (15 or more minutes).  Arriving late affects you and other patients whose appointments are after yours.  Also, if you miss three or more appointments without notifying the office, you may be dismissed from the clinic at the provider?s discretion.    ?  ?For prescription  refill requests, have your pharmacy contact our office and allow 72 hours for refills to be completed.   ? ?Today you received the following chemotherapy and/or immunotherapy agents Irinotecan, Leucovorin, Fluoruracil    ?  ?To help prevent nausea and vomiting after your treatment, we encourage you to take your nausea medication as directed. ? ?BELOW ARE SYMPTOMS THAT SHOULD BE REPORTED IMMEDIATELY: ?*FEVER GREATER THAN 100.4 F (38 ?C) OR HIGHER ?*CHILLS OR SWEATING ?*NAUSEA AND VOMITING THAT IS NOT CONTROLLED WITH YOUR NAUSEA MEDICATION ?*UNUSUAL SHORTNESS OF BREATH ?*UNUSUAL BRUISING OR BLEEDING ?*URINARY PROBLEMS (pain or burning when urinating, or frequent urination) ?*BOWEL PROBLEMS (unusual diarrhea, constipation, pain near the anus) ?TENDERNESS IN MOUTH AND THROAT WITH OR WITHOUT PRESENCE OF ULCERS (sore throat, sores in mouth, or a toothache) ?UNUSUAL RASH, SWELLING OR PAIN  ?UNUSUAL VAGINAL DISCHARGE OR ITCHING  ? ?Items with * indicate a potential emergency and should be followed up as soon as possible or go to the Emergency Department if any problems should occur. ? ?Please show the CHEMOTHERAPY ALERT CARD or IMMUNOTHERAPY ALERT CARD at check-in to the Emergency Department and triage nurse. ? ?Should you have questions after your visit or need to cancel or reschedule your appointment, please contact [facility]  Dept: [phone]  and follow the prompts.  Office hours are 8:00 a.m. to 4:30 p.m. Monday - Friday. Please note that voicemails  left after 4:00 p.m. may not be returned until the following business day.  We are closed weekends and major holidays. You have access to a nurse at all times for urgent questions. Please call the main number to the clinic Dept: [phone] and follow the prompts. ? ? ?For any non-urgent questions, you may also contact your provider using MyChart. We now offer e-Visits for anyone 68 and older to request care online for non-urgent symptoms.  For details visit [email or web address]. ?  ?Also download the MyChart app! Go to the app store, search "MyChart", open the app, select [facility], and log in with your MyChart username and password. ? ?Due to Covid, a mask is required upon entering the hospital/clinic. If you do not have a mask, one will be given to you upon arrival. For doctor visits, patients may have 1 support person aged 30 or older with them. For treatment visits, patients cannot have anyone with them due to current Covid guidelines and our immunocompromised population.  ? ?Irinotecan injection ?What is this medication? ?IRINOTECAN (ir in oh TEE kan ) is a chemotherapy drug. It is used to treat colon and rectal cancer. ?This medicine may be used for other purposes; ask your health care provider or pharmacist if you have questions. ?COMMON BRAND NAME(S): Camptosar ?What should I tell my care team before I take this medication? ?They need to know if you have any of these conditions: ?dehydration ?diarrhea ?infection (especially a virus infection such as chickenpox, cold sores, or herpes) ?liver disease ?low blood counts, like low white cell, platelet, or red cell counts ?low levels of calcium, magnesium, or potassium in the blood ?recent or ongoing radiation therapy ?an unusual or allergic reaction to irinotecan, other medicines, foods, dyes, or preservatives ?pregnant or trying to get pregnant ?breast-feeding ?How should I use this medication? ?This drug is given as an infusion into a vein. It is administered in a hospital or clinic by a specially trained health care professional. ?Talk to your pediatrician regarding the use of this medicine in children. Special care may be needed. ?Overdosage: If you think you have taken too much of this medicine contact a poison control center or emergency room at once. ?NOTE: This medicine is only for you. Do not share this medicine with others. ?What if I miss a dose? ?It is important not to miss your  dose. Call your doctor or health care professional if you are unable to keep an appointment. ?What may interact with this medication? ?Do not take this medicine with any of the following medications: ?cobicistat ?itraconazole ?This medicine may interact with the following medications: ?antiviral medicines for HIV or AIDS ?certain antibiotics like rifampin or rifabutin ?certain medicines for fungal infections like ketoconazole, posaconazole, and voriconazole ?certain medicines for seizures like carbamazepine, phenobarbital, phenotoin ?clarithromycin ?gemfibrozil ?nefazodone ?[place] ?This list may not describe all possible interactions. Give your health care provider a list of all the medicines, herbs, non-prescription drugs, or dietary supplements you use. Also tell them if you smoke, drink alcohol, or use illegal drugs. Some items may interact with your medicine. ?What should I watch for while using this medication? ?Your condition will be monitored carefully while you are receiving this medicine. You will need important blood work done while you are taking this medicine. ?This drug may make you feel generally unwell. This is not uncommon, as chemotherapy can affect healthy cells as well as cancer cells. Report any side effects. Continue your course of treatment even though you  feel ill unless your doctor tells you to stop. ?In some cases, you may be given additional medicines to help with side effects. Follow all directions for their use. ?You may get drowsy or dizzy. Do not drive, use machinery, or do anything that needs mental alertness until you know how this medicine affects you. Do not stand or sit up quickly, especially if you are an older patient. This reduces the risk of dizzy or fainting spells. ?Call your health care professional for advice if you get a fever, chills, or sore throat, or other symptoms of a cold or flu. Do not treat yourself. This medicine decreases your body's ability to fight  infections. Try to avoid being around people who are sick. ?Avoid taking products that contain aspirin, acetaminophen, ibuprofen, naproxen, or ketoprofen unless instructed by your doctor. These medicines may h

## 2021-07-14 NOTE — Progress Notes (Signed)
Patient seen by Lisa Thomas NP today  Vitals are within treatment parameters.  Labs reviewed by Lisa Thomas NP and are within treatment parameters.  Per physician team, patient is ready for treatment and there are NO modifications to the treatment plan.     

## 2021-07-14 NOTE — Progress Notes (Signed)
?[facility] ?OFFICE PROGRESS NOTE ? ? ?Diagnosis: Colon cancer ? ?INTERVAL HISTORY:  ? ?Sabrina [name] returns as scheduled.  She completed cycle 10 FOLFIRI 06/30/2021.  She continues to have upper abdominal pain.  Periodic nausea.  Intermittent diarrhea.  She notes frequent burping. ? ?Objective: ? ?Vital signs in last 24 hours: ? ?Blood pressure (!) 152/87, pulse 100, temperature 97.8 ?F (36.6 ?C), temperature source Oral, resp. rate 18, height _0  (1.778 m), weight 127 lb (57.6 kg), SpO2 100 %. ?  ? ?HEENT: No thrush or ulcers. ?Resp: Faint scattered wheezes.  No respiratory distress. ?Cardio: Regular rate and rhythm. ?GI: No hepatosplenomegaly.  Fullness right upper abdomen without a discrete mass. ?Vascular: No leg edema. ?Port-A-Cath without erythema. ? ?Lab Results: ? ?Lab Results  ?Component Value Date  ? WBC 13.9 (H) 07/14/2021  ? HGB 13.9 07/14/2021  ? HCT 44.2 07/14/2021  ? MCV 89.8 07/14/2021  ? PLT 163 07/14/2021  ? NEUTROABS 12.6 (H) 07/14/2021  ? ? ?Imaging: ? ?No results found. ? ?Medications: I have reviewed the patient's current medications. ? 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, stage IIIc pT3pN2b ?Moderately differentiated adenocarcinoma, 9/30 lymph nodes positive, lymphovascular invasion present including large vessel extramural invasion, loss of MLH1 and PMS2, MLH1 hyper methylation present ?Incomplete colonoscopy secondary to stricturing at the sigmoid colon ?Virtual colonoscopy 03/13/2020- mass centered at the medial wall of the cecum and terminal ileum, adjacent ileocolic mesenteric adenopathy, nonspecific 3 mm right lower lobe nodule ?PET 93/81/8299 hypermetabolic cecal/terminal ileum mass with adjacent enlarged and hypermetabolic ileocolic lymph nodes, previously noted segment 7 liver lesion-not hypermetabolic- benign etiology favored, dominant left lower lobe pulmonary nodule not hypermetabolic, other tiny nodules not hypermetabolic  and below PET sensitivity ?Cycle 1 FOLFOX 05/12/2020 ?Cycle 2 FOLFOX 05/26/2020 ?Cycle 3 FOLFOX 06/09/2020 ?Cycle 4 FOLFOX 06/23/2020 ?Cycle 5 FOLFOX 07/07/2020 ?Cycle 12 FOLFOX 10/14/2020, oxaliplatin dose reduced beginning with cycle 9 ?CT 12/04/2020-development of peripheral enhancing hypoattenuating mass in segment 5/8, necrotic porta hepatis lymphadenopathy, similar appearance of previously visualized nonhypermetabolic subcapsular segment 7 lesion ?Ultrasound-guided biopsy right liver lesion 12/22/2020-adenocarcinoma consistent with a colon primary ?PET scan 12/30/2020-hypermetabolic hepatic lesion and portal adenopathy-increased in size compared to 12/04/2020 CT, small hypermetabolic left periaortic node ?Foundation 1-MSI high, tumor mutation burden 31, K-ras wild-type ?Cycle 1 Pembrolizumab 01/15/2021 ?Cycle 1 FOLFIRI 02/16/2021 ?Cycle 2 FOLFIRI 03/02/2021-Emend and Udenyca added ?Cycle 3 FOLFIRI 03/17/2021 ?Cycle 4 FOLFIRI 03/31/2021 ?Cycle 5 FOLFIRI 04/15/2021 ?CT abdomen/pelvis 04/23/2021-mild decrease in size of right hepatic lobe metastases, mild decrease in porta hepatis and portacaval lymphadenopathy, mild increase in left periaortic lymphadenopathy ?Cycle 6 FOLFIRI 04/28/2021 ?Cycle 7 FOLFIRI 05/12/2021 ?Cycle 8 FOLFIRI 05/26/2021 ?Cycle 9 FOLFIRI 06/09/2021 ?Cycle 10 FOLFIRI 06/30/2021 ?CTs 07/10/2021-decrease in size of right hepatic lobe metastatic lesion and periportal and retroperitoneal nodal disease.  No new or progressive findings. ?Cycle 11 FOLFIRI 07/14/2021 ?Iron deficiency anemia secondary to #1 ?Right breast cancer, stage Ia, T1b, N0, ER positive, PR positive, HER2 positive status post a right lumpectomy and adjuvant radiation/Taxol/Herceptin, anastrozole starting 01/25/2019 ?Diabetes ?Atrial fibrillation-maintained on Coumadin, Coumadin dose adjusted 03/02/2021 ?Hypothyroidism ?Ongoing tobacco use ?Family history of breast cancer-negative genetic testing ?Oxaliplatin neuropathy ?Intermittent upper abdominal  discomfort-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  ?  ? ?Disposition: Sabrina [name] has completed 10 cycles of FOLFIRI.  Recent restaging CT scans show improvement in the liver lesion and abdominal adenopathy, no new or progressive findings.  Plan to continue FOLFIRI with cycle 11 today, thereafter will adjust to  a 3-week schedule.  She agrees with this plan. ? ?Etiology of the upper abdominal pain is unclear.  She will begin Protonix 40 mg twice daily. ? ?She will return for lab, follow-up, FOLFIRI in 3 weeks.  We are available to see her sooner if needed. ? ?Patient seen with Dr. Benay Spice.  CT images reviewed on the computer with Sabrina [name] and her daughter. ? ? ? ?Ned Card ANP/GNP-BC  ? ?07/14/2021  ?10:53 AM ?This was a shared visit with Ned Card.  Sabrina [name] was interviewed and examined.  We reviewed the restaging CT findings and images with Sabrina [name].  She has upper abdominal pain of unclear etiology.  We think it is unlikely the pain is related to the improving liver and nodal metastases.  She will begin a trial of Protonix.  The plan is to continue FOLFIRI chemotherapy. ? ?We will consider a repeat trial of pembrolizumab if it becomes clear the pain is related to the metastatic colon cancer. ? ?I was present for greater than 50% of today's visit.  I performed medical decision making. ? ? ?Julieanne Manson, MD ? ? ? ?

## 2021-07-14 NOTE — Progress Notes (Signed)
Patient presents for treatment. RN assessment completed along with the following: ? ?Labs/vitals reviewed - Yes, and within treatment parameters.   ?Weight within 10% of previous measurement - Yes ?Informed consent completed and reflects current therapy/intent - Yes, on date 02/16/2021             ?Provider progress note reviewed - Yes, today's provider note was reviewed. ?Treatment/Antibody/Supportive plan reviewed - Yes, and there are no adjustments needed for today's treatment. ?S&H and other orders reviewed - Yes, and there are no additional orders identified. ?Previous treatment date reviewed - Yes, and the appropriate amount of time has elapsed between treatments. ?Clinic Hand Off Received from - Yes from [place], RN ? ?Patient to proceed with treatment.  ? ?

## 2021-07-15 NOTE — Patient Instructions (Signed)
Description   ?Continue '10mg'$  daily except '5mg'$  on Mondays, Wednesdays and Saturdays. Recheck INR 3 weeks at [facility]. Coumadin Clinic# [phone]. ? ?**PT/INR drawn at [facility]**   ?  ?   ?

## 2021-07-16 ENCOUNTER — Inpatient Hospital Stay: Payer: Medicare Other

## 2021-07-16 VITALS — BP 108/74 | HR 90 | Temp 98.2°F | Resp 20

## 2021-07-16 DIAGNOSIS — Z170 Estrogen receptor positive status [ER+]: Secondary | ICD-10-CM

## 2021-07-16 DIAGNOSIS — Z5111 Encounter for antineoplastic chemotherapy: Secondary | ICD-10-CM | POA: Diagnosis not present

## 2021-07-16 DIAGNOSIS — C189 Malignant neoplasm of colon, unspecified: Secondary | ICD-10-CM

## 2021-07-16 MED ORDER — PEGFILGRASTIM-CBQV 6 MG/0.6ML ~~LOC~~ SOSY
6.0000 mg | PREFILLED_SYRINGE | Freq: Once | SUBCUTANEOUS | Status: AC
  Administered 2021-07-16: 6 mg via SUBCUTANEOUS
  Filled 2021-07-16: qty 0.6

## 2021-07-16 MED ORDER — HEPARIN SOD (PORK) LOCK FLUSH 100 UNIT/ML IV SOLN
500.0000 [IU] | Freq: Once | INTRAVENOUS | Status: AC | PRN
  Administered 2021-07-16: 500 [IU]

## 2021-07-16 MED ORDER — SODIUM CHLORIDE 0.9% FLUSH
10.0000 mL | INTRAVENOUS | Status: DC | PRN
  Administered 2021-07-16: 10 mL

## 2021-07-16 NOTE — Patient Instructions (Signed)

## 2021-08-01 ENCOUNTER — Other Ambulatory Visit: Payer: Self-pay | Admitting: Oncology

## 2021-08-04 ENCOUNTER — Inpatient Hospital Stay: Payer: Medicare Other

## 2021-08-04 ENCOUNTER — Inpatient Hospital Stay (HOSPITAL_BASED_OUTPATIENT_CLINIC_OR_DEPARTMENT_OTHER): Payer: Medicare Other | Admitting: Nurse Practitioner

## 2021-08-04 ENCOUNTER — Inpatient Hospital Stay (HOSPITAL_COMMUNITY)
Admission: AD | Admit: 2021-08-04 | Discharge: 2021-08-08 | DRG: 947 | Disposition: A | Discharge status: Home / Self Care | Payer: Medicare Other | Source: Ambulatory Visit | Attending: Family Medicine | Admitting: Family Medicine

## 2021-08-04 ENCOUNTER — Encounter: Payer: Self-pay | Admitting: Nurse Practitioner

## 2021-08-04 ENCOUNTER — Inpatient Hospital Stay (HOSPITAL_COMMUNITY): Payer: Medicare Other

## 2021-08-04 VITALS — BP 127/74 | HR 111 | Temp 98.2°F | Resp 20 | Ht 70.0 in | Wt 115.6 lb

## 2021-08-04 DIAGNOSIS — Z66 Do not resuscitate: Secondary | ICD-10-CM | POA: Diagnosis present

## 2021-08-04 DIAGNOSIS — R52 Pain, unspecified: Secondary | ICD-10-CM

## 2021-08-04 DIAGNOSIS — D500 Iron deficiency anemia secondary to blood loss (chronic): Secondary | ICD-10-CM | POA: Diagnosis present

## 2021-08-04 DIAGNOSIS — F17210 Nicotine dependence, cigarettes, uncomplicated: Secondary | ICD-10-CM | POA: Diagnosis present

## 2021-08-04 DIAGNOSIS — Z833 Family history of diabetes mellitus: Secondary | ICD-10-CM

## 2021-08-04 DIAGNOSIS — T451X5A Adverse effect of antineoplastic and immunosuppressive drugs, initial encounter: Secondary | ICD-10-CM | POA: Diagnosis present

## 2021-08-04 DIAGNOSIS — I81 Portal vein thrombosis: Secondary | ICD-10-CM | POA: Diagnosis present

## 2021-08-04 DIAGNOSIS — Z7984 Long term (current) use of oral hypoglycemic drugs: Secondary | ICD-10-CM

## 2021-08-04 DIAGNOSIS — Z7901 Long term (current) use of anticoagulants: Secondary | ICD-10-CM | POA: Diagnosis not present

## 2021-08-04 DIAGNOSIS — I480 Paroxysmal atrial fibrillation: Secondary | ICD-10-CM | POA: Diagnosis present

## 2021-08-04 DIAGNOSIS — E063 Autoimmune thyroiditis: Secondary | ICD-10-CM | POA: Diagnosis present

## 2021-08-04 DIAGNOSIS — G893 Neoplasm related pain (acute) (chronic): Secondary | ICD-10-CM | POA: Diagnosis present

## 2021-08-04 DIAGNOSIS — Z9221 Personal history of antineoplastic chemotherapy: Secondary | ICD-10-CM

## 2021-08-04 DIAGNOSIS — D751 Secondary polycythemia: Secondary | ICD-10-CM | POA: Diagnosis present

## 2021-08-04 DIAGNOSIS — Z888 Allergy status to other drugs, medicaments and biological substances status: Secondary | ICD-10-CM

## 2021-08-04 DIAGNOSIS — C189 Malignant neoplasm of colon, unspecified: Secondary | ICD-10-CM

## 2021-08-04 DIAGNOSIS — R630 Anorexia: Secondary | ICD-10-CM

## 2021-08-04 DIAGNOSIS — Z79899 Other long term (current) drug therapy: Secondary | ICD-10-CM

## 2021-08-04 DIAGNOSIS — I10 Essential (primary) hypertension: Secondary | ICD-10-CM | POA: Diagnosis present

## 2021-08-04 DIAGNOSIS — N6310 Unspecified lump in the right breast, unspecified quadrant: Secondary | ICD-10-CM

## 2021-08-04 DIAGNOSIS — C787 Secondary malignant neoplasm of liver and intrahepatic bile duct: Secondary | ICD-10-CM | POA: Diagnosis present

## 2021-08-04 DIAGNOSIS — E43 Unspecified severe protein-calorie malnutrition: Secondary | ICD-10-CM

## 2021-08-04 DIAGNOSIS — Z9049 Acquired absence of other specified parts of digestive tract: Secondary | ICD-10-CM | POA: Diagnosis not present

## 2021-08-04 DIAGNOSIS — E860 Dehydration: Secondary | ICD-10-CM | POA: Diagnosis present

## 2021-08-04 DIAGNOSIS — R109 Unspecified abdominal pain: Secondary | ICD-10-CM | POA: Diagnosis present

## 2021-08-04 DIAGNOSIS — C50411 Malignant neoplasm of upper-outer quadrant of right female breast: Secondary | ICD-10-CM | POA: Diagnosis present

## 2021-08-04 DIAGNOSIS — K5900 Constipation, unspecified: Secondary | ICD-10-CM | POA: Diagnosis present

## 2021-08-04 DIAGNOSIS — R627 Adult failure to thrive: Secondary | ICD-10-CM

## 2021-08-04 DIAGNOSIS — R1110 Vomiting, unspecified: Secondary | ICD-10-CM | POA: Diagnosis not present

## 2021-08-04 DIAGNOSIS — E785 Hyperlipidemia, unspecified: Secondary | ICD-10-CM | POA: Diagnosis present

## 2021-08-04 DIAGNOSIS — C772 Secondary and unspecified malignant neoplasm of intra-abdominal lymph nodes: Secondary | ICD-10-CM | POA: Diagnosis present

## 2021-08-04 DIAGNOSIS — E119 Type 2 diabetes mellitus without complications: Secondary | ICD-10-CM

## 2021-08-04 DIAGNOSIS — Z79890 Hormone replacement therapy (postmenopausal): Secondary | ICD-10-CM

## 2021-08-04 DIAGNOSIS — E038 Other specified hypothyroidism: Secondary | ICD-10-CM | POA: Diagnosis present

## 2021-08-04 DIAGNOSIS — Z803 Family history of malignant neoplasm of breast: Secondary | ICD-10-CM

## 2021-08-04 DIAGNOSIS — Z170 Estrogen receptor positive status [ER+]: Secondary | ICD-10-CM

## 2021-08-04 DIAGNOSIS — Z681 Body mass index (BMI) 19 or less, adult: Secondary | ICD-10-CM

## 2021-08-04 DIAGNOSIS — D72829 Elevated white blood cell count, unspecified: Secondary | ICD-10-CM

## 2021-08-04 DIAGNOSIS — Z923 Personal history of irradiation: Secondary | ICD-10-CM

## 2021-08-04 LAB — URINALYSIS, ROUTINE W REFLEX MICROSCOPIC
Bacteria, UA: NONE SEEN
Bilirubin Urine: NEGATIVE
Glucose, UA: NEGATIVE mg/dL
Hgb urine dipstick: NEGATIVE
Ketones, ur: 80 mg/dL — AB
Leukocytes,Ua: NEGATIVE
Nitrite: NEGATIVE
Protein, ur: 100 mg/dL — AB
Specific Gravity, Urine: 1.027 (ref 1.005–1.030)
pH: 5 (ref 5.0–8.0)

## 2021-08-04 LAB — CBC WITH DIFFERENTIAL (CANCER CENTER ONLY)
Abs Immature Granulocytes: 0.09 10*3/uL — ABNORMAL HIGH (ref 0.00–0.07)
Basophils Absolute: 0.1 10*3/uL (ref 0.0–0.1)
Basophils Relative: 0 %
Eosinophils Absolute: 0 10*3/uL (ref 0.0–0.5)
Eosinophils Relative: 0 %
HCT: 50.8 % — ABNORMAL HIGH (ref 36.0–46.0)
Hemoglobin: 16 g/dL — ABNORMAL HIGH (ref 12.0–15.0)
Immature Granulocytes: 1 %
Lymphocytes Relative: 4 %
Lymphs Abs: 0.8 10*3/uL (ref 0.7–4.0)
MCH: 28 pg (ref 26.0–34.0)
MCHC: 31.5 g/dL (ref 30.0–36.0)
MCV: 89 fL (ref 80.0–100.0)
Monocytes Absolute: 0.6 10*3/uL (ref 0.1–1.0)
Monocytes Relative: 3 %
Neutro Abs: 17.2 10*3/uL — ABNORMAL HIGH (ref 1.7–7.7)
Neutrophils Relative %: 92 %
Platelet Count: 208 10*3/uL (ref 150–400)
RBC: 5.71 MIL/uL — ABNORMAL HIGH (ref 3.87–5.11)
RDW: 18.6 % — ABNORMAL HIGH (ref 11.5–15.5)
WBC Count: 18.7 10*3/uL — ABNORMAL HIGH (ref 4.0–10.5)
nRBC: 0 % (ref 0.0–0.2)

## 2021-08-04 LAB — CMP (CANCER CENTER ONLY)
ALT: 12 U/L (ref 0–44)
AST: 22 U/L (ref 15–41)
Albumin: 4.6 g/dL (ref 3.5–5.0)
Alkaline Phosphatase: 149 U/L — ABNORMAL HIGH (ref 38–126)
Anion gap: 16 — ABNORMAL HIGH (ref 5–15)
BUN: 25 mg/dL — ABNORMAL HIGH (ref 8–23)
CO2: 29 mmol/L (ref 22–32)
Calcium: 10.8 mg/dL — ABNORMAL HIGH (ref 8.9–10.3)
Chloride: 92 mmol/L — ABNORMAL LOW (ref 98–111)
Creatinine: 0.56 mg/dL (ref 0.44–1.00)
GFR, Estimated: >60 mL/min (ref >60)
Glucose, Bld: 145 mg/dL — ABNORMAL HIGH (ref 70–99)
Potassium: 3.8 mmol/L (ref 3.5–5.1)
Sodium: 137 mmol/L (ref 135–145)
Total Bilirubin: 0.6 mg/dL (ref 0.3–1.2)
Total Protein: 7.7 g/dL (ref 6.5–8.1)

## 2021-08-04 LAB — PROTIME-INR
INR: 3.3 — ABNORMAL HIGH (ref 0.8–1.2)
Prothrombin Time: 33 seconds — ABNORMAL HIGH (ref 11.4–15.2)

## 2021-08-04 LAB — GLUCOSE, CAPILLARY: Glucose-Capillary: 125 mg/dL — ABNORMAL HIGH (ref 70–99)

## 2021-08-04 MED ORDER — ACETAMINOPHEN 325 MG PO TABS: 650.0000 mg | ORAL_TABLET | Freq: Four times a day (QID) | ORAL | Status: DC | PRN

## 2021-08-04 MED ORDER — POLYETHYLENE GLYCOL 3350 17 G PO PACK
17.0000 g | PACK | Freq: Every day | ORAL | Status: DC
  Administered 2021-08-04: 17 g via ORAL
  Filled 2021-08-04: qty 1

## 2021-08-04 MED ORDER — OXYCODONE HCL 5 MG PO TABS: 5.0000 mg | ORAL_TABLET | Freq: Once | ORAL | Status: DC

## 2021-08-04 MED ORDER — HYDROCODONE-ACETAMINOPHEN 5-325 MG PO TABS
1.0000 | ORAL_TABLET | Freq: Four times a day (QID) | ORAL | Status: DC | PRN
Start: 2021-08-04 — End: 2021-08-07
  Administered 2021-08-04 – 2021-08-07 (×7): 1 via ORAL
  Filled 2021-08-04 (×7): qty 1

## 2021-08-04 MED ORDER — LACTATED RINGERS IV SOLN: INTRAVENOUS | Status: DC

## 2021-08-04 MED ORDER — OXYCODONE HCL 5 MG PO TABS
5.0000 mg | ORAL_TABLET | Freq: Once | ORAL | Status: AC
  Administered 2021-08-04: 5 mg via ORAL
  Filled 2021-08-04: qty 1

## 2021-08-04 MED ORDER — BISACODYL 10 MG RE SUPP: 10.0000 mg | Freq: Every day | RECTAL | Status: DC | PRN

## 2021-08-04 MED ORDER — PANTOPRAZOLE SODIUM 40 MG PO TBEC
40.0000 mg | DELAYED_RELEASE_TABLET | Freq: Two times a day (BID) | ORAL | Status: DC
  Administered 2021-08-04 – 2021-08-08 (×8): 40 mg via ORAL
  Filled 2021-08-04 (×8): qty 1

## 2021-08-04 MED ORDER — LEVOTHYROXINE SODIUM 100 MCG PO TABS
100.0000 ug | ORAL_TABLET | Freq: Every day | ORAL | Status: DC
  Administered 2021-08-05 – 2021-08-08 (×4): 100 ug via ORAL
  Filled 2021-08-04 (×4): qty 1

## 2021-08-04 MED ORDER — ONDANSETRON HCL 4 MG PO TABS: 4.0000 mg | ORAL_TABLET | Freq: Four times a day (QID) | ORAL | Status: DC | PRN

## 2021-08-04 MED ORDER — ONDANSETRON HCL 4 MG/2ML IJ SOLN
4.0000 mg | Freq: Four times a day (QID) | INTRAMUSCULAR | Status: DC | PRN
  Administered 2021-08-04 – 2021-08-05 (×2): 4 mg via INTRAVENOUS
  Filled 2021-08-04 (×2): qty 2

## 2021-08-04 MED ORDER — ACETAMINOPHEN 650 MG RE SUPP
650.0000 mg | Freq: Four times a day (QID) | RECTAL | Status: DC | PRN
Start: 2021-08-04 — End: 2021-08-08

## 2021-08-04 MED ORDER — MAGNESIUM HYDROXIDE 400 MG/5ML PO SUSP: 30.0000 mL | Freq: Every day | ORAL | Status: DC | PRN

## 2021-08-04 MED ORDER — FLECAINIDE ACETATE 100 MG PO TABS
100.0000 mg | ORAL_TABLET | Freq: Two times a day (BID) | ORAL | Status: DC
  Administered 2021-08-04 – 2021-08-08 (×8): 100 mg via ORAL
  Filled 2021-08-04 (×9): qty 1

## 2021-08-04 MED ORDER — INSULIN ASPART 100 UNIT/ML IJ SOLN
0.0000 [IU] | Freq: Three times a day (TID) | INTRAMUSCULAR | Status: DC
  Administered 2021-08-06: 1 [IU] via SUBCUTANEOUS
  Administered 2021-08-07: 2 [IU] via SUBCUTANEOUS

## 2021-08-04 MED ORDER — ANASTROZOLE 1 MG PO TABS
1.0000 mg | ORAL_TABLET | Freq: Every day | ORAL | Status: DC
  Administered 2021-08-05 – 2021-08-08 (×4): 1 mg via ORAL
  Filled 2021-08-04 (×4): qty 1

## 2021-08-04 MED ORDER — MORPHINE SULFATE (PF) 2 MG/ML IV SOLN
2.0000 mg | INTRAVENOUS | Status: DC | PRN
  Administered 2021-08-04 – 2021-08-08 (×9): 2 mg via INTRAVENOUS
  Filled 2021-08-04 (×9): qty 1

## 2021-08-04 NOTE — H&P (Signed)
?History and Physical  ? ? ?[name] GYI:[id number] DOB: 10-17-1950 DOA: 08/04/2021 ? ?PCP: [name], NP  ? ?Patient coming from: oncology office ?Chief complaints:abdomen pain ? ?HPI:71 year old female colon cancer, history of right colectomy 04/13/2020, metastasis to right liver and periportal lymph retroperitoneal nodal disease-decreasing tumor size on last CT on 07/10/2021,on active treatment with FOLFIRI, last treated 07/14/2369,  stable portal vein thrombosis, PAF on Coumadin, T2DM, iron-deficiency anemia, history of right breast cancer status postlumpectomy and adjuvant radiation and chemo, hypothyroidism, ongoing, diabetes, oxaliplatin neuropathy had marked decline in past week  with deep achy pain in upper abdomen, constipation x 1 wk, generalized weakness, anorexia and low back pain. Patient endorses she has lost more than 12 lb In 3 wk, has had anorexia, low back pain.  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 ?She denies any fever, chills, nausea, vomiting, headache, chest pain, focal weakness.   ? ?She was seen at the oncology center today for a follow-up appointment preceding with chemotherapy found to have severe dehydration and sent to the hospital for admission and symptom management and suspected to have progressive cancer, also noted to have palpable right breast mass on exam.  ? ?AT cancer center today routine blood work showed stable renal function with mildly increased anion gap, leukocytosis, INR 3.3.  Imaging and imaging not done yet. ?  ?Assessment/Plan ?Principal Problem: ?  Intractable pain ?Active Problems: ?  Paroxysmal atrial fibrillation ([place]) ?  Long term (current) use of anticoagulants [Z79.01] ?  Benign essential hypertension ?  Hypothyroidism due to Hashimoto's thyroiditis ?  Type 2 diabetes mellitus without complication, without long-term current use of insulin ([place]) ?  Iron deficiency anemia due to chronic blood loss ?  Metastatic colon cancer to  liver [facility]) ?  Dehydration ?  Failure to thrive in adult ?  Anorexia ?  Leukocytosis ?  Pain in the abdomen ?  Protein-calorie malnutrition, severe ([place]) ?  Breast mass, right ? ?Intractable pain in abdomen,back pain ?Anorexia ?Weight loss ?Failure to thrive ?Metastatic colon cancer ?Severe protein calorie malnutrition: ?Patient presents in likely multifactorial in the setting of dehydration, possible progression of her metastatic disease.For now we will hydrate with IV fluids, pain management-IV and oral opiates, antiemetics.  Dr. Learta Codding to see the patient in the morning and will likely obtain CT scan chest abdomen pelvis after hydration overnight.  Dietitian consulted. ? ?Constipation x1 week:Add laxatives.Hydrate and plan for imaging in the morning. ? ?Leukocytosis: Patient denies any fever no respiratory symptoms urinary symptoms.  Could be in the setting of metastatic disease versus dehydration.  Check UA and chest x-ray for completeness.  Hydrate and repeat CBC in a.m. ? ?Right breast mass appears to be new oncology to decide imaging in the morning, given history of lumpectomy on the right, on anastrozole. ? ?Paroxysmal atrial fibrillation: On Coumadin therapeutic pharmacy to dose.  Resume flecainide, does not take digoxin and metoprolol anymore. ? ?Benign essential hypertension: BP stable. ? ?Hypothyroidism: Resume Synthroid ? ?Type 2 diabetes mellitus without complication, without long-term current use of insulin: Hold metformin, check A1c, start sliding scale insulin ? ?Iron deficiency anemia due to chronic blood loss: Hemoglobin stable.  Monitor ? ?Goals of care discussed with the patient and daughter at the bedside, she wishes for DNR. ? ?There is no height or weight on file to calculate BMI.  ? ?Severity of Illness: ?The appropriate patient status for this patient is INPATIENT. Inpatient status is judged to be reasonable  and necessary in order to provide the required intensity of service to ensure the  patient's safety. The patient's presenting symptoms, physical exam findings, and initial radiographic and laboratory data in the context of their chronic comorbidities is felt to place them at high risk for further clinical deterioration. Furthermore, it is not anticipated that the patient will be medically stable for discharge from the hospital within 2 midnights of admission.  ? ?* I certify that at the point of admission it is my clinical judgment that the patient will require inpatient hospital care spanning beyond 2 midnights from the point of admission due to high intensity of service, high risk for further deterioration and high frequency of surveillance required.*  ? ?DVT prophylaxis: coumadin ?Code Status:   Code Status: DNR  ?Family Communication: Admission, patients condition and plan of care including tests being ordered have been discussed with the patient and daughter who indicate understanding and agree with the plan and Code Status. ? ?Consults called:  ?Dr Learta Codding ? ?Review of Systems: All systems were reviewed and were negative except as mentioned in HPI above. ?Negative for fever ?Negative for chest pain ?Negative for shortness of breath ? ?Past Medical History:  ?Diagnosis Date  ? Atrial fibrillation ([place]) [I48.91] 11/08/2016  ? Benign essential hypertension 08/25/2015  ? Colon cancer ([place]) 03/18/2020  ? Diabetes ([place])   ? Diabetes mellitus ([place]) 06/12/2018  ? Dyslipidemia 02/13/2015  ? Dysrhythmia   ? AFIB  ? Encounter for therapeutic drug monitoring 11/08/2016  ? Family history of breast cancer   ? Family history of leukemia   ? Family history of prostate cancer   ? Genetic testing 07/05/2018  ? Negative genetic testing on the common hereditary cancer panel.  The Hereditary Gene Panel offered by [facility] includes sequencing and/or deletion duplication testing of the following 48 genes: APC, ATM, AXIN2, BARD1, BMPR1A, BRCA1, BRCA2, BRIP1, CDH1, CDK4, CDKN2A (p14ARF), CDKN2A (p16INK4a), CHEK2, CTNNA1,  DICER1, EPCAM (Deletion/duplication testing only), GREM1 (promoter region deletion/duplicat  ? Hyperlipidemia   ? Hypothyroidism due to Hashimoto's thyroiditis 05/23/2015  ? Iron deficiency anemia due to chronic blood loss 12/26/2019  ? Long term (current) use of anticoagulants [Z79.01] 11/08/2016  ? Malignant neoplasm of upper-outer quadrant of right breast in female, estrogen receptor positive ([place]) 06/26/2018  ? Moderate episode of recurrent major depressive disorder ([place]) 12/01/2015  ? Paroxysmal atrial fibrillation ([place]) 02/13/2015  ? Formatting of this note might be different from the original. Chads score 1, chads vas 2, anticoagulated with warfarin  ? Personal history of chemotherapy   ? Personal history of radiation therapy   ? Port-A-Cath in place 08/24/2018  ? Preop cardiovascular exam 07/05/2018  ? Negative genetic testing on the common hereditary cancer panel.  The Hereditary Gene Panel offered by [facility] includes sequencing and/or deletion duplication testing of the following 48 genes: APC, ATM, AXIN2, BARD1, BMPR1A, BRCA1, BRCA2, BRIP1, CDH1, CDK4, CDKN2A (p14ARF), CDKN2A (p16INK4a), CHEK2, CTNNA1, DICER1, EPCAM (Deletion/duplication testing only), GREM1 (promoter region deletion/duplicat  ? Smoking 07/12/2017  ? Statin intolerance 12/08/2016  ? Type 2 diabetes mellitus without complication, without long-term current use of insulin ([place]) 05/23/2015  ? ? ?Past Surgical History:  ?Procedure Laterality Date  ? ABDOMINAL HYSTERECTOMY    ? APPENDECTOMY    ? BREAST LUMPECTOMY Right 07/13/2018  ? BREAST LUMPECTOMY WITH RADIOACTIVE SEED AND SENTINEL LYMPH NODE BIOPSY Right 07/13/2018  ? Procedure: RIGHT BREAST LUMPECTOMY WITH RADIOACTIVE SEED AND RIGHT SENTINEL LYMPH NODE MAPPING;  Surgeon: Erroll Luna, MD;  Location:  [facility];  Service: General;  Laterality: Right;  ? BREAST SURGERY    ? CHOLECYSTECTOMY    ? KNEE SURGERY    ? PORT-A-CATH REMOVAL N/A 08/21/2019  ? Procedure: REMOVAL PORT-A-CATH;   Surgeon: Erroll Luna, MD;  Location: [facility];  Service: General;  Laterality: N/A;  ? PORTACATH PLACEMENT Right 08/02/2018  ? Procedure: INSERTION PORT-A-CATH WITH ULTRASOUND;  Surgeon: Malachi Paradise

## 2021-08-04 NOTE — Hospital Course (Addendum)
71 year old female colon cancer, history of right colectomy 04/13/2020, metastasis to right liver and periportal lymph retroperitoneal nodal disease-decreasing tumor size on last CT on 07/10/2021,on active treatment with FOLFIRI, last treated 07/15/2314,  stable portal vein thrombosis, PAF on Coumadin, T2DM, iron-deficiency anemia, history of right breast cancer status postlumpectomy and adjuvant radiation and chemo, hypothyroidism, ongoing, diabetes, oxaliplatin neuropathy had marked decline in past week  with deep achy pain in upper abdomen, constipation x 1 wk, generalized weakness, anorexia and low back pain. Patient endorses she has lost more than 12 lb In 3 wk, has had anorexia, low back pain.  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 ?She denies any fever, chills, nausea, vomiting, headache, chest pain, focal weakness.   ?She was seen at the oncology center or a follow-up appointment preceding with chemotherapy found to have severe dehydration and sent to the hospital for admission and symptom management and suspected to have progressive cancer, also noted to have palpable right breast mass on exam.  ?AT cancer center routine blood work showed stable renal function with mildly increased anion gap, leukocytosis, INR 3.3. ? ?

## 2021-08-04 NOTE — Progress Notes (Deleted)
Plan of Care Note for accepted transfer   Patient: Sabrina [name] MRN: [id number]   DOA: (Not on file)  Facility requesting transfer: [facility]. Requesting Provider: Betsy Coder, MD Reason for transfer: Intractable abdominal and back pain. Facility course:  71 y.o. female with a past medical history significant for anemia, a.fib (on Warfarin), portal vein thrombosis, aortic atherosclerosis, splenomegaly, DM, HLD, HTN, breast cancer and metastatic colon cancer who presented for her follow-up at the [facility] oncology clinic with complaints of severe abdominal and back pain.  Labs today significant for hypercalcemia of 10.8 mg/dL.  Her white count has increased from 13.9 about 3 weeks ago to 18.7 today. Dr. Learta Codding has requested that we evaluate the patient and treat her pain initially in the hospital given severity. She does not look toxic.  Vital signs are stable at the oncology clinic.  Accepted to MedSurg bed..  Plan of care: The patient is accepted for admission to [place]  unit, at [facility].  Author: Reubin Milan, MD 08/04/2021  Check [email or web address] for on-call coverage.  Nursing staff, Please call [place] number on Amion as soon as patient's arrival, so appropriate admitting provider can evaluate the pt.

## 2021-08-04 NOTE — Progress Notes (Signed)
?[place] ?OFFICE PROGRESS NOTE ? ? ?Diagnosis: Colon cancer ? ?INTERVAL HISTORY:  ? ?[name] returns as scheduled.  She completed cycle 11 FOLFIRI 07/14/2021.  She is having increased generalized abdominal pain.  She is constipated.  She has tried sorbitol, MiraLAX and Dulcolax.  She is unable to eat.  Her daughter reports she stays in bed all day.  She has not vomited but the sight or smell of food makes her nauseated.  ?Objective: ? ?Vital signs in last 24 hours: ? ?Blood pressure 127/74, pulse (!) 111, temperature 98.2 ?F (36.8 ?C), temperature source Oral, resp. rate 20, height 5' 10"  (1.778 m), weight 115 lb 9.6 oz (52.4 kg), SpO2 98 %. ?  ? ?Resp: Faint scattered wheezes.  No respiratory distress. ?Cardio: Regular, tachycardic. ?GI: Abdomen is soft.  Generalized abdominal tenderness.  Hypoactive bowel sounds. ?Vascular: No leg edema. ?Neuro: Alert and oriented. ?Breast: Approximate 3 cm palpable mass right upper outer quadrant at the 10 to 11 o'clock position 1 to 2 cm from the lumpectomy scar. ?Skin: Decreased skin turgor. ?Port-A-Cath without erythema. ? ? ?Lab Results: ? ?Lab Results  ?Component Value Date  ? WBC 18.7 (H) 08/04/2021  ? HGB 16.0 (H) 08/04/2021  ? HCT 50.8 (H) 08/04/2021  ? MCV 89.0 08/04/2021  ? PLT 208 08/04/2021  ? NEUTROABS 17.2 (H) 08/04/2021  ? ? ?Imaging: ? ?No results found. ? ?Medications: I have reviewed the patient's current medications. ? 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, stage IIIc pT3pN2b ?Moderately differentiated adenocarcinoma, 9/30 lymph nodes positive, lymphovascular invasion present including large vessel extramural invasion, loss of MLH1 and PMS2, MLH1 hyper methylation present ?Incomplete colonoscopy secondary to stricturing at the sigmoid colon ?Virtual colonoscopy 03/13/2020- mass centered at the medial wall of the cecum and terminal ileum, adjacent ileocolic mesenteric adenopathy, nonspecific 3 mm right  lower lobe nodule ?PET 61/60/7371 hypermetabolic cecal/terminal ileum mass with adjacent enlarged and hypermetabolic ileocolic lymph nodes, previously noted segment 7 liver lesion-not hypermetabolic- benign etiology favored, dominant left lower lobe pulmonary nodule not hypermetabolic, other tiny nodules not hypermetabolic and below PET sensitivity ?Cycle 1 FOLFOX 05/12/2020 ?Cycle 2 FOLFOX 05/26/2020 ?Cycle 3 FOLFOX 06/09/2020 ?Cycle 4 FOLFOX 06/23/2020 ?Cycle 5 FOLFOX 07/07/2020 ?Cycle 12 FOLFOX 10/14/2020, oxaliplatin dose reduced beginning with cycle 9 ?CT 12/04/2020-development of peripheral enhancing hypoattenuating mass in segment 5/8, necrotic porta hepatis lymphadenopathy, similar appearance of previously visualized nonhypermetabolic subcapsular segment 7 lesion ?Ultrasound-guided biopsy right liver lesion 12/22/2020-adenocarcinoma consistent with a colon primary ?PET scan 12/30/2020-hypermetabolic hepatic lesion and portal adenopathy-increased in size compared to 12/04/2020 CT, small hypermetabolic left periaortic node ?Foundation 1-MSI high, tumor mutation burden 31, K-ras wild-type ?Cycle 1 Pembrolizumab 01/15/2021 ?Cycle 1 FOLFIRI 02/16/2021 ?Cycle 2 FOLFIRI 03/02/2021-Emend and Udenyca added ?Cycle 3 FOLFIRI 03/17/2021 ?Cycle 4 FOLFIRI 03/31/2021 ?Cycle 5 FOLFIRI 04/15/2021 ?CT abdomen/pelvis 04/23/2021-mild decrease in size of right hepatic lobe metastases, mild decrease in porta hepatis and portacaval lymphadenopathy, mild increase in left periaortic lymphadenopathy ?Cycle 6 FOLFIRI 04/28/2021 ?Cycle 7 FOLFIRI 05/12/2021 ?Cycle 8 FOLFIRI 05/26/2021 ?Cycle 9 FOLFIRI 06/09/2021 ?Cycle 10 FOLFIRI 06/30/2021 ?CTs 07/10/2021-decrease in size of right hepatic lobe metastatic lesion and periportal and retroperitoneal nodal disease.  No new or progressive findings. ?Cycle 11 FOLFIRI 07/14/2021 ?Iron deficiency anemia secondary to #1 ?Right breast cancer, stage Ia, T1b, N0, ER positive, PR positive, HER2 positive status post a right  lumpectomy and adjuvant radiation/Taxol/Herceptin, anastrozole starting 01/25/2019 ?Diabetes ?Atrial fibrillation-maintained on Coumadin, Coumadin dose adjusted 03/02/2021 ?Hypothyroidism ?Ongoing tobacco  use ?Family history of breast cancer-negative genetic testing ?Oxaliplatin neuropathy ?Intermittent upper abdominal discomfort-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  ? ?Disposition: Sabrina [name] presents for routine follow-up before proceeding with chemotherapy.  She is currently on active treatment with FOLFIRI.  She was last treated on 07/14/2021.  Since then she has had a marked decline in her performance status.  She has worsening abdominal pain, constipation, anorexia/weight loss.  She appears dehydrated.  We are making arrangements for hospitalization for treatment of dehydration and further evaluation of other symptoms.  She understands her symptoms may be due to progressive cancer. ? ?She is noted to have a palpable right breast mass on exam today.  Etiology unclear.  Further evaluation will be needed. ? ?Patient seen with Dr. Benay Spice. ? ? ?Ned Card ANP/GNP-BC  ? ?08/04/2021  ?10:47 AM ? ?This was a shared visit with Ned Card.  Sabrina [name] was interviewed and examined.  She has developed progressive abdominal pain, anorexia, and weight loss over the past few weeks.  She requests hospital admission for further evaluation.  She appears dehydrated. ? ?There is a new palpable mass in the right breast.  The breast mass may be related to recurrence of breast cancer, a new breast primary, or metastatic colon cancer. ? ?The abdominal pain may be related to progression of metastatic colon cancer or another etiology. ? ?I will see her in the hospital 08/05/2021. ? ?Julieanne Manson, MD ? ? ? ? ? ?

## 2021-08-04 NOTE — Plan of Care (Signed)

## 2021-08-04 NOTE — Progress Notes (Signed)
ANTICOAGULATION CONSULT NOTE  ? ?Pharmacy Consult for warfarin ?Indication: hx atrial fibrillation ? ?Allergies  ?Allergen Reactions  ? Atorvastatin Other (See Comments)  ?  Muscle aches  ? Meperidine Nausea And Vomiting  ? ? ?Patient Measurements: ?  ?Heparin Dosing Weight:  ? ?Vital Signs: ?Temp: 98 ?F (36.7 ?C) (04/25 1708) ?Temp Source: Oral (04/25 1708) ?BP: 158/94 (04/25 1708) ?Pulse Rate: 85 (04/25 1708) ? ?Labs: ?Recent Labs  ?  08/04/21 ?1015  ?HGB 16.0*  ?HCT 50.8*  ?PLT 208  ?LABPROT 33.0*  ?INR 3.3*  ?CREATININE 0.56  ? ? ?Estimated Creatinine Clearance: 54.1 mL/min (by C-G formula based on SCr of 0.56 mg/dL). ? ? ?Medications:  ?- PTA warfarin regimen: per pt, 10 mg daily except '5mg'$  on Mon, Wed and Sat (Last dose taken on 08/03/21) ? ?Assessment: ?Patient is a 71 y.o F with metastatic colon cancer  on chemotheraoy and hx afib on warfarin PTA who was admitted to [facility] on 08/04/21 for evaluation of abdominal pain.  Pharmacy has been consulted to resume warfarin on admission. ? ?Today, 08/04/2021: ?- INR is supra-therapeutic at 3.3. Pt reported poor oral intake so this could make her more sensitive to warfarin. ?- cbc ok ?- no bleeding documented ? ? ? ?Goal of Therapy:  ?INR 2-3 ?Monitor platelets by anticoagulation protocol: Yes ?  ?Plan:  ?- will hold dose today due to supra-therapeutic INR ?- f/u with INR on 4/26 and resume if/when appropriate ? ?Sabrina [name] P ?08/04/2021,6:12 PM ? ? ?

## 2021-08-05 ENCOUNTER — Other Ambulatory Visit: Payer: Self-pay

## 2021-08-05 ENCOUNTER — Encounter (HOSPITAL_COMMUNITY): Payer: Self-pay | Admitting: Internal Medicine

## 2021-08-05 ENCOUNTER — Inpatient Hospital Stay (HOSPITAL_COMMUNITY): Payer: Medicare Other

## 2021-08-05 DIAGNOSIS — R52 Pain, unspecified: Secondary | ICD-10-CM | POA: Diagnosis not present

## 2021-08-05 LAB — GLUCOSE, CAPILLARY
Glucose-Capillary: 93 mg/dL (ref 70–99)
Glucose-Capillary: 102 mg/dL — ABNORMAL HIGH (ref 70–99)
Glucose-Capillary: 109 mg/dL — ABNORMAL HIGH (ref 70–99)
Glucose-Capillary: 101 mg/dL — ABNORMAL HIGH (ref 70–99)

## 2021-08-05 LAB — HIV ANTIBODY (ROUTINE TESTING W REFLEX): HIV Screen 4th Generation wRfx: NONREACTIVE

## 2021-08-05 LAB — COMPREHENSIVE METABOLIC PANEL
ALT: 14 U/L (ref 0–44)
AST: 24 U/L (ref 15–41)
Albumin: 3.9 g/dL (ref 3.5–5.0)
Alkaline Phosphatase: 128 U/L — ABNORMAL HIGH (ref 38–126)
Anion gap: 10 (ref 5–15)
BUN: 23 mg/dL (ref 8–23)
CO2: 33 mmol/L — ABNORMAL HIGH (ref 22–32)
Calcium: 9.3 mg/dL (ref 8.9–10.3)
Chloride: 95 mmol/L — ABNORMAL LOW (ref 98–111)
Creatinine, Ser: 0.42 mg/dL — ABNORMAL LOW (ref 0.44–1.00)
GFR, Estimated: >60 mL/min (ref >60)
Glucose, Bld: 101 mg/dL — ABNORMAL HIGH (ref 70–99)
Potassium: 3.5 mmol/L (ref 3.5–5.1)
Sodium: 138 mmol/L (ref 135–145)
Total Bilirubin: 1.1 mg/dL (ref 0.3–1.2)
Total Protein: 6.7 g/dL (ref 6.5–8.1)

## 2021-08-05 LAB — CBC
HCT: 43.2 % (ref 36.0–46.0)
Hemoglobin: 13.9 g/dL (ref 12.0–15.0)
MCH: 28.7 pg (ref 26.0–34.0)
MCHC: 32.2 g/dL (ref 30.0–36.0)
MCV: 89.3 fL (ref 80.0–100.0)
Platelets: 158 10*3/uL (ref 150–400)
RBC: 4.84 MIL/uL (ref 3.87–5.11)
RDW: 17.8 % — ABNORMAL HIGH (ref 11.5–15.5)
WBC: 11.2 10*3/uL — ABNORMAL HIGH (ref 4.0–10.5)
nRBC: 0 % (ref 0.0–0.2)

## 2021-08-05 LAB — PROTIME-INR
INR: 3.1 — ABNORMAL HIGH (ref 0.8–1.2)
Prothrombin Time: 31.6 seconds — ABNORMAL HIGH (ref 11.4–15.2)

## 2021-08-05 LAB — HEMOGLOBIN A1C
Hgb A1c MFr Bld: 5.8 % — ABNORMAL HIGH (ref 4.8–5.6)
Mean Plasma Glucose: 119.76 mg/dL

## 2021-08-05 MED ORDER — SORBITOL 70 % SOLN
15.0000 mL | Freq: Two times a day (BID) | Status: DC
  Administered 2021-08-05 – 2021-08-08 (×6): 15 mL via ORAL
  Filled 2021-08-05 (×7): qty 30

## 2021-08-05 MED ORDER — PROSOURCE PLUS PO LIQD
30.0000 mL | Freq: Two times a day (BID) | ORAL | Status: DC
  Administered 2021-08-05 – 2021-08-08 (×5): 30 mL via ORAL
  Filled 2021-08-05 (×5): qty 30

## 2021-08-05 MED ORDER — ENSURE MAX PROTEIN PO LIQD
11.0000 [oz_av] | Freq: Two times a day (BID) | ORAL | Status: DC
  Administered 2021-08-05 – 2021-08-08 (×2): 11 [oz_av] via ORAL
  Filled 2021-08-05 (×7): qty 330

## 2021-08-05 MED ORDER — CHLORHEXIDINE GLUCONATE CLOTH 2 % EX PADS
6.0000 | MEDICATED_PAD | Freq: Every day | CUTANEOUS | Status: DC
  Administered 2021-08-05 – 2021-08-08 (×4): 6 via TOPICAL

## 2021-08-05 MED ORDER — ADULT MULTIVITAMIN W/MINERALS CH
1.0000 | ORAL_TABLET | Freq: Every day | ORAL | Status: DC
  Administered 2021-08-05 – 2021-08-08 (×4): 1 via ORAL
  Filled 2021-08-05 (×4): qty 1

## 2021-08-05 MED ORDER — ORAL CARE MOUTH RINSE
15.0000 mL | Freq: Two times a day (BID) | OROMUCOSAL | Status: DC
  Administered 2021-08-05 – 2021-08-08 (×6): 15 mL via OROMUCOSAL

## 2021-08-05 MED ORDER — BOOST / RESOURCE BREEZE PO LIQD CUSTOM
1.0000 | Freq: Three times a day (TID) | ORAL | Status: DC
  Administered 2021-08-08: 1 via ORAL

## 2021-08-05 MED ORDER — SENNOSIDES-DOCUSATE SODIUM 8.6-50 MG PO TABS
1.0000 | ORAL_TABLET | Freq: Two times a day (BID) | ORAL | Status: DC
  Administered 2021-08-05 – 2021-08-08 (×6): 1 via ORAL
  Filled 2021-08-05 (×7): qty 1

## 2021-08-05 NOTE — Progress Notes (Signed)
Initial Nutrition Assessment ? ?DOCUMENTATION CODES:  ? ?Severe malnutrition in context of chronic illness, Underweight ? ?INTERVENTION:  ? ?-Ensure MAX Protein po BID, each supplement provides 150 kcal and 30 grams of protein   ? ?-Prosource Plus PO BID, each provides 100 kcals and 15g protein ? ?-Multivitamin with minerals daily ? ?NUTRITION DIAGNOSIS:  ? ?Severe Malnutrition related to chronic illness, cancer and cancer related treatments as evidenced by severe fat depletion, severe muscle depletion, percent weight loss. ? ?GOAL:  ? ?Patient will meet greater than or equal to 90% of their needs ? ?MONITOR:  ? ?PO intake, Labs, Weight trends, Supplement acceptance, I & O's ? ?REASON FOR ASSESSMENT:  ? ?Consult ?Assessment of nutrition requirement/status ? ?ASSESSMENT:  ? ?71 year old female colon cancer, history of right colectomy 04/13/2020, metastasis to right liver and periportal lymph retroperitoneal nodal disease-decreasing tumor size on last CT on 07/10/2021,on active treatment with FOLFIRI, last treated 07/15/2314,  stable portal vein thrombosis, PAF on Coumadin, T2DM, iron-deficiency anemia, history of right breast cancer status postlumpectomy and adjuvant radiation and chemo, hypothyroidism, ongoing, diabetes, oxaliplatin neuropathy had marked decline in past week  with deep achy pain in upper abdomen, constipation x 1 wk, generalized weakness, anorexia and low back pain. Patient endorses she has lost more than 12 lb In 3 wk, has had anorexia, low back pain.  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 ? ?Patient in room, was on clears at time of visit. Pt states she had difficulty tolerating chicken broth. Had not eaten anything else today. Mainly drinking water.  ?Pt states she had not eaten a "real" meal since 2 weeks ago. Has been subsisting mainly on a little bit of soups, mashed potatoes and ate 4 crackers on 4/22. Tried to drink Premier Protein at home but eventually she was unable to  drink 1 daily.  ?Denies issues with swallowing or chewing.  ?Has been constipated lately.  ? ?Pt reports weighing 142 lbs ~6 months ago.  ?Per weight records, pt has lost 31 lbs since 1/17 (22% wt loss x 3 months, significant for time frame).  ? ?Medications: Senokot, Lactated ringers, IV Zofran ? ?Labs reviewed: ? CBGs: 93-125 ? ?NUTRITION - FOCUSED PHYSICAL EXAM: ? ?Flowsheet Row Most Recent Value  ?Orbital Region Mild depletion  ?Upper Arm Region Severe depletion  ?Thoracic and Lumbar Region Severe depletion  ?Buccal Region Moderate depletion  ?Temple Region Severe depletion  ?Clavicle Bone Region Severe depletion  ?Clavicle and Acromion Bone Region Severe depletion  ?Scapular Bone Region Severe depletion  ?Dorsal Hand Severe depletion  ?Patellar Region Moderate depletion  ?Anterior Thigh Region Moderate depletion  ?Posterior Calf Region Moderate depletion  ?Edema (RD Assessment) None  ?Hair Reviewed  [reports hair loss from chemo]  ?Eyes Reviewed  [redness around orbital area]  ?Mouth Reviewed  ?Skin Reviewed  [dry]  ?Nails Reviewed  ? ?  ? ? ?Diet Order:   ?Diet Order   ? ?       ?  Diet full liquid Room service appropriate? Yes; Fluid consistency: Thin  Diet effective now       ?  ? ?  ?  ? ?  ? ? ?EDUCATION NEEDS:  ? ?Education needs have been addressed ? ?Skin:  Skin Assessment: Reviewed RN Assessment ? ?Last BM:  PTA ? ?Height:  ? ?Ht Readings from Last 1 Encounters:  ?08/04/21 '5\' 10"'$  (1.778 m)  ? ? ?Weight:  ? ?Wt Readings from Last 1 Encounters:  ?08/05/21  50.5 kg  ? ?BMI:  Body mass index is 15.98 kg/m?. ? ?Estimated Nutritional Needs:  ? ?Kcal:  1500-1700 ? ?Protein:  75-85g ? ?Fluid:  1.7L/day ? ?Clayton Bibles, MS, RD, LDN ?Inpatient Clinical Dietitian ?Contact information available via Amion ? ?

## 2021-08-05 NOTE — Progress Notes (Addendum)
IP PROGRESS NOTE ? ?Subjective:  ? ?Ms. Franson continues to have back pain.  She reports improved abdominal pain with morphine.  She had an episode of emesis after taking MiraLAX.  No bowel movement. ? ?Objective: ?Vital signs in last 24 hours: ?Blood pressure (!) 170/90, pulse 71, temperature 98 ?F (36.7 ?C), temperature source Oral, resp. rate 18, height 5' 10"  (1.778 m), weight 107 lb 9.6 oz (48.8 kg), SpO2 92 %. ? ?Intake/Output from previous day: ?04/25 0701 - 04/26 0700 ?In: 965 [P.O.:240; I.V.:725] ?Out: -  ? ?Physical Exam: ? ?HEENT: No thrush ?Lungs: Scattered inspiratory/expiratory rhonchi, no respiratory distress ?Cardiac: Regular rate and rhythm ?Abdomen: Nontender, no masses, no hepatosplenomegaly ?Extremities: No leg edema ?Breast: 2-3 cm mass at the upper outer right breast ? ?Portacath/PICC-without erythema ? ?Lab Results: ?Recent Labs  ?  08/04/21 ?1015 08/05/21 ?0454  ?WBC 18.7* 11.2*  ?HGB 16.0* 13.9  ?HCT 50.8* 43.2  ?PLT 208 158  ? ? ?BMET ?Recent Labs  ?  08/04/21 ?1015 08/05/21 ?0454  ?NA 137 138  ?K 3.8 3.5  ?CL 92* 95*  ?CO2 29 33*  ?GLUCOSE 145* 101*  ?BUN 25* 23  ?CREATININE 0.56 0.42*  ?CALCIUM 10.8* 9.3  ? ? ?Lab Results  ?Component Value Date  ? CEA1 1.40 12/16/2020  ? CEA 1.57 02/16/2021  ? ? ?Studies/Results: ?Portable chest 1 View ? ?Result Date: 08/04/2021 ?CLINICAL DATA:  Leukocytosis. History of atrial fibrillation, breast cancer, diabetes and smoking. EXAM: PORTABLE CHEST 1 VIEW COMPARISON:  Chest radiograph 05/08/2020, CT 03/20/2020 FINDINGS: Left chest port with tip overlying the SVC. The lungs are hyperinflated with interstitial coarsening. Multiple skin folds project over the left lower lung zone. No pneumothorax. No focal airspace consolidation. No pulmonary edema or pleural effusion. Surgical clips project over the right chest wall and breast. The bones are under mineralized IMPRESSION: 1. No acute abnormality. 2. Hyperinflation and interstitial coarsening consistent with  smoking history. Electronically Signed   By: Keith Rake M.D.   On: 08/04/2021 18:07   ? ?Medications: I have reviewed the patient's current medications. ? ?Assessment/Plan: ? 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, stage IIIc pT3pN2b ?Moderately differentiated adenocarcinoma, 9/30 lymph nodes positive, lymphovascular invasion present including large vessel extramural invasion, loss of MLH1 and PMS2, MLH1 hyper methylation present ?Incomplete colonoscopy secondary to stricturing at the sigmoid colon ?Virtual colonoscopy 03/13/2020- mass centered at the medial wall of the cecum and terminal ileum, adjacent ileocolic mesenteric adenopathy, nonspecific 3 mm right lower lobe nodule ?PET 16/01/9603 hypermetabolic cecal/terminal ileum mass with adjacent enlarged and hypermetabolic ileocolic lymph nodes, previously noted segment 7 liver lesion-not hypermetabolic- benign etiology favored, dominant left lower lobe pulmonary nodule not hypermetabolic, other tiny nodules not hypermetabolic and below PET sensitivity ?Cycle 1 FOLFOX 05/12/2020 ?Cycle 2 FOLFOX 05/26/2020 ?Cycle 3 FOLFOX 06/09/2020 ?Cycle 4 FOLFOX 06/23/2020 ?Cycle 5 FOLFOX 07/07/2020 ?Cycle 12 FOLFOX 10/14/2020, oxaliplatin dose reduced beginning with cycle 9 ?CT 12/04/2020-development of peripheral enhancing hypoattenuating mass in segment 5/8, necrotic porta hepatis lymphadenopathy, similar appearance of previously visualized nonhypermetabolic subcapsular segment 7 lesion ?Ultrasound-guided biopsy right liver lesion 12/22/2020-adenocarcinoma consistent with a colon primary ?PET scan 12/30/2020-hypermetabolic hepatic lesion and portal adenopathy-increased in size compared to 12/04/2020 CT, small hypermetabolic left periaortic node ?Foundation 1-MSI high, tumor mutation burden 31, K-ras wild-type ?Cycle 1 Pembrolizumab 01/15/2021 ?Cycle 1 FOLFIRI 02/16/2021 ?Cycle 2 FOLFIRI 03/02/2021-Emend and Udenyca added ?Cycle 3 FOLFIRI 03/17/2021 ?Cycle 4  FOLFIRI 03/31/2021 ?Cycle 5 FOLFIRI 04/15/2021 ?CT abdomen/pelvis 04/23/2021-mild decrease in size  of right hepatic lobe metastases, mild decrease in porta hepatis and portacaval lymphadenopathy, mild increase in left periaortic lymphadenopathy ?Cycle 6 FOLFIRI 04/28/2021 ?Cycle 7 FOLFIRI 05/12/2021 ?Cycle 8 FOLFIRI 05/26/2021 ?Cycle 9 FOLFIRI 06/09/2021 ?Cycle 10 FOLFIRI 06/30/2021 ?CTs 07/10/2021-decrease in size of right hepatic lobe metastatic lesion and periportal and retroperitoneal nodal disease.  No new or progressive findings. ?Cycle 11 FOLFIRI 07/14/2021 ?Iron deficiency anemia secondary to #1 ?Right breast cancer, stage Ia, T1b, N0, ER positive, PR positive, HER2 positive status post a right lumpectomy and adjuvant radiation/Taxol/Herceptin, anastrozole starting 01/25/2019 ?Diabetes ?Atrial fibrillation-maintained on Coumadin, Coumadin dose adjusted 03/02/2021 ?Hypothyroidism ?Ongoing tobacco use ?Family history of breast cancer-negative genetic testing ?Oxaliplatin neuropathy ?Intermittent upper abdominal discomfort-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Admission 08/04/2021 with abdomen/back pain and failure to thrive ? ?Ms. Kegg was admitted yesterday with increased abdominal/back pain, anorexia, and constipation.  She has a history of metastatic colon cancer with abdominal/retroperitoneal adenopathy.  I suspect her pain is related to the metastatic tumor burden, but the restaging CTs 07/10/2021 showed no evidence of disease progression.  Today's labs are consistent with improvement in hydration. ? ?I recommend she continue a bowel regimen today.  If the pain does not improve I recommend a repeat CT evaluation.  She will be a candidate for treatment with pembrolizumab or an EGFR inhibitor if there is disease progression. ? ? ?Recommendations: ?Continue narcotic analgesics for pain ?Bowel regimen ?CT abdomen/pelvis 08/06/2021 if she has persistent pain ?Coumadin per pharmacy ?Outpatient biopsy of right  breast mass ? LOS: 1 day  ? ?Betsy Coder, MD  ? ?08/05/2021, 6:27 AM ?  ?

## 2021-08-05 NOTE — Progress Notes (Signed)
?  Transition of Care (TOC) Screening Note ? ? ?Patient Details  ?Name: Sabrina [name] ?Date of Birth: 09/12/1950 ? ? ?Transition of Care (TOC) CM/SW Contact:    ?Wilburn Keir, Marjie Skiff, RN ?Phone Number: ?08/05/2021, 2:26 PM ? ? ? ?Transition of Care Department [facility]) has reviewed patient and no TOC needs have been identified at this time. We will continue to monitor patient advancement through interdisciplinary progression rounds. If new patient transition needs arise, please place a TOC consult. ?  ?

## 2021-08-05 NOTE — Progress Notes (Signed)
?PROGRESS NOTE ?Sabrina [name]  GEX:[id number] DOB: 04-06-1951 DOA: 08/04/2021 ?PCP: [name], NP  ? ?Brief Narrative/Hospital Course: ?71 year old female colon cancer, history of right colectomy 04/13/2020, metastasis to right liver and periportal lymph retroperitoneal nodal disease-decreasing tumor size on last CT on 07/10/2021,on active treatment with FOLFIRI, last treated 07/15/2314,  stable portal vein thrombosis, PAF on Coumadin, T2DM, iron-deficiency anemia, history of right breast cancer status postlumpectomy and adjuvant radiation and chemo, hypothyroidism, ongoing, diabetes, oxaliplatin neuropathy had marked decline in past week  with deep achy pain in upper abdomen, constipation x 1 wk, generalized weakness, anorexia and low back pain. Patient endorses she has lost more than 12 lb In 3 wk, has had anorexia, low back pain.  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 ?She denies any fever, chills, nausea, vomiting, headache, chest pain, focal weakness.   ?She was seen at the oncology center or a follow-up appointment preceding with chemotherapy found to have severe dehydration and sent to the hospital for admission and symptom management and suspected to have progressive cancer, also noted to have palpable right breast mass on exam.  ?AT cancer center routine blood work showed stable renal function with mildly increased anion gap, leukocytosis, INR 3.3. ?  ?  ?Subjective: ?Seen and examined this morning still had no bowel movement but not eating much.Vomited yesterday after MiraLAX.Overall feels improving, daughter at the bedside.No fever no chest pain no nausea ?Overnight afebrile, WBC downtrending ? ?Assessment and Plan: ?Principal Problem: ?  Intractable pain ?Active Problems: ?  Paroxysmal atrial fibrillation ([place]) ?  Long term (current) use of anticoagulants [Z79.01] ?  Benign essential hypertension ?  Hypothyroidism due to Hashimoto's thyroiditis ?  Type 2 diabetes mellitus  without complication, without long-term current use of insulin ([place]) ?  Iron deficiency anemia due to chronic blood loss ?  Metastatic colon cancer to liver [facility]) ?  Dehydration ?  Failure to thrive in adult ?  Anorexia ?  Leukocytosis ?  Pain in the abdomen ?  Protein-calorie malnutrition, severe ([place]) ?  Breast mass, right ?  ?Intractable pain in abdomen,back pain ?Anorexia ?Weight loss ?Failure to thrive ?Metastatic colon cancer ?Severe protein calorie malnutrition: ?multifactorial in the setting of dehydration, possible progression of her metastatic disease.appreciate oncology input continue on supportive measurement antiemetics IV fluid hydration , PPI IV morphine.  Get x-ray abdomen today, planning for CT scan tomorrow per oncology.  Augment nutritional status.  Changed to full liquid diet. ?Constipation x1 week:Add laxatives.Hydrate and plan for imaging in the morning. ?  ?Leukocytosis: Likely from hemoconcentration/reactive.  Improving on hydration.  No evidence of infection.   ? ?Right breast mass appears to be new-oncology planning for outpatient biopsy. Cont her anastrozole. ?  ?Paroxysmal atrial fibrillation: Rate controlled, pharmacy dosing Coumadin, therapeutic,  cont flecainide, does not take digoxin and metoprolol anymore. ?  ?Benign essential hypertension: BP stable. ?  ?Hypothyroidism:  Synthroid ?  ?Type 2 diabetes mellitus without complication, without long-term current use of insulin: Blood sugar stable, continue to hold metformi, SSI as below ?Recent Labs  ?Lab 08/04/21 ?2039 08/05/21 ?0454 08/05/21 ?0102  ?GLUCAP 125*  --  93  ?HGBA1C  --  5.8*  --   ?   ?Iron deficiency anemia due to chronic blood loss: Hemoglobin stable.  Monitor ?Recent Labs  ?Lab 08/04/21 ?1015 08/05/21 ?0454  ?HGB 16.0* 13.9  ?HCT 50.8* 43.2  ?  ?Goals of care discussed with the patient and daughter at the bedside, she  wishes for DNR. ? ?DVT prophylaxis: coumadin ?Code Status:   Code Status: DNR ?Family Communication:  plan of care discussed with patient at bedside. ?Patient status is: inpatient level of care for ongoing pain management, IV fluid hydration: Med-Surg  ?Patient currently not stable ? ?Dispo: The patient is from: home ?           Anticipated disposition: home ? ?Mobility Assessment (last 72 hours)   ? ? Mobility Assessment   ? ? [place] Name 08/05/21 0830 08/04/21 1730  ?  ?  ?  ? Does patient have an order for bedrest or is patient medically unstable No - Continue assessment No - Continue assessment     ? What is the highest level of mobility based on the progressive mobility assessment? Level 6 (Walks independently in room and hall) - Balance while walking in room without assist - Complete Level 5 (Walks with assist in room/hall) - Balance while stepping forward/back and can walk in room with assist - Complete     ? ?  ?  ? ?  ?  ? ?Objective: ?Vitals last 24 hrs: ?Vitals:  ? 08/04/21 2037 08/04/21 2115 08/05/21 0531 08/05/21 1000  ?BP: (!) 174/93  (!) 170/90   ?Pulse: 90  71   ?Resp: 18  18   ?Temp: 98.5 ?F (36.9 ?C)  98 ?F (36.7 ?C)   ?TempSrc: Oral  Oral   ?SpO2: 91%  92%   ?Weight:  48.8 kg  50.5 kg  ?Height:  '5\' 10"'$  (1.778 m)    ? ?Weight change:  ? ?Physical Examination: ?General exam: AA0X3,older than stated age, weak appearing. ?HEENT:Oral mucosa moist, Ear/Nose WNL grossly, dentition normal. ?Respiratory system: bilaterally  CLEAR BS, no use of accessory muscle ?Cardiovascular system: S1 & S2 +, No JVD,. ?Gastrointestinal system: Abdomen soft,NT,ND, BS+ ?Nervous System:Alert, awake, moving extremities and grossly nonfocal ?Extremities: LE edema none,distal peripheral pulses palpable.  ?Skin: No rashes,no icterus. ?MSK: Normal muscle bulk,tone, power ? ?Medications reviewed:  ?Scheduled Meds: ? anastrozole  1 mg Oral Daily  ? Chlorhexidine Gluconate Cloth  6 each Topical Daily  ? flecainide  100 mg Oral BID  ? insulin aspart  0-9 Units Subcutaneous TID WC  ? levothyroxine  100 mcg Oral Q0600  ? mouth rinse  15  mL Mouth Rinse BID  ? pantoprazole  40 mg Oral BID  ? senna-docusate  1 tablet Oral BID  ? sorbitol  15 mL Oral BID  ? ?Continuous Infusions: ? lactated ringers 125 mL/hr at 08/05/21 1022  ? ? ?  ?Diet Order   ? ?       ?  Diet clear liquid Room service appropriate? Yes; Fluid consistency: Thin  Diet effective now       ?  ? ?  ?  ? ?  ?  ? ?  ?  ?  ? ? ?Intake/Output Summary (Last 24 hours) at 08/05/2021 1130 ?Last data filed at 08/05/2021 0600 ?Gross per 24 hour  ?Intake 1700.19 ml  ?Output --  ?Net 1700.19 ml  ? ?Net IO Since Admission: 1,700.19 mL [08/05/21 1130]  ?Wt Readings from Last 3 Encounters:  ?08/05/21 50.5 kg  ?08/04/21 52.4 kg  ?07/14/21 57.6 kg  ?  ? ?Unresulted Labs (From admission, onward)  ? ?  Start     Ordered  ? 08/05/21 0500  Protime-INR  Daily,   R     ? 08/04/21 1719  ? 08/04/21 2134  Urine Culture  Add-on,  AD       ?Question:  Indication  Answer:  Dysuria  ? 08-25-21 2134  ? ?  ?  ? ?  ?Data Reviewed: I have personally reviewed following labs and imaging studies ?CBC: ?Recent Labs  ?Lab Aug 25, 2021 ?1015 08/05/21 ?0454  ?WBC 18.7* 11.2*  ?NEUTROABS 17.2*  --   ?HGB 16.0* 13.9  ?HCT 50.8* 43.2  ?MCV 89.0 89.3  ?PLT 208 158  ? ?Basic Metabolic Panel: ?Recent Labs  ?Lab 08-25-21 ?1015 08/05/21 ?0454  ?NA 137 138  ?K 3.8 3.5  ?CL 92* 95*  ?CO2 29 33*  ?GLUCOSE 145* 101*  ?BUN 25* 23  ?CREATININE 0.56 0.42*  ?CALCIUM 10.8* 9.3  ? ?GFR: ?Estimated Creatinine Clearance: 52.2 mL/min (A) (by C-G formula based on SCr of 0.42 mg/dL (L)). ?Liver Function Tests: ?Recent Labs  ?Lab 25-Aug-2021 ?1015 08/05/21 ?0454  ?AST 22 24  ?ALT 12 14  ?ALKPHOS 149* 128*  ?BILITOT 0.6 1.1  ?PROT 7.7 6.7  ?ALBUMIN 4.6 3.9  ? ?No results for input(s): LIPASE, AMYLASE in the last 168 hours. ?No results for input(s): AMMONIA in the last 168 hours. ?Coagulation Profile: ?Recent Labs  ?Lab 08-25-2021 ?1015 08/05/21 ?0454  ?INR 3.3* 3.1*  ? ?BNP (last 3 results) ?No results for input(s): PROBNP in the last 8760  hours. ?HbA1C: ?Recent Labs  ?  08/05/21 ?0454  ?HGBA1C 5.8*  ? ?CBG: ?Recent Labs  ?Lab 25-Aug-2021 ?2039 08/05/21 ?1610  ?GLUCAP 125* 93  ? ?Lipid Profile: ?No results for input(s): CHOL, HDL, LDLCALC, TRIG, CHOLHDL, LDLDIRECT in t

## 2021-08-05 NOTE — Progress Notes (Signed)
ANTICOAGULATION CONSULT NOTE  ? ?Pharmacy Consult for warfarin ?Indication: hx atrial fibrillation ? ?Allergies  ?Allergen Reactions  ? Atorvastatin Other (See Comments)  ?  Muscle aches  ? Meperidine Nausea And Vomiting  ? ? ?Patient Measurements: ?Height: '5\' 10"'$  (177.8 cm) ?Weight: 48.8 kg (107 lb 9.6 oz) ?IBW/kg (Calculated) : 68.5 ?Heparin Dosing Weight:  ? ?Vital Signs: ?Temp: 98 ?F (36.7 ?C) (04/26 0531) ?Temp Source: Oral (04/26 0531) ?BP: 170/90 (04/26 0531) ?Pulse Rate: 71 (04/26 0531) ? ?Labs: ?Recent Labs  ?  08/04/21 ?1015 08/05/21 ?0454  ?HGB 16.0* 13.9  ?HCT 50.8* 43.2  ?PLT 208 158  ?LABPROT 33.0* 31.6*  ?INR 3.3* 3.1*  ?CREATININE 0.56 0.42*  ? ? ? ?Estimated Creatinine Clearance: 50.4 mL/min (A) (by C-G formula based on SCr of 0.42 mg/dL (L)). ? ? ?Medications:  ?- PTA warfarin regimen: per pt, 10 mg daily except '5mg'$  on Mon, Wed and Sat (Last dose taken on 08/03/21) ? ?Assessment: ?Patient is a 71 y.o F with metastatic colon cancer  on chemotheraoy and hx afib on warfarin PTA who was admitted to [facility] on 08/04/21 for evaluation of abdominal pain.  Pharmacy has been consulted to resume warfarin on admission. ? ?Today, 08/05/2021: ?- INR is supra-therapeutic at 3.1. Pt reported poor oral intake so this could make her more sensitive to warfarin. ?- cbc ok ?- no bleeding documented ? ?Goal of Therapy:  ?INR 2-3 ?Monitor platelets by anticoagulation protocol: Yes ?  ?Plan:  ?- will hold dose today due to supra-therapeutic INR ?- f/u daily INR resume when appropriate ? ?Peggyann Juba, PharmD, BCPS ?Pharmacy: [phone] ?08/05/2021,6:59 AM ? ? ?

## 2021-08-05 NOTE — Progress Notes (Signed)
Chaplain engaged in an initial visit with Sabrina [name] and her daughter.  Chaplain provided education on [employer], Healthcare POA and [place].  Chaplain was able to have a meaningful conversation with [name] around her wants and desires for her health.  [name] expressed how quality of life is important to her.  She also doesn't desire to be a burden to her children long term.  She wants to have all interventions done now to get her back to a place of having some quality of life and independence.   ? ?Chaplain offered reflective listening, a compassionate presence, education, and affirmation of her needs concerning getting this AD completed.  Chaplain stressed to make sure she continues to have these conversations with her son and daughter.  She wants her children to be her healthcare agents.  Chaplain also expressed that because they are "next of kin" they already have decision making power if she comes to a place of being unable to speak for herself.   ? ? ? 08/05/21 0900  ?Clinical Encounter Type  ?Visited With Patient and family together  ?Visit Type Initial;Social support  ?Referral From Family  ?Consult/Referral To Chaplain  ?Spiritual Encounters  ?Spiritual Needs Literature;Brochure  ?Stress Factors  ?Patient Stress Factors Major life changes;Health changes  ? ? ?

## 2021-08-06 ENCOUNTER — Inpatient Hospital Stay (HOSPITAL_COMMUNITY): Payer: Medicare Other

## 2021-08-06 ENCOUNTER — Inpatient Hospital Stay: Payer: Medicare Other

## 2021-08-06 ENCOUNTER — Encounter (HOSPITAL_COMMUNITY): Payer: Self-pay | Admitting: Internal Medicine

## 2021-08-06 DIAGNOSIS — R52 Pain, unspecified: Secondary | ICD-10-CM | POA: Diagnosis not present

## 2021-08-06 LAB — URINE CULTURE: Culture: <10000 — AB

## 2021-08-06 LAB — GLUCOSE, CAPILLARY
Glucose-Capillary: 93 mg/dL (ref 70–99)
Glucose-Capillary: 108 mg/dL — ABNORMAL HIGH (ref 70–99)
Glucose-Capillary: 131 mg/dL — ABNORMAL HIGH (ref 70–99)
Glucose-Capillary: 94 mg/dL (ref 70–99)

## 2021-08-06 LAB — PROTIME-INR
INR: 2.8 — ABNORMAL HIGH (ref 0.8–1.2)
Prothrombin Time: 29.2 seconds — ABNORMAL HIGH (ref 11.4–15.2)

## 2021-08-06 MED ORDER — IOHEXOL 9 MG/ML PO SOLN
500.0000 mL | ORAL | Status: AC
  Administered 2021-08-06 (×2): 500 mL via ORAL

## 2021-08-06 MED ORDER — WARFARIN SODIUM 5 MG PO TABS
5.0000 mg | ORAL_TABLET | Freq: Once | ORAL | Status: AC
  Administered 2021-08-06: 5 mg via ORAL
  Filled 2021-08-06: qty 1

## 2021-08-06 MED ORDER — IOHEXOL 300 MG/ML  SOLN
100.0000 mL | Freq: Once | INTRAMUSCULAR | Status: AC | PRN
  Administered 2021-08-06: 100 mL via INTRAVENOUS

## 2021-08-06 MED ORDER — SODIUM CHLORIDE (PF) 0.9 % IJ SOLN
INTRAMUSCULAR | Status: AC
  Filled 2021-08-06: qty 50

## 2021-08-06 MED ORDER — IOHEXOL 9 MG/ML PO SOLN
ORAL | Status: AC
  Filled 2021-08-06: qty 1000

## 2021-08-06 MED ORDER — WARFARIN - PHARMACIST DOSING INPATIENT: Freq: Every day | Status: DC

## 2021-08-06 NOTE — Progress Notes (Signed)
?PROGRESS NOTE ?Sabrina [name]  WUJ:[id number] DOB: December 06, 1950 DOA: 08/04/2021 ?PCP: [name], NP  ? ?Brief Narrative/Hospital Course: ?71 year old female colon cancer, history of right colectomy 04/13/2020, metastasis to right liver and periportal lymph retroperitoneal nodal disease-decreasing tumor size on last CT on 07/10/2021,on active treatment with FOLFIRI, last treated 07/15/2314,  stable portal vein thrombosis, PAF on Coumadin, T2DM, iron-deficiency anemia, history of right breast cancer status postlumpectomy and adjuvant radiation and chemo, hypothyroidism, ongoing, diabetes, oxaliplatin neuropathy had marked decline in past week  with deep achy pain in upper abdomen, constipation x 1 wk, generalized weakness, anorexia and low back pain. Patient endorses she has lost more than 12 lb In 3 wk, has had anorexia, low back pain.  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 ?She denies any fever, chills, nausea, vomiting, headache, chest pain, focal weakness.   ?She was seen at the oncology center or a follow-up appointment preceding with chemotherapy found to have severe dehydration and sent to the hospital for admission and symptom management and suspected to have progressive cancer, also noted to have palpable right breast mass on exam.  ?AT cancer center routine blood work showed stable renal function with mildly increased anion gap, leukocytosis, INR 3.3. ?  ?Subjective: ?Alert,awake ?Resting ?+Overnight patient has been afebrile ?No BM yet ?Drinking oral contrast for CT scan ? ?Assessment and Plan: ?Principal Problem: ?  Intractable pain ?Active Problems: ?  Paroxysmal atrial fibrillation ([place]) ?  Long term (current) use of anticoagulants [Z79.01] ?  Benign essential hypertension ?  Hypothyroidism due to Hashimoto's thyroiditis ?  Type 2 diabetes mellitus without complication, without long-term current use of insulin ([place]) ?  Iron deficiency anemia due to chronic blood loss ?  Metastatic  colon cancer to liver [facility]) ?  Dehydration ?  Failure to thrive in adult ?  Anorexia ?  Leukocytosis ?  Pain in the abdomen ?  Protein-calorie malnutrition, severe ([place]) ?  Breast mass, right ?  ?Intractable pain in abdomen,back pain ?Anorexia ?Weight loss ?Failure to thrive ?Metastatic colon cancer ?Severe protein calorie malnutrition: ?multifactorial in the setting of dehydration, possible progression of her metastatic disease. On antiemetics IVF,PPI IV morphine.  X-ray with some stool, CT scan planned this morning per oncology continue supportive care additional documentation  ? ?Constipation x1 week: Pending CT scan, continue laxatives as needed.  Has not been eating much  ?  ?Leukocytosis: Likely from hemoconcentration/reactive.  Downtrending, continue hydration  ? ?Right breast mass appears to be new-oncology planning for outpatient biopsy. Cont her anastrozole. ?  ?Paroxysmal atrial fibrillation: Rate controlled, pharmacy dosing Coumadin, therapeutic,  cont flecainide. No longer on digoxin and metoprolol. ?  ?Benign essential hypertension: BP borderline controlled, not on meds. ?  ?Hypothyroidism:  cont synthroid ?  ?N5AO W/ complication, without long-term current use of insulin: Blood sugar stable, continue to hold metformi, cont SSI as below ?Recent Labs  ?Lab 08/05/21 ?1308 08/05/21 ?6578 08/05/21 ?1153 08/05/21 ?1635 08/05/21 ?2130 08/06/21 ?4696  ?GLUCAP  --  93 102* 109* 101* 93  ?HGBA1C 5.8*  --   --   --   --   --   ? ?   ?Iron deficiency anemia due to chronic blood loss: stable hb hemoglobin had initial polycythemia due to hemoconcentration ?Recent Labs  ?Lab 08/04/21 ?1015 08/05/21 ?0454  ?HGB 16.0* 13.9  ?HCT 50.8* 43.2  ? ?  ?Goals of care discussed with the patient and daughter at the bedside, she wishes for DNR. ? ?  DVT prophylaxis: coumadin ?Code Status:   Code Status: DNR ?Family Communication: plan of care discussed with patient at bedside. ?Patient status is: inpatient level of care for  ongoing pain management, IV fluid hydration: Med-Surg  ?Patient currently not stable ? ?Dispo: The patient is from: home ?           Anticipated disposition: home ? ?Mobility Assessment (last 72 hours)   ? ? Mobility Assessment   ? ? [place] Name 08/05/21 0830 08/04/21 1730  ?  ?  ?  ? Does patient have an order for bedrest or is patient medically unstable No - Continue assessment No - Continue assessment     ? What is the highest level of mobility based on the progressive mobility assessment? Level 6 (Walks independently in room and hall) - Balance while walking in room without assist - Complete Level 5 (Walks with assist in room/hall) - Balance while stepping forward/back and can walk in room with assist - Complete     ? ?  ?  ? ?  ?  ? ?Objective: ?Vitals last 24 hrs: ?Vitals:  ? 08/05/21 1000 08/05/21 1409 08/05/21 1944 08/06/21 0439  ?BP:  (!) 176/85 (!) 162/88 (!) 169/92  ?Pulse:  (!) 59 64 64  ?Resp:  '16 18 18  '$ ?Temp:  98.6 ?F (37 ?C) (!) 97.5 ?F (36.4 ?C) 98 ?F (36.7 ?C)  ?TempSrc:  Oral Oral Oral  ?SpO2:  95% 94% 91%  ?Weight: 50.5 kg     ?Height:      ? ?Weight change: 1.724 kg ? ?Physical Examination: ?General exam: Aa0x3, thin,older than stated age, weak appearing. ?HEENT:Oral mucosa moist, Ear/Nose WNL grossly, dentition normal. ?Respiratory system: bilaterally diminished,no use of accessory muscle ?Cardiovascular system: S1 & S2 +, No JVD,. ?Gastrointestinal system: Abdomen soft,NT,ND, BS+ ?Nervous System:Alert, awake, moving extremities and grossly nonfocal ?Extremities: edema neg,distal peripheral pulses palpable.  ?Skin: No rashes,no icterus. ?MSK: thin muscle bulk,tone, power ? ? ?Medications reviewed:  ?Scheduled Meds: ? (feeding supplement) PROSource Plus  30 mL Oral BID BM  ? anastrozole  1 mg Oral Daily  ? Chlorhexidine Gluconate Cloth  6 each Topical Daily  ? feeding supplement  1 Container Oral TID BM  ? flecainide  100 mg Oral BID  ? insulin aspart  0-9 Units Subcutaneous TID WC  ? levothyroxine   100 mcg Oral Q0600  ? mouth rinse  15 mL Mouth Rinse BID  ? multivitamin with minerals  1 tablet Oral Daily  ? pantoprazole  40 mg Oral BID  ? Ensure Max Protein  11 oz Oral BID  ? senna-docusate  1 tablet Oral BID  ? sorbitol  15 mL Oral BID  ? warfarin  5 mg Oral ONCE-1600  ? Warfarin - Pharmacist Dosing Inpatient   Does not apply [id number]  ? ?Continuous Infusions: ? lactated ringers 125 mL/hr at 08/06/21 5597  ? ? ?  ?Diet Order   ? ?       ?  Diet full liquid Room service appropriate? Yes; Fluid consistency: Thin  Diet effective now       ?  ? ?  ?  ? ?  ?  ? ?Nutrition Problem: Severe Malnutrition ?Etiology: chronic illness, cancer and cancer related treatments ?Signs/Symptoms: severe fat depletion, severe muscle depletion, percent weight loss ?Interventions: Premier Protein, Boost Breeze, MVI, Prostat ? ? ?Intake/Output Summary (Last 24 hours) at 08/06/2021 0848 ?Last data filed at 08/06/2021 0730 ?Gross per 24 hour  ?Intake 2653.82 ml  ?  Output 2250 ml  ?Net 403.82 ml  ? ? ?Net IO Since Admission: 2,104.01 mL [08/06/21 0848]  ?Wt Readings from Last 3 Encounters:  ?08/05/21 50.5 kg  ?08-28-2021 52.4 kg  ?07/14/21 57.6 kg  ?  ? ?Unresulted Labs (From admission, onward)  ? ?  Start     Ordered  ? 08/05/21 0500  Protime-INR  Daily,   R     ? 28-Aug-2021 1719  ? Aug 28, 2021 2134  Urine Culture  Add-on,   AD       ?Question:  Indication  Answer:  Dysuria  ? 2021-08-28 2134  ? ?  ?  ? ?  ?Data Reviewed: I have personally reviewed following labs and imaging studies ?CBC: ?Recent Labs  ?Lab 08-28-2021 ?1015 08/05/21 ?0454  ?WBC 18.7* 11.2*  ?NEUTROABS 17.2*  --   ?HGB 16.0* 13.9  ?HCT 50.8* 43.2  ?MCV 89.0 89.3  ?PLT 208 158  ? ? ?Basic Metabolic Panel: ?Recent Labs  ?Lab 28-Aug-2021 ?1015 08/05/21 ?0454  ?NA 137 138  ?K 3.8 3.5  ?CL 92* 95*  ?CO2 29 33*  ?GLUCOSE 145* 101*  ?BUN 25* 23  ?CREATININE 0.56 0.42*  ?CALCIUM 10.8* 9.3  ? ? ?GFR: ?Estimated Creatinine Clearance: 52.2 mL/min (A) (by C-G formula based on SCr of 0.42 mg/dL  (L)). ?Liver Function Tests: ?Recent Labs  ?Lab 2021/08/28 ?1015 08/05/21 ?0454  ?AST 22 24  ?ALT 12 14  ?ALKPHOS 149* 128*  ?BILITOT 0.6 1.1  ?PROT 7.7 6.7  ?ALBUMIN 4.6 3.9  ? ? ?No results for input(s): LIPAS

## 2021-08-06 NOTE — Progress Notes (Signed)
ANTICOAGULATION CONSULT NOTE  ? ?Pharmacy Consult for warfarin ?Indication: hx atrial fibrillation ? ?Allergies  ?Allergen Reactions  ? Atorvastatin Other (See Comments)  ?  Muscle aches  ? Meperidine Nausea And Vomiting  ? ? ?Patient Measurements: ?Height: '5\' 10"'$  (177.8 cm) ?Weight: 50.5 kg (111 lb 6.4 oz) ?IBW/kg (Calculated) : 68.5 ?Heparin Dosing Weight:  ? ?Vital Signs: ?Temp: 98 ?F (36.7 ?C) (04/27 0439) ?Temp Source: Oral (04/27 0439) ?BP: 169/92 (04/27 0439) ?Pulse Rate: 64 (04/27 0439) ? ?Labs: ?Recent Labs  ?  08/04/21 ?1015 08/05/21 ?0454 08/06/21 ?0527  ?HGB 16.0* 13.9  --   ?HCT 50.8* 43.2  --   ?PLT 208 158  --   ?LABPROT 33.0* 31.6* 29.2*  ?INR 3.3* 3.1* 2.8*  ?CREATININE 0.56 0.42*  --   ? ? ? ?Estimated Creatinine Clearance: 52.2 mL/min (A) (by C-G formula based on SCr of 0.42 mg/dL (L)). ? ? ?Medications:  ?- PTA warfarin regimen: per pt, 10 mg daily except '5mg'$  on Mon, Wed and Sat (Last dose taken on 08/03/21) ? ?Assessment: ?Patient is a 71 y.o F with metastatic colon cancer  on chemotheraoy and hx afib on warfarin PTA who was admitted to [facility] on 08/04/21 for evaluation of abdominal pain.  Pharmacy has been consulted to resume warfarin on admission. ? ?Today, 08/06/2021: ?- INR 2.8, now decreased into therapeutic range. ?- cbc ok yesterday ?- no bleeding documented ?- PO intake has been low, starting nutritional supplements ? ?Goal of Therapy:  ?INR 2-3 ?Monitor platelets by anticoagulation protocol: Yes ?  ?Plan:  ?- Resume lower than her usual dose of warfarin today ?- fDaily PT/INR ? ?Peggyann Juba, PharmD, BCPS ?Pharmacy: [phone] ?08/06/2021,7:08 AM ? ? ?

## 2021-08-06 NOTE — Progress Notes (Signed)
Chaplain completed Advanced Directive notarization and provided four copies to [employer].  Chaplain input one in her medical chart. She has designated her daughter, Estill Bamberg, and son, Jeneen Rinks, as her healthcare agents. ? ? ? 08/06/21 1132  ?Clinical Encounter Type  ?Visited With Patient  ?Visit Type Follow-up;Social support  ?Advance Directives (For Healthcare)  ?Does Patient Have a Medical Advance Directive? Yes  ?Type of Paramedic of [place];Living will  ? ? ?

## 2021-08-06 NOTE — Progress Notes (Addendum)
IP PROGRESS NOTE ? ?Subjective:  ? ?[name] continues to have abdominal pain.  She also has some back pain.  She has ongoing constipation.  Continues to have nausea but has not vomited in the past 24 hours. ? ?Objective: ?Vital signs in last 24 hours: ?Blood pressure (!) 169/92, pulse 64, temperature 98 ?F (36.7 ?C), temperature source Oral, resp. rate 18, height 5' 10" (1.778 m), weight 50.5 kg, SpO2 91 %. ? ?Intake/Output from previous day: ?04/26 0701 - 04/27 0700 ?In: 2403.8 [I.V.:2403.8] ?Out: 2250 [Urine:2250] ? ?Physical Exam: ? ?HEENT: No thrush ?Cardiac: Regular rate and rhythm ?Abdomen: Hypoactive/tinkling bowel sounds, mild tenderness to palpation, no masses, no hepatosplenomegaly ?Extremities: No leg edema ?Breast: 2-3 cm mass at the upper outer right breast ? ?Portacath/PICC-without erythema ? ?Lab Results: ?Recent Labs  ?  08/04/21 ?1015 08/05/21 ?0454  ?WBC 18.7* 11.2*  ?HGB 16.0* 13.9  ?HCT 50.8* 43.2  ?PLT 208 158  ? ? ?BMET ?Recent Labs  ?  08/04/21 ?1015 08/05/21 ?0454  ?NA 137 138  ?K 3.8 3.5  ?CL 92* 95*  ?CO2 29 33*  ?GLUCOSE 145* 101*  ?BUN 25* 23  ?CREATININE 0.56 0.42*  ?CALCIUM 10.8* 9.3  ? ? ?Lab Results  ?Component Value Date  ? CEA1 1.40 12/16/2020  ? CEA 1.57 02/16/2021  ? ? ?Studies/Results: ?Portable chest 1 View ? ?Result Date: 08/04/2021 ?CLINICAL DATA:  Leukocytosis. History of atrial fibrillation, breast cancer, diabetes and smoking. EXAM: PORTABLE CHEST 1 VIEW COMPARISON:  Chest radiograph 05/08/2020, CT 03/20/2020 FINDINGS: Left chest port with tip overlying the SVC. The lungs are hyperinflated with interstitial coarsening. Multiple skin folds project over the left lower lung zone. No pneumothorax. No focal airspace consolidation. No pulmonary edema or pleural effusion. Surgical clips project over the right chest wall and breast. The bones are under mineralized IMPRESSION: 1. No acute abnormality. 2. Hyperinflation and interstitial coarsening consistent with smoking history.  Electronically Signed   By: Keith Rake M.D.   On: 08/04/2021 18:07  ? ?DG Abd Portable 1V ? ?Result Date: 08/05/2021 ?CLINICAL DATA:  Constipation EXAM: PORTABLE ABDOMEN - 1 VIEW COMPARISON:  Abdominal radiograph done on 01/19/2021 and CT done on 07/10/2021 FINDINGS: Bowel gas pattern is nonspecific. Small to moderate amount of stool is seen in the colon. There is no fecal impaction in the rectosigmoid. Surgical clips are seen in the right upper quadrant. Levoscoliosis is seen in the lumbar spine. Degenerative changes are noted with bony spurs in the lumbar spine. IMPRESSION: Nonspecific bowel gas pattern. Small to moderate stool burden in the colon without signs of fecal impaction in the rectum. Electronically Signed   By: Elmer Picker M.D.   On: 08/05/2021 12:50   ? ?Medications: I have reviewed the patient's current medications. ? ?Assessment/Plan: ? 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, stage IIIc pT3pN2b ?Moderately differentiated adenocarcinoma, 9/30 lymph nodes positive, lymphovascular invasion present including large vessel extramural invasion, loss of MLH1 and PMS2, MLH1 hyper methylation present ?Incomplete colonoscopy secondary to stricturing at the sigmoid colon ?Virtual colonoscopy 03/13/2020- mass centered at the medial wall of the cecum and terminal ileum, adjacent ileocolic mesenteric adenopathy, nonspecific 3 mm right lower lobe nodule ?PET 82/50/0370 hypermetabolic cecal/terminal ileum mass with adjacent enlarged and hypermetabolic ileocolic lymph nodes, previously noted segment 7 liver lesion-not hypermetabolic- benign etiology favored, dominant left lower lobe pulmonary nodule not hypermetabolic, other tiny nodules not hypermetabolic and below PET sensitivity ?Cycle 1 FOLFOX 05/12/2020 ?Cycle 2 FOLFOX 05/26/2020 ?Cycle 3 FOLFOX  06/09/2020 ?Cycle 4 FOLFOX 06/23/2020 ?Cycle 5 FOLFOX 07/07/2020 ?Cycle 12 FOLFOX 10/14/2020, oxaliplatin dose reduced beginning with cycle  9 ?CT 12/04/2020-development of peripheral enhancing hypoattenuating mass in segment 5/8, necrotic porta hepatis lymphadenopathy, similar appearance of previously visualized nonhypermetabolic subcapsular segment 7 lesion ?Ultrasound-guided biopsy right liver lesion 12/22/2020-adenocarcinoma consistent with a colon primary ?PET scan 12/30/2020-hypermetabolic hepatic lesion and portal adenopathy-increased in size compared to 12/04/2020 CT, small hypermetabolic left periaortic node ?Foundation 1-MSI high, tumor mutation burden 31, K-ras wild-type ?Cycle 1 Pembrolizumab 01/15/2021 ?Cycle 1 FOLFIRI 02/16/2021 ?Cycle 2 FOLFIRI 03/02/2021-Emend and Udenyca added ?Cycle 3 FOLFIRI 03/17/2021 ?Cycle 4 FOLFIRI 03/31/2021 ?Cycle 5 FOLFIRI 04/15/2021 ?CT abdomen/pelvis 04/23/2021-mild decrease in size of right hepatic lobe metastases, mild decrease in porta hepatis and portacaval lymphadenopathy, mild increase in left periaortic lymphadenopathy ?Cycle 6 FOLFIRI 04/28/2021 ?Cycle 7 FOLFIRI 05/12/2021 ?Cycle 8 FOLFIRI 05/26/2021 ?Cycle 9 FOLFIRI 06/09/2021 ?Cycle 10 FOLFIRI 06/30/2021 ?CTs 07/10/2021-decrease in size of right hepatic lobe metastatic lesion and periportal and retroperitoneal nodal disease.  No new or progressive findings. ?Cycle 11 FOLFIRI 07/14/2021 ?Iron deficiency anemia secondary to #1 ?Right breast cancer, stage Ia, T1b, N0, ER positive, PR positive, HER2 positive status post a right lumpectomy and adjuvant radiation/Taxol/Herceptin, anastrozole starting 01/25/2019 ?Diabetes ?Atrial fibrillation-maintained on Coumadin, Coumadin dose adjusted 03/02/2021 ?Hypothyroidism ?Ongoing tobacco use ?Family history of breast cancer-negative genetic testing ?Oxaliplatin neuropathy ?Intermittent upper abdominal discomfort-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Admission 08/04/2021 with abdomen/back pain and failure to thrive ? ?[name] appears unchanged.  She has ongoing abdominal/back pain, anorexia, nausea, and constipation.  She  has a history of metastatic colon cancer with abdominal/retroperitoneal adenopathy and pain may be related to the metastatic tumor burden.  Her prior CTs performed in March 2023 showed no evidence of disease progression.  Due to no significant improvement of her symptoms today, will order a CT of the abdomen/pelvis to reevaluate.  She will be a candidate for treatment with pembrolizumab or an EGFR inhibitor if there is evidence for disease progression. ? ?I recommend she continue a bowel regimen today.  Will not make any further adjustments today as she will receive oral contrast for her CT scan which may help the constipation as well. ? ?Recommendations: ?Continue narcotic analgesics for pain ?Continue bowel regimen ?CT abdomen/pelvis ordered today to evaluate ongoing pain. ?Coumadin per pharmacy ?Outpatient biopsy of right breast mass ? ? LOS: 2 days  ? ?Mikey Bussing, NP  ? ?08/06/2021, 9:24 AM ?[name] was interviewed and examined.  She continues to have abdomen/back pain and constipation.  She will undergo a CT today. ? ?I was present for greater than 50% of today's visit.  I performed medical decision making. ? ?Julieanne Manson, MD ?

## 2021-08-06 NOTE — Progress Notes (Signed)
Chaplain followed up on consult to complete [place]'s Advanced Directive.  Chaplain will have a notary after 11:00 am today.  Chaplain will check in on Amri after her scan today to see if she is in a place to be able to have documents notarized. ? ? ? 08/06/21 1000  ?Clinical Encounter Type  ?Visited With Patient  ?Visit Type Follow-up;Social support  ? ? ?

## 2021-08-07 DIAGNOSIS — R52 Pain, unspecified: Secondary | ICD-10-CM | POA: Diagnosis not present

## 2021-08-07 LAB — GLUCOSE, CAPILLARY
Glucose-Capillary: 100 mg/dL — ABNORMAL HIGH (ref 70–99)
Glucose-Capillary: 161 mg/dL — ABNORMAL HIGH (ref 70–99)
Glucose-Capillary: 116 mg/dL — ABNORMAL HIGH (ref 70–99)
Glucose-Capillary: 123 mg/dL — ABNORMAL HIGH (ref 70–99)

## 2021-08-07 LAB — CBC
HCT: 43.3 % (ref 36.0–46.0)
Hemoglobin: 13.9 g/dL (ref 12.0–15.0)
MCH: 29 pg (ref 26.0–34.0)
MCHC: 32.1 g/dL (ref 30.0–36.0)
MCV: 90.4 fL (ref 80.0–100.0)
Platelets: 113 10*3/uL — ABNORMAL LOW (ref 150–400)
RBC: 4.79 MIL/uL (ref 3.87–5.11)
RDW: 17.5 % — ABNORMAL HIGH (ref 11.5–15.5)
WBC: 11 10*3/uL — ABNORMAL HIGH (ref 4.0–10.5)
nRBC: 0 % (ref 0.0–0.2)

## 2021-08-07 LAB — PROTIME-INR
INR: 2.9 — ABNORMAL HIGH (ref 0.8–1.2)
Prothrombin Time: 29.7 seconds — ABNORMAL HIGH (ref 11.4–15.2)

## 2021-08-07 LAB — BASIC METABOLIC PANEL
Anion gap: 10 (ref 5–15)
BUN: 10 mg/dL (ref 8–23)
CO2: 35 mmol/L — ABNORMAL HIGH (ref 22–32)
Calcium: 8.9 mg/dL (ref 8.9–10.3)
Chloride: 93 mmol/L — ABNORMAL LOW (ref 98–111)
Creatinine, Ser: 0.34 mg/dL — ABNORMAL LOW (ref 0.44–1.00)
GFR, Estimated: >60 mL/min (ref >60)
Glucose, Bld: 93 mg/dL (ref 70–99)
Potassium: 3 mmol/L — ABNORMAL LOW (ref 3.5–5.1)
Sodium: 138 mmol/L (ref 135–145)

## 2021-08-07 MED ORDER — OXYCODONE HCL 5 MG PO TABS
5.0000 mg | ORAL_TABLET | ORAL | Status: DC | PRN
  Administered 2021-08-07 – 2021-08-08 (×3): 10 mg via ORAL
  Filled 2021-08-07 (×3): qty 2

## 2021-08-07 MED ORDER — POTASSIUM CHLORIDE CRYS ER 20 MEQ PO TBCR
40.0000 meq | EXTENDED_RELEASE_TABLET | Freq: Once | ORAL | Status: AC
  Administered 2021-08-07: 40 meq via ORAL
  Filled 2021-08-07 (×2): qty 2

## 2021-08-07 MED ORDER — WARFARIN SODIUM 2.5 MG PO TABS
2.5000 mg | ORAL_TABLET | Freq: Once | ORAL | Status: AC
  Administered 2021-08-07: 2.5 mg via ORAL
  Filled 2021-08-07: qty 1

## 2021-08-07 MED ORDER — BISACODYL 10 MG RE SUPP
10.0000 mg | Freq: Once | RECTAL | Status: AC
  Administered 2021-08-07: 10 mg via RECTAL
  Filled 2021-08-07: qty 1

## 2021-08-07 NOTE — Progress Notes (Addendum)
IP PROGRESS NOTE ? ?Subjective:  ? ?Abdominal pain better controlled this morning.  She is alternating hydrocodone with IV morphine.  She is not having any nausea or vomiting.  Still constipated.  She was started on regular diet this morning and is eating at the time my visit.  She is tolerating well so far. ? ?Objective: ?Vital signs in last 24 hours: ?Blood pressure (!) 168/86, pulse 69, temperature 97.9 ?F (36.6 ?C), temperature source Oral, resp. rate 16, height _0  (1.778 m), weight 50.5 kg, SpO2 (!) 88 %. ? ?Intake/Output from previous day: ?04/27 0701 - 04/28 0700 ?In: 3240.5 [P.O.:355; I.V.:2885.5] ?Out: -  ? ?Physical Exam: ? ?HEENT: No thrush ?Cardiac: Regular rate and rhythm ?Abdomen: No tenderness, no masses, no hepatosplenomegaly ?Extremities: No leg edema ?Breast: 2-3 cm mass at the upper outer right breast ? ?Portacath/PICC-without erythema ? ?Lab Results: ?Recent Labs  ?  08/05/21 ?3614 08/07/21 ?4315  ?WBC 11.2* 11.0*  ?HGB 13.9 13.9  ?HCT 43.2 43.3  ?PLT 158 113*  ? ? ?BMET ?Recent Labs  ?  08/05/21 ?0454 08/07/21 ?4008  ?NA 138 138  ?K 3.5 3.0*  ?CL 95* 93*  ?CO2 33* 35*  ?GLUCOSE 101* 93  ?BUN 23 10  ?CREATININE 0.42* 0.34*  ?CALCIUM 9.3 8.9  ? ? ?Lab Results  ?Component Value Date  ? CEA1 1.40 12/16/2020  ? CEA 1.57 02/16/2021  ? ? ?Studies/Results: ?CT ABDOMEN PELVIS W CONTRAST ? ?Result Date: 08/06/2021 ?CLINICAL DATA:  Metastatic colon cancer.  Constipation. EXAM: CT ABDOMEN AND PELVIS WITH CONTRAST TECHNIQUE: Multidetector CT imaging of the abdomen and pelvis was performed using the standard protocol following bolus administration of intravenous contrast. RADIATION DOSE REDUCTION: This exam was performed according to the departmental dose-optimization program which includes automated exposure control, adjustment of the mA and/or kV according to patient size and/or use of iterative reconstruction technique. CONTRAST:  139m OMNIPAQUE IOHEXOL 300 MG/ML  SOLN COMPARISON:  07/10/2021 FINDINGS:  Lower chest: No pulmonary nodules or pleural effusion. Subsegmental atelectasis noted at the right lung base. The heart is normal in size. Stable coronary artery calcifications. Hepatobiliary: The 2 adjacent medial right hepatic lobe masses appear to have coalesced into 1 large centrally necrotic lesion measuring approximately 6.6 x 5 1 cm. When measured together on the prior study this measured 5.9 x 4.3 cm. Adjacent extensive tumor thrombus in the obstructed portal vein with extensive cavernous transformation. There is a new rim enhancing lesion in the right hepatic lobe in segment 7 measuring a maximum of 14 mm. In retrospect I think this may have been faintly present on the prior study but difficult to say. Pancreas: No mass, inflammation or ductal dilatation. Numerous dilated vessels near the pancreatic head. Spleen: Borderline enlarged but stable. Adrenals/Urinary Tract: Adrenal glands and kidneys are grossly normal in stable. The bladder is unremarkable. Stomach/Bowel: Stomach, duodenum, small bowel and colon are grossly normal. There is moderate stool in the lower sigmoid colon and rectum Vascular/Lymphatic: Stable advanced atherosclerotic calcification involving the aorta and branch vessels. No aneurysm or dissection. There appears to be a filling defect in the right aspect of the IVC but I think this is mixing blood from slower flow in the right femoral and iliac veins as it looks normal on the delayed images. Persistent and slightly progressive retroperitoneal lymphadenopathy. The maximum transverse short axis diameter of the large node anterior to the aorta on image number 32/2 measures 15 mm and previously measured 11 mm. Two adjacent left para-aortic nodes on  image 35/2 have a total diameter of 24 mm and this was previously 18 mm. Right para-aortic node on image 30/2 measures 14.5 mm and previously measured 11 mm. Reproductive: Surgically absent. Other: Prominent left-sided parametrial vessels  suggesting pelvic congestion syndrome. No pelvic mass or adenopathy. No findings for omental or peritoneal surface disease. Portal venous collaterals with perigastric varices. Musculoskeletal: No significant bony findings. IMPRESSION: 1. Interval progression of disease since the prior study with enlarging coalescing mass in the right hepatic lobe medially. There is also progressive tumor thrombus obstructing the portal vein and there is a new or enlarging 14 mm rim enhancing lesion in segment 7 of the liver. 2. Progressive retroperitoneal lymphadenopathy. 3. No acute process in the abdomen or pelvis. 4. No findings for pulmonary metastatic disease at the lung bases. Aortic Atherosclerosis (ICD10-I70.0). Electronically Signed   By: Marijo Sanes M.D.   On: 08/06/2021 15:23  ? ?DG Abd Portable 1V ? ?Result Date: 08/05/2021 ?CLINICAL DATA:  Constipation EXAM: PORTABLE ABDOMEN - 1 VIEW COMPARISON:  Abdominal radiograph done on 01/19/2021 and CT done on 07/10/2021 FINDINGS: Bowel gas pattern is nonspecific. Small to moderate amount of stool is seen in the colon. There is no fecal impaction in the rectosigmoid. Surgical clips are seen in the right upper quadrant. Levoscoliosis is seen in the lumbar spine. Degenerative changes are noted with bony spurs in the lumbar spine. IMPRESSION: Nonspecific bowel gas pattern. Small to moderate stool burden in the colon without signs of fecal impaction in the rectum. Electronically Signed   By: Elmer Picker M.D.   On: 08/05/2021 12:50   ? ?Medications: I have reviewed the patient's current medications. ? ?Assessment/Plan: ? 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, stage IIIc pT3pN2b ?Moderately differentiated adenocarcinoma, 9/30 lymph nodes positive, lymphovascular invasion present including large vessel extramural invasion, loss of MLH1 and PMS2, MLH1 hyper methylation present ?Incomplete colonoscopy secondary to stricturing at the sigmoid colon ?Virtual  colonoscopy 03/13/2020- mass centered at the medial wall of the cecum and terminal ileum, adjacent ileocolic mesenteric adenopathy, nonspecific 3 mm right lower lobe nodule ?PET 38/25/0539 hypermetabolic cecal/terminal ileum mass with adjacent enlarged and hypermetabolic ileocolic lymph nodes, previously noted segment 7 liver lesion-not hypermetabolic- benign etiology favored, dominant left lower lobe pulmonary nodule not hypermetabolic, other tiny nodules not hypermetabolic and below PET sensitivity ?Cycle 1 FOLFOX 05/12/2020 ?Cycle 2 FOLFOX 05/26/2020 ?Cycle 3 FOLFOX 06/09/2020 ?Cycle 4 FOLFOX 06/23/2020 ?Cycle 5 FOLFOX 07/07/2020 ?Cycle 12 FOLFOX 10/14/2020, oxaliplatin dose reduced beginning with cycle 9 ?CT 12/04/2020-development of peripheral enhancing hypoattenuating mass in segment 5/8, necrotic porta hepatis lymphadenopathy, similar appearance of previously visualized nonhypermetabolic subcapsular segment 7 lesion ?Ultrasound-guided biopsy right liver lesion 12/22/2020-adenocarcinoma consistent with a colon primary ?PET scan 12/30/2020-hypermetabolic hepatic lesion and portal adenopathy-increased in size compared to 12/04/2020 CT, small hypermetabolic left periaortic node ?Foundation 1-MSI high, tumor mutation burden 31, K-ras wild-type ?Cycle 1 Pembrolizumab 01/15/2021 ?Cycle 1 FOLFIRI 02/16/2021 ?Cycle 2 FOLFIRI 03/02/2021-Emend and Udenyca added ?Cycle 3 FOLFIRI 03/17/2021 ?Cycle 4 FOLFIRI 03/31/2021 ?Cycle 5 FOLFIRI 04/15/2021 ?CT abdomen/pelvis 04/23/2021-mild decrease in size of right hepatic lobe metastases, mild decrease in porta hepatis and portacaval lymphadenopathy, mild increase in left periaortic lymphadenopathy ?Cycle 6 FOLFIRI 04/28/2021 ?Cycle 7 FOLFIRI 05/12/2021 ?Cycle 8 FOLFIRI 05/26/2021 ?Cycle 9 FOLFIRI 06/09/2021 ?Cycle 10 FOLFIRI 06/30/2021 ?CTs 07/10/2021-decrease in size of right hepatic lobe metastatic lesion and periportal and retroperitoneal nodal disease.  No new or progressive findings. ?Cycle 11  FOLFIRI 07/14/2021 ?Iron deficiency anemia secondary to #1 ?  Right breast cancer, stage Ia, T1b, N0, ER positive, PR positive, HER2 positive status post a right lumpectomy and adjuvant radiation/Taxol/Herceptin, ana

## 2021-08-07 NOTE — Progress Notes (Signed)
?PROGRESS NOTE ?Sabrina [name]  URK:[id number] DOB: 23-Oct-1950 DOA: 08/04/2021 ?PCP: [name], NP  ? ?Brief Narrative/Hospital Course: ?71 year old female colon cancer, history of right colectomy 04/13/2020, metastasis to right liver and periportal lymph retroperitoneal nodal disease-decreasing tumor size on last CT on 07/10/2021,on active treatment with FOLFIRI, last treated 07/15/2314,  stable portal vein thrombosis, PAF on Coumadin, T2DM, iron-deficiency anemia, history of right breast cancer status postlumpectomy and adjuvant radiation and chemo, hypothyroidism, ongoing, diabetes, oxaliplatin neuropathy had marked decline in past week  with deep achy pain in upper abdomen, constipation x 1 wk, generalized weakness, anorexia and low back pain. Patient endorses she has lost more than 12 lb In 3 wk, has had anorexia, low back pain.  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 ?She denies any fever, chills, nausea, vomiting, headache, chest pain, focal weakness.   ?She was seen at the oncology center or a follow-up appointment preceding with chemotherapy found to have severe dehydration and sent to the hospital for admission and symptom management and suspected to have progressive cancer, also noted to have palpable right breast mass on exam.  ?AT cancer center routine blood work showed stable renal function with mildly increased anion gap, leukocytosis, INR 3.3. ?With ongoing constipation pain control dehydration and abdominal pain back pain underwent CT scan abdomen 4/27 ? ?Subjective: ? ?Seen and examined this morning.  Reports she feels much better this morning able to eat some still has no bowel movement but just got on her rectal suppository  ?Abdominal pain is better this morning. ? ?Assessment and Plan: ?Principal Problem: ?  Intractable pain ?Active Problems: ?  Paroxysmal atrial fibrillation ([name]) ?  Long term (current) use of anticoagulants [Z79.01] ?  Benign essential hypertension ?   Hypothyroidism due to Hashimoto's thyroiditis ?  Type 2 diabetes mellitus without complication, without long-term current use of insulin ([name]) ?  Iron deficiency anemia due to chronic blood loss ?  Metastatic colon cancer to liver [facility]) ?  Dehydration ?  Failure to thrive in adult ?  Anorexia ?  Leukocytosis ?  Pain in the abdomen ?  Protein-calorie malnutrition, severe ([name]) ?  Breast mass, right ?  ?Intractable pain in abdomen,back pain ?Anorexia ?Weight loss ?Failure to thrive ?Metastatic colon cancer ?Severe protein calorie malnutrition: ?multifactorial in the setting of dehydration,.  CT abdomen shows evidence of disease progression and may be related to her liver metastasis.  Planning to discontinue FOLFIRI and proceeding with pembrolizumab or nivolumab for colon cancer as outpatient.  DC hydrocodone try oxy 5-10 mg every 4 hours, Dulcolax suppository, advance to regular diet. ? ?Constipation x1 week: Increasing diet and laxatives.   ?  ?Leukocytosis: Likely from hemoconcentration/reactive.  Downtrending, continue hydration  ? ?Right breast mass appears to be new-oncology planning for outpatient biopsy. Cont her anastrozole. ?  ?Paroxysmal atrial fibrillation: Rate controlled, pharmacy dosing Coumadin, therapeutic,  cont flecainide. No longer on digoxin and metoprolol. ?  ?Benign essential hypertension: BP stable,not on meds. ?  ?Hypothyroidism:  cont synthroid ?  ?G3TD W/ complication, without long-term current use of insulin: Blood sugar stable, continue to hold metformi, cont SSI as below ?Recent Labs  ?Lab 08/05/21 ?1761 08/05/21 ?6073 08/06/21 ?1209 08/06/21 ?1657 08/06/21 ?2106 08/07/21 ?7106 08/07/21 ?1215  ?GLUCAP  --    < > 108* 131* 94 100* 161*  ?HGBA1C 5.8*  --   --   --   --   --   --   ? < > =  values in this interval not displayed.  ? ?   ?Iron deficiency anemia due to chronic blood loss: stable hb hemoglobin had initial polycythemia due to hemoconcentration ?Recent Labs  ?Lab 08/04/21 ?1015  08/05/21 ?0454 08/07/21 ?1443  ?HGB 16.0* 13.9 13.9  ?HCT 50.8* 43.2 43.3  ? ?  ?Goals of care discussed with the patient and daughter at the bedside, she wishes for DNR. ? ?DVT prophylaxis: coumadin ?Code Status:   Code Status: DNR ?Family Communication: plan of care discussed with patient at bedside. ?Patient status is: inpatient level of care for ongoing pain management, IV fluid hydration: Med-Surg  ?Patient currently not stable ? ?Dispo: The patient is from: home ?           Anticipated disposition: home tomorrow if pain is stable ? ?Mobility Assessment (last 72 hours)   ? ? Mobility Assessment   ? ? [place] Name 08/06/21 0748 08/05/21 0830 08/04/21 1730  ?  ?  ? Does patient have an order for bedrest or is patient medically unstable No - Continue assessment No - Continue assessment No - Continue assessment    ? What is the highest level of mobility based on the progressive mobility assessment? Level 6 (Walks independently in room and hall) - Balance while walking in room without assist - Complete Level 6 (Walks independently in room and hall) - Balance while walking in room without assist - Complete Level 5 (Walks with assist in room/hall) - Balance while stepping forward/back and can walk in room with assist - Complete    ? ?  ?  ? ?  ?  ? ?Objective: ?Vitals last 24 hrs: ?Vitals:  ? 08/05/21 1944 08/06/21 0439 08/06/21 1404 08/07/21 0513  ?BP: (!) 162/88 (!) 169/92 (!) 132/52 (!) 168/86  ?Pulse: 64 64 72 69  ?Resp: '18 18 15 16  '$ ?Temp: (!) 97.5 ?F (36.4 ?C) 98 ?F (36.7 ?C) 97.7 ?F (36.5 ?C) 97.9 ?F (36.6 ?C)  ?TempSrc: Oral Oral Oral Oral  ?SpO2: 94% 91% 94% (!) 88%  ?Weight:      ?Height:      ? ?Weight change:  ? ?Physical Examination: ? ?General exam: Aa0x3, thin frail,older than stated age, weak appearing. ?HEENT:Oral mucosa moist, Ear/Nose WNL grossly, dentition normal. ?Respiratory system: bilaterally clear,no use of accessory muscle ?Cardiovascular system: S1 & S2 +, No JVD,. ?Gastrointestinal system:  Abdomen soft,NT,ND, BS+ ?Nervous System:Alert, awake, moving extremities and grossly nonfocal ?Extremities: edema neg,distal peripheral pulses palpable.  ?Skin: No rashes,no icterus. ?MSK: Normal muscle bulk,tone, power ? ? ? ?Medications reviewed:  ?Scheduled Meds: ? (feeding supplement) PROSource Plus  30 mL Oral BID BM  ? anastrozole  1 mg Oral Daily  ? Chlorhexidine Gluconate Cloth  6 each Topical Daily  ? feeding supplement  1 Container Oral TID BM  ? flecainide  100 mg Oral BID  ? insulin aspart  0-9 Units Subcutaneous TID WC  ? levothyroxine  100 mcg Oral Q0600  ? mouth rinse  15 mL Mouth Rinse BID  ? multivitamin with minerals  1 tablet Oral Daily  ? pantoprazole  40 mg Oral BID  ? Ensure Max Protein  11 oz Oral BID  ? senna-docusate  1 tablet Oral BID  ? sorbitol  15 mL Oral BID  ? warfarin  2.5 mg Oral ONCE-1600  ? Warfarin - Pharmacist Dosing Inpatient   Does not apply [id number]  ? ?Continuous Infusions: ? lactated ringers 125 mL/hr at 08/07/21 1048  ? ? ?  ?Diet Order   ? ?       ?  Diet regular Room service appropriate? Yes; Fluid consistency: Thin  Diet effective now       ?  ? ?  ?  ? ?  ?  ? ?Nutrition Problem: Severe Malnutrition ?Etiology: chronic illness, cancer and cancer related treatments ?Signs/Symptoms: severe fat depletion, severe muscle depletion, percent weight loss ?Interventions: Premier Protein, Boost Breeze, MVI, Prostat ? ? ?Intake/Output Summary (Last 24 hours) at 08/07/2021 1238 ?Last data filed at 08/07/2021 9147 ?Gross per 24 hour  ?Intake 2995.5 ml  ?Output --  ?Net 2995.5 ml  ? ? ?Net IO Since Admission: 5,334.51 mL [08/07/21 1238]  ?Wt Readings from Last 3 Encounters:  ?08/05/21 50.5 kg  ?08/04/21 52.4 kg  ?07/14/21 57.6 kg  ?  ? ?Unresulted Labs (From admission, onward)  ? ?  Start     Ordered  ? 08/05/21 0500  Protime-INR  Daily,   R     ? 08/04/21 1719  ? ?  ?  ? ?  ?Data Reviewed: I have personally reviewed following labs and imaging studies ?CBC: ?Recent Labs  ?Lab  08/04/21 ?1015 08/05/21 ?0454 08/07/21 ?8295  ?WBC 18.7* 11.2* 11.0*  ?NEUTROABS 17.2*  --   --   ?HGB 16.0* 13.9 13.9  ?HCT 50.8* 43.2 43.3  ?MCV 89.0 89.3 90.4  ?PLT 208 158 113*  ? ? ?Basic Metabolic Panel: ?Recent Lab

## 2021-08-07 NOTE — Plan of Care (Signed)

## 2021-08-07 NOTE — Progress Notes (Signed)
ANTICOAGULATION CONSULT NOTE  ? ?Pharmacy Consult for warfarin ?Indication: hx atrial fibrillation ? ?Allergies  ?Allergen Reactions  ? Atorvastatin Other (See Comments)  ?  Muscle aches  ? Meperidine Nausea And Vomiting  ? ? ?Patient Measurements: ?Height: '5\' 10"'$  (177.8 cm) ?Weight: 50.5 kg (111 lb 6.4 oz) ?IBW/kg (Calculated) : 68.5 ?Heparin Dosing Weight:  ? ?Vital Signs: ?Temp: 97.9 ?F (36.6 ?C) (04/28 1157) ?Temp Source: Oral (04/28 2620) ?BP: 168/86 (04/28 0513) ?Pulse Rate: 69 (04/28 0513) ? ?Labs: ?Recent Labs  ?  08/04/21 ?1015 08/05/21 ?0454 08/06/21 ?3559 08/07/21 ?7416  ?HGB 16.0* 13.9  --  13.9  ?HCT 50.8* 43.2  --  43.3  ?PLT 208 158  --  113*  ?LABPROT 33.0* 31.6* 29.2* 29.7*  ?INR 3.3* 3.1* 2.8* 2.9*  ?CREATININE 0.56 0.42*  --  0.34*  ? ? ? ?Estimated Creatinine Clearance: 52.2 mL/min (A) (by C-G formula based on SCr of 0.34 mg/dL (L)). ? ? ?Medications:  ?- PTA warfarin regimen: per pt, 10 mg daily except '5mg'$  on Mon, Wed and Sat (Last dose taken on 08/03/21) ? ?Assessment: ?Patient is a 71 y.o F with metastatic colon cancer  on chemotheraoy and hx afib on warfarin, flecainide PTA who was admitted to [facility] on 08/04/21 for evaluation of abdominal pain.  Pharmacy has been consulted to resume warfarin on admission. ? ?Today, 08/07/2021: ?- INR 2.9, in therapeutic range. ?- CBC: decreased Plts (208 > 158 > 113; has been as low as 73 on 10/14/20) - no heparin products, last pembrolizumab tx 4/4 ?- no bleeding documented ?- PO intake has been low, started nutritional supplements ? ?Goal of Therapy:  ?INR 2-3 ?Monitor platelets by anticoagulation protocol: Yes ?  ?Plan:  ?- Give lower than her usual dose of warfarin today - 2.'5mg'$  ?- Daily PT/INR ? ?Peggyann Juba, PharmD, BCPS ?Pharmacy: [phone] ?08/07/2021,7:01 AM ? ? ?

## 2021-08-08 DIAGNOSIS — R52 Pain, unspecified: Secondary | ICD-10-CM | POA: Diagnosis not present

## 2021-08-08 LAB — PROTIME-INR
INR: 2.5 — ABNORMAL HIGH (ref 0.8–1.2)
Prothrombin Time: 27.1 seconds — ABNORMAL HIGH (ref 11.4–15.2)

## 2021-08-08 LAB — GLUCOSE, CAPILLARY
Glucose-Capillary: 110 mg/dL — ABNORMAL HIGH (ref 70–99)
Glucose-Capillary: 131 mg/dL — ABNORMAL HIGH (ref 70–99)

## 2021-08-08 MED ORDER — OXYCODONE HCL 10 MG PO TABS: 10.0000 mg | ORAL_TABLET | ORAL | 0 refills | Status: DC | PRN

## 2021-08-08 MED ORDER — WARFARIN SODIUM 5 MG PO TABS: 5.0000 mg | ORAL_TABLET | Freq: Once | ORAL | Status: DC

## 2021-08-08 MED ORDER — HEPARIN SOD (PORK) LOCK FLUSH 100 UNIT/ML IV SOLN
500.0000 [IU] | Freq: Once | INTRAVENOUS | Status: AC
  Administered 2021-08-08: 500 [IU] via INTRAVENOUS
  Filled 2021-08-08: qty 5

## 2021-08-08 MED ORDER — FLEET ENEMA 7-19 GM/118ML RE ENEM
1.0000 | ENEMA | Freq: Once | RECTAL | Status: AC
  Administered 2021-08-08: 1 via RECTAL
  Filled 2021-08-08: qty 1

## 2021-08-08 MED ORDER — OXYCODONE HCL 5 MG PO TABS: 5.0000 mg | ORAL_TABLET | ORAL | 0 refills | Status: DC | PRN

## 2021-08-08 NOTE — Progress Notes (Signed)
ANTICOAGULATION CONSULT NOTE  ? ?Pharmacy Consult for warfarin ?Indication: hx atrial fibrillation ? ?Allergies  ?Allergen Reactions  ? Atorvastatin Other (See Comments)  ?  Muscle aches  ? Meperidine Nausea And Vomiting  ? ? ?Patient Measurements: ?Height: '5\' 10"'$  (177.8 cm) ?Weight: 50.5 kg (111 lb 6.4 oz) ?IBW/kg (Calculated) : 68.5 ?Heparin Dosing Weight:  ? ?Vital Signs: ?Temp: 98.3 ?F (36.8 ?C) (04/29 9407) ?Temp Source: Oral (04/29 6808) ?BP: 166/92 (04/29 8110) ?Pulse Rate: 70 (04/29 0605) ? ?Labs: ?Recent Labs  ?  08/06/21 ?3159 08/07/21 ?4585 08/08/21 ?9292  ?HGB  --  13.9  --   ?HCT  --  43.3  --   ?PLT  --  113*  --   ?LABPROT 29.2* 29.7* 27.1*  ?INR 2.8* 2.9* 2.5*  ?CREATININE  --  0.34*  --   ? ? ? ?Estimated Creatinine Clearance: 52.2 mL/min (A) (by C-G formula based on SCr of 0.34 mg/dL (L)). ? ? ?Medications:  ?- PTA warfarin regimen: per pt, 10 mg daily except '5mg'$  on Mon, Wed and Sat (Last dose taken on 08/03/21) ? ?Assessment: ?Patient is a 71 y.o F with metastatic colon cancer  on chemotheraoy and hx afib on warfarin, flecainide PTA who was admitted to [facility] on 08/04/21 for evaluation of abdominal pain.  Pharmacy has been consulted to resume warfarin on admission. ? ?Today, 08/08/2021: ?- INR 2.5, in therapeutic range. ?- CBC: decreased Plts (208 > 158 > 113; has been as low as 73 on 10/14/20) - no heparin products, last pembrolizumab tx 4/4 ?- no bleeding documented ?- PO intake improving ? ?Goal of Therapy:  ?INR 2-3 ?Monitor platelets by anticoagulation protocol: Yes ?  ?Plan:  ?- '5mg'$  warfarin today at 1600 ?- Daily PT/INR ? ? ?Adrian Saran, PharmD, BCPS ?Secure Chat if ?s ?08/08/2021 8:40 AM ? ? ? ?

## 2021-08-08 NOTE — Discharge Summary (Signed)
PatientPhysician Discharge Summary  ?[name] DPO:[id number] DOB: 09/11/1950 DOA: 08/04/2021 ? ?PCP: [name], NP ? ?Admit date: 08/04/2021 ?Discharge date: 08/08/2021 ?30 Day Unplanned Readmission Risk Score   ? ?Flowsheet Row Admission (Current) from 08/04/2021 in [place]  ?30 Day Unplanned Readmission Risk Score (%) 16.97 Filed at 08/08/2021 0800  ? ?  ? ? This score is the patient's risk of an unplanned readmission within 30 days of being discharged (0 -100%). The score is based on dignosis, age, lab data, medications, orders, and past utilization.   ?Low:  0-14.9   Medium: 15-21.9   High: 22-29.9   Extreme: 30 and above ? ?  ? ?  ? ? ? ?Admitted From: Home ?Disposition: Home ? ?Recommendations for Outpatient Follow-up:  ?Follow up with PCP in 1-2 weeks ?Please obtain BMP/CBC in one week ?Follow-up with oncology in 3 days ?Please follow up with your PCP on the following pending results: ?Unresulted Labs (From admission, onward)  ? ?  Start     Ordered  ? 08/05/21 0500  Protime-INR  Daily,   R     ? 08/04/21 1719  ? ?  ?  ? ?  ?  ? ? ?Home Health: None ?Equipment/Devices: None ? ?Discharge Condition: Stable ?CODE STATUS: DNR ?Diet recommendation: Soft/cardiac ? ?Subjective: Seen and examined.  Still with abdominal pain but slightly improved than yesterday.  She said that she had some liquid stools yesterday.  Wanted me to give her something else before discharge.  She was given Fleet enema.  Had another small bowel movement.  She was agreeable with discharge plan. ? ?Brief/Interim Summary: 71 year old female colon cancer, history of right colectomy 04/13/2020, metastasis to right liver and periportal lymph retroperitoneal nodal disease-decreasing tumor size on last CT on 07/10/2021,on active treatment with FOLFIRI, last treated 07/15/2314,  stable portal vein thrombosis, PAF on Coumadin, T2DM, iron-deficiency anemia, history of right breast cancer status postlumpectomy and adjuvant  radiation and chemo, hypothyroidism, ongoing, diabetes, oxaliplatin neuropathy had marked decline in past week  with deep achy pain in upper abdomen, constipation x 1 wk, generalized weakness, anorexia and low back pain. Patient endorses she has lost more than 12 lb In 3 wk, has had anorexia, low back pain.  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 ?She was seen at the oncology center or a follow-up appointment preceding with chemotherapy found to have severe dehydration and sent to the hospital for admission and symptom management and suspected to have progressive cancer, also noted to have palpable right breast mass on exam.  ?AT cancer center routine blood work showed stable renal function with mildly increased anion gap, leukocytosis, INR 3.3. ?With ongoing constipation pain control dehydration and abdominal pain back pain underwent CT scan abdomen 4/27 ?  ?Intractable pain in abdomen,back pain ?Anorexia ?Weight loss ?Failure to thrive ?Metastatic colon cancer ?Severe protein calorie malnutrition: ?multifactorial in the setting of dehydration,.  CT abdomen shows evidence of disease progression and may be related to her liver metastasis.  Planning to discontinue FOLFIRI and proceeding with pembrolizumab or nivolumab for colon cancer as outpatient.  She was on IV pain medications which were transitioned to oral oxycodone by her oncologist Dr.Sherril during this hospitalization.  Pain is fairly controlled.  She was discharged on oxycodone 10 mg every 4 hours as needed.  She has follow-up appointment to see her oncologist on Tuesday 3 days later. ?  ?Constipation x1 week: S/p Dulcolax suppository and Fleet  enema today.  Had bowel movements. ? ?Right breast mass appears to be new-oncology planning for outpatient biopsy. Cont her anastrozole. ?  ?Paroxysmal atrial fibrillation: Rate controlled, pharmacy dosing Coumadin, therapeutic,  cont flecainide. No longer on digoxin and metoprolol. ?   ?Benign essential hypertension: BP stable,not on meds. ?  ?P6PP W/ complication, without long-term current use of insulin: Resume home medications. ? ?Iron deficiency anemia due to chronic blood loss: stable hb hemoglobin had initial polycythemia due to hemoconcentration ?  ?Goals of care discussed with the patient and daughter at the bedside, she wishes for DNR. ? ?Discharge plan was discussed with patient and/or family member and they verbalized understanding and agreed with it.  ?Discharge Diagnoses:  ?Principal Problem: ?  Intractable pain ?Active Problems: ?  Paroxysmal atrial fibrillation ([place]) ?  Long term (current) use of anticoagulants [Z79.01] ?  Benign essential hypertension ?  Hypothyroidism due to Hashimoto's thyroiditis ?  Type 2 diabetes mellitus without complication, without long-term current use of insulin ([place]) ?  Iron deficiency anemia due to chronic blood loss ?  Metastatic colon cancer to liver [facility]) ?  Dehydration ?  Failure to thrive in adult ?  Anorexia ?  Leukocytosis ?  Pain in the abdomen ?  Protein-calorie malnutrition, severe ([place]) ?  Breast mass, right ? ? ? ?Discharge Instructions ? ? ?Allergies as of 08/08/2021   ? ?   Reactions  ? Atorvastatin Other (See Comments)  ? Muscle aches  ? Meperidine Nausea And Vomiting  ? ?  ? ?  ?Medication List  ?  ? ?STOP taking these medications   ? ?sorbitol 70 % solution ?  ? ?  ? ?TAKE these medications   ? ?anastrozole 1 MG tablet ?Commonly known as: ARIMIDEX ?TAKE 1 TABLET DAILY ?  ?flecainide 100 MG tablet ?Commonly known as: TAMBOCOR ?Take 1 tablet (100 mg total) by mouth 2 (two) times daily. ?What changed: when to take this ?  ?HYDROcodone-acetaminophen 5-325 MG tablet ?Commonly known as: NORCO/VICODIN ?Take 1 tablet by mouth every 6 (six) hours as needed for moderate pain. ?  ?levothyroxine 100 MCG tablet ?Commonly known as: SYNTHROID ?Take 100 mcg by mouth at bedtime. ?  ?lidocaine-prilocaine cream ?Commonly known as: EMLA ?Apply 1  application. topically as needed. ?What changed: reasons to take this ?  ?Melatonin 3 MG Caps ?Take 9 mg by mouth at bedtime as needed (for sleep). ?  ?metFORMIN 500 MG tablet ?Commonly known as: GLUCOPHAGE ?Take 500 mg by mouth at bedtime. ?  ?oxyCODONE 5 MG immediate release tablet ?Commonly known as: Roxicodone ?Take 1 tablet (5 mg total) by mouth every 4 (four) hours as needed for severe pain. ?What changed: You were already taking a medication with the same name, and this prescription was added. Make sure you understand how and when to take each. ?  ?Oxycodone HCl 10 MG Tabs ?Take 1 tablet (10 mg total) by mouth every 4 (four) hours as needed for severe pain. ?What changed:  ?medication strength ?how much to take ?  ?pantoprazole 40 MG tablet ?Commonly known as: Protonix ?Take 1 tablet (40 mg total) by mouth 2 (two) times daily. ?What changed:  ?when to take this ?reasons to take this ?  ?potassium chloride SA 20 MEQ tablet ?Commonly known as: KLOR-CON M ?Take 1 tablet (20 mEq total) by mouth daily. ?  ?prochlorperazine 10 MG tablet ?Commonly known as: COMPAZINE ?Take 1 tablet (10 mg total) by mouth every 6 (six) hours as needed for nausea  or vomiting. ?  ?warfarin 10 MG tablet ?Commonly known as: COUMADIN ?Take as directed. If you are unsure how to take this medication, talk to your nurse or doctor. ?Original instructions: Take 1-2 tablets Daily or as prescribed by Coumadin Clinic ?What changed:  ?how much to take ?how to take this ?when to take this ?additional instructions ?  ? ?  ? ? Follow-up Information   ? ? [name], NP Follow up in 1 week(s).   ?Specialty: Family Medicine ?Contact information: ?[address] ?[place] [address] ?[phone] ? ? ?  ?  ? ? Park Liter, MD .   ?Specialty: Cardiology ?Contact information: ?[address] ?[place] [address] ?[phone] ? ? ?  ?  ? ?  ?  ? ?  ? ?Allergies  ?Allergen Reactions  ? Atorvastatin Other (See Comments)  ?  Muscle aches  ?  Meperidine Nausea And Vomiting  ? ? ?Consultations: Oncology ? ? ?Procedures/Studies: ?CT ABDOMEN PELVIS W CONTRAST ? ?Result Date: 08/06/2021 ?CLINICAL DATA:  Metastatic colon cancer.  Constipation. EXAM: CT ABDOMEN AND PELVIS WI

## 2021-08-08 NOTE — Plan of Care (Signed)
Patient ID: Sabrina [name], female   DOB: [birth year], 71 y.o.   MRN: [id number] ? ?Problem: Education: ?Goal: Knowledge of General Education information will improve ?Description: Including pain rating scale, medication(s)/side effects and non-pharmacologic comfort measures ?Outcome: Adequate for Discharge ?  ?Problem: Health Behavior/Discharge Planning: ?Goal: Ability to manage health-related needs will improve ?Outcome: Adequate for Discharge ?  ?Problem: Clinical Measurements: ?Goal: Ability to maintain clinical measurements within normal limits will improve ?Outcome: Adequate for Discharge ?Goal: Will remain free from infection ?Outcome: Adequate for Discharge ?Goal: Diagnostic test results will improve ?Outcome: Adequate for Discharge ?Goal: Respiratory complications will improve ?Outcome: Adequate for Discharge ?Goal: Cardiovascular complication will be avoided ?Outcome: Adequate for Discharge ?  ?Problem: Activity: ?Goal: Risk for activity intolerance will decrease ?Outcome: Adequate for Discharge ?  ?Problem: Nutrition: ?Goal: Adequate nutrition will be maintained ?Outcome: Adequate for Discharge ?  ?Problem: Coping: ?Goal: Level of anxiety will decrease ?Outcome: Adequate for Discharge ?  ?Problem: Elimination: ?Goal: Will not experience complications related to bowel motility ?Outcome: Adequate for Discharge ?Goal: Will not experience complications related to urinary retention ?Outcome: Adequate for Discharge ?  ?Problem: Pain Managment: ?Goal: General experience of comfort will improve ?Outcome: Adequate for Discharge ?  ?Problem: Safety: ?Goal: Ability to remain free from injury will improve ?Outcome: Adequate for Discharge ?  ?Problem: Skin Integrity: ?Goal: Risk for impaired skin integrity will decrease ?Outcome: Adequate for Discharge ?  ? ?Haydee Salter, RN ? ?

## 2021-08-08 NOTE — Evaluation (Signed)
Physical Therapy Evaluation ?Patient Details ?Name: Sabrina [name] ?MRN: [id number] ?DOB: 11/19/1950 ?Today's Date: 08/08/2021 ? ?History of Present Illness ? Patient is 71 y.o. female presented from cancer center after routine blood work showed stable renal function with mildly increased anion gap, leukocytosis. Pt also c/o ongoing constipation pain control dehydration and abdominal pain back pain. PMH significant for colon cancer, history of right colectomy 04/13/2020, metastasis to right liver and periportal lymph retroperitoneal nodal disease-decreasing tumor size on last CT on 07/10/2021,on active treatment with FOLFIRI, last treated 07/15/2314,  stable portal vein thrombosis, PAF on Coumadin, T2DM, iron-deficiency anemia, history of right breast cancer status postlumpectomy and adjuvant radiation and chemo, hypothyroidism, ongoing, diabetes, oxaliplatin neuropathy. ?  ?Clinical Impression ? Sabrina [name] is 71 y.o. female admitted with above HPI and diagnosis. Patient is currently limited by functional impairments below (see PT problem list). Patient lives alone and is independent at baseline; she has assist available from her daughter and family when needed. Currently pt requires min guard/supervision for transfers and gait with no device. She denies falls in the last 6 months. Patient will benefit from continued skilled PT interventions to address impairments and progress independence with mobility, recommending HHPT. Acute PT will follow and progress as able.    ?   ? ?Recommendations for follow up therapy are one component of a multi-disciplinary discharge planning process, led by the attending physician.  Recommendations may be updated based on patient status, additional functional criteria and insurance authorization. ? ?Follow Up Recommendations Home health PT ? ?  ?Assistance Recommended at Discharge Intermittent Supervision/Assistance  ?Patient can return home with the following ?   ? ?  ?Equipment  Recommendations None recommended by PT  ?Recommendations for Other Services ?    ?  ?Functional Status Assessment Patient has had a recent decline in their functional status and demonstrates the ability to make significant improvements in function in a reasonable and predictable amount of time.  ? ?  ?Precautions / Restrictions Precautions ?Precautions: Fall ?Restrictions ?Weight Bearing Restrictions: No  ? ?  ? ?Mobility ? Bed Mobility ?Overal bed mobility: Modified Independent ?  ?  ?  ?  ?  ?  ?General bed mobility comments: HOB elevated, pt taking extra time ?  ? ?Transfers ?Overall transfer level: Needs assistance ?Equipment used: None ?Transfers: Sit to/from Stand ?Sit to Stand: Supervision, Min guard ?  ?  ?  ?  ?  ?General transfer comment: supervision for safety with rise from EOB and toilet. ?  ? ?Ambulation/Gait ?Ambulation/Gait assistance: Min guard, Supervision ?Gait Distance (Feet): 110 Feet ?Assistive device: None, 1 person hand held assist ?Gait Pattern/deviations: Step-through pattern, Decreased stride length, Drifts right/left, Narrow base of support ?Gait velocity: decr to fair ?  ?  ?General Gait Details: cues for arm swing with no UE support and to defer pt from reaching for external support of wall/furniture. HHA provided and no major change in balance with single UE support. ? ?Stairs ?  ?  ?  ?  ?  ? ?Wheelchair Mobility ?  ? ?Modified Rankin (Stroke Patients Only) ?  ? ?  ? ?Balance Overall balance assessment: Mild deficits observed, not formally tested (denies falls in last 6 months) ?  ?  ?  ?  ?  ?  ?  ?  ?  ?  ?  ?  ?  ?  ?  ?  ?  ?  ?   ? ? ? ?Pertinent Vitals/Pain Pain Assessment ?Pain Assessment: No/denies  pain  ? ? ?Home Living Family/patient expects to be discharged to:: Private residence ?Living Arrangements: Alone ?Available Help at Discharge: Family;Neighbor ?Type of Home: House ?  ?  ?  ?  ?  ?  ?Additional Comments: daughter and grandson and neighbors all help out  ?  ?Prior  Function Prior Level of Function : Independent/Modified Independent ?  ?  ?  ?  ?  ?  ?  ?  ?  ? ? ?Hand Dominance  ? Dominant Hand: Right ? ?  ?Extremity/Trunk Assessment  ? Upper Extremity Assessment ?Upper Extremity Assessment: Overall WFL for tasks assessed ?  ? ?Lower Extremity Assessment ?Lower Extremity Assessment: Overall WFL for tasks assessed ?  ? ?Cervical / Trunk Assessment ?Cervical / Trunk Assessment: Normal;Other exceptions ?Cervical / Trunk Exceptions: frail  ?Communication  ? Communication: No difficulties  ?Cognition Arousal/Alertness: Awake/alert ?Behavior During Therapy: [facility] for tasks assessed/performed ?Overall Cognitive Status: Within Functional Limits for tasks assessed ?  ?  ?  ?  ?  ?  ?  ?  ?  ?  ?  ?  ?  ?  ?  ?  ?  ?  ?  ? ?  ?General Comments   ? ?  ?Exercises    ? ?Assessment/Plan  ?  ?PT Assessment Patient needs continued PT services  ?PT Problem List Decreased strength;Decreased activity tolerance;Decreased balance;Decreased mobility;Decreased knowledge of use of DME ? ?   ?  ?PT Treatment Interventions DME instruction;Gait training;Stair training;Functional mobility training;Therapeutic activities;Therapeutic exercise;Balance training;Neuromuscular re-education;Patient/family education   ? ?PT Goals (Current goals can be found in the Care Plan section)  ?Acute Rehab PT Goals ?Patient Stated Goal: get strength back ?PT Goal Formulation: With patient ?Time For Goal Achievement: 08/15/21 ?Potential to Achieve Goals: Good ? ?  ?Frequency Min 3X/week ?  ? ? ?Co-evaluation   ?  ?  ?  ?  ? ? ?  ?AM-PAC PT "6 Clicks" Mobility  ?Outcome Measure Help needed turning from your back to your side while in a flat bed without using bedrails?: None ?Help needed moving from lying on your back to sitting on the side of a flat bed without using bedrails?: None ?Help needed moving to and from a bed to a chair (including a wheelchair)?: A Little ?Help needed standing up from a chair using your arms (e.g.,  wheelchair or bedside chair)?: A Little ?Help needed to walk in hospital room?: A Little ?Help needed climbing 3-5 steps with a railing? : A Little ?6 Click Score: 20 ? ?  ?End of Session Equipment Utilized During Treatment: Gait belt ?Activity Tolerance: Patient tolerated treatment well ?Patient left: with call bell/phone within reach;in bed ?Nurse Communication: Mobility status ?PT Visit Diagnosis: Muscle weakness (generalized) (M62.81);Difficulty in walking, not elsewhere classified (R26.2);Unsteadiness on feet (R26.81) ?  ? ?Time: 5102-5852 ?PT Time Calculation (min) (ACUTE ONLY): 21 min ? ? ?Charges:   PT Evaluation ?$PT Eval Low Complexity: 1 Low ?  ?  ?   ? ? ?Gwynneth Albright PT, DPT ?Acute Rehabilitation Services ?Office [phone] ?Pager [phone]  ? ?Jacques Navy ?08/08/2021, 12:35 PM ? ?

## 2021-08-10 ENCOUNTER — Other Ambulatory Visit: Payer: Self-pay | Admitting: *Deleted

## 2021-08-10 DIAGNOSIS — C189 Malignant neoplasm of colon, unspecified: Secondary | ICD-10-CM

## 2021-08-11 ENCOUNTER — Inpatient Hospital Stay: Payer: Medicare Other | Admitting: Nurse Practitioner

## 2021-08-11 ENCOUNTER — Inpatient Hospital Stay: Payer: Medicare Other

## 2021-08-11 ENCOUNTER — Telehealth: Payer: Self-pay

## 2021-08-11 ENCOUNTER — Emergency Department (HOSPITAL_COMMUNITY): Payer: Medicare Other

## 2021-08-11 ENCOUNTER — Other Ambulatory Visit: Payer: Self-pay

## 2021-08-11 ENCOUNTER — Inpatient Hospital Stay (HOSPITAL_COMMUNITY)
Admission: EM | Admit: 2021-08-11 | Discharge: 2021-08-17 | DRG: 391 | Disposition: A | Discharge status: Skilled Nursing Facility | Payer: Medicare Other | Attending: Internal Medicine | Admitting: Internal Medicine

## 2021-08-11 ENCOUNTER — Encounter (HOSPITAL_COMMUNITY): Payer: Self-pay

## 2021-08-11 DIAGNOSIS — Z681 Body mass index (BMI) 19 or less, adult: Secondary | ICD-10-CM

## 2021-08-11 DIAGNOSIS — E119 Type 2 diabetes mellitus without complications: Secondary | ICD-10-CM

## 2021-08-11 DIAGNOSIS — Z85038 Personal history of other malignant neoplasm of large intestine: Secondary | ICD-10-CM

## 2021-08-11 DIAGNOSIS — G893 Neoplasm related pain (acute) (chronic): Secondary | ICD-10-CM | POA: Diagnosis present

## 2021-08-11 DIAGNOSIS — I480 Paroxysmal atrial fibrillation: Secondary | ICD-10-CM | POA: Diagnosis present

## 2021-08-11 DIAGNOSIS — F17210 Nicotine dependence, cigarettes, uncomplicated: Secondary | ICD-10-CM | POA: Diagnosis present

## 2021-08-11 DIAGNOSIS — R1011 Right upper quadrant pain: Secondary | ICD-10-CM

## 2021-08-11 DIAGNOSIS — E038 Other specified hypothyroidism: Secondary | ICD-10-CM | POA: Diagnosis present

## 2021-08-11 DIAGNOSIS — E063 Autoimmune thyroiditis: Secondary | ICD-10-CM | POA: Diagnosis present

## 2021-08-11 DIAGNOSIS — C787 Secondary malignant neoplasm of liver and intrahepatic bile duct: Secondary | ICD-10-CM | POA: Diagnosis present

## 2021-08-11 DIAGNOSIS — D638 Anemia in other chronic diseases classified elsewhere: Secondary | ICD-10-CM | POA: Diagnosis present

## 2021-08-11 DIAGNOSIS — Z79899 Other long term (current) drug therapy: Secondary | ICD-10-CM

## 2021-08-11 DIAGNOSIS — E873 Alkalosis: Secondary | ICD-10-CM | POA: Diagnosis not present

## 2021-08-11 DIAGNOSIS — Z923 Personal history of irradiation: Secondary | ICD-10-CM

## 2021-08-11 DIAGNOSIS — R64 Cachexia: Secondary | ICD-10-CM | POA: Diagnosis present

## 2021-08-11 DIAGNOSIS — Z90710 Acquired absence of both cervix and uterus: Secondary | ICD-10-CM

## 2021-08-11 DIAGNOSIS — Z66 Do not resuscitate: Secondary | ICD-10-CM | POA: Diagnosis present

## 2021-08-11 DIAGNOSIS — K5900 Constipation, unspecified: Secondary | ICD-10-CM | POA: Diagnosis not present

## 2021-08-11 DIAGNOSIS — Z833 Family history of diabetes mellitus: Secondary | ICD-10-CM

## 2021-08-11 DIAGNOSIS — Z79811 Long term (current) use of aromatase inhibitors: Secondary | ICD-10-CM

## 2021-08-11 DIAGNOSIS — Z9221 Personal history of antineoplastic chemotherapy: Secondary | ICD-10-CM

## 2021-08-11 DIAGNOSIS — C189 Malignant neoplasm of colon, unspecified: Secondary | ICD-10-CM | POA: Diagnosis present

## 2021-08-11 DIAGNOSIS — C7989 Secondary malignant neoplasm of other specified sites: Secondary | ICD-10-CM | POA: Diagnosis present

## 2021-08-11 DIAGNOSIS — R791 Abnormal coagulation profile: Secondary | ICD-10-CM | POA: Diagnosis not present

## 2021-08-11 DIAGNOSIS — K5909 Other constipation: Secondary | ICD-10-CM | POA: Diagnosis not present

## 2021-08-11 DIAGNOSIS — E43 Unspecified severe protein-calorie malnutrition: Secondary | ICD-10-CM | POA: Diagnosis present

## 2021-08-11 DIAGNOSIS — Z888 Allergy status to other drugs, medicaments and biological substances status: Secondary | ICD-10-CM

## 2021-08-11 DIAGNOSIS — D72829 Elevated white blood cell count, unspecified: Secondary | ICD-10-CM | POA: Diagnosis present

## 2021-08-11 DIAGNOSIS — R627 Adult failure to thrive: Secondary | ICD-10-CM | POA: Diagnosis present

## 2021-08-11 DIAGNOSIS — I10 Essential (primary) hypertension: Secondary | ICD-10-CM | POA: Diagnosis present

## 2021-08-11 DIAGNOSIS — Z7984 Long term (current) use of oral hypoglycemic drugs: Secondary | ICD-10-CM

## 2021-08-11 DIAGNOSIS — Z79890 Hormone replacement therapy (postmenopausal): Secondary | ICD-10-CM

## 2021-08-11 DIAGNOSIS — Z9049 Acquired absence of other specified parts of digestive tract: Secondary | ICD-10-CM

## 2021-08-11 DIAGNOSIS — Z803 Family history of malignant neoplasm of breast: Secondary | ICD-10-CM

## 2021-08-11 DIAGNOSIS — F32A Depression, unspecified: Secondary | ICD-10-CM | POA: Diagnosis present

## 2021-08-11 DIAGNOSIS — E876 Hypokalemia: Secondary | ICD-10-CM | POA: Diagnosis present

## 2021-08-11 DIAGNOSIS — R531 Weakness: Secondary | ICD-10-CM

## 2021-08-11 DIAGNOSIS — Z79891 Long term (current) use of opiate analgesic: Secondary | ICD-10-CM

## 2021-08-11 DIAGNOSIS — Z853 Personal history of malignant neoplasm of breast: Secondary | ICD-10-CM

## 2021-08-11 DIAGNOSIS — I81 Portal vein thrombosis: Secondary | ICD-10-CM | POA: Diagnosis present

## 2021-08-11 DIAGNOSIS — I472 Ventricular tachycardia, unspecified: Secondary | ICD-10-CM | POA: Diagnosis not present

## 2021-08-11 DIAGNOSIS — C772 Secondary and unspecified malignant neoplasm of intra-abdominal lymph nodes: Secondary | ICD-10-CM | POA: Diagnosis present

## 2021-08-11 DIAGNOSIS — E785 Hyperlipidemia, unspecified: Secondary | ICD-10-CM | POA: Diagnosis present

## 2021-08-11 DIAGNOSIS — Z1501 Genetic susceptibility to malignant neoplasm of breast: Secondary | ICD-10-CM

## 2021-08-11 DIAGNOSIS — Z8249 Family history of ischemic heart disease and other diseases of the circulatory system: Secondary | ICD-10-CM

## 2021-08-11 DIAGNOSIS — Z7901 Long term (current) use of anticoagulants: Secondary | ICD-10-CM

## 2021-08-11 DIAGNOSIS — D500 Iron deficiency anemia secondary to blood loss (chronic): Secondary | ICD-10-CM | POA: Diagnosis present

## 2021-08-11 LAB — COMPREHENSIVE METABOLIC PANEL
ALT: 15 U/L (ref 0–44)
AST: 24 U/L (ref 15–41)
Albumin: 4.1 g/dL (ref 3.5–5.0)
Alkaline Phosphatase: 124 U/L (ref 38–126)
Anion gap: 13 (ref 5–15)
BUN: 24 mg/dL — ABNORMAL HIGH (ref 8–23)
CO2: 33 mmol/L — ABNORMAL HIGH (ref 22–32)
Calcium: 9.6 mg/dL (ref 8.9–10.3)
Chloride: 92 mmol/L — ABNORMAL LOW (ref 98–111)
Creatinine, Ser: 0.66 mg/dL (ref 0.44–1.00)
GFR, Estimated: >60 mL/min (ref >60)
Glucose, Bld: 155 mg/dL — ABNORMAL HIGH (ref 70–99)
Potassium: 3.1 mmol/L — ABNORMAL LOW (ref 3.5–5.1)
Sodium: 138 mmol/L (ref 135–145)
Total Bilirubin: 1.1 mg/dL (ref 0.3–1.2)
Total Protein: 7 g/dL (ref 6.5–8.1)

## 2021-08-11 LAB — CBC
HCT: 46.7 % — ABNORMAL HIGH (ref 36.0–46.0)
Hemoglobin: 15.3 g/dL — ABNORMAL HIGH (ref 12.0–15.0)
MCH: 29.3 pg (ref 26.0–34.0)
MCHC: 32.8 g/dL (ref 30.0–36.0)
MCV: 89.3 fL (ref 80.0–100.0)
Platelets: 150 10*3/uL (ref 150–400)
RBC: 5.23 MIL/uL — ABNORMAL HIGH (ref 3.87–5.11)
RDW: 18.4 % — ABNORMAL HIGH (ref 11.5–15.5)
WBC: 16.2 10*3/uL — ABNORMAL HIGH (ref 4.0–10.5)
nRBC: 0 % (ref 0.0–0.2)

## 2021-08-11 LAB — LIPASE, BLOOD: Lipase: 28 U/L (ref 11–51)

## 2021-08-11 LAB — PROTIME-INR
INR: 1.5 — ABNORMAL HIGH (ref 0.8–1.2)
Prothrombin Time: 18.2 seconds — ABNORMAL HIGH (ref 11.4–15.2)

## 2021-08-11 LAB — MAGNESIUM: Magnesium: 2 mg/dL (ref 1.7–2.4)

## 2021-08-11 MED ORDER — POTASSIUM CHLORIDE IN NACL 40-0.9 MEQ/L-% IV SOLN
INTRAVENOUS | Status: DC
  Filled 2021-08-11 (×2): qty 1000

## 2021-08-11 MED ORDER — ONDANSETRON HCL 4 MG/2ML IJ SOLN: 4.0000 mg | Freq: Four times a day (QID) | INTRAMUSCULAR | Status: DC | PRN

## 2021-08-11 MED ORDER — WARFARIN SODIUM 5 MG PO TABS
10.0000 mg | ORAL_TABLET | Freq: Once | ORAL | Status: AC
  Administered 2021-08-11: 10 mg via ORAL
  Filled 2021-08-11: qty 2

## 2021-08-11 MED ORDER — ONDANSETRON HCL 4 MG/2ML IJ SOLN
4.0000 mg | Freq: Once | INTRAMUSCULAR | Status: AC
  Administered 2021-08-11: 4 mg via INTRAVENOUS
  Filled 2021-08-11: qty 2

## 2021-08-11 MED ORDER — HYDROMORPHONE HCL 1 MG/ML IJ SOLN
1.0000 mg | INTRAMUSCULAR | Status: DC | PRN
  Administered 2021-08-12 (×2): 1 mg via INTRAVENOUS
  Filled 2021-08-11 (×2): qty 1

## 2021-08-11 MED ORDER — POLYETHYLENE GLYCOL 3350 17 G PO PACK
17.0000 g | PACK | Freq: Two times a day (BID) | ORAL | Status: DC
  Administered 2021-08-11 – 2021-08-15 (×8): 17 g via ORAL
  Filled 2021-08-11 (×8): qty 1

## 2021-08-11 MED ORDER — IOHEXOL 350 MG/ML SOLN
75.0000 mL | Freq: Once | INTRAVENOUS | Status: AC | PRN
  Administered 2021-08-11: 50 mL via INTRAVENOUS

## 2021-08-11 MED ORDER — ACETAMINOPHEN 650 MG RE SUPP
650.0000 mg | Freq: Four times a day (QID) | RECTAL | Status: DC | PRN
Start: 2021-08-11 — End: 2021-08-17

## 2021-08-11 MED ORDER — POTASSIUM CHLORIDE 20 MEQ PO PACK
40.0000 meq | PACK | Freq: Once | ORAL | Status: AC
  Administered 2021-08-11: 40 meq via ORAL
  Filled 2021-08-11: qty 2

## 2021-08-11 MED ORDER — ACETAMINOPHEN 325 MG PO TABS
650.0000 mg | ORAL_TABLET | Freq: Four times a day (QID) | ORAL | Status: DC | PRN
  Administered 2021-08-16: 650 mg via ORAL
  Filled 2021-08-11: qty 2

## 2021-08-11 MED ORDER — IOHEXOL 300 MG/ML  SOLN
80.0000 mL | Freq: Once | INTRAMUSCULAR | Status: AC | PRN
  Administered 2021-08-11: 80 mL via INTRAVENOUS

## 2021-08-11 MED ORDER — HYDROMORPHONE HCL 1 MG/ML IJ SOLN
1.0000 mg | Freq: Once | INTRAMUSCULAR | Status: AC
  Administered 2021-08-11: 1 mg via INTRAVENOUS
  Filled 2021-08-11: qty 1

## 2021-08-11 MED ORDER — WARFARIN - PHARMACIST DOSING INPATIENT: Freq: Every day | Status: DC

## 2021-08-11 MED ORDER — METFORMIN HCL 500 MG PO TABS: 500.0000 mg | ORAL_TABLET | Freq: Two times a day (BID) | ORAL | Status: DC

## 2021-08-11 MED ORDER — SORBITOL 70 % SOLN
960.0000 mL | TOPICAL_OIL | Freq: Once | ORAL | Status: AC | PRN
  Administered 2021-08-13: 960 mL via RECTAL
  Filled 2021-08-11: qty 473

## 2021-08-11 MED ORDER — LEVOTHYROXINE SODIUM 100 MCG PO TABS
100.0000 ug | ORAL_TABLET | Freq: Every day | ORAL | Status: DC
  Administered 2021-08-11 – 2021-08-16 (×6): 100 ug via ORAL
  Filled 2021-08-11 (×6): qty 1

## 2021-08-11 MED ORDER — FLECAINIDE ACETATE 100 MG PO TABS
100.0000 mg | ORAL_TABLET | Freq: Every day | ORAL | Status: DC
Start: 2021-08-11 — End: 2021-08-17
  Administered 2021-08-11 – 2021-08-16 (×6): 100 mg via ORAL
  Filled 2021-08-11 (×7): qty 1

## 2021-08-11 MED ORDER — GLYCERIN (LAXATIVE) 2 G RE SUPP
1.0000 | Freq: Once | RECTAL | Status: AC
  Administered 2021-08-11: 1 via RECTAL
  Filled 2021-08-11: qty 1

## 2021-08-11 MED ORDER — ANASTROZOLE 1 MG PO TABS
1.0000 mg | ORAL_TABLET | Freq: Every day | ORAL | Status: DC
  Administered 2021-08-12 – 2021-08-17 (×6): 1 mg via ORAL
  Filled 2021-08-11 (×6): qty 1

## 2021-08-11 MED ORDER — FLEET ENEMA 7-19 GM/118ML RE ENEM
1.0000 | ENEMA | Freq: Once | RECTAL | Status: AC
  Administered 2021-08-11: 1 via RECTAL
  Filled 2021-08-11: qty 1

## 2021-08-11 MED ORDER — SENNOSIDES-DOCUSATE SODIUM 8.6-50 MG PO TABS
1.0000 | ORAL_TABLET | Freq: Two times a day (BID) | ORAL | Status: DC
  Administered 2021-08-11 – 2021-08-17 (×12): 1 via ORAL
  Filled 2021-08-11 (×12): qty 1

## 2021-08-11 MED ORDER — OXYCODONE HCL 5 MG PO TABS
10.0000 mg | ORAL_TABLET | ORAL | Status: DC | PRN
  Administered 2021-08-12: 10 mg via ORAL
  Filled 2021-08-11: qty 2

## 2021-08-11 MED ORDER — PANTOPRAZOLE SODIUM 40 MG PO TBEC
40.0000 mg | DELAYED_RELEASE_TABLET | Freq: Two times a day (BID) | ORAL | Status: DC | PRN
  Administered 2021-08-13 – 2021-08-15 (×3): 40 mg via ORAL
  Filled 2021-08-11 (×4): qty 1

## 2021-08-11 MED ORDER — ONDANSETRON HCL 4 MG PO TABS
4.0000 mg | ORAL_TABLET | Freq: Four times a day (QID) | ORAL | Status: DC | PRN
  Filled 2021-08-11: qty 1

## 2021-08-11 MED ORDER — BISACODYL 10 MG RE SUPP
10.0000 mg | Freq: Every day | RECTAL | Status: DC | PRN
  Administered 2021-08-12: 10 mg via RECTAL
  Filled 2021-08-11: qty 1

## 2021-08-11 MED ORDER — SODIUM CHLORIDE 0.9% FLUSH
3.0000 mL | Freq: Two times a day (BID) | INTRAVENOUS | Status: DC
  Administered 2021-08-11 – 2021-08-15 (×9): 3 mL via INTRAVENOUS

## 2021-08-11 MED ORDER — MELATONIN 3 MG PO TABS
9.0000 mg | ORAL_TABLET | Freq: Every evening | ORAL | Status: DC | PRN
  Administered 2021-08-12: 9 mg via ORAL
  Filled 2021-08-11: qty 3

## 2021-08-11 MED ORDER — LACTATED RINGERS IV BOLUS
1000.0000 mL | Freq: Once | INTRAVENOUS | Status: AC
  Administered 2021-08-11: 1000 mL via INTRAVENOUS

## 2021-08-11 NOTE — ED Provider Notes (Signed)
?[facility] DEPT ?Provider Note ? ? ?CSN: [id number] ?Arrival date & time: 08/11/21  1003 ? ?  ? ?History ? ?Chief Complaint  ?Patient presents with  ? cancer patient  ? Constipation  ? Abdominal Pain  ? ? ?Skylee Baird is a 71 y.o. female. ? ? ?Constipation ?Associated symptoms: abdominal pain and nausea   ?Associated symptoms: no vomiting   ?Abdominal Pain ?Associated symptoms: constipation and nausea   ?Associated symptoms: no vomiting   ? 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  The patient states that she has not had a bowel movement in the past 2 weeks.  On chart review, She had multiple CTs of the abdomen pelvis during her last admission which showed no evidence of bowel obstruction, moderate stool burden noted in the colon.  She did undergo declog suppository and Fleet enema and reportedly had bowel movements prior to discharge.  She states that she has not had a bowel movement since discharge.  She endorses worsening generalized abdominal discomfort with associated nausea.  She denies any vomiting.  She has had decreased oral intake.  She denies any fevers or chills.  She think she is passing gas. ? ?Home Medications ?Prior to Admission medications   ?Medication Sig Start Date End Date Taking? Authorizing Provider  ?anastrozole (ARIMIDEX) 1 MG tablet TAKE 1 TABLET DAILY ?Patient taking differently: Take 1 mg by mouth daily. 03/31/20  Yes Nicholas Lose, MD  ?flecainide (TAMBOCOR) 100 MG tablet Take 1 tablet (100 mg total) by mouth 2 (two) times daily. ?Patient taking differently:  Take 100 mg by mouth at bedtime. 02/19/21  Yes Park Liter, MD  ?HYDROcodone-acetaminophen (NORCO/VICODIN) 5-325 MG tablet Take 1 tablet by mouth every 6 (six) hours as needed for moderate pain. 06/30/21  Yes Ladell Pier, MD  ?levothyroxine (SYNTHROID, LEVOTHROID) 100 MCG tablet Take 100 mcg by mouth at bedtime.   Yes [provider]  ?lidocaine-prilocaine (EMLA) cream Apply 1 application. topically as needed. ?Patient taking differently: Apply 1 application. topically as needed (for port access). 06/30/21  Yes Ladell Pier, MD  ?Melatonin 3 MG CAPS Take 9 mg by mouth at bedtime as needed (for sleep).   Yes [provider]  ?metFORMIN (GLUCOPHAGE) 500 MG tablet Take 500 mg by mouth 2 (two) times daily with a meal. 11/29/16  Yes [provider]  ?oxyCODONE (ROXICODONE) 5 MG immediate release tablet Take 1 tablet (5 mg total) by mouth every 4 (four) hours as needed for severe pain. 08/08/21  Yes Darliss Cheney, MD  ?oxyCODONE 10 MG TABS Take 1 tablet (10 mg total) by mouth every 4 (four) hours as needed for severe pain. 08/08/21  Yes Darliss Cheney, MD  ?pantoprazole (PROTONIX) 40 MG tablet Take 1 tablet (40 mg total) by mouth 2 (two) times daily. ?Patient taking differently: Take 40 mg by mouth 2 (two) times daily as needed (for reflux). 07/14/21  Yes Owens Shark, NP  ?prochlorperazine (COMPAZINE) 10 MG tablet Take 1 tablet (10 mg total) by mouth every 6 (six) hours as needed for nausea or vomiting. 01/19/21  Yes Owens Shark, NP  ?warfarin (COUMADIN) 10 MG tablet Take 1-2 tablets Daily  or as prescribed by [facility] ?Patient taking differently: Take 5-10 mg by mouth See admin instructions. Take 10 mg by mouth at bedtime on Sun/Tues/Thurs/Fri and 5 mg on Mon/Wed/Sat 06/05/21  Yes Park Liter, MD  ?potassium chloride SA (KLOR-CON) 20 MEQ tablet Take 1 tablet (20 mEq total) by mouth daily. ?Patient not taking: Reported on 08/04/2021 02/16/21   Owens Shark, NP  ?    ? ?Allergies    ?Atorvastatin and Meperidine   ? ?Review of Systems   ?Review of Systems  ?Gastrointestinal:  Positive for abdominal pain, constipation and nausea. Negative for vomiting.  ?All other systems reviewed and are negative. ? ?Physical Exam ?Updated Vital Signs ?BP (!) 142/97 (BP Location: Left Arm)   Pulse 75   Temp 98.3 ?F (36.8 ?C) (Oral)   Resp 18   Ht '5\' 10"'$  (1.778 m)   Wt 50.3 kg   SpO2 99%   BMI 15.93 kg/m?  ?Physical Exam ?Vitals and nursing note reviewed. Exam conducted with a chaperone present.  ?Constitutional:   ?   General: She is not in acute distress. ?   Comments: Cachectic appearing  ?HENT:  ?   Head: Normocephalic and atraumatic.  ?Eyes:  ?   Conjunctiva/sclera: Conjunctivae normal.  ?Cardiovascular:  ?   Rate and Rhythm: Normal rate and regular rhythm.  ?   Heart sounds: No murmur heard. ?Pulmonary:  ?   Effort: Pulmonary effort is normal. No respiratory distress.  ?   Breath sounds: Normal breath sounds.  ?Abdominal:  ?   Palpations: Abdomen is soft.  ?   Tenderness: There is generalized abdominal tenderness and tenderness in the right upper quadrant and epigastric area. There is no guarding or rebound.  ?Genitourinary: ?   Rectum: No tenderness.  ?   Comments: Rectal vault devoid of stool, no melena or hematochezia ?Musculoskeletal:     ?   General: No swelling.  ?   Cervical back: Neck supple.  ?Skin: ?   General: Skin is warm and dry.  ?   Capillary Refill: Capillary refill takes less than 2 seconds.  ?Neurological:  ?   Mental Status: She is alert.  ?Psychiatric:     ?   Mood and Affect: Mood normal.  ? ? ?ED Results / Procedures / Treatments   ?Labs ?(all labs ordered are listed, but only abnormal results are displayed) ?Labs Reviewed  ?COMPREHENSIVE METABOLIC PANEL - Abnormal; Notable for the following components:  ?    Result Value  ? Potassium 3.1 (*)   ? Chloride 92 (*)   ? CO2 33 (*)   ? Glucose, Bld 155 (*)   ? BUN 24 (*)   ? All other components within normal limits   ?CBC - Abnormal; Notable for the following components:  ? WBC 16.2 (*)   ? RBC 5.23 (*)   ? Hemoglobin 15.3 (*)   ? HCT 46.7 (*)   ? RDW 18.4 (*)   ? All other components within normal limits  ?PROTIME-INR - Abnormal; Notable for the following components:  ? Prothrombin Time 18.2 (*)   ? INR 1.5 (*)   ? All other components within normal limits  ?LIPASE, BLOOD  ?MAGNESIUM  ?URINALYSIS, ROUTINE W REFLEX MICROSCOPIC  ?CBC  ?BASIC METABOLIC PANEL  ?PROTIME-INR  ?MAGNESIUM  ? ? ?EKG ?None ? ?Radiology ?CT Angio Chest PE W and/or Wo Contrast ? ?Result Date: 08/11/2021 ?CLINICAL DATA:  Weakness, history of metastatic colon cancer EXAM: CT ANGIOGRAPHY CHEST  WITH CONTRAST TECHNIQUE: Multidetector CT imaging of the chest was performed using the standard protocol during bolus administration of intravenous contrast. Multiplanar CT image reconstructions and MIPs were obtained to evaluate the vascular anatomy. RADIATION DOSE REDUCTION: This exam was performed according to the departmental dose-optimization program which includes automated exposure control, adjustment of the mA and/or kV according to patient size and/or use of iterative reconstruction technique. CONTRAST:  4m OMNIPAQUE IOHEXOL 350 MG/ML SOLN COMPARISON:  03/20/2020, 12/30/2020, 08/11/2021 FINDINGS: Cardiovascular: This is a technically adequate evaluation of the pulmonary vasculature. No filling defects or pulmonary emboli. The heart is unremarkable. Trace pericardial fluid. No evidence of thoracic aortic aneurysm or dissection. Mild atherosclerosis of the aorta, with moderate atherosclerosis throughout the coronary vasculature. Left chest wall port via subclavian approach tip within the superior vena cava at the atriocaval junction. Mediastinum/Nodes: No enlarged mediastinal, hilar, or axillary lymph nodes. Thyroid gland, trachea, and esophagus demonstrate no significant findings. Lungs/Pleura: Since the 12/30/2020 PET scan, multiple pulmonary nodules have  developed. Nodules are as follows: Left upper lobe, image 41/6, 7 x 6 mm. Left upper lobe, image 88/6, 7 x 7 mm. Left lower lobe, image 92/6, 3 x 3 mm. There are other stable bilateral pulmonary nodules, measuring 3

## 2021-08-11 NOTE — Telephone Encounter (Signed)
Patient call in  and stated she needs to go to the ER. Patient is weak to a point she can not walk. She also stated she is not eating or drinking. She is in pain 10 out of 10. I discuss this with Leander Rams. I advice the patient to come to be evaluation by the provider or go to the ER. Patient decided to go to [facility] ER. ?

## 2021-08-11 NOTE — Assessment & Plan Note (Signed)
Patient reports persistent constipation with last bowel movement 2 weeks ago.  She has poor appetite with minimal oral intake.  She was given Dulcolax suppository and Fleet enema in the ED but states that she is only passed flatulence.  CT shows large stool burden in the colon without obstruction. ?-Start on scheduled Senokot and MiraLAX twice daily ?-Dulcolax suppository and smog enema as needed ?-Continue IV fluid hydration overnight ?-Mobilize with PT ?

## 2021-08-11 NOTE — H&P (Signed)
?History and Physical  ? ? ?Sabrina [name] MRN:[id number] DOB: 05/22/1950 DOA: 08/11/2021 ? ?PCP: Inman, Joanna R, NP  ?Patient coming from: Home ? ?I have personally briefly reviewed patient's old medical records in [facility] ? ?Chief Complaint: Abdominal pain, constipation ? ?HPI: ?Sabrina [name] is a 70 y.o. female with medical history significant for colon cancer s/p right colectomy with hepatic and abdominal nodal metastasis, right breast cancer s/p lumpectomy adjuvant radiation and chemo with recently found new right breast mass, stable portal vein thrombosis, PAF on Coumadin, T2DM, hypothyroidism, iron deficiency anemia due to chronic blood loss who presented to the ED for evaluation of progressive abdominal pain and constipation. ? ?Patient was recently admitted 08/04/2021-08/08/2021 for intractable abdominal and back pain related to metastatic disease.  She had associated constipation requiring Dulcolax suppository and Fleet enema.  She was noted to have a new right breast mass with plans for outpatient biopsy.  She was initially treated with IV pain medications and transition to oral oxycodone 10 mg every 4 hours as needed on discharge. ? ?Patient states that she has had persistent generalized abdominal pain.  She says her last bowel movement was 2 weeks ago.  She feels generally weak.  She has low appetite and says she gets nauseous when she looks at food.  She has not had any vomiting.  She has intermittent spells of diaphoresis without fevers.  She denies any chest pain, dyspnea, diarrhea.  She says she is requiring family assistance 24/7 now as she is unable to care for herself. ? ?ED Course  Labs/Imaging on admission: I have personally reviewed following labs and imaging studies. ? ?Initial vitals showed BP 117/83, pulse 117, RR 16, temp 98.2 ?F, SPO2 94% on room air. ? ?Labs show WBC 16.2, hemoglobin 15.3, platelets 150,000, sodium 138, potassium 3.1, bicarb 33, BUN 24, creatinine 0.66, serum  glucose 155, LFTs within normal limits, INR 1.5, magnesium 2.0, lipase 28. ? ?CT abdomen/pelvis with contrast showed similar appearance of hepatic metastasis including dominant central right hepatic lobe mass and resultant partial portal vein thrombus.  Similar.  Abdominal nodal metastasis seen.  Large stool burden noted without evidence of bowel obstruction. ? ?CTA chest PE study showed new subcentimeter left upper and left lower lobe pulmonary nodules with stable right lower left lower lobe pulmonary nodules.  No evidence of PE. ? ?Patient was given IV Dilaudid 1 mg x 2, 1 L LR, IV Zofran, glycerin suppository, Fleet enema.  The hospitalist service was consulted to admit for further evaluation and management. ? ?Review of Systems: All systems reviewed and are negative except as documented in history of present illness above. ? ? ?Past Medical History:  ?Diagnosis Date  ? Atrial fibrillation (HCC) [I48.91] 11/08/2016  ? Benign essential hypertension 08/25/2015  ? Colon cancer (HCC) 03/18/2020  ? Diabetes (HCC)   ? Diabetes mellitus (HCC) 06/12/2018  ? Dyslipidemia 02/13/2015  ? Dysrhythmia   ? AFIB  ? Encounter for therapeutic drug monitoring 11/08/2016  ? Family history of breast cancer   ? Family history of leukemia   ? Family history of prostate cancer   ? Genetic testing 07/05/2018  ? Negative genetic testing on the common hereditary cancer panel.  The Hereditary Gene Panel offered by [facility] includes sequencing and/or deletion duplication testing of the following 48 genes: APC, ATM, AXIN2, BARD1, BMPR1A, BRCA1, BRCA2, BRIP1, CDH1, CDK4, CDKN2A (p14ARF), CDKN2A (p16INK4a), CHEK2, CTNNA1, DICER1, EPCAM (Deletion/duplication testing only), GREM1 (promoter region deletion/duplicat  ? Hyperlipidemia   ?   Hypothyroidism due to Hashimoto's thyroiditis 05/23/2015  ? Iron deficiency anemia due to chronic blood loss 12/26/2019  ? Long term (current) use of anticoagulants [Z79.01] 11/08/2016  ? Malignant neoplasm of upper-outer  quadrant of right breast in female, estrogen receptor positive ([place]) 06/26/2018  ? Moderate episode of recurrent major depressive disorder ([place]) 12/01/2015  ? Paroxysmal atrial fibrillation ([place]) 02/13/2015  ? Formatting of this note might be different from the original. Chads score 1, chads vas 2, anticoagulated with warfarin  ? Personal history of chemotherapy   ? Personal history of radiation therapy   ? Port-A-Cath in place 08/24/2018  ? Preop cardiovascular exam 07/05/2018  ? Negative genetic testing on the common hereditary cancer panel.  The Hereditary Gene Panel offered by [facility] includes sequencing and/or deletion duplication testing of the following 48 genes: APC, ATM, AXIN2, BARD1, BMPR1A, BRCA1, BRCA2, BRIP1, CDH1, CDK4, CDKN2A (p14ARF), CDKN2A (p16INK4a), CHEK2, CTNNA1, DICER1, EPCAM (Deletion/duplication testing only), GREM1 (promoter region deletion/duplicat  ? Smoking 07/12/2017  ? Statin intolerance 12/08/2016  ? Type 2 diabetes mellitus without complication, without long-term current use of insulin ([place]) 05/23/2015  ? ? ?Past Surgical History:  ?Procedure Laterality Date  ? ABDOMINAL HYSTERECTOMY    ? APPENDECTOMY    ? BREAST LUMPECTOMY Right 07/13/2018  ? BREAST LUMPECTOMY WITH RADIOACTIVE SEED AND SENTINEL LYMPH NODE BIOPSY Right 07/13/2018  ? Procedure: RIGHT BREAST LUMPECTOMY WITH RADIOACTIVE SEED AND RIGHT SENTINEL LYMPH NODE MAPPING;  Surgeon: Erroll Luna, MD;  Location: [facility];  Service: General;  Laterality: Right;  ? BREAST SURGERY    ? CHOLECYSTECTOMY    ? KNEE SURGERY    ? PORT-A-CATH REMOVAL N/A 08/21/2019  ? Procedure: REMOVAL PORT-A-CATH;  Surgeon: Erroll Luna, MD;  Location: [facility];  Service: General;  Laterality: N/A;  ? PORTACATH PLACEMENT Right 08/02/2018  ? Procedure: INSERTION PORT-A-CATH WITH ULTRASOUND;  Surgeon: Erroll Luna, MD;  Location: [facility];  Service: General;  Laterality: Right;  ? ? ?Social History: ? reports  that she has been smoking cigarettes. She has a 10.00 pack-year smoking history. She has never used smokeless tobacco. She reports that she does not drink alcohol and does not use drugs. ? ?Allergies  ?Allergen Reactions  ? Atorvastatin Other (See Comments)  ?  Muscle aches  ? Meperidine Nausea And Vomiting  ? ? ?Family History  ?Problem Relation Age of Onset  ? Prostate cancer Father   ? Cancer Brother   ? Acute myelogenous leukemia Brother   ? Alcohol abuse Maternal Aunt   ? Hypertension Maternal Aunt   ? Breast cancer Maternal Aunt   ?     dx over 29  ? Alcohol abuse Paternal Uncle   ? Diabetes Paternal Uncle   ? Hypertension Maternal Grandmother   ? Breast cancer Mother 52  ?     Deceased at 2  ? Cancer Maternal Uncle   ? Diabetes Maternal Uncle   ? Alcohol abuse Maternal Uncle   ? Diabetes Son   ? Breast cancer Cousin   ?     dx over 64  ? Breast cancer Cousin 49  ? ? ? ?Prior to Admission medications   ?Medication Sig Start Date End Date Taking? Authorizing Provider  ?anastrozole (ARIMIDEX) 1 MG tablet TAKE 1 TABLET DAILY ?Patient taking differently: Take 1 mg by mouth daily. 03/31/20  Yes Nicholas Lose, MD  ?flecainide (TAMBOCOR) 100 MG tablet Take 1 tablet (100 mg total) by mouth 2 (two) times daily. ?  Patient taking differently: Take 100 mg by mouth at bedtime. 02/19/21  Yes Park Liter, MD  ?HYDROcodone-acetaminophen (NORCO/VICODIN) 5-325 MG tablet Take 1 tablet by mouth every 6 (six) hours as needed for moderate pain. 06/30/21  Yes Ladell Pier, MD  ?levothyroxine (SYNTHROID, LEVOTHROID) 100 MCG tablet Take 100 mcg by mouth at bedtime.   Yes [provider]  ?lidocaine-prilocaine (EMLA) cream Apply 1 application. topically as needed. ?Patient taking differently: Apply 1 application. topically as needed (for port access). 06/30/21  Yes Ladell Pier, MD  ?Melatonin 3 MG CAPS Take 9 mg by mouth at bedtime as needed (for sleep).   Yes [provider]  ?metFORMIN (GLUCOPHAGE)  500 MG tablet Take 500 mg by mouth 2 (two) times daily with a meal. 11/29/16  Yes [provider]  ?oxyCODONE (ROXICODONE) 5 MG immediate release tablet Take 1 tablet (5 mg total) by mouth every 4 (four)

## 2021-08-11 NOTE — Assessment & Plan Note (Signed)
Likely secondary to cancer plus some element of hemoconcentration.  No sign of active infection at this time. ?

## 2021-08-11 NOTE — Assessment & Plan Note (Signed)
Continue Synthroid °

## 2021-08-11 NOTE — ED Provider Notes (Signed)
Patient signed out to me by previous provider. Please refer to their note for full HPI.  Briefly this is a 71 year old chronic cancer patient who presents emergency department abdominal pain, constipation and episode of hypoxia.  CT of the abdomen pelvis shows large fecal load.  She is been given glycerin suppositories and enema here without relief.  She is currently requiring 2 L of nasal cannula which is a new requirement for her.  We are pending CT PE to rule out acute finding but otherwise plan for medical admission for bowel regimen, pain control and hypoxia. ?Physical Exam  ?BP (!) 159/98   Pulse 90   Temp 98.2 ?F (36.8 ?C) (Oral)   Resp (!) 23   Ht '5\' 10"'$  (1.778 m)   Wt 50.3 kg   SpO2 98%   BMI 15.93 kg/m?  ? ?Physical Exam ?Vitals and nursing note reviewed.  ?Constitutional:   ?   General: She is not in acute distress. ?   Appearance: Normal appearance.  ?HENT:  ?   Head: Normocephalic.  ?   Mouth/Throat:  ?   Mouth: Mucous membranes are moist.  ?Cardiovascular:  ?   Rate and Rhythm: Normal rate.  ?Pulmonary:  ?   Effort: Pulmonary effort is normal. No respiratory distress.  ?Abdominal:  ?   Palpations: Abdomen is soft.  ?   Tenderness: There is generalized abdominal tenderness. There is no rebound.  ?Skin: ?   General: Skin is warm.  ?Neurological:  ?   Mental Status: She is alert and oriented to person, place, and time. Mental status is at baseline.  ?Psychiatric:     ?   Mood and Affect: Mood normal.  ? ? ?Procedures  ?Procedures ? ?ED Course / MDM  ?  ?Medical Decision Making ?Amount and/or Complexity of Data Reviewed ?Labs: ordered. ?Radiology: ordered. ? ?Risk ?OTC drugs. ?Prescription drug management. ?Decision regarding hospitalization. ? ? ?CT PE study shows chronic disease but no acute pulmonary embolus or other findings.  Plan for medical admission, pain control, bowel regimen.  Patients evaluation and results requires admission for further treatment and care.  Spoke with hospitalist,  reviewed patient's ED course and they accept admission.  Patient agrees with admission plan, offers no new complaints and is stable/unchanged at time of admit. ? ? ? ? ?  ?Lorelle Gibbs, DO ?08/11/21 2042 ? ?

## 2021-08-11 NOTE — ED Provider Triage Note (Signed)
Emergency Medicine Provider Triage Evaluation Note ? ?Sabrina [name] , a 71 y.o. female  was evaluated in triage.  Pt complains of 71 year old female presents emergency department with complaints of abdominal pain and constipation and generalized weakness.  Daughter is primary historian, and the patient lives with daughter..  She noticed that her mom had not been eating 2 weeks ago.  This prompted a hospital visit after her oncologist was concerned with dehydration, anorexia/weight loss, constipation on 08/04/2021.  She was admitted on the same day and stayed in the hospital until 08/08/2021.  Upon discharge, she had had 2 bowel movements with enemas and was tolerating solid foods; her weakness had improved significantly.  Upon discharge, she was instructed to not continue her prescribed bowel regimen of MiraLAX, sorbitol, Doculax, and has not had a bowel movement since.  The pain has increased in severity along with increase in weakness and no bowel movement since discharge.  Daughter communicated with an individual at the patient's oncologist, and the usual recommended coming to the ER for further evaluation and treatment.  Denies fever, chills, night sweats, chest pain, shortness of breath, vomiting, nausea. ? ?Review of Systems  ?Positive: Abdominal pain, weakness, constipation ?Negative: Fever, chills, night sweats, chest pain, shortness of breath, vomiting, nausea ? ?Physical Exam  ?BP 117/83 (BP Location: Left Arm)   Pulse (!) 117   Temp 98.2 ?F (36.8 ?C) (Oral)   Resp 16   Ht '5\' 10"'$  (1.778 m)   Wt 50.3 kg   SpO2 94%   BMI 15.93 kg/m?  ?Gen:   Awake, no distress   ?Resp:  Normal effort  ?MSK:   Moves extremities without difficulty  ?Other:  generalized abdominal tenderness. ? ?Medical Decision Making  ?Medically screening exam initiated at 11:20 AM.  Appropriate orders placed.  Sabrina [name] was informed that the remainder of the evaluation will be completed by another provider, this initial triage  assessment does not replace that evaluation, and the importance of remaining in the ED until their evaluation is complete. ? ?  ?Wilnette Kales, [place] ?08/11/21 1122 ? ?

## 2021-08-11 NOTE — Assessment & Plan Note (Signed)
Continue metformin.

## 2021-08-11 NOTE — Assessment & Plan Note (Signed)
Reports persistent abdominal pain with relief after IV medications in the ED. ?-Continue oxycodone 10 mg every 4 hours as needed with hold parameters ?-IV Dilaudid 1 mg every 3 hours as needed for severe pain with hold parameters ?-Start on bowel regimen as above ?

## 2021-08-11 NOTE — Plan of Care (Signed)

## 2021-08-11 NOTE — Assessment & Plan Note (Signed)
Hemoglobin stable 

## 2021-08-11 NOTE — Assessment & Plan Note (Signed)
Rate currently controlled.  INR subtherapeutic. ?-Continue Coumadin per pharmacy ?-Continue flecainide ?

## 2021-08-11 NOTE — Progress Notes (Signed)
ANTICOAGULATION CONSULT NOTE - Initial Consult ? ?Pharmacy Consult for Warfarin ?Indication: atrial fibrillation ? ?Allergies  ?Allergen Reactions  ? Atorvastatin Other (See Comments)  ?  Muscle aches  ? Meperidine Nausea And Vomiting  ? ? ?Patient Measurements: ?Height: 5' 10" (177.8 cm) ?Weight: 50.3 kg (111 lb) ?IBW/kg (Calculated) : 68.5 ?Heparin Dosing Weight:  ? ?Vital Signs: ?Temp: 98.2 ?F (36.8 ?C) (05/02 1039) ?Temp Source: Oral (05/02 1039) ?BP: 159/98 (05/02 1857) ?Pulse Rate: 90 (05/02 1857) ? ?Labs: ?Recent Labs  ?  08/11/21 ?1211  ?HGB 15.3*  ?HCT 46.7*  ?PLT 150  ?LABPROT 18.2*  ?INR 1.5*  ?CREATININE 0.66  ? ? ?Estimated Creatinine Clearance: 52 mL/min (by C-G formula based on SCr of 0.66 mg/dL). ? ? ?Medical History: ?Past Medical History:  ?Diagnosis Date  ? Atrial fibrillation ([place]) [I48.91] 11/08/2016  ? Benign essential hypertension 08/25/2015  ? Colon cancer ([place]) 03/18/2020  ? Diabetes ([place])   ? Diabetes mellitus ([place]) 06/12/2018  ? Dyslipidemia 02/13/2015  ? Dysrhythmia   ? AFIB  ? Encounter for therapeutic drug monitoring 11/08/2016  ? Family history of breast cancer   ? Family history of leukemia   ? Family history of prostate cancer   ? Genetic testing 07/05/2018  ? Negative genetic testing on the common hereditary cancer panel.  The Hereditary Gene Panel offered by [facility] includes sequencing and/or deletion duplication testing of the following 48 genes: APC, ATM, AXIN2, BARD1, BMPR1A, BRCA1, BRCA2, BRIP1, CDH1, CDK4, CDKN2A (p14ARF), CDKN2A (p16INK4a), CHEK2, CTNNA1, DICER1, EPCAM (Deletion/duplication testing only), GREM1 (promoter region deletion/duplicat  ? Hyperlipidemia   ? Hypothyroidism due to Hashimoto's thyroiditis 05/23/2015  ? Iron deficiency anemia due to chronic blood loss 12/26/2019  ? Long term (current) use of anticoagulants [Z79.01] 11/08/2016  ? Malignant neoplasm of upper-outer quadrant of right breast in female, estrogen receptor positive ([place]) 06/26/2018  ? Moderate episode of  recurrent major depressive disorder ([place]) 12/01/2015  ? Paroxysmal atrial fibrillation ([place]) 02/13/2015  ? Formatting of this note might be different from the original. Chads score 1, chads vas 2, anticoagulated with warfarin  ? Personal history of chemotherapy   ? Personal history of radiation therapy   ? Port-A-Cath in place 08/24/2018  ? Preop cardiovascular exam 07/05/2018  ? Negative genetic testing on the common hereditary cancer panel.  The Hereditary Gene Panel offered by [facility] includes sequencing and/or deletion duplication testing of the following 48 genes: APC, ATM, AXIN2, BARD1, BMPR1A, BRCA1, BRCA2, BRIP1, CDH1, CDK4, CDKN2A (p14ARF), CDKN2A (p16INK4a), CHEK2, CTNNA1, DICER1, EPCAM (Deletion/duplication testing only), GREM1 (promoter region deletion/duplicat  ? Smoking 07/12/2017  ? Statin intolerance 12/08/2016  ? Type 2 diabetes mellitus without complication, without long-term current use of insulin ([place]) 05/23/2015  ? ? ?Medications:  ?Scheduled:  ? [START ON 08/12/2021] anastrozole  1 mg Oral Daily  ? flecainide  100 mg Oral QHS  ? levothyroxine  100 mcg Oral QHS  ? [START ON 08/12/2021] metFORMIN  500 mg Oral BID WC  ? polyethylene glycol  17 g Oral BID  ? potassium chloride  40 mEq Oral Once  ? senna-docusate  1 tablet Oral BID  ? sodium chloride flush  3 mL Intravenous Q12H  ? ?Infusions:  ? 0.9 % NaCl with KCl 40 mEq / L    ? ? ?Assessment: ?76 yoF admitted on 5/2 for abdominal pain, constipation, metastatic colon cancer.  Pharmacy is consulted to dose warfarin.  She is on PTA Warfarin for history of Afib.  CT abdomen shows  hepatic metastasis and resultant partial portal vein thrombus. CTa negative for PE.  ? ?PTA Warfarin 60m daily except 523mon Mon/Wed/Saturday.  LD on 5/1.   ?INR 1.5, subtherapeutic on admission ?- During previous admission, INR decreased from supratherapeutic 3.3 down to therapeutic range after holding x2 days then lower doses 57m73m.57mg  ?CBC: Hgb 15.3, Plt 150 ?No bleeding or  complications reported ?LFTs: WNL, Tbili 1.1 ?No major drug-drug interactions noted ?Diet: regular ? ?Goal of Therapy:  ?INR 2-3 ?Monitor platelets by anticoagulation protocol: Yes ?  ?Plan:  ?Warfarin 10 mg PO x 1. ?Daily PT/INR. ?Monitor for signs and symptoms of bleeding. ? ? ?ChrGretta ArabarmD, BCPS ?Clinical Pharmacist ?[facility] [phone]/05/2021 8:38 PM ?

## 2021-08-11 NOTE — Assessment & Plan Note (Signed)
Follows with oncology, Dr. Benay Spice.  Previously on FOLFIRI which has been discontinued with plan to start on pembrolizumab. ?

## 2021-08-11 NOTE — Hospital Course (Signed)
Sabrina [name] is a 71 y.o. female with medical history significant for colon cancer s/p right colectomy with hepatic and abdominal nodal metastasis, right breast cancer s/p lumpectomy adjuvant radiation and chemo with recently found new right breast mass, stable portal vein thrombosis, PAF on Coumadin, T2DM, hypothyroidism, iron deficiency anemia due to chronic blood loss who is admitted with constipation, cancer associated pain, failure to thrive. ?

## 2021-08-11 NOTE — Assessment & Plan Note (Addendum)
Generalized weakness ?Patient with progressive functional decline with anorexia, weight loss, cancer associated pain, generalized weakness. ?-Continue pain control as above ?-Consult nutrition ?-PT/OT eval ?-May benefit from palliative care ?

## 2021-08-11 NOTE — ED Triage Notes (Signed)
Patient states she has metastatic colon cancer. ?Patient also reports no BM in 2 weeks. Patient was recently hospitalized. ?Patient's daughter reports that the patient is too weak to eat. ?

## 2021-08-12 ENCOUNTER — Inpatient Hospital Stay: Payer: Medicare Other

## 2021-08-12 DIAGNOSIS — E785 Hyperlipidemia, unspecified: Secondary | ICD-10-CM | POA: Diagnosis present

## 2021-08-12 DIAGNOSIS — Z66 Do not resuscitate: Secondary | ICD-10-CM | POA: Diagnosis present

## 2021-08-12 DIAGNOSIS — C189 Malignant neoplasm of colon, unspecified: Secondary | ICD-10-CM | POA: Diagnosis not present

## 2021-08-12 DIAGNOSIS — C787 Secondary malignant neoplasm of liver and intrahepatic bile duct: Secondary | ICD-10-CM

## 2021-08-12 DIAGNOSIS — I81 Portal vein thrombosis: Secondary | ICD-10-CM | POA: Diagnosis present

## 2021-08-12 DIAGNOSIS — F32A Depression, unspecified: Secondary | ICD-10-CM | POA: Diagnosis present

## 2021-08-12 DIAGNOSIS — I10 Essential (primary) hypertension: Secondary | ICD-10-CM | POA: Diagnosis present

## 2021-08-12 DIAGNOSIS — R531 Weakness: Secondary | ICD-10-CM | POA: Diagnosis not present

## 2021-08-12 DIAGNOSIS — R64 Cachexia: Secondary | ICD-10-CM | POA: Diagnosis present

## 2021-08-12 DIAGNOSIS — E873 Alkalosis: Secondary | ICD-10-CM | POA: Diagnosis not present

## 2021-08-12 DIAGNOSIS — D500 Iron deficiency anemia secondary to blood loss (chronic): Secondary | ICD-10-CM | POA: Diagnosis present

## 2021-08-12 DIAGNOSIS — E43 Unspecified severe protein-calorie malnutrition: Secondary | ICD-10-CM | POA: Diagnosis present

## 2021-08-12 DIAGNOSIS — F17210 Nicotine dependence, cigarettes, uncomplicated: Secondary | ICD-10-CM | POA: Diagnosis present

## 2021-08-12 DIAGNOSIS — R1011 Right upper quadrant pain: Secondary | ICD-10-CM | POA: Diagnosis present

## 2021-08-12 DIAGNOSIS — Z681 Body mass index (BMI) 19 or less, adult: Secondary | ICD-10-CM | POA: Diagnosis not present

## 2021-08-12 DIAGNOSIS — R791 Abnormal coagulation profile: Secondary | ICD-10-CM | POA: Diagnosis not present

## 2021-08-12 DIAGNOSIS — E063 Autoimmune thyroiditis: Secondary | ICD-10-CM | POA: Diagnosis present

## 2021-08-12 DIAGNOSIS — K5909 Other constipation: Secondary | ICD-10-CM | POA: Diagnosis present

## 2021-08-12 DIAGNOSIS — R627 Adult failure to thrive: Secondary | ICD-10-CM | POA: Diagnosis present

## 2021-08-12 DIAGNOSIS — D638 Anemia in other chronic diseases classified elsewhere: Secondary | ICD-10-CM | POA: Diagnosis present

## 2021-08-12 DIAGNOSIS — C7989 Secondary malignant neoplasm of other specified sites: Secondary | ICD-10-CM | POA: Diagnosis present

## 2021-08-12 DIAGNOSIS — Z1501 Genetic susceptibility to malignant neoplasm of breast: Secondary | ICD-10-CM | POA: Diagnosis not present

## 2021-08-12 DIAGNOSIS — C772 Secondary and unspecified malignant neoplasm of intra-abdominal lymph nodes: Secondary | ICD-10-CM | POA: Diagnosis present

## 2021-08-12 DIAGNOSIS — I480 Paroxysmal atrial fibrillation: Secondary | ICD-10-CM | POA: Diagnosis present

## 2021-08-12 DIAGNOSIS — E119 Type 2 diabetes mellitus without complications: Secondary | ICD-10-CM | POA: Diagnosis present

## 2021-08-12 DIAGNOSIS — K5900 Constipation, unspecified: Secondary | ICD-10-CM | POA: Diagnosis not present

## 2021-08-12 DIAGNOSIS — E876 Hypokalemia: Secondary | ICD-10-CM | POA: Diagnosis present

## 2021-08-12 DIAGNOSIS — G893 Neoplasm related pain (acute) (chronic): Secondary | ICD-10-CM | POA: Diagnosis not present

## 2021-08-12 DIAGNOSIS — I472 Ventricular tachycardia, unspecified: Secondary | ICD-10-CM | POA: Diagnosis not present

## 2021-08-12 LAB — CBC
HCT: 40.1 % (ref 36.0–46.0)
Hemoglobin: 12.6 g/dL (ref 12.0–15.0)
MCH: 28.3 pg (ref 26.0–34.0)
MCHC: 31.4 g/dL (ref 30.0–36.0)
MCV: 90.1 fL (ref 80.0–100.0)
Platelets: 121 10*3/uL — ABNORMAL LOW (ref 150–400)
RBC: 4.45 MIL/uL (ref 3.87–5.11)
RDW: 17.7 % — ABNORMAL HIGH (ref 11.5–15.5)
WBC: 14.1 10*3/uL — ABNORMAL HIGH (ref 4.0–10.5)
nRBC: 0 % (ref 0.0–0.2)

## 2021-08-12 LAB — BASIC METABOLIC PANEL
Anion gap: 8 (ref 5–15)
BUN: 22 mg/dL (ref 8–23)
CO2: 34 mmol/L — ABNORMAL HIGH (ref 22–32)
Calcium: 8.9 mg/dL (ref 8.9–10.3)
Chloride: 97 mmol/L — ABNORMAL LOW (ref 98–111)
Creatinine, Ser: 0.39 mg/dL — ABNORMAL LOW (ref 0.44–1.00)
GFR, Estimated: >60 mL/min (ref >60)
Glucose, Bld: 120 mg/dL — ABNORMAL HIGH (ref 70–99)
Potassium: 3.7 mmol/L (ref 3.5–5.1)
Sodium: 139 mmol/L (ref 135–145)

## 2021-08-12 LAB — PROTIME-INR
INR: 1.5 — ABNORMAL HIGH (ref 0.8–1.2)
Prothrombin Time: 18.3 seconds — ABNORMAL HIGH (ref 11.4–15.2)

## 2021-08-12 LAB — URINALYSIS, ROUTINE W REFLEX MICROSCOPIC
Bilirubin Urine: NEGATIVE
Glucose, UA: NEGATIVE mg/dL
Hgb urine dipstick: NEGATIVE
Ketones, ur: 20 mg/dL — AB
Leukocytes,Ua: NEGATIVE
Nitrite: NEGATIVE
Protein, ur: 30 mg/dL — AB
Specific Gravity, Urine: 1.04 — ABNORMAL HIGH (ref 1.005–1.030)
pH: 6 (ref 5.0–8.0)

## 2021-08-12 LAB — MAGNESIUM: Magnesium: 2.1 mg/dL (ref 1.7–2.4)

## 2021-08-12 MED ORDER — BISACODYL 5 MG PO TBEC
10.0000 mg | DELAYED_RELEASE_TABLET | Freq: Every day | ORAL | Status: DC
  Administered 2021-08-12 – 2021-08-14 (×3): 10 mg via ORAL
  Filled 2021-08-12 (×3): qty 2

## 2021-08-12 MED ORDER — WARFARIN SODIUM 5 MG PO TABS
10.0000 mg | ORAL_TABLET | Freq: Once | ORAL | Status: AC
  Administered 2021-08-12: 10 mg via ORAL
  Filled 2021-08-12: qty 2

## 2021-08-12 MED ORDER — SODIUM CHLORIDE 0.9% FLUSH: 10.0000 mL | INTRAVENOUS | Status: DC | PRN

## 2021-08-12 MED ORDER — OXYCODONE HCL 5 MG PO TABS
5.0000 mg | ORAL_TABLET | ORAL | Status: DC | PRN
  Administered 2021-08-12 – 2021-08-15 (×12): 10 mg via ORAL
  Administered 2021-08-16: 5 mg via ORAL
  Administered 2021-08-17: 10 mg via ORAL
  Filled 2021-08-12: qty 2
  Filled 2021-08-12: qty 1
  Filled 2021-08-12 (×13): qty 2

## 2021-08-12 MED ORDER — BOOST / RESOURCE BREEZE PO LIQD CUSTOM
1.0000 | Freq: Three times a day (TID) | ORAL | Status: DC
  Administered 2021-08-13 – 2021-08-16 (×5): 1 via ORAL

## 2021-08-12 MED ORDER — SODIUM CHLORIDE 0.9 % IV SOLN: INTRAVENOUS | Status: DC

## 2021-08-12 MED ORDER — ADULT MULTIVITAMIN W/MINERALS CH
1.0000 | ORAL_TABLET | Freq: Every day | ORAL | Status: DC
  Administered 2021-08-13 – 2021-08-17 (×5): 1 via ORAL
  Filled 2021-08-12 (×5): qty 1

## 2021-08-12 MED ORDER — SODIUM CHLORIDE 0.9% FLUSH
10.0000 mL | Freq: Two times a day (BID) | INTRAVENOUS | Status: DC
  Administered 2021-08-12 – 2021-08-15 (×8): 10 mL

## 2021-08-12 MED ORDER — CHLORHEXIDINE GLUCONATE CLOTH 2 % EX PADS
6.0000 | MEDICATED_PAD | Freq: Every day | CUTANEOUS | Status: DC
  Administered 2021-08-12 – 2021-08-16 (×5): 6 via TOPICAL

## 2021-08-12 MED ORDER — FLEET ENEMA 7-19 GM/118ML RE ENEM
1.0000 | ENEMA | Freq: Once | RECTAL | Status: AC
  Administered 2021-08-12: 1 via RECTAL
  Filled 2021-08-12: qty 1

## 2021-08-12 NOTE — Progress Notes (Signed)
?PROGRESS NOTE ? ?Sabrina [name]  ?DOB: January 30, 1951  ?PCP: Sabrina Chapel, NP ?WER:[id number]  ?DOA: 08/11/2021 ? LOS: 0 days  ?Hospital Day: 2 ? ?Brief narrative: ?Sabrina [name] is a 71 y.o. female with PMH significant for DM2, HTN, HLD, A-fib on Coumadin, hypothyroidism, depression, chronic blood loss/iron deficiency anemia, Colon cancer s/p right colectomy with hepatic and abdominal nodal metastasis, right breast cancer status postlumpectomy, adjuvant radiation and chemo with recently found new right breast mass, stable portal vein thrombosis  ?Recently hospitalized 4/25 to 4/29 for intractable abdomen pain and back pain related to metastatic disease.  She was treated with IV pain medicines and was transitioned to oral oxycodone at discharge.  She also had associated constipation requiring Dulcolax suppository and Fleet enema.  In that admission, she was noted to have a new right breast mass and plan for outpatient biopsy. ? ?Patient returned to the ED on 5/2 with complaint of persistent generalized abdominal pain. ?Last BM was 2 weeks ago, has associated nausea, anorexia.  No vomiting ? ?In the ED, patient was afebrile, tachycardic to 117 ?Labs with WC count elevated to 16.2, potassium low at 3.1, BUN/creatinine elevated at 24/0.66 ?CT abdomen/pelvis with contrast showed large stool burden without evidence of bowel obstruction.  It also showed known intra-abdominal metastasis and portal vein thrombosis as seen previous hospitalization.   ?CT PA was also done which did not show any evidence of pulm embolism but showed a new subcentimeter left upper and left lower lobe pulmonary nodules with stable right lower left lower lobe pulmonary nodules.   ?  ?Patient was given IV Dilaudid 1 mg x 2, 1 L LR, IV Zofran, glycerin suppository, Fleet enema.   ?Admitted to hospitalist service. ? ?Subjective: ?Patient was seen and examined this morning.  Pleasant elderly Caucasian female.  Propped up in bed.  Seems weak, depressed.   No bowel movement in 2 weeks. ?Received MiraLAX and Dulcolax this morning.  RN was about to give enema.   ?Daughter at bedside.    ? ?Principal Problem: ?  Constipation ?Active Problems: ?  Cancer associated pain ?  Metastatic colon cancer to liver [facility]) ?  Failure to thrive in adult ?  Paroxysmal atrial fibrillation (HCC) ?  Type 2 diabetes mellitus without complication, without long-term current use of insulin ([place]) ?  Leukocytosis ?  Hypothyroidism due to Hashimoto's thyroiditis ?  Iron deficiency anemia due to chronic blood loss ?  Generalized weakness ?  ? 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  ?-Start aggressive bowel regimen with scheduled Senokot, MiraLAX, oral Dulcolax and as needed enema.  Give 2 doses of enema today for acute relief.   ?-If no success, patient may need Relistor.   ? ?Metastatic colon cancer to liver   ?Cancer associated pain ?-Currently on oxycodone 10 mg every 4 hours as needed, Dilaudid as needed. ?-Follows up with oncology Dr. Benay Spice. ? ?Failure to thrive in adult ?Generalized weakness ?-Patient with progressive functional decline with anorexia, weight loss, cancer associated pain, generalized weakness. ?-Consult nutrition ?-PT/OT eval ? ?Paroxysmal atrial fibrillation (HCC) ?-Continue flecainide and Coumadin.   ?-Coumadin dosing per pharmacy.   ?Recent Labs  ?Lab 08/06/21 ?1950 08/07/21 ?9326 08/08/21 ?7124 08/11/21 ?1211 08/12/21 ?5809  ?INR 2.8* 2.9* 2.5* 1.5* 1.5*  ? ?Type 2 diabetes mellitus ?-  Keep metformin on hold. ?-Sliding scale insulin with Accu-Cheks ?Recent Labs  ?Lab 08/07/21 ?1215 08/07/21 ?1651 08/07/21 ?2201 08/08/21 ?0730 08/08/21 ?1206  ?GLUCAP 161* 116* 123* 110* 131*  ? ?Iron deficiency anemia due to chronic blood loss ?-Hemoglobin  stable. ?Recent Labs  ?  08/04/21 ?1015 08/05/21 ?0454 08/07/21 ?1610 08/11/21 ?1211 08/12/21 ?9604  ?HGB 16.0* 13.9 13.9 15.3* 12.6  ?MCV 89.0 89.3 90.4 89.3 90.1  ? ?Hypothyroidism due to Hashimoto's thyroiditis ?-Continue Synthroid. ? ?Goals of care ?  Code Status: DNR  ? ? ?Mobility: Encourage ambulation.  Pending PT eval ?Skin assessment:  ?  ? ?Nutritional status:  ?Body mass index is 15.93 kg/m?Marland Kitchen  ?Nutrition Problem: Severe Malnutrition ?Etiology: chronic illness, cancer and cancer related treatments ?Signs/Symptoms: severe fat depletion, severe muscle depletion, percent weight loss ? ? ? ? ?Diet:  ?Diet Order   ? ?       ?  Diet regular Room service appropriate? Yes; Fluid consistency: Thin  Diet effective now       ?  ? ?  ?  ? ?  ? ? ?DVT prophylaxis:  ? ?warfarin (COUMADIN) tablet 10 mg   ?Antimicrobials: None ?Fluid: NS at 75 mill per hour ?Consultants: None ?Family Communication: Daughter Sabrina [name] at bedside ? ?Status is: Observation ? ?Continue in-hospital care because: Need to have BM, good pain control, PT eval ?Level of care: Telemetry  ? ?Dispo: The patient is from: Home ?             Anticipated d/c is to: Pending clinical course, pending PT eval ?             Patient currently is not medically stable to d/c. ?  Difficult to place patient No ? ? ? ? ?Infusions:  ? sodium chloride    ? ? ?Scheduled Meds: ? anastrozole  1 mg Oral Daily  ? bisacodyl  10 mg Oral Daily  ? Chlorhexidine Gluconate Cloth  6 each Topical Daily  ? feeding supplement  1 Container Oral TID BM  ? flecainide  100 mg Oral QHS  ? levothyroxine  100 mcg Oral QHS  ? [START ON 08/13/2021] multivitamin with minerals  1 tablet Oral Daily  ? polyethylene glycol  17 g Oral BID  ? senna-docusate  1 tablet Oral BID  ? sodium chloride flush  10-40 mL Intracatheter Q12H  ? sodium chloride flush  3 mL Intravenous Q12H  ? warfarin  10 mg Oral ONCE-1600  ? Warfarin - Pharmacist Dosing Inpatient   Does not apply q1600  ? ? ?PRN meds: ?acetaminophen  **OR** acetaminophen, bisacodyl, HYDROmorphone (DILAUDID) injection, melatonin, ondansetron **OR** ondansetron (ZOFRAN) IV, oxyCODONE, pantoprazole, sodium chloride flush, sorbitol, milk of mag, mineral oil, glycerin (SMOG) enema  ? ?Antimicrobials: ?Anti-infectives (From admission, onward)  ? ? None  ? ?  ? ? ?Objective: ?Vitals:  ? 08/12/21 0408 08/12/21 0911  ?BP: (!) 152/85 (!) 162/87  ?Pulse: 74 77  ?Resp: 14 18  ?Temp: 97.7 ?F (36.5 ?C) 98 ?F (36.7 ?C)  ?SpO2: 96% 91%  ? ?No intake or output data in the 24 hours ending 08/12/21 1328 ?Filed Weights  ? 08/11/21 1042  ?Weight: 50.3 kg  ? ?Weight change:  ?Body mass index is 15.93 kg/m?.  ? ?Physical Exam: ?General exam: Pleasant, elderly Caucasian female.  Not in physical distress, weak ?Skin: No rashes, lesions or ulcers. ?HEENT: Atraumatic, normocephalic, no obvious bleeding ?Lungs: Clear to auscultation bilaterally ?CVS: Regular rate and rhythm, no murmur ?GI/Abd soft, mild  diffuse tenderness, nondistended, bowel sound present ?CNS: Alert, awake, oriented x3 ?Psychiatry: Depressed look ?Extremities: No pedal edema, no calf tenderness ? ?Data Review: I have personally reviewed the laboratory data and studies available. ? ?F/u labs  ?Unresulted Labs (From admission, onward)  ? ?  Start     Ordered  ? 08/13/21 0500  CBC with Differential/Platelet  Daily,   R     ? 08/12/21 1328  ? 08/13/21 0321  Basic metabolic panel  Daily,   R     ? 08/12/21 1328  ? ?  ?  ? ?  ? ? ?Signed, ?Terrilee Croak, MD ?[facility] ?08/12/2021 ? ? ? ? ? ? ? ? ? ? ? ? ?

## 2021-08-12 NOTE — Progress Notes (Signed)
Initial Nutrition Assessment ? ?DOCUMENTATION CODES:  ? ?Severe malnutrition in context of chronic illness, Underweight ? ?INTERVENTION:  ? ?-Boost Breeze po TID, each supplement provides 250 kcal and 9 grams of protein ? ?-Multivitamin with minerals daily ? ?NUTRITION DIAGNOSIS:  ? ?Severe Malnutrition related to chronic illness, cancer and cancer related treatments as evidenced by severe fat depletion, severe muscle depletion, percent weight loss. ? ?GOAL:  ? ?Patient will meet greater than or equal to 90% of their needs ? ?MONITOR:  ? ?PO intake, Supplement acceptance, Labs, Weight trends, I & O's ? ?REASON FOR ASSESSMENT:  ? ?Consult ?Assessment of nutrition requirement/status ? ?ASSESSMENT:  ? ?71 y.o. female with medical history significant for colon cancer s/p right colectomy with hepatic and abdominal nodal metastasis, right breast cancer s/p lumpectomy adjuvant radiation and chemo with recently found new right breast mass, stable portal vein thrombosis, PAF on Coumadin, T2DM, hypothyroidism, iron deficiency anemia due to chronic blood loss who presented to the ED for evaluation of progressive abdominal pain and constipation. ? ?Patient in room sleeping, daughter at bedside. Pt recently discharged from [facility] on 4/29. Pt's daughter states pt was living with her from the day she discharged until she came back to the hospital. States pt was only consuming ice chips and 2-3 Outshine popsicles during this time. States pt was having trouble drinking Ensure Max during previous admission despite liking Premier Protein in the past. ? ?No BM for 2 weeks. Pt had an enema this morning, no results yet.  ? ?Per weight records, pt has lost 27 lbs since 1/17 (19% wt loss  x 3.5 months, significant of time frame).  ? ?Medications: Dulcolax, Miralax, Senokot, FLEET enema ? ?Labs reviewed: ? CBGs: 131 ? ?NUTRITION - FOCUSED PHYSICAL EXAM: ? ?Flowsheet Row Most Recent Value  ?Orbital Region Mild depletion  ?Upper Arm Region Severe  depletion  ?Thoracic and Lumbar Region Severe depletion  ?Buccal Region Severe depletion  ?Temple Region Severe depletion  ?Clavicle Bone Region Severe depletion  ?Clavicle and Acromion Bone Region Severe depletion  ?Scapular Bone Region Severe depletion  ?Dorsal Hand Severe depletion  ?Patellar Region Severe depletion  ?Anterior Thigh Region Severe depletion  ?Posterior Calf Region Severe depletion  ?Edema (RD Assessment) None  ?Hair Reviewed  ?Eyes Unable to assess  ?Mouth Reviewed  ?Skin Reviewed  ?Nails Reviewed  ? ?  ? ? ?Diet Order:   ?Diet Order   ? ?       ?  Diet regular Room service appropriate? Yes; Fluid consistency: Thin  Diet effective now       ?  ? ?  ?  ? ?  ? ? ?EDUCATION NEEDS:  ? ?No education needs have been identified at this time ? ?Skin:  Skin Assessment: Reviewed RN Assessment ? ?Last BM:  4/19 ? ?Height:  ? ?Ht Readings from Last 1 Encounters:  ?08/11/21 '5\' 10"'$  (1.778 m)  ? ? ?Weight:  ? ?Wt Readings from Last 1 Encounters:  ?08/11/21 50.3 kg  ? ? ?BMI:  Body mass index is 15.93 kg/m?. ? ?Estimated Nutritional Needs:  ? ?Kcal:  1900-2100 ? ?Protein:  85-100g ? ?Fluid:  2L/day ? ?Clayton Bibles, MS, RD, LDN ?Inpatient Clinical Dietitian ?Contact information available via Amion ? ?

## 2021-08-12 NOTE — Progress Notes (Signed)
ANTICOAGULATION CONSULT NOTE -  ? ?Pharmacy Consult for Warfarin ?Indication: atrial fibrillation ? ?Allergies  ?Allergen Reactions  ? Atorvastatin Other (See Comments)  ?  Muscle aches  ? Meperidine Nausea And Vomiting  ? ? ?Patient Measurements: ?Height: '5\' 10"'$  (177.8 cm) ?Weight: 50.3 kg (111 lb) ?IBW/kg (Calculated) : 68.5 ?Heparin Dosing Weight:  ? ?Vital Signs: ?Temp: 98 ?F (36.7 ?C) (05/03 0911) ?Temp Source: Oral (05/03 0911) ?BP: 162/87 (05/03 0911) ?Pulse Rate: 77 (05/03 0911) ? ?Labs: ?Recent Labs  ?  08/11/21 ?1211 08/12/21 ?2355  ?HGB 15.3* 12.6  ?HCT 46.7* 40.1  ?PLT 150 121*  ?LABPROT 18.2* 18.3*  ?INR 1.5* 1.5*  ?CREATININE 0.66 0.39*  ? ? ? ?Estimated Creatinine Clearance: 52 mL/min (A) (by C-G formula based on SCr of 0.39 mg/dL (L)). ? ? ?Assessment: ?32 yoF admitted on 5/2 for abdominal pain, constipation, metastatic colon cancer.  Pharmacy is consulted to dose warfarin.  She is on PTA Warfarin for history of Afib.  CT abdomen shows hepatic metastasis and resultant partial portal vein thrombus. CTa negative for PE.  ? ?PTA Warfarin '10mg'$  daily except '5mg'$  on Mon/Wed/Saturday.  LD on 5/1.   ?INR 1.5, subtherapeutic on admission 5/2 ?- During previous admission, INR decreased from supratherapeutic 3.3 down to therapeutic range after holding x2 days then lower doses '5mg'$ /2.'5mg'$   ? ?08/12/2021 ?INR remain 1.5 today after 10 mg dose last night ?CBC: Hgb 15.3> 12.6, Plt 150> 121 ?No bleeding or complications reported ?LFTs: WNL, Tbili 1.1 (5/2)  ?No major drug-drug interactions noted ?Diet: regular ? ?Goal of Therapy:  ?INR 2-3 ?Monitor platelets by anticoagulation protocol: Yes ?  ?Plan:  ?Repeat Warfarin 10 mg PO x 1. ?Daily PT/INR. ?Monitor for signs and symptoms of bleeding. ?Consider adding LMWH 30 qday until INR > 2 ? ?Eudelia Bunch, Pharm.D ?08/12/2021 11:20 AM ? ?

## 2021-08-12 NOTE — Discharge Planning (Signed)
Oncology Discharge Planning Admission Note ? ?[facility] ?Address: [address] [place], [place], [place] [address] ?Hours of Operation:  8am - 5pm, Monday - Friday  ?Clinic Contact Information:  [phone]) [phone] ? ?Oncology Care Team: ?Medical Oncologist:  Dr. Betsy Coder ? ?Contacted  daughter, Sabrina [name]  to inform that the oncology provider Dr. Benay Spice is aware of this hospital admission dated 08/11/21, and the cancer center will follow Sabrina [name] inpatient care to assist with discharge planning as indicated by the oncologist.  We will reach out to you closer to discharge date to arrange your follow up care. ? ?Disclaimer:  This [facility] note does not imply a formal consult request has been made by the admitting attending for this admission or there will be an inpatient consult completed by oncology.  Please request oncology consults as per standard process as indicated. ?

## 2021-08-12 NOTE — TOC Initial Note (Signed)
Transition of Care (TOC) - Initial/Assessment Note  ? ? ?Patient Details  ?Name: Sabrina [name] ?MRN: [id number] ?Date of Birth: 1950-04-24 ? ?Transition of Care (TOC) CM/SW Contact:    ?Tawanna Cooler, RN ?Phone Number: ?08/12/2021, 11:58 AM ? ?Clinical Narrative:                 ? ?Transition of Care Department [facility]) has reviewed patient and no TOC needs have been identified at this time. We will continue to monitor patient advancement through interdisciplinary progression rounds. If new patient transition needs arise, please place a TOC consult. ? ? ? ? ?Expected Discharge Plan: Home/Self Care ?Barriers to Discharge: Continued Medical Work up ? ? ?Expected Discharge Plan and Services ?Expected Discharge Plan: Home/Self Care ?  ?  ? Living arrangements for the past 2 months: [place] ?                ?  ?Prior Living Arrangements/Services ?Living arrangements for the past 2 months: [place] ?Lives with:: Self ?  ? ? ?Activities of Daily Living ?Home Assistive Devices/Equipment: None ?ADL Screening (condition at time of admission) ?Patient's cognitive ability adequate to safely complete daily activities?: Yes ?Is the patient deaf or have difficulty hearing?: No ?Does the patient have difficulty seeing, even when wearing glasses/contacts?: No ?Does the patient have difficulty concentrating, remembering, or making decisions?: No ?Patient able to express need for assistance with ADLs?: Yes ?Does the patient have difficulty dressing or bathing?: Yes ?Independently performs ADLs?: No ?Communication: Independent ?Dressing (OT): Needs assistance ?Is this a change from baseline?: Change from baseline, expected to last >3 days ?Grooming: Needs assistance ?Is this a change from baseline?: Change from baseline, expected to last >3 days ?Feeding: Independent ?Bathing: Needs assistance ?Is this a change from baseline?: Change from baseline, expected to last >3 days ?Toileting: Needs assistance ?Is this a  change from baseline?: Change from baseline, expected to last >3days ?In/Out Bed: Needs assistance ?Is this a change from baseline?: Change from baseline, expected to last >3 days ?Walks in Home: Dependent ?Is this a change from baseline?: Change from baseline, expected to last >3 days ?Does the patient have difficulty walking or climbing stairs?: Yes ?Weakness of Legs: Both ?Weakness of Arms/Hands: Both ? ?Emotional Assessment ?  ?Orientation: : Oriented to Self, Oriented to Place, Oriented to  Time, Oriented to Situation ?Alcohol / Substance Use: Not Applicable ?Psych Involvement: No (comment) ? ?Admission diagnosis:  Right upper quadrant abdominal pain [R10.11] ?Constipation [K59.00] ?Constipation, unspecified constipation type [K59.00] ?Patient Active Problem List  ? Diagnosis Date Noted  ? Constipation 08/11/2021  ? Generalized weakness 08/11/2021  ? Dehydration 08/04/2021  ? Failure to thrive in adult 08/04/2021  ? Cancer associated pain 08/04/2021  ? Anorexia 08/04/2021  ? Leukocytosis 08/04/2021  ? Pain in the abdomen 08/04/2021  ? Protein-calorie malnutrition, severe ([place]) 08/04/2021  ? Breast mass, right 08/04/2021  ? Goals of care, counseling/discussion 02/04/2021  ? Metastatic colon cancer to liver ([place]) 03/26/2020  ? Diabetes ([place])   ? Dysrhythmia   ? Hyperlipidemia   ? Personal history of chemotherapy   ? Personal history of radiation therapy   ? Iron deficiency anemia due to chronic blood loss 12/26/2019  ? Port-A-Cath in place 08/24/2018  ? Preop cardiovascular exam 07/05/2018  ? Genetic testing 07/05/2018  ? Family history of breast cancer   ? Family history of leukemia   ? Malignant neoplasm of upper-outer quadrant of right breast in female, estrogen receptor  positive ([place]) 06/26/2018  ? Diabetes mellitus ([place]) 06/12/2018  ? Smoking 07/12/2017  ? Statin intolerance 12/08/2016  ? Long term (current) use of anticoagulants [Z79.01] 11/08/2016  ? Encounter for therapeutic drug monitoring 11/08/2016  ?  Atrial fibrillation ([place]) [I48.91] 11/08/2016  ? Benign essential hypertension 08/25/2015  ? Hypothyroidism due to Hashimoto's thyroiditis 05/23/2015  ? Type 2 diabetes mellitus without complication, without long-term current use of insulin ([place]) 05/23/2015  ? Paroxysmal atrial fibrillation ([place]) 02/13/2015  ? Dyslipidemia 02/13/2015  ? ?PCP:  [name], NP ?Pharmacy:   ?[place], [place] ?[facility] [place] [address] ?Phone: [phone] Fax: [phone] ? ?

## 2021-08-12 NOTE — Evaluation (Signed)
Occupational Therapy Evaluation ?Patient Details ?Name: Sabrina [name] ?MRN: [id number] ?DOB: 1951-01-14 ?Today's Date: 08/12/2021 ? ? ?History of Present Illness Sabrina [name] is a 71 y.o. female with medical history significant for colon cancer s/p right colectomy with hepatic and abdominal nodal metastasis, right breast cancer s/p lumpectomy adjuvant radiation and chemo with recently found new right breast mass, stable portal vein thrombosis, PAF on Coumadin, T2DM, hypothyroidism, iron deficiency anemia due to chronic blood loss who presented to the ED for evaluation of progressive abdominal pain and constipation. Patient was recently admitted 08/04/2021-08/08/2021 for intractable abdominal and back pain related to metastatic disease.  ? ?Clinical Impression ?  ?Mrs. Sabrina [name] is a 71 year old woman who presents with generalized weakness, decreased activity tolerance and impaired balance resulting in needing min guard to supervision for ADLs and min assist for hand hold for in room ambulation. Patient reports family has been providing near 24/7 supervision at home and assisting with IADLs. From an OT standpoint she is able to perform her ADLs with min guard and min assist for steadying during ambulation. She is able to don her socks, perform toileting and stand at sink to wash her face and hands. Patient has no acute care OT needs.  Will defer balance, ambulation and endurance to PT services.  ?   ? ?Recommendations for follow up therapy are one component of a multi-disciplinary discharge planning process, led by the attending physician.  Recommendations may be updated based on patient status, additional functional criteria and insurance authorization.  ? ?Follow Up Recommendations ? No OT follow up  ?  ?Assistance Recommended at Discharge Frequent or constant Supervision/Assistance  ?Patient can return home with the following A little help with bathing/dressing/bathroom;A little help with walking and/or  transfers;Assistance with cooking/housework;Help with stairs or ramp for entrance ? ?  ?Functional Status Assessment ? Patient has had a recent decline in their functional status and demonstrates the ability to make significant improvements in function in a reasonable and predictable amount of time.  ?Equipment Recommendations ? Tub/shower seat  ?  ?Recommendations for Other Services   ? ? ?  ?Precautions / Restrictions Precautions ?Precautions: Fall ?Restrictions ?Weight Bearing Restrictions: No  ? ?  ? ?Mobility Bed Mobility ?  ?  ?  ?  ?  ?  ?  ?  ?  ? ?Transfers ?  ?  ?  ?  ?  ?  ?  ?  ?  ?  ?  ? ?  ?Balance Overall balance assessment: Mild deficits observed, not formally tested ?  ?  ?  ?  ?  ?  ?  ?  ?  ?  ?  ?  ?  ?  ?  ?  ?  ?  ?   ? ?ADL either performed or assessed with clinical judgement  ? ?ADL Overall ADL's : Needs assistance/impaired ?Eating/Feeding: Independent ?  ?Grooming: Min guard;Standing ?  ?Upper Body Bathing: Supervision/ safety ?  ?Lower Body Bathing: Supervison/ safety;Sit to/from stand ?  ?Upper Body Dressing : Set up;Sitting ?  ?Lower Body Dressing: Min guard;Sit to/from stand ?  ?Toilet Transfer: BSC/3in1;Minimal assistance ?  ?Toileting- Water quality scientist and Hygiene: Min guard;Sit to/from stand ?  ?  ?  ?Functional mobility during ADLs: Minimal assistance ?General ADL Comments: Hand hold or hold on IV pole to ambulate in room. ADLs mostly min guard to supervision.  ? ? ? ?Vision Patient Visual Report: No change from baseline ?   ?   ?  Perception   ?  ?Praxis   ?  ? ?Pertinent Vitals/Pain Pain Assessment ?Pain Assessment: No/denies pain  ? ? ? ?Hand Dominance Right ?  ?Extremity/Trunk Assessment Upper Extremity Assessment ?Upper Extremity Assessment: Overall WFL for tasks assessed ?  ?Lower Extremity Assessment ?Lower Extremity Assessment: Defer to PT evaluation ?  ?Cervical / Trunk Assessment ?Cervical / Trunk Assessment: Normal ?  ?Communication Communication ?Communication: No  difficulties ?  ?Cognition Arousal/Alertness: Awake/alert ?Behavior During Therapy: [facility] for tasks assessed/performed ?Overall Cognitive Status: Within Functional Limits for tasks assessed ?  ?  ?  ?  ?  ?  ?  ?  ?  ?  ?  ?  ?  ?  ?  ?  ?  ?  ?  ?General Comments    ? ?  ?Exercises   ?  ?Shoulder Instructions    ? ? ?Home Living Family/patient expects to be discharged to:: Private residence ?Living Arrangements: Alone ?Available Help at Discharge: Family;Neighbor (daughters and grandson has been staying near 24/7 assistance recently) ?Type of Home: House ?Home Access: Stairs to enter ?Entrance Stairs-Number of Steps: 2 ?  ?Home Layout: One level ?  ?  ?Bathroom Shower/Tub: Walk-in shower ?  ?  ?  ?  ?Home Equipment: Kasandra Knudsen - single point ?  ?Additional Comments: daughter and grandson and neighbors all help out ?  ? ?  ?Prior Functioning/Environment Prior Level of Function : Independent/Modified Independent ?  ?  ?  ?  ?  ?  ?Mobility Comments: got a cane when she left hospital ?ADLs Comments: modified independent, daugther has been helping with IADLs ?  ? ?  ?  ?OT Problem List: Impaired balance (sitting and/or standing);Decreased strength ?  ?   ?OT Treatment/Interventions:    ?  ?OT Goals(Current goals can be found in the care plan section) Acute Rehab OT Goals ?OT Goal Formulation: All assessment and education complete, DC therapy  ?OT Frequency:   ?  ? ?Co-evaluation   ?  ?  ?  ?  ? ?  ?AM-PAC OT "6 Clicks" Daily Activity     ?Outcome Measure Help from another person eating meals?: None ?Help from another person taking care of personal grooming?: A Little ?Help from another person toileting, which includes using toliet, bedpan, or urinal?: A Little ?Help from another person bathing (including washing, rinsing, drying)?: A Little ?Help from another person to put on and taking off regular upper body clothing?: A Little ?Help from another person to put on and taking off regular lower body clothing?: A Little ?6 Click  Score: 19 ?  ?End of Session Nurse Communication:  (okay to see) ? ?Activity Tolerance: Patient tolerated treatment well ?Patient left: in bed;with bed alarm set ? ?OT Visit Diagnosis: Muscle weakness (generalized) (M62.81)  ?              ?Time: 7096-2836 ?OT Time Calculation (min): 19 min ?Charges:  OT General Charges ?$OT Visit: 1 Visit ?OT Evaluation ?$OT Eval Low Complexity: 1 Low ? ?Oryon Gary, OTR/L ?Acute Care Rehab Services  ?Office [phone] ?Pager: [phone]  ? ?Tashica Provencio L [place] ?08/12/2021, 10:11 AM ?

## 2021-08-12 NOTE — Progress Notes (Addendum)
IP PROGRESS NOTE ? ?Subjective:  ? ?Sabrina [name] is known to me with a history of metastatic colon cancer.  She was discharged from the hospital 08/08/2021 after admission with nausea, abdominal pain, and constipation. ?She was scheduled for outpatient follow-up and pembrolizumab at the cancer center yesterday.  She presented to the emergency room yesterday with complaints of abdominal pain and constipation. ?She reports no bowel movement since discharge from the hospital.  The abdominal pain is relieved with hydrocodone.  She was admitted for further evaluation. ? ?Objective: ?Vital signs in last 24 hours: ?Blood pressure (!) 166/86, pulse 70, temperature 98.1 ?F (36.7 ?C), temperature source Oral, resp. rate 16, height 5' 10"  (1.778 m), weight 111 lb (50.3 kg), SpO2 93 %. ? ?Intake/Output from previous day: ?No intake/output data recorded. ? ?Physical Exam: ? ?HEENT: No thrush ?Lungs: Scattered inspiratory/expiratory wheeze and rhonchi, no respiratory distress ?Cardiac: Regular rate and rhythm ?Abdomen: No mass, no hepatosplenomegaly, nontender ?Extremities: No leg edema ? ? ?Portacath/PICC-without erythema ? ?Lab Results: ?Recent Labs  ?  08/11/21 ?1211 08/12/21 ?1031  ?WBC 16.2* 14.1*  ?HGB 15.3* 12.6  ?HCT 46.7* 40.1  ?PLT 150 121*  ? ? ?BMET ?Recent Labs  ?  08/11/21 ?1211 08/12/21 ?5945  ?NA 138 139  ?K 3.1* 3.7  ?CL 92* 97*  ?CO2 33* 34*  ?GLUCOSE 155* 120*  ?BUN 24* 22  ?CREATININE 0.66 0.39*  ?CALCIUM 9.6 8.9  ? ? ?Lab Results  ?Component Value Date  ? CEA1 1.40 12/16/2020  ? CEA 1.57 02/16/2021  ? ? ?Studies/Results: ?CT Angio Chest PE W and/or Wo Contrast ? ?Result Date: 08/11/2021 ?CLINICAL DATA:  Weakness, history of metastatic colon cancer EXAM: CT ANGIOGRAPHY CHEST WITH CONTRAST TECHNIQUE: Multidetector CT imaging of the chest was performed using the standard protocol during bolus administration of intravenous contrast. Multiplanar CT image reconstructions and MIPs were obtained to evaluate the vascular  anatomy. RADIATION DOSE REDUCTION: This exam was performed according to the departmental dose-optimization program which includes automated exposure control, adjustment of the mA and/or kV according to patient size and/or use of iterative reconstruction technique. CONTRAST:  37m OMNIPAQUE IOHEXOL 350 MG/ML SOLN COMPARISON:  03/20/2020, 12/30/2020, 08/11/2021 FINDINGS: Cardiovascular: This is a technically adequate evaluation of the pulmonary vasculature. No filling defects or pulmonary emboli. The heart is unremarkable. Trace pericardial fluid. No evidence of thoracic aortic aneurysm or dissection. Mild atherosclerosis of the aorta, with moderate atherosclerosis throughout the coronary vasculature. Left chest wall port via subclavian approach tip within the superior vena cava at the atriocaval junction. Mediastinum/Nodes: No enlarged mediastinal, hilar, or axillary lymph nodes. Thyroid gland, trachea, and esophagus demonstrate no significant findings. Lungs/Pleura: Since the 12/30/2020 PET scan, multiple pulmonary nodules have developed. Nodules are as follows: Left upper lobe, image 41/6, 7 x 6 mm. Left upper lobe, image 88/6, 7 x 7 mm. Left lower lobe, image 92/6, 3 x 3 mm. There are other stable bilateral pulmonary nodules, measuring 3 mm in the right lower lobe image 92/6, and measuring 10 mm in the left lower lobe image 77/6. These nodules have been stable since 2021 and are likely benign. No acute airspace disease, effusion, or pneumothorax. The central airways are patent. Upper Abdomen: Please refer to CT abdomen and pelvis performed earlier today describing hepatic metastatic disease. Musculoskeletal: No acute or destructive bony lesions. Reconstructed images demonstrate no additional findings. Review of the MIP images confirms the above findings. IMPRESSION: 1. Subcentimeter left upper and left lower lobe pulmonary nodules which have developed since  prior PET scan 12/30/2020. Metastatic disease cannot be  excluded. Largest nodule is 7 mm in mean diameter, borderline for detection by PET scan. Continued follow-up is warranted. 2. Stable right lower and left lower lobe pulmonary nodules as above, likely benign given stability since 2021. 3. Please refer to CT abdomen report from earlier today describing hepatic metastatic disease. 4. No evidence of pulmonary embolus. 5. Aortic Atherosclerosis (ICD10-I70.0). Coronary artery atherosclerosis. Electronically Signed   By: Randa Ngo M.D.   On: 08/11/2021 16:37  ? ?CT ABDOMEN PELVIS W CONTRAST ? ?Result Date: 08/11/2021 ?CLINICAL DATA:  Nausea and vomiting. Bowel obstruction suspected. Metastatic colon cancer. No bowel movements in 2 weeks. * Tracking Code: BO * EXAM: CT ABDOMEN AND PELVIS WITH CONTRAST TECHNIQUE: Multidetector CT imaging of the abdomen and pelvis was performed using the standard protocol following bolus administration of intravenous contrast. RADIATION DOSE REDUCTION: This exam was performed according to the departmental dose-optimization program which includes automated exposure control, adjustment of the mA and/or kV according to patient size and/or use of iterative reconstruction technique. CONTRAST:  73m OMNIPAQUE IOHEXOL 300 MG/ML  SOLN COMPARISON:  08/06/2021 FINDINGS: Lower chest: Right base scarring. A 3 mm subpleural left lower lobe pulmonary nodule on 01/06 is not readily apparent on the prior of only a few days ago. Normal heart size without pericardial or pleural effusion. Hepatobiliary: Enhancing lesion within segment 7 measures 1.6 cm on 16/2 versus 1.4 cm on the prior. The dominant mass within the central right hepatic lobe measures on the order of 6.5 x 5.3 cm on 24/2 and is similar to 6.6 x 5.1 cm on the prior exam. This is again causes partial, right greater than left portal vein thrombosis centrally including on 20/2. Satellite lesion within the more posteroinferior right hepatic lobe measures 1.5 cm on 30/2 and is unchanged.  Cholecystectomy.  No common duct dilatation. Pancreas: Pancreatic atrophy, without duct dilatation or acute inflammation. Spleen: Normal in size, without focal abnormality. Adrenals/Urinary Tract: Normal right adrenal gland. Left adrenal thickening. Normal kidneys, without hydronephrosis. Normal urinary bladder. Stomach/Bowel: Varices surrounding the distal esophagus and adjacent the gastric cardia. Otherwise normal stomach. Large colonic stool burden. Normal small bowel. Vascular/Lymphatic: Aortic atherosclerosis. 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 Abdominal retroperitoneal adenopathy. Index preaortic node of 1.5 cm on 30/2, similar (when remeasured). Aortocaval node measures 1.6 cm on 27/2 and is unchanged. Reproductive: Hysterectomy.  No adnexal mass. Other: No significant free fluid. Pelvic floor laxity. No free intraperitoneal air. No evidence of omental or peritoneal disease. Musculoskeletal: Osteopenia. Lumbosacral spondylosis convex left lumbar spine curvature. IMPRESSION: 1. Since 08/06/2021, similar appearance of hepatic metastasis, including the dominant central right hepatic lobe mass and resultant partial portal vein thrombus. 2. Similar abdominal nodal metastasis. 3. No evidence of bowel obstruction. 4. Nonspecific tiny left lower lobe pulmonary nodule, not readily apparent on the prior exam. Recommend attention on follow-up. Electronically Signed   By: KAbigail MiyamotoM.D.   On: 08/11/2021 13:55   ? ?Medications: I have reviewed the patient's current medications. ? 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, stage IIIc pT3pN2b ?Moderately differentiated adenocarcinoma, 9/30 lymph nodes positive, lymphovascular invasion present including large vessel extramural invasion, loss of MLH1 and PMS2, MLH1 hyper methylation present ?Incomplete colonoscopy secondary to stricturing at the sigmoid colon ?Virtual colonoscopy 03/13/2020- mass centered  at the medial wall of the cecum and terminal ileum, adjacent ileocolic mesenteric adenopathy, nonspecific 3 mm right lower lobe nodule ?PET 132/12/2482hypermetabolic cecal/terminal  ileum mass with adjacent enla

## 2021-08-13 DIAGNOSIS — K5909 Other constipation: Secondary | ICD-10-CM | POA: Diagnosis not present

## 2021-08-13 LAB — CBC WITH DIFFERENTIAL/PLATELET
Abs Immature Granulocytes: 0.24 10*3/uL — ABNORMAL HIGH (ref 0.00–0.07)
Basophils Absolute: 0.1 10*3/uL (ref 0.0–0.1)
Basophils Relative: 1 %
Eosinophils Absolute: 0 10*3/uL (ref 0.0–0.5)
Eosinophils Relative: 0 %
HCT: 40.1 % (ref 36.0–46.0)
Hemoglobin: 12.9 g/dL (ref 12.0–15.0)
Immature Granulocytes: 2 %
Lymphocytes Relative: 5 %
Lymphs Abs: 0.6 10*3/uL — ABNORMAL LOW (ref 0.7–4.0)
MCH: 29.1 pg (ref 26.0–34.0)
MCHC: 32.2 g/dL (ref 30.0–36.0)
MCV: 90.3 fL (ref 80.0–100.0)
Monocytes Absolute: 1 10*3/uL (ref 0.1–1.0)
Monocytes Relative: 7 %
Neutro Abs: 11.3 10*3/uL — ABNORMAL HIGH (ref 1.7–7.7)
Neutrophils Relative %: 85 %
Platelets: 112 10*3/uL — ABNORMAL LOW (ref 150–400)
RBC: 4.44 MIL/uL (ref 3.87–5.11)
RDW: 17.7 % — ABNORMAL HIGH (ref 11.5–15.5)
WBC: 13.3 10*3/uL — ABNORMAL HIGH (ref 4.0–10.5)
nRBC: 0 % (ref 0.0–0.2)

## 2021-08-13 LAB — BASIC METABOLIC PANEL
Anion gap: 8 (ref 5–15)
BUN: 16 mg/dL (ref 8–23)
CO2: 34 mmol/L — ABNORMAL HIGH (ref 22–32)
Calcium: 8.7 mg/dL — ABNORMAL LOW (ref 8.9–10.3)
Chloride: 97 mmol/L — ABNORMAL LOW (ref 98–111)
Creatinine, Ser: 0.32 mg/dL — ABNORMAL LOW (ref 0.44–1.00)
GFR, Estimated: >60 mL/min (ref >60)
Glucose, Bld: 123 mg/dL — ABNORMAL HIGH (ref 70–99)
Potassium: 3 mmol/L — ABNORMAL LOW (ref 3.5–5.1)
Sodium: 139 mmol/L (ref 135–145)

## 2021-08-13 LAB — PROTIME-INR
INR: 3.9 — ABNORMAL HIGH (ref 0.8–1.2)
INR: 3.8 — ABNORMAL HIGH (ref 0.8–1.2)
Prothrombin Time: 38 seconds — ABNORMAL HIGH (ref 11.4–15.2)
Prothrombin Time: 36.9 seconds — ABNORMAL HIGH (ref 11.4–15.2)

## 2021-08-13 MED ORDER — ENOXAPARIN SODIUM 30 MG/0.3ML IJ SOSY
30.0000 mg | PREFILLED_SYRINGE | Freq: Every day | INTRAMUSCULAR | Status: DC
  Administered 2021-08-13: 30 mg via SUBCUTANEOUS
  Filled 2021-08-13: qty 0.3

## 2021-08-13 MED ORDER — POTASSIUM CHLORIDE 10 MEQ/100ML IV SOLN
10.0000 meq | INTRAVENOUS | Status: AC
  Administered 2021-08-13 (×4): 10 meq via INTRAVENOUS
  Filled 2021-08-13 (×4): qty 100

## 2021-08-13 NOTE — Progress Notes (Signed)
Ms. Marasco is doing okay this morning.  She still having some abdominal pain.  She did have a bowel movement yesterday.  This did make her feel a little bit better. ? ?Is hard to say, she really is eating.  She does look a little bit on the frail side. ? ?Her labs show a white count 13.3.  Hemoglobin 12.9.  Platelet count 112,000.  The potassium is 3.0.  BUN 16 creatinine 0.32. ? ?She has had no bleeding.  She has had no fever.  There is a little bit of nausea but no vomiting. ? ?Her vital signs show temperature 98.  Pulse 75.  Blood pressure 173/93.  Her lungs are clear bilaterally.  Cardiac exam regular rate and rhythm.  Abdomen is soft.  Bowel sounds are decreased but present.  She has no abdominal mass.  There is no fluid wave.  Extremity shows some muscle atrophy bilaterally in upper and lower extremities.  Neurological exam is nonfocal. ? ?Ms. Vandenbosch has a abdominal pain.  Maybe, this is from constipation.  The constipation seems to be doing a little bit better. ? ?We will give her some IV potassium today.  This may help her intestinal motility. ? ?She seems awful nice.  I have good fellowship with her this morning. ? ?I do appreciate the great care she is getting from all the staff on 5 E. ? ?Lattie Haw, MD ? ?Galatians 5:22 ?

## 2021-08-13 NOTE — Progress Notes (Signed)
ANTICOAGULATION CONSULT NOTE ? ?Pharmacy Consult for Warfarin ?Indication: atrial fibrillation ? ?Allergies  ?Allergen Reactions  ? Atorvastatin Other (See Comments)  ?  Muscle aches  ? Meperidine Nausea And Vomiting  ? ? ?Patient Measurements: ?Height: '5\' 10"'$  (177.8 cm) ?Weight: 52.9 kg (116 lb 10 oz) ?IBW/kg (Calculated) : 68.5 ? ?Vital Signs: ?BP: 170/91 (05/04 0805) ?Pulse Rate: 77 (05/04 0805) ? ?Labs: ?Recent Labs  ?  08/11/21 ?1211 08/12/21 ?3151 08/13/21 ?0359 08/13/21 ?1504 08/13/21 ?1615  ?HGB 15.3* 12.6 12.9  --   --   ?HCT 46.7* 40.1 40.1  --   --   ?PLT 150 121* 112*  --   --   ?LABPROT 18.2* 18.3*  --  38.0* 36.9*  ?INR 1.5* 1.5*  --  3.9* 3.8*  ?CREATININE 0.66 0.39* 0.32*  --   --   ? ? ?Estimated Creatinine Clearance: 54.6 mL/min (A) (by C-G formula based on SCr of 0.32 mg/dL (L)). ? ? ?Assessment: ?60 yoF admitted on 5/2 for abdominal pain, constipation, metastatic colon cancer. Pharmacy is consulted to dose warfarin. She is on warfarin PTA for history of Afib. CT abdomen shows hepatic metastasis and resultant partial portal vein thrombus. CTa negative for PE.  ? ?PTA Warfarin dose: '10mg'$  daily except '5mg'$  on Monday/Wednesday/Saturday.  Last dose on 5/1 at 2200.  ?   ?INR 1.5, subtherapeutic on admission 5/2 ?- During previous admission, INR decreased from supratherapeutic 3.3 down to therapeutic range after holding x 2 days then lower doses '5mg'$ /2.'5mg'$   ? ?Today, 08/13/2021: ?INR  3.9 after warfarin 10 mg x 2 days, now supratherapeutic. Concern for lab error, so INR rechecked. Repeat INR still supratherapeutic at 3.8.  ?CBC: Hgb 15.3 > 12.6 > 12.9, Plt 150 > 121 > 112 ?No bleeding or complications reported ?LFTs: WNL, Tbili 1.1 (5/2)  ?No major drug-drug interactions noted ?Diet: regular ? ?Goal of Therapy:  ?INR 2-3 ?Monitor platelets by anticoagulation protocol: Yes ?  ?Plan:  ?Hold warfarin today  ?Daily PT/INR ?Monitor CBC and for signs and symptoms of bleeding. ?LMWH '30mg'$  SQ q24h was added this AM  until INR > 2 -- will now discontinue based on current INR per discussion with MD ? ? ?Lindell Spar, PharmD, BCPS ?Clinical Pharmacist  ?08/13/2021 4:53 PM ? ?

## 2021-08-13 NOTE — Evaluation (Signed)
Physical Therapy Evaluation ?Patient Details ?Name: Sabrina [name] ?MRN: [id number] ?DOB: 11-Sep-1950 ?Today's Date: 08/13/2021 ? ?History of Present Illness ? Sabrina [name] is a 71 y.o. female with medical history significant for colon cancer s/p right colectomy with hepatic and abdominal nodal metastasis, right breast cancer s/p lumpectomy adjuvant radiation and chemo with recently found new right breast mass, stable portal vein thrombosis, PAF on Coumadin, T2DM, hypothyroidism, iron deficiency anemia due to chronic blood loss who presented to the ED for evaluation of progressive abdominal pain and constipation. Patient was recently admitted 08/04/2021-08/08/2021 for intractable abdominal and back pain related to metastatic disease.  ?Clinical Impression ? Pt was assisted to side of bed and to stand with minor help and was attended by daughter.  Her family is available to help but will not be living with her.  Pt has a complex medical history and will need time to recover strength and endurance to walk alone.  Pt will be seen acutely for goals of PT as ordered, and recommend SNF for short stay to increase safety to walk and return home in a timely way.  Pt is motivated to try this and family supportive as well.  Follow up for goals of PT and encourage her to sit OOB and do standing/walking with assist as her endurance can allow.   ?   ? ?Recommendations for follow up therapy are one component of a multi-disciplinary discharge planning process, led by the attending physician.  Recommendations may be updated based on patient status, additional functional criteria and insurance authorization. ? ?Follow Up Recommendations Skilled nursing-short term rehab (<3 hours/day) ? ?  ?Assistance Recommended at Discharge Frequent or constant Supervision/Assistance  ?Patient can return home with the following ? A little help with walking and/or transfers;A little help with bathing/dressing/bathroom;Assistance with  cooking/housework;Assist for transportation;Help with stairs or ramp for entrance ? ?  ?Equipment Recommendations Rolling walker (2 wheels)  ?Recommendations for Other Services ?    ?  ?Functional Status Assessment Patient has had a recent decline in their functional status and demonstrates the ability to make significant improvements in function in a reasonable and predictable amount of time.  ? ?  ?Precautions / Restrictions Precautions ?Precautions: Fall ?Precaution Comments: monitor for sats and HR ?Restrictions ?Weight Bearing Restrictions: No  ? ?  ? ?Mobility ? Bed Mobility ?Overal bed mobility: Needs Assistance ?Bed Mobility: Supine to Sit, Sit to Supine ?  ?  ?Supine to sit: Supervision ?Sit to supine: Min guard ?  ?General bed mobility comments: extra time and HOB up ?  ? ?Transfers ?Overall transfer level: Needs assistance ?Equipment used: Rolling walker (2 wheels) ?Transfers: Sit to/from Stand ?Sit to Stand: Min guard ?  ?  ?  ?  ?  ?General transfer comment: min guard for safety due to fatigue ?  ? ?Ambulation/Gait ?Ambulation/Gait assistance: Min guard ?Gait Distance (Feet): 12 Feet ?Assistive device: Rolling walker (2 wheels) ?Gait Pattern/deviations: Step-to pattern, Decreased stride length ?Gait velocity: reduced ?Gait velocity interpretation: <1.31 ft/sec, indicative of household ambulator ?Pre-gait activities: standing balance and endurance ?General Gait Details: used side of bed with RW for support due to fatigue ? ?Stairs ?  ?  ?  ?  ?  ? ?Wheelchair Mobility ?  ? ?Modified Rankin (Stroke Patients Only) ?  ? ?  ? ?Balance Overall balance assessment: Needs assistance ?Sitting-balance support: Feet supported ?Sitting balance-Leahy Scale: Fair ?  ?  ?Standing balance support: Bilateral upper extremity supported, During functional activity ?Standing balance-Leahy Scale: Poor ?Standing  balance comment: fragile, low energy ?  ?  ?  ?  ?  ?  ?  ?  ?  ?  ?  ?   ? ? ? ?Pertinent Vitals/Pain Pain  Assessment ?Pain Assessment: Faces ?Faces Pain Scale: Hurts little more ?Pain Location: abd area ?Pain Descriptors / Indicators: Guarding ?Pain Intervention(s): Monitored during session, Premedicated before session, Repositioned, Limited activity within patient's tolerance  ? ? ?Home Living Family/patient expects to be discharged to:: Private residence ?Living Arrangements: Alone ?Available Help at Discharge: Family;Neighbor ?Type of Home: House ?Home Access: Stairs to enter ?Entrance Stairs-Rails: Can reach both ?Entrance Stairs-Number of Steps: 2 ?  ?Home Layout: One level ?Home Equipment: Kasandra Knudsen - single point ?Additional Comments: daughter and grandson and neighbors all help out  ?  ?Prior Function Prior Level of Function : Independent/Modified Independent ?  ?  ?  ?  ?  ?  ?Mobility Comments: new SPC use, no recent falls ?  ?  ? ? ?Hand Dominance  ? Dominant Hand: Right ? ?  ?Extremity/Trunk Assessment  ? Upper Extremity Assessment ?Upper Extremity Assessment: Generalized weakness ?  ? ?Lower Extremity Assessment ?Lower Extremity Assessment: Generalized weakness ?  ? ?Cervical / Trunk Assessment ?Cervical / Trunk Assessment: Normal ?Cervical / Trunk Exceptions: thin fragile appearance  ?Communication  ? Communication: No difficulties  ?Cognition Arousal/Alertness: Awake/alert ?Behavior During Therapy: [place] for tasks assessed/performed ?Overall Cognitive Status: Within Functional Limits for tasks assessed ?  ?  ?  ?  ?  ?  ?  ?  ?  ?  ?  ?  ?  ?  ?  ?  ?General Comments: motivated and interested in therapy ?  ?  ? ?  ?General Comments General comments (skin integrity, edema, etc.): pt was assisted to walk with minor help needed but very low endurance.  Assisted her to stand and walk in the room, used RW for support due to her feeling of weakness on legs. ? ?  ?Exercises    ? ?Assessment/Plan  ?  ?PT Assessment Patient needs continued PT services  ?PT Problem List Decreased strength;Decreased activity  tolerance;Decreased balance;Decreased mobility;Decreased coordination;Decreased knowledge of use of DME;Pain;Decreased skin integrity ? ?   ?  ?PT Treatment Interventions DME instruction;Gait training;Stair training;Functional mobility training;Therapeutic activities;Therapeutic exercise;Balance training;Neuromuscular re-education;Patient/family education   ? ?PT Goals (Current goals can be found in the Care Plan section)  ?Acute Rehab PT Goals ?Patient Stated Goal: get strength back ?PT Goal Formulation: With patient ?Time For Goal Achievement: 08/20/21 ?Potential to Achieve Goals: Good ? ?  ?Frequency Min 3X/week ?  ? ? ?Co-evaluation   ?  ?  ?  ?  ? ? ?  ?AM-PAC PT "6 Clicks" Mobility  ?Outcome Measure Help needed turning from your back to your side while in a flat bed without using bedrails?: None ?Help needed moving from lying on your back to sitting on the side of a flat bed without using bedrails?: A Little ?Help needed moving to and from a bed to a chair (including a wheelchair)?: A Little ?Help needed standing up from a chair using your arms (e.g., wheelchair or bedside chair)?: A Little ?Help needed to walk in hospital room?: A Little ?Help needed climbing 3-5 steps with a railing? : A Lot ?6 Click Score: 18 ? ?  ?End of Session Equipment Utilized During Treatment: Other (comment) (declined gait belt over abd discomfort and her port a cath) ?Activity Tolerance: Patient limited by fatigue;Patient limited by pain ?Patient  left: in bed;with call bell/phone within reach;with bed alarm set;with family/visitor present ?Nurse Communication: Mobility status ?PT Visit Diagnosis: Muscle weakness (generalized) (M62.81);Difficulty in walking, not elsewhere classified (R26.2);Pain ?  ? ?Time: 4353-9122 ?PT Time Calculation (min) (ACUTE ONLY): 25 min ? ? ?Charges:   PT Evaluation ?$PT Eval Moderate Complexity: 1 Mod ?PT Treatments ?$Therapeutic Activity: 8-22 mins ?  ?   ? ?Ramond Dial ?08/13/2021, 2:06 PM ? ?Mee Hives,  PT PhD ?Acute Rehab Dept. Number: [facility] [phone] and [place] [phone] ? ? ?

## 2021-08-13 NOTE — Progress Notes (Signed)
?PROGRESS NOTE ? ?[name]  ?DOB: 24-Aug-1950  ?PCP: [name], NP ?DEY:[id number]  ?DOA: 08/11/2021 ? LOS: 1 day  ?Hospital Day: 3 ? ?Brief narrative: ?Deeksha Cotrell is a 71 y.o. female with PMH significant for DM2, HTN, HLD, A-fib on Coumadin, hypothyroidism, depression, chronic blood loss/iron deficiency anemia, Colon cancer s/p right colectomy with hepatic and abdominal nodal metastasis, right breast cancer status postlumpectomy, adjuvant radiation and chemo with recently found new right breast mass, stable portal vein thrombosis  ?Recently hospitalized 4/25 to 4/29 for intractable abdomen pain and back pain related to metastatic disease.  She was treated with IV pain medicines and was transitioned to oral oxycodone at discharge.  She also had associated constipation requiring Dulcolax suppository and Fleet enema.  In that admission, she was noted to have a new right breast mass and plan for outpatient biopsy. ? ?Patient returned to the ED on 5/2 with complaint of persistent generalized abdominal pain. ?Last BM was 2 weeks ago, has associated nausea, anorexia.  No vomiting ? ?In the ED, patient was afebrile, tachycardic to 117 ?Labs with WC count elevated to 16.2, potassium low at 3.1, BUN/creatinine elevated at 24/0.66 ?CT abdomen/pelvis with contrast showed large stool burden without evidence of bowel obstruction.  It also showed known intra-abdominal metastasis and portal vein thrombosis as seen previous hospitalization.   ?CT PA was also done which did not show any evidence of pulm embolism but showed a new subcentimeter left upper and left lower lobe pulmonary nodules with stable right lower left lower lobe pulmonary nodules.   ?  ?Patient was given IV Dilaudid 1 mg x 2, 1 L LR, IV Zofran, glycerin suppository, Fleet enema.   ?Admitted to hospitalist service. ? ?Subjective: ?Patient was seen and examined this morning.   ?Propped up in bed.  Feels weak.  Had a small bowel movement this morning.   Does not feel emptied.   ?Daughter at bedside. ? ?Principal Problem: ?  Constipation ?Active Problems: ?  Cancer associated pain ?  Metastatic colon cancer to liver [facility]) ?  Failure to thrive in adult ?  Paroxysmal atrial fibrillation (HCC) ?  Type 2 diabetes mellitus without complication, without long-term current use of insulin ([place]) ?  Leukocytosis ?  Hypothyroidism due to Hashimoto's thyroiditis ?  Iron deficiency anemia due to chronic blood loss ?  Generalized weakness ?  ? 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  ?-Started on aggressive bowel regimen with scheduled Senokot, MiraLAX, oral Dulcolax and as needed enema.  Had a small bowel movement this morning. ?-Continue MiraLAX, Senokot.  Give 1 more enema today. ? ?Metastatic colon cancer to liver   ?Cancer associated pain ?-Currently on oxycodone 10 mg every 4 hours as needed, Dilaudid as needed. ?-Follows up with oncology Dr. Benay Spice. ? ?Failure to thrive in adult ?Generalized weakness ?-Patient with progressive functional decline with anorexia, weight loss, cancer associated pain, generalized weakness. ?-Consult nutrition ?-PT/OT eval obtained.  SNF recommended. ? ?Paroxysmal atrial fibrillation ?-Continue flecainide and Coumadin.   ?-Coumadin dosing per pharmacy.  Lovenox for bridge while she has subtherapeutic INR. ?Recent Labs  ?Lab 08/07/21 ?3149 08/08/21 ?7026 08/11/21 ?1211 08/12/21 ?3785  ?INR 2.9* 2.5* 1.5* 1.5*  ? ?Type 2 diabetes mellitus ?-Keep metformin on hold. ?-Sliding scale insulin with Accu-Cheks ?Recent Labs  ?  Lab 08/07/21 ?1215 08/07/21 ?1651 08/07/21 ?2201 08/08/21 ?0730 08/08/21 ?1206  ?GLUCAP 161* 116* 123* 110* 131*  ? ?Iron deficiency anemia due to chronic blood loss ?-Hemoglobin stable. ?Recent Labs  ?   08/05/21 ?0454 08/07/21 ?5329 08/11/21 ?1211 08/12/21 ?9242 08/13/21 ?0359  ?HGB 13.9 13.9 15.3* 12.6 12.9  ?MCV 89.3 90.4 89.3 90.1 90.3  ? ?Hypothyroidism due to Hashimoto's thyroiditis ?-Continue Synthroid. ? ?Goals of care ?  Code Status: DNR  ? ? ?Mobility: Encourage ambulation.  Pending PT eval ?Skin assessment:  ?  ? ?Nutritional status:  ?Body mass index is 16.73 kg/m?Marland Kitchen  ?Nutrition Problem: Severe Malnutrition ?Etiology: chronic illness, cancer and cancer related treatments ?Signs/Symptoms: severe fat depletion, severe muscle depletion, percent weight loss ? ? ? ? ?Diet:  ?Diet Order   ? ?       ?  Diet regular Room service appropriate? Yes; Fluid consistency: Thin  Diet effective now       ?  ? ?  ?  ? ?  ? ? ?DVT prophylaxis:  ?enoxaparin (LOVENOX) injection 30 mg Start: 08/13/21 1245 ?  ?Antimicrobials: None ?Fluid: NS at 75 mill per hour to continue ?Consultants: None ?Family Communication: Daughter Estill Bamberg at bedside ? ?Status is: Observation ? ?Continue in-hospital care because: Large stool burden in colon.  need to have good BM.  SNF recommended by PT ?Level of care: Telemetry  ? ?Dispo: The patient is from: Home ?             Anticipated d/c is to: Pending clinical course, pending SNF ?             Patient currently is not medically stable to d/c. ?  Difficult to place patient No ? ? ? ? ?Infusions:  ? sodium chloride 75 mL/hr at 08/12/21 1354  ? ? ?Scheduled Meds: ? anastrozole  1 mg Oral Daily  ? bisacodyl  10 mg Oral Daily  ? Chlorhexidine Gluconate Cloth  6 each Topical Daily  ? enoxaparin (LOVENOX) injection  30 mg Subcutaneous Daily  ? feeding supplement  1 Container Oral TID BM  ? flecainide  100 mg Oral QHS  ? levothyroxine  100 mcg Oral QHS  ? multivitamin with minerals  1 tablet Oral Daily  ? polyethylene glycol  17 g Oral BID  ? senna-docusate  1 tablet Oral BID  ? sodium chloride flush  10-40 mL Intracatheter Q12H  ? sodium chloride flush  3 mL Intravenous Q12H  ? Warfarin - Pharmacist  Dosing Inpatient   Does not apply q1600  ? ? ?PRN meds: ?acetaminophen **OR** acetaminophen, bisacodyl, HYDROmorphone (DILAUDID) injection, melatonin, ondansetron **OR** ondansetron (ZOFRAN) IV, oxyCODONE, pantoprazole, sodium chloride flush, sorbitol, milk of mag, mineral oil, glycerin (SMOG) enema  ? ?Antimicrobials: ?Anti-infectives (From admission, onward)  ? ? None  ? ?  ? ? ?Objective: ?Vitals:  ? 08/13/21 0318 08/13/21 0805  ?BP: (!) 173/93 (!) 170/91  ?Pulse: 75 77  ?Resp: 15 14  ?Temp: 98 ?F (36.7 ?C)   ?SpO2: 92%   ? ? ?Intake/Output Summary (Last 24 hours) at 08/13/2021 1509 ?Last data filed at 08/13/2021 1432 ?Gross per 24 hour  ?Intake 1260 ml  ?Output --  ?Net 1260 ml  ? ?Filed Weights  ? 08/11/21 1042 08/13/21 0500  ?Weight: 50.3 kg 52.9 kg  ? ?Weight change: 2.551 kg ?Body mass index is 16.73 kg/m?.  ? ?Physical Exam: ?General exam: Pleasant, elderly Caucasian female.  Not in physical distress skin: No rashes, lesions or ulcers. ?  HEENT: Atraumatic, normocephalic, no obvious bleeding ?Lungs: Clear to auscultation bilaterally ?CVS: Regular rate and rhythm, no murmur ?GI/Abd soft, mild diffuse tenderness, nondistended, bowel sound present ?CNS: Alert, awake, oriented x3 ?Psychiatry: Depressed look ?Extremities: No pedal edema, no calf tenderness ? ?Data Review: I have personally reviewed the laboratory data and studies available. ? ?F/u labs  ?Unresulted Labs (From admission, onward)  ? ?  Start     Ordered  ? 08/14/21 0500  Protime-INR  Daily,   R     ?Question:  Specimen collection method  Answer:  IV Team=IV Team collect  ? 08/13/21 1151  ? 08/13/21 1151  Protime-INR  Once,   R       ?Question:  Specimen collection method  Answer:  IV Team=IV Team collect  ? 08/13/21 1150  ? 08/13/21 0500  CBC with Differential/Platelet  Daily,   R     ? 08/12/21 1328  ? 08/13/21 5053  Basic metabolic panel  Daily,   R     ? 08/12/21 1328  ? ?  ?  ? ?  ? ? ?Signed, ?Terrilee Croak, MD ?[facility] ?08/13/2021 ? ? ? ? ? ? ? ? ? ? ? ? ?

## 2021-08-14 DIAGNOSIS — K5909 Other constipation: Secondary | ICD-10-CM | POA: Diagnosis not present

## 2021-08-14 LAB — CBC WITH DIFFERENTIAL/PLATELET
Abs Immature Granulocytes: 0.06 10*3/uL (ref 0.00–0.07)
Basophils Absolute: 0.1 10*3/uL (ref 0.0–0.1)
Basophils Relative: 0 %
Eosinophils Absolute: 0 10*3/uL (ref 0.0–0.5)
Eosinophils Relative: 0 %
HCT: 42.9 % (ref 36.0–46.0)
Hemoglobin: 13.4 g/dL (ref 12.0–15.0)
Immature Granulocytes: 1 %
Lymphocytes Relative: 6 %
Lymphs Abs: 0.7 10*3/uL (ref 0.7–4.0)
MCH: 28.2 pg (ref 26.0–34.0)
MCHC: 31.2 g/dL (ref 30.0–36.0)
MCV: 90.1 fL (ref 80.0–100.0)
Monocytes Absolute: 1 10*3/uL (ref 0.1–1.0)
Monocytes Relative: 9 %
Neutro Abs: 9.3 10*3/uL — ABNORMAL HIGH (ref 1.7–7.7)
Neutrophils Relative %: 84 %
Platelets: 106 10*3/uL — ABNORMAL LOW (ref 150–400)
RBC: 4.76 MIL/uL (ref 3.87–5.11)
RDW: 17.4 % — ABNORMAL HIGH (ref 11.5–15.5)
WBC: 11.1 10*3/uL — ABNORMAL HIGH (ref 4.0–10.5)
nRBC: 0 % (ref 0.0–0.2)

## 2021-08-14 LAB — BASIC METABOLIC PANEL
Anion gap: 9 (ref 5–15)
BUN: 8 mg/dL (ref 8–23)
CO2: 33 mmol/L — ABNORMAL HIGH (ref 22–32)
Calcium: 8.3 mg/dL — ABNORMAL LOW (ref 8.9–10.3)
Chloride: 96 mmol/L — ABNORMAL LOW (ref 98–111)
Creatinine, Ser: <0.3 mg/dL — ABNORMAL LOW (ref 0.44–1.00)
Glucose, Bld: 112 mg/dL — ABNORMAL HIGH (ref 70–99)
Potassium: 3.2 mmol/L — ABNORMAL LOW (ref 3.5–5.1)
Sodium: 138 mmol/L (ref 135–145)

## 2021-08-14 LAB — PROTIME-INR
INR: 4.2 (ref 0.8–1.2)
Prothrombin Time: 40 seconds — ABNORMAL HIGH (ref 11.4–15.2)

## 2021-08-14 LAB — MAGNESIUM: Magnesium: 2 mg/dL (ref 1.7–2.4)

## 2021-08-14 MED ORDER — SORBITOL 70 % SOLN
15.0000 mL | Freq: Two times a day (BID) | Status: DC
  Administered 2021-08-14 – 2021-08-15 (×4): 15 mL via ORAL
  Filled 2021-08-14 (×6): qty 30

## 2021-08-14 MED ORDER — METOPROLOL TARTRATE 12.5 MG HALF TABLET
12.5000 mg | ORAL_TABLET | Freq: Two times a day (BID) | ORAL | Status: DC
  Administered 2021-08-14 – 2021-08-17 (×6): 12.5 mg via ORAL
  Filled 2021-08-14 (×6): qty 1

## 2021-08-14 MED ORDER — FLEET ENEMA 7-19 GM/118ML RE ENEM
1.0000 | ENEMA | Freq: Every day | RECTAL | Status: DC
  Administered 2021-08-14 – 2021-08-15 (×2): 1 via RECTAL
  Filled 2021-08-14 (×3): qty 1

## 2021-08-14 MED ORDER — POTASSIUM CHLORIDE CRYS ER 20 MEQ PO TBCR
40.0000 meq | EXTENDED_RELEASE_TABLET | Freq: Every day | ORAL | Status: DC
  Administered 2021-08-14 – 2021-08-15 (×2): 40 meq via ORAL
  Filled 2021-08-14 (×2): qty 2

## 2021-08-14 NOTE — Progress Notes (Signed)
She is having some bowel movements.  She says that is some diarrhea.  I will know if there is any type of obstruction that might be causing the diarrhea.  She has had no vomiting. ? ?Her potassium is 3.2 now.  This is up a little bit.  We will give her some supplemental potassium yesterday. ? ?Her CBC looks okay.  Her white cell count is 11.1.  Hemoglobin 13.4.  Platelet count 106,000. ? ?She is having some abdominal discomfort. ? ?She is out of bed a little bit. ? ?She does not have much of an appetite.  I did offer her some Marinol.  She will think about this. ? ?Her vital signs show temperature 98.5.  Pulse 81.  Blood pressure 162/78.  Her abdominal exam is soft.  Bowel sounds are active.  There may be a little bit hyperactive.  Lungs are clear.  Cardiac exam regular rate and rhythm. ? ?Ms. Sano has this abdominal pain.  The constipation seems to be improving a little bit.  Again the diarrhea might be a sign that there could be some that might be causing a little bit of obstruction. ? ?Hopefully, she will continue to improve. ? ?She is quite frail.  It would be nice of her appetite improved a little bit.  Maybe the appetite will improve once the bowel movements improve. ? ?Lattie Haw, MD ? ?Psalm 4:8 ?

## 2021-08-14 NOTE — Progress Notes (Incomplete)
Patient had multiple Bs overnight.  ?

## 2021-08-14 NOTE — Progress Notes (Signed)
?PROGRESS NOTE ? ?[name]  ?DOB: 01/09/1951  ?PCP: [name], NP ?FTD:[id number]  ?DOA: 08/11/2021 ? LOS: 2 days  ?Hospital Day: 4 ? ?Brief narrative: ?Sabrina [name] is a 71 y.o. female with PMH significant for DM2, HTN, HLD, A-fib on Coumadin, hypothyroidism, depression, chronic blood loss/iron deficiency anemia, Colon cancer s/p right colectomy with hepatic and abdominal nodal metastasis, right breast cancer status postlumpectomy, adjuvant radiation and chemo with recently found new right breast mass, stable portal vein thrombosis  ?Recently hospitalized 4/25 to 4/29 for intractable abdomen pain and back pain related to metastatic disease.  She was treated with IV pain medicines and was transitioned to oral oxycodone at discharge.  She also had associated constipation requiring Dulcolax suppository and Fleet enema.  In that admission, she was noted to have a new right breast mass and plan for outpatient biopsy. ? ?Patient returned to the ED on 5/2 with complaint of persistent generalized abdominal pain. ?Last BM was 2 weeks ago, has associated nausea, anorexia.  No vomiting ? ?In the ED, patient was afebrile, tachycardic to 117 ?Labs with WC count elevated to 16.2, potassium low at 3.1, BUN/creatinine elevated at 24/0.66 ?CT abdomen/pelvis with contrast showed large stool burden without evidence of bowel obstruction.  It also showed known intra-abdominal metastasis and portal vein thrombosis as seen previous hospitalization.   ?CT PA was also done which did not show any evidence of pulm embolism but showed a new subcentimeter left upper and left lower lobe pulmonary nodules with stable right lower left lower lobe pulmonary nodules.   ?  ?Patient was given IV Dilaudid 1 mg x 2, 1 L LR, IV Zofran, glycerin suppository, Fleet enema.   ?Admitted to hospitalist service. ? ?Subjective: ?Still quite constipated-no stool since 5 AM no chest pain ?Quite weak ? ?Principal Problem: ?  Constipation ?Active  Problems: ?  Cancer associated pain ?  Metastatic colon cancer to liver [facility]) ?  Failure to thrive in adult ?  Paroxysmal atrial fibrillation (HCC) ?  Type 2 diabetes mellitus without complication, without long-term current use of insulin ([place]) ?  Leukocytosis ?  Hypothyroidism due to Hashimoto's thyroiditis ?  Iron deficiency anemia due to chronic blood loss ?  Generalized weakness ?  ? 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  ?-Started on aggressive bowel regimen with scheduled sorbitol twice daily as well as fleets enema tonight ?-If no stool in a.m. get plain x-ray to rule out further obstruction/obstipation from stool ? ?Metastatic colon cancer to liver   ?Cancer associated pain ?-Currently on oxycodone 10 mg every 4 hours as needed, Dilaudid as needed. ?-Follows up with oncology Dr. Benay Spice. ? ?Failure to thrive in adult ?Generalized weakness ?-Patient with progressive functional decline with anorexia, weight loss, cancer associated pain, generalized weakness. ?-Consult nutrition ?-PT/OT eval obtained.  SNF recommended. ? ?Paroxysmal atrial fibrillation ?-Continue flecainide 100 at bedtime and Coumadin.   ?-Slightly supratherapeutic today probably because of malnutrition-pharmacy dosing CBGs below ?-Check a.m. magnesium ? ?Type 2 diabetes mellitus ?-Keep metformin on hold. ?-CBG's all below 150 ? ?Iron deficiency anemia due to chronic blood loss ?-Hemoglobin stable. ? ?Hypothyroidism due to Hashimoto's thyroiditis ?-Continue Synthroid 100 mcg daily ? ?Hypokalemia ?Mild metabolic alkalosis ?Replaced orally with K. Dur 40 ?Alkalosis and elevated CO2 present only since this admission-probably  related to MiraLAX and also other laxatives  ?would start Diamox if goes above 38 ? ?Goals of  care ?  Code Status: DNR  ? ? ?Mobility: Encourage ambulation.  Pending PT eval ?Skin assessment:  ?  ? ?Nutritional status:  ?Body mass index is 16.54 kg/m?Marland Kitchen  ?Nutrition Problem: Severe Malnutrition ?Etiology: chronic illness, cancer and cancer related treatments ?Signs/Symptoms: severe fat depletion, severe muscle depletion, percent weight loss ? ? ? ? ?Diet:  ?Diet Order   ? ?       ?  Diet regular Room service appropriate? Yes; Fluid consistency: Thin  Diet effective now       ?  ? ?  ?  ? ?  ? ? ?DVT prophylaxis:  ? ?  ?Antimicrobials: None ?Fluid: NS at 75 mill per hour to continue ?Consultants: None ?Family Communication: Daughter Estill Bamberg at bedside ? ?Status is: Observation ? ?Continue in-hospital care because: Large stool burden in colon.  need to have good BM.  SNF recommended by PT ?Level of care: Telemetry  ? ?Dispo: The patient is from: Home ?             Anticipated d/c is to: Pending clinical course, pending SNF ?             Patient currently is not medically stable to d/c. ?  Difficult to place patient No ? ? ? ? ?Infusions:  ? sodium chloride 75 mL/hr at 08/14/21 0148  ? ? ?Scheduled Meds: ? anastrozole  1 mg Oral Daily  ? bisacodyl  10 mg Oral Daily  ? Chlorhexidine Gluconate Cloth  6 each Topical Daily  ? feeding supplement  1 Container Oral TID BM  ? flecainide  100 mg Oral QHS  ? levothyroxine  100 mcg Oral QHS  ? multivitamin with minerals  1 tablet Oral Daily  ? polyethylene glycol  17 g Oral BID  ? potassium chloride  40 mEq Oral Daily  ? senna-docusate  1 tablet Oral BID  ? sodium chloride flush  10-40 mL Intracatheter Q12H  ? sodium chloride flush  3 mL Intravenous Q12H  ? Warfarin - Pharmacist Dosing Inpatient   Does not apply q1600  ? ? ?PRN meds: ?acetaminophen **OR** acetaminophen, bisacodyl, HYDROmorphone (DILAUDID) injection, melatonin, ondansetron **OR** ondansetron (ZOFRAN) IV, oxyCODONE, pantoprazole, sodium chloride flush  ? ?Antimicrobials: ?Anti-infectives (From admission,  onward)  ? ? None  ? ?  ? ? ?Objective: ?Vitals:  ? 08/14/21 0520 08/14/21 0834  ?BP: (!) 162/78 (!) 181/104  ?Pulse: 81 85  ?Resp: 17   ?Temp: 98.5 ?F (36.9 ?C)   ?SpO2: 94%   ? ? ?Intake/Output Summary (Last 24 hours) at 08/14/2021 1346 ?Last data filed at 08/14/2021 1311 ?Gross per 24 hour  ?Intake 710 ml  ?Output --  ?Net 710 ml  ? ? ?Filed Weights  ? 08/11/21 1042 08/13/21 0500 08/14/21 0500  ?Weight: 50.3 kg 52.9 kg 52.3 kg  ? ?Weight change: -0.6 kg ?Body mass index is 16.54 kg/m?.  ? ?Physical Exam: ? ?Awake coherent in nad ?Quite frail no dsitress ?Cta b no rales no rhonchi ?Abd soft but has some solid masses in abdomen I am not sure if this represents stool masses versus intra-abdominal pathology ?Neurologically intact no focal deficit ? ?Data Review: I have personally reviewed the laboratory data and studies available. ? ?F/u labs  ?Unresulted Labs (From admission, onward)  ? ?  Start     Ordered  ? 08/14/21 0500  Protime-INR  Daily,  R     ?Question:  Specimen collection method  Answer:  IV Team=IV Team collect  ? 08/13/21 1151  ? 08/13/21 0500  CBC with Differential/Platelet  Daily,   R     ? 08/12/21 1328  ? 08/13/21 7858  Basic metabolic panel  Daily,   R     ? 08/12/21 1328  ? ?  ?  ? ?  ? ? ?Signed, ?Nita Sells, MD ?[facility] ?08/14/2021 ? ? ? ? ? ? ? ? ? ? ? ? ?

## 2021-08-14 NOTE — Progress Notes (Signed)
ANTICOAGULATION CONSULT NOTE ? ?Pharmacy Consult for Warfarin ?Indication: atrial fibrillation ? ?Allergies  ?Allergen Reactions  ? Atorvastatin Other (See Comments)  ?  Muscle aches  ? Meperidine Nausea And Vomiting  ? ? ?Patient Measurements: ?Height: '5\' 10"'$  (177.8 cm) ?Weight: 52.3 kg (115 lb 4.8 oz) ?IBW/kg (Calculated) : 68.5 ? ?Vital Signs: ?Temp: 98.5 ?F (36.9 ?C) (05/05 0520) ?Temp Source: Oral (05/05 0520) ?BP: 162/78 (05/05 0520) ?Pulse Rate: 81 (05/05 0520) ? ?Labs: ?Recent Labs  ?  08/12/21 ?5284 08/13/21 ?0359 08/13/21 ?1504 08/13/21 ?1615 08/14/21 ?0430  ?HGB 12.6 12.9  --   --  13.4  ?HCT 40.1 40.1  --   --  42.9  ?PLT 121* 112*  --   --  106*  ?LABPROT 18.3*  --  38.0* 36.9* 40.0*  ?INR 1.5*  --  3.9* 3.8* 4.2*  ?CREATININE 0.39* 0.32*  --   --  <0.30*  ? ? ? ?CrCl cannot be calculated (This lab value cannot be used to calculate CrCl because it is not a number: <0.30). ? ? ?Assessment: ?43 yoF admitted on 5/2 for abdominal pain, constipation, metastatic colon cancer. Pharmacy is consulted to dose warfarin. She is on warfarin PTA for history of Afib. CT abdomen shows hepatic metastasis and resultant partial portal vein thrombus. CTa negative for PE.  ? ?PTA Warfarin dose: '10mg'$  daily except '5mg'$  on Monday/Wednesday/Saturday.  Last dose on 5/1 at 2200.  ?   ?INR 1.5, subtherapeutic on admission 5/2 ?- During previous admission, INR decreased from supratherapeutic 3.3 down to therapeutic range after holding x 2 days then lower doses '5mg'$ /2.'5mg'$   ? ?Today, 08/14/2021: ?INR  4.2, warfarin held yesterday. warfarin 10 mg x 2 days given 5/2 & 5/3, supratherapeutic.  ?CBC: Hgb 13.4, stable;  Plt 150 > 121 > 112> 106 low ?No bleeding or complications reported ?LFTs: WNL, Tbili 1.1 (5/2)  ?No major drug-drug interactions noted ?Diet: regular ? ?Goal of Therapy:  ?INR 2-3 ?Monitor platelets by anticoagulation protocol: Yes ?  ?Plan:  ?Hold warfarin again today  ?Daily PT/INR ?Monitor CBC and for signs and symptoms of  bleeding. ? ?Eudelia Bunch, Pharm.D ?08/14/2021 7:44 AM ? ?

## 2021-08-14 NOTE — Progress Notes (Signed)
Tele notified RN of 14 beat run of SVT, with HR at 156 @ 1825. This RN paged MD Samtani.  ?

## 2021-08-15 DIAGNOSIS — K5909 Other constipation: Secondary | ICD-10-CM | POA: Diagnosis not present

## 2021-08-15 LAB — CBC WITH DIFFERENTIAL/PLATELET
Abs Immature Granulocytes: 0.04 10*3/uL (ref 0.00–0.07)
Basophils Absolute: 0 10*3/uL (ref 0.0–0.1)
Basophils Relative: 0 %
Eosinophils Absolute: 0 10*3/uL (ref 0.0–0.5)
Eosinophils Relative: 0 %
HCT: 41.2 % (ref 36.0–46.0)
Hemoglobin: 13.4 g/dL (ref 12.0–15.0)
Immature Granulocytes: 0 %
Lymphocytes Relative: 5 %
Lymphs Abs: 0.5 10*3/uL — ABNORMAL LOW (ref 0.7–4.0)
MCH: 29.3 pg (ref 26.0–34.0)
MCHC: 32.5 g/dL (ref 30.0–36.0)
MCV: 90 fL (ref 80.0–100.0)
Monocytes Absolute: 0.8 10*3/uL (ref 0.1–1.0)
Monocytes Relative: 8 %
Neutro Abs: 7.8 10*3/uL — ABNORMAL HIGH (ref 1.7–7.7)
Neutrophils Relative %: 87 %
Platelets: 98 10*3/uL — ABNORMAL LOW (ref 150–400)
RBC: 4.58 MIL/uL (ref 3.87–5.11)
RDW: 17.6 % — ABNORMAL HIGH (ref 11.5–15.5)
WBC: 9.1 10*3/uL (ref 4.0–10.5)
nRBC: 0 % (ref 0.0–0.2)

## 2021-08-15 LAB — PROTIME-INR
INR: 3.4 — ABNORMAL HIGH (ref 0.8–1.2)
Prothrombin Time: 33.8 seconds — ABNORMAL HIGH (ref 11.4–15.2)

## 2021-08-15 LAB — BASIC METABOLIC PANEL
Anion gap: 8 (ref 5–15)
BUN: 6 mg/dL — ABNORMAL LOW (ref 8–23)
CO2: 33 mmol/L — ABNORMAL HIGH (ref 22–32)
Calcium: 8.5 mg/dL — ABNORMAL LOW (ref 8.9–10.3)
Chloride: 95 mmol/L — ABNORMAL LOW (ref 98–111)
Creatinine, Ser: <0.3 mg/dL — ABNORMAL LOW (ref 0.44–1.00)
Glucose, Bld: 138 mg/dL — ABNORMAL HIGH (ref 70–99)
Potassium: 3.3 mmol/L — ABNORMAL LOW (ref 3.5–5.1)
Sodium: 136 mmol/L (ref 135–145)

## 2021-08-15 MED ORDER — DRONABINOL 2.5 MG PO CAPS
5.0000 mg | ORAL_CAPSULE | Freq: Two times a day (BID) | ORAL | Status: DC
  Administered 2021-08-15 – 2021-08-16 (×3): 5 mg via ORAL
  Filled 2021-08-15 (×4): qty 2

## 2021-08-15 MED ORDER — SORBITOL 70 % SOLN
300.0000 mL | TOPICAL_OIL | Freq: Once | ORAL | Status: AC
  Administered 2021-08-15: 300 mL via RECTAL
  Filled 2021-08-15: qty 90

## 2021-08-15 MED ORDER — ORAL CARE MOUTH RINSE
15.0000 mL | Freq: Two times a day (BID) | OROMUCOSAL | Status: DC
  Administered 2021-08-15 – 2021-08-17 (×5): 15 mL via OROMUCOSAL

## 2021-08-15 MED ORDER — POLYETHYLENE GLYCOL 3350 17 GM/SCOOP PO POWD
1.0000 | Freq: Once | ORAL | Status: AC
  Administered 2021-08-15: 255 g via ORAL
  Filled 2021-08-15: qty 255

## 2021-08-15 NOTE — Progress Notes (Signed)
ANTICOAGULATION CONSULT NOTE ? ?Pharmacy Consult for Warfarin ?Indication: atrial fibrillation ? ?Allergies  ?Allergen Reactions  ? Atorvastatin Other (See Comments)  ?  Muscle aches  ? Meperidine Nausea And Vomiting  ? ? ?Patient Measurements: ?Height: '5\' 10"'$  (177.8 cm) ?Weight: 52.3 kg (115 lb 4.8 oz) ?IBW/kg (Calculated) : 68.5 ? ?Vital Signs: ?Temp: 98.2 ?F (36.8 ?C) (05/06 0458) ?Temp Source: Oral (05/06 0458) ?BP: 140/87 (05/06 0458) ?Pulse Rate: 81 (05/06 0458) ? ?Labs: ?Recent Labs  ?  08/13/21 ?0359 08/13/21 ?1504 08/13/21 ?1615 08/14/21 ?0430 08/15/21 ?0446  ?HGB 12.9  --   --  13.4 13.4  ?HCT 40.1  --   --  42.9 41.2  ?PLT 112*  --   --  106* 98*  ?LABPROT  --    < > 36.9* 40.0* 33.8*  ?INR  --    < > 3.8* 4.2* 3.4*  ?CREATININE 0.32*  --   --  <0.30* <0.30*  ? < > = values in this interval not displayed.  ? ? ? ?CrCl cannot be calculated (This lab value cannot be used to calculate CrCl because it is not a number: <0.30). ? ? ?Assessment: ?74 yoF admitted on 5/2 for abdominal pain, constipation, metastatic colon cancer. Pharmacy is consulted to dose warfarin. She is on warfarin PTA for history of Afib. CT abdomen shows hepatic metastasis and resultant partial portal vein thrombus. CTa negative for PE.  ? ?PTA Warfarin dose: '10mg'$  daily except '5mg'$  on Monday/Wednesday/Saturday.  Last dose on 5/1 at 2200.  ?   ?INR 1.5, subtherapeutic on admission 5/2 ?- During previous admission, INR decreased from supratherapeutic 3.3 down to therapeutic range after holding x 2 days then lower doses '5mg'$ /2.'5mg'$   ? ?Today, 08/15/2021: ?INR  3.4, supratherapeutic ?warfarin held yesterday, 5/5.  ?warfarin 10 mg x 2 days given 5/2 & 5/3 ?CBC: Hgb 13.4, stable;  Plt 150 > 121 > 112> 106 >98 low ?No bleeding or complications reported ?LFTs: WNL, Tbili 1.1 (5/2)  ?No major drug-drug interactions noted ?Diet: regular ? ?Goal of Therapy:  ?INR 2-3 ?Monitor platelets by anticoagulation protocol: Yes ?  ?Plan:  ?Hold warfarin again today   ?Daily PT/INR ?Monitor CBC and for signs and symptoms of bleeding. ? ?Royetta Asal, PharmD, BCPS ?Clinical Pharmacist ?[facility] ?Please utilize Amion for appropriate phone number to reach the unit pharmacist ([place]) ?08/15/2021 1:10 PM ? ? ?

## 2021-08-15 NOTE — Progress Notes (Signed)
?PROGRESS NOTE ? ?Sabrina [name]  ?DOB: 1950-06-02  ?PCP: [name], NP ?VZC:[id number]  ?DOA: 08/11/2021 ? LOS: 3 days  ?Hospital Day: 5 ? ?Brief narrative: ?Sabrina [name] is a 71 y.o. female with PMH significant for DM2, HTN, HLD, A-fib on Coumadin, hypothyroidism, depression, chronic blood loss/iron deficiency anemia, Colon cancer s/p right colectomy with hepatic and abdominal nodal metastasis, right breast cancer status postlumpectomy, adjuvant radiation and chemo with recently found new right breast mass, stable portal vein thrombosis  ?Recently hospitalized 4/25 to 4/29 for intractable abdomen pain and back pain related to metastatic disease.  She was treated with IV pain medicines and was transitioned to oral oxycodone at discharge.  She also had associated constipation requiring Dulcolax suppository and Fleet enema.  In that admission, she was noted to have a new right breast mass and plan for outpatient biopsy. ? ?Patient returned to the ED on 5/2 with complaint of persistent generalized abdominal pain. ?Last BM was 2 weeks ago, has associated nausea, anorexia.  No vomiting ? ?In the ED, patient was afebrile, tachycardic to 117 ?Labs with WC count elevated to 16.2, potassium low at 3.1, BUN/creatinine elevated at 24/0.66 ?CT abdomen/pelvis with contrast showed large stool burden without evidence of bowel obstruction.  It also showed known intra-abdominal metastasis and portal vein thrombosis as seen previous hospitalization.   ?CT PA was also done which did not show any evidence of pulm embolism but showed a new subcentimeter left upper and left lower lobe pulmonary nodules with stable right lower left lower lobe pulmonary nodules.   ?  ?Patient was given IV Dilaudid 1 mg x 2, 1 L LR, IV Zofran, glycerin suppository, Fleet enema.   ?Admitted to hospitalist service. ? ?Subjective: ? ?No stool since yesterday--no chest pain quite weak ?Daughter asks me outside the room "what her prognosis" I have  explained to her this will have to be carefully discussed with Dr. Benay Spice when he is back and probably will need to be determined as an outpatient ? ?Principal Problem: ?  Constipation ?Active Problems: ?  Cancer associated pain ?  Metastatic colon cancer to liver [facility]) ?  Failure to thrive in adult ?  Paroxysmal atrial fibrillation (HCC) ?  Type 2 diabetes mellitus without complication, without long-term current use of insulin ([place]) ?  Leukocytosis ?  Hypothyroidism due to Hashimoto's thyroiditis ?  Iron deficiency anemia due to chronic blood loss ?  Generalized weakness ?  ? 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  ?-Started on aggressive bowel regimen--patient to get the smog enema today ?-Resume MiraLAX as bowel prep in the morning ? ?Metastatic colon cancer to liver   ?Cancer associated pain ?-Cont oxycodone 10 mg every 4 hours as needed, Dilaudid as needed. ?-oncology Dr. Benay Spice back Monday ?-Watch platelets carefully have dropped slightly ? ?Failure to thrive in adult ?Generalized weakness ?-Patient with progressive functional decline with anorexia, weight loss, cancer associated pain, generalized weakness. ?-Consult nutrition ?-PT/OT eval obtained.  SNF recommended--liekly when appetite ^--start marinol ? ?Paroxysmal atrial fibrillation, some episodic V. tach 5/5 ?-Continue flecainide 100 at bedtime and Coumadin.   ?-Added Toprol 12.5 twice daily 5/5 ?-Slightly supratherapeutic today probably because of malnutrition-pharmacy dosing CBGs below ?-Check a.m. magnesium ? ?Type 2 diabetes mellitus ?-Keep metformin on hold. ?-CBG's all below 150 ? ?  Iron deficiency anemia due to chronic blood loss ?-Hemoglobin stable. ? ?Hypothyroidism due to Hashimoto's thyroiditis ?-Continue  Synthroid 100 mcg daily ? ?Hypokalemia ?Mild metabolic alkalosis ?Replaced orally with K. Dur 40 ?Alkalosis and elevated CO2 present only since this admission-probably related to MiraLAX and also other laxatives  ?would start Diamox if goes above 38 ? ?Goals of care ?  Code Status: DNR  ? ? ?Mobility: Encourage ambulation.  Pending PT eval ?Skin assessment:  ?  ? ?Nutritional status:  ?Body mass index is 16.54 kg/m?Marland Kitchen  ?Nutrition Problem: Severe Malnutrition ?Etiology: chronic illness, cancer and cancer related treatments ?Signs/Symptoms: severe fat depletion, severe muscle depletion, percent weight loss ? ? ? ? ?Diet:  ?Diet Order   ? ?       ?  Diet regular Room service appropriate? Yes; Fluid consistency: Thin  Diet effective now       ?  ? ?  ?  ? ?  ? ? ?DVT prophylaxis:  ?Family Communication: Daughter Sabrina [name] at bedside ? ?Status is: Observation ? ?Continue in-hospital care because: Large stool burden -- ?Level of care: Telemetry  ? ?Dispo: The patient is from: Home ?             Anticipated d/c is to: Pending clinical course, pending SNF ?             Patient currently is not medically stable to d/c. ?  Difficult to place patient No ? ? ? ? ?Infusions:  ? sodium chloride 75 mL/hr at 08/14/21 1440  ? ? ?Scheduled Meds: ? anastrozole  1 mg Oral Daily  ? Chlorhexidine Gluconate Cloth  6 each Topical Daily  ? feeding supplement  1 Container Oral TID BM  ? flecainide  100 mg Oral QHS  ? levothyroxine  100 mcg Oral QHS  ? mouth rinse  15 mL Mouth Rinse BID  ? metoprolol tartrate  12.5 mg Oral BID  ? multivitamin with minerals  1 tablet Oral Daily  ? polyethylene glycol  17 g Oral BID  ? potassium chloride  40 mEq Oral Daily  ? senna-docusate  1 tablet Oral BID  ? sodium chloride flush  10-40 mL Intracatheter Q12H  ? sodium chloride flush  3 mL Intravenous Q12H  ? sodium phosphate  1 enema Rectal QHS  ? sorbitol  15 mL Oral BID  ? Warfarin - Pharmacist Dosing Inpatient   Does not apply q1600  ? ? ?PRN  meds: ?acetaminophen **OR** acetaminophen, HYDROmorphone (DILAUDID) injection, melatonin, ondansetron **OR** ondansetron (ZOFRAN) IV, oxyCODONE, pantoprazole, sodium chloride flush  ? ?Antimicrobials: ?Anti-infectives (From admission, onward)  ? ? None  ? ?  ? ? ?Objective: ?Vitals:  ? 08/15/21 0458 08/15/21 1326  ?BP: 140/87 (!) 186/86  ?Pulse: 81 64  ?Resp: 18 16  ?Temp: 98.2 ?F (36.8 ?C) 98.5 ?F (36.9 ?C)  ?SpO2: 94% 96%  ? ? ?Intake/Output Summary (Last 24 hours) at 08/15/2021 1405 ?Last data filed at 08/15/2021 0900 ?Gross per 24 hour  ?Intake 120 ml  ?Output --  ?Net 120 ml  ? ? ?Filed Weights  ? 08/11/21 1042 08/13/21 0500 08/14/21 0500  ?Weight: 50.3 kg 52.9 kg 52.3 kg  ? ?Weight change:  ?Body mass index is 16.54 kg/m?.  ? ?Physical Exam: ? ?Coherent no distress abdomen soft no rebound no guarding ?Chest clear no added sound no rales rhonchi ?ROM intact ?No lower extremity edema ? ?Data Review: I have personally reviewed the laboratory data and studies available. ? ?F/u labs  ?  Unresulted Labs (From admission, onward)  ? ?  Start     Ordered  ? 08/14/21 0500  Protime-INR  Daily,   R     ?Question:  Specimen collection method  Answer:  IV Team=IV Team collect  ? 08/13/21 1151  ? ?  ?  ? ?  ? ? ?Signed, ?Nita Sells, MD ?[facility] ?08/15/2021 ? ? ? ? ? ? ? ? ? ? ? ? ?

## 2021-08-16 DIAGNOSIS — K5909 Other constipation: Secondary | ICD-10-CM | POA: Diagnosis not present

## 2021-08-16 LAB — COMPREHENSIVE METABOLIC PANEL
ALT: 16 U/L (ref 0–44)
AST: 27 U/L (ref 15–41)
Albumin: 3.6 g/dL (ref 3.5–5.0)
Alkaline Phosphatase: 133 U/L — ABNORMAL HIGH (ref 38–126)
Anion gap: 7 (ref 5–15)
BUN: 7 mg/dL — ABNORMAL LOW (ref 8–23)
CO2: 34 mmol/L — ABNORMAL HIGH (ref 22–32)
Calcium: 8.6 mg/dL — ABNORMAL LOW (ref 8.9–10.3)
Chloride: 97 mmol/L — ABNORMAL LOW (ref 98–111)
Creatinine, Ser: 0.3 mg/dL — ABNORMAL LOW (ref 0.44–1.00)
GFR, Estimated: >60 mL/min (ref >60)
Glucose, Bld: 141 mg/dL — ABNORMAL HIGH (ref 70–99)
Potassium: 2.9 mmol/L — ABNORMAL LOW (ref 3.5–5.1)
Sodium: 138 mmol/L (ref 135–145)
Total Bilirubin: 0.9 mg/dL (ref 0.3–1.2)
Total Protein: 6.1 g/dL — ABNORMAL LOW (ref 6.5–8.1)

## 2021-08-16 LAB — CBC
HCT: 41.3 % (ref 36.0–46.0)
Hemoglobin: 13.3 g/dL (ref 12.0–15.0)
MCH: 28.9 pg (ref 26.0–34.0)
MCHC: 32.2 g/dL (ref 30.0–36.0)
MCV: 89.8 fL (ref 80.0–100.0)
Platelets: 109 10*3/uL — ABNORMAL LOW (ref 150–400)
RBC: 4.6 MIL/uL (ref 3.87–5.11)
RDW: 17.6 % — ABNORMAL HIGH (ref 11.5–15.5)
WBC: 10.3 10*3/uL (ref 4.0–10.5)
nRBC: 0 % (ref 0.0–0.2)

## 2021-08-16 LAB — MAGNESIUM: Magnesium: 2.1 mg/dL (ref 1.7–2.4)

## 2021-08-16 LAB — PROTIME-INR
INR: 3 — ABNORMAL HIGH (ref 0.8–1.2)
Prothrombin Time: 30.5 seconds — ABNORMAL HIGH (ref 11.4–15.2)

## 2021-08-16 MED ORDER — HYDRALAZINE HCL 20 MG/ML IJ SOLN
INTRAMUSCULAR | Status: AC
  Administered 2021-08-16: 5 mg via INTRAVENOUS
  Filled 2021-08-16: qty 1

## 2021-08-16 MED ORDER — HYDRALAZINE HCL 20 MG/ML IJ SOLN
5.0000 mg | INTRAMUSCULAR | Status: DC | PRN
  Administered 2021-08-16: 5 mg via INTRAVENOUS
  Filled 2021-08-16: qty 1

## 2021-08-16 MED ORDER — POTASSIUM CHLORIDE CRYS ER 20 MEQ PO TBCR
40.0000 meq | EXTENDED_RELEASE_TABLET | Freq: Two times a day (BID) | ORAL | Status: DC
  Administered 2021-08-16 – 2021-08-17 (×3): 40 meq via ORAL
  Filled 2021-08-16 (×3): qty 2

## 2021-08-16 MED ORDER — WITCH HAZEL-GLYCERIN EX PADS
MEDICATED_PAD | CUTANEOUS | Status: DC | PRN
  Filled 2021-08-16: qty 100

## 2021-08-16 MED ORDER — WARFARIN SODIUM 2.5 MG PO TABS
2.5000 mg | ORAL_TABLET | Freq: Once | ORAL | Status: AC
Start: 2021-08-16 — End: 2021-08-16
  Administered 2021-08-16: 2.5 mg via ORAL
  Filled 2021-08-16: qty 1

## 2021-08-16 MED ORDER — HYDROMORPHONE HCL 1 MG/ML IJ SOLN: 1.0000 mg | INTRAMUSCULAR | Status: DC | PRN

## 2021-08-16 MED ORDER — POTASSIUM CHLORIDE IN NACL 40-0.9 MEQ/L-% IV SOLN
INTRAVENOUS | Status: DC
  Filled 2021-08-16 (×3): qty 1000

## 2021-08-16 NOTE — Progress Notes (Signed)
?PROGRESS NOTE ? ?Sabrina [name]  ?DOB: [birth year]  ?PCP: [name], NP ?LFY:[id number]  ?DOA: 08/11/2021 ? LOS: 4 days  ?Hospital Day: 6 ? ?Brief narrative: ?71 y.o. DM2, HTN, HLD, A-fib on Coumadin, hypothyroidism, depression, chronic blood loss/iron deficiency anemia, Colon cancer s/p right colectomy with hepatic and abdominal nodal metastasis, right breast cancer status postlumpectomy, adjuvant radiation and chemo  ? ?recently found new right breast mass, stable portal vein thrombosis . ? ?Recently hospitalized 4/25 to 4/29 for intractable abdomen pain and back pain related to metastatic disease.  ?rx IV pain medicines and was transitioned to oral oxycodone-- constipation requiring Dulcolax suppository and Fleet ene--also noted to have a new right breast mass and plan for outpatient biopsy. ? ?Patient returned to the ED on 5/2 with complaint of persistent generalized abdominal pain. ?Last BM was 2 weeks ago, has associated nausea, anorexia.  No vomiting ? ?Inintially tachycardic to 117,WC count elevated to 16.2, potassium low at 3.1, BUN/creatinine elevated at 24/0.66 ?CT abdomen/pelvis =large stool burden without evidence of bowel obstruction--known intra-abdominal metastasis and portal vein thrombosis as seen previous hospitalization.   ?CT PA was also done which did not show any evidence of pulm embolism but showed a new subcentimeter left upper and left lower lobe pulmonary nodules with stable right lower left lower lobe pulmonary nodules.   ?  ?Patient was given IV Dilaudid 1 mg x 2, 1 L LR, IV Zofran, glycerin suppository, Fleet enema.   ?Admitted to hospitalist service. ? ?Subjective: ? ?Good result from bowel prep given yesterday although she does not like the taste-I told her to use it with Sprite she can continue the senna additionally ?We have discontinued her sorbitol ?She has not had any further arrhythmias are normal so I think we can discontinue them ? ?Principal Problem: ?   Constipation ?Active Problems: ?  Cancer associated pain ?  Metastatic colon cancer to liver [facility]) ?  Failure to thrive in adult ?  Paroxysmal atrial fibrillation (HCC) ?  Type 2 diabetes mellitus without complication, without long-term current use of insulin ([place]) ?  Leukocytosis ?  Hypothyroidism due to Hashimoto's thyroiditis ?  Iron deficiency anemia due to chronic blood loss ?  Generalized weakness ?  ? ?Assessment and Plan: ?Acute on chronic constipation ?-Received smog enema 5/6 with good result ?-Resume MiraLAX as bowel prep, continue senna ?-She definitely needs to eat a little bit more before she discharges ? ?Metastatic colon cancer to liver   ?Cancer associated pain ?-Cont oxycodone 10 mg every 4 hours as needed, Dilaudid as needed will be tapered today to every 4 hours in anticipation of discontinuation 5/8 ?-oncology Dr. Benay Spice back Monday ?- is on anastrozole at this time ?-Watch platelets carefully-has had a downward trend since hospitalization ? ?Failure to thrive in adult ?Generalized weakness  ?cancer related cachexia and severe malnutrition with a BMI of 16  ?-Patient with progressive functional decline ?-PT/OT eval obtained.   ?-SNF recommended--likely when appetite ^--start marinol ? ?Paroxysmal atrial fibrillation, some episodic V. tach 5/5 ?-Continue flecainide 100 at bedtime and Coumadin.   ?-Added Toprol 12.5 twice daily 5/5 ?-INR is normalizing magnesium is okay ? ?Type 2 diabetes mellitus-A1c probably less than 6.5 ?-Keep metformin on hold, given her ongoing weight loss and poor PO ?-CBG's all below 150 ? ?Iron deficiency anemia due to chronic blood loss ?-Hemoglobin stable. ? ?Hypothyroidism due to Hashimoto's thyroiditis ?-Continue Synthroid 100 mcg daily ? ?Hypokalemia, severe ?Mild metabolic alkalosis ?Will place on IV + 40  K @ 75 and increase K-Dur to 40 twice daily if ?Alkalosis and elevated CO2 present only since this admission-probably related to MiraLAX and also other laxatives   ?conisder Diamox if goes above 38 ? ?Goals of care ?  Code Status: DNR  ? ? ?Mobility: Encourage ambulation.  Pending PT eval ?Skin assessment:  ?  ? ?Nutritional status:  ?Body mass index is 16.54 kg/m?Marland Kitchen  ?Nutrition Problem: Severe Malnutrition ?Etiology: chronic illness, cancer and cancer related treatments ?Signs/Symptoms: severe fat depletion, severe muscle depletion, percent weight loss ? ? ? ? ?Diet:  ?Diet Order   ? ?       ?  Diet regular Room service appropriate? Yes; Fluid consistency: Thin  Diet effective now       ?  ? ?  ?  ? ?  ? ? ?DVT prophylaxis:  ?Family Communication: Daughter Estill Bamberg at bedside on several days ?Discussed the plan of care with ? ?Status is: Observation ? ?Continue in-hospital care because: Receiving treatment doses of MiraLAX bowel prep also receiving potassium replacement and not ready for discharge just yet as she is not really eating ?Further clarification of goals of care probably as an outpatient with Dr. Benay Spice ?Level of care: Telemetry  ? ?Dispo: The patient is from: Home ?             Anticipated d/c is to: pending SNF probably in 24 to 48 hours ?             Patient currently is not medically stable to d/c. ?  Difficult to place patient No ? ? ? ? ?Infusions:  ? sodium chloride 75 mL/hr at 08/16/21 1287  ? 0.9 % NaCl with KCl 40 mEq / L    ? ? ?Scheduled Meds: ? anastrozole  1 mg Oral Daily  ? Chlorhexidine Gluconate Cloth  6 each Topical Daily  ? dronabinol  5 mg Oral BID AC  ? feeding supplement  1 Container Oral TID BM  ? flecainide  100 mg Oral QHS  ? levothyroxine  100 mcg Oral QHS  ? mouth rinse  15 mL Mouth Rinse BID  ? metoprolol tartrate  12.5 mg Oral BID  ? multivitamin with minerals  1 tablet Oral Daily  ? potassium chloride  40 mEq Oral BID  ? senna-docusate  1 tablet Oral BID  ? sodium chloride flush  10-40 mL Intracatheter Q12H  ? sodium chloride flush  3 mL Intravenous Q12H  ? sodium phosphate  1 enema Rectal QHS  ? warfarin  2.5 mg Oral ONCE-1600  ?  Warfarin - Pharmacist Dosing Inpatient   Does not apply q1600  ? ? ?PRN meds: ?acetaminophen **OR** acetaminophen, hydrALAZINE, HYDROmorphone (DILAUDID) injection, melatonin, ondansetron **OR** ondansetron (ZOFRAN) IV, oxyCODONE, pantoprazole, sodium chloride flush, witch hazel-glycerin  ? ?Antimicrobials: ?Anti-infectives (From admission, onward)  ? ? None  ? ?  ? ? ?Objective: ?Vitals:  ? 08/15/21 2137 08/16/21 0553  ?BP: (!) 184/97 (!) 181/99  ?Pulse: 77 77  ?Resp: 18 16  ?Temp: 97.8 ?F (36.6 ?C) 98 ?F (36.7 ?C)  ?SpO2: 97% 95%  ? ? ?Intake/Output Summary (Last 24 hours) at 08/16/2021 0909 ?Last data filed at 08/16/2021 0654 ?Gross per 24 hour  ?Intake 720 ml  ?Output 0 ml  ?Net 720 ml  ? ? ?Filed Weights  ? 08/11/21 1042 08/13/21 0500 08/14/21 0500  ?Weight: 50.3 kg 52.9 kg 52.3 kg  ? ?Weight change:  ?Body mass index is 16.54 kg/m?.  ? ?Physical Exam: ? ?  Coherent no distress abdomen soft no rebound no guarding ?Chest clear no added sound no rales rhonchi ?ROM intact ?No lower extremity edema ? ?Data Review: I have personally reviewed the laboratory data and studies available. ? ?F/u labs  ?Unresulted Labs (From admission, onward)  ? ?  Start     Ordered  ? 08/14/21 0500  Protime-INR  Daily,   R     ?Question:  Specimen collection method  Answer:  IV Team=IV Team collect  ? 08/13/21 1151  ? ?  ?  ? ?  ? ? ?Signed, ?Nita Sells, MD ?[facility] ?08/16/2021 ? ? ? ? ? ? ? ? ? ? ? ? ?

## 2021-08-16 NOTE — Progress Notes (Signed)
ANTICOAGULATION CONSULT NOTE ? ?Pharmacy Consult for Warfarin ?Indication: atrial fibrillation ? ?Allergies  ?Allergen Reactions  ? Atorvastatin Other (See Comments)  ?  Muscle aches  ? Meperidine Nausea And Vomiting  ? ? ?Patient Measurements: ?Height: '5\' 10"'$  (177.8 cm) ?Weight: 52.3 kg (115 lb 4.8 oz) ?IBW/kg (Calculated) : 68.5 ? ?Vital Signs: ?Temp: 98 ?F (36.7 ?C) (05/07 0867) ?Temp Source: Oral (05/07 0553) ?BP: 181/99 (05/07 0553) ?Pulse Rate: 77 (05/07 0553) ? ?Labs: ?Recent Labs  ?  08/14/21 ?0430 08/15/21 ?0446 08/16/21 ?0410  ?HGB 13.4 13.4 13.3  ?HCT 42.9 41.2 41.3  ?PLT 106* 98* 109*  ?LABPROT 40.0* 33.8* 30.5*  ?INR 4.2* 3.4* 3.0*  ?CREATININE <0.30* <0.30* 0.30*  ? ? ? ?Estimated Creatinine Clearance: 54 mL/min (A) (by C-G formula based on SCr of 0.3 mg/dL (L)). ? ? ?Assessment: ?34 yoF admitted on 5/2 for abdominal pain, constipation, metastatic colon cancer. Pharmacy is consulted to dose warfarin. She is on warfarin PTA for history of Afib. CT abdomen shows hepatic metastasis and resultant partial portal vein thrombus. CTa negative for PE.  ? ?PTA Warfarin dose: 10 mg daily except '5mg'$  on Monday/Wednesday/Saturday.  Last dose on 5/1 at 2200.  ?   ?INR 1.5, subtherapeutic on admission 5/2 ?- During previous admission, INR decreased from supratherapeutic 3.3 down to therapeutic range after holding x 2 days then lower doses '5mg'$ /2.'5mg'$   ? ?5/2 & 5/3 warfarin 10 mg x 2 days ?5/4, 5/5, 5/6 warfarin doses held ? ?Today, 08/16/2021: ?INR  3.0, therapeutic ?Warfarin held yesterday ?CBC: Hgb 13.3, stable;  Plt 150 > 121 > 112> 106 >98>109 low ?No bleeding or complications reported ?LFTs: WNL, Tbili 0.9 (5/7)  ?No major drug-drug interactions noted ?Diet: regular ?Weight: 52.3 kg ? ?Goal of Therapy:  ?INR 2-3 ?Monitor platelets by anticoagulation protocol: Yes ?  ?Plan:  ?Warfarin 2.5 mg po x 1 today ?Daily PT/INR for daily warfarin dose ?Monitor CBC and for signs and symptoms of bleeding. ? ?Royetta Asal,  PharmD, BCPS ?Clinical Pharmacist ?[place] ?Please utilize Amion for appropriate phone number to reach the unit pharmacist ([place]) ?08/16/2021 7:55 AM ? ? ?

## 2021-08-17 ENCOUNTER — Telehealth: Payer: Self-pay | Admitting: *Deleted

## 2021-08-17 DIAGNOSIS — E038 Other specified hypothyroidism: Secondary | ICD-10-CM

## 2021-08-17 DIAGNOSIS — K5909 Other constipation: Secondary | ICD-10-CM | POA: Diagnosis not present

## 2021-08-17 DIAGNOSIS — D72829 Elevated white blood cell count, unspecified: Secondary | ICD-10-CM

## 2021-08-17 DIAGNOSIS — R531 Weakness: Secondary | ICD-10-CM

## 2021-08-17 DIAGNOSIS — R627 Adult failure to thrive: Secondary | ICD-10-CM | POA: Diagnosis not present

## 2021-08-17 DIAGNOSIS — E119 Type 2 diabetes mellitus without complications: Secondary | ICD-10-CM

## 2021-08-17 DIAGNOSIS — G893 Neoplasm related pain (acute) (chronic): Secondary | ICD-10-CM

## 2021-08-17 DIAGNOSIS — E063 Autoimmune thyroiditis: Secondary | ICD-10-CM

## 2021-08-17 DIAGNOSIS — I480 Paroxysmal atrial fibrillation: Secondary | ICD-10-CM

## 2021-08-17 LAB — COMPREHENSIVE METABOLIC PANEL
ALT: 15 U/L (ref 0–44)
AST: 24 U/L (ref 15–41)
Albumin: 3.4 g/dL — ABNORMAL LOW (ref 3.5–5.0)
Alkaline Phosphatase: 123 U/L (ref 38–126)
Anion gap: 7 (ref 5–15)
BUN: 10 mg/dL (ref 8–23)
CO2: 30 mmol/L (ref 22–32)
Calcium: 8.7 mg/dL — ABNORMAL LOW (ref 8.9–10.3)
Chloride: 99 mmol/L (ref 98–111)
Creatinine, Ser: <0.3 mg/dL — ABNORMAL LOW (ref 0.44–1.00)
Glucose, Bld: 115 mg/dL — ABNORMAL HIGH (ref 70–99)
Potassium: 4.2 mmol/L (ref 3.5–5.1)
Sodium: 136 mmol/L (ref 135–145)
Total Bilirubin: 0.8 mg/dL (ref 0.3–1.2)
Total Protein: 5.8 g/dL — ABNORMAL LOW (ref 6.5–8.1)

## 2021-08-17 LAB — PROTIME-INR
INR: 2.1 — ABNORMAL HIGH (ref 0.8–1.2)
Prothrombin Time: 23 seconds — ABNORMAL HIGH (ref 11.4–15.2)

## 2021-08-17 LAB — CBC
HCT: 41.6 % (ref 36.0–46.0)
Hemoglobin: 13.4 g/dL (ref 12.0–15.0)
MCH: 28.5 pg (ref 26.0–34.0)
MCHC: 32.2 g/dL (ref 30.0–36.0)
MCV: 88.5 fL (ref 80.0–100.0)
Platelets: 147 10*3/uL — ABNORMAL LOW (ref 150–400)
RBC: 4.7 MIL/uL (ref 3.87–5.11)
RDW: 17.5 % — ABNORMAL HIGH (ref 11.5–15.5)
WBC: 9.6 10*3/uL (ref 4.0–10.5)
nRBC: 0 % (ref 0.0–0.2)

## 2021-08-17 LAB — MAGNESIUM: Magnesium: 2 mg/dL (ref 1.7–2.4)

## 2021-08-17 MED ORDER — OXYCODONE HCL 5 MG PO TABS: 5.0000 mg | ORAL_TABLET | Freq: Four times a day (QID) | ORAL | 0 refills | Status: DC | PRN

## 2021-08-17 MED ORDER — FLECAINIDE ACETATE 100 MG PO TABS: 100.0000 mg | ORAL_TABLET | Freq: Every day | ORAL | Status: AC

## 2021-08-17 MED ORDER — PANTOPRAZOLE SODIUM 40 MG PO TBEC: 40.0000 mg | DELAYED_RELEASE_TABLET | Freq: Two times a day (BID) | ORAL | Status: AC | PRN

## 2021-08-17 MED ORDER — HEPARIN SOD (PORK) LOCK FLUSH 100 UNIT/ML IV SOLN
500.0000 [IU] | INTRAVENOUS | Status: AC | PRN
  Administered 2021-08-17: 500 [IU]
  Filled 2021-08-17: qty 5

## 2021-08-17 MED ORDER — POLYETHYLENE GLYCOL 3350 17 G PO PACK: 17.0000 g | PACK | Freq: Two times a day (BID) | ORAL | 0 refills | Status: AC

## 2021-08-17 MED ORDER — WARFARIN SODIUM 2.5 MG PO TABS: 2.5000 mg | ORAL_TABLET | Freq: Once | ORAL | Status: DC

## 2021-08-17 MED ORDER — METOPROLOL TARTRATE 25 MG PO TABS: 12.5000 mg | ORAL_TABLET | Freq: Two times a day (BID) | ORAL | 0 refills | Status: AC

## 2021-08-17 MED ORDER — SENNOSIDES-DOCUSATE SODIUM 8.6-50 MG PO TABS: 1.0000 | ORAL_TABLET | Freq: Two times a day (BID) | ORAL | 0 refills | Status: AC

## 2021-08-17 NOTE — Progress Notes (Signed)
Physical Therapy Treatment ?Patient Details ?Name: Sabrina [name] ?MRN: [id number] ?DOB: 1950-08-16 ?Today's Date: 08/17/2021 ? ? ?History of Present Illness Sabrina [name] is a 71 y.o. female with medical history significant for colon cancer s/p right colectomy with hepatic and abdominal nodal metastasis, right breast cancer s/p lumpectomy adjuvant radiation and chemo with recently found new right breast mass, stable portal vein thrombosis, PAF on Coumadin, T2DM, hypothyroidism, iron deficiency anemia due to chronic blood loss who presented to the ED for evaluation of progressive abdominal pain and constipation. Patient was recently admitted 08/04/2021-08/08/2021 for intractable abdominal and back pain related to metastatic disease. ? ?  ?PT Comments  ? ? Pt seen this am to assess current mobility and discharge plans.  Pt remains frail and weak. Requires assistance to transfer out of bed and stand. High fall risk with ambulation without assistive device in hallway requiring Min Guard for safety. Pt lives alone, family unable to provide reliable and frequent assistance. Pt is unable to manage home health services, appointments, meals, and there is a high fall risk with mobility and ADL's.  Pt agrees to short term stay at SNF near her home to gain strength and improve functional mobility prior to returning home.  ?  ?Recommendations for follow up therapy are one component of a multi-disciplinary discharge planning process, led by the attending physician.  Recommendations may be updated based on patient status, additional functional criteria and insurance authorization. ? ?Follow Up Recommendations ? Skilled nursing-short term rehab (<3 hours/day) ?  ?  ?Assistance Recommended at Discharge Frequent or constant Supervision/Assistance  ?Patient can return home with the following A little help with walking and/or transfers;A little help with bathing/dressing/bathroom;Assistance with cooking/housework;Assist for  transportation;Help with stairs or ramp for entrance ?  ?Equipment Recommendations ? None recommended by PT  ?  ?Recommendations for Other Services   ? ? ?  ?Precautions / Restrictions Precautions ?Precautions: Fall ?Precaution Comments: monitor for sats and HR ?Restrictions ?Weight Bearing Restrictions: No  ?  ? ?Mobility ? Bed Mobility ?Overal bed mobility: Needs Assistance ?Bed Mobility: Supine to Sit, Sit to Supine ?  ?  ?Supine to sit: Supervision ?  ?  ?General bed mobility comments: extra time and HOB up, use of railing ?  ? ?Transfers ?Overall transfer level: Needs assistance ?Equipment used: None ?Transfers: Sit to/from Stand ?Sit to Stand: Min guard ?  ?  ?  ?  ?  ?General transfer comment: min guard for safety due to fatigue ?  ? ?Ambulation/Gait ?Ambulation/Gait assistance: Min guard ?Gait Distance (Feet): 80 Feet ?Assistive device: None ?Gait Pattern/deviations: Decreased stride length, Drifts right/left, Narrow base of support ?Gait velocity: reduced ?  ?  ?General Gait Details: Pt easily fatigues with activity, increased fall risk walking without assistive device. ? ? ?Stairs ?  ?  ?  ?  ?  ? ? ?Wheelchair Mobility ?  ? ?Modified Rankin (Stroke Patients Only) ?  ? ? ?  ?Balance Overall balance assessment: Needs assistance ?Sitting-balance support: Feet supported ?Sitting balance-Leahy Scale: Good ?  ?  ?Standing balance support: No upper extremity supported, During functional activity ?Standing balance-Leahy Scale: Fair ?Standing balance comment: fragile, low energy ?  ?  ?  ?  ?  ?  ?  ?  ?  ?  ?  ?  ? ?  ?Cognition Arousal/Alertness: Awake/alert ?Behavior During Therapy: [facility] for tasks assessed/performed ?Overall Cognitive Status: Within Functional Limits for tasks assessed ?  ?  ?  ?  ?  ?  ?  ?  ?  ?  ?  ?  ?  ?  ?  ?  ?  ?  ?  ? ?  ?  Exercises   ? ?  ?General Comments General comments (skin integrity, edema, etc.):  (Discussed POC with pt and family present. Concerns about pt returning home alone  due to increased fall risk and poor ability to care for herself. Discussed with nursing and team.) ?  ?  ? ?Pertinent Vitals/Pain Pain Assessment ?Pain Assessment: No/denies pain  ? ? ?Home Living   ?  ?  ?  ?  ?  ?  ?  ?  ?  ?   ?  ?Prior Function    ?  ?  ?   ? ?PT Goals (current goals can now be found in the care plan section) Acute Rehab PT Goals ?Patient Stated Goal: get strength back ? ?  ?Frequency ? ? ? Min 3X/week ? ? ? ?  ?PT Plan    ? ? ?Co-evaluation   ?  ?  ?  ?  ? ?  ?AM-PAC PT "6 Clicks" Mobility   ?Outcome Measure ? Help needed turning from your back to your side while in a flat bed without using bedrails?: None ?Help needed moving from lying on your back to sitting on the side of a flat bed without using bedrails?: A Little ?Help needed moving to and from a bed to a chair (including a wheelchair)?: A Little ?Help needed standing up from a chair using your arms (e.g., wheelchair or bedside chair)?: A Little ?Help needed to walk in hospital room?: A Little ?Help needed climbing 3-5 steps with a railing? : A Lot ?6 Click Score: 18 ? ?  ?End of Session Equipment Utilized During Treatment: Gait belt ?Activity Tolerance: Patient limited by fatigue ?Patient left: in chair;with call bell/phone within reach;with family/visitor present;with nursing/sitter in room ?Nurse Communication: Mobility status ?PT Visit Diagnosis: Muscle weakness (generalized) (M62.81);Difficulty in walking, not elsewhere classified (R26.2);Pain ?  ? ? ?Time: 2426-8341 ?PT Time Calculation (min) (ACUTE ONLY): 26 min ? ?Charges:  $Gait Training: 8-22 mins ?$Therapeutic Activity: 8-22 mins          ?          ?Mikel Cella, PTA ? ? ? ?Sabrina [name] ?08/17/2021, 10:36 AM ? ?

## 2021-08-17 NOTE — Progress Notes (Signed)
Patient will be discharging to short term rehab via family this afternoon. Education on medications will be provided. Belongings were returned to patient.  ?

## 2021-08-17 NOTE — Telephone Encounter (Signed)
Oncology Discharge Planning Note ? ?[facility] ?Address: [address] [place], [place],  [address] ?Hours of Operation:  8am - 5pm, Monday - Friday  ?Clinic Contact Information:  [phone] ? ?Oncology Care Team: ?Medical Oncologist:  Dr. Betsy Coder ? ?Patient Details: ?Name:  Sabrina [name], Sabrina [name] ?MRN:   [id number] ?DOB:   Feb 09, 1951 ?Reason for Current Admission: '@PPROB'$ @ ? ?Discharge Planning Narrative: ?Notification of admission received by this office for [name].  Discharge follow-up appointments for oncology will be rescheduled for week o f5/15 at request of daughter today.Upon discharge from the hospital, hematology/oncology's post discharge plan of care for the outpatient setting is: Return for lab/flush/visit and 1st pembrolizumab  ?Patient begin discharged today to [facility] in [place]. ?Outpatient Oncology Specific Care Only: ?Oncology appointment transportation needs addressed?:  Daughter unsure at this time. ?Oncology medication management for symptom management addressed?:  no ?Chemo Alert Card reviewed?:  no ?Immunotherapy Alert Card reviewed?:  no ?

## 2021-08-17 NOTE — Progress Notes (Signed)
ANTICOAGULATION CONSULT NOTE ? ?Pharmacy Consult for Warfarin ?Indication: atrial fibrillation ? ?Allergies  ?Allergen Reactions  ? Atorvastatin Other (See Comments)  ?  Muscle aches  ? Meperidine Nausea And Vomiting  ? ? ?Patient Measurements: ?Height: '5\' 10"'$  (177.8 cm) ?Weight: 53.3 kg (117 lb 8.1 oz) ?IBW/kg (Calculated) : 68.5 ? ?Vital Signs: ?Temp: 98.3 ?F (36.8 ?C) (05/08 0537) ?Temp Source: Oral (05/08 0537) ?BP: 147/73 (05/08 0537) ?Pulse Rate: 73 (05/08 0537) ? ?Labs: ?Recent Labs  ?  08/15/21 ?0446 08/16/21 ?0410 08/17/21 ?0354  ?HGB 13.4 13.3 13.4  ?HCT 41.2 41.3 41.6  ?PLT 98* 109* 147*  ?LABPROT 33.8* 30.5* 23.0*  ?INR 3.4* 3.0* 2.1*  ?CREATININE <0.30* 0.30* <0.30*  ? ? ? ?CrCl cannot be calculated (This lab value cannot be used to calculate CrCl because it is not a number: <0.30). ? ? ?Assessment: ?22 yoF admitted on 5/2 for abdominal pain, constipation, metastatic colon cancer. Pharmacy is consulted to dose warfarin. She is on warfarin PTA for history of Afib. CT abdomen shows hepatic metastasis and resultant partial portal vein thrombus. CTa negative for PE.  ? ?PTA Warfarin dose: 10 mg daily except '5mg'$  on Monday/Wednesday/Saturday.  Last dose on 5/1 at 2200.  ?   ?INR 1.5, subtherapeutic on admission 5/2 ?- During previous admission, INR decreased from supratherapeutic 3.3 down to therapeutic range after holding x 2 days then lower doses '5mg'$ /2.'5mg'$   ? ?5/2 & 5/3 warfarin 10 mg x 2 days ?5/4, 5/5, 5/6 warfarin doses held ? ?Today, 08/17/2021: ?INR  2.1, therapeutic ?Warfarin 2.5 mg given yesterdat  ?CBC: Hgb 13.4, stable;  Plt low but stable  ?No bleeding or complications reported ?LFTs: WNL, Tbili 0.9 (5/7)  ?No major drug-drug interactions noted ?Diet: regular ?Weight: 52.3 kg ? ?Goal of Therapy:  ?INR 2-3 ?Monitor platelets by anticoagulation protocol: Yes ?  ?Plan:  ?Warfarin 2.5 mg po x 1 today ?Daily PT/INR for daily warfarin dose ?Monitor CBC and for signs and symptoms of bleeding. ? ? ?Royetta Asal, PharmD, BCPS ?08/17/2021 12:10 PM ? ? ? ?

## 2021-08-17 NOTE — Care Management Important Message (Signed)
Important Message ? ?Patient Details IM Letter given to the Patient. ?Name: Sabrina [name] ?MRN: [id number] ?Date of Birth: [birth year] ? ? ?Medicare Important Message Given:  Yes ? ? ? ? ?Kerin Salen ?08/17/2021, 11:19 AM ?

## 2021-08-17 NOTE — Progress Notes (Addendum)
IP PROGRESS NOTE ? ?Subjective:  ? ?[name] reports improvement in her abdominal pain.  Bowels are now moving.  She has received multiple enemas and laxatives. ? ?Objective: ?Vital signs in last 24 hours: ?Blood pressure (!) 147/73, pulse 73, temperature 98.3 ?F (36.8 ?C), temperature source Oral, resp. rate 14, height _0  (1.778 m), weight 53.3 kg, SpO2 96 %. ? ?Intake/Output from previous day: ?05/07 0701 - 05/08 0700 ?In: 6278.1 [P.O.:960; I.V.:5318.1] ?Out: 500 [Urine:500] ? ?Physical Exam: ? ?HEENT: No thrush ?Lungs: Clear to auscultation ?Cardiac: Regular rate and rhythm ?Abdomen: No mass, no hepatosplenomegaly, nontender ?Extremities: No leg edema ? ? ?Portacath/PICC-without erythema ? ?Lab Results: ?Recent Labs  ?  08/16/21 ?0410 08/17/21 ?0354  ?WBC 10.3 9.6  ?HGB 13.3 13.4  ?HCT 41.3 41.6  ?PLT 109* 147*  ? ? ?BMET ?Recent Labs  ?  08/16/21 ?0410 08/17/21 ?0354  ?NA 138 136  ?K 2.9* 4.2  ?CL 97* 99  ?CO2 34* 30  ?GLUCOSE 141* 115*  ?BUN 7* 10  ?CREATININE 0.30* <0.30*  ?CALCIUM 8.6* 8.7*  ? ? ?Lab Results  ?Component Value Date  ? CEA1 1.40 12/16/2020  ? CEA 1.57 02/16/2021  ? ? ?Studies/Results: ?No results found. ? ?Medications: I have reviewed the patient's current medications. ? 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, stage IIIc pT3pN2b ?Moderately differentiated adenocarcinoma, 9/30 lymph nodes positive, lymphovascular invasion present including large vessel extramural invasion, loss of MLH1 and PMS2, MLH1 hyper methylation present ?Incomplete colonoscopy secondary to stricturing at the sigmoid colon ?Virtual colonoscopy 03/13/2020- mass centered at the medial wall of the cecum and terminal ileum, adjacent ileocolic mesenteric adenopathy, nonspecific 3 mm right lower lobe nodule ?PET 09/62/8366 hypermetabolic cecal/terminal ileum mass with adjacent enlarged and hypermetabolic ileocolic lymph nodes, previously noted segment 7 liver lesion-not hypermetabolic-  benign etiology favored, dominant left lower lobe pulmonary nodule not hypermetabolic, other tiny nodules not hypermetabolic and below PET sensitivity ?Cycle 1 FOLFOX 05/12/2020 ?Cycle 2 FOLFOX 05/26/2020 ?Cycle 3 FOLFOX 06/09/2020 ?Cycle 4 FOLFOX 06/23/2020 ?Cycle 5 FOLFOX 07/07/2020 ?Cycle 12 FOLFOX 10/14/2020, oxaliplatin dose reduced beginning with cycle 9 ?CT 12/04/2020-development of peripheral enhancing hypoattenuating mass in segment 5/8, necrotic porta hepatis lymphadenopathy, similar appearance of previously visualized nonhypermetabolic subcapsular segment 7 lesion ?Ultrasound-guided biopsy right liver lesion 12/22/2020-adenocarcinoma consistent with a colon primary ?PET scan 12/30/2020-hypermetabolic hepatic lesion and portal adenopathy-increased in size compared to 12/04/2020 CT, small hypermetabolic left periaortic node ?Foundation 1-MSI high, tumor mutation burden 31, K-ras wild-type ?Cycle 1 Pembrolizumab 01/15/2021 ?Cycle 1 FOLFIRI 02/16/2021 ?Cycle 2 FOLFIRI 03/02/2021-Emend and Udenyca added ?Cycle 3 FOLFIRI 03/17/2021 ?Cycle 4 FOLFIRI 03/31/2021 ?Cycle 5 FOLFIRI 04/15/2021 ?CT abdomen/pelvis 04/23/2021-mild decrease in size of right hepatic lobe metastases, mild decrease in porta hepatis and portacaval lymphadenopathy, mild increase in left periaortic lymphadenopathy ?Cycle 6 FOLFIRI 04/28/2021 ?Cycle 7 FOLFIRI 05/12/2021 ?Cycle 8 FOLFIRI 05/26/2021 ?Cycle 9 FOLFIRI 06/09/2021 ?Cycle 10 FOLFIRI 06/30/2021 ?CTs 07/10/2021-decrease in size of right hepatic lobe metastatic lesion and periportal and retroperitoneal nodal disease.  No new or progressive findings. ?Cycle 11 FOLFIRI 07/14/2021 ?CT abdomen/pelvis 08/06/2021-enlarging right liver mass with progressive tumor thrombus in the portal vein, new or enlarging segment 7 lesion, progressive retroperitoneal lymphadenopathy ?CT abdomen/pelvis 08/11/2021-stable hepatic metastases and abdominal nodal metastases ?CT angiogram chest 08/11/2021-new left pulmonary nodules since PET  12/30/2020, stable right and left lower lobe nodules-likely benign, no evidence of pulmonary embolism ?Iron deficiency anemia secondary to #1 ?Right breast cancer, stage Ia, T1b, N0, ER positive, PR positive, HER2  positive status post a right lumpectomy and adjuvant radiation/Taxol/Herceptin, anastrozole starting 01/25/2019 ?Diabetes ?Atrial fibrillation-maintained on Coumadin, Coumadin dose adjusted 03/02/2021 ?Hypothyroidism ?Ongoing tobacco use ?Family history of breast cancer-negative genetic testing ?Oxaliplatin neuropathy ?Intermittent upper abdominal discomfort-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Admission 08/04/2021 with abdomen/back pain and failure to thrive ?Admission 08/11/2021 with constipation ?  ?[name] appears improved.  She has had improvement in her abdominal pain with adjustment of narcotic analgesics and resolution of constipation.  Recommend continuation of current pain medications.  Will need to be on a bowel regimen moving forward. ? ?She has metastatic colon cancer.  She was last treated with FOLFIRI on 07/14/2021.  There is clinical and radiologic evidence of disease progression.  Plan is to begin systemic therapy with pembrolizumab as an outpatient.  We will plan to schedule her as an outpatient in our office next week per her request. ? ?Recommendations: ?Continue bowel regimen ?Continue narcotic analgesics as needed for pain ?Continue Coumadin per pharmacy ?We will arrange outpatient follow-up in our office for pembrolizumab next week. ? ?Okay to discharge from our standpoint.  Outpatient follow-up will be scheduled. ? ? LOS: 5 days  ? ?Mikey Bussing, NP  ? ?08/17/2021, 7:53 AM ?Ms. Blazina was interviewed and examined.  The abdominal pain is much improved after having bowel movements.  She feels ready to go home.  She would like to wait until next week to begin pembrolizumab. ? ?Outpatient follow-up to be scheduled at the Cancer center for the week of 08/24/2021. ? ?I was present  for greater than 50% today's visit.  I performed medical decision making. ?

## 2021-08-17 NOTE — NC FL2 (Signed)
?[place] MEDICAID FL2 LEVEL OF CARE SCREENING TOOL  ?  ? ?IDENTIFICATION  ?Patient Name: ?Sabrina [name] Birthdate: 05-11-1950 Sex: female Admission Date (Current Location): ?08/11/2021  ?[place] and [place] Number: ? [place] ?  Facility and Address:  ?[facility],  [address] [place], [place] ?     Provider Number: ?[id number]  ?Attending Physician Name and Address:  ?Aline August, MD ? Relative Name and Phone Number:  ?Edwena Bunde Daughter [phone]  [phone]  Peterson Son [phone]  [phone] ?   ?Current Level of Care: ?Hospital Recommended Level of Care: ?[place] Prior Approval Number: ?  ? ?Date Approved/Denied: ?  PASRR Number: ?[id number] A ? ?Discharge Plan: ?SNF ?  ? ?Current Diagnoses: ?Patient Active Problem List  ? Diagnosis Date Noted  ? Constipation 08/11/2021  ? Generalized weakness 08/11/2021  ? Dehydration 08/04/2021  ? Failure to thrive in adult 08/04/2021  ? Cancer associated pain 08/04/2021  ? Anorexia 08/04/2021  ? Leukocytosis 08/04/2021  ? Pain in the abdomen 08/04/2021  ? Protein-calorie malnutrition, severe ([place]) 08/04/2021  ? Breast mass, right 08/04/2021  ? Goals of care, counseling/discussion 02/04/2021  ? Metastatic colon cancer to liver ([place]) 03/26/2020  ? Diabetes ([place])   ? Dysrhythmia   ? Hyperlipidemia   ? Personal history of chemotherapy   ? Personal history of radiation therapy   ? Iron deficiency anemia due to chronic blood loss 12/26/2019  ? Port-A-Cath in place 08/24/2018  ? Preop cardiovascular exam 07/05/2018  ? Genetic testing 07/05/2018  ? Family history of breast cancer   ? Family history of leukemia   ? Malignant neoplasm of upper-outer quadrant of right breast in female, estrogen receptor positive ([place]) 06/26/2018  ? Diabetes mellitus ([place]) 06/12/2018  ? Smoking 07/12/2017  ? Statin intolerance 12/08/2016  ? Long term (current) use of anticoagulants [Z79.01] 11/08/2016  ? Encounter for therapeutic drug monitoring  11/08/2016  ? Atrial fibrillation ([place]) [I48.91] 11/08/2016  ? Benign essential hypertension 08/25/2015  ? Hypothyroidism due to Hashimoto's thyroiditis 05/23/2015  ? Type 2 diabetes mellitus without complication, without long-term current use of insulin ([place]) 05/23/2015  ? Paroxysmal atrial fibrillation ([place]) 02/13/2015  ? Dyslipidemia 02/13/2015  ? ? ?Orientation RESPIRATION BLADDER Height & Weight   ?  ?Self, Time, Situation, Place ? Normal Continent Weight: 117 lb 8.1 oz (53.3 kg) ?Height:  '5\' 10"'$  (177.8 cm)  ?BEHAVIORAL SYMPTOMS/MOOD NEUROLOGICAL BOWEL NUTRITION STATUS  ?    Continent Diet (Regular diet)  ?AMBULATORY STATUS COMMUNICATION OF NEEDS Skin   ?Limited Assist Verbally Normal ?  ?  ?  ?    ?     ?     ? ? ?Personal Care Assistance Level of Assistance  ?Bathing, Dressing, Feeding Bathing Assistance: Limited assistance ?Feeding assistance: Independent ?Dressing Assistance: Limited assistance ?   ? ?Functional Limitations Info  ?Sight, Speech, Hearing Sight Info: Adequate ?Hearing Info: Adequate ?Speech Info: Adequate  ? ? ?SPECIAL CARE FACTORS FREQUENCY  ?PT (By licensed PT), OT (By licensed OT)   ?  ?PT Frequency: Minimumn 5x a week ?OT Frequency: Minimum 5x a week ?  ?  ?  ?   ? ? ?Contractures Contractures Info: Not present  ? ? ?Additional Factors Info  ?Code Status, Allergies Code Status Info: DNR ?Allergies Info: Atorvastatin   Meperidine ?  ?  ?  ?   ? ?Current Medications (08/17/2021):  This is the current hospital active medication list ?Current Facility-Administered Medications  ?Medication Dose Route Frequency Provider Last Rate  Last Admin  ? 0.9 %  sodium chloride infusion   Intravenous Continuous Dahal, Marlowe Aschoff, MD 75 mL/hr at 08/16/21 0627 New Bag at 08/16/21 0627  ? 0.9 % NaCl with KCl 40 mEq / L  infusion   Intravenous Continuous Nita Sells, MD 75 mL/hr at 08/17/21 0119 New Bag at 08/17/21 0119  ? acetaminophen (TYLENOL) tablet 650 mg  650 mg Oral Q6H PRN Lenore Cordia, MD   650  mg at 08/16/21 2156  ? Or  ? acetaminophen (TYLENOL) suppository 650 mg  650 mg Rectal Q6H PRN Lenore Cordia, MD      ? anastrozole (ARIMIDEX) tablet 1 mg  1 mg Oral Daily Zada Finders R, MD   1 mg at 08/17/21 1004  ? Chlorhexidine Gluconate Cloth 2 % PADS 6 each  6 each Topical Daily Lenore Cordia, MD   6 each at 08/16/21 1256  ? dronabinol (MARINOL) capsule 5 mg  5 mg Oral BID AC Nita Sells, MD   5 mg at 08/16/21 1705  ? feeding supplement (BOOST / RESOURCE BREEZE) liquid 1 Container  1 Container Oral TID BM Terrilee Croak, MD   1 Container at 08/16/21 2100  ? flecainide (TAMBOCOR) tablet 100 mg  100 mg Oral QHS Zada Finders R, MD   100 mg at 08/16/21 2125  ? hydrALAZINE (APRESOLINE) injection 5 mg  5 mg Intravenous Q4H PRN Rise Patience, MD   5 mg at 08/16/21 2121  ? HYDROmorphone (DILAUDID) injection 1 mg  1 mg Intravenous Q4H PRN Nita Sells, MD      ? levothyroxine (SYNTHROID) tablet 100 mcg  100 mcg Oral QHS Lenore Cordia, MD   100 mcg at 08/16/21 2120  ? MEDLINE mouth rinse  15 mL Mouth Rinse BID Nita Sells, MD   15 mL at 08/17/21 1142  ? melatonin tablet 9 mg  9 mg Oral QHS PRN Lenore Cordia, MD   9 mg at 08/12/21 2202  ? metoprolol tartrate (LOPRESSOR) tablet 12.5 mg  12.5 mg Oral BID Nita Sells, MD   12.5 mg at 08/17/21 1004  ? multivitamin with minerals tablet 1 tablet  1 tablet Oral Daily Dahal, Marlowe Aschoff, MD   1 tablet at 08/17/21 1004  ? ondansetron (ZOFRAN) tablet 4 mg  4 mg Oral Q6H PRN Lenore Cordia, MD      ? Or  ? ondansetron (ZOFRAN) injection 4 mg  4 mg Intravenous Q6H PRN Lenore Cordia, MD      ? oxyCODONE (Oxy IR/ROXICODONE) immediate release tablet 5-10 mg  5-10 mg Oral Q4H PRN Ladell Pier, MD   10 mg at 08/17/21 1007  ? pantoprazole (PROTONIX) EC tablet 40 mg  40 mg Oral BID PRN Lenore Cordia, MD   40 mg at 08/15/21 1014  ? potassium chloride SA (KLOR-CON M) CR tablet 40 mEq  40 mEq Oral BID Nita Sells, MD   40 mEq at  08/17/21 1004  ? senna-docusate (Senokot-S) tablet 1 tablet  1 tablet Oral BID Lenore Cordia, MD   1 tablet at 08/17/21 1004  ? sodium chloride flush (NS) 0.9 % injection 10-40 mL  10-40 mL Intracatheter Q12H Lenore Cordia, MD   10 mL at 08/15/21 2056  ? sodium chloride flush (NS) 0.9 % injection 10-40 mL  10-40 mL Intracatheter PRN Zada Finders R, MD      ? sodium chloride flush (NS) 0.9 % injection 3 mL  3 mL Intravenous Q12H Posey Pronto,  Cleaster Corin, MD   3 mL at 08/15/21 2057  ? sodium phosphate (FLEET) 7-19 GM/118ML enema 1 enema  1 enema Rectal QHS Nita Sells, MD   1 enema at 08/15/21 2106  ? Warfarin - Pharmacist Dosing Inpatient   Does not apply q1600 Randa Spike, RPH      ? witch hazel-glycerin (TUCKS) pad   Topical PRN Nita Sells, MD      ? ? ? ?Discharge Medications: ?Please see discharge summary for a list of discharge medications. ? ?Relevant Imaging Results: ? ?Relevant Lab Results: ? ? ?Additional Information ?SSN [id number] ? ?Ross Ludwig, LCSW ? ? ? ? ?

## 2021-08-17 NOTE — Discharge Summary (Addendum)
Physician Discharge Summary  ?[name] ONG:[id number] DOB: [birth year] DOA: 08/11/2021 ? ?PCP: [name], NP ? ?Admit date: 08/11/2021 ?Discharge date: 08/17/2021 ? ?Admitted From: Home ?Disposition: SNF ? ?Recommendations for Outpatient Follow-up:  ?Follow up with SNF provider at earliest convenience  ?Outpatient follow up with Oncology ?Follow up in ED if symptoms worsen or new appear ? ? ?Home Health: No ?Equipment/Devices: None ? ?Discharge Condition: Stable ?CODE STATUS: Full ?Diet recommendation: Heart healthy ? ?Brief/Interim Summary: ?71 y.o. female with PMH significant for DM2, HTN, HLD, A-fib on Coumadin, hypothyroidism, depression, chronic blood loss/iron deficiency anemia, Colon cancer s/p right colectomy with hepatic and abdominal nodal metastasis, right breast cancer status postlumpectomy, adjuvant radiation and chemo with recently found new right breast mass, stable portal vein thrombosis, recent admission from 08/04/2021-08/08/2021 for intractable abdominal pain and back pain related to metastatic disease treated with IV pain medications and transition to oral oxycodone presented on 08/11/2021 with worsening generalized abdominal pain and constipation.  On presentation, CT abdomen/pelvis with contrast showed large stool burden without evidence of bowel obstruction.  It also showed known intra-abdominal metastasis and portal vein thrombosis as seen previous hospitalization.  CT chest did not show any evidence of pulmonary embolism but showed a new subcentimeter left upper and left lower lobe pulmonary nodules with stable right lower left lower lobe pulmonary nodules.  She was started on laxatives.  Oncology was consulted.  During the hospitalization, her constipation has much improved and she is currently having bowel movements.  PT recommended SNF placement.  She will be discharged to SNF once bed is available.  Oncology has cleared the patient for discharge. ? ?Discharge Diagnoses:  ? ?Acute on  chronic constipation ?-CT abdomen showed large stool burden in the colon.  Treated with aggressive bowel regimen including enema during the hospitalization. ?-Currently having bowel movements.  Continue Senokot and MiraLAX on discharge.  Outpatient follow-up. ? ?Metastatic colon cancer to liver ?Cancer associated pain ?Failure to thrive/generalized weakness ?-Continue pain regimen on discharge.  Outpatient follow-up with Dr. Alen Blew ?-PT recommended SNF placement.   ? ?Paroxysmal A-fib ?-Continue flecainide and Coumadin.  Outpatient follow-up with PCP with INR results ? ?Diabetes mellitus type 2 ?-Continue carb modified diet.  Resume metformin ? ?Hypothyroidism ?-Continue Synthroid ? ?Hypokalemia ?-Improved.  Continue supplementation on discharge ? ?Anemia of chronic disease/iron deficiency anemia due to chronic blood loss ?-Hemoglobin stable.  Outpatient follow-up ? ?Leukocytosis ?-Resolved ? ?Discharge Instructions ? ?Discharge Instructions   ? ? Diet - low sodium heart healthy   Complete by: As directed ?  ? Increase activity slowly   Complete by: As directed ?  ? ?  ? ?Allergies as of 08/17/2021   ? ?   Reactions  ? Atorvastatin Other (See Comments)  ? Muscle aches  ? Meperidine Nausea And Vomiting  ? ?  ? ?  ?Medication List  ?  ? ?STOP taking these medications   ? ?HYDROcodone-acetaminophen 5-325 MG tablet ?Commonly known as: NORCO/VICODIN ?  ? ?  ? ?TAKE these medications   ? ?anastrozole 1 MG tablet ?Commonly known as: ARIMIDEX ?TAKE 1 TABLET DAILY ?  ?flecainide 100 MG tablet ?Commonly known as: TAMBOCOR ?Take 1 tablet (100 mg total) by mouth at bedtime. ?  ?levothyroxine 100 MCG tablet ?Commonly known as: SYNTHROID ?Take 100 mcg by mouth at bedtime. ?  ?lidocaine-prilocaine cream ?Commonly known as: EMLA ?Apply 1 application. topically as needed. ?What changed: reasons to take this ?  ?Melatonin 3 MG Caps ?Take 9 mg by  mouth at bedtime as needed (for sleep). ?  ?metFORMIN 500 MG tablet ?Commonly known as:  GLUCOPHAGE ?Take 500 mg by mouth 2 (two) times daily with a meal. ?  ?metoprolol tartrate 25 MG tablet ?Commonly known as: LOPRESSOR ?Take 0.5 tablets (12.5 mg total) by mouth 2 (two) times daily. ?  ?oxyCODONE 5 MG immediate release tablet ?Commonly known as: Roxicodone ?Take 1 tablet (5 mg total) by mouth every 6 (six) hours as needed for moderate pain or severe pain. ?What changed:  ?when to take this ?reasons to take this ?Another medication with the same name was removed. Continue taking this medication, and follow the directions you see here. ?  ?pantoprazole 40 MG tablet ?Commonly known as: Protonix ?Take 1 tablet (40 mg total) by mouth 2 (two) times daily as needed (for reflux). ?  ?polyethylene glycol 17 g packet ?Commonly known as: MiraLax ?Take 17 g by mouth 2 (two) times daily. ?  ?potassium chloride SA 20 MEQ tablet ?Commonly known as: KLOR-CON M ?Take 1 tablet (20 mEq total) by mouth daily. ?  ?prochlorperazine 10 MG tablet ?Commonly known as: COMPAZINE ?Take 1 tablet (10 mg total) by mouth every 6 (six) hours as needed for nausea or vomiting. ?  ?senna-docusate 8.6-50 MG tablet ?Commonly known as: Senokot-S ?Take 1 tablet by mouth 2 (two) times daily. ?  ?warfarin 10 MG tablet ?Commonly known as: COUMADIN ?Take as directed. If you are unsure how to take this medication, talk to your nurse or doctor. ?Original instructions: Take 1-2 tablets Daily or as prescribed by [facility]madin Clinic ?What changed:  ?how much to take ?how to take this ?when to take this ?additional instructions ?  ? ?  ? ? ? ? Follow-up Information   ? ? [name], NP. Schedule an appointment as soon as possible for a visit in 1 week(s).   ?Specialty: Family Medicine ?Contact information: ?[address] ?[place] [address] ?[phone] ? ? ?  ?  ? ? Park Liter, MD .   ?Specialty: Cardiology ?Contact information: ?[address] ?[place] [address] ?[phone] ? ? ?  ?  ? ? Ladell Pier, MD. Schedule an  appointment as soon as possible for a visit in 1 week(s).   ?Specialty: Oncology ?Contact information: ?[place] ?[place] [address] ?[phone] ? ? ?  ?  ? ?  ?  ? ?  ? ?Allergies  ?Allergen Reactions  ? Atorvastatin Other (See Comments)  ?  Muscle aches  ? Meperidine Nausea And Vomiting  ? ? ?Consultations: ?Oncology ? ? ?Procedures/Studies: ?CT Angio Chest PE W and/or Wo Contrast ? ?Result Date: 08/11/2021 ?CLINICAL DATA:  Weakness, history of metastatic colon cancer EXAM: CT ANGIOGRAPHY CHEST WITH CONTRAST TECHNIQUE: Multidetector CT imaging of the chest was performed using the standard protocol during bolus administration of intravenous contrast. Multiplanar CT image reconstructions and MIPs were obtained to evaluate the vascular anatomy. RADIATION DOSE REDUCTION: This exam was performed according to the departmental dose-optimization program which includes automated exposure control, adjustment of the mA and/or kV according to patient size and/or use of iterative reconstruction technique. CONTRAST:  78m OMNIPAQUE IOHEXOL 350 MG/ML SOLN COMPARISON:  03/20/2020, 12/30/2020, 08/11/2021 FINDINGS: Cardiovascular: This is a technically adequate evaluation of the pulmonary vasculature. No filling defects or pulmonary emboli. The heart is unremarkable. Trace pericardial fluid. No evidence of thoracic aortic aneurysm or dissection. Mild atherosclerosis of the aorta, with moderate atherosclerosis throughout the coronary vasculature. Left chest wall port via  subclavian approach tip within the superior vena cava at the atriocaval junction. Mediastinum/Nodes: No enlarged mediastinal, hilar, or axillary lymph nodes. Thyroid gland, trachea, and esophagus demonstrate no significant findings. Lungs/Pleura: Since the 12/30/2020 PET scan, multiple pulmonary nodules have developed. Nodules are as follows: Left upper lobe, image 41/6, 7 x 6 mm. Left upper lobe, image 88/6, 7 x 7 mm. Left lower lobe, image 92/6,  3 x 3 mm. There are other stable bilateral pulmonary nodules, measuring 3 mm in the right lower lobe image 92/6, and measuring 10 mm in the left lower lobe image 77/6. These nodules have been stable since 2021 and are

## 2021-08-17 NOTE — TOC Transition Note (Signed)
Transition of Care (TOC) - CM/SW Discharge Note ? ? ?Patient Details  ?Name: Sabrina [name] ?MRN: [id number] ?Date of Birth: [birth year] ? ?Transition of Care (TOC) CM/SW Contact:  ?Ross Ludwig, LCSW ?Phone Number: ?08/17/2021, 12:56 PM ? ? ?Clinical Narrative:    ? ?11:30am  CSW followed up with patient and told her that [facility] can accept patient if she is interested in going.  Patient stated she has decided to go to [facility] today, and she asked what time does she need to be there by, CSW informed her by 5pm.  Per [facility] they can accept patient today, however she has to bring her anastrozole 1 MG tablet (arimidex) from home because it is an expensive medication.  CSW relayed information to patient.  Patient states she will have her family transport her to SNF.  CSW updated [facility] they said to make sure she gets there before 9pm.  CSW advised patient to try to get to SNF before 7pm or 8pm.  Patient expressed understanding, she is working on getting a ride set up for her to go to SNF.  Patient will be going to room 607 nurse to call report to [phone] ext. [phone]. ? ? ?Final next level of care: [place] ?Barriers to Discharge: Barriers Resolved ? ? ?Patient Goals and CMS Choice ?Patient states their goals for this hospitalization and ongoing recovery are:: To return back home after rehab. ?CMS Medicare.gov Compare Post Acute Care list provided to:: Patient ?Choice offered to / list presented to : Patient ? ?Discharge Placement ?PASRR number recieved: 08/17/21 ?           ?Patient chooses bed at: [facility] [place] ?Patient to be transferred to facility by: Family to transport ?Name of family member notified: Patient to notify her famiy. ?Patient and family notified of of transfer: 08/17/21 ? ?Discharge Plan and Services ?  ?  ?           ?  ?  ?  ?  ?  ?  ?  ?  ?  ?  ? ?Social Determinants of Health (SDOH) Interventions ?  ? ? ?Readmission Risk Interventions ?   ? View : No data to  display.  ?  ?  ?  ? ? ? ? ? ?

## 2021-08-17 NOTE — TOC Progression Note (Addendum)
Transition of Care (TOC) - Progression Note  ? ? ?Patient Details  ?Name: Sabrina [name] ?MRN: [id number] ?Date of Birth: 1951/01/08 ? ?Transition of Care (TOC) CM/SW Contact  ?Ross Ludwig, LCSW ?Phone Number: ?08/17/2021, 12:56 PM ? ?Clinical Narrative:    ? ?10:30am  Initially patient was wanting to go home with home health services.  Patient's daughter was in the room and she was upset because patient did not want to go to SNF.  CSW explained to patient's daughter that it is patient's right to choose if she goes to SNF or home with home health services.  Patient's daughter was not happy with that answer, and she yelled at this CSW and said "I don't want to talk to you anymore today!  Are you going to take care of my mother at her home?  I am the one looking after her!!"  CSW explained again that is patient's right to make decisions on what she wants to do.  CSW offered to have home health set up and palliative.  Patient's daughter then started crying and yelling saying, "I am going to leave!  Don't talk to me anymore!"   ? ?After patient's daughter left, CSW spoke to patient and explained difference between SNF and home health.  Patient asked if she does go to SNF can she go to [facility], CSW told her it will depend on bed availability.  Patient then asked, can my family transport me to SNF, CSW informed her yes they can, as long as she gets to SNF before 5pm that should be fine.   ? ?Patient stated she wanted to stop by home to see her dogs first.  CSW said yes that would be fine.  Patient said she was going to think about it, and to have CSW come back later to check on decision. ? ? ?Expected Discharge Plan: Home/Self Care ?Barriers to Discharge: Barriers Resolved ? ?Expected Discharge Plan and Services ?Expected Discharge Plan: Home/Self Care ?  ?  ?  ?Living arrangements for the past 2 months: [place] ?Expected Discharge Date: 08/17/21               ?  ?  ?  ?  ?  ?  ?  ?  ?  ?   ? ? ?Social Determinants of Health (SDOH) Interventions ?  ? ?Readmission Risk Interventions ?   ? View : No data to display.  ?  ?  ?  ? ? ?

## 2021-08-18 ENCOUNTER — Telehealth: Payer: Self-pay

## 2021-08-18 ENCOUNTER — Other Ambulatory Visit: Payer: Medicare Other

## 2021-08-18 ENCOUNTER — Ambulatory Visit: Payer: Medicare Other | Admitting: Nurse Practitioner

## 2021-08-18 ENCOUNTER — Ambulatory Visit: Payer: Medicare Other

## 2021-08-18 NOTE — Telephone Encounter (Signed)
Pt's INR overdue; however, pt had INR drawn on 08/17/21. Called pt to discuss INR results and Warfarin dosage, no answer. Left message to call back as soon as possible.  ?

## 2021-08-20 ENCOUNTER — Telehealth: Payer: Self-pay | Admitting: *Deleted

## 2021-08-20 NOTE — Telephone Encounter (Signed)
Daughter, Edwena Bunde was contacted by telephone to verify understanding of discharge instructions status post their most recent discharge from the hospital on the date:  08/17/21. Patient was transferred to [facility] in [place] for rehab. ?She reports she is eating well, having PT and bowels are moving. She wants to be home, but is adjusting slowly. Unsure how long she will be there. ?Transportation to appointments were confirmed for the patient as being self/caregiver. Daughter will call if she is not able to keep her appointment on 09/03/21. ? ?

## 2021-08-21 ENCOUNTER — Telehealth: Payer: Self-pay

## 2021-08-21 NOTE — Telephone Encounter (Signed)
Called and spoke with pt's daughter, Estill Bamberg. Pt was discharged to [facility] on Monday. Made daughter aware that facility will monitor INR/Warfarin dosage; however, if pt is discharged, we will resume anticoagulation management.  ?

## 2021-09-02 ENCOUNTER — Other Ambulatory Visit: Payer: Self-pay | Admitting: Oncology

## 2021-09-03 ENCOUNTER — Inpatient Hospital Stay (HOSPITAL_BASED_OUTPATIENT_CLINIC_OR_DEPARTMENT_OTHER): Payer: Medicare Other | Admitting: Nurse Practitioner

## 2021-09-03 ENCOUNTER — Telehealth: Payer: Self-pay

## 2021-09-03 ENCOUNTER — Other Ambulatory Visit (HOSPITAL_BASED_OUTPATIENT_CLINIC_OR_DEPARTMENT_OTHER): Payer: Self-pay

## 2021-09-03 ENCOUNTER — Encounter: Payer: Self-pay | Admitting: Hematology and Oncology

## 2021-09-03 ENCOUNTER — Inpatient Hospital Stay: Payer: Medicare Other

## 2021-09-03 ENCOUNTER — Inpatient Hospital Stay: Payer: Medicare Other | Attending: Nurse Practitioner

## 2021-09-03 ENCOUNTER — Other Ambulatory Visit: Payer: Self-pay

## 2021-09-03 ENCOUNTER — Telehealth: Payer: Self-pay | Admitting: *Deleted

## 2021-09-03 ENCOUNTER — Encounter: Payer: Self-pay | Admitting: Oncology

## 2021-09-03 ENCOUNTER — Encounter: Payer: Self-pay | Admitting: Nurse Practitioner

## 2021-09-03 ENCOUNTER — Encounter: Payer: Self-pay | Admitting: *Deleted

## 2021-09-03 VITALS — BP 141/70 | HR 91 | Temp 98.1°F

## 2021-09-03 VITALS — BP 113/67 | HR 101 | Temp 98.1°F | Resp 18

## 2021-09-03 DIAGNOSIS — Z170 Estrogen receptor positive status [ER+]: Secondary | ICD-10-CM | POA: Diagnosis not present

## 2021-09-03 DIAGNOSIS — C182 Malignant neoplasm of ascending colon: Secondary | ICD-10-CM

## 2021-09-03 DIAGNOSIS — C787 Secondary malignant neoplasm of liver and intrahepatic bile duct: Secondary | ICD-10-CM | POA: Insufficient documentation

## 2021-09-03 DIAGNOSIS — E441 Mild protein-calorie malnutrition: Secondary | ICD-10-CM

## 2021-09-03 DIAGNOSIS — C189 Malignant neoplasm of colon, unspecified: Secondary | ICD-10-CM

## 2021-09-03 DIAGNOSIS — Z7901 Long term (current) use of anticoagulants: Secondary | ICD-10-CM | POA: Insufficient documentation

## 2021-09-03 DIAGNOSIS — I4891 Unspecified atrial fibrillation: Secondary | ICD-10-CM | POA: Insufficient documentation

## 2021-09-03 DIAGNOSIS — Z79811 Long term (current) use of aromatase inhibitors: Secondary | ICD-10-CM | POA: Insufficient documentation

## 2021-09-03 DIAGNOSIS — C50411 Malignant neoplasm of upper-outer quadrant of right female breast: Secondary | ICD-10-CM

## 2021-09-03 DIAGNOSIS — E039 Hypothyroidism, unspecified: Secondary | ICD-10-CM | POA: Insufficient documentation

## 2021-09-03 DIAGNOSIS — D509 Iron deficiency anemia, unspecified: Secondary | ICD-10-CM | POA: Insufficient documentation

## 2021-09-03 DIAGNOSIS — C180 Malignant neoplasm of cecum: Secondary | ICD-10-CM | POA: Diagnosis present

## 2021-09-03 DIAGNOSIS — C50911 Malignant neoplasm of unspecified site of right female breast: Secondary | ICD-10-CM | POA: Insufficient documentation

## 2021-09-03 DIAGNOSIS — E119 Type 2 diabetes mellitus without complications: Secondary | ICD-10-CM | POA: Insufficient documentation

## 2021-09-03 DIAGNOSIS — R634 Abnormal weight loss: Secondary | ICD-10-CM | POA: Insufficient documentation

## 2021-09-03 DIAGNOSIS — R5383 Other fatigue: Secondary | ICD-10-CM

## 2021-09-03 DIAGNOSIS — Z5112 Encounter for antineoplastic immunotherapy: Secondary | ICD-10-CM | POA: Diagnosis present

## 2021-09-03 DIAGNOSIS — G620 Drug-induced polyneuropathy: Secondary | ICD-10-CM | POA: Diagnosis not present

## 2021-09-03 LAB — CBC WITH DIFFERENTIAL (CANCER CENTER ONLY)
Abs Immature Granulocytes: 0.06 10*3/uL (ref 0.00–0.07)
Basophils Absolute: 0 10*3/uL (ref 0.0–0.1)
Basophils Relative: 0 %
Eosinophils Absolute: 0 10*3/uL (ref 0.0–0.5)
Eosinophils Relative: 0 %
HCT: 41.7 % (ref 36.0–46.0)
Hemoglobin: 13.4 g/dL (ref 12.0–15.0)
Immature Granulocytes: 1 %
Lymphocytes Relative: 9 %
Lymphs Abs: 1.1 10*3/uL (ref 0.7–4.0)
MCH: 28.5 pg (ref 26.0–34.0)
MCHC: 32.1 g/dL (ref 30.0–36.0)
MCV: 88.7 fL (ref 80.0–100.0)
Monocytes Absolute: 0.7 10*3/uL (ref 0.1–1.0)
Monocytes Relative: 7 %
Neutro Abs: 9.4 10*3/uL — ABNORMAL HIGH (ref 1.7–7.7)
Neutrophils Relative %: 83 %
Platelet Count: 154 10*3/uL (ref 150–400)
RBC: 4.7 MIL/uL (ref 3.87–5.11)
RDW: 16.8 % — ABNORMAL HIGH (ref 11.5–15.5)
WBC Count: 11.3 10*3/uL — ABNORMAL HIGH (ref 4.0–10.5)
nRBC: 0 % (ref 0.0–0.2)

## 2021-09-03 LAB — CMP (CANCER CENTER ONLY)
ALT: 16 U/L (ref 0–44)
AST: 22 U/L (ref 15–41)
Albumin: 3.7 g/dL (ref 3.5–5.0)
Alkaline Phosphatase: 222 U/L — ABNORMAL HIGH (ref 38–126)
Anion gap: 11 (ref 5–15)
BUN: 17 mg/dL (ref 8–23)
CO2: 31 mmol/L (ref 22–32)
Calcium: 9.5 mg/dL (ref 8.9–10.3)
Chloride: 95 mmol/L — ABNORMAL LOW (ref 98–111)
Creatinine: 0.44 mg/dL (ref 0.44–1.00)
GFR, Estimated: >60 mL/min (ref >60)
Glucose, Bld: 195 mg/dL — ABNORMAL HIGH (ref 70–99)
Potassium: 3.8 mmol/L (ref 3.5–5.1)
Sodium: 137 mmol/L (ref 135–145)
Total Bilirubin: 1.2 mg/dL (ref 0.3–1.2)
Total Protein: 6.5 g/dL (ref 6.5–8.1)

## 2021-09-03 LAB — PROTIME-INR
INR: 2.2 — ABNORMAL HIGH (ref 0.8–1.2)
Prothrombin Time: 24 seconds — ABNORMAL HIGH (ref 11.4–15.2)

## 2021-09-03 LAB — TSH: TSH: 0.889 u[IU]/mL (ref 0.350–4.500)

## 2021-09-03 MED ORDER — SODIUM CHLORIDE 0.9 % IV SOLN: Freq: Once | INTRAVENOUS | Status: AC

## 2021-09-03 MED ORDER — SODIUM CHLORIDE 0.9 % IV SOLN
200.0000 mg | Freq: Once | INTRAVENOUS | Status: AC
  Administered 2021-09-03: 200 mg via INTRAVENOUS
  Filled 2021-09-03: qty 8

## 2021-09-03 MED ORDER — SODIUM CHLORIDE 0.9% FLUSH
10.0000 mL | INTRAVENOUS | Status: DC | PRN
  Administered 2021-09-03: 10 mL

## 2021-09-03 MED ORDER — HEPARIN SOD (PORK) LOCK FLUSH 100 UNIT/ML IV SOLN
500.0000 [IU] | Freq: Once | INTRAVENOUS | Status: AC | PRN
  Administered 2021-09-03: 500 [IU]

## 2021-09-03 MED ORDER — OXYCODONE HCL 10 MG PO TABS
10.0000 mg | ORAL_TABLET | ORAL | 0 refills | Status: DC | PRN
  Filled 2021-09-03: qty 40, 3d supply, fill #0
  Filled 2021-09-03: qty 15, 2d supply, fill #0

## 2021-09-03 MED ORDER — OXYCODONE HCL 10 MG PO TABS: 10.0000 mg | ORAL_TABLET | ORAL | 0 refills | Status: DC | PRN

## 2021-09-03 MED ORDER — MORPHINE SULFATE (PF) 2 MG/ML IV SOLN
2.0000 mg | Freq: Once | INTRAVENOUS | Status: AC
  Administered 2021-09-03: 2 mg via INTRAVENOUS
  Filled 2021-09-03: qty 1

## 2021-09-03 NOTE — Progress Notes (Signed)
Sabrina Rams, NP notified Pt fell last night in bathroom (slid from chair to floor) sliding on bottom and back against the door. Having upper mid belly pain of 12 5 mg of pain medication at oxycodone 5 mg 6:30 and again 10 mg at 9:30 oxycodone. Drew labs pt complained of being faint, breaking out in a sweat.   Ned Card, NP to bedside assessing patient. Oxygen via [place] added for sat of 87 2L

## 2021-09-03 NOTE — Progress Notes (Signed)
Patient seen by Ned Card NP today  Vitals are not all within treatment parameters. Tachycardia at 111/min.  Labs reviewed by Ned Card NP and are within treatment parameters.  Per physician team, patient is ready for treatment and there are NO modifications to the treatment plan.

## 2021-09-03 NOTE — Telephone Encounter (Signed)
Notified by [facility] in [place] that they do not have oxycodone 5 mg or 10 in stock. They will cancel the prescription. Left VM for daughter with this information and to contact pharmacies in her area to determine who has this in stock and let office know.

## 2021-09-03 NOTE — Progress Notes (Signed)
Pharmacist Chemotherapy Monitoring - Initial Assessment    Anticipated start date: 09/03/21   The following has been reviewed per standard work regarding the patient's treatment regimen: The patient's diagnosis, treatment plan and drug doses, and organ/hematologic function Lab orders and baseline tests specific to treatment regimen  The treatment plan start date, drug sequencing, and pre-medications Prior authorization status  Patient's documented medication list, including drug-drug interaction screen and prescriptions for anti-emetics and supportive care specific to the treatment regimen The drug concentrations, fluid compatibility, administration routes, and timing of the medications to be used The patient's access for treatment and lifetime cumulative dose history, if applicable  The patient's medication allergies and previous infusion related reactions, if applicable   Changes made to treatment plan:  TSH ordered  by nursing  Follow up needed:  N/A   Patrica Duel, RPH, 09/03/2021  1:00 PM

## 2021-09-03 NOTE — Patient Instructions (Signed)
[facility]   ?Discharge Instructions: ?Thank you for choosing [facility] to provide your oncology and hematology care.  ? ?If you have a lab appointment with the Cancer Center, please go directly to the Cancer Center and check in at the registration area. ?  ?Wear comfortable clothing and clothing appropriate for easy access to any Portacath or PICC line.  ? ?We strive to give you quality time with your provider. You may need to reschedule your appointment if you arrive late (15 or more minutes).  Arriving late affects you and other patients whose appointments are after yours.  Also, if you miss three or more appointments without notifying the office, you may be dismissed from the clinic at the provider?s discretion.    ?  ?For prescription refill requests, have your pharmacy contact our office and allow 72 hours for refills to be completed.   ? ?Today you received the following chemotherapy and/or immunotherapy agents Keytruda    ?  ?To help prevent nausea and vomiting after your treatment, we encourage you to take your nausea medication as directed. ? ?BELOW ARE SYMPTOMS THAT SHOULD BE REPORTED IMMEDIATELY: ?*FEVER GREATER THAN 100.4 F (38 ?C) OR HIGHER ?*CHILLS OR SWEATING ?*NAUSEA AND VOMITING THAT IS NOT CONTROLLED WITH YOUR NAUSEA MEDICATION ?*UNUSUAL SHORTNESS OF BREATH ?*UNUSUAL BRUISING OR BLEEDING ?*URINARY PROBLEMS (pain or burning when urinating, or frequent urination) ?*BOWEL PROBLEMS (unusual diarrhea, constipation, pain near the anus) ?TENDERNESS IN MOUTH AND THROAT WITH OR WITHOUT PRESENCE OF ULCERS (sore throat, sores in mouth, or a toothache) ?UNUSUAL RASH, SWELLING OR PAIN  ?UNUSUAL VAGINAL DISCHARGE OR ITCHING  ? ?Items with * indicate a potential emergency and should be followed up as soon as possible or go to the Emergency Department if any problems should occur. ? ?Please show the CHEMOTHERAPY ALERT CARD or IMMUNOTHERAPY ALERT CARD at check-in to the  Emergency Department and triage nurse. ? ?Should you have questions after your visit or need to cancel or reschedule your appointment, please contact [facility]  Dept: [phone]  and follow the prompts.  Office hours are 8:00 a.m. to 4:30 p.m. Monday - Friday. Please note that voicemails left after 4:00 p.m. may not be returned until the following business day.  We are closed weekends and major holidays. You have access to a nurse at all times for urgent questions. Please call the main number to the clinic Dept: [phone] and follow the prompts. ? ? ?For any non-urgent questions, you may also contact your provider using MyChart. We now offer e-Visits for anyone 18 and older to request care online for non-urgent symptoms. For details visit [email or web address]. ?  ?Also download the MyChart app! Go to the app store, search "MyChart", open the app, select [facility], and log in with your MyChart username and password. ? ?Due to Covid, a mask is required upon entering the hospital/clinic. If you do not have a mask, one will be given to you upon arrival. For doctor visits, patients may have 1 support person aged 18 or older with them. For treatment visits, patients cannot have anyone with them due to current Covid guidelines and our immunocompromised population.  ? ?Pembrolizumab injection ?What is this medication? ?PEMBROLIZUMAB (pem broe liz ue mab) is a monoclonal antibody. It is used to treat certain types of cancer. ?This medicine may be used for other purposes; ask your health care provider or pharmacist if you have questions. ?COMMON BRAND NAME(S): Keytruda ?  What should I tell my care team before I take this medication? ?They need to know if you have any of these conditions: ?autoimmune diseases like Crohn's disease, ulcerative colitis, or lupus ?have had or planning to have an allogeneic stem cell transplant (uses someone else's stem cells) ?history of organ  transplant ?history of chest radiation ?nervous system problems like myasthenia gravis or Guillain-Barre syndrome ?an unusual or allergic reaction to pembrolizumab, other medicines, foods, dyes, or preservatives ?pregnant or trying to get pregnant ?breast-feeding ?How should I use this medication? ?This medicine is for infusion into a vein. It is given by a health care professional in a hospital or clinic setting. ?A special MedGuide will be given to you before each treatment. Be sure to read this information carefully each time. ?Talk to your pediatrician regarding the use of this medicine in children. While this drug may be prescribed for children as young as 6 months for selected conditions, precautions do apply. ?Overdosage: If you think you have taken too much of this medicine contact a poison control center or emergency room at once. ?NOTE: This medicine is only for you. Do not share this medicine with others. ?What if I miss a dose? ?It is important not to miss your dose. Call your doctor or health care professional if you are unable to keep an appointment. ?What may interact with this medication? ?Interactions have not been studied. ?This list may not describe all possible interactions. Give your health care provider a list of all the medicines, herbs, non-prescription drugs, or dietary supplements you use. Also tell them if you smoke, drink alcohol, or use illegal drugs. Some items may interact with your medicine. ?What should I watch for while using this medication? ?Your condition will be monitored carefully while you are receiving this medicine. ?You may need blood work done while you are taking this medicine. ?Do not become pregnant while taking this medicine or for 4 months after stopping it. Women should inform their doctor if they wish to become pregnant or think they might be pregnant. There is a potential for serious side effects to an unborn child. Talk to your health care professional or  pharmacist for more information. Do not breast-feed an infant while taking this medicine or for 4 months after the last dose. ?What side effects may I notice from receiving this medication? ?Side effects that you should report to your doctor or health care professional as soon as possible: ?allergic reactions like skin rash, itching or hives, swelling of the face, lips, or tongue ?bloody or black, tarry ?breathing problems ?changes in vision ?chest pain ?chills ?confusion ?constipation ?cough ?diarrhea ?dizziness or feeling faint or lightheaded ?fast or irregular heartbeat ?fever ?flushing ?joint pain ?low blood counts - this medicine may decrease the number of white blood cells, red blood cells and platelets. You may be at increased risk for infections and bleeding. ?muscle pain ?muscle weakness ?pain, tingling, numbness in the hands or feet ?persistent headache ?redness, blistering, peeling or loosening of the skin, including inside the mouth ?signs and symptoms of high blood sugar such as dizziness; dry mouth; dry skin; fruity breath; nausea; stomach pain; increased hunger or thirst; increased urination ?signs and symptoms of kidney injury like trouble passing urine or change in the amount of urine ?signs and symptoms of liver injury like dark urine, light-colored stools, loss of appetite, nausea, right upper belly pain, yellowing of the eyes or skin ?sweating ?swollen lymph nodes ?weight loss ?Side effects that usually do not require   medical attention (report to your doctor or health care professional if they continue or are bothersome): ?decreased appetite ?hair loss ?tiredness ?This list may not describe all possible side effects. Call your doctor for medical advice about side effects. You may report side effects to FDA at 1-800-FDA-1088. ?Where should I keep my medication? ?This drug is given in a hospital or clinic and will not be stored at home. ?NOTE: This sheet is a summary. It may not cover all possible  information. If you have questions about this medicine, talk to your doctor, pharmacist, or health care provider. ?? 2023 Elsevier/Gold Standard (2021-02-27 00:00:00) ? ?

## 2021-09-03 NOTE — Telephone Encounter (Signed)
Patient's daughter called in and left a message stated she check with all the pharmacy in [place] and they are all out of the Oxycodone 10 mg. I advice the patient's daughter the provider will send the prescription to [facility] pharmacy. She agree to come back and get the new prescription.

## 2021-09-03 NOTE — Progress Notes (Signed)
[facility] OFFICE PROGRESS NOTE   Diagnosis: Colon cancer  INTERVAL HISTORY:   Sabrina [name] returns for follow-up.  She was hospitalized 08/11/2021 through 08/17/2021 for pain control, management of constipation.  She was discharged to a nursing facility.  She is seen today prior to beginning treatment with Pembrolizumab.  She returned home from the nursing facility yesterday.  She fell last night.  She continues to have abdominal pain.  Oxycodone 10 mg is partially effective.  Poor oral intake.  She is losing weight.  She is in bed most of the time.  Objective:  Vital signs in last 24 hours:  Temperature 98.1, heart rate 101, respirations 18, blood pressure 113/67, weight 104 pounds    HEENT: No thrush or ulcers. Resp: Lungs clear bilaterally. Cardio: Regular rate and rhythm. GI: No hepatomegaly.  No mass. Vascular: No leg edema. Port-A-Cath without erythema.  Lab Results:  Lab Results  Component Value Date   WBC 11.3 (H) 09/03/2021   HGB 13.4 09/03/2021   HCT 41.7 09/03/2021   MCV 88.7 09/03/2021   PLT 154 09/03/2021   NEUTROABS 9.4 (H) 09/03/2021    Imaging:  No results found.  Medications: I have reviewed the patient's current medications.  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, lymphovascular invasion present including large vessel extramural invasion, loss of MLH1 and PMS2, MLH1 hyper methylation present Incomplete colonoscopy secondary to stricturing at the sigmoid colon Virtual colonoscopy 03/13/2020- mass centered at the medial wall of the cecum and terminal ileum, adjacent ileocolic mesenteric adenopathy, nonspecific 3 mm right lower lobe nodule PET 09/98/3382 hypermetabolic cecal/terminal ileum mass with adjacent enlarged and hypermetabolic ileocolic lymph nodes, previously noted segment 7 liver lesion-not hypermetabolic-  benign etiology favored, dominant left lower lobe pulmonary nodule not hypermetabolic, other tiny nodules not hypermetabolic and below PET sensitivity Cycle 1 FOLFOX 05/12/2020 Cycle 2 FOLFOX 05/26/2020 Cycle 3 FOLFOX 06/09/2020 Cycle 4 FOLFOX 06/23/2020 Cycle 5 FOLFOX 07/07/2020 Cycle 12 FOLFOX 10/14/2020, oxaliplatin dose reduced beginning with cycle 9 CT 12/04/2020-development of peripheral enhancing hypoattenuating mass in segment 5/8, necrotic porta hepatis lymphadenopathy, similar appearance of previously visualized nonhypermetabolic subcapsular segment 7 lesion Ultrasound-guided biopsy right liver lesion 12/22/2020-adenocarcinoma consistent with a colon primary PET scan 12/30/2020-hypermetabolic hepatic lesion and portal adenopathy-increased in size compared to 12/04/2020 CT, small hypermetabolic left periaortic node Foundation 1-MSI high, tumor mutation burden 31, K-ras wild-type Cycle 1 Pembrolizumab 01/15/2021 Cycle 1 FOLFIRI 02/16/2021 Cycle 2 FOLFIRI 03/02/2021-Emend and Udenyca added Cycle 3 FOLFIRI 03/17/2021 Cycle 4 FOLFIRI 03/31/2021 Cycle 5 FOLFIRI 04/15/2021 CT abdomen/pelvis 04/23/2021-mild decrease in size of right hepatic lobe metastases, mild decrease in porta hepatis and portacaval lymphadenopathy, mild increase in left periaortic lymphadenopathy Cycle 6 FOLFIRI 04/28/2021 Cycle 7 FOLFIRI 05/12/2021 Cycle 8 FOLFIRI 05/26/2021 Cycle 9 FOLFIRI 06/09/2021 Cycle 10 FOLFIRI 06/30/2021 CTs 07/10/2021-decrease in size of right hepatic lobe metastatic lesion and periportal and retroperitoneal nodal disease.  No new or progressive findings. 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, stage Ia, T1b, N0, ER positive, PR positive, HER2 positive status post a right lumpectomy and  adjuvant radiation/Taxol/Herceptin, anastrozole starting 01/25/2019 Diabetes Atrial fibrillation-maintained on Coumadin, Coumadin dose adjusted 03/02/2021 Hypothyroidism Ongoing tobacco use Family history of breast cancer-negative genetic testing Oxaliplatin neuropathy Intermittent upper abdominal discomfort-related to liver/portal metastases?-Improved Admission 08/04/2021 with abdomen/back pain and failure to thrive Admission 08/11/2021 with constipation  Disposition: Sabrina [name] has metastatic colon cancer.  Her performance status has declined significantly.  Dr. Benay Spice discussed a trial of Pembrolizumab versus supportive care with a hospice referral.  She would like to try the Pembrolizumab.  We reviewed potential toxicities associated with immunotherapy.  She agrees to proceed.  Labs from today reviewed.  Appear adequate for treatment.  She will continue Coumadin at the current dose.  She will increase oxycodone to 10 to 20 mg every 4 hours as needed.  She will return for Pembrolizumab and follow-up in 3 weeks.  We are available to see her sooner if needed.  Patient seen with Dr. Benay Spice.  Ned Card ANP/GNP-BC   09/03/2021  11:39 AM  This was a shared visit with Ned Card.  Sabrina [name] was interviewed and examined.  She has persistent abdominal pain.  Her performance status appears to have declined since discharge from the hospital.  We discussed treatment options including comfort/hospice care versus a trial of pembrolizumab.  We reviewed potential toxicities associated with pembrolizumab.  She would like to proceed with treatment today.  A treatment plan was entered.`  She will continue oxycodone for pain.  I was present for  greater than 50% of today's visit.  I performed medical decision making.  Julieanne Manson, MD

## 2021-09-04 ENCOUNTER — Ambulatory Visit (INDEPENDENT_AMBULATORY_CARE_PROVIDER_SITE_OTHER): Payer: Medicare Other | Admitting: Pharmacist

## 2021-09-04 ENCOUNTER — Telehealth: Payer: Self-pay

## 2021-09-04 ENCOUNTER — Encounter: Payer: Self-pay | Admitting: Hematology and Oncology

## 2021-09-04 DIAGNOSIS — Z7901 Long term (current) use of anticoagulants: Secondary | ICD-10-CM | POA: Diagnosis not present

## 2021-09-04 DIAGNOSIS — I480 Paroxysmal atrial fibrillation: Secondary | ICD-10-CM

## 2021-09-04 DIAGNOSIS — Z5181 Encounter for therapeutic drug level monitoring: Secondary | ICD-10-CM | POA: Diagnosis not present

## 2021-09-04 NOTE — Patient Instructions (Signed)
Description   Continue '10mg'$  daily except '5mg'$  on Mondays, Wednesdays and Saturdays. Recheck INR 3 weeks at [facility]. Coumadin Clinic# [phone].  **PT/INR drawn at [facility]**

## 2021-09-04 NOTE — Telephone Encounter (Signed)
Called and spoke to patient's daughter for first time chemo follow-up.  Daughter stated patient has been doing better, but did have one event of vomiting this am.  Patient took PO nausea meds and has been ok since.  Patient has also been taking her pain medications, which patient's daughter stated has been helping.  Patient's daughter stated patient has been sleeping more. Informed daughter that this can be due to the pain medications, the immunotherapy, and/or the current disease process.  Daughter verbalized understanding and agreed to relay information to patient.  Daughter instructed to call office if she had any additional questions or concerns.

## 2021-09-08 ENCOUNTER — Telehealth: Payer: Self-pay

## 2021-09-08 NOTE — Telephone Encounter (Signed)
Manus Gunning from [facility] called to have a order for palliative care. Dr Benay Spice agree to the order for palliative care.

## 2021-09-10 ENCOUNTER — Telehealth: Payer: Self-pay

## 2021-09-10 ENCOUNTER — Other Ambulatory Visit: Payer: Self-pay | Admitting: Nurse Practitioner

## 2021-09-10 DIAGNOSIS — C189 Malignant neoplasm of colon, unspecified: Secondary | ICD-10-CM

## 2021-09-10 MED ORDER — OXYCODONE HCL 10 MG PO TABS: 10.0000 mg | ORAL_TABLET | ORAL | 0 refills | Status: AC | PRN

## 2021-09-10 NOTE — Telephone Encounter (Signed)
Patient called in for refill of her pain medication. Place the requested on the NP's desk

## 2021-09-14 ENCOUNTER — Telehealth: Payer: Self-pay

## 2021-09-14 NOTE — Telephone Encounter (Signed)
The patient daughter call in and left a message stated Sabrina [name] decided to stop all chemo treatment and she will reach out to hospice for service. I called the patient daughter to see if we can help with anything for now. She stated no not at this time. I advice to Estill Bamberg I will let the provider know. I let Dr Benay Spice know and he stated that was fine and he would be her attend for hospice.

## 2021-09-16 ENCOUNTER — Telehealth: Payer: Self-pay

## 2021-09-16 NOTE — Telephone Encounter (Signed)
Sabrina [name] from [facility] called in for an order for hospice. She also ask about symptom management for the patient.  Per Dr. Benay Spice the order was giving and he was going to be the attend and symptom management was ok.

## 2021-09-20 ENCOUNTER — Other Ambulatory Visit: Payer: Self-pay | Admitting: Oncology

## 2021-09-24 ENCOUNTER — Inpatient Hospital Stay: Payer: Medicare Other

## 2021-09-24 ENCOUNTER — Inpatient Hospital Stay: Payer: Medicare Other | Admitting: Nurse Practitioner

## 2021-09-28 ENCOUNTER — Telehealth: Payer: Self-pay | Admitting: *Deleted

## 2021-09-28 NOTE — Telephone Encounter (Signed)
Called pt dtr, Estill Bamberg, and she confirmed the pt was no longer on warfarin. She states pt is now on Hospice Care and warfarin has been stopped. Will discontinue Anticoagulation track

## 2021-10-10 DEATH — deceased

## 2021-10-15 ENCOUNTER — Inpatient Hospital Stay: Payer: Medicare Other

## 2021-10-15 ENCOUNTER — Inpatient Hospital Stay: Payer: Medicare Other | Admitting: Oncology

## 2021-10-15 ENCOUNTER — Other Ambulatory Visit: Payer: Medicare Other

## 2021-10-15 ENCOUNTER — Ambulatory Visit: Payer: Medicare Other

## 2021-10-15 ENCOUNTER — Ambulatory Visit: Payer: Medicare Other | Admitting: Oncology

## 2022-06-20 IMAGING — DX DG ABDOMEN 2V
4 series · 4 of 4 positions shown · non-contrast
Comparison: 11/18/2020

CLINICAL DATA: Metastatic colon cancer, left-sided abdominal pain
and nausea

EXAM:
ABDOMEN - 2 VIEW

[abdomen erect (1 of 2)]
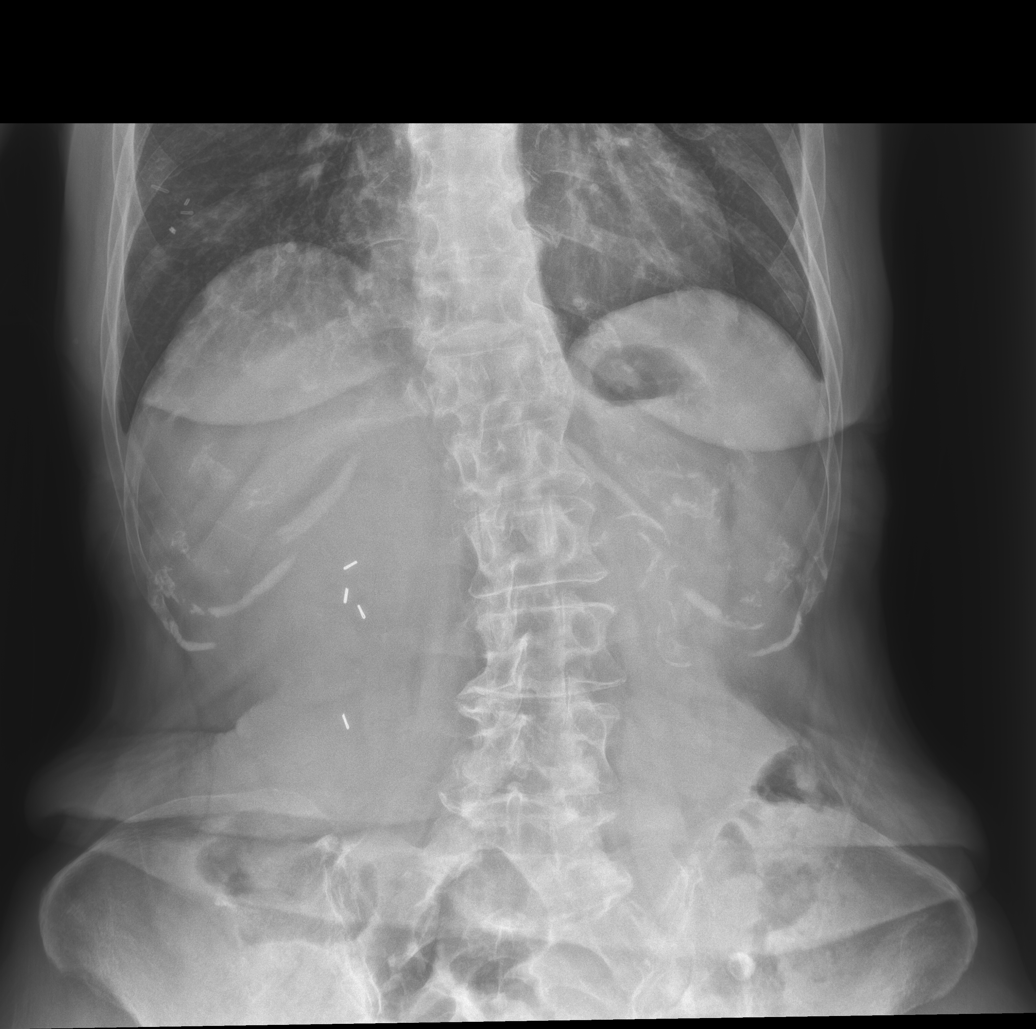

[abdomen supine (1 of 2)]
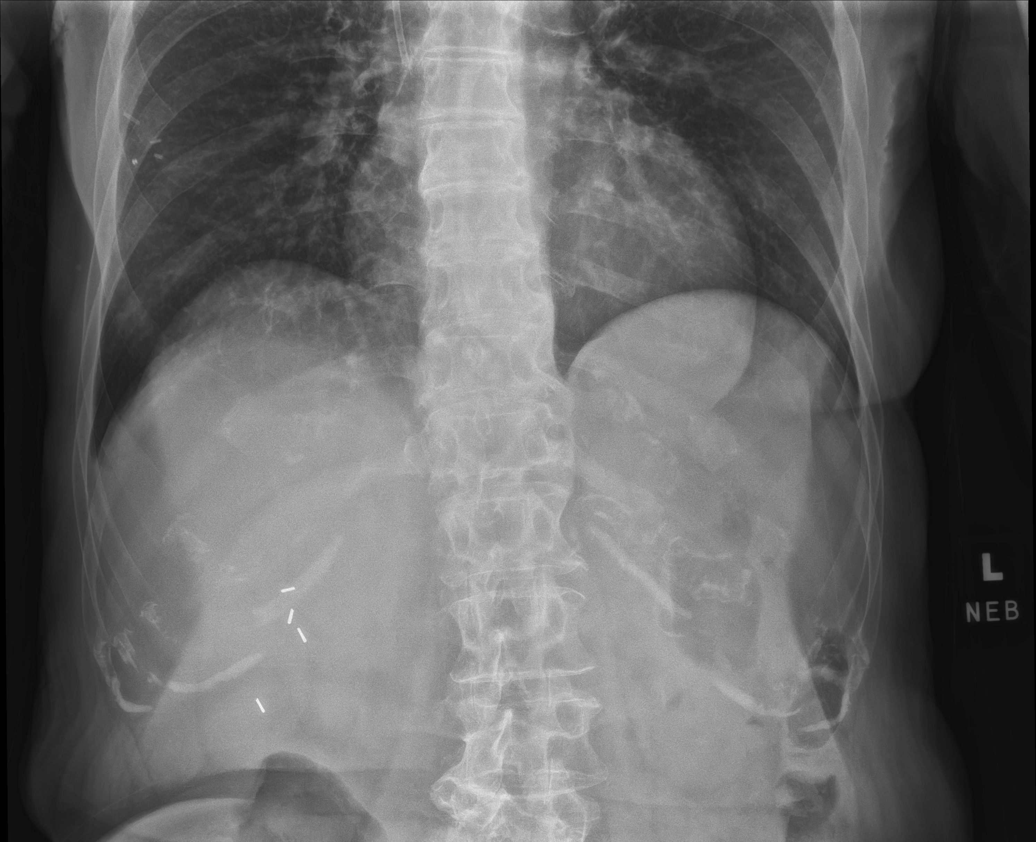

[abdomen supine (2 of 2)]
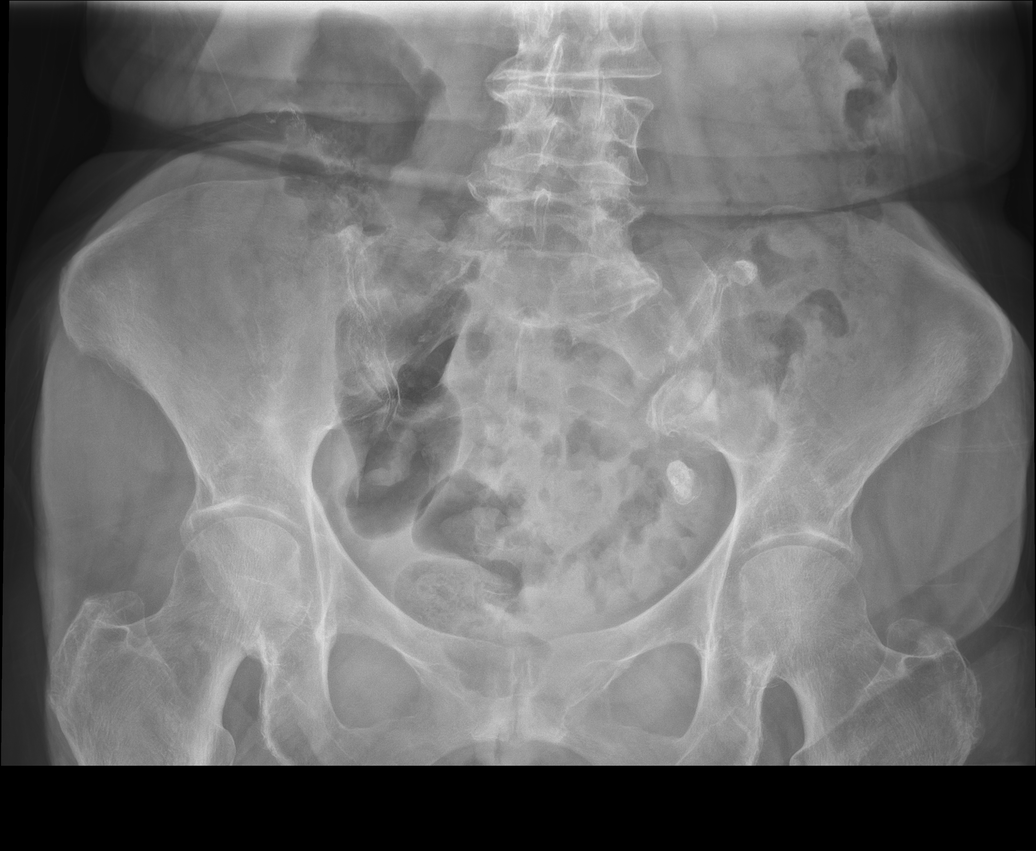

[abdomen erect (2 of 2)]
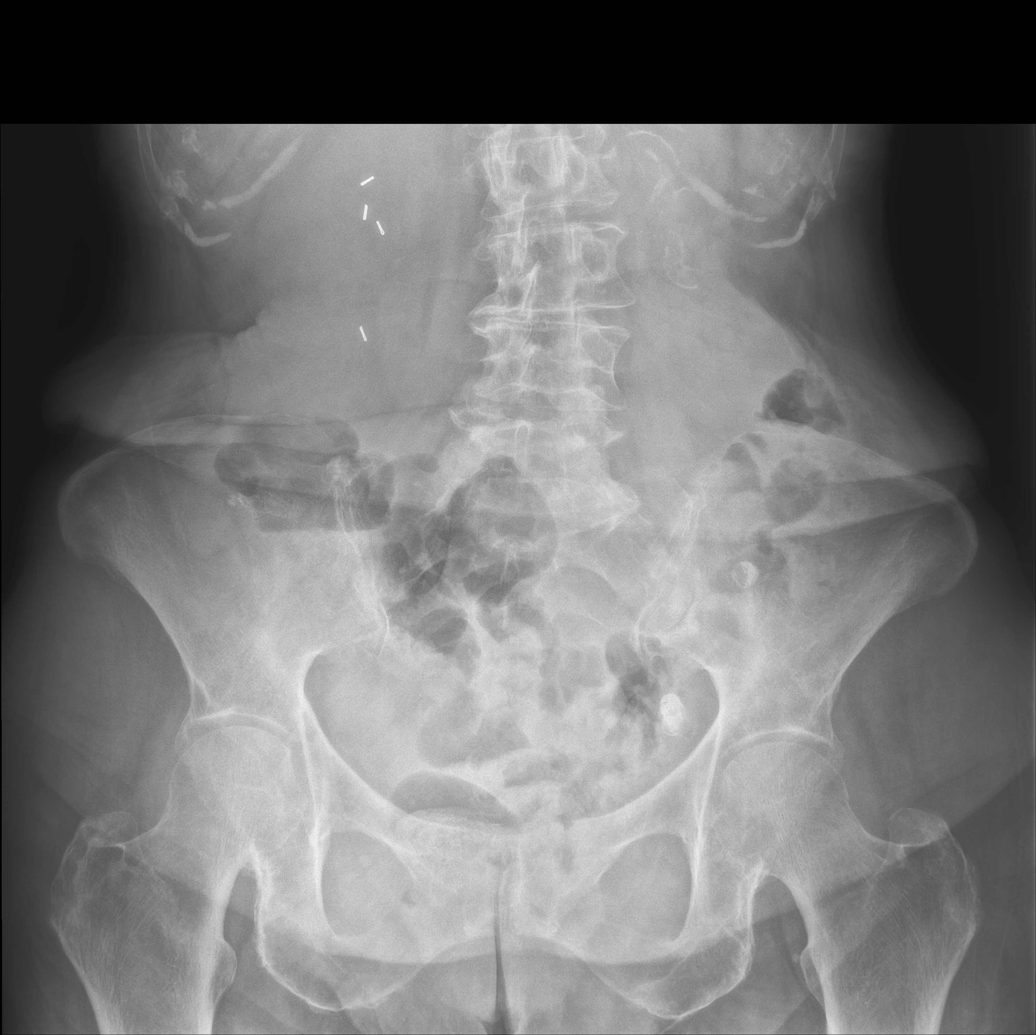

[4 of 4 positions shown; findings below may reference images not displayed]

FINDINGS: The bowel gas pattern is normal. There is no evidence of free air.
Surgical clips in the right upper quadrant and right hemiabdomen no
radio-opaque calculi or other significant radiographic abnormality
is seen.
IMPRESSION: Nonobstructive pattern of bowel gas.  No free air in the abdomen.

## 2022-06-21 IMAGING — CT CT ABD-PELV W/ CM
2 of 5 series · 15 of 46 positions shown, 17 images · IV contrast (APPLIED)
Comparison: CT AP 12/04/2020 and PET-CT 12/30/2020

CLINICAL DATA: Metastatic colon cancer. Assess for bowel
obstruction. Patient complains of abdominal pain and nausea.

EXAM:
CT ABDOMEN AND PELVIS WITH CONTRAST
TECHNIQUE: Multidetector CT imaging of the abdomen and pelvis was performed
using the standard protocol following bolus administration of
intravenous contrast.
CONTRAST:  80mL OMNIPAQUE IOHEXOL 300 MG/ML  SOLN

[Series 2: abd pel w · axial · 0.69mm/px · z∈[-268,+102]mm · 12 of 84 slices shown, 14 images]
[im 5/84  soft-tissue]
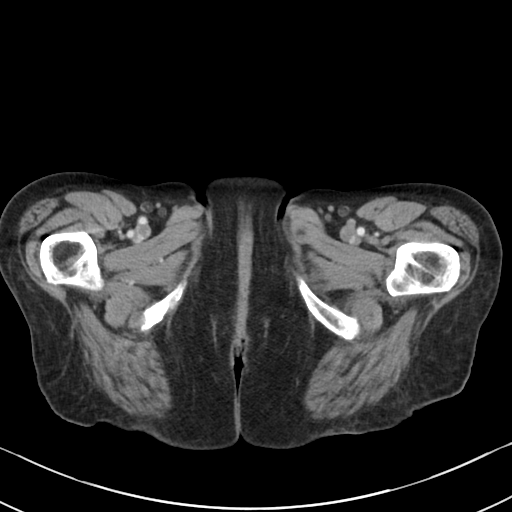
[im 5/84  bone]
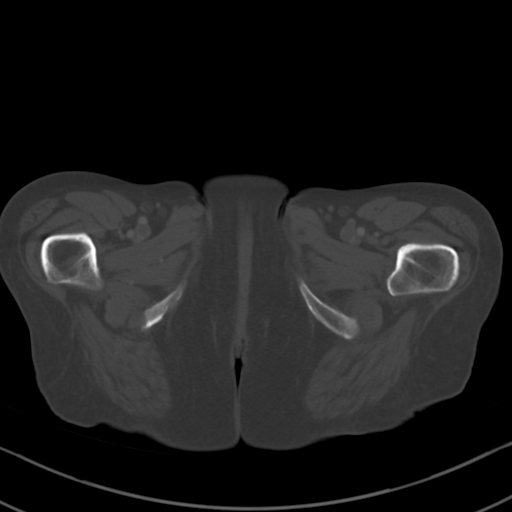
[im 13/84  soft-tissue]
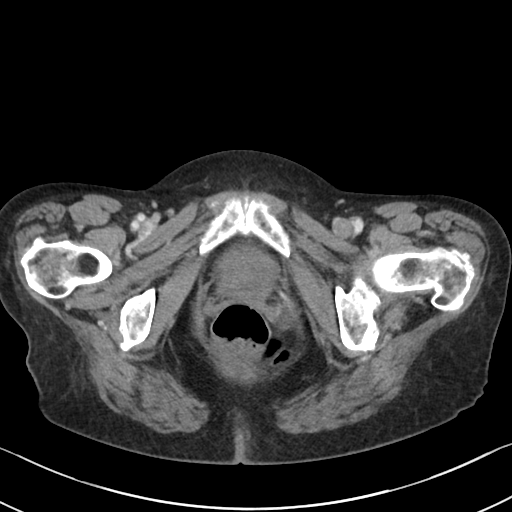
[im 17/84  soft-tissue]
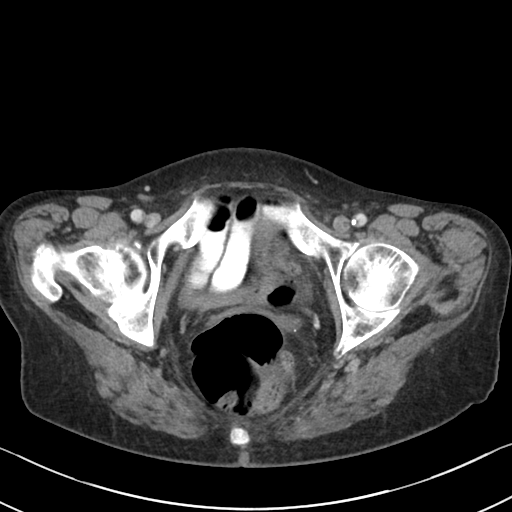
[im 25/84  soft-tissue]
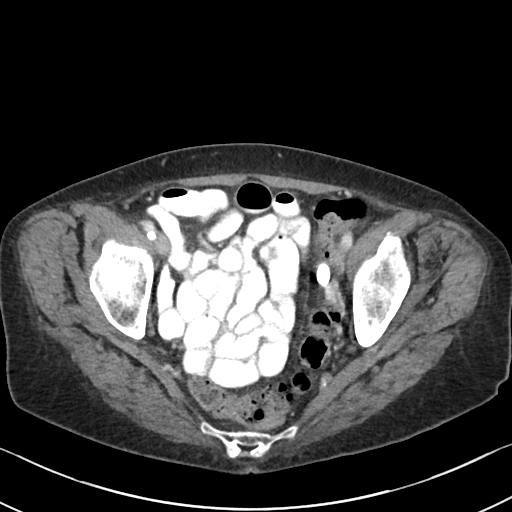
[im 34/84  soft-tissue]
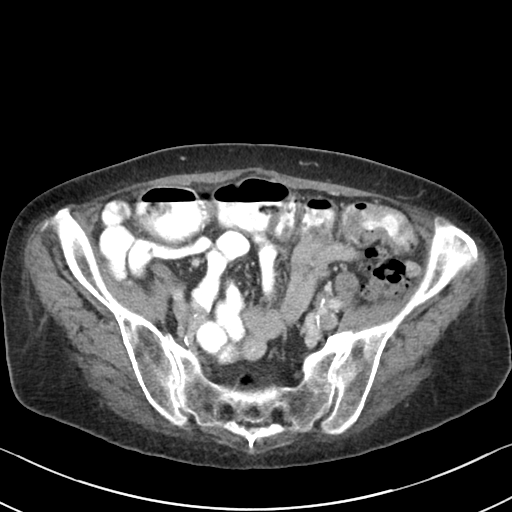
[im 38/84  soft-tissue]
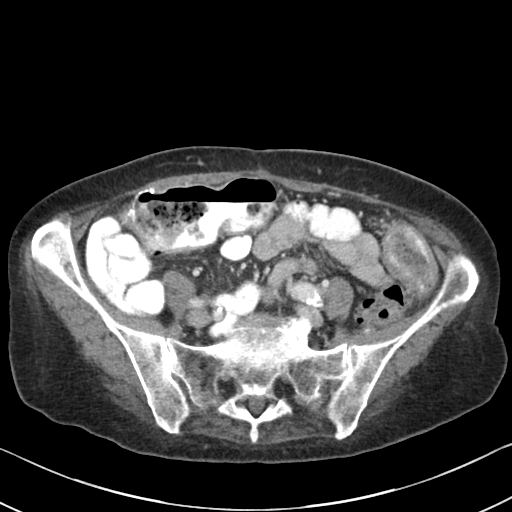
[im 46/84  soft-tissue]
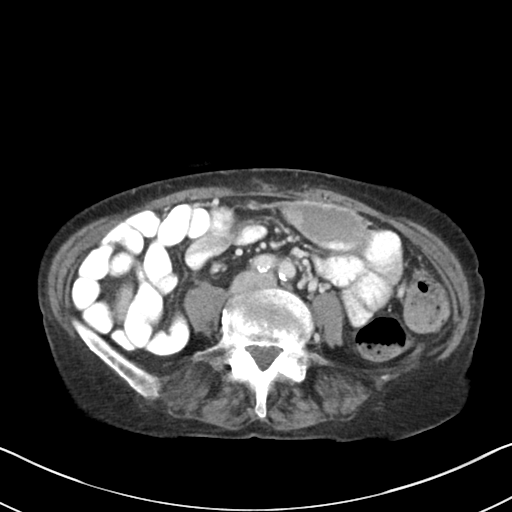
[im 50/84  soft-tissue]
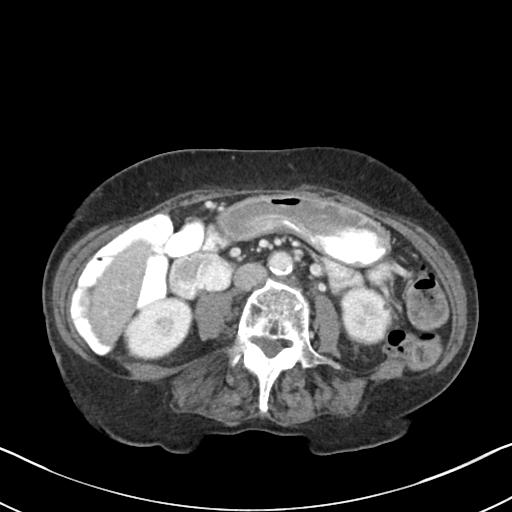
[im 59/84  soft-tissue]
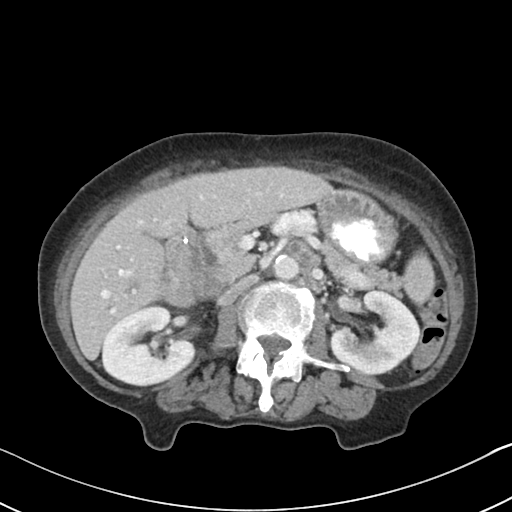
[im 59/84  bone]
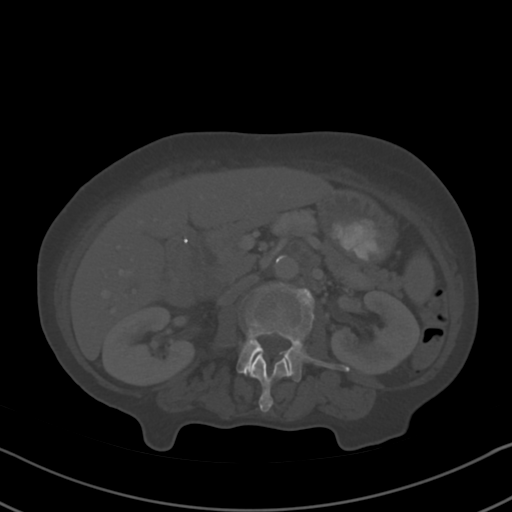
[im 67/84  soft-tissue]
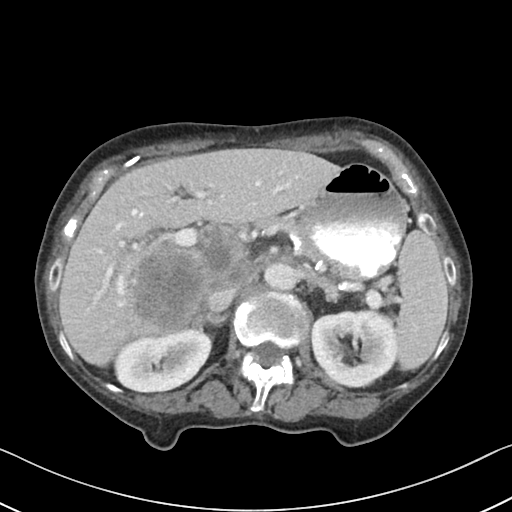
[im 71/84  soft-tissue]
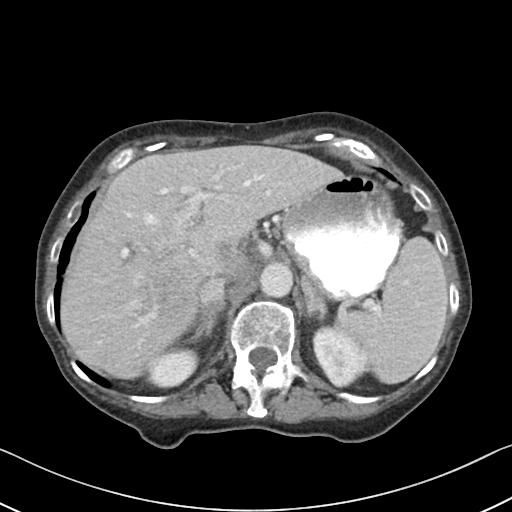
[im 79/84  soft-tissue]
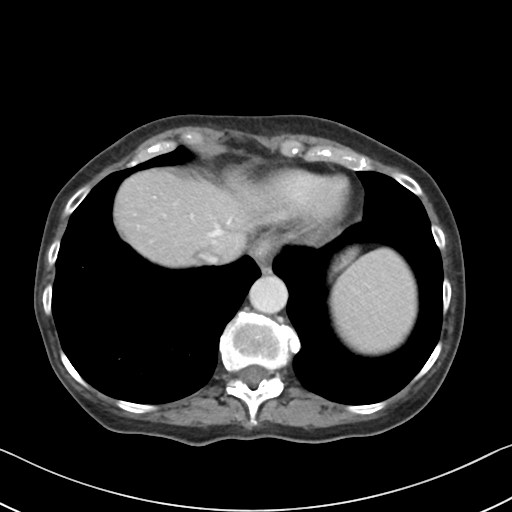

[Series 5: coronal · coronal · 0.71mm/px · 3 of 86 slices shown]
[im 29/86  soft-tissue]
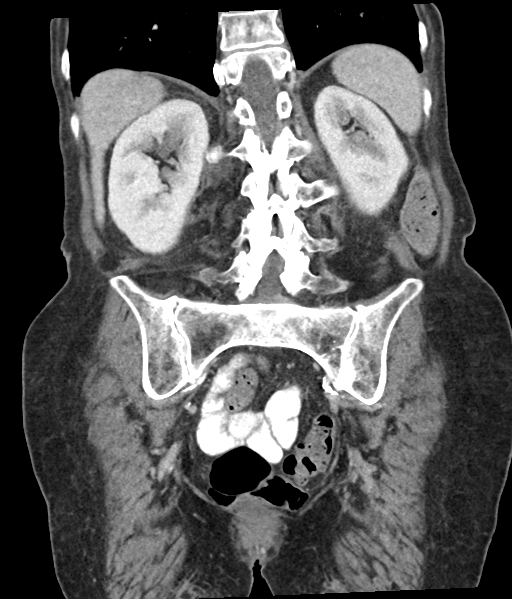
[im 38/86  soft-tissue]
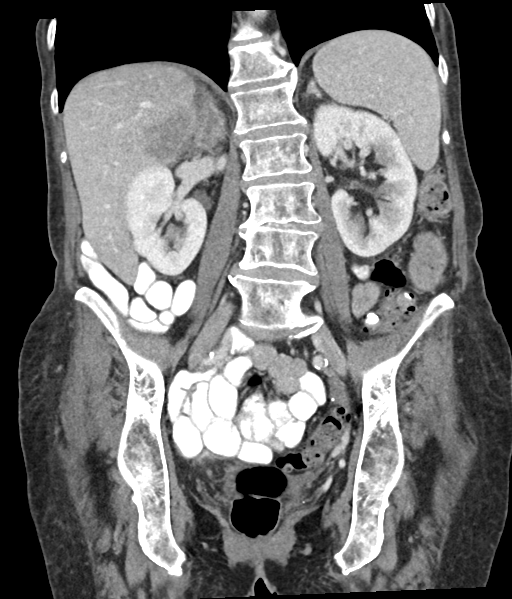
[im 48/86  soft-tissue]
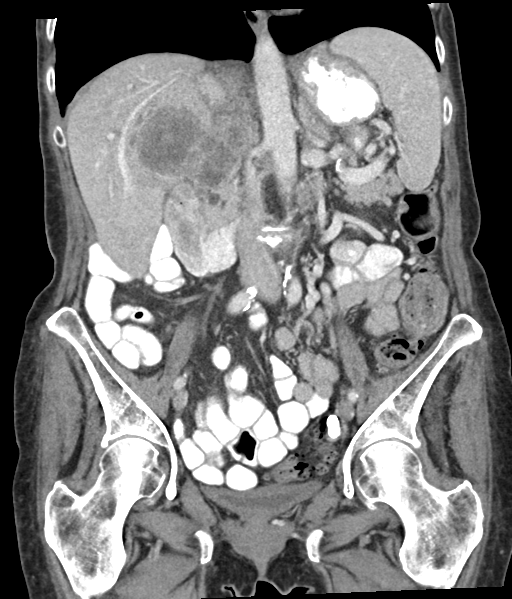

[15 of 46 positions shown; findings below may reference images not displayed]

FINDINGS: Lower chest: No acute abnormality.

Hepatobiliary: Mass within the posteromedial right hepatic lobe
measures 5.3 x 4.6 cm, image [DATE]. This is compared with 5.0 x
cm previously. This mass abuts the right portal vein and posterior
branch of the right portal vein. There is new, nonocclusive thrombus
identified within the right portal vein, image [DATE]. Near complete
occlusion of the right posterior branch of the portal vein noted,
image [DATE].

Pancreas: Unremarkable. No pancreatic ductal dilatation or
surrounding inflammatory changes.

Spleen: Normal in size without focal abnormality.

Adrenals/Urinary Tract: Adrenal glands are unremarkable. Kidneys are
normal, without renal calculi, focal lesion, or hydronephrosis.
Bladder is unremarkable.

Stomach/Bowel: Stomach appears nondistended. Previous right
hemicolectomy with enterocolonic anastomosis. No signs of
small-bowel obstruction. Enteric contrast material is identified
beyond the enterocolonic anastomosis to the level of the distal
transverse colon. Gas and stool noted from the splenic flexure to
the rectum. Distal colonic diverticulosis noted without signs of
acute inflammation.

Vascular/Lymphatic: Aortic atherosclerosis without signs of
aneurysm. Upper abdominal adenopathy is identified. Large portacaval
lymph node measures 4.7 x 3.7 cm, image [DATE]. This is compared with
3.3 x 3.9 cm previously. Porta hepatic node is enlarged measuring
4.0 x 2.6 cm. Previously 2.8 x 2.2 cm. Left periaortic lymph node
has enlarged measuring 2.3 x 1.2 cm, image [DATE]. Formally this
measured 1.3 x 0.8 cm.

Reproductive: Status post hysterectomy. No adnexal masses.

Other: No ascites or focal fluid collections.

Musculoskeletal: Degenerative disc disease noted within the lumbar
spine. Most advanced at L4-5. No acute or suspicious osseous
findings.
IMPRESSION: 1. No evidence for bowel obstruction. Enteric contrast material is
identified beyond the enterocolonic anastomosis to the level of the
distal transverse colon.
2. Since 12/30/2020 there has been interval increase in size of
right hepatic lobe metastasis and upper abdominal adenopathy.
3. New, nonocclusive thrombus identified within the right portal
vein. Near complete occlusion of the right posterior branch of the
portal vein noted, also new from previous exam.
4. Aortic Atherosclerosis ([id number]).

These results will be called to the ordering clinician or
representative by the Radiologist Assistant, and communication
documented in the PACS or [REDACTED].

## 2022-12-09 IMAGING — CT CT ABD-PELV W/ CM
2 of 5 series · 15 of 46 positions shown, 17 images · IV contrast (APPLIED)
Comparison: Multiple prior imaging studies. The most recent is
04/23/2021

CLINICAL DATA: Restaging metastatic colon cancer.

EXAM:
CT ABDOMEN AND PELVIS WITH CONTRAST
TECHNIQUE: Multidetector CT imaging of the abdomen and pelvis was performed
using the standard protocol following bolus administration of
intravenous contrast.

[Series 2: abd pel w · axial · 0.74mm/px · z∈[-518,-118]mm · 12 of 91 slices shown, 14 images]
[im 6/91  soft-tissue]
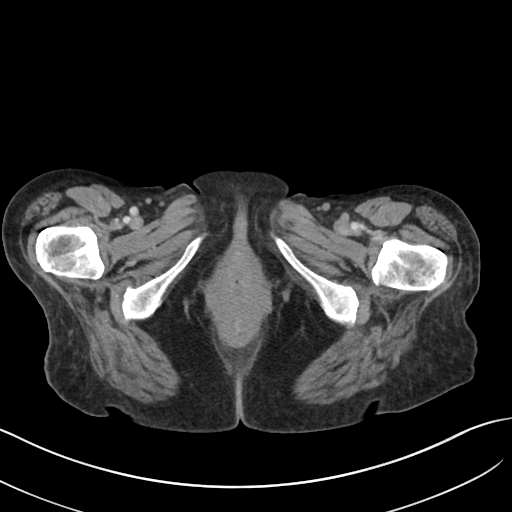
[im 6/91  bone]
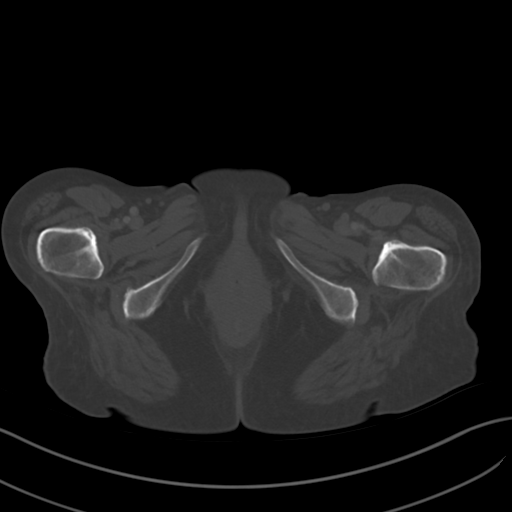
[im 16/91  soft-tissue]
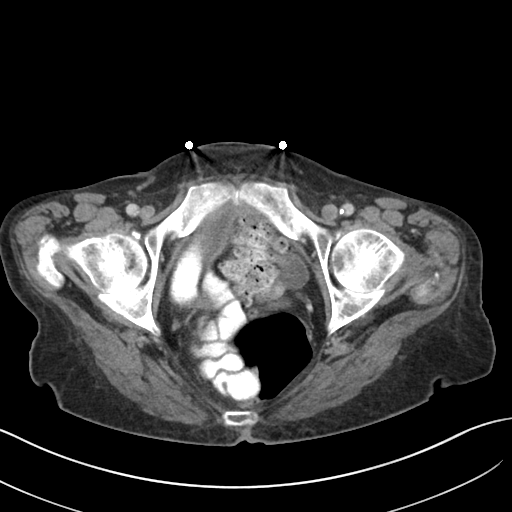
[im 21/91  soft-tissue]
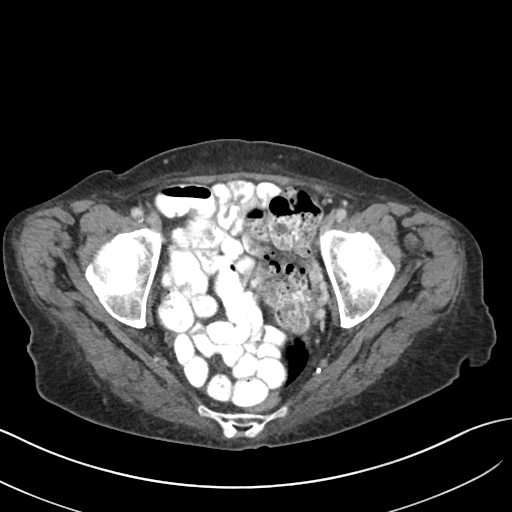
[im 26/91  soft-tissue]
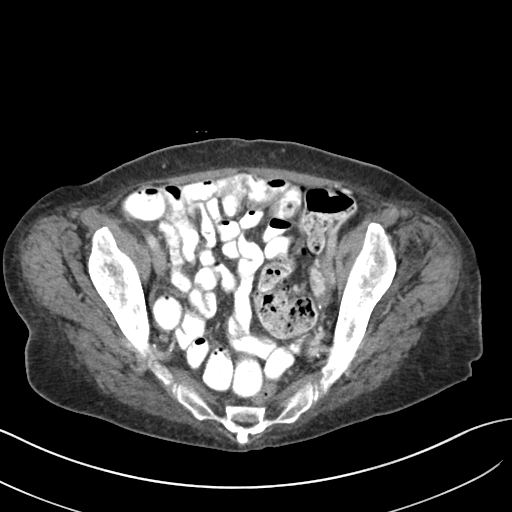
[im 36/91  soft-tissue]
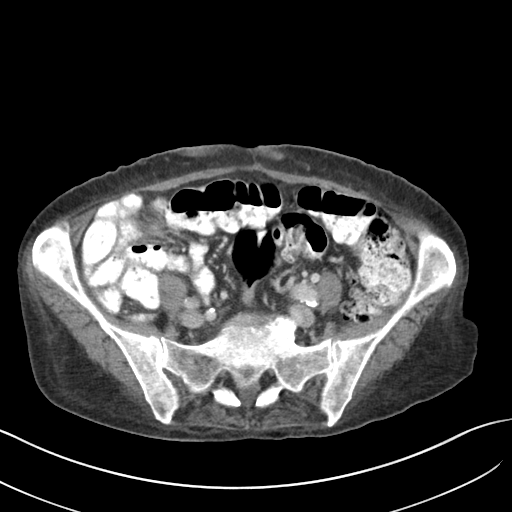
[im 41/91  soft-tissue]
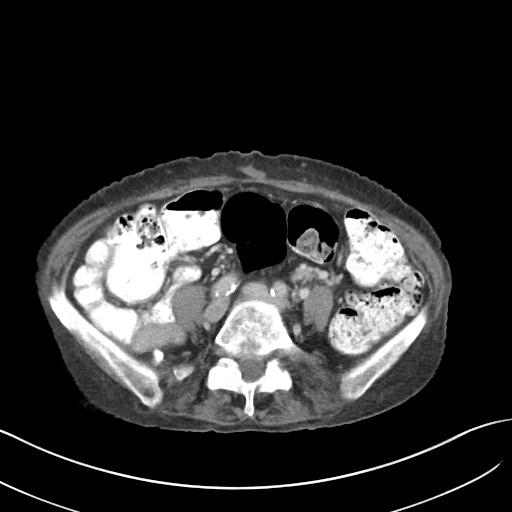
[im 51/91  soft-tissue]
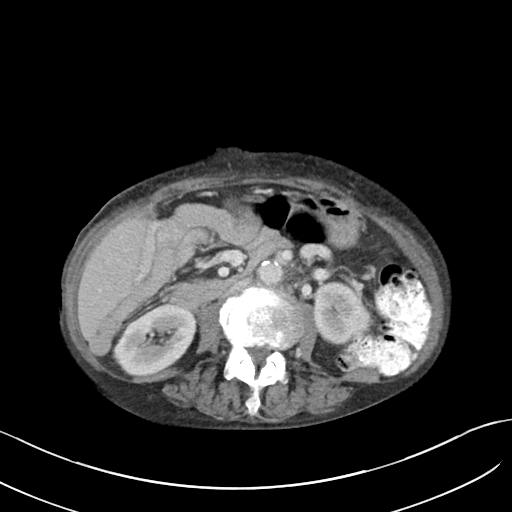
[im 56/91  soft-tissue]
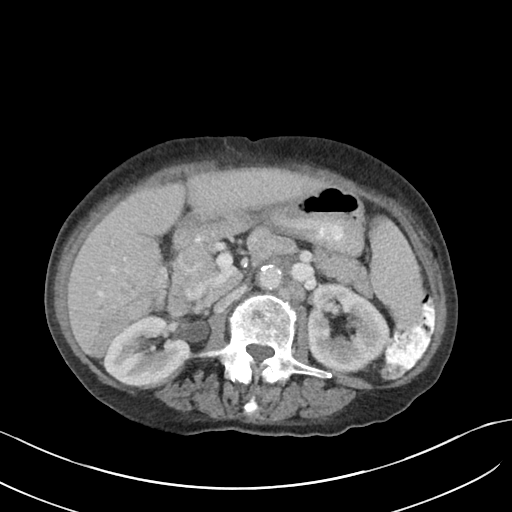
[im 66/91  soft-tissue]
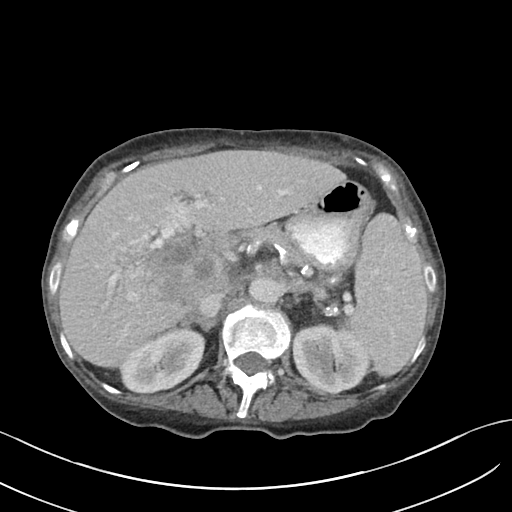
[im 66/91  bone]
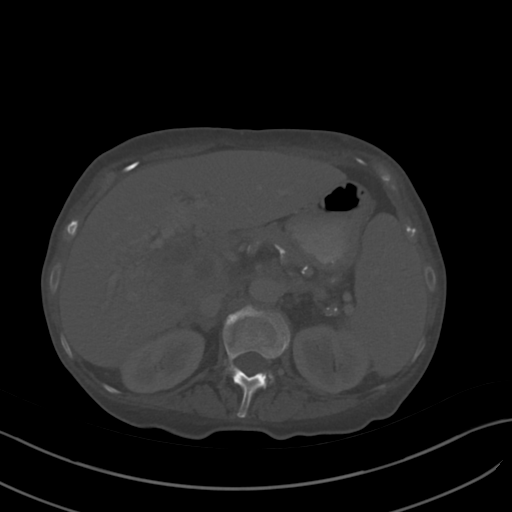
[im 71/91  soft-tissue]
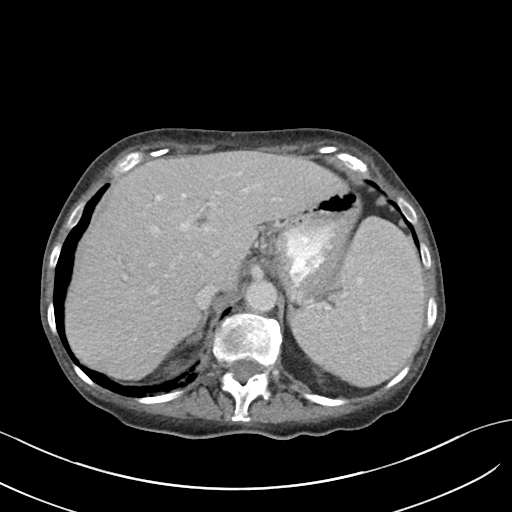
[im 76/91  soft-tissue]
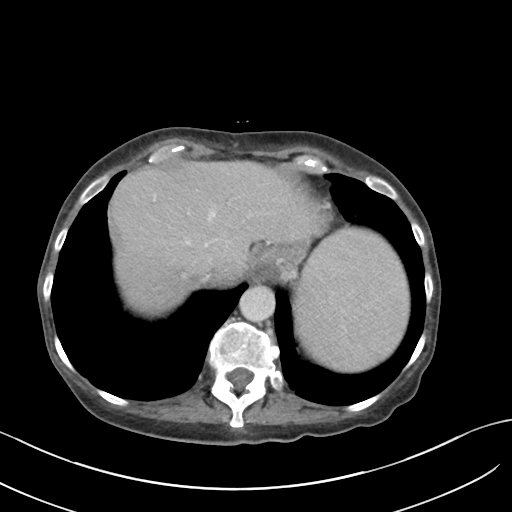
[im 86/91  soft-tissue]
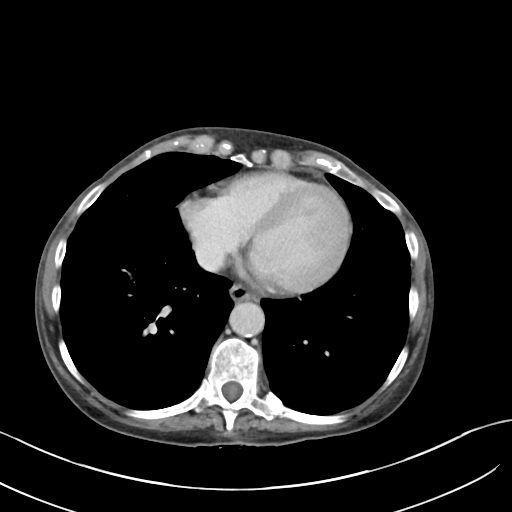

[Series 5: coronal · coronal · 0.61mm/px · 3 of 76 slices shown]
[im 26/76  soft-tissue]
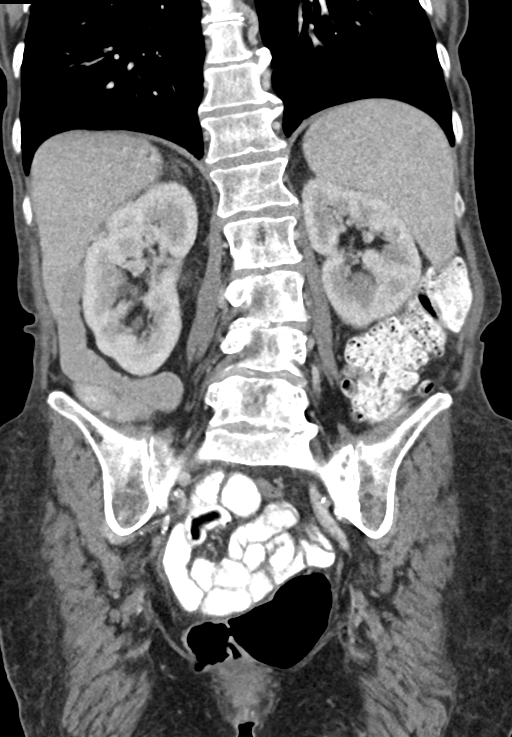
[im 34/76  soft-tissue]
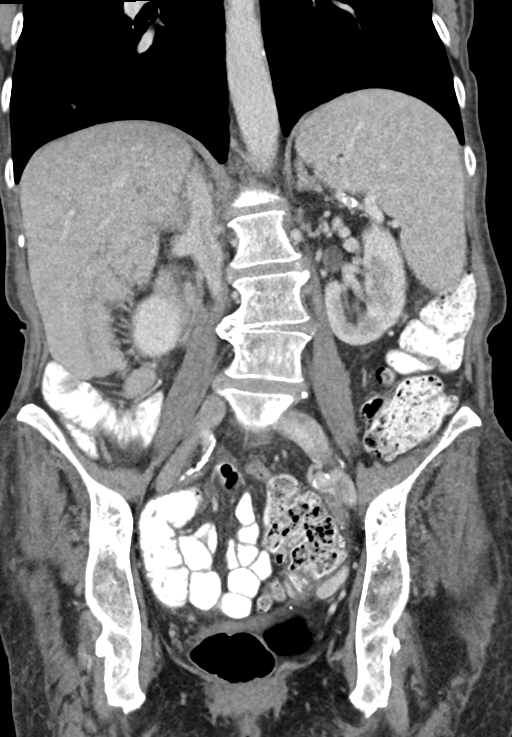
[im 42/76  soft-tissue]
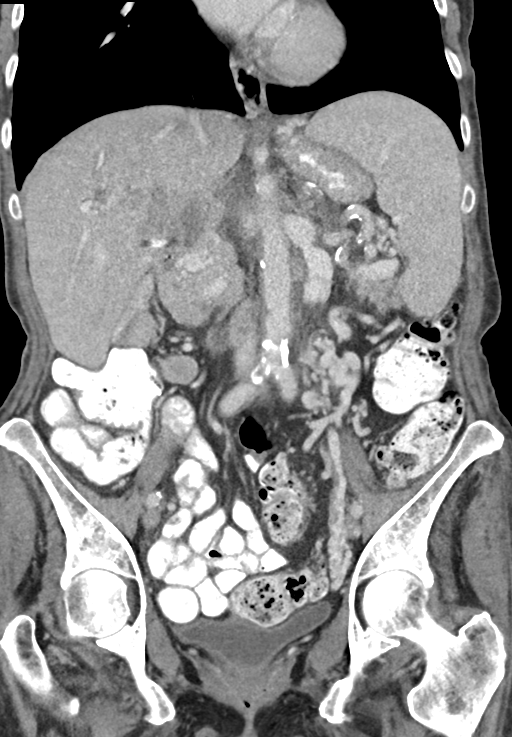

[15 of 46 positions shown; findings below may reference images not displayed]

RADIATION DOSE REDUCTION: This exam was performed according to the
departmental dose-optimization program which includes automated
exposure control, adjustment of the mA and/or kV according to
patient size and/or use of iterative reconstruction technique.

CONTRAST:  80mL OMNIPAQUE IOHEXOL 300 MG/ML  SOLN
FINDINGS: Lower chest: No worrisome pulmonary lesions to suggest metastatic
disease. Patchy E inflammatory changes or atelectasis in the right
lower lobe. No pleural effusions. The heart is normal in size. No
pericardial effusion. Small hiatal hernia.

Hepatobiliary: Stable portal vein thrombosis with cavernous
transformation. The right hepatic lobe metastatic lesion medially in
on image number [DATE] measures 3.3 x 2.3 cm and previously measured
5.5 x 3.9 cm. The adjacent necrotic portal adenopathy measures 3.6 x
3.1 cm and previously measured 4.7 x 3.5 cm when remeasured at the
same level.

No new hepatic lesions.

Pancreas: No mass, inflammation or ductal dilatation.

Spleen: Stable splenomegaly likely related to the portal vein
thrombosis. Spleen measures 14 x 12.5 x 10.0 cm.

Adrenals/Urinary Tract: The adrenal glands and kidneys are
unremarkable. No renal lesions or hydronephrosis. The bladder is
unremarkable.

Stomach/Bowel: The stomach, duodenum, small bowel and colon are
unremarkable.

Vascular/Lymphatic: Stable moderate atherosclerotic calcifications
involving the aorta and branch vessels but no aneurysm or
dissection.

Slight interval decrease in the periportal necrotic nodal mass as
detailed above. The retroperitoneal nodal disease on image number
33/2 measures 2.7 x 2.1 cm and previously measured 3.2 x 2.4 cm.
Left para-aortic nodal disease on image 36/2 measures 18 mm and
previously measured 21 mm.

Reproductive: Surgically absent.

Other: No pelvic mass or adenopathy. No free pelvic fluid
collections. No inguinal mass or adenopathy. No abdominal wall
hernia or subcutaneous lesions.

Musculoskeletal: No significant bony findings.
IMPRESSION: 1. Interval decrease in size of the right hepatic lobe metastatic
lesion and periportal and retroperitoneal nodal disease. No new or
progressive findings.
2. Stable portal vein thrombosis with cavernous transformation.
3. No new hepatic lesions.
4. Stable splenomegaly.
5. Patchy inflammatory changes or atelectasis in the right lower
lobe.

Aortic Atherosclerosis ([id number]).

## 2023-01-25 NOTE — Telephone Encounter (Signed)
TC

## 2023-03-02 NOTE — Telephone Encounter (Signed)
Telephone call
